# Patient Record
Sex: Female | Born: 1950 | Race: White | Hispanic: No | Marital: Married | Smoking: Never smoker
Health system: Southern US, Community
[De-identification: ages and names within clinical notes are randomized; demographics above are authoritative.]

## PROBLEM LIST (undated history)

## (undated) DIAGNOSIS — G5600 Carpal tunnel syndrome, unspecified upper limb: Secondary | ICD-10-CM

## (undated) DIAGNOSIS — I2510 Atherosclerotic heart disease of native coronary artery without angina pectoris: Secondary | ICD-10-CM

## (undated) DIAGNOSIS — IMO0002 Reserved for concepts with insufficient information to code with codable children: Secondary | ICD-10-CM

## (undated) DIAGNOSIS — E669 Obesity, unspecified: Secondary | ICD-10-CM

## (undated) DIAGNOSIS — Z9181 History of falling: Secondary | ICD-10-CM

## (undated) DIAGNOSIS — K7689 Other specified diseases of liver: Secondary | ICD-10-CM

## (undated) DIAGNOSIS — M810 Age-related osteoporosis without current pathological fracture: Secondary | ICD-10-CM

## (undated) DIAGNOSIS — C4490 Unspecified malignant neoplasm of skin, unspecified: Secondary | ICD-10-CM

## (undated) DIAGNOSIS — T7840XA Allergy, unspecified, initial encounter: Secondary | ICD-10-CM

## (undated) DIAGNOSIS — R269 Unspecified abnormalities of gait and mobility: Secondary | ICD-10-CM

## (undated) DIAGNOSIS — E785 Hyperlipidemia, unspecified: Secondary | ICD-10-CM

## (undated) DIAGNOSIS — D126 Benign neoplasm of colon, unspecified: Secondary | ICD-10-CM

## (undated) DIAGNOSIS — R51 Headache: Secondary | ICD-10-CM

## (undated) DIAGNOSIS — K5790 Diverticulosis of intestine, part unspecified, without perforation or abscess without bleeding: Secondary | ICD-10-CM

## (undated) DIAGNOSIS — J189 Pneumonia, unspecified organism: Secondary | ICD-10-CM

## (undated) DIAGNOSIS — G709 Myoneural disorder, unspecified: Secondary | ICD-10-CM

## (undated) DIAGNOSIS — K589 Irritable bowel syndrome without diarrhea: Secondary | ICD-10-CM

## (undated) DIAGNOSIS — K219 Gastro-esophageal reflux disease without esophagitis: Secondary | ICD-10-CM

## (undated) DIAGNOSIS — R413 Other amnesia: Secondary | ICD-10-CM

## (undated) DIAGNOSIS — K644 Residual hemorrhoidal skin tags: Secondary | ICD-10-CM

## (undated) DIAGNOSIS — F419 Anxiety disorder, unspecified: Secondary | ICD-10-CM

## (undated) DIAGNOSIS — H6692 Otitis media, unspecified, left ear: Secondary | ICD-10-CM

## (undated) DIAGNOSIS — M533 Sacrococcygeal disorders, not elsewhere classified: Secondary | ICD-10-CM

## (undated) DIAGNOSIS — I10 Essential (primary) hypertension: Secondary | ICD-10-CM

## (undated) DIAGNOSIS — G8929 Other chronic pain: Secondary | ICD-10-CM

## (undated) DIAGNOSIS — R011 Cardiac murmur, unspecified: Secondary | ICD-10-CM

## (undated) DIAGNOSIS — M1990 Unspecified osteoarthritis, unspecified site: Secondary | ICD-10-CM

## (undated) DIAGNOSIS — R519 Headache, unspecified: Secondary | ICD-10-CM

## (undated) HISTORY — DX: Other amnesia: R41.3

## (undated) HISTORY — PX: CARPAL TUNNEL RELEASE: SHX101

## (undated) HISTORY — DX: Essential (primary) hypertension: I10

## (undated) HISTORY — DX: Hyperlipidemia, unspecified: E78.5

## (undated) HISTORY — DX: Diverticulosis of intestine, part unspecified, without perforation or abscess without bleeding: K57.90

## (undated) HISTORY — DX: Allergy, unspecified, initial encounter: T78.40XA

## (undated) HISTORY — DX: Irritable bowel syndrome, unspecified: K58.9

## (undated) HISTORY — DX: Atherosclerotic heart disease of native coronary artery without angina pectoris: I25.10

## (undated) HISTORY — DX: Other chronic pain: G89.29

## (undated) HISTORY — DX: Unspecified osteoarthritis, unspecified site: M19.90

## (undated) HISTORY — DX: Headache: R51

## (undated) HISTORY — DX: Reserved for concepts with insufficient information to code with codable children: IMO0002

## (undated) HISTORY — DX: Myoneural disorder, unspecified: G70.9

## (undated) HISTORY — DX: Obesity, unspecified: E66.9

## (undated) HISTORY — DX: Gastro-esophageal reflux disease without esophagitis: K21.9

## (undated) HISTORY — DX: Carpal tunnel syndrome, unspecified upper limb: G56.00

## (undated) HISTORY — DX: History of falling: Z91.81

## (undated) HISTORY — DX: Residual hemorrhoidal skin tags: K64.4

## (undated) HISTORY — DX: Benign neoplasm of colon, unspecified: D12.6

## (undated) HISTORY — DX: Other specified diseases of liver: K76.89

## (undated) HISTORY — DX: Age-related osteoporosis without current pathological fracture: M81.0

## (undated) HISTORY — DX: Cardiac murmur, unspecified: R01.1

## (undated) HISTORY — DX: Anxiety disorder, unspecified: F41.9

## (undated) HISTORY — DX: Headache, unspecified: R51.9

## (undated) HISTORY — DX: Unspecified abnormalities of gait and mobility: R26.9

## (undated) HISTORY — PX: LAPAROSCOPY: SHX197

## (undated) HISTORY — DX: Unspecified malignant neoplasm of skin, unspecified: C44.90

## (undated) HISTORY — PX: SKIN SURGERY: SHX2413

## (undated) HISTORY — DX: Pneumonia, unspecified organism: J18.9

---
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
## 1998-06-29 ENCOUNTER — Ambulatory Visit (HOSPITAL_COMMUNITY): Admission: RE | Admit: 1998-06-29 | Discharge: 1998-06-29 | Payer: Self-pay | Admitting: Obstetrics and Gynecology

## 1999-06-16 ENCOUNTER — Other Ambulatory Visit: Admission: RE | Admit: 1999-06-16 | Discharge: 1999-06-16 | Payer: Self-pay | Admitting: Obstetrics and Gynecology

## 1999-09-08 ENCOUNTER — Other Ambulatory Visit: Admission: RE | Admit: 1999-09-08 | Discharge: 1999-09-08 | Payer: Self-pay | Admitting: Obstetrics and Gynecology

## 1999-09-13 ENCOUNTER — Encounter: Payer: Self-pay | Admitting: Family Medicine

## 1999-09-13 ENCOUNTER — Ambulatory Visit (HOSPITAL_COMMUNITY): Admission: RE | Admit: 1999-09-13 | Discharge: 1999-09-13 | Payer: Self-pay | Admitting: Family Medicine

## 1999-09-18 ENCOUNTER — Encounter: Payer: Self-pay | Admitting: Cardiology

## 1999-09-18 ENCOUNTER — Ambulatory Visit (HOSPITAL_COMMUNITY): Admission: RE | Admit: 1999-09-18 | Discharge: 1999-09-18 | Payer: Self-pay | Admitting: Cardiology

## 1999-09-21 ENCOUNTER — Ambulatory Visit (HOSPITAL_COMMUNITY): Admission: RE | Admit: 1999-09-21 | Discharge: 1999-09-21 | Payer: Self-pay | Admitting: Family Medicine

## 1999-09-21 ENCOUNTER — Encounter: Payer: Self-pay | Admitting: Family Medicine

## 1999-10-10 ENCOUNTER — Encounter: Payer: Self-pay | Admitting: Neurosurgery

## 1999-10-10 ENCOUNTER — Ambulatory Visit (HOSPITAL_COMMUNITY): Admission: RE | Admit: 1999-10-10 | Discharge: 1999-10-10 | Payer: Self-pay | Admitting: Neurosurgery

## 1999-11-10 ENCOUNTER — Encounter: Payer: Self-pay | Admitting: Family Medicine

## 1999-11-10 ENCOUNTER — Ambulatory Visit (HOSPITAL_COMMUNITY): Admission: RE | Admit: 1999-11-10 | Discharge: 1999-11-10 | Payer: Self-pay | Admitting: Family Medicine

## 2000-03-25 ENCOUNTER — Ambulatory Visit (HOSPITAL_COMMUNITY): Admission: RE | Admit: 2000-03-25 | Discharge: 2000-03-25 | Payer: Self-pay | Admitting: Family Medicine

## 2000-03-25 ENCOUNTER — Encounter: Payer: Self-pay | Admitting: Family Medicine

## 2000-08-06 ENCOUNTER — Other Ambulatory Visit: Admission: RE | Admit: 2000-08-06 | Discharge: 2000-08-06 | Payer: Self-pay | Admitting: Obstetrics and Gynecology

## 2000-09-30 ENCOUNTER — Ambulatory Visit (HOSPITAL_COMMUNITY): Admission: RE | Admit: 2000-09-30 | Discharge: 2000-09-30 | Payer: Self-pay | Admitting: Family Medicine

## 2000-09-30 ENCOUNTER — Encounter: Payer: Self-pay | Admitting: Family Medicine

## 2001-12-16 ENCOUNTER — Other Ambulatory Visit: Admission: RE | Admit: 2001-12-16 | Discharge: 2001-12-16 | Payer: Self-pay | Admitting: Obstetrics and Gynecology

## 2002-02-09 ENCOUNTER — Encounter: Payer: Self-pay | Admitting: Emergency Medicine

## 2002-02-09 ENCOUNTER — Inpatient Hospital Stay (HOSPITAL_COMMUNITY): Admission: EM | Admit: 2002-02-09 | Discharge: 2002-02-11 | Payer: Self-pay | Admitting: Emergency Medicine

## 2002-02-11 ENCOUNTER — Encounter: Payer: Self-pay | Admitting: Family Medicine

## 2002-02-17 ENCOUNTER — Encounter: Admission: RE | Admit: 2002-02-17 | Discharge: 2002-02-17 | Payer: Self-pay | Admitting: Family Medicine

## 2002-07-03 ENCOUNTER — Encounter: Admission: RE | Admit: 2002-07-03 | Discharge: 2002-10-01 | Payer: Self-pay | Admitting: Otolaryngology

## 2002-11-26 ENCOUNTER — Encounter: Payer: Self-pay | Admitting: Neurology

## 2002-11-26 ENCOUNTER — Ambulatory Visit (HOSPITAL_COMMUNITY): Admission: RE | Admit: 2002-11-26 | Discharge: 2002-11-26 | Payer: Self-pay | Admitting: Neurology

## 2003-03-23 ENCOUNTER — Encounter: Payer: Self-pay | Admitting: Obstetrics and Gynecology

## 2003-03-23 ENCOUNTER — Ambulatory Visit (HOSPITAL_COMMUNITY): Admission: RE | Admit: 2003-03-23 | Discharge: 2003-03-23 | Payer: Self-pay | Admitting: Obstetrics and Gynecology

## 2003-07-02 ENCOUNTER — Encounter (INDEPENDENT_AMBULATORY_CARE_PROVIDER_SITE_OTHER): Payer: Self-pay | Admitting: *Deleted

## 2003-07-02 ENCOUNTER — Ambulatory Visit (HOSPITAL_COMMUNITY): Admission: RE | Admit: 2003-07-02 | Discharge: 2003-07-02 | Payer: Self-pay | Admitting: Internal Medicine

## 2003-09-07 ENCOUNTER — Encounter: Admission: RE | Admit: 2003-09-07 | Discharge: 2003-10-05 | Payer: Self-pay | Admitting: Orthopedic Surgery

## 2004-11-27 ENCOUNTER — Inpatient Hospital Stay (HOSPITAL_COMMUNITY): Admission: EM | Admit: 2004-11-27 | Discharge: 2004-11-30 | Payer: Self-pay | Admitting: Emergency Medicine

## 2004-11-27 ENCOUNTER — Ambulatory Visit: Payer: Self-pay | Admitting: Cardiology

## 2004-12-04 ENCOUNTER — Ambulatory Visit: Payer: Self-pay

## 2005-01-03 ENCOUNTER — Ambulatory Visit: Payer: Self-pay | Admitting: Cardiology

## 2005-04-06 ENCOUNTER — Ambulatory Visit: Payer: Self-pay | Admitting: Cardiology

## 2005-11-05 ENCOUNTER — Ambulatory Visit: Payer: Self-pay | Admitting: Cardiology

## 2006-02-07 ENCOUNTER — Ambulatory Visit (HOSPITAL_COMMUNITY): Admission: RE | Admit: 2006-02-07 | Discharge: 2006-02-07 | Payer: Self-pay | Admitting: Obstetrics and Gynecology

## 2006-05-02 ENCOUNTER — Ambulatory Visit: Payer: Self-pay | Admitting: Cardiology

## 2007-03-12 ENCOUNTER — Emergency Department (HOSPITAL_COMMUNITY): Admission: EM | Admit: 2007-03-12 | Discharge: 2007-03-12 | Payer: Self-pay | Admitting: Emergency Medicine

## 2007-04-21 ENCOUNTER — Encounter: Admission: RE | Admit: 2007-04-21 | Discharge: 2007-05-19 | Payer: Self-pay | Admitting: Orthopaedic Surgery

## 2007-07-09 ENCOUNTER — Ambulatory Visit: Payer: Self-pay | Admitting: Cardiology

## 2008-10-19 ENCOUNTER — Ambulatory Visit: Payer: Self-pay | Admitting: Cardiology

## 2008-12-02 ENCOUNTER — Ambulatory Visit: Payer: Self-pay | Admitting: Internal Medicine

## 2008-12-15 ENCOUNTER — Ambulatory Visit: Payer: Self-pay | Admitting: Internal Medicine

## 2008-12-15 HISTORY — PX: COLONOSCOPY: SHX5424

## 2009-04-11 ENCOUNTER — Encounter: Payer: Self-pay | Admitting: Cardiology

## 2009-09-19 ENCOUNTER — Encounter: Payer: Self-pay | Admitting: Cardiology

## 2010-01-04 ENCOUNTER — Encounter: Admission: RE | Admit: 2010-01-04 | Discharge: 2010-01-04 | Payer: Self-pay | Admitting: Internal Medicine

## 2010-01-04 ENCOUNTER — Encounter: Payer: Self-pay | Admitting: Cardiology

## 2010-01-04 ENCOUNTER — Encounter: Payer: Self-pay | Admitting: Internal Medicine

## 2010-01-05 ENCOUNTER — Encounter: Payer: Self-pay | Admitting: Internal Medicine

## 2010-01-09 ENCOUNTER — Encounter (INDEPENDENT_AMBULATORY_CARE_PROVIDER_SITE_OTHER): Payer: Self-pay | Admitting: *Deleted

## 2010-02-08 ENCOUNTER — Ambulatory Visit: Payer: Self-pay | Admitting: Internal Medicine

## 2010-02-28 ENCOUNTER — Telehealth: Payer: Self-pay | Admitting: Internal Medicine

## 2010-03-07 ENCOUNTER — Telehealth: Payer: Self-pay | Admitting: Internal Medicine

## 2010-03-27 ENCOUNTER — Encounter: Payer: Self-pay | Admitting: Internal Medicine

## 2010-04-05 ENCOUNTER — Encounter: Payer: Self-pay | Admitting: Cardiology

## 2010-04-06 ENCOUNTER — Encounter: Admission: RE | Admit: 2010-04-06 | Discharge: 2010-04-06 | Payer: Self-pay | Admitting: Family Medicine

## 2010-04-14 ENCOUNTER — Encounter: Admission: RE | Admit: 2010-04-14 | Discharge: 2010-04-14 | Payer: Self-pay | Admitting: Family Medicine

## 2010-07-28 ENCOUNTER — Encounter: Payer: Self-pay | Admitting: Cardiology

## 2011-01-16 ENCOUNTER — Encounter: Payer: Self-pay | Admitting: Cardiology

## 2011-01-18 NOTE — Assessment & Plan Note (Signed)
Summary: TENDER L SIDE X2WKS/YF    History of Present Illness Visit Type: Initial Consult Primary GI MD: Stan Head MD [facility] Primary Provider: [name], MD Requesting Provider: [name], MD Chief Complaint: LUQ abdominal pain/hx of IBS History of Present Illness:   L flank, rib and LUQ pain It was very tender when examined. Sore and tender. Denies trauma. When diving it feels like a pressure the size of a grapefruit in LUQ, it is presistent since at least December 2010. It makes it difficult to breathe When she stands or moves its better than when she sits. Disturbs sleep. Not related to bowels, though at onset she had some diffiulty with straining to stool (Sept/Oct 2010). Chest and abd CT with pulmonary nodule but otherwise negative. Tylenol has not helped, no other Tx tried. No prior similar problems though she remembers right-sided pain that responded to appendectomy (ahesed to intestine)    GI Review of Systems    Reports abdominal pain, acid reflux, and  belching.     Location of  Abdominal pain: LUQ.    Denies bloating, chest pain, dysphagia with liquids, dysphagia with solids, heartburn, loss of appetite, nausea, vomiting, vomiting blood, weight loss, and  weight gain.      Reports change in bowel habits, constipation, diarrhea, and  irritable bowel syndrome.     Denies anal fissure, black tarry stools, diverticulosis, fecal incontinence, heme positive stool, hemorrhoids, jaundice, light color stool, liver problems, rectal bleeding, and  rectal pain. Preventive Screening-Counseling & Management  Alcohol-Tobacco     Smoking Status: never      Drug Use:  no.      Current Medications (verified): 1)  Lovaza 1 Gm Caps (Omega-3-Acid Ethyl Esters) .... Take 2 Capsules By Mouth Two Times A Day 2)  Crestor 40 Mg Tabs (Rosuvastatin Calcium) .... 1/2 Tablet By Mouth Once Daily 3)  Hydrochlorothiazide 25 Mg Tabs (Hydrochlorothiazide) .... 1/2 Tablet By Mouth Once  Daily 4)  Lovaza 1 Gm Caps (Omega-3-Acid Ethyl Esters) .... 2 By Mouth in The Morning and 2 At Bedtime 5)  Fexofenadine Hcl 180 Mg Tabs (Fexofenadine Hcl) .Marland Kitchen.. 1 Tablet By Mouth Once Daily 6)  Nasonex 50 Mcg/act Susp (Mometasone Furoate) .... Use As Directed 7)  Aspirin 81 Mg Tabs (Aspirin) .Marland Kitchen.. 1 Tablet By Mouth Once Daily 8)  Vitamins A & D 5000-400 Unit Caps (Vitamins A & D) .Marland Kitchen.. 1 Tablet  By Mouth Once Daily 9)  Amlodipne ? of Strength .... 1 Tablet By Mouth Once Daily  Allergies (verified): 1)  ! Penicillin  Past History:  Past Medical History: Arthritis Chronic Headaches Adenomatous Colon Polyps Coronary Artery Disease Hyperlipidemia Hypertension Diverticulosis Irritable Bowel Syndrome Obesity  Past Surgical History: Reviewed history from 02/08/2010 and no changes required. Knee Surgery-Right Carpal Tunnel Release  Family History: Reviewed history and no changes required. Family History of Prostate Cancer:father Family History of Colon Cancer:father Family History of Kidney Disease: father  Social History: Reviewed history and no changes required. 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  Alcohol Use - no Daily Caffeine Use Illicit Drug Use - no Smoking Status:  never Drug Use:  no  Review of Systems       The patient complains of allergy/sinus, arthritis/joint pain, back pain, urination - excessive, urine leakage, and vision changes.    Vital Signs:  Patient profile:   60 year old female Height:      68 inches Weight:      182 pounds  BMI:     27.77 Pulse rhythm:   regular BP sitting:   126 / 68  (left arm)  Vitals Entered By: Milford Cage NCMA (February 08, 2010 3:22 PM)  Physical Exam  General:  Well developed, well nourished, no acute distress. Eyes:  no icterus. Chest Wall:  very tender left lateral and inferir ribs other chest wall ok Lungs:  Clear throughout to auscultation.decreased BS bilateral.   Heart:  Regular rate  and rhythm; no murmurs, rubs,  or bruits. Abdomen:  some LUQ tenderness, mild,mostly ribs no pain with muscle tension Extremities:  no edema   Impression & Recommendations:  Problem # 1:  RIB PAIN, LEFT SIDED (ICD-786.50) Assessment New She is tender here, the CT is unremarkable and does not give a cause nor does her chest x-ray. All these features point towards benign but painful musculoskeletal pain in the rib area. Will treat with Naprosyn 500 mg b.i.d., heating pad, and Vicodin at night to help pain and allow her to sleep. She was warned about possible side effects including constipation. She is to call in followup in about 2 weeks to see how she is doing. I reassured her as best I could. I don't think any other invasive testing or noninvasive testing is needed at this time but will see how she does.  Problem # 2:  ABDOMINAL PAIN-LUQ (ICD-789.02) Assessment: New This is mainly related to the rib pain just above this area, and I think it's an associated finding and we'll observe for response to treatment as described under left rib pain. She is going to try nonsteroidals, heat and  p.r.n. Vicodin.  Patient Instructions: 1)  Please pick up your medications at your pharmacy. Naprosyn, Vicodin 2)  You may use a heating pad applied to your abdominal wall as needed. 3)  Please call our office in 2 weeks with an update on your condition. 4)  Copy sent to : [name], MD 5)  The medication list was reviewed and reconciled.  All changed / newly prescribed medications were explained.  A complete medication list was provided to the patient / caregiver. Prescriptions: HYDROCODONE-ACETAMINOPHEN 5-500 MG TABS (HYDROCODONE-ACETAMINOPHEN) 1 by mouth every 4 hours as needed for pain  #30 x 0   Entered and Authorized by:   Iva Boop MD, [facility]   Signed by:   Iva Boop MD, [facility] on 02/08/2010   Method used:   Printed then faxed to ...       [facility] [phone]* (retail)       [address] [place], [place]  [address]       Ph: [phone] or [phone]       Fax: [phone]   RxID:   [id number] NAPROSYN 500 MG TABS (NAPROXEN) 1 by mouth two times a day  #60 x 0   Entered and Authorized by:   Iva Boop MD, [facility]   Signed by:   Iva Boop MD, [facility] on 02/08/2010   Method used:   Electronically to        [facility] [phone]* (retail)       [address] [place], [place]  [address]       Ph: [phone] or [phone]       Fax: [phone]   RxID:  [id number]  

## 2011-01-18 NOTE — Letter (Signed)
Summary: New Patient letter  [facility]  [address] [place], [place] [address]   Phone: [phone]  Fax: [phone]       01/09/2010 MRN: [id number]  [facility] [address] [place], [place]  [address]  Dear [name],  Welcome to the Gastroenterology Division at [facility].    You are scheduled to see Dr.  Leone Payor on 02-06-10 at 3pm on the 3rd floor at [facility], [address]  We ask that you try to arrive at our office 15 minutes prior to your appointment time to allow for check-in.  We would like you to complete the enclosed self-administered evaluation form prior to your visit and bring it with you on the day of your appointment.  We will review it with you.  Also, please bring a complete list of all your medications or, if you prefer, bring the medication bottles and we will list them.  Please bring your insurance card so that we may make a copy of it.  If your insurance requires a referral to see a specialist, please bring your referral form from your primary care physician.  Co-payments are due at the time of your visit and may be paid by cash, check or credit card.     Your office visit will consist of a consult with your physician (includes a physical exam), any laboratory testing he/she may order, scheduling of any necessary diagnostic testing (e.g. x-ray, ultrasound, CT-scan), and scheduling of a procedure (e.g. Endoscopy, Colonoscopy) if required.  Please allow enough time on your schedule to allow for any/all of these possibilities.    If you cannot keep your appointment, please call [phone] to cancel or reschedule prior to your appointment date.  This allows [place] the opportunity to schedule an appointment for another patient in need of care.  If you do not cancel or reschedule by 5 p.m. the business day prior to your appointment date, you will be charged a $50.00 late cancellation/no-show fee.    Thank you for choosing [facility] for your medical needs.  We appreciate the opportunity to care for you.  Please visit [place] at our website  to learn more about our practice.                     Sincerely,                                                             The Gastroenterology Division

## 2011-01-18 NOTE — Progress Notes (Signed)
Summary: follow-up call   Phone Note Outgoing Call   Summary of Call: she was to call back ? how is she with pain Iva Boop MD, [facility]  February 28, 2010 9:23 PM   Follow-up for Phone Call        Message left for pt. to please call back with update on her abd. pain.   Teryl Lucy RN  March 01, 2010 8:51 AM  Left message for patient to call back Darcey Nora RN, [facility]  March 01, 2010 2:45 PM  Patient has no improvement in her pain.  She is taking Naproxyn two times a day and requiring Vicodin 2 daily for the last week.  Please advise.    Follow-up by: Darcey Nora RN, CGRN,  March 01, 2010 4:10 PM  Additional Follow-up for Phone Call Additional follow up Details #1::        stay with the Naprosyn for 1 month total - may not have been enough time and then we need to call her or she needs to call us Iva Boop MD, [facility]  March 03, 2010 11:08 AM     Additional Follow-up for Phone Call Additional follow up Details #2::    Patient  advised. Follow-up by: Darcey Nora RN, CGRN,  March 03, 2010 11:17 AM

## 2011-01-18 NOTE — Progress Notes (Signed)
Summary: Symptom update  Phone Note Outgoing Call   Call placed by: Francee Piccolo CMA [facility] Dull),  March 07, 2010 12:13 PM Call placed to: Patient Summary of Call: Refill request received for Naproxen.  Per last phone note pt to follow up with symptom phone call.  Message left for pt to return call. Francee Piccolo CMA [facility] Dull)  March 07, 2010 12:15 PM   Follow-up for Phone Call        Pt states she did not request refill-could have been automatic from pharm.  She has 3-4 days of Naproxen left.  Pt states she is having the same pain that feels like something is "mashing" on her left side.  She can still feel a mass on that side.  She states her pain is worse while sitting or driving.  Does pt need REV?  What is next step? Follow-up by: Francee Piccolo CMA [facility] Dull),  March 08, 2010 10:54 AM  Additional Follow-up for Phone Call Additional follow up Details #1::        I would like her to see Dr. Wynn Banker or Riley Kill from Physical Medicen/Rehab (Pain eval also) She has had a CT that is negative and features suggest musculoskeletal or perhaps spine Additional Follow-up by: Iva Boop MD, [facility],  March 09, 2010 1:14 PM    Additional Follow-up for Phone Call Additional follow up Details #2::    message left to return call. Francee Piccolo CMA [facility] Dull)  March 16, 2010 10:49 AM   RC from pt.  I advised her of Dr. Marvell Fuller recommendation.  She is agreeable to that plan, but she would like to speak with Dr. Christell Constant first and get his opinion.  Pt has appt with Dr. Christell Constant on 04/05/10.  Pt requests a copy of our notes to be faxed to Dr. Kathi Der office so they can discuss at her next visit. Follow-up by: Francee Piccolo CMA [facility] Dull),  March 20, 2010 10:02 AM  Additional Follow-up for Phone Call Additional follow up Details #3:: Details for Additional Follow-up Action Taken: ok will do and will send a letter/note to Dr. Christell Constant also Additional Follow-up by: Iva Boop MD, [facility],  March 20, 2010  10:06 AM

## 2011-01-18 NOTE — Letter (Signed)
Summary: Letter to [facility]  [address] [place], [place] [address]   Phone: [phone]  Fax: [phone]    03/27/2010  [name], MD [facility] [place], [place]  RE:    Shelly Bennett   [address]   [place], [place]  [address]  Dear Roe Coombs,  Shelly Bennett has persistent LUQ pain that seems to be musculoskeletal in origin, to me. I have suggested that she a physical medicine physician for assessment and possible therapy. it seems like abdominal wall pain to me, though she did not seem to respond to NSAID.   I do not think further GI work-up is needed at this time. She has been told and is seeing you later this month to discuss my recommendations. Please let me know if you have any other thoughts or questions.   Sincerely,  Ileene Hutchinson MD, [facility]

## 2011-05-01 NOTE — Assessment & Plan Note (Signed)
[facility]                            CARDIOLOGY OFFICE NOTE   Shelly, Bennett                   MRN:          [id number]  DATE:10/19/2008                            DOB:          Mar 05, 1951    Shelly Bennett is in for a followup visit.  In general, she is doing  reasonably well.  She has just had 4th wedding, so all of her children  are now married.  Prior to this, she was doing zumba just about every  day as her husband walked on the exercise treadmill nearby.  She has  remained generally active.  This very nice lady does not smoke.  She  denies current chest pain.   Her current medications include aspirin 81 mg daily, Allegra 180 mg  daily, Lotrel 10 mg daily, hydrochlorothiazide 12.5 daily, Nasonex  q.a.m., atenolol 25 mg daily, Crestor 40 mg daily, Lavaza 2 p.o. b.i.d.,  omeprazole 20 mg daily, and recently Dr. Christell Constant placed her on Lexapro.   PHYSICAL EXAMINATION:  She is alert and oriented female in no acute  distress.  Her weight is up from 207 to 215, it is down from 222 in  2006.  There are no carotid bruits noted.  The lung fields are clear to  auscultation and percussion.  There is no murmur, rub, or gallop noted  as well.   The electrocardiogram demonstrates normal sinus rhythm, essentially  within normal limits.   IMPRESSION:  1. Known coronary artery disease with 50-70% mid-LAD stenosis, 30%      diagonal, and 20-30% ostial circumflex with ejection fraction      greater than 60%.  2. Well-defined hypercholesterolemia with aggressive medical therapy      per Dr. Vernon Bennett.  3. History of mild diverticulosis with hemorrhoids  4. Prior carpal tunnel surgery.  5. Prior knee and ankle surgery.  6. History of abdominal laparoscopic surgery.  7. Hypertension.   RECOMMENDATIONS:  1. Continue current medical regimen.  2. Continued exercise with prior already weight loss.  3. Return to clinic in 1 year's time earlier with  symptoms.     Shelly Bennett. Shelly Kill, MD, [facility]  Electronically Signed    TDS/MedQ  DD: 10/20/2008  DT: 10/20/2008  Job #: [id number]   cc:   Shelly Bennett, M.D.

## 2011-05-01 NOTE — Letter (Signed)
July 09, 2007    Ernestina Penna, M.D.  [address] [place], [place] [address]   RE:  KAILLY, RICHOUX  MRN:  [id number]  /  DOB:  Jun 23, 1951   Dear Roe Coombs,   I had the pleasure of seeing Mrs. Piscopo in the office today in  followup.  As you know, she was having some difficulty with sleep as  well as perhaps an occasional episode of chest tightness.  She has not  been exercising on a regular basis, and she shared with me today that  she thought a lot of this was related to the stress surrounding the  wedding.  Some of this has abated, but she and Lauren are particularly  close, and she shared with me that she felt that the ordeal was really  quite stressful.  She mentioned that you had mentioned the possibility  of considering Bystolic as opposed to atenolol for her management, and I  think this would really be quite reasonable.  A repeat of her lipid  profile done recently in your office did reveal an LDL particle, #1548,  small LDL particle number of 1281, an LDLC of 105, and the LDL particle  size of 20.2.  Her hemoglobin was normal, and her TSH was normal as  well.   PHYSICAL EXAMINATION:  VITAL SIGNS:  The patient 130/71, weight 209  pounds, pulse 61.  LUNG FIELDS:  Clear.  CARDIAC:  Regular, without a significant murmur noted.   The patient's electrocardiogram demonstrated normal sinus rhythm and  essentially was within normal limits.   I have strongly encouraged her to resume her walking program.  I think  this will be particularly important.  We discussed its role as a stress  reliever as well as having the potential to improve her lipids, diet,  and weight.  She has gradually lost weight over time, and this has been  particularly helpful.  With regard to her lipids, it would be  appropriate to consider increasing her  Crestor to 40 mg as a potential treatment plan.  I would have no  reservations about shifting her to Bystolic as an alternative.   We will be happy  to see her back in followup in 6 months' time, and I  know she is scheduled to see you in the near future.    Sincerely,      Arturo Morton. Riley Kill, MD, [facility]  Electronically Signed    TDS/MedQ  DD: 07/19/2007  DT: 07/19/2007  Job #: [id number]

## 2011-05-04 NOTE — Discharge Summary (Signed)
NAME:  Shelly Bennett, Shelly Bennett NO.:  [id number]   MEDICAL RECORD NO.:  [id number]          PATIENT TYPE:  INP   LOCATION:  3730                         FACILITY:  [facility]   PHYSICIAN:  Rollene Rotunda, M.D.   DATE OF BIRTH:  Sep 24, 1951   DATE OF ADMISSION:  11/27/2004  DATE OF DISCHARGE:  11/30/2004                           DISCHARGE SUMMARY - REFERRING   REASON FOR ADMISSION:  Ms. Garmon is a 60 year old female with no known  history of coronary artery disease who presents with chest pain in the  context of multiple cardiac risk factors.  Please refer to dictated  admission note for full details.   LABORATORY DATA:  Normal CBC.  Normal electrolytes and renal function.  Cardiac enzymes:  Normal CPK-MB and troponin markers (x3).  Liver profile:  Total cholesterol 154, triglycerides 113, HDL 46, LDL 85 (cholesterol/HDL  ratio 2.3).  TSH 2.15.  Urinalysis negative.  Hemoccult negative.  D-dimer  0.22.   Admission chest x-ray:  No acute disease.  Abdominal ultrasound:  Normal.   HOSPITAL COURSE:  Patient ruled out for myocardial infarction with all  serial cardiac markers within normal limits.   Cardiac catheterization performed the following day by Dr. Bonnee Quin (see  report for full details) revealed noncritical coronary artery disease with a  50-70% proximal LAD lesion at the diagonal bifurcation.  There was otherwise  nonobstructive residual proximal circumflex disease and a normal right  coronary artery.  Left ventricular function was normal with no wall motion  abnormalities.   Dr. Riley Kill reviewed the anatomy with Dr. Gerri Spore and plan is to proceed  with outpatient stress test for assessment of the LAD lesion.  If positive  for ischemia in this territory or patient has recurrent pain then plan is to  have patient return for elective percutaneous intervention.  If the  perfusion images are negative the continued medical therapy is recommended.   Patient also  had an abdominal ultrasound for evaluation of right upper  quadrant discomfort on examination.  This was normal.  Of note, liver  enzymes were also normal on admission.   Patient was kept for overnight observation and cleared for discharge by Dr.  Riley Kill on hospital day #3.   Of note, at time of discharge Dr. Riley Kill indicated that he reviewed the  films with Dr. Juanda Chance who graded the LAD lesion at 40-50%.  The consensus  agreement, therefore, was for continued medical therapy with the follow-up  stress test.   DISCHARGE MEDICATIONS:  1.  Plavix 75 mg daily.  2.  Aspirin 81 mg daily.  3.  Allegra 180 mg daily.  4.  Lotrel 10 mg daily.  5.  Hydrochlorothiazide 12.5 mg daily.  6.  Aciphex 30 mg daily.  7.  Vytorin 10/40 mg daily.   INSTRUCTIONS:  Maintain low fat/cholesterol diet.  Call the office if there  is any swelling/bleeding of the groin.  No strenuous activity/heavy lifting.   Patient will be scheduled for a stress test on Monday, December 19 with  arrangements made by Dr. Bonnee Quin.   DISCHARGE DIAGNOSES:  1.  Single vessel coronary artery  disease.      1.  Moderate proximal left anterior descending disease by cardiac          catheterization December 13.      2.  Normal left ventricular function.      3.  Normal serial cardiac markers.  2.  Hypertension.  3.  Dyslipidemia.  4.  Gastroesophageal reflux disease.       GS/MEDQ  D:  11/30/2004  T:  11/30/2004  Job:  [id number]   cc:   Ernestina Penna, M.D.  [address] [place]  [place] [address]  Fax: [phone]

## 2011-05-04 NOTE — Discharge Summary (Signed)
[place]. [facility]  Patient:    Shelly Bennett, Shelly Bennett Visit Number: [id number] MRN: [id number]          Service Type: MED Location: 5500 [id number] Attending Physician:  McDiarmid, Leighton Roach. Dictated by:   Mont Dutton, M.D. Admit Date:  02/09/2002 Discharge Date: 02/11/2002   CC:         Madolyn Frieze. Jens Som, M.D. [facility]  Dr. Christell Constant, [facility]   Discharge Summary  DATE OF BIRTH:  1951-05-12  ADMISSION DIAGNOSES: 1. Chest pain. 2. Heart murmur.  DISCHARGE DIAGNOSES: 1. Chest pain, resolved. 2. Hypertension. 3. Hypercholesterolemia. 4. Irritable bowel syndrome.  CONSULTATION:  [facility].  PROCEDURES: 1. Echocardiogram on February 10, 2002, which showed excellent left    ventricular function with ejection fraction greater than 65%, no obvious    left ventricular outflow tract gradient, some left ventricular hypertrophy,    mildly increased thickness of aortic valve and atrial size at slightly    upper limits of normal, left ventricular wall thickness mildly increased. 2. Exercise stress test with Cardiolite on February 11, 2002, was clinically    negative. 3. Electrocardiogram with ejection fraction 65% and no ischemia with scar,    negative Cardiolite.  HISTORY OF PRESENT ILLNESS:  The patient is a 60 year old female with a history of hypertension who presented with heaviness in her chest on February 09, 2002, that had begun at 10 p.m. the night prior with tight, squeezing type pain causing some dyspnea, nausea, however, no diaphoresis.  She says she gets this pain when she is under stress and also some pain/pressure with aerobics, but not consistently.  She says that this pain has been increasing in frequency and intensity over the last month.  It was relieved with nitroglycerin with history of chest tingling and tightness with a full workup up by [facility] which was negative at that time.  The  patient also has a history of heartburn and has been taking Tums for this problem.  PHYSICAL EXAMINATION:  CARDIAC:  2/6 systolic murmur that radiated to carotids.  VITAL SIGNS:  Admission BP of 191/89.  LABORATORY DATA AND X-RAY FINDINGS:  CBC with white blood cells 8.2, hemoglobin 12.7, hematocrit 37.1, platelets 309.  Sodium 138, potassium 3.6, chloride 106, bicarb 24, BUN 10, creatinine 0.7, glucose 119, calcium 8.6. CK with CK-MB three sets completed in hospital.  The first CK was 233, 169, and 145; MB 4.9, 3.5 and 2.5 respectively; troponins 0.02, 0.02 and 0.01.  Two EKGs were obtained which did not show any changes.  ASSESSMENT/PLAN:  The patient was admitted for rule out MI.  She was placed on Protonix, Celebrex, Allegra, Tylenol, nitroglycerin and Ambien for sleep. Cholesterol panel was obtained which reflected a total cholesterol of 298. Lipid panel was pending at the time of discharge.  HOSPITAL COURSE:  #1 - CHEST PAIN:  The chest pain resolved after the first day of hospitalization.  However, since the chest pain was present throughout the first day of hospitalization, the cardiologist was consulted.  Cardiology decided to line the patient up for a Cardiolite as well as echocardiogram given her murmur on physical exam that she did not remember having in the past.  Electrocardiogram just showed some mild left ventricular thickening as well as mild aortic valve thickening, otherwise EF greater than 65% and overall left ventricular function was excellent.  The patients chest pain had resolved and hospital enzymes are negative.  Cardiolite stress test was  negative.  She was cleared from cardiology from that standpoint.  #2 - HYPERTENSION:  The patients blood pressure was elevated throughout hospitalization up into the 170s systolic.  She was started on Norvasc 5 mg which was increased to 10 mg q.d. as well as started on hydrochlorothiazide 10 mg p.o. q.d.  #3 -  HYPERCHOLESTEROLEMIA:  The patients total cholesterol was elevated at 298.  Will start Zocor 40 mg p.o. q.d. on discharge per recommendations of cardiology.  Also, at the recommendation of cardiology, is that this patient does have a family history of heart disease.  Her mother has coronary artery disease and cardiology wanted her to follow up with them.  The patient was discharged on February 11, 2002.  DISCHARGE MEDICATIONS: 1. Zocor 40 mg tablet p.o. q.d. 2. Norvasc 10 mg tablet p.o. q.d. 3. HCTZ 12.5 mg p.o. q.d. 4. Enteric coated aspirin 325 mg one tablet p.o. q.d. 5. Resume other medications with the exception of Norvasc for which dose was    changed as previously listed.  ACTIVITY:  No restrictions.  DIET:  Low sodium diet.  FOLLOWUP:  She is scheduled for follow-up appointment with Dr. Jens Som at [facility] on Wednesday, April 2, at noon.  Appointment with Dr. Christell Constant at [facility] on February 19, 2002, at 11:30 a.m.  CONDITION ON DISCHARGE:  Stable.  Dictated by:   Mont Dutton, M.D.  Attending Physician:  McDiarmid, Tawanna Cooler D. DD:  02/11/02 TD:  02/12/02 Job: [id number]

## 2011-05-04 NOTE — Consult Note (Signed)
[place]. [facility]  Patient:    Shelly Bennett, Shelly Bennett Visit Number: [id number] MRN: [id number]          Service Type: MED Location: 5500 [id number] Attending Physician:  McDiarmid, Leighton Roach. Dictated by:   Madolyn Frieze. Jens Som, M.D. [facility] Proc. Date: 02/09/02 Admit Date:  02/09/2002                            Consultation Report  HISTORY:  The patient is a 60 year old female with a past medical history of hypertension, gastroesophageal reflux disease, migraine headaches, and irritable bowel syndrome who we are asked to evaluate for chest pain.  The patient apparently had a nuclear study in our office approximately two years ago that showed no significant abnormality but I do not have those records available.  The patient states that for the past three weeks she has had occasional chest pain in a substernal location that does not radiate.  It is described as a pressure sensation.  There is no associated nausea or vomiting but there is mild shortness of breath.  The pain is not pleuritic and not related to food.  It is not purely exertional.  It does increase when she moves in certain ways, in particular with sitting forward.  Her pain initially began approximately 10 p.m. on February 08, 2002.  It has persisted throughout the day today and now is at a mild level.  Because of her symptoms she was admitted by the family practice service and we were asked to further evaluate. Of note, her initial enzymes are negative and her electrocardiogram shows no ST changes.  She denies any recent travel and there has been no hemoptysis.  PRESENT MEDICATIONS: 1. Aspirin 81 mg p.o. q.d. 2. Librium p.r.n. 3. Allegra. 4. Celebrex. 5. Flonase. 6. Norvasc for hypertension.  PAST MEDICAL HISTORY:  Significant for borderline hypertension and hyperlipidemia, but no diabetes mellitus.  There is a history of gastroesophageal reflux disease as well as irritable bowel syndrome.  She  has a history of migraines.  She has a history of knee and ankle surgery.  There is a history of carpal tunnel.  FAMILY HISTORY:  Positive for coronary artery disease in her mother.  SOCIAL HISTORY:  She does not smoke nor does she consume alcohol.  REVIEW OF SYSTEMS:  She denies any recent headaches.  There is no fevers or chills and no productive cough.  No hemoptysis.  No dysphagia, odynophagia, melena, or hematochezia.  There is no dysuria, hematuria.  There is no rash or seizure activity.  There is no orthopnea, PND, or pedal edema.  She does have occasional knee pain with ambulation but there is no claudication noted.  The remaining systems are negative.  PHYSICAL EXAMINATION  VITAL SIGNS:  Blood pressure 167/95, pulse 94, respiratory rate 16.  GENERAL:  She is well-developed and somewhat obese.  She is in no acute distress.  SKIN:  Warm and dry.  HEENT:  Unremarkable with normal eyelids.  NECK:  Supple with normal upstroke bilaterally and no bruits noted.  There is no jugular venous distention and no thyromegaly noted.  CHEST:  Clear to auscultation.  Normal expansion.  CARDIOVASCULAR:  Regular rate and rhythm with normal S1, S2.  There is a 2/6 systolic ejection murmur at the left sternal border.  There is no diastolic murmur noted.  It should be noted that S2 is preserved.  ABDOMEN:  Nontender.  Standard positive  bowel sounds.  No hepatosplenomegaly. No mass appreciated.  No abdominal bruit.  She has 2+ femoral pulses bilaterally and no bruits.  EXTREMITIES:  No edema and I can palpate no cords.  She has a negative Homans bilaterally.  She has 2+ dorsalis pedis pulses bilaterally.  NEUROLOGIC:  Grossly intact.  Of note, she is tender to palpation over the chest area.  LABORATORIES:  Electrocardiogram shows a normal sinus rhythm with a normal axis.  There is T-wave inversion in aVL, but is otherwise unremarkable. Follow-up electrocardiograms are  unchanged.  White blood cell count 8.2, hemoglobin 12.7, hematocrit 37.1, platelet count 309,000.  BUN and creatinine are 10 and 0.7 respectively.  Initial enzymes are negative x2.  DIAGNOSES: 1. Atypical chest pain. 2. Borderline hypertension. 3. Borderline hyperlipidemia. 4. Systolic murmur. 5. History of gastroesophageal reflux disease. 6. History of irritable bowel syndrome.  PLAN:  The patient presents for evaluation of chest pain.  Her symptoms are atypical in nature and they do increase with certain movements.  This certainly may be musculoskeletal in etiology.  She also states that she has had increased reflux over the past three weeks and there could be a GI contribution.  I doubt ischemia as there are no electrocardiographic changes and she has normal enzymes.  We will plan to risk stratify with a stress Cardiolite on February 09, 2002.  We will also check an echocardiogram, although her systolic ejection murmur sounds to be either an ejection murmur or mild aortic stenosis.  I doubt significant aortic stenosis.  Finally, her blood pressure is elevated.  I agree with continuing the Norvasc.  We will make further recommendations once we have the above studies. Dictated by:   Madolyn Frieze. Jens Som, M.D. [facility] Attending Physician:  McDiarmid, Tawanna Cooler D. DD:  02/09/02 TD:  02/10/02 Job: [id number]

## 2011-05-04 NOTE — Cardiovascular Report (Signed)
NAME:  Shelly Bennett, FUSILIER NO.:  [id number]   MEDICAL RECORD NO.:  [id number]          PATIENT TYPE:  INP   LOCATION:  3735                         FACILITY:  [facility]   PHYSICIAN:  Arturo Morton. Riley Kill, M.D. [facility] OF BIRTH:  04-19-1951   DATE OF PROCEDURE:  11/28/2004  DATE OF DISCHARGE:                              CARDIAC CATHETERIZATION   INDICATIONS:  Shelly Bennett is a 60 year old who presents with chest pain that  started while she was up at the [facility] in [place] on Saturday.  It  seemed to be worse climbing the hills.  The discomfort was subsequently  relieved but she has had recurrent symptoms.  She had worse symptoms about  two years ago at which time she was evaluated by Dr. Jens Som.  She had no  evidence of ischemia by Cardiolite at that time.  She had mild aortic valve  thickening.  She was started on medicines for both hypertension as well as  hypercholesterolemia.  She has continued to get along well, but now has  presented with some recurrent episodes.  The patient was brought to the  catheterization laboratory for further evaluation.   PROCEDURE:  1.  Left heart catheterization.  2.  Selective coronary arteriography.  3.  Selective left ventriculography.   DESCRIPTION OF PROCEDURE:  The patient was brought to the catheterization  laboratory after informed consent and prepped and draped in the usual  fashion.  Through an anterior puncture the right femoral artery was easily  entered.  A 6-French sheath was placed.  Central aortic and left ventricular  pressures were measured with a pigtail.  Ventriculography was performed in  the RAO projection.  Views of the left and right coronary arteries were  obtained in multiple angiographic projections.  Dr. Gerri Spore and I then  reviewed the films in careful detail.  It was our combined opinion that the  patient should be treated medically, should undergo a stress Cardiolite  study and subsequent  decision making based on how she performs on the  Cardiolite unless she has continued and recurrent pain.  We took subsequent  additional views using a 6-French guiding catheter along with intracoronary  nitroglycerin administration.  There were no complications and she was taken  to the holding area in satisfactory clinical condition.   The patient was administered two 1 mg doses of intravenous Versed for  relaxation in the laboratory.   HEMODYNAMIC DATA:  1.  Central aortic pressure 145/85, mean 111.  2.  Left ventricular pressure 130/13.  3.  No gradient on pullback across the aortic valve.   ANGIOGRAPHIC DATA:  1.  Ventriculography was performed in the RAO projection.  Overall systolic      function was vigorous.  Ejection fraction exceeded 60%.  No wall motion      abnormalities were identified.  2.  The left main coronary artery was free of critical disease.  3.  There is very mild calcification noted eccentrically noted at the LAD      just after the takeoff of the large diagonal branch.  The LAD has about  a 50-70% stenosis just after the takeoff of the large diagonal branch.      The diagonal branch itself has tandem areas of about 30% narrowing prior      to its bifurcation, but no high grade areas of obstruction.  The distal      left anterior descending artery is without significant disease.  4.  The circumflex has about 20-30% narrowing in the proximal vessel.  The      vessel bifurcates into an AV circumflex and a moderate marginal branch      other than minimal luminal irregularity, no high grade areas of focal      stenosis are identified.  5.  The right coronary artery is a large caliber vessel with posterior      descending and posterolateral branch both of which are free of critical      disease.   CONCLUSIONS:  1.  Well preserved left ventricular function.  2.  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  3.  Other minor areas of irregularity as noted above.   DISPOSITION:  Dr. Gerri Spore and I have reviewed the films carefully.  The  patient has an LAD stenosis which in the RAO cranial views is most severe.  In the other views it appears less severe and the residual lumen appears to  be adequate such that rest pain would seem unlikely from the findings  itself.  From an interventional and therapeutic standpoint, the stenosis  itself involves the bifurcation and therefore treatment would likely involve  some compromise of the very large diagonal branch.  We will initiate medical  therapy.  Stress Cardiolite imaging to assess this ischemia will be  recommended.  If this is negative a continued medical approach will be  warranted.  If this is positive or the patient continues to have recurrent  pain, then intervention could be considered.  It would likely require a  provisional cutting balloon approach.  This will be reviewed with the family  in detail.  She will be started on beta blockade.  Additionally,  antiplatelet therapy will also be considered.       TDS/MEDQ  D:  11/28/2004  T:  11/28/2004  Job:  [id number]   cc:   Carole Binning, M.D. [facility]   CV Lab   Rollene Rotunda, M.D.

## 2011-05-04 NOTE — H&P (Signed)
NAME:  BRISEYDA, FEHR NO.:  [id number]   MEDICAL RECORD NO.:  [id number]          PATIENT TYPE:  INP   LOCATION:  3735                         FACILITY:  [facility]   PHYSICIAN:  Rollene Rotunda, M.D.   DATE OF BIRTH:  1951/06/11   DATE OF ADMISSION:  11/27/2004  DATE OF DISCHARGE:                                HISTORY & PHYSICAL   PRIMARY CARE PHYSICIAN:  Ernestina Penna, M.D.   CHIEF COMPLAINT:  Evaluate patient with chest pain.   HISTORY OF PRESENT ILLNESS:  The patient is a pleasant 60 year old white  female with a history of chest discomfort.  She was evaluated in 2003, and  ruled out for a myocardial infarction.  She had stress perfusion study which  demonstrated no evidence of ischemia.  She had done well until Saturday when  she was walking to her car at the [place] while touring.  She developed  chest discomfort.  It was a substernal chest tightness.  It radiated to the  left.  It was a 5-6/10 in intensity.  She was short of breath and had  diaphoresis and dizziness.  There was some discomfort in the left arm.  This  was witnessed by her daughter who is a cardiology nurse at our practice.  She thought the symptoms were quite significant.  The symptoms waxed and  waned over the next 40 hours, until she was finally convinced to come to the  emergency room.  Here she has had no objective evidence of ischemia, with no acute  electrocardiogram changes.  She was treated with morphine, with significant  improvement in her symptoms.  She thinks these symptoms are somewhat  different than her previous chest pain.  There has been no change with  movement.  She did not take any over-the-counter medications at home to  improve this.   PAST MEDICAL HISTORY:  1.  Hypertension.  2.  Hyperlipidemia.  3.  Migraines.  4.  Irritable bowel syndrome.  5.  Gastroesophageal reflux disease with laryngeal, pharyngeal reflux.   PAST SURGICAL HISTORY:  1.  Carpal tunnel  surgery.  2.  Knee surgery.  3.  Ankle surgery.  4.  Abdominal laparoscopic surgery.   ALLERGIES:  PENICILLIN, NORVASC.  (This causes swelling.)   MEDICATIONS:  1.  Lotrel (dose not clear).  2.  Allegra.  3.  Hydrochlorothiazide 12.5 mg daily.  4.  Aspirin 81 mg daily.  5.  Vytorin 10/40 mg.  6.  Nasonex.  7.  Fish oil.  8.  Astelin nasal spray.  9.  Mucinex.  10. Aciphex 20 mg q.a.m. and q.p.m.   SOCIAL HISTORY:  The patient is married.  She is a Midwife.   FAMILY HISTORY:  Positive for her father with a history of coronary artery  disease in his 5's.   REVIEW OF SYSTEMS:  As stated in the HPI, positive for joint pains, recent  upper respiratory infection, otherwise negative for all the systems.   PHYSICAL EXAMINATION:  GENERAL:  The patient is in no distress.  VITAL SIGNS:  Blood pressure 144/87, heart rate 76 and regular, afebrile.  HEENT:  Eyes unremarkable.  Pupils equal, round, reactive to light.  Fundi  not visualized.  Oral mucosa unremarkable.  NECK:  No jugular venous distention.  Wave form within normal limits.  Carotid upstroke brisk and symmetric.  No bruits, no thyromegaly.  NODES:  No cervical, axillary, or inguinal adenopathy.  LUNGS:  Clear to auscultation bilaterally.  BACK:  No costovertebral angle tenderness.  CHEST:  Unremarkable.  HEART:  PMI not displaced or sustained.  S1 and S2 within normal limits.  No  S3, no S4.  No murmurs.  ABDOMEN:  Flat.  Positive bowel sounds, normal in frequency and pitch.  No  bruits, no rebound, no guarding.  No midline pulsatile mass.  No  hepatomegaly, no splenomegaly.  SKIN:  No rashes, no nodules.  EXTREMITIES:  With 2+ pulses throughout.  No edema, no cyanosis, no  clubbing.  NEUROLOGIC:  Oriented to person, place and time.  Cranial nerves II-XII  grossly intact.  Motor grossly intact.  Electrocardiogram:  A sinus rhythm, rate 62, axis within normal limits,  intervals within normal limits, poor  anterior R-wave progression, no acute  ST-T wave changes.   ASSESSMENT/PLAN:  1.  Chest pain:  The patient's chest pain has some typical and some atypical      features.  She had severe symptoms with associated diaphoresis as      witnessed by her daughter.  Because of this and the risk factors, the      patient will be evaluated with a cardiac catheterization.  The risks and      benefits have been described, and she agrees to proceed.  2.  Hypertension:  She will continue the medications as listed, and will      clarify doses.  3.  Reflux:  She will continue medications.  4.  If she has normal coronaries, might be inclined to suggest a GI      evaluation, but will defer to her primary care doctor.       JH/MEDQ  D:  11/27/2004  T:  11/27/2004  Job:  [id number]   cc:   Ernestina Penna, M.D.  [address] [place]  [place] [address]  Fax: [phone]

## 2011-05-21 ENCOUNTER — Encounter: Payer: Self-pay | Admitting: Cardiology

## 2011-09-20 ENCOUNTER — Encounter: Payer: Self-pay | Admitting: Cardiology

## 2011-11-14 ENCOUNTER — Encounter: Payer: Self-pay | Admitting: Cardiology

## 2012-01-21 ENCOUNTER — Telehealth: Payer: Self-pay | Admitting: Internal Medicine

## 2012-01-21 ENCOUNTER — Encounter: Payer: Self-pay | Admitting: Cardiology

## 2012-01-21 NOTE — Telephone Encounter (Signed)
She fractured her coccyx last year. Has had persistent rectal pain and is concerned she could have a rectal problem, one of her family members did have rectal cancer.  She will need an appointment. We also need to see the report of MRI she had - Dr. Marlowe Kays had that done - it is not in EMR so we need to request copy If she can get one and bring it that it would help - I must the report before the visit

## 2012-01-21 NOTE — Telephone Encounter (Signed)
Discussed with the patient.  I have called [facility] and they will send me a copy of the MRI.  She is scheduled for an office visit for 02/01/12 3:45

## 2012-02-01 ENCOUNTER — Encounter: Payer: Self-pay | Admitting: Internal Medicine

## 2012-02-01 ENCOUNTER — Ambulatory Visit (INDEPENDENT_AMBULATORY_CARE_PROVIDER_SITE_OTHER): Payer: BC Managed Care – PPO | Admitting: Internal Medicine

## 2012-02-01 ENCOUNTER — Telehealth: Payer: Self-pay

## 2012-02-01 VITALS — BP 124/80 | HR 78 | Ht 68.0 in | Wt 176.0 lb

## 2012-02-01 DIAGNOSIS — Z860100 Personal history of colon polyps, unspecified: Secondary | ICD-10-CM | POA: Insufficient documentation

## 2012-02-01 DIAGNOSIS — K589 Irritable bowel syndrome without diarrhea: Secondary | ICD-10-CM

## 2012-02-01 DIAGNOSIS — M533 Sacrococcygeal disorders, not elsewhere classified: Secondary | ICD-10-CM

## 2012-02-01 HISTORY — DX: Sacrococcygeal disorders, not elsewhere classified: M53.3

## 2012-02-01 NOTE — Progress Notes (Signed)
Subjective:    Patient ID: Shelly Bennett, female    DOB: 1951-06-28, 61 y.o.   MRN: [id number]  HPI This very pleasant middle-aged white woman presents for evaluation of rectal pain. For about a year or perhaps longer she has had rectal pain. He noticed this after losing a great deal of weight using Weight [employer]. She is lost 61 pounds total, beginning in 2010. She saw an orthopedist, she had an MRI which showed deviation of the coccyx to the right and anteriorly. She recalls being told she had a fracture. However she did not have any trauma. No specific interventions as far as procedures were undertaken. She reports that the doughnut pillow made things worse and she reports increasing pain with sitting on questions as opposed to hard surfaces which do not tend to bother her. She has become concerned that she might have rectal cancer. I had performed a colonoscopy in 2009 because of a family history of colon cancer in a grandparent had rectal cancer. Other than some increased gas she has not had any change in bowel habits or rectal bleeding problems. She does report intermittent left lower quadrant pain at times. She is head CT evaluations and even a barium enema at one point the last 2 years for this. That has been chronic and intermittent and unchanged. She reports rectal exams by her primary care physician and her gynecologist did not reveal any problem.  Allergies  Allergen Reactions  . Penicillins    Outpatient Prescriptions Prior to Visit  Medication Sig Dispense Refill  . amLODipine (NORVASC) 10 MG tablet Take 10 mg by mouth daily.      Marland Kitchen aspirin 81 MG tablet Take 160 mg by mouth daily.      . fexofenadine (ALLEGRA) 180 MG tablet Take 180 mg by mouth daily.      . hydrochlorothiazide (HYDRODIURIL) 25 MG tablet Take 12.5 mg by mouth daily.      . mometasone (NASONEX) 50 MCG/ACT nasal spray as directed.      Marland Kitchen omega-3 acid ethyl esters (LOVAZA) 1 G capsule Take 2 g by mouth 2 (two)  times daily.      . rosuvastatin (CRESTOR) 40 MG tablet Take 20 mg by mouth daily.      . Vitamins A & D 5000-400 UNITS CAPS Take 1 capsule by mouth daily. 5 times a day      . HYDROcodone-acetaminophen (VICODIN) 5-500 MG per tablet Take 1 tablet by mouth every 4 (four) hours as needed.       Past Medical History  Diagnosis Date  . Arthritis   . Chronic headaches   . Adenomatous colon polyp   . CAD (coronary artery disease)   . HLD (hyperlipidemia)   . HTN (hypertension)   . Diverticulosis   . IBS (irritable bowel syndrome)   . Obesity   . External hemorrhoids   . Cystocele    Past Surgical History  Procedure Date  . Knee surgery     right  . Carpal tunnel release   . Colonoscopy 12/15/2008    diverticulosis, external hemorrhoids  . Laproscopy    History   Social History  . Marital Status: Married    Spouse Name: N/A    Number of Children: 4  .     Occupational History  . teacher  retired    Social History Main Topics  . Smoking status: Never Smoker   . Smokeless tobacco: Never Used  . Alcohol Use: No  .  Drug Use: No    Family History  Problem Relation Age of Onset  . Prostate cancer Father   . Colon cancer Father   . Kidney disease Father         Review of Systems Positive for those things mentioned above. She has had some nocturia problems. All other review of systems negative. Except as mentioned in the history of present illness.    Objective:   Physical Exam General:  NAD Eyes:   anicteric Lungs:  clear Heart:  S1S2 no rubs, murmurs or gallops Abdomen:  soft and nontender, BS+ no hepatomegaly or mass, no splenomegaly Rectal: Performed with female staff present. The anoderm looks normal. She is tender over the coccyx externally. It does appear to deviate to the right. Sacrum is not so tender. Digital rectal exam revealed formed somewhat firm stool. There is no mass. I   can feel the rectal mucosa without abnormality. Posteriorly she is tender over  the coccyx and the lower sacral area. Ext:   She has no pain with manipulation of the hips. There is no straight leg raise sign.    Data Reviewed: MRI from June 2012. MRI of the coccyx, showing the deviated coccyx.         Assessment & Plan:

## 2012-02-01 NOTE — Patient Instructions (Signed)
You have been given a copy of your MRI. Go to see your regular orthopedic doctor Dr. Cleophas Dunker about your problems.Marland Kitchen

## 2012-02-01 NOTE — Telephone Encounter (Signed)
Records Received from [facility] gave to [facility] 02/01/12/KM

## 2012-02-01 NOTE — Telephone Encounter (Signed)
ROI faxed to [facility] @ [phone] 02/01/12/Km

## 2012-02-01 NOTE — Assessment & Plan Note (Signed)
I think this is her problem and there is no real rectal relationship. I do not think endoscopic evaluation would help. We discussed her situation. She is bothered by this is his understandable. The physical exam is entirely consistent with a bony process here. It seems possible that her intentional weight loss changed how she sits etc. and might have created some of the problem. I'm not sure why the coccyx is deviated. She will see her regular orthopedist, Dr. Cleophas Dunker to see what he might off her. Perhaps there is some sort of intervention here.  I told her I would be on standby, if we really need to do a sigmoidoscopy it could be done but I thought that if she had a rectal cancer, with this bony pain she was seen changes in the bone on the MRI. She may need repeat cross sectional imaging with an MRI again will defer to orthopedics.

## 2012-02-06 ENCOUNTER — Other Ambulatory Visit: Payer: Self-pay

## 2012-02-06 DIAGNOSIS — R011 Cardiac murmur, unspecified: Secondary | ICD-10-CM

## 2012-02-22 ENCOUNTER — Ambulatory Visit (HOSPITAL_COMMUNITY): Payer: BC Managed Care – PPO | Attending: Cardiovascular Disease

## 2012-02-22 ENCOUNTER — Other Ambulatory Visit: Payer: Self-pay

## 2012-02-22 DIAGNOSIS — I2510 Atherosclerotic heart disease of native coronary artery without angina pectoris: Secondary | ICD-10-CM | POA: Insufficient documentation

## 2012-02-22 DIAGNOSIS — R011 Cardiac murmur, unspecified: Secondary | ICD-10-CM | POA: Insufficient documentation

## 2012-04-30 ENCOUNTER — Encounter: Payer: Self-pay | Admitting: Cardiology

## 2012-08-21 ENCOUNTER — Encounter: Payer: Self-pay | Admitting: Cardiology

## 2013-01-26 ENCOUNTER — Encounter: Payer: Self-pay | Admitting: Cardiology

## 2013-03-11 ENCOUNTER — Ambulatory Visit (INDEPENDENT_AMBULATORY_CARE_PROVIDER_SITE_OTHER): Payer: BC Managed Care – PPO | Admitting: Family Medicine

## 2013-03-11 ENCOUNTER — Encounter: Payer: Self-pay | Admitting: Family Medicine

## 2013-03-11 VITALS — BP 135/74 | HR 71 | Temp 97.1°F | Ht 67.0 in | Wt 185.0 lb

## 2013-03-11 DIAGNOSIS — M25519 Pain in unspecified shoulder: Secondary | ICD-10-CM

## 2013-03-11 DIAGNOSIS — E559 Vitamin D deficiency, unspecified: Secondary | ICD-10-CM

## 2013-03-11 DIAGNOSIS — M899 Disorder of bone, unspecified: Secondary | ICD-10-CM

## 2013-03-11 DIAGNOSIS — J309 Allergic rhinitis, unspecified: Secondary | ICD-10-CM

## 2013-03-11 DIAGNOSIS — M25512 Pain in left shoulder: Secondary | ICD-10-CM

## 2013-03-11 DIAGNOSIS — E785 Hyperlipidemia, unspecified: Secondary | ICD-10-CM

## 2013-03-11 DIAGNOSIS — I10 Essential (primary) hypertension: Secondary | ICD-10-CM

## 2013-03-11 DIAGNOSIS — M25511 Pain in right shoulder: Secondary | ICD-10-CM | POA: Insufficient documentation

## 2013-03-11 DIAGNOSIS — M8580 Other specified disorders of bone density and structure, unspecified site: Secondary | ICD-10-CM

## 2013-03-11 MED ORDER — AZELASTINE HCL 0.1 % NA SOLN: NASAL | Status: DC

## 2013-03-11 NOTE — Progress Notes (Signed)
  Subjective:    Patient ID: Shelly Bennett, female    DOB: 1951-02-10, 62 y.o.   MRN: [id number]  HPI This patient presents for recheck of multiple medical problems.   Patient Active Problem List  Diagnosis  . IBS (irritable bowel syndrome) - with chronic recurrent abdominal pain  . Personal history of colonic polyps - adenoma  . Coccydynia    In addition, see review of systems.  The allergies, current medications, past medical history, surgical history, family and social history are reviewed.  Immunizations reviewed.  Health maintenance reviewed and updated.      Review of Systems  HENT: Positive for congestion, postnasal drip and sinus pressure.   Respiratory: Positive for cough (occ prod).   Cardiovascular: Negative.   Gastrointestinal: Negative.   Genitourinary: Negative.   Musculoskeletal: Positive for arthralgias (L shoulder).  Neurological: Negative.   Psychiatric/Behavioral: Positive for sleep disturbance (occasional, wakes during the night).       Objective:   Physical Exam BP 135/74  Pulse 71  Temp(Src) 97.1 F (36.2 C) (Oral)  Ht 5\' 7"  (1.702 m)  Wt 185 lb (83.915 kg)  BMI 28.97 kg/m2  The patient appeared well nourished and normally developed, alert and oriented to time and place. Speech, behavior and judgement appear normal. Vital signs as documented.  Head exam is unremarkable. No scleral icterus or pallor noted.  Neck is without jugular venous distension, thyromegally, or carotid bruits. Carotid upstrokes are brisk bilaterally. No cervical adenopathy. Lungs are clear anteriorly and posteriorly to auscultation. Normal respiratory effort. Cardiac exam reveals regular rate and rhythm. First and second heart sounds normal. No  rubs or gallops. Grade 2/6 SEM(history of MR) Abdominal exam reveals normal bowl sounds, no masses, no organomegaly and no aortic enlargement. No inguinal adenopathy. Extremities are nonedematous and both femoral and pedal  pulses are normal.Limited movement of left shoulder with abduction Skin without pallor or jaundice.  Warm and dry, without rash. Neurologic exam reveals normal deep tendon reflexes and normal sensation.          Assessment & Plan:  Hyperlipemia Continue Crestor and be more adherent to weight watchers diet  Essential hypertension, benign Kidney current medicines watch weight exercise regularly Blood pressure under good control today  Allergic rhinitis Continue Allegra Continue Nasacort Will add Astelin nose spray one spray each nostril at bedtime  Left shoulder pain With limited pain and movement and left shoulder she should return to see Dr. Cleophas Dunker

## 2013-03-11 NOTE — Assessment & Plan Note (Signed)
Continue Allegra Continue Nasacort Will add Astelin nose spray one spray each nostril at bedtime

## 2013-03-11 NOTE — Assessment & Plan Note (Signed)
With limited pain and movement and left shoulder she should return to see Dr. Cleophas Dunker

## 2013-03-11 NOTE — Patient Instructions (Addendum)
Continue current meds and therapeutic lifestyle changes 1. Take meds as prescribed 2. Use a cool mist humidifier especially during the winter months and when the heat has  been on. 3. Use saline nose sprays frequently (or) 4. Saline irrigations of the nose can be very helpful if done frequently.  * 4X daily for 1 week*  * Use of a nettie pot can be helpful with this. Follow directions with this equipment* 5. Drink plenty of fluids 6. Keep thermostat at lower temperatures 7.For any cough or congestion  For Adults use plain Mucinex- regular strength or max strength   *For Children use Children's mucinex and or consult with Pharmacist for dosing 8. For fever or aches or pains- take tylenol or ibuprofen appropriate for age and weight.  * for fevers greater than 101 orally you may alternate ibuprofen and tylenol every  3 hours.  Take husband for a walk cont Weight Watchers diet as doing Fasting labs in 3 months DEXA scan in July

## 2013-03-11 NOTE — Assessment & Plan Note (Signed)
Continue Crestor and be more adherent to weight watchers diet

## 2013-03-11 NOTE — Assessment & Plan Note (Signed)
Kidney current medicines watch weight exercise regularly Blood pressure under good control today

## 2013-03-18 ENCOUNTER — Encounter: Payer: Self-pay | Admitting: Cardiology

## 2013-03-18 ENCOUNTER — Ambulatory Visit (INDEPENDENT_AMBULATORY_CARE_PROVIDER_SITE_OTHER): Payer: BC Managed Care – PPO | Admitting: Cardiology

## 2013-03-18 VITALS — BP 122/76 | HR 64 | Ht 68.0 in | Wt 184.0 lb

## 2013-03-18 DIAGNOSIS — I10 Essential (primary) hypertension: Secondary | ICD-10-CM

## 2013-03-18 DIAGNOSIS — I350 Nonrheumatic aortic (valve) stenosis: Secondary | ICD-10-CM

## 2013-03-18 DIAGNOSIS — I2510 Atherosclerotic heart disease of native coronary artery without angina pectoris: Secondary | ICD-10-CM

## 2013-03-18 DIAGNOSIS — E785 Hyperlipidemia, unspecified: Secondary | ICD-10-CM

## 2013-03-18 DIAGNOSIS — I359 Nonrheumatic aortic valve disorder, unspecified: Secondary | ICD-10-CM

## 2013-03-18 NOTE — Progress Notes (Signed)
HPI:  This very nice patient returns today with her husband. She is return to Weight Watchers, and is working on this diligently, and her lipids are under the care of Dr. Christell Constant in [place]. She denies any chest pain or shortness of breath. We reviewed the results of her echocardiogram in detail going over some of the specifics of aortic valve disease. She's had one episode of syncope in the past, but this occurred when she was very hot and probably had a viral syndrome as well. Otherwise there've been no specific symptoms. She is anxious to get walking again after the winter months.  Current Outpatient Prescriptions  Medication Sig Dispense Refill  . amLODipine (NORVASC) 10 MG tablet Take 10 mg by mouth daily.      Marland Kitchen aspirin 81 MG tablet Take 160 mg by mouth daily.      Marland Kitchen azelastine (ASTELIN) 137 MCG/SPRAY nasal spray Use in each nostril as directed 1-2 spray at bedtime  30 mL  12  . calcium carbonate 200 MG capsule Take 250 mg by mouth 2 (two) times daily with a meal.      . Cholecalciferol (VITAMIN D-3) 5000 UNITS TABS Take 1 capsule by mouth daily.      . fexofenadine (ALLEGRA) 180 MG tablet Take 180 mg by mouth daily.      Marland Kitchen guaiFENesin (MUCINEX) 600 MG 12 hr tablet Take 1,200 mg by mouth. As needed      . hydrochlorothiazide (HYDRODIURIL) 25 MG tablet Take 12.5 mg by mouth daily.      . mometasone (NASONEX) 50 MCG/ACT nasal spray as directed.      Marland Kitchen omega-3 acid ethyl esters (LOVAZA) 1 G capsule Take 2 g by mouth 2 (two) times daily.      Marland Kitchen omeprazole (PRILOSEC) 20 MG capsule Take 20 mg by mouth daily. One by mouth Mon Wed Friday      . rosuvastatin (CRESTOR) 40 MG tablet Take 20 mg by mouth daily.       No current facility-administered medications for this visit.    Allergies  Allergen Reactions  . Penicillins   . Ace Inhibitors Cough  . Angiotensin Receptor Blockers Cough  . Vytorin (Ezetimibe-Simvastatin) Other (See Comments)    cramping  . Zetia (Ezetimibe) Other (See Comments)     cramping    Past Medical History  Diagnosis Date  . Arthritis   . Chronic headaches   . Adenomatous colon polyp   . CAD (coronary artery disease)   . HLD (hyperlipidemia)   . HTN (hypertension)   . Diverticulosis   . IBS (irritable bowel syndrome)   . Obesity   . External hemorrhoids   . Cystocele     Past Surgical History  Procedure Laterality Date  . Knee surgery      right  . Carpal tunnel release    . Colonoscopy  12/15/2008    diverticulosis, external hemorrhoids  . Laproscopy      Family History  Problem Relation Age of Onset  . Prostate cancer Father   . Colon cancer Father   . Kidney disease Father   . Hyperlipidemia Mother   . Hypertension Mother   . Kidney disease Mother   . Osteoporosis Mother   . Hyperlipidemia Sister   . Hypertension Sister   . Heart disease Brother     History   Social History  . Marital Status: Married    Spouse Name: Maisie Fus    Number of Children: 4  . Years  of Education: N/A   Occupational History  . teacher    Social History Main Topics  . Smoking status: Never Smoker   . Smokeless tobacco: Never Used  . Alcohol Use: No  . Drug Use: No  . Sexually Active: Not on file   Other Topics Concern  . Not on file   Social History Narrative  . No narrative on file    ROS: Please see the HPI.  All other systems reviewed and negative.  PHYSICAL EXAM:  BP 122/76  Pulse 64  Ht 5\' 8"  (1.727 m)  Wt 184 lb (83.462 kg)  BMI 27.98 kg/m2  SpO2 98%  General: Well developed, well nourished, in no acute distress. Head:  Normocephalic and atraumatic. Neck: no JVD Lungs: Clear to auscultation and percussion. Heart: Normal S1 and S2.  Mid peaking SEM at ULSE, radiating to the base of the carotids without carotid transmission.   Pulses: Pulses normal in all 4 extremities. Extremities: No clubbing or cyanosis. No edema. Neurologic: Alert and oriented x 3.  EKG:  Reviewed with patient.  No acute findings.  See report in  EPIC  ASSESSMENT AND PLAN:  Will arrange fu with Dr. Excell Seltzer

## 2013-03-18 NOTE — Patient Instructions (Signed)
Your physician wants you to follow-up in: 1 YEAR with Dr Cooper.  You will receive a reminder letter in the mail two months in advance. If you don't receive a letter, please call our office to schedule the follow-up appointment.  Your physician recommends that you continue on your current medications as directed. Please refer to the Current Medication list given to you today.  

## 2013-03-22 DIAGNOSIS — I2510 Atherosclerotic heart disease of native coronary artery without angina pectoris: Secondary | ICD-10-CM | POA: Insufficient documentation

## 2013-03-22 DIAGNOSIS — I350 Nonrheumatic aortic (valve) stenosis: Secondary | ICD-10-CM | POA: Insufficient documentation

## 2013-03-22 NOTE — Assessment & Plan Note (Signed)
She continues to remain stable.  Continued exercise is suggested to help maintain weight, and current management.

## 2013-03-22 NOTE — Assessment & Plan Note (Signed)
Findings and assessment reviewed.  This will require long term follow up.  Impications reviewed with the patient.

## 2013-03-22 NOTE — Assessment & Plan Note (Signed)
Remains nicely controlled on two drug treatment. Electrolytes are followed by Dr. Christell Constant

## 2013-03-22 NOTE — Assessment & Plan Note (Signed)
On aggressive medical therapy, and closely supervised by her primary MD.

## 2013-05-14 ENCOUNTER — Other Ambulatory Visit: Payer: Self-pay | Admitting: Family Medicine

## 2013-07-13 ENCOUNTER — Encounter: Payer: Self-pay | Admitting: Family Medicine

## 2013-07-13 ENCOUNTER — Ambulatory Visit (INDEPENDENT_AMBULATORY_CARE_PROVIDER_SITE_OTHER): Payer: BC Managed Care – PPO | Admitting: Family Medicine

## 2013-07-13 VITALS — BP 137/70 | HR 58 | Temp 97.5°F | Ht 67.0 in | Wt 183.0 lb

## 2013-07-13 DIAGNOSIS — M79609 Pain in unspecified limb: Secondary | ICD-10-CM

## 2013-07-13 DIAGNOSIS — I10 Essential (primary) hypertension: Secondary | ICD-10-CM

## 2013-07-13 DIAGNOSIS — M949 Disorder of cartilage, unspecified: Secondary | ICD-10-CM

## 2013-07-13 DIAGNOSIS — E785 Hyperlipidemia, unspecified: Secondary | ICD-10-CM

## 2013-07-13 DIAGNOSIS — R7989 Other specified abnormal findings of blood chemistry: Secondary | ICD-10-CM

## 2013-07-13 DIAGNOSIS — I2510 Atherosclerotic heart disease of native coronary artery without angina pectoris: Secondary | ICD-10-CM

## 2013-07-13 DIAGNOSIS — E559 Vitamin D deficiency, unspecified: Secondary | ICD-10-CM

## 2013-07-13 DIAGNOSIS — M79671 Pain in right foot: Secondary | ICD-10-CM

## 2013-07-13 DIAGNOSIS — K219 Gastro-esophageal reflux disease without esophagitis: Secondary | ICD-10-CM

## 2013-07-13 DIAGNOSIS — M8580 Other specified disorders of bone density and structure, unspecified site: Secondary | ICD-10-CM

## 2013-07-13 DIAGNOSIS — J309 Allergic rhinitis, unspecified: Secondary | ICD-10-CM

## 2013-07-13 LAB — POCT CBC
HCT, POC: 52 % — AB (ref 37.7–47.9)
Hemoglobin: 17.3 g/dL — AB (ref 12.2–16.2)
Lymph, poc: 1.9 (ref 0.6–3.4)
MCH, POC: 30.3 pg (ref 27–31.2)
MCHC: 33.3 g/dL (ref 31.8–35.4)
MCV: 91 fL (ref 80–97)
MPV: 8.3 fL (ref 0–99.8)
RBC: 5.7 M/uL — AB (ref 4.04–5.48)
WBC: 7.1 10*3/uL (ref 4.6–10.2)

## 2013-07-13 NOTE — Addendum Note (Signed)
Addended by: Prescott Gum on: 07/13/2013 02:30 PM   Modules accepted: Orders

## 2013-07-13 NOTE — Patient Instructions (Addendum)
Fall precautions discussed Continue current meds and therapeutic lifestyle changes Schedule Dexascan Continue regular exercise Continue plenty of liquid intake this summer

## 2013-07-13 NOTE — Progress Notes (Signed)
  Subjective:    Patient ID: Shelly Bennett, female    DOB: 11-17-1951, 62 y.o.   MRN: [id number]  HPI Patient comes in today for followup and management of chronic medical problems. These include hypertension, hyperlipidemia, allergic rhinitis, vitamin D deficiency and occasional problems with GERD. Also see the review of systems. Health maintenance parameters are up-to-date. Patient  recently had a skin evaluation by a dermatologist. Patient sees a cardiologist yearly in the spring.   Review of Systems  Constitutional: Positive for fatigue (slight).  HENT: Negative.  Negative for congestion.   Eyes: Negative.        Recent eye exam with [facility] 06/29/2013 Dr Melynda Ripple  Respiratory: Negative for cough, choking, chest tightness, shortness of breath and wheezing.   Cardiovascular: Negative for chest pain, palpitations and leg swelling.  Gastrointestinal: Negative for nausea, vomiting, abdominal pain, diarrhea, constipation, blood in stool, abdominal distention, anal bleeding and rectal pain.  Endocrine: Negative.   Genitourinary: Negative for dysuria, frequency, vaginal bleeding, vaginal discharge and vaginal pain.  Musculoskeletal: Positive for back pain (LBP, spasms), joint swelling (R foot) and arthralgias (R foot).  Skin: Negative.   Allergic/Immunologic: Negative.   Neurological: Negative.   Hematological: Negative.   Psychiatric/Behavioral: Positive for sleep disturbance (wakes frequently).       Objective:   Physical Exam BP 137/70  Pulse 58  Temp(Src) 97.5 F (36.4 C) (Oral)  Ht 5\' 7"  (1.702 m)  Wt 183 lb (83.008 kg)  BMI 28.66 kg/m2  The patient appeared well nourished and normally developed, alert and oriented to time and place. Speech, behavior and judgement appear normal. Vital signs as documented.  Head exam is unremarkable. No scleral icterus or pallor noted. Minimal nasal congestion bilaterally. Mouth and throat were normal. Ear canals were normal.  Neck is without  jugular venous distension, thyromegally, or carotid bruits. Carotid upstrokes are brisk bilaterally. No cervical adenopathy. Lungs are clear anteriorly and posteriorly to auscultation. Normal respiratory effort. Cardiac exam reveals regular rate and rhythm at 72 per minute. First and second heart sounds normal.  No murmurs, rubs or gallops.  Abdominal exam reveals normal bowl sounds, no masses, no organomegaly and no aortic enlargement. No inguinal adenopathy. There was slight generalized abdominal tenderness. Extremities are nonedematous and both femoral and pedal pulses are normal. The right foot did not appear any more swollen than the left foot today. There is no specific tenderness today. There were more varicosities on the right foot. Skin without pallor or jaundice.  Warm and dry, without rash. Neurologic exam reveals normal deep tendon reflexes and normal sensation.          Assessment & Plan:  1. Hypertension - POCT CBC; Standing - BMP8+EGFR; Standing  2. Hyperlipemia - Hepatic function panel; Standing - NMR, lipoprofile; Standing  3. Osteopenia  4. Vitamin D deficiency - Vitamin D 25 hydroxy; Standing  5. GERD (gastroesophageal reflux disease)  6. Hypertension  7. Allergic rhinitis  8. CAD (coronary artery disease), native coronary artery  9. Right foot pain - Uric acid  Patient Instructions  Fall precautions discussed Continue current meds and therapeutic lifestyle changes Schedule Dexascan Continue regular exercise Continue plenty of liquid intake this summer   Nyra Capes MD

## 2013-07-15 LAB — BMP8+EGFR
BUN/Creatinine Ratio: 21 (ref 11–26)
Calcium: 10 mg/dL (ref 8.6–10.2)
Creatinine, Ser: 0.68 mg/dL (ref 0.57–1.00)
GFR calc Af Amer: 108 mL/min/{1.73_m2} (ref >59)
GFR calc non Af Amer: 94 mL/min/{1.73_m2} (ref >59)
Sodium: 141 mmol/L (ref 134–144)

## 2013-07-15 LAB — URIC ACID: Uric Acid: 5.4 mg/dL (ref 2.5–7.1)

## 2013-07-15 LAB — HEPATIC FUNCTION PANEL
AST: 20 IU/L (ref 0–40)
Albumin: 4.3 g/dL (ref 3.6–4.8)
Bilirubin, Direct: 0.17 mg/dL (ref 0.00–0.40)
Total Bilirubin: 0.7 mg/dL (ref 0.0–1.2)

## 2013-07-15 LAB — NMR, LIPOPROFILE
HDL Cholesterol by NMR: 58 mg/dL (ref >=40)
HDL Particle Number: 38.7 umol/L (ref >=30.5)
LDL Size: 21.1 nm (ref >20.5)
Small LDL Particle Number: 473 nmol/L (ref <=527)

## 2013-07-22 NOTE — Addendum Note (Signed)
Addended by: Orma Render F on: 07/22/2013 05:52 PM   Modules accepted: Orders

## 2013-08-12 ENCOUNTER — Ambulatory Visit (INDEPENDENT_AMBULATORY_CARE_PROVIDER_SITE_OTHER): Payer: BC Managed Care – PPO | Admitting: Pharmacist

## 2013-08-12 ENCOUNTER — Ambulatory Visit (INDEPENDENT_AMBULATORY_CARE_PROVIDER_SITE_OTHER): Payer: BC Managed Care – PPO

## 2013-08-12 ENCOUNTER — Encounter: Payer: Self-pay | Admitting: Pharmacist

## 2013-08-12 ENCOUNTER — Other Ambulatory Visit (INDEPENDENT_AMBULATORY_CARE_PROVIDER_SITE_OTHER): Payer: BC Managed Care – PPO

## 2013-08-12 VITALS — Ht 67.0 in | Wt 184.0 lb

## 2013-08-12 DIAGNOSIS — E785 Hyperlipidemia, unspecified: Secondary | ICD-10-CM

## 2013-08-12 DIAGNOSIS — M810 Age-related osteoporosis without current pathological fracture: Secondary | ICD-10-CM | POA: Insufficient documentation

## 2013-08-12 DIAGNOSIS — M8580 Other specified disorders of bone density and structure, unspecified site: Secondary | ICD-10-CM

## 2013-08-12 DIAGNOSIS — M899 Disorder of bone, unspecified: Secondary | ICD-10-CM

## 2013-08-12 DIAGNOSIS — I10 Essential (primary) hypertension: Secondary | ICD-10-CM

## 2013-08-12 DIAGNOSIS — R7989 Other specified abnormal findings of blood chemistry: Secondary | ICD-10-CM

## 2013-08-12 DIAGNOSIS — E559 Vitamin D deficiency, unspecified: Secondary | ICD-10-CM

## 2013-08-12 LAB — POCT CBC
Granulocyte percent: 71.2 %G (ref 37–80)
HCT, POC: 37.9 % (ref 37.7–47.9)
Hemoglobin: 13 g/dL (ref 12.2–16.2)
MCV: 90.5 fL (ref 80–97)
POC Granulocyte: 4.3 (ref 2–6.9)
RBC: 4.2 M/uL (ref 4.04–5.48)

## 2013-08-12 NOTE — Progress Notes (Signed)
Patient came in for labs only.

## 2013-08-12 NOTE — Patient Instructions (Signed)

## 2013-08-12 NOTE — Progress Notes (Signed)
Patient ID: Shelly Bennett, female   DOB: May 08, 1951, 62 y.o.   MRN: [id number] [facility] Current Height: Height: 5\' 7"  (170.2 cm)      Max Lifetime Height:  5' 8.5" Current Weight: Weight: 184 lb (83.462 kg)       Ethnicity:Caucasian    HPI: Does pt already have a diagnosis of:  Osteopenia?  Yes Osteoporosis?  No  Back Pain?  Yes       Kyphosis?  No Prior fracture?  Yes - elbow (2008) and ankle (around 1999) Med(s) for Osteoporosis/Osteopenia:  Calcium and vitamin D Med(s) previously tried for Osteoporosis/Osteopenia:  none                                                             PMH: Age at menopause:  About 62 yo Hysterectomy?  No Oophorectomy?  No HRT? No Steroid Use?  No Thyroid med?  No History of cancer?  No History of digestive disorders (ie Crohn's)?  Yes - IBS Current or previous eating disorders?  No Last Vitamin D Result:  56.3 (07/13/2013) Last GFR Result:  94 (07/13/2013)   FH/SH: Family history of osteoporosis?  Yes - mother  Parent with history of hip fracture?  No Family history of breast cancer?  Yes - maternal aunt  Exercise?  No Smoking?  No Alcohol?  No    Calcium Assessment Calcium Intake  # of servings/day  Calcium mg  Milk (8 oz) qd  x  300  = 300mg   Yogurt (4 oz) Qod to qd x  200 = 100mg   Cheese (1 oz) 0 x  200 = 0  Other Calcium sources   250mg   Ca supplement Calcium 250mg   2 tabs bid = 1000mg    Estimated calcium intake per day 1650mg     DEXA Results Date of Test T-Score for AP Spine L1-L4 T-Score for Total Left Hip T-Score for Total Right Hip  08/12/2013 -1.6 -0.6 -0.1  05/30/2011 -1.0 -0.4 -0.1             FRAX 10 year estimate: Total FX risk:  15% (consider medication if >/= 20%) Hip FX risk:  1.5%  (consider medication if >/= 3%)  Assessment: Osteopenia with decrease BMD  Recommendations: 1.   reloxifine (EVISTA) recommended but patient wanted to discuss with her gynecologist before she starts - DEXA  results given to patient to take to appt in October 2014  2.  continue calcium 1200mg  daily through supplementation or diet.  3.  recommend weight bearing exercise - 30 minutes at least 4 days  per week.   4.  Counseled and educated about fall risk and prevention.  Recheck DEXA:  2 years  Time spent counseling patient:  20 minutes       \

## 2013-08-13 LAB — BMP8+EGFR
BUN/Creatinine Ratio: 18 (ref 11–26)
BUN: 13 mg/dL (ref 8–27)
CO2: 24 mmol/L (ref 18–29)
Calcium: 9.5 mg/dL (ref 8.6–10.2)
Creatinine, Ser: 0.71 mg/dL (ref 0.57–1.00)
GFR calc non Af Amer: 91 mL/min/{1.73_m2} (ref >59)
Sodium: 143 mmol/L (ref 134–144)

## 2013-08-13 LAB — NMR, LIPOPROFILE
Cholesterol: 160 mg/dL (ref <200)
HDL Cholesterol by NMR: 59 mg/dL (ref >=40)
HDL Particle Number: 38.3 umol/L (ref >=30.5)
LDLC SERPL CALC-MCNC: 84 mg/dL (ref <100)
Small LDL Particle Number: 300 nmol/L (ref <=527)

## 2013-08-13 LAB — HEPATIC FUNCTION PANEL
AST: 22 IU/L (ref 0–40)
Albumin: 4.3 g/dL (ref 3.6–4.8)
Total Bilirubin: 0.6 mg/dL (ref 0.0–1.2)
Total Protein: 6.1 g/dL (ref 6.0–8.5)

## 2013-09-15 ENCOUNTER — Ambulatory Visit (INDEPENDENT_AMBULATORY_CARE_PROVIDER_SITE_OTHER): Payer: BC Managed Care – PPO

## 2013-09-15 DIAGNOSIS — Z23 Encounter for immunization: Secondary | ICD-10-CM

## 2013-10-14 DIAGNOSIS — H1589 Other disorders of sclera: Secondary | ICD-10-CM | POA: Insufficient documentation

## 2013-10-14 DIAGNOSIS — H02889 Meibomian gland dysfunction of unspecified eye, unspecified eyelid: Secondary | ICD-10-CM | POA: Insufficient documentation

## 2013-10-30 ENCOUNTER — Other Ambulatory Visit: Payer: Self-pay | Admitting: Family Medicine

## 2013-11-11 ENCOUNTER — Ambulatory Visit (INDEPENDENT_AMBULATORY_CARE_PROVIDER_SITE_OTHER): Payer: BC Managed Care – PPO | Admitting: Family Medicine

## 2013-11-11 ENCOUNTER — Encounter: Payer: Self-pay | Admitting: Family Medicine

## 2013-11-11 ENCOUNTER — Encounter (INDEPENDENT_AMBULATORY_CARE_PROVIDER_SITE_OTHER): Payer: Self-pay

## 2013-11-11 VITALS — BP 126/78 | HR 70 | Temp 97.3°F | Ht 67.0 in | Wt 183.0 lb

## 2013-11-11 DIAGNOSIS — E559 Vitamin D deficiency, unspecified: Secondary | ICD-10-CM

## 2013-11-11 DIAGNOSIS — J309 Allergic rhinitis, unspecified: Secondary | ICD-10-CM

## 2013-11-11 DIAGNOSIS — L820 Inflamed seborrheic keratosis: Secondary | ICD-10-CM

## 2013-11-11 DIAGNOSIS — E785 Hyperlipidemia, unspecified: Secondary | ICD-10-CM

## 2013-11-11 DIAGNOSIS — Z23 Encounter for immunization: Secondary | ICD-10-CM

## 2013-11-11 DIAGNOSIS — I10 Essential (primary) hypertension: Secondary | ICD-10-CM

## 2013-11-11 DIAGNOSIS — I2510 Atherosclerotic heart disease of native coronary artery without angina pectoris: Secondary | ICD-10-CM

## 2013-11-11 LAB — POCT CBC
Granulocyte percent: 69.8 %G (ref 37–80)
Lymph, poc: 1.6 (ref 0.6–3.4)
MCV: 90.4 fL (ref 80–97)
MPV: 7.9 fL (ref 0–99.8)
POC Granulocyte: 3.9 (ref 2–6.9)
POC LYMPH PERCENT: 28.4 %L (ref 10–50)
Platelet Count, POC: 226 10*3/uL (ref 142–424)
RBC: 4.4 M/uL (ref 4.04–5.48)
RDW, POC: 13.1 %
WBC: 5.6 10*3/uL (ref 4.6–10.2)

## 2013-11-11 NOTE — Progress Notes (Addendum)
Subjective:    Patient ID: Shelly Bennett, female    DOB: 02-10-1951, 62 y.o.   MRN: [id number]  HPI Pt here for follow up and management of chronic medical problems.       Patient Active Problem List   Diagnosis Date Noted  . Osteopenia 08/12/2013  . CAD (coronary artery disease), native coronary artery 03/22/2013  . Aortic valve stenosis, mild 03/22/2013  . Hyperlipemia 03/11/2013  . Hypertension 03/11/2013  . Allergic rhinitis 03/11/2013  . Left shoulder pain 03/11/2013  . IBS (irritable bowel syndrome) - with chronic recurrent abdominal pain 02/01/2012  . Personal history of colonic polyps - adenoma 02/01/2012  . Coccydynia 02/01/2012   Outpatient Encounter Prescriptions as of 11/11/2013  Medication Sig  . amLODipine (NORVASC) 10 MG tablet Take 10 mg by mouth daily.  Marland Kitchen aspirin 81 MG tablet Take 160 mg by mouth daily.  Marland Kitchen azelastine (ASTELIN) 137 MCG/SPRAY nasal spray Use in each nostril as directed 1-2 spray at bedtime  . calcium carbonate 200 MG capsule Take 250 mg by mouth 2 (two) times daily with a meal.  . Cholecalciferol (VITAMIN D-3) 5000 UNITS TABS Take 1 capsule by mouth daily. Mon thru Friday  . desonide (DESOWEN) 0.05 % cream   . fexofenadine (ALLEGRA) 180 MG tablet Take 180 mg by mouth daily.  Marland Kitchen guaiFENesin (MUCINEX) 600 MG 12 hr tablet Take 1,200 mg by mouth. As needed  . hydrochlorothiazide (HYDRODIURIL) 25 MG tablet Take 12.5 mg by mouth daily.  Marland Kitchen LOTEMAX 0.5 % ophthalmic suspension   . LOVAZA 1 G capsule TAKE ONE CAPSULE BY MOUTH FOUR TIMES DAILY  . mometasone (NASONEX) 50 MCG/ACT nasal spray as directed.  Marland Kitchen omeprazole (PRILOSEC) 20 MG capsule Take 20 mg by mouth daily. One by mouth Mon Wed Friday  . rosuvastatin (CRESTOR) 40 MG tablet     Review of Systems  Constitutional: Negative.   HENT: Negative.        Nasal sores  Eyes: Negative.   Respiratory: Negative.   Cardiovascular: Negative.   Gastrointestinal: Negative.   Endocrine: Negative.     Genitourinary: Negative.   Musculoskeletal: Negative.   Skin: Negative.        Check red place on chest  Allergic/Immunologic: Negative.   Neurological: Negative.   Hematological: Negative.   Psychiatric/Behavioral: The patient is nervous/anxious (nervous about flying).        Objective:   Physical Exam  Nursing note and vitals reviewed. Constitutional: She is oriented to person, place, and time. She appears well-developed and well-nourished. No distress.  HENT:  Head: Normocephalic and atraumatic.  Right Ear: External ear normal.  Left Ear: External ear normal.  Mouth/Throat: No oropharyngeal exudate.  Patient has some saline lysis of the nares with skin that is cracked open near the nasal septum, there is also nasal congestion bilaterally  Eyes: Conjunctivae and EOM are normal. Pupils are equal, round, and reactive to light. Right eye exhibits no discharge. Left eye exhibits no discharge. No scleral icterus.  Neck: Normal range of motion. Neck supple. No thyromegaly present.  No carotid bruits  Cardiovascular: Normal rate, regular rhythm, normal heart sounds and intact distal pulses.  Exam reveals no gallop and no friction rub.   No murmur heard. At 72 per minute  Pulmonary/Chest: Effort normal and breath sounds normal. No respiratory distress. She has no wheezes. She has no rales.  Abdominal: Soft. Bowel sounds are normal. She exhibits distension. She exhibits no mass. There is no tenderness. There is  no rebound and no guarding.  Musculoskeletal: Normal range of motion. She exhibits no edema and no tenderness.  Lymphadenopathy:    She has no cervical adenopathy.  Neurological: She is alert and oriented to person, place, and time. She has normal reflexes. No cranial nerve deficit.  Skin: Skin is warm and dry. There is erythema.  Irritated seborrheic keratosis left upper chest wall  Psychiatric: She has a normal mood and affect. Her behavior is normal. Judgment and thought  content normal.   BP 126/78  Pulse 70  Temp(Src) 97.3 F (36.3 C) (Oral)  Ht 5\' 7"  (1.702 m)  Wt 183 lb (83.008 kg)  BMI 28.66 kg/m2  Cryotherapy of single chest wall lesion was done without problems.      Assessment & Plan:rd   1. Hypertension   2. CAD (coronary artery disease), native coronary artery   3. Hyperlipemia   4. Vitamin D deficiency   5. Allergic rhinitis   6. Unspecified vitamin D deficiency   7. Need for prophylactic vaccination and inoculation against influenza   8. irritated seborrheic keratosis chest wall Orders Placed This Encounter  Procedures  . Hepatic function panel  . BMP8+EGFR  . NMR, lipoprofile  . Vit D  25 hydroxy (rtn osteoporosis monitoring)  . POCT CBC   Meds ordered this encounter  Medications  . LOTEMAX 0.5 % ophthalmic suspension    Sig:    Patient Instructions  Continue current medications. Continue good therapeutic lifestyle changes which include good diet and exercise. Fall precautions discussed with patient. Schedule your flu vaccine if you haven't had it yet If you are over 60 years old - you may need Prevnar 13 or the adult Pneumonia vaccine. Bactroban ointment, over-the-counter, apply sparingly to nostril at nighttime before retiring If problems continue with nasal irritation consider valacyclovir, 1 g 2 twice daily Do not forget to followup for your colonoscopy in December, please call the gastroenterologist office and confirm this Check with your insurance regarding Prevnar vaccine Do not forget to use a cool mist humidifier in her bedroom at nighttime and saline nose spray throughout the day to hydrate her nasal passages We will call you with the lab work once the results are available   Nyra Capes MD

## 2013-11-11 NOTE — Patient Instructions (Addendum)
Continue current medications. Continue good therapeutic lifestyle changes which include good diet and exercise. Fall precautions discussed with patient. Schedule your flu vaccine if you haven't had it yet If you are over 63 years old - you may need Prevnar 13 or the adult Pneumonia vaccine. Bactroban ointment, over-the-counter, apply sparingly to nostril at nighttime before retiring If problems continue with nasal irritation consider valacyclovir, 1 g 2 twice daily Do not forget to followup for your colonoscopy in December, please call the gastroenterologist office and confirm this Check with your insurance regarding Prevnar vaccine Do not forget to use a cool mist humidifier in her bedroom at nighttime and saline nose spray throughout the day to hydrate her nasal passages We will call you with the lab work once the results are available

## 2013-11-14 LAB — BMP8+EGFR
BUN/Creatinine Ratio: 22 (ref 11–26)
BUN: 14 mg/dL (ref 8–27)
CO2: 27 mmol/L (ref 18–29)
Calcium: 9.8 mg/dL (ref 8.6–10.2)
Creatinine, Ser: 0.63 mg/dL (ref 0.57–1.00)
GFR calc non Af Amer: 96 mL/min/{1.73_m2} (ref >59)

## 2013-11-14 LAB — SPECIMEN STATUS REPORT

## 2013-11-14 LAB — VITAMIN D 25 HYDROXY (VIT D DEFICIENCY, FRACTURES): Vit D, 25-Hydroxy: 69.5 ng/mL (ref 30.0–100.0)

## 2013-11-14 LAB — NMR, LIPOPROFILE
HDL Cholesterol by NMR: 58 mg/dL (ref >=40)
LDLC SERPL CALC-MCNC: 71 mg/dL (ref <100)
LP-IR Score: <25 (ref <=45)
Small LDL Particle Number: 451 nmol/L (ref <=527)
Triglycerides by NMR: 73 mg/dL (ref <150)

## 2013-11-14 LAB — HEPATIC FUNCTION PANEL
AST: 22 IU/L (ref 0–40)
Albumin: 4.3 g/dL (ref 3.6–4.8)

## 2013-11-18 ENCOUNTER — Encounter: Payer: Self-pay | Admitting: Internal Medicine

## 2013-11-18 ENCOUNTER — Telehealth: Payer: Self-pay | Admitting: Family Medicine

## 2013-11-18 NOTE — Telephone Encounter (Signed)
Pt called

## 2013-12-16 ENCOUNTER — Telehealth: Payer: Self-pay | Admitting: Nurse Practitioner

## 2013-12-16 ENCOUNTER — Encounter: Payer: Self-pay | Admitting: General Practice

## 2013-12-16 ENCOUNTER — Ambulatory Visit (INDEPENDENT_AMBULATORY_CARE_PROVIDER_SITE_OTHER): Payer: BC Managed Care – PPO | Admitting: General Practice

## 2013-12-16 VITALS — BP 139/80 | HR 82 | Temp 99.0°F | Ht 67.0 in | Wt 180.5 lb

## 2013-12-16 DIAGNOSIS — R05 Cough: Secondary | ICD-10-CM

## 2013-12-16 DIAGNOSIS — R509 Fever, unspecified: Secondary | ICD-10-CM

## 2013-12-16 DIAGNOSIS — J069 Acute upper respiratory infection, unspecified: Secondary | ICD-10-CM

## 2013-12-16 LAB — POCT RAPID STREP A (OFFICE): Rapid Strep A Screen: NEGATIVE

## 2013-12-16 LAB — POCT INFLUENZA A/B
Influenza A, POC: NEGATIVE
Influenza B, POC: NEGATIVE

## 2013-12-16 MED ORDER — GUAIFENESIN-CODEINE 100-10 MG/5ML PO SYRP: 5.0000 mL | ORAL_SOLUTION | Freq: Three times a day (TID) | ORAL | Status: DC | PRN

## 2013-12-16 MED ORDER — AZITHROMYCIN 250 MG PO TABS: ORAL_TABLET | ORAL | Status: DC

## 2013-12-16 NOTE — Progress Notes (Signed)
   Subjective:    Patient ID: Shelly Bennett, female    DOB: [birth year], 62 y.o.   MRN: [id number]  Cough This is a new problem. The current episode started in the past 7 days. The problem has been gradually worsening. The cough is productive of sputum. Associated symptoms include a fever, headaches, nasal congestion, postnasal drip and a sore throat. Pertinent negatives include no chest pain, chills or shortness of breath. The symptoms are aggravated by lying down. She has tried OTC cough suppressant for the symptoms. There is no history of asthma, bronchitis or pneumonia.  Fever  This is a new problem. The current episode started in the past 7 days. The problem occurs daily. The problem has been unchanged. The maximum temperature noted was 101 to 101.9 F. The temperature was taken using an oral thermometer. Associated symptoms include congestion, coughing, headaches and a sore throat. Pertinent negatives include no chest pain or vomiting. She has tried acetaminophen for the symptoms. The treatment provided mild relief.      Review of Systems  Constitutional: Positive for fever. Negative for chills.  HENT: Positive for congestion, postnasal drip and sore throat. Negative for sinus pressure.   Respiratory: Positive for cough. Negative for chest tightness and shortness of breath.   Cardiovascular: Negative for chest pain and palpitations.  Gastrointestinal: Negative for vomiting.  Neurological: Positive for headaches.  All other systems reviewed and are negative.       Objective:   Physical Exam  Constitutional: She is oriented to person, place, and time. She appears well-developed and well-nourished.  HENT:  Head: Normocephalic and atraumatic.  Right Ear: External ear normal.  Left Ear: External ear normal.  Mouth/Throat: Posterior oropharyngeal erythema present.  Cardiovascular: Normal rate, regular rhythm and normal heart sounds.   Pulmonary/Chest: Effort normal and breath  sounds normal. No respiratory distress. She exhibits no tenderness.  Neurological: She is alert and oriented to person, place, and time.  Skin: Skin is warm and dry.  Psychiatric: She has a normal mood and affect.          Assessment & Plan:  1. Fever  - POCT Influenza A/B - POCT rapid strep A  2. Upper respiratory infection  - azithromycin (ZITHROMAX) 250 MG tablet; Take as directed  Dispense: 6 tablet; Refill: 0  3. Cough  - guaiFENesin-codeine (CHERATUSSIN AC) 100-10 MG/5ML syrup; Take 5 mLs by mouth 3 (three) times daily as needed for cough.  Dispense: 120 mL; Refill: 0 -adequate fluids -avoid irritants -RTO if symptoms worsen or unresolved -Patient verbalized understanding Coralie Keens, FNP-C

## 2013-12-16 NOTE — Patient Instructions (Signed)

## 2013-12-16 NOTE — Telephone Encounter (Signed)
Patient high fever 101 to 102 body aches, chills, congestion, cough can we call her anything in. We are completley out of appointments for today.

## 2013-12-16 NOTE — Telephone Encounter (Signed)
I had a cancellation for today with mae so I scheduled patient with her

## 2013-12-19 ENCOUNTER — Other Ambulatory Visit: Payer: Self-pay | Admitting: Family Medicine

## 2013-12-28 ENCOUNTER — Ambulatory Visit (AMBULATORY_SURGERY_CENTER): Payer: Self-pay | Admitting: *Deleted

## 2013-12-28 ENCOUNTER — Encounter: Payer: Self-pay | Admitting: Internal Medicine

## 2013-12-28 VITALS — Ht 68.0 in | Wt 185.4 lb

## 2013-12-28 DIAGNOSIS — Z86010 Personal history of colonic polyps: Secondary | ICD-10-CM

## 2013-12-28 MED ORDER — NA SULFATE-K SULFATE-MG SULF 17.5-3.13-1.6 GM/177ML PO SOLN: ORAL | Status: DC

## 2013-12-29 ENCOUNTER — Encounter: Payer: Self-pay | Admitting: Internal Medicine

## 2014-01-04 ENCOUNTER — Encounter: Payer: Self-pay | Admitting: *Deleted

## 2014-01-11 ENCOUNTER — Encounter: Payer: BC Managed Care – PPO | Admitting: Internal Medicine

## 2014-01-14 ENCOUNTER — Ambulatory Visit (AMBULATORY_SURGERY_CENTER): Payer: BC Managed Care – PPO | Admitting: Internal Medicine

## 2014-01-14 ENCOUNTER — Encounter: Payer: Self-pay | Admitting: Internal Medicine

## 2014-01-14 VITALS — BP 132/78 | HR 53 | Temp 97.0°F | Resp 28 | Ht 68.0 in | Wt 185.0 lb

## 2014-01-14 DIAGNOSIS — D126 Benign neoplasm of colon, unspecified: Secondary | ICD-10-CM

## 2014-01-14 DIAGNOSIS — K5730 Diverticulosis of large intestine without perforation or abscess without bleeding: Secondary | ICD-10-CM

## 2014-01-14 DIAGNOSIS — Z800 Family history of malignant neoplasm of digestive organs: Secondary | ICD-10-CM | POA: Insufficient documentation

## 2014-01-14 DIAGNOSIS — Z86010 Personal history of colonic polyps: Secondary | ICD-10-CM

## 2014-01-14 MED ORDER — SODIUM CHLORIDE 0.9 % IV SOLN: 500.0000 mL | INTRAVENOUS | Status: DC

## 2014-01-14 NOTE — Op Note (Signed)
[place]  [address] [place], [address]   COLONOSCOPY PROCEDURE REPORT  PATIENT: Shelly Bennett, Shelly Bennett  MR#: [id number] BIRTHDATE: 1951-10-23 , 44  yrs. old GENDER: Female ENDOSCOPIST: Gatha Mayer, MD, [facility] PROCEDURE DATE:  01/14/2014 PROCEDURE:   Colonoscopy with snare polypectomy First Screening Colonoscopy - Avg.  risk and is 50 yrs.  old or older - No.  Prior Negative Screening - Now for repeat screening. N/A  History of Adenoma - Now for follow-up colonoscopy & has been > or = to 3 yrs.  Yes hx of adenoma.  Has been 3 or more years since last colonoscopy.  Polyps Removed Today? Yes. ASA CLASS:   Class II INDICATIONS:Last colonoscopy performed 5 years ago. MEDICATIONS: propofol (Diprivan) 150mg  IV, MAC sedation, administered by CRNA, and These medications were titrated to patient response per physician's verbal order  DESCRIPTION OF PROCEDURE:   After the risks benefits and alternatives of the procedure were thoroughly explained, informed consent was obtained.  A digital rectal exam revealed no abnormalities of the rectum.   The LB [id number]  endoscope was introduced through the anus and advanced to the cecum, which was identified by both the appendix and ileocecal valve. No adverse events experienced.   The quality of the prep was excellent using Suprep  The instrument was then slowly withdrawn as the colon was fully examined.  COLON FINDINGS: A diminutive sessile polyp was found in the sigmoid colon.  A polypectomy was performed with a cold snare.  The resection was complete and the polyp tissue was completely retrieved.   Moderate diverticulosis was noted in the sigmoid colon.   The colon mucosa was otherwise normal.   A right colon retroflexion was performed.  Retroflexed views revealed no abnormalities. The time to cecum=2 minutes 30 seconds.  Withdrawal time=9 minutes 50 seconds.  The scope was withdrawn and the procedure  completed. COMPLICATIONS: There were no complications.  ENDOSCOPIC IMPRESSION: 1.   Diminutive sessile polyp was found in the sigmoid colon; polypectomy was performed with a cold snare 2.   Moderate diverticulosis was noted in the sigmoid colon 3.   The colon mucosa was otherwise normal - excellent prep  RECOMMENDATIONS: Timing of repeat colonoscopy will be determined by pathology findings in patient w/ hx adenoma 2004 and FHx CRCA father and grandparent - anticipate 5 year repeat   eSigned:  Gatha Mayer, MD, [facility] 01/14/2014 3:02 PM   cc: The Patient

## 2014-01-14 NOTE — Progress Notes (Signed)
Lidocaine-40mg IV prior to Propofol InductionPropofol given over incremental dosages 

## 2014-01-14 NOTE — Patient Instructions (Addendum)
I found and removed one tiny benign-appearing polyp. You also have a condition called diverticulosis - common and not usually a problem. Please read the handout provided.   I will let you know pathology results and when to have another routine colonoscopy by mail. I think it will be in 5 years - 2020.  I appreciate the opportunity to care for you. Gatha Mayer, MD, FACG  YOU HAD AN ENDOSCOPIC PROCEDURE TODAY AT [facility]: Refer to the procedure report that was given to you for any specific questions about what was found during the examination.  If the procedure report does not answer your questions, please call your gastroenterologist to clarify.  If you requested that your care partner not be given the details of your procedure findings, then the procedure report has been included in a sealed envelope for you to review at your convenience later.  YOU SHOULD EXPECT: Some feelings of bloating in the abdomen. Passage of more gas than usual.  Walking can help get rid of the air that was put into your GI tract during the procedure and reduce the bloating. If you had a lower endoscopy (such as a colonoscopy or flexible sigmoidoscopy) you may notice spotting of blood in your stool or on the toilet paper. If you underwent a bowel prep for your procedure, then you may not have a normal bowel movement for a few days.  DIET: Your first meal following the procedure should be a light meal and then it is ok to progress to your normal diet.  A half-sandwich or bowl of soup is an example of a good first meal.  Heavy or fried foods are harder to digest and may make you feel nauseous or bloated.  Likewise meals heavy in dairy and vegetables can cause extra gas to form and this can also increase the bloating.  Drink plenty of fluids but you should avoid alcoholic beverages for 24 hours.  ACTIVITY: Your care partner should take you home directly after the procedure.  You should plan to take it  easy, moving slowly for the rest of the day.  You can resume normal activity the day after the procedure however you should NOT DRIVE or use heavy machinery for 24 hours (because of the sedation medicines used during the test).    SYMPTOMS TO REPORT IMMEDIATELY: A gastroenterologist can be reached at any hour.  During normal business hours, 8:30 AM to 5:00 PM Monday through Friday, call [phone].  After hours and on weekends, please call the GI answering service at [phone] who will take a message and have the physician on call contact you.   Following lower endoscopy (colonoscopy or flexible sigmoidoscopy):  Excessive amounts of blood in the stool  Significant tenderness or worsening of abdominal pains  Swelling of the abdomen that is new, acute  Fever of 100F or higher    FOLLOW UP: If any biopsies were taken you will be contacted by phone or by letter within the next 1-3 weeks.  Call your gastroenterologist if you have not heard about the biopsies in 3 weeks.  Our staff will call the home number listed on your records the next business day following your procedure to check on you and address any questions or concerns that you may have at that time regarding the information given to you following your procedure. This is a courtesy call and so if there is no answer at the home number and we have not heard  from you through the emergency physician on call, we will assume that you have returned to your regular daily activities without incident.  SIGNATURES/CONFIDENTIALITY: You and/or your care partner have signed paperwork which will be entered into your electronic medical record.  These signatures attest to the fact that that the information above on your After Visit Summary has been reviewed and is understood.  Full responsibility of the confidentiality of this discharge information lies with you and/or your care-partner.

## 2014-01-14 NOTE — Progress Notes (Signed)
Called to room to assist during endoscopic procedure.  Patient ID and intended procedure confirmed with present staff. Received instructions for my participation in the procedure from the performing physician.  

## 2014-01-15 ENCOUNTER — Telehealth: Payer: Self-pay

## 2014-01-15 NOTE — Addendum Note (Signed)
Addended by: Ernestine Conrad D on: 01/15/2014 03:03 PM   Modules accepted: Orders

## 2014-01-15 NOTE — Telephone Encounter (Signed)
  Follow up Call-  Call back number 01/14/2014  Post procedure Call Back phone  # [phone]  Permission to leave phone message Yes     Patient questions:  Do you have a fever, pain , or abdominal swelling? no Pain Score  0 *  Have you tolerated food without any problems? yes  Have you been able to return to your normal activities? yes  Do you have any questions about your discharge instructions: Diet   no Medications  no Follow up visit  no  Do you have questions or concerns about your Care? no  Actions: * If pain score is 4 or above: No action needed, pain <4.  No problems per the pt. Maw

## 2014-01-20 ENCOUNTER — Encounter: Payer: Self-pay | Admitting: Internal Medicine

## 2014-01-20 NOTE — Progress Notes (Signed)
Quick Note:  Diminutive adenoma - repeat colonoscopy 2020 ______ 

## 2014-01-28 ENCOUNTER — Other Ambulatory Visit: Payer: Self-pay | Admitting: Family Medicine

## 2014-02-25 ENCOUNTER — Other Ambulatory Visit: Payer: Self-pay | Admitting: Family Medicine

## 2014-03-15 ENCOUNTER — Ambulatory Visit: Payer: BC Managed Care – PPO | Admitting: Family Medicine

## 2014-03-17 ENCOUNTER — Ambulatory Visit: Payer: BC Managed Care – PPO | Admitting: Family Medicine

## 2014-03-29 ENCOUNTER — Other Ambulatory Visit: Payer: Self-pay | Admitting: Family Medicine

## 2014-04-14 ENCOUNTER — Ambulatory Visit (INDEPENDENT_AMBULATORY_CARE_PROVIDER_SITE_OTHER): Payer: BC Managed Care – PPO | Admitting: Family Medicine

## 2014-04-14 ENCOUNTER — Encounter: Payer: Self-pay | Admitting: Family Medicine

## 2014-04-14 ENCOUNTER — Ambulatory Visit (INDEPENDENT_AMBULATORY_CARE_PROVIDER_SITE_OTHER): Payer: BC Managed Care – PPO

## 2014-04-14 ENCOUNTER — Other Ambulatory Visit: Payer: Self-pay | Admitting: Family Medicine

## 2014-04-14 VITALS — BP 136/79 | HR 66 | Temp 97.5°F | Ht 68.0 in | Wt 184.0 lb

## 2014-04-14 DIAGNOSIS — M79671 Pain in right foot: Secondary | ICD-10-CM

## 2014-04-14 DIAGNOSIS — I2510 Atherosclerotic heart disease of native coronary artery without angina pectoris: Secondary | ICD-10-CM

## 2014-04-14 DIAGNOSIS — M25569 Pain in unspecified knee: Secondary | ICD-10-CM

## 2014-04-14 DIAGNOSIS — M79609 Pain in unspecified limb: Secondary | ICD-10-CM

## 2014-04-14 DIAGNOSIS — M25562 Pain in left knee: Secondary | ICD-10-CM

## 2014-04-14 DIAGNOSIS — E785 Hyperlipidemia, unspecified: Secondary | ICD-10-CM

## 2014-04-14 DIAGNOSIS — R52 Pain, unspecified: Secondary | ICD-10-CM

## 2014-04-14 DIAGNOSIS — E559 Vitamin D deficiency, unspecified: Secondary | ICD-10-CM

## 2014-04-14 DIAGNOSIS — I10 Essential (primary) hypertension: Secondary | ICD-10-CM

## 2014-04-14 MED ORDER — ROSUVASTATIN CALCIUM 40 MG PO TABS: 40.0000 mg | ORAL_TABLET | Freq: Every day | ORAL | Status: DC

## 2014-04-14 MED ORDER — MELOXICAM 7.5 MG PO TABS
7.5000 mg | ORAL_TABLET | Freq: Every day | ORAL | Status: DC
Start: 2014-04-14 — End: 2014-08-19

## 2014-04-14 NOTE — Progress Notes (Signed)
Subjective:    Patient ID: Shelly Bennett, female    DOB: 01-29-1951, 63 y.o.   MRN: [id number]  HPI Pt here for follow up and management of chronic medical problems. The patient today complains of sharp pains in the area of her left patella which come and go and are unrelated to activity standing or sitting. This is been going on for about a week. She also has had some right foot pain and swelling. She has a history of foot surgery on the right foot several years ago. The knee has been giving her trouble off and on for about a week. She is due to get lab work and this will be done when she returns to the office tomorrow. She is to check with her insurance regarding the Prevnar vaccine. She is taking omeprazole about 3 times a week and denies any problems with her stomach at this time.       Patient Active Problem List   Diagnosis Date Noted  . Family history of colon cancer-father at 8+ grandparent w/ rectal cancer 01/14/2014  . Osteopenia 08/12/2013  . CAD (coronary artery disease), native coronary artery 03/22/2013  . Aortic valve stenosis, mild 03/22/2013  . Hyperlipemia 03/11/2013  . Hypertension 03/11/2013  . Allergic rhinitis 03/11/2013  . Left shoulder pain 03/11/2013  . IBS (irritable bowel syndrome) - with chronic recurrent abdominal pain 02/01/2012  . Personal history of colonic polyps - adenoma 02/01/2012  . Coccydynia 02/01/2012   Outpatient Encounter Prescriptions as of 04/14/2014  Medication Sig  . amLODipine (NORVASC) 10 MG tablet TAKE ONE TABLET BY MOUTH ONE TIME DAILY  . aspirin 81 MG tablet Take 81 mg by mouth daily.   . calcium carbonate 200 MG capsule Take 250 mg by mouth 2 (two) times daily with a meal.  . Cholecalciferol (VITAMIN D-3) 5000 UNITS TABS Take 1 capsule by mouth daily. Mon thru Friday  . CRESTOR 40 MG tablet TAKE ONE TABLET BY MOUTH ONE TIME DAILY  . fexofenadine (ALLEGRA) 180 MG tablet Take 180 mg by mouth daily.  . hydrochlorothiazide  (HYDRODIURIL) 25 MG tablet TAKE ONE TABLET BY MOUTH ONE TIME DAILY  . NASONEX 50 MCG/ACT nasal spray USE TWO SPRAYS EACH NOSTRIL ONCE DAILY.  Marland Kitchen omega-3 acid ethyl esters (LOVAZA) 1 G capsule TAKE ONE CAPSULE BY MOUTH FOUR TIMES DAILY  . omeprazole (PRILOSEC) 20 MG capsule Take 20 mg by mouth daily. One by mouth Mon Wed Friday  . [DISCONTINUED] azelastine (ASTELIN) 137 MCG/SPRAY nasal spray Use in each nostril as directed 1-2 spray at bedtime  . desonide (DESOWEN) 0.05 % cream   . guaiFENesin (MUCINEX) 600 MG 12 hr tablet Take 1,200 mg by mouth. As needed  . [DISCONTINUED] guaiFENesin-codeine (CHERATUSSIN AC) 100-10 MG/5ML syrup Take 5 mLs by mouth 3 (three) times daily as needed for cough.  . [DISCONTINUED] LOTEMAX 0.5 % ophthalmic suspension     Review of Systems  Constitutional: Negative.   HENT: Negative.   Eyes: Negative.   Respiratory: Negative.   Cardiovascular: Negative.   Gastrointestinal: Negative.   Endocrine: Negative.   Genitourinary: Negative.   Musculoskeletal: Negative.        Right foot swelling and pain  Left knee pain "stabbing pain"  Skin: Negative.   Allergic/Immunologic: Negative.   Neurological: Negative.   Hematological: Negative.   Psychiatric/Behavioral: Negative.        Objective:   Physical Exam  Nursing note and vitals reviewed. Constitutional: She is oriented to person, place, and  time. She appears well-developed and well-nourished. No distress.  HENT:  Head: Normocephalic and atraumatic.  Right Ear: External ear normal.  Left Ear: External ear normal.  Mouth/Throat: Oropharynx is clear and moist.  Nasal congestion turbinate swelling bilateral  Eyes: Conjunctivae and EOM are normal. Pupils are equal, round, and reactive to light. Right eye exhibits no discharge. Left eye exhibits no discharge. No scleral icterus.  Neck: Normal range of motion. Neck supple. No thyromegaly present.  Cardiovascular: Normal rate, regular rhythm and intact distal  pulses.  Exam reveals no gallop and no friction rub.   Murmur heard. At 60 per minute with a [id number]/4 systolic ejection murmur  Pulmonary/Chest: Effort normal and breath sounds normal. No respiratory distress. She has no wheezes. She has no rales. She exhibits no tenderness.  Abdominal: Soft. Bowel sounds are normal. She exhibits no mass. There is tenderness. There is no rebound and no guarding.  Slight left lower quadrant tenderness and no epigastric tenderness  Musculoskeletal: Normal range of motion. She exhibits no edema and no tenderness.  Good range of motion of left knee with flexion and extension and no joint line pain or tenderness. There is no swelling or edema about the knee that was noticeable. There was no patella edema either.  Lymphadenopathy:    She has no cervical adenopathy.  Neurological: She is alert and oriented to person, place, and time. She has normal reflexes. No cranial nerve deficit.  Skin: Skin is warm and dry. No rash noted.  Psychiatric: She has a normal mood and affect. Her behavior is normal. Judgment and thought content normal.   BP 136/79  Pulse 66  Temp(Src) 97.5 F (36.4 C) (Oral)  Ht _0  (1.727 m)  Wt 184 lb (83.462 kg)  BMI 27.98 kg/m2  [facility] reading (PRIMARY) by  Dr. Louretta Parma knee-Wnl                                                                        Right foot- Deg Changes                                   Assessment & Plan:   1. CAD (coronary artery disease), native coronary artery - POCT CBC; Future -Continue yearly followup with cardiology  2. Hyperlipemia - POCT CBC; Future - NMR, lipoprofile; Future  3. Hypertension - POCT CBC; Future - BMP8+EGFR; Future - Hepatic function panel; Future  4. Vitamin D deficiency - POCT CBC; Future - Vit D  25 hydroxy (rtn osteoporosis monitoring); Future  5. Knee pain, left - meloxicam (MOBIC) 7.5 MG tablet; Take 1 tablet (7.5 mg total) by mouth daily.  Dispense: 30 tablet; Refill:  0  6. Foot pain, right - meloxicam (MOBIC) 7.5 MG tablet; Take 1 tablet (7.5 mg total) by mouth daily.  Dispense: 30 tablet; Refill: 0  Patient Instructions  Continue current medications. Continue good therapeutic lifestyle changes which include good diet and exercise. Fall precautions discussed with patient. If an FOBT was given today- please return it to our front desk. If you are over 79 years old - you may need Prevnar 66 or the adult Pneumonia vaccine.  Take medication  as directed, if it bothers her stomach discontinue it, take the meloxicam after breakfast daily While you were taking the meloxicam, make sure that you take the omeprazole on a daily basis Use warm wet compresses to the left knee and the right foot We will call you with the results of the x-rays and lab work and since those results are available   Arrie Senate MD

## 2014-04-14 NOTE — Patient Instructions (Addendum)
Continue current medications. Continue good therapeutic lifestyle changes which include good diet and exercise. Fall precautions discussed with patient. If an FOBT was given today- please return it to our front desk. If you are over 64 years old - you may need Prevnar 18 or the adult Pneumonia vaccine.  Take medication as directed, if it bothers her stomach discontinue it, take the meloxicam after breakfast daily While you were taking the meloxicam, make sure that you take the omeprazole on a daily basis Use warm wet compresses to the left knee and the right foot We will call you with the results of the x-rays and lab work and since those results are available

## 2014-04-15 ENCOUNTER — Other Ambulatory Visit (INDEPENDENT_AMBULATORY_CARE_PROVIDER_SITE_OTHER): Payer: BC Managed Care – PPO

## 2014-04-15 DIAGNOSIS — E559 Vitamin D deficiency, unspecified: Secondary | ICD-10-CM

## 2014-04-15 DIAGNOSIS — E785 Hyperlipidemia, unspecified: Secondary | ICD-10-CM

## 2014-04-15 DIAGNOSIS — I2510 Atherosclerotic heart disease of native coronary artery without angina pectoris: Secondary | ICD-10-CM

## 2014-04-15 DIAGNOSIS — I10 Essential (primary) hypertension: Secondary | ICD-10-CM

## 2014-04-15 NOTE — Addendum Note (Signed)
Addended by: Pollyann Kennedy F on: 04/15/2014 12:45 PM   Modules accepted: Orders

## 2014-04-15 NOTE — Progress Notes (Signed)
Pt came in for lab  only 

## 2014-04-16 LAB — CBC WITH DIFFERENTIAL
Basophils Absolute: 0 10*3/uL (ref 0.0–0.2)
Basos: 0 %
EOS: 2 %
Eosinophils Absolute: 0.1 10*3/uL (ref 0.0–0.4)
HEMATOCRIT: 38.7 % (ref 34.0–46.6)
Hemoglobin: 13.2 g/dL (ref 11.1–15.9)
IMMATURE GRANULOCYTES: 0 %
Immature Grans (Abs): 0 10*3/uL (ref 0.0–0.1)
LYMPHS ABS: 1.5 10*3/uL (ref 0.7–3.1)
Lymphs: 28 %
MCH: 30.6 pg (ref 26.6–33.0)
MCHC: 34.1 g/dL (ref 31.5–35.7)
MCV: 90 fL (ref 79–97)
Monocytes Absolute: 0.4 10*3/uL (ref 0.1–0.9)
Monocytes: 7 %
Neutrophils Absolute: 3.2 10*3/uL (ref 1.4–7.0)
Neutrophils Relative %: 63 %
Platelets: 260 10*3/uL (ref 150–379)
RBC: 4.32 x10E6/uL (ref 3.77–5.28)
RDW: 14.2 % (ref 12.3–15.4)
WBC: 5.2 10*3/uL (ref 3.4–10.8)

## 2014-04-16 LAB — HEPATIC FUNCTION PANEL
ALT: 16 IU/L (ref 0–32)
AST: 22 IU/L (ref 0–40)
Albumin: 4.1 g/dL (ref 3.6–4.8)
Alkaline Phosphatase: 69 IU/L (ref 39–117)
BILIRUBIN DIRECT: 0.15 mg/dL (ref 0.00–0.40)
TOTAL PROTEIN: 6.2 g/dL (ref 6.0–8.5)
Total Bilirubin: 0.5 mg/dL (ref 0.0–1.2)

## 2014-04-16 LAB — BMP8+EGFR
BUN/Creatinine Ratio: 17 (ref 11–26)
BUN: 13 mg/dL (ref 8–27)
CALCIUM: 9.5 mg/dL (ref 8.7–10.3)
CO2: 24 mmol/L (ref 18–29)
Chloride: 104 mmol/L (ref 97–108)
Creatinine, Ser: 0.75 mg/dL (ref 0.57–1.00)
GFR calc Af Amer: 98 mL/min/{1.73_m2} (ref >59)
GFR, EST NON AFRICAN AMERICAN: 85 mL/min/{1.73_m2} (ref >59)
GLUCOSE: 90 mg/dL (ref 65–99)
Potassium: 3.8 mmol/L (ref 3.5–5.2)
SODIUM: 144 mmol/L (ref 134–144)

## 2014-04-16 LAB — NMR, LIPOPROFILE
CHOLESTEROL: 141 mg/dL (ref <200)
HDL CHOLESTEROL BY NMR: 59 mg/dL (ref >=40)
HDL Particle Number: 38.3 umol/L (ref >=30.5)
LDL Particle Number: 1002 nmol/L — ABNORMAL HIGH (ref <1000)
LDL SIZE: 20.8 nm (ref >20.5)
LDLC SERPL CALC-MCNC: 70 mg/dL (ref <100)
LP-IR Score: <25 (ref <=45)
Small LDL Particle Number: 610 nmol/L — ABNORMAL HIGH (ref <=527)
TRIGLYCERIDES BY NMR: 60 mg/dL (ref <150)

## 2014-04-16 LAB — VITAMIN D 25 HYDROXY (VIT D DEFICIENCY, FRACTURES): Vit D, 25-Hydroxy: 63.1 ng/mL (ref 30.0–100.0)

## 2014-04-27 ENCOUNTER — Telehealth: Payer: Self-pay | Admitting: Family Medicine

## 2014-04-27 NOTE — Telephone Encounter (Signed)
If there is a big cost difference, she can go with the generic fish oil over the branded fish oil . Check the cost of the following fish oils your insurance, the second two are branded:   [place]

## 2014-04-27 NOTE — Telephone Encounter (Signed)
Pt aware of lab reuslts.  Is she to continue on generic fish oil or go back on brand name fish oil?  [facility] wanted to see the numbers and was to let her know generic or brand fish oil to take.  The generic is less expensive.  Call her at [phone] to let her know

## 2014-04-27 NOTE — Telephone Encounter (Signed)
Message copied by Cline Crock on Tue Apr 27, 2014 11:37 AM ------      Message from: Chipper Herb      Created: Fri Apr 16, 2014  8:57 AM       The CBC has a normal white blood cell count. The hemoglobin remained stable and good at 13.2. The platelet count is adequate.      Blood sugar renal and creatinine and electrolytes are all within normal limit      Liver function tests are within normal limit      The total LDL particle number remains slightly elevated at 1002. It is however lower than it was 5 months ago when it was 1105. The LDL C. is good at 70. The triglycerides are good. The HDL particle number is good.------------------- continue current treatment and has aggressive therapy lifestyle changes as possible      The vitamin D level is good at 63.1, continue current treatment ------

## 2014-04-28 NOTE — Telephone Encounter (Signed)
Pt aware she can take generic fish oil

## 2014-05-19 ENCOUNTER — Telehealth: Payer: Self-pay | Admitting: Family Medicine

## 2014-05-19 MED ORDER — DOXYCYCLINE HYCLATE 100 MG PO TABS: 100.0000 mg | ORAL_TABLET | Freq: Two times a day (BID) | ORAL | Status: DC

## 2014-05-19 NOTE — Telephone Encounter (Signed)
Doxycycline 100 mg twice daily for 10 days Saline nose spray Take Tylenol for aches pains and fever

## 2014-05-19 NOTE — Telephone Encounter (Signed)
Patient aware and rx sent to pharmacy.  

## 2014-06-11 ENCOUNTER — Other Ambulatory Visit: Payer: Self-pay | Admitting: Family Medicine

## 2014-07-09 DIAGNOSIS — B0050 Herpesviral ocular disease, unspecified: Secondary | ICD-10-CM | POA: Insufficient documentation

## 2014-07-12 ENCOUNTER — Other Ambulatory Visit: Payer: Self-pay | Admitting: Family Medicine

## 2014-08-19 ENCOUNTER — Encounter: Payer: Self-pay | Admitting: Family Medicine

## 2014-08-19 ENCOUNTER — Ambulatory Visit (INDEPENDENT_AMBULATORY_CARE_PROVIDER_SITE_OTHER): Payer: BC Managed Care – PPO

## 2014-08-19 ENCOUNTER — Ambulatory Visit (INDEPENDENT_AMBULATORY_CARE_PROVIDER_SITE_OTHER): Payer: BC Managed Care – PPO | Admitting: Family Medicine

## 2014-08-19 VITALS — BP 150/79 | HR 62 | Temp 97.9°F | Ht 68.0 in | Wt 184.0 lb

## 2014-08-19 DIAGNOSIS — G589 Mononeuropathy, unspecified: Secondary | ICD-10-CM

## 2014-08-19 DIAGNOSIS — E559 Vitamin D deficiency, unspecified: Secondary | ICD-10-CM

## 2014-08-19 DIAGNOSIS — R194 Change in bowel habit: Secondary | ICD-10-CM

## 2014-08-19 DIAGNOSIS — M79642 Pain in left hand: Secondary | ICD-10-CM

## 2014-08-19 DIAGNOSIS — G629 Polyneuropathy, unspecified: Secondary | ICD-10-CM

## 2014-08-19 DIAGNOSIS — I10 Essential (primary) hypertension: Secondary | ICD-10-CM

## 2014-08-19 DIAGNOSIS — E785 Hyperlipidemia, unspecified: Secondary | ICD-10-CM

## 2014-08-19 DIAGNOSIS — M79609 Pain in unspecified limb: Secondary | ICD-10-CM

## 2014-08-19 DIAGNOSIS — R300 Dysuria: Secondary | ICD-10-CM

## 2014-08-19 DIAGNOSIS — R198 Other specified symptoms and signs involving the digestive system and abdomen: Secondary | ICD-10-CM

## 2014-08-19 LAB — POCT CBC
Granulocyte percent: 70.7 %G (ref 37–80)
HCT, POC: 38.8 % (ref 37.7–47.9)
Hemoglobin: 13.3 g/dL (ref 12.2–16.2)
Lymph, poc: 1.6 (ref 0.6–3.4)
MCH, POC: 30.8 pg (ref 27–31.2)
MCHC: 34.2 g/dL (ref 31.8–35.4)
MCV: 89.9 fL (ref 80–97)
MPV: 7.8 fL (ref 0–99.8)
PLATELET COUNT, POC: 234 10*3/uL (ref 142–424)
POC Granulocyte: 4.2 (ref 2–6.9)
POC LYMPH PERCENT: 26 %L (ref 10–50)
RBC: 4.3 M/uL (ref 4.04–5.48)
RDW, POC: 13.9 %
WBC: 6 10*3/uL (ref 4.6–10.2)

## 2014-08-19 LAB — POCT URINALYSIS DIPSTICK
Bilirubin, UA: NEGATIVE
Glucose, UA: NEGATIVE
KETONES UA: NEGATIVE
Nitrite, UA: NEGATIVE
Protein, UA: NEGATIVE
Urobilinogen, UA: NEGATIVE
pH, UA: 8

## 2014-08-19 LAB — POCT UA - MICROSCOPIC ONLY
Bacteria, U Microscopic: NEGATIVE
CRYSTALS, UR, HPF, POC: NEGATIVE
Casts, Ur, LPF, POC: NEGATIVE
Mucus, UA: NEGATIVE
Yeast, UA: NEGATIVE

## 2014-08-19 MED ORDER — CIPROFLOXACIN HCL 500 MG PO TABS: 500.0000 mg | ORAL_TABLET | Freq: Two times a day (BID) | ORAL | Status: DC

## 2014-08-19 NOTE — Patient Instructions (Addendum)
Continue current medications. Continue good therapeutic lifestyle changes which include good diet and exercise. Fall precautions discussed with patient. If an FOBT was given today- please return it to our front desk. If you are over 64 years old - you may need Prevnar 78 or the adult Pneumonia vaccine.  Flu Shots will be available at our office starting mid- September. Please call and schedule a FLU CLINIC APPOINTMENT.   We will arrange an appointment for you to see the orthopedist, Dr. Veneda Melter a Band-Aid as directed on your index finger of your left hand at nighttime If the finger continues to lock up on you, have Dr. Durward Fortes look at this also Make sure that you take your blood pressure medicines daily record blood pressure readings and return those for review in 2-3 weeks Watch her sodium intake check with your insurance regarding the Prevnar vaccine We will call you with the lab results as soon as possible and please return the stool for C. difficile evaluation Keep regular appointment with cardiologist

## 2014-08-19 NOTE — Progress Notes (Signed)
Subjective:    Patient ID: Shelly Bennett, female    DOB: 10/08/1951, 63 y.o.   MRN: [id number]  HPI Pt here for follow up and management of chronic medical problems. The patient does have several complaints today. She is having some problems with her left hand, right foot, left side of her abdomen and some issues with her urine in its appearance without any symptoms regarding this. She will get lab work today and a chest x-ray today. She will check with her insurance regarding the Prevnar vaccine.         Patient Active Problem List   Diagnosis Date Noted  . Family history of colon cancer-father at 48+ grandparent w/ rectal cancer 01/14/2014  . Osteopenia 08/12/2013  . CAD (coronary artery disease), native coronary artery 03/22/2013  . Aortic valve stenosis, mild 03/22/2013  . Hyperlipemia 03/11/2013  . Hypertension 03/11/2013  . Allergic rhinitis 03/11/2013  . Left shoulder pain 03/11/2013  . IBS (irritable bowel syndrome) - with chronic recurrent abdominal pain 02/01/2012  . Personal history of colonic polyps - adenoma 02/01/2012  . Coccydynia 02/01/2012   Outpatient Encounter Prescriptions as of 08/19/2014  Medication Sig  . amLODipine (NORVASC) 10 MG tablet TAKE ONE TABLET BY MOUTH ONE TIME DAILY  . aspirin 81 MG tablet Take 81 mg by mouth daily.   . calcium carbonate 200 MG capsule Take 250 mg by mouth 2 (two) times daily with a meal.  . Cholecalciferol (VITAMIN D-3) 5000 UNITS TABS Take 1 capsule by mouth daily. Mon thru Friday  . desonide (DESOWEN) 0.05 % cream   . fexofenadine (ALLEGRA) 180 MG tablet Take 180 mg by mouth daily.  Marland Kitchen guaiFENesin (MUCINEX) 600 MG 12 hr tablet Take 1,200 mg by mouth. As needed  . hydrochlorothiazide (HYDRODIURIL) 25 MG tablet TAKE ONE TABLET BY MOUTH ONE TIME DAILY  . LOTEMAX 0.5 % ophthalmic suspension   . NASONEX 50 MCG/ACT nasal spray USE TWO SPRAYS EACH NOSTRIL ONCE DAILY.  Marland Kitchen omega-3 acid ethyl esters (LOVAZA) 1 G capsule TAKE ONE  CAPSULE BY MOUTH FOUR TIMES DAILY  . omeprazole (PRILOSEC) 20 MG capsule Take 20 mg by mouth daily. One by mouth Mon Wed Friday  . rosuvastatin (CRESTOR) 40 MG tablet Take 1 tablet (40 mg total) by mouth daily. As directed  . valACYclovir (VALTREX) 1000 MG tablet Take 1 tablet by mouth daily.  . [DISCONTINUED] doxycycline (VIBRA-TABS) 100 MG tablet Take 1 tablet (100 mg total) by mouth 2 (two) times daily.  . [DISCONTINUED] meloxicam (MOBIC) 7.5 MG tablet Take 1 tablet (7.5 mg total) by mouth daily.    Review of Systems  Constitutional: Negative.   HENT: Negative.   Eyes: Negative.   Respiratory: Negative.   Cardiovascular: Negative.   Gastrointestinal: Negative.   Endocrine: Negative.   Genitourinary: Positive for dysuria.  Musculoskeletal: Positive for arthralgias (right foot pain at times).       Left palm of hand- tightness  Skin: Negative.        Skin feels painful on left side of abdomen  Allergic/Immunologic: Negative.   Neurological: Negative.   Hematological: Negative.   Psychiatric/Behavioral: Negative.        Objective:   Physical Exam  Nursing note and vitals reviewed. Constitutional: She is oriented to person, place, and time. She appears well-developed and well-nourished. No distress.  HENT:  Head: Normocephalic and atraumatic.  Right Ear: External ear normal.  Left Ear: External ear normal.  Mouth/Throat: Oropharynx is clear and moist. No  oropharyngeal exudate.  Nasal congestion bilaterally  Eyes: Conjunctivae and EOM are normal. Pupils are equal, round, and reactive to light. Right eye exhibits no discharge. Left eye exhibits no discharge. No scleral icterus.  There is slight swelling in the right, she is being followed by specialists for a possible herpetic infection in the cornea  Neck: Normal range of motion. Neck supple. No JVD present. No thyromegaly present.  No carotid bruits  Cardiovascular: Normal rate, regular rhythm, normal heart sounds and intact  distal pulses.  Exam reveals no gallop and no friction rub.   No murmur heard. At 72 per minute  Pulmonary/Chest: Effort normal and breath sounds normal. No respiratory distress. She has no wheezes. She has no rales. She exhibits no tenderness.  Axillary is negative  Abdominal: Soft. Bowel sounds are normal. She exhibits no mass. There is no tenderness. There is no rebound and no guarding.  There are no abdominal bruits. There is tenderness in the upper abdomen both the right and left upper quadrants. There no masses.  Musculoskeletal: Normal range of motion. She exhibits no edema and no tenderness.  Generalized right foot pain which is ongoing. She has a history of foot surgery in the past.  Lymphadenopathy:    She has no cervical adenopathy.  Neurological: She is alert and oriented to person, place, and time. She has normal reflexes. No cranial nerve deficit.  Skin: Skin is warm and dry. No rash noted.  Psychiatric: She has a normal mood and affect. Her behavior is normal. Judgment and thought content normal.   BP 150/79  Pulse 62  Temp(Src) 97.9 F (36.6 C) (Oral)  Ht $R'5\' 8"'Ig$  (1.727 m)  Wt 184 lb (83.462 kg)  BMI 27.98 kg/m2  [facility] reading (PRIMARY) by  Dr.Ailee Pates-chest x-ray, LS-spine--   : Chest x-ray no active disease but joint space narrowing. On lumbar films some scoliosis and joint space narrowing                                    Assessment & Plan:  1. Hyperlipemia - POCT CBC - NMR, lipoprofile  2. Hypertension - POCT CBC - BMP8+EGFR - Hepatic function panel - DG Chest 2 View; Future  3. Vitamin D deficiency - POCT CBC - Vit D  25 hydroxy (rtn osteoporosis monitoring)  4. Dysuria - POCT CBC - POCT UA - Microscopic Only - POCT urinalysis dipstick - Urine culture  5. Neuropathy - DG Lumbar Spine 2-3 Views; Future  6. Left hand pain  7. Change in bowel habits -C. difficile culture  Meds ordered this encounter  Medications  . valACYclovir (VALTREX) 1000  MG tablet    Sig: Take 1 tablet by mouth daily.  Marland Kitchen LOTEMAX 0.5 % ophthalmic suspension    Sig:    Patient Instructions  Continue current medications. Continue good therapeutic lifestyle changes which include good diet and exercise. Fall precautions discussed with patient. If an FOBT was given today- please return it to our front desk. If you are over 69 years old - you may need Prevnar 38 or the adult Pneumonia vaccine.  Flu Shots will be available at our office starting mid- September. Please call and schedule a FLU CLINIC APPOINTMENT.   We will arrange an appointment for you to see the orthopedist, Dr. Veneda Melter a Band-Aid as directed on your index finger of your left hand at nighttime If the finger continues to lock  up on you, have Dr. Durward Fortes look at this also Make sure that you take your blood pressure medicines daily record blood pressure readings and return those for review in 2-3 weeks Watch her sodium intake check with your insurance regarding the Prevnar vaccine We will call you with the lab results as soon as possible and please return the stool for C. difficile evaluation Keep regular appointment with cardiologist      Arrie Senate MD

## 2014-08-20 ENCOUNTER — Telehealth: Payer: Self-pay

## 2014-08-20 LAB — HEPATIC FUNCTION PANEL
ALBUMIN: 4.4 g/dL (ref 3.6–4.8)
ALK PHOS: 68 IU/L (ref 39–117)
ALT: 19 IU/L (ref 0–32)
AST: 20 IU/L (ref 0–40)
BILIRUBIN DIRECT: 0.12 mg/dL (ref 0.00–0.40)
TOTAL PROTEIN: 6.6 g/dL (ref 6.0–8.5)
Total Bilirubin: 0.5 mg/dL (ref 0.0–1.2)

## 2014-08-20 LAB — NMR, LIPOPROFILE
Cholesterol: 155 mg/dL (ref 100–199)
HDL Cholesterol by NMR: 64 mg/dL (ref >39)
HDL PARTICLE NUMBER: 39 umol/L (ref >=30.5)
LDL PARTICLE NUMBER: 928 nmol/L (ref <1000)
LDL SIZE: 20.5 nm (ref >20.5)
LDLC SERPL CALC-MCNC: 74 mg/dL (ref 0–99)
LP-IR SCORE: 30 (ref <=45)
Small LDL Particle Number: 402 nmol/L (ref <=527)
Triglycerides by NMR: 85 mg/dL (ref 0–149)

## 2014-08-20 LAB — VITAMIN D 25 HYDROXY (VIT D DEFICIENCY, FRACTURES): Vit D, 25-Hydroxy: 65.4 ng/mL (ref 30.0–100.0)

## 2014-08-20 LAB — BMP8+EGFR
BUN / CREAT RATIO: 14 (ref 11–26)
BUN: 11 mg/dL (ref 8–27)
CO2: 25 mmol/L (ref 18–29)
Calcium: 9.6 mg/dL (ref 8.7–10.3)
Chloride: 102 mmol/L (ref 97–108)
Creatinine, Ser: 0.76 mg/dL (ref 0.57–1.00)
GFR, EST AFRICAN AMERICAN: 96 mL/min/{1.73_m2} (ref >59)
GFR, EST NON AFRICAN AMERICAN: 83 mL/min/{1.73_m2} (ref >59)
GLUCOSE: 92 mg/dL (ref 65–99)
POTASSIUM: 3.8 mmol/L (ref 3.5–5.2)
Sodium: 140 mmol/L (ref 134–144)

## 2014-08-20 NOTE — Telephone Encounter (Signed)
Message copied by Koren Bound on Fri Aug 20, 2014  9:03 AM ------      Message from: Chipper Herb      Created: Thu Aug 19, 2014  1:27 PM       As per radiology report ------

## 2014-08-20 NOTE — Telephone Encounter (Signed)
Message copied by Koren Bound on Fri Aug 20, 2014  9:04 AM ------      Message from: Chipper Herb      Created: Thu Aug 19, 2014  5:18 PM       As per radiology report--- please give details above 2 patient with spurring disc space narrowing and degenerative changes      These kinds of changes are most likely the reason for the pain on the left side that she described at the visit today ------

## 2014-08-20 NOTE — Telephone Encounter (Signed)
Pt aware of xray results

## 2014-08-21 LAB — URINE CULTURE

## 2014-08-25 ENCOUNTER — Telehealth: Payer: Self-pay | Admitting: Family Medicine

## 2014-08-25 NOTE — Telephone Encounter (Signed)
Message copied by Waverly Ferrari on Wed Aug 25, 2014 12:27 PM ------      Message from: Chipper Herb      Created: Sat Aug 21, 2014  8:44 AM       All liver function tests are within normal limit      All cholesterol numbers with advanced lipid testing are excellent and at goal, continue current treatment and aggressive therapeutic lifestyle changes      The vitamin D level is good and within normal limits, continue current treatment      The urine culture is still pending appear ------

## 2014-09-18 ENCOUNTER — Other Ambulatory Visit: Payer: Self-pay | Admitting: Family Medicine

## 2014-10-06 ENCOUNTER — Other Ambulatory Visit (INDEPENDENT_AMBULATORY_CARE_PROVIDER_SITE_OTHER): Payer: BC Managed Care – PPO

## 2014-10-06 ENCOUNTER — Ambulatory Visit (INDEPENDENT_AMBULATORY_CARE_PROVIDER_SITE_OTHER): Payer: BC Managed Care – PPO

## 2014-10-06 DIAGNOSIS — Z23 Encounter for immunization: Secondary | ICD-10-CM

## 2014-10-06 DIAGNOSIS — R319 Hematuria, unspecified: Secondary | ICD-10-CM

## 2014-10-06 DIAGNOSIS — N390 Urinary tract infection, site not specified: Secondary | ICD-10-CM

## 2014-10-06 LAB — POCT URINALYSIS DIPSTICK
Bilirubin, UA: NEGATIVE
Glucose, UA: NEGATIVE
Ketones, UA: NEGATIVE
NITRITE UA: NEGATIVE
PH UA: 6
PROTEIN UA: NEGATIVE
Spec Grav, UA: 1.02
UROBILINOGEN UA: NEGATIVE

## 2014-10-06 LAB — POCT UA - MICROSCOPIC ONLY
CASTS, UR, LPF, POC: NEGATIVE
Crystals, Ur, HPF, POC: NEGATIVE
Mucus, UA: NEGATIVE
WBC, Ur, HPF, POC: 1.5
YEAST UA: NEGATIVE

## 2014-10-07 ENCOUNTER — Telehealth: Payer: Self-pay | Admitting: *Deleted

## 2014-10-07 NOTE — Telephone Encounter (Signed)
Urine is clear

## 2014-10-07 NOTE — Telephone Encounter (Signed)
Message copied by Shelbie Ammons on Thu Oct 07, 2014  9:50 AM ------      Message from: Chevis Pretty      Created: Thu Oct 07, 2014  9:00 AM       Urine clear ------

## 2014-10-22 ENCOUNTER — Telehealth: Payer: Self-pay | Admitting: Nurse Practitioner

## 2014-10-22 DIAGNOSIS — D649 Anemia, unspecified: Secondary | ICD-10-CM

## 2014-10-25 NOTE — Telephone Encounter (Signed)
Get CBC and FOBT as planned

## 2014-10-25 NOTE — Telephone Encounter (Signed)
Dr. Ulanda Edison check up hgb - 11.5 at gyn  Colon in jan 2015   Last hgb here was 13.3 (08/2014)  fobt- put up front Cbc- ordered [facility]- Any other recommendations?

## 2014-10-25 NOTE — Telephone Encounter (Signed)
Returning [place] call.  Call Yazmine at [phone]

## 2014-10-25 NOTE — Telephone Encounter (Signed)
LM - 11/9- jhb

## 2014-10-26 ENCOUNTER — Encounter (INDEPENDENT_AMBULATORY_CARE_PROVIDER_SITE_OTHER): Payer: Self-pay

## 2014-10-26 ENCOUNTER — Other Ambulatory Visit (INDEPENDENT_AMBULATORY_CARE_PROVIDER_SITE_OTHER): Payer: BC Managed Care – PPO

## 2014-10-26 DIAGNOSIS — D649 Anemia, unspecified: Secondary | ICD-10-CM

## 2014-10-26 LAB — POCT CBC
Granulocyte percent: 74.9 %G (ref 37–80)
HCT, POC: 38.6 % (ref 37.7–47.9)
HEMOGLOBIN: 13.8 g/dL (ref 12.2–16.2)
LYMPH, POC: 1.6 (ref 0.6–3.4)
MCH: 32.6 pg — AB (ref 27–31.2)
MCHC: 35.8 g/dL — AB (ref 31.8–35.4)
MCV: 91.2 fL (ref 80–97)
MPV: 7.5 fL (ref 0–99.8)
PLATELET COUNT, POC: 230 10*3/uL (ref 142–424)
POC Granulocyte: 6.4 (ref 2–6.9)
POC LYMPH PERCENT: 18.3 %L (ref 10–50)
RBC: 4.2 M/uL (ref 4.04–5.48)
RDW, POC: 13 %
WBC: 8.6 10*3/uL (ref 4.6–10.2)

## 2014-10-27 ENCOUNTER — Telehealth: Payer: Self-pay

## 2014-10-27 NOTE — Telephone Encounter (Signed)
Pt aware of results ;Will still bring stool sample

## 2014-10-27 NOTE — Telephone Encounter (Signed)
-----   Message from Chipper Herb, MD sent at 10/26/2014  7:11 PM EST ----- The CBC has a normal white blood cell count. The hemoglobin is stable and slightly improved at 13.8.the platelet count is adequate.

## 2014-10-30 ENCOUNTER — Ambulatory Visit (INDEPENDENT_AMBULATORY_CARE_PROVIDER_SITE_OTHER): Payer: BC Managed Care – PPO | Admitting: Family Medicine

## 2014-10-30 ENCOUNTER — Encounter: Payer: Self-pay | Admitting: Family Medicine

## 2014-10-30 VITALS — BP 132/80 | HR 82 | Temp 99.2°F | Ht 68.0 in | Wt 187.6 lb

## 2014-10-30 DIAGNOSIS — J209 Acute bronchitis, unspecified: Secondary | ICD-10-CM

## 2014-10-30 DIAGNOSIS — J019 Acute sinusitis, unspecified: Secondary | ICD-10-CM

## 2014-10-30 DIAGNOSIS — R05 Cough: Secondary | ICD-10-CM

## 2014-10-30 DIAGNOSIS — R059 Cough, unspecified: Secondary | ICD-10-CM

## 2014-10-30 DIAGNOSIS — J40 Bronchitis, not specified as acute or chronic: Secondary | ICD-10-CM

## 2014-10-30 LAB — POCT CBC
Granulocyte percent: 79.8 %G (ref 37–80)
HCT, POC: 39.9 % (ref 37.7–47.9)
HEMOGLOBIN: 13 g/dL (ref 12.2–16.2)
LYMPH, POC: 1.5 (ref 0.6–3.4)
MCH: 30.1 pg (ref 27–31.2)
MCHC: 32.7 g/dL (ref 31.8–35.4)
MCV: 92.1 fL (ref 80–97)
MPV: 7.7 fL (ref 0–99.8)
PLATELET COUNT, POC: 249 10*3/uL (ref 142–424)
POC GRANULOCYTE: 7.7 — AB (ref 2–6.9)
POC LYMPH %: 15.1 % (ref 10–50)
RBC: 4.3 M/uL (ref 4.04–5.48)
RDW, POC: 13 %
WBC: 9.7 10*3/uL (ref 4.6–10.2)

## 2014-10-30 MED ORDER — CEFDINIR 300 MG PO CAPS: 300.0000 mg | ORAL_CAPSULE | Freq: Two times a day (BID) | ORAL | Status: DC

## 2014-10-30 MED ORDER — HYDROCOD POLST-CHLORPHEN POLST 10-8 MG/5ML PO LQCR: 5.0000 mL | Freq: Every evening | ORAL | Status: DC | PRN

## 2014-10-30 NOTE — Patient Instructions (Signed)
Take Mucinex maximum strength, blue and white in color, 1 twice daily with a large glass of water Use saline nose spray frequently Drink plenty of fluids Take stronger cough medicine at nighttime as needed for severe cough

## 2014-10-30 NOTE — Progress Notes (Signed)
   Subjective:    Patient ID: Shelly Bennett, female    DOB: [birth year], 63 y.o.   MRN: [id number]  HPI Pt is here today for cough, congestion, sore throat, headache and Left ear pain that started on Monday.  She has also had a low grade fever.         Review of Systems  Constitutional: Positive for fever.  HENT: Positive for congestion (started on Monday) and ear pain (left).   Respiratory: Positive for cough.   Neurological: Positive for headaches.       Objective:   Physical Exam  Constitutional: She is oriented to person, place, and time. She appears well-developed and well-nourished. She appears distressed.  HENT:  Head: Normocephalic and atraumatic.  Right Ear: External ear normal.  Left Ear: External ear normal.  Mouth/Throat: No oropharyngeal exudate.  The throat is red posteriorly. There is nasal congestion bilaterally right greater than left. There is right frontal and right maxillary and ethmoid sinus tenderness.  Eyes: Conjunctivae and EOM are normal. Pupils are equal, round, and reactive to light. Right eye exhibits no discharge. Left eye exhibits no discharge. No scleral icterus.  Neck: Normal range of motion. Neck supple. No thyromegaly present.  Cardiovascular: Normal rate, regular rhythm and normal heart sounds.   No murmur heard. Pulmonary/Chest: Effort normal and breath sounds normal. No respiratory distress. She has no wheezes. She has no rales.  Dry irritative cough  Abdominal: She exhibits no mass.  Musculoskeletal: Normal range of motion.  Lymphadenopathy:    She has no cervical adenopathy.  Neurological: She is alert and oriented to person, place, and time. No cranial nerve deficit.  Skin: Skin is warm and dry. No rash noted.  Psychiatric: She has a normal mood and affect. Her behavior is normal. Judgment and thought content normal.  malaise  Nursing note and vitals reviewed.  BP 132/80 mmHg  Pulse 82  Temp(Src) 99.2 F (37.3 C) (Oral)  Ht  5\' 8"  (1.727 m)  Wt 187 lb 9.6 oz (85.095 kg)  BMI 28.53 kg/m2  The CBC was within normal limits and the patient was informed of the results before she left the office.      Assessment & Plan:  1. Acute rhinosinusitis - POCT CBC - cefdinir (OMNICEF) 300 MG capsule; Take 1 capsule (300 mg total) by mouth 2 (two) times daily.  Dispense: 20 capsule; Refill: 0  2. Bronchitis with bronchospasm - POCT CBC - cefdinir (OMNICEF) 300 MG capsule; Take 1 capsule (300 mg total) by mouth 2 (two) times daily.  Dispense: 20 capsule; Refill: 0  3. Cough - chlorpheniramine-HYDROcodone (TUSSIONEX PENNKINETIC ER) 10-8 MG/5ML LQCR; Take 5 mLs by mouth at bedtime as needed for cough.  Dispense: 120 mL; Refill: 0 - POCT CBC  Patient Instructions  Take Mucinex maximum strength, blue and white in color, 1 twice daily with a large glass of water Use saline nose spray frequently Drink plenty of fluids Take stronger cough medicine at nighttime as needed for severe cough   Arrie Senate MD

## 2014-12-30 ENCOUNTER — Other Ambulatory Visit: Payer: Self-pay | Admitting: General Practice

## 2015-01-06 ENCOUNTER — Ambulatory Visit: Payer: BC Managed Care – PPO | Admitting: Family Medicine

## 2015-01-18 ENCOUNTER — Encounter: Payer: Self-pay | Admitting: Cardiovascular Disease

## 2015-01-18 ENCOUNTER — Other Ambulatory Visit (INDEPENDENT_AMBULATORY_CARE_PROVIDER_SITE_OTHER): Payer: BC Managed Care – PPO

## 2015-01-18 DIAGNOSIS — R300 Dysuria: Secondary | ICD-10-CM

## 2015-01-18 LAB — POCT UA - MICROSCOPIC ONLY
Bacteria, U Microscopic: NEGATIVE
CASTS, UR, LPF, POC: NEGATIVE
Crystals, Ur, HPF, POC: NEGATIVE
Mucus, UA: NEGATIVE
Yeast, UA: NEGATIVE

## 2015-01-18 LAB — POCT URINALYSIS DIPSTICK
BILIRUBIN UA: NEGATIVE
Glucose, UA: NEGATIVE
Ketones, UA: NEGATIVE
Nitrite, UA: NEGATIVE
Protein, UA: NEGATIVE
Spec Grav, UA: 1.01
UROBILINOGEN UA: NEGATIVE
pH, UA: 5

## 2015-01-18 NOTE — Progress Notes (Signed)
Lab only 

## 2015-01-20 LAB — URINE CULTURE

## 2015-01-31 ENCOUNTER — Ambulatory Visit: Payer: BC Managed Care – PPO | Admitting: Family Medicine

## 2015-02-01 ENCOUNTER — Encounter: Payer: Self-pay | Admitting: Family Medicine

## 2015-02-01 ENCOUNTER — Ambulatory Visit (INDEPENDENT_AMBULATORY_CARE_PROVIDER_SITE_OTHER): Payer: BC Managed Care – PPO | Admitting: Family Medicine

## 2015-02-01 VITALS — BP 134/77 | HR 66 | Temp 98.2°F | Ht 68.0 in | Wt 189.0 lb

## 2015-02-01 DIAGNOSIS — M6588 Other synovitis and tenosynovitis, other site: Secondary | ICD-10-CM

## 2015-02-01 DIAGNOSIS — E559 Vitamin D deficiency, unspecified: Secondary | ICD-10-CM

## 2015-02-01 DIAGNOSIS — M779 Enthesopathy, unspecified: Secondary | ICD-10-CM

## 2015-02-01 DIAGNOSIS — I10 Essential (primary) hypertension: Secondary | ICD-10-CM

## 2015-02-01 DIAGNOSIS — I25119 Atherosclerotic heart disease of native coronary artery with unspecified angina pectoris: Secondary | ICD-10-CM

## 2015-02-01 DIAGNOSIS — L570 Actinic keratosis: Secondary | ICD-10-CM

## 2015-02-01 DIAGNOSIS — M778 Other enthesopathies, not elsewhere classified: Secondary | ICD-10-CM

## 2015-02-01 DIAGNOSIS — E785 Hyperlipidemia, unspecified: Secondary | ICD-10-CM

## 2015-02-01 DIAGNOSIS — K219 Gastro-esophageal reflux disease without esophagitis: Secondary | ICD-10-CM

## 2015-02-01 DIAGNOSIS — L209 Atopic dermatitis, unspecified: Secondary | ICD-10-CM

## 2015-02-01 MED ORDER — MELOXICAM 15 MG PO TABS: 15.0000 mg | ORAL_TABLET | Freq: Every day | ORAL | Status: DC

## 2015-02-01 NOTE — Progress Notes (Signed)
Subjective:    Patient ID: Shelly Bennett, female    DOB: [birth year], 64 y.o.   MRN: [id number]  HPI Pt here for follow up and management of chronic medical problems which includes hypertension and hyperlipidemia. She is taking medications regularly. The patient today complains of some right hand and thumb pain and some redness of the left side of her trunk with itching. She also has a small area on her forehead near the hairline that she is concerned about. Her blood pressures have been good at home. She's been taking care of of a son who had a severe accident and has probably not been on a diet as she should've been over the past several weeks. She is due to get her Prevnar vaccine but she will check with insurance about this first. She is due to return an FOBT.         Patient Active Problem List   Diagnosis Date Noted  . Vitamin D deficiency 08/19/2014  . Family history of colon cancer-father at 50+ grandparent w/ rectal cancer 01/14/2014  . Osteopenia 08/12/2013  . CAD (coronary artery disease), native coronary artery 03/22/2013  . Aortic valve stenosis, mild 03/22/2013  . Hyperlipemia 03/11/2013  . Hypertension 03/11/2013  . Allergic rhinitis 03/11/2013  . Left shoulder pain 03/11/2013  . IBS (irritable bowel syndrome) - with chronic recurrent abdominal pain 02/01/2012  . Personal history of colonic polyps - adenoma 02/01/2012  . Coccydynia 02/01/2012   Outpatient Encounter Prescriptions as of 02/01/2015  Medication Sig  . amLODipine (NORVASC) 10 MG tablet TAKE ONE TABLET BY MOUTH ONE  TIME DAILY  . aspirin 81 MG tablet Take 81 mg by mouth daily.   . calcium carbonate 200 MG capsule Take 250 mg by mouth 2 (two) times daily with a meal.  . Cholecalciferol (VITAMIN D-3) 5000 UNITS TABS Take 1 capsule by mouth daily. Mon thru Friday  . desonide (DESOWEN) 0.05 % cream   . fexofenadine (ALLEGRA) 180 MG tablet Take 180 mg by mouth daily.  Marland Kitchen guaiFENesin (MUCINEX) 600 MG 12 hr  tablet Take 1,200 mg by mouth. As needed  . hydrochlorothiazide (HYDRODIURIL) 25 MG tablet TAKE ONE TABLET BY MOUTH ONE  TIME DAILY  . NASONEX 50 MCG/ACT nasal spray USE TWO SPRAYS EACH NOSTRIL ONCE DAILY.  Marland Kitchen omega-3 acid ethyl esters (LOVAZA) 1 G capsule TAKE ONE CAPSULE BY MOUTH FOUR TIMES DAILY  . omeprazole (PRILOSEC) 20 MG capsule Take 20 mg by mouth daily. One by mouth Mon Wed Friday  . rosuvastatin (CRESTOR) 40 MG tablet Take 1 tablet (40 mg total) by mouth daily. As directed  . valACYclovir (VALTREX) 1000 MG tablet Take 1 tablet by mouth daily.  Marland Kitchen LOTEMAX 0.5 % ophthalmic suspension   . [DISCONTINUED] cefdinir (OMNICEF) 300 MG capsule Take 1 capsule (300 mg total) by mouth 2 (two) times daily.  . [DISCONTINUED] chlorpheniramine-HYDROcodone (TUSSIONEX PENNKINETIC ER) 10-8 MG/5ML LQCR Take 5 mLs by mouth at bedtime as needed for cough.    Review of Systems  Constitutional: Negative.   HENT: Negative.   Eyes: Negative.   Respiratory: Negative.   Cardiovascular: Negative.   Gastrointestinal: Negative.   Endocrine: Negative.   Genitourinary: Negative.   Musculoskeletal: Positive for arthralgias (right hand and thumb pain).  Skin: Positive for color change (redness of forehead).       Left side skin itching  Allergic/Immunologic: Negative.   Neurological: Negative.   Hematological: Negative.   Psychiatric/Behavioral: Negative.  Objective:   Physical Exam  Constitutional: She is oriented to person, place, and time. She appears well-developed and well-nourished. No distress.  HENT:  Head: Normocephalic and atraumatic.  Right Ear: External ear normal.  Left Ear: External ear normal.  Nose: Nose normal.  Mouth/Throat: Oropharynx is clear and moist.  There is a small raised lesion at the hairline on the forehead on the right side which appears to be an actinic keratosis  Eyes: Conjunctivae and EOM are normal. Pupils are equal, round, and reactive to light. Right eye  exhibits no discharge. Left eye exhibits no discharge. No scleral icterus.  Neck: Normal range of motion. Neck supple. No thyromegaly present.  Cardiovascular: Normal rate, regular rhythm and intact distal pulses.  Exam reveals no gallop and no friction rub.   Murmur heard. At 60/m with a grade 2/6 systolic ejection murmur  Pulmonary/Chest: Effort normal and breath sounds normal. No respiratory distress. She has no wheezes. She has no rales. She exhibits no tenderness.  Clear anteriorly and posteriorly  Abdominal: Soft. Bowel sounds are normal. She exhibits no mass. There is no tenderness. There is no rebound and no guarding.  Musculoskeletal: Normal range of motion. She exhibits no edema or tenderness.  Lymphadenopathy:    She has no cervical adenopathy.  Neurological: She is alert and oriented to person, place, and time. She has normal reflexes. No cranial nerve deficit.  Skin: Skin is warm and dry. Rash noted. No erythema. No pallor.  Also atopic dermatitis left side of abdomen pruritic and raised areas of skin Actinic keratosis for head right side at hairline  Psychiatric: She has a normal mood and affect. Her behavior is normal. Judgment and thought content normal.  Nursing note and vitals reviewed.         Assessment & Plan:  1. Hyperlipidemia -Try to do better with diet habits and exercise, treatment will be determined by upcoming lab work - POCT CBC; Future - NMR, lipoprofile; Future  2. Hypertension -Try to do better with sodium intake in diet and for now continue current treatment - POCT CBC; Future - BMP8+EGFR; Future - Hepatic function panel; Future  3. Vitamin D deficiency -Continue current treatment - POCT CBC; Future - Vit D  25 hydroxy (rtn osteoporosis monitoring); Future  4. Coronary artery disease involving native coronary artery of native heart with angina pectoris -Continue yearly follow-up with Dr. Burt Knack the cardiologist - POCT CBC; Future - BMP8+EGFR;  Future - NMR, lipoprofile; Future - Hepatic function panel; Future  5. Gastroesophageal reflux disease, esophagitis presence not specified -Continue omeprazole daily as suppressive taking meloxicam - POCT CBC; Future  6. Tendinitis of right hand -Take meloxicam as directed one daily after breakfast for a couple of days then one half daily for a couple of weeks along with warm wet compresses  7. Actinic keratosis -The patient tolerated cryotherapy to her for head lesion without problems  8. Atopic dermatitis -Use cortisone 10 and use only scent free fabric softener, detergent, and soap  Meds ordered this encounter  Medications  . meloxicam (MOBIC) 15 MG tablet    Sig: Take 1 tablet (15 mg total) by mouth daily.    Dispense:  30 tablet    Refill:  0    As directed   Patient Instructions                       Medicare Annual Wellness Visit  [place] and the medical providers at [facility] strive to bring you the best medical care.  In doing so we not only want to address your current medical conditions and concerns but also to detect new conditions early and prevent illness, disease and health-related problems.    Medicare offers a yearly Wellness Visit which allows our clinical staff to assess your need for preventative services including immunizations, lifestyle education, counseling to decrease risk of preventable diseases and screening for fall risk and other medical concerns.    This visit is provided free of charge (no copay) for all Medicare recipients. The clinical pharmacists at [facility] have begun to conduct these Wellness Visits which will also include a thorough review of all your medications.    As you primary medical provider recommend that you make an appointment for your Annual Wellness Visit if you have not done so already this year.  You may set up this appointment before you leave today or you may call back  ([phone]) and schedule an appointment.  Please make sure when you call that you mention that you are scheduling your Annual Wellness Visit with the clinical pharmacist so that the appointment may be made for the proper length of time.     Continue current medications. Continue good therapeutic lifestyle changes which include good diet and exercise. Fall precautions discussed with patient. If an FOBT was given today- please return it to our front desk. If you are over 38 years old - you may need Prevnar 66 or the adult Pneumonia vaccine.  Flu Shots are still available at our office. If you still haven't had one please call to set up a nurse visit to get one.   After your visit with [place] today you will receive a survey in the mail or online from [employer] regarding your care with [place]. Please take a moment to fill this out. Your feedback is very important to [place] as you can help [place] better understand your patient needs as well as improve your experience and satisfaction. WE CARE ABOUT YOU!!!    Use cortisone 10 to left side area lightly twice daily for 7-10 days for atopic dermatitis Start Mobic 15 - sent to [facility] one daily after breakfast for a couple days then decrease it to one half, if it bothers her stomach discontinue it--- this medicine is for tendinitis If the tendinitis in the thumb continues you may need to get an injection from Dr. Durward Fortes. Please return the FOBT and also return to the office for fasting lab work. Please check with your insurance regarding the Prevnar vaccine   Arrie Senate MD

## 2015-02-01 NOTE — Patient Instructions (Addendum)
Medicare Annual Wellness Visit  [place] and the medical providers at [facility] strive to bring you the best medical care.  In doing so we not only want to address your current medical conditions and concerns but also to detect new conditions early and prevent illness, disease and health-related problems.    Medicare offers a yearly Wellness Visit which allows our clinical staff to assess your need for preventative services including immunizations, lifestyle education, counseling to decrease risk of preventable diseases and screening for fall risk and other medical concerns.    This visit is provided free of charge (no copay) for all Medicare recipients. The clinical pharmacists at [facility] have begun to conduct these Wellness Visits which will also include a thorough review of all your medications.    As you primary medical provider recommend that you make an appointment for your Annual Wellness Visit if you have not done so already this year.  You may set up this appointment before you leave today or you may call back ([phone]) and schedule an appointment.  Please make sure when you call that you mention that you are scheduling your Annual Wellness Visit with the clinical pharmacist so that the appointment may be made for the proper length of time.     Continue current medications. Continue good therapeutic lifestyle changes which include good diet and exercise. Fall precautions discussed with patient. If an FOBT was given today- please return it to our front desk. If you are over 52 years old - you may need Prevnar 31 or the adult Pneumonia vaccine.  Flu Shots are still available at our office. If you still haven't had one please call to set up a nurse visit to get one.   After your visit with [place] today you will receive a survey in the mail or online from [employer] regarding your care with [place]. Please take a moment to  fill this out. Your feedback is very important to [place] as you can help [place] better understand your patient needs as well as improve your experience and satisfaction. WE CARE ABOUT YOU!!!    Use cortisone 10 to left side area lightly twice daily for 7-10 days for atopic dermatitis Start Mobic 15 - sent to [facility] one daily after breakfast for a couple days then decrease it to one half, if it bothers her stomach discontinue it--- this medicine is for tendinitis If the tendinitis in the thumb continues you may need to get an injection from Dr. Durward Fortes. Please return the FOBT and also return to the office for fasting lab work. Please check with your insurance regarding the Prevnar vaccine

## 2015-02-07 ENCOUNTER — Encounter (HOSPITAL_BASED_OUTPATIENT_CLINIC_OR_DEPARTMENT_OTHER): Payer: Self-pay | Admitting: *Deleted

## 2015-02-07 ENCOUNTER — Emergency Department (HOSPITAL_BASED_OUTPATIENT_CLINIC_OR_DEPARTMENT_OTHER): Payer: BC Managed Care – PPO

## 2015-02-07 ENCOUNTER — Emergency Department (HOSPITAL_BASED_OUTPATIENT_CLINIC_OR_DEPARTMENT_OTHER)
Admission: EM | Admit: 2015-02-07 | Discharge: 2015-02-08 | Disposition: A | Discharge status: Home / Self Care | Payer: BC Managed Care – PPO | Attending: Emergency Medicine | Admitting: Emergency Medicine

## 2015-02-07 DIAGNOSIS — Y9389 Activity, other specified: Secondary | ICD-10-CM | POA: Insufficient documentation

## 2015-02-07 DIAGNOSIS — Z880 Allergy status to penicillin: Secondary | ICD-10-CM | POA: Diagnosis not present

## 2015-02-07 DIAGNOSIS — Z8742 Personal history of other diseases of the female genital tract: Secondary | ICD-10-CM | POA: Insufficient documentation

## 2015-02-07 DIAGNOSIS — Z79899 Other long term (current) drug therapy: Secondary | ICD-10-CM | POA: Diagnosis not present

## 2015-02-07 DIAGNOSIS — W19XXXA Unspecified fall, initial encounter: Secondary | ICD-10-CM

## 2015-02-07 DIAGNOSIS — S99922A Unspecified injury of left foot, initial encounter: Secondary | ICD-10-CM | POA: Diagnosis present

## 2015-02-07 DIAGNOSIS — Z7982 Long term (current) use of aspirin: Secondary | ICD-10-CM | POA: Insufficient documentation

## 2015-02-07 DIAGNOSIS — I10 Essential (primary) hypertension: Secondary | ICD-10-CM | POA: Insufficient documentation

## 2015-02-07 DIAGNOSIS — M1990 Unspecified osteoarthritis, unspecified site: Secondary | ICD-10-CM | POA: Insufficient documentation

## 2015-02-07 DIAGNOSIS — S92502A Displaced unspecified fracture of left lesser toe(s), initial encounter for closed fracture: Secondary | ICD-10-CM | POA: Diagnosis not present

## 2015-02-07 DIAGNOSIS — W108XXA Fall (on) (from) other stairs and steps, initial encounter: Secondary | ICD-10-CM | POA: Insufficient documentation

## 2015-02-07 DIAGNOSIS — E669 Obesity, unspecified: Secondary | ICD-10-CM | POA: Insufficient documentation

## 2015-02-07 DIAGNOSIS — Z7951 Long term (current) use of inhaled steroids: Secondary | ICD-10-CM | POA: Insufficient documentation

## 2015-02-07 DIAGNOSIS — Z8719 Personal history of other diseases of the digestive system: Secondary | ICD-10-CM | POA: Insufficient documentation

## 2015-02-07 DIAGNOSIS — Y998 Other external cause status: Secondary | ICD-10-CM | POA: Insufficient documentation

## 2015-02-07 DIAGNOSIS — I2510 Atherosclerotic heart disease of native coronary artery without angina pectoris: Secondary | ICD-10-CM | POA: Diagnosis not present

## 2015-02-07 DIAGNOSIS — Y9289 Other specified places as the place of occurrence of the external cause: Secondary | ICD-10-CM | POA: Insufficient documentation

## 2015-02-07 DIAGNOSIS — E785 Hyperlipidemia, unspecified: Secondary | ICD-10-CM | POA: Insufficient documentation

## 2015-02-07 DIAGNOSIS — Z86010 Personal history of colonic polyps: Secondary | ICD-10-CM | POA: Diagnosis not present

## 2015-02-07 DIAGNOSIS — S99911A Unspecified injury of right ankle, initial encounter: Secondary | ICD-10-CM | POA: Diagnosis not present

## 2015-02-07 DIAGNOSIS — Z791 Long term (current) use of non-steroidal anti-inflammatories (NSAID): Secondary | ICD-10-CM | POA: Insufficient documentation

## 2015-02-07 DIAGNOSIS — S92912A Unspecified fracture of left toe(s), initial encounter for closed fracture: Secondary | ICD-10-CM

## 2015-02-07 DIAGNOSIS — S8992XA Unspecified injury of left lower leg, initial encounter: Secondary | ICD-10-CM | POA: Insufficient documentation

## 2015-02-07 DIAGNOSIS — M79672 Pain in left foot: Secondary | ICD-10-CM

## 2015-02-07 DIAGNOSIS — M25562 Pain in left knee: Secondary | ICD-10-CM

## 2015-02-07 DIAGNOSIS — S99912A Unspecified injury of left ankle, initial encounter: Secondary | ICD-10-CM | POA: Insufficient documentation

## 2015-02-07 MED ORDER — OXYCODONE-ACETAMINOPHEN 5-325 MG PO TABS
1.0000 | ORAL_TABLET | Freq: Once | ORAL | Status: AC
  Administered 2015-02-07: 1 via ORAL
  Filled 2015-02-07: qty 1

## 2015-02-07 NOTE — ED Notes (Signed)
Pt c/o fall x 1 hr ago with left leg pain

## 2015-02-07 NOTE — ED Provider Notes (Signed)
CSN: [id number]     Arrival date & time 02/07/15  2023 History   First MD Initiated Contact with Patient 02/07/15 2241     Chief Complaint  Patient presents with  . Fall   Shelly Bennett is a 64 y.o. female with a history of arthritis, hypertension, coronary artery disease and irritable bowel syndrome who presents the emergency department complaining of left knee, left leg, left ankle and left foot pain after fall just prior to arrival. Patient reports she slipped and fell down 4 stairs landing on her left leg and foot. Patient rates her pain at 8 out of 10 and worse with movement. Patient has taken nothing for treatment today. She does report some numbness in the ball of her foot. She denies previous injuries to her left knee. Patient denies head injury or loss of consciousness. Patient denies recent illness, fevers, dizziness, lightheadedness, loss of consciousness, weakness, abdominal pain, nausea or vomiting.    (Consider location/radiation/quality/duration/timing/severity/associated sxs/prior Treatment) HPI  Past Medical History  Diagnosis Date  . Arthritis   . Chronic headaches   . Adenomatous colon polyp   . CAD (coronary artery disease)   . HLD (hyperlipidemia)   . HTN (hypertension)   . Diverticulosis   . IBS (irritable bowel syndrome)   . Obesity   . External hemorrhoids   . Cystocele    Past Surgical History  Procedure Laterality Date  . Knee surgery      right  . Carpal tunnel release    . Colonoscopy  12/15/2008    diverticulosis, external hemorrhoids  . Laproscopy     Family History  Problem Relation Age of Onset  . Prostate cancer Father   . Colon cancer Father 56  . Kidney disease Father   . Hyperlipidemia Mother   . Hypertension Mother   . Kidney disease Mother   . Osteoporosis Mother   . Hyperlipidemia Sister   . Hypertension Sister   . Heart disease Brother    History  Substance Use Topics  . Smoking status: Never Smoker   . Smokeless tobacco:  Never Used  . Alcohol Use: No   OB History    No data available     Review of Systems  Constitutional: Negative for fever and chills.  HENT: Negative for congestion and sore throat.   Eyes: Negative for visual disturbance.  Respiratory: Negative for cough, shortness of breath and wheezing.   Cardiovascular: Negative for chest pain and palpitations.  Gastrointestinal: Negative for nausea, vomiting, abdominal pain and diarrhea.  Genitourinary: Negative for dysuria and difficulty urinating.  Musculoskeletal: Positive for arthralgias. Negative for back pain, neck pain and neck stiffness.       Left knee, leg, ankle and foot pain.   Skin: Negative for rash.  Neurological: Positive for numbness. Negative for dizziness, weakness, light-headedness and headaches.      Allergies  Penicillins; Ace inhibitors; Angiotensin receptor blockers; Vytorin; and Zetia  Home Medications   Prior to Admission medications   Medication Sig Start Date End Date Taking? Authorizing Provider  amLODipine (NORVASC) 10 MG tablet TAKE ONE TABLET BY MOUTH ONE  TIME DAILY 12/31/14   Chipper Herb, MD  aspirin 81 MG tablet Take 81 mg by mouth daily.     Historical Provider, MD  calcium carbonate 200 MG capsule Take 250 mg by mouth 2 (two) times daily with a meal.    Historical Provider, MD  Cholecalciferol (VITAMIN D-3) 5000 UNITS TABS Take 1 capsule by mouth daily.  Mon thru Friday    Historical Provider, MD  desonide (DESOWEN) 0.05 % cream  06/05/13   Historical Provider, MD  fexofenadine (ALLEGRA) 180 MG tablet Take 180 mg by mouth daily.    Historical Provider, MD  guaiFENesin (MUCINEX) 600 MG 12 hr tablet Take 1,200 mg by mouth. As needed    Historical Provider, MD  hydrochlorothiazide (HYDRODIURIL) 25 MG tablet TAKE ONE TABLET BY MOUTH ONE  TIME DAILY 12/31/14   Chipper Herb, MD  LOTEMAX 0.5 % ophthalmic suspension  05/17/14   Historical Provider, MD  meloxicam (MOBIC) 15 MG tablet Take 1 tablet (15 mg total)  by mouth daily. 02/01/15   Chipper Herb, MD  NASONEX 50 MCG/ACT nasal spray USE TWO SPRAYS EACH NOSTRIL ONCE DAILY.    Chipper Herb, MD  omega-3 acid ethyl esters (LOVAZA) 1 G capsule TAKE ONE CAPSULE BY MOUTH FOUR TIMES DAILY 09/20/14   Chipper Herb, MD  omeprazole (PRILOSEC) 20 MG capsule Take 20 mg by mouth daily. One by mouth Mon Wed Friday    Historical Provider, MD  oxyCODONE-acetaminophen (PERCOCET/ROXICET) 5-325 MG per tablet Take 1-2 tablets by mouth every 4 (four) hours as needed for moderate pain or severe pain. 02/08/15   Verda Cumins Eliseo Withers, PA-C  rosuvastatin (CRESTOR) 40 MG tablet Take 1 tablet (40 mg total) by mouth daily. As directed 04/14/14   Chipper Herb, MD  valACYclovir (VALTREX) 1000 MG tablet Take 1 tablet by mouth daily. 08/16/14   Historical Provider, MD   BP 177/72 mmHg  Pulse 73  Temp(Src) 97.7 F (36.5 C) (Oral)  Resp 18  Ht 5\' 8"  (1.727 m)  Wt 180 lb (81.647 kg)  BMI 27.38 kg/m2  SpO2 100% Physical Exam  Constitutional: She is oriented to person, place, and time. She appears well-developed and well-nourished. No distress.  HENT:  Head: Normocephalic and atraumatic.  Right Ear: External ear normal.  Left Ear: External ear normal.  Nose: Nose normal.  Mouth/Throat: Oropharynx is clear and moist. No oropharyngeal exudate.  Eyes: Conjunctivae and EOM are normal. Pupils are equal, round, and reactive to light. Right eye exhibits no discharge. Left eye exhibits no discharge.  Neck: Normal range of motion. Neck supple.  Cardiovascular: Normal rate, regular rhythm, normal heart sounds and intact distal pulses.  Exam reveals no gallop and no friction rub.   No murmur heard. Bilateral radial, posterior tibialis and dorsalis pedis pulses are intact.   Pulmonary/Chest: Effort normal and breath sounds normal. No respiratory distress. She has no wheezes. She has no rales. She exhibits no tenderness.  Abdominal: Soft. Bowel sounds are normal. There is no tenderness.   Musculoskeletal: She exhibits tenderness. She exhibits no edema.  Tenderness to her left posterior knee, dorsum of her left foot, bilateral ankle, and left lateral leg. She has full ROM of her left knee and ankle, but with pain. No obvious deformity, edema, erythema or wounds. No pelvic instability. Full ROM of her left hip without pain.   Lymphadenopathy:    She has no cervical adenopathy.  Neurological: She is alert and oriented to person, place, and time. Coordination normal.  Sensation is intact in her bilateral lower extremities.   Skin: Skin is warm and dry. No rash noted. She is not diaphoretic. No erythema. No pallor.  Psychiatric: She has a normal mood and affect. Her behavior is normal.  Nursing note and vitals reviewed.   ED Course  Procedures (including critical care time) Labs Review Labs Reviewed -  No data to display  Imaging Review Dg Tibia/fibula Left  02/07/2015   CLINICAL DATA:  Slipped and fell down 4 stairs tonight injuring to the left lower leg. Tightness in the back of the leg with pain radiating to the foot.  EXAM: LEFT TIBIA AND FIBULA - 2 VIEW  COMPARISON:  Left knee 04/14/2014  FINDINGS: Mild degenerative changes in the left knee and left ankle. Tibia and fibula appear intact. No evidence of acute fracture or dislocation. Achilles and plantar calcaneal spurs. Prominent posterior process of the talus. Vascular calcifications likely venous.  IMPRESSION: No acute bony abnormalities.   Electronically Signed   By: Lucienne Capers M.D.   On: 02/07/2015 21:10   Dg Ankle Complete Left  02/07/2015   CLINICAL DATA:  Left foot and ankle pain after slip and fall down steps earlier this day.  EXAM: LEFT ANKLE COMPLETE - 3+ VIEW  COMPARISON:  None.  FINDINGS: No fracture or dislocation. The alignment and joint spaces are maintained. The ankle mortise is preserved. Small well corticated density distal to the fibular tip likely sequela of remote prior injury or accessory ossicle.  Small Achilles tendon enthesophyte and prominent plantar calcaneal spur.  IMPRESSION: No fracture or dislocation of the left ankle.   Electronically Signed   By: Jeb Levering M.D.   On: 02/07/2015 23:50   Dg Knee Complete 4 Views Left  02/07/2015   CLINICAL DATA:  Slip and fall down 4 steps today at church, LEFT knee injury. History of arthritis.  EXAM: LEFT KNEE - COMPLETE 4+ VIEW  COMPARISON:  LEFT knee radiograph April 14, 2014.  FINDINGS: No acute fracture deformity or dislocation. Joint space intact without erosions. No destructive bony lesions. Soft tissue planes are not suspicious. Pretibial phleboliths.  IMPRESSION: No acute fracture deformity, dislocation or advanced degenerative change for age.   Electronically Signed   By: Elon Alas   On: 02/07/2015 23:49   Dg Foot Complete Left  02/07/2015   CLINICAL DATA:  Left foot and ankle pain after slip and fall down stairs earlier this day.  EXAM: LEFT FOOT - COMPLETE 3+ VIEW  COMPARISON:  None.  FINDINGS: Small osseous density is seen in the medial aspect of the second toe proximal phalanx of the metatarsal phalangeal joint. This may reflect an avulsion injury, however acuity is uncertain. There is no associated soft tissue edema. Similar density seen at the base of the third proximal phalanx. There is no additional fracture. Proliferative change noted in the midfoot. There is a plantar calcaneal spur and small callus tendon enthesophyte. Fluid alignment is maintained.  IMPRESSION: 1. Small osseous densities base of the second and third proximal phalanges at the metatarsal phalangeal joint. This may reflect avulsion injuries, however acuity is uncertain. There is no associated soft tissue edema, suspect this is chronic. Correlation with point tenderness is recommended. 2. There is no additional acute fracture.   Electronically Signed   By: Jeb Levering M.D.   On: 02/07/2015 23:53     EKG Interpretation None      Filed Vitals:    02/07/15 2027 02/08/15 0132  BP: 177/72   Pulse: 79 73  Temp: 98.1 F (36.7 C) 97.7 F (36.5 C)  TempSrc: Oral Oral  Resp: 16 18  Height: 5\' 8"  (1.727 m)   Weight: 180 lb (81.647 kg)   SpO2: 98% 100%     MDM   Meds given in ED:  Medications  oxyCODONE-acetaminophen (PERCOCET/ROXICET) 5-325 MG per tablet 1 tablet (  1 tablet Oral Given 02/07/15 2351)    Discharge Medication List as of 02/08/2015 12:57 AM    START taking these medications   Details  oxyCODONE-acetaminophen (PERCOCET/ROXICET) 5-325 MG per tablet Take 1-2 tablets by mouth every 4 (four) hours as needed for moderate pain or severe pain., Starting 02/08/2015, Until Discontinued, Print        Final diagnoses:  Fall, initial encounter  Left knee pain  Left foot pain  Closed fracture multiple phalanges, toe, left, initial encounter   This is a 64 y.o. female who presents the emergency department complaining of left knee, left leg, left ankle and left foot pain after fall just prior to arrival. Patient reports she slipped and fell down 4 stairs landing on her left leg and foot. She denies hitting her head or loss of consciousness. The patient has no obvious deformity or injury. She does have tenderness to her posterior knee and left ankle and dorsum of her left foot. She is neurovascularly intact. X-rays of her left knee, left tib-fib, and left ankle are unremarkable. Her left foot x-ray shows small osseous densities at the base of the second and third proximal phalanges of the metatarsal phalangeal joint. They may reflect avulsion injuries and acuity is uncertain on x-ray. There is no associated soft tissue edema and this could be chronic. Patient does have pain over the dorsum of her foot and is hard to localize where her pain is located. Will place in post op boot and have her follow up with her orthopedic surgeon Dr. Durward Fortes. She is not able to use crutches due to tendonitis in her hand, but she has a wheelchair at home  and her husband reports he can help her with this until follow up with Dr. Durward Fortes this week. Narcotic pain medications precautions provided. I advised the patient to follow-up with their primary care provider this week. I advised the patient to return to the emergency department with new or worsening symptoms or new concerns. The patient verbalized understanding and agreement with plan.   This patient was discussed with Dr. Kathrynn Humble who agrees with assessment and plan.     Hanley Hays, PA-C 02/08/15 0157  Varney Biles, MD 02/08/15 [phone]

## 2015-02-08 DIAGNOSIS — S92502A Displaced unspecified fracture of left lesser toe(s), initial encounter for closed fracture: Secondary | ICD-10-CM | POA: Diagnosis not present

## 2015-02-08 MED ORDER — OXYCODONE-ACETAMINOPHEN 5-325 MG PO TABS: 1.0000 | ORAL_TABLET | ORAL | Status: DC | PRN

## 2015-02-08 NOTE — Discharge Instructions (Signed)
You had x-rays of your left knee, leg, foot and ankle. Your left foot xray showed avulsion fractures at the base of your 2nd and 3rd proximal phalanges (near your toes). Please follow up with your orthopedic surgeon this week.  Fall Prevention and Home Safety Falls cause injuries and can affect all age groups. It is possible to use preventive measures to significantly decrease the likelihood of falls. There are many simple measures which can make your home safer and prevent falls. OUTDOORS  Repair cracks and edges of walkways and driveways.  Remove high doorway thresholds.  Trim shrubbery on the main path into your home.  Have good outside lighting.  Clear walkways of tools, rocks, debris, and clutter.  Check that handrails are not broken and are securely fastened. Both sides of steps should have handrails.  Have leaves, snow, and ice cleared regularly.  Use sand or salt on walkways during winter months.  In the garage, clean up grease or oil spills. BATHROOM  Install night lights.  Install grab bars by the toilet and in the tub and shower.  Use non-skid mats or decals in the tub or shower.  Place a plastic non-slip stool in the shower to sit on, if needed.  Keep floors dry and clean up all water on the floor immediately.  Remove soap buildup in the tub or shower on a regular basis.  Secure bath mats with non-slip, double-sided rug tape.  Remove throw rugs and tripping hazards from the floors. BEDROOMS  Install night lights.  Make sure a bedside light is easy to reach.  Do not use oversized bedding.  Keep a telephone by your bedside.  Have a firm chair with side arms to use for getting dressed.  Remove throw rugs and tripping hazards from the floor. KITCHEN  Keep handles on pots and pans turned toward the center of the stove. Use back burners when possible.  Clean up spills quickly and allow time for drying.  Avoid walking on wet floors.  Avoid hot  utensils and knives.  Position shelves so they are not too high or low.  Place commonly used objects within easy reach.  If necessary, use a sturdy step stool with a grab bar when reaching.  Keep electrical cables out of the way.  Do not use floor polish or wax that makes floors slippery. If you must use wax, use non-skid floor wax.  Remove throw rugs and tripping hazards from the floor. STAIRWAYS  Never leave objects on stairs.  Place handrails on both sides of stairways and use them. Fix any loose handrails. Make sure handrails on both sides of the stairways are as long as the stairs.  Check carpeting to make sure it is firmly attached along stairs. Make repairs to worn or loose carpet promptly.  Avoid placing throw rugs at the top or bottom of stairways, or properly secure the rug with carpet tape to prevent slippage. Get rid of throw rugs, if possible.  Have an electrician put in a light switch at the top and bottom of the stairs. OTHER FALL PREVENTION TIPS  Wear low-heel or rubber-soled shoes that are supportive and fit well. Wear closed toe shoes.  When using a stepladder, make sure it is fully opened and both spreaders are firmly locked. Do not climb a closed stepladder.  Add color or contrast paint or tape to grab bars and handrails in your home. Place contrasting color strips on first and last steps.  Learn and use mobility aids  as needed. Install an electrical emergency response system.  Turn on lights to avoid dark areas. Replace light bulbs that burn out immediately. Get light switches that glow.  Arrange furniture to create clear pathways. Keep furniture in the same place.  Firmly attach carpet with non-skid or double-sided tape.  Eliminate uneven floor surfaces.  Select a carpet pattern that does not visually hide the edge of steps.  Be aware of all pets. OTHER HOME SAFETY TIPS  Set the water temperature for 120 F (48.8 C).  Keep emergency numbers on  or near the telephone.  Keep smoke detectors on every level of the home and near sleeping areas. Document Released: 11/23/2002 Document Revised: 06/03/2012 Document Reviewed: 02/22/2012 [facility] Patient Information 2015 [place], [place]. This information is not intended to replace advice given to you by your health care provider. Make sure you discuss any questions you have with your health care provider. Knee Pain The knee is the complex joint between your thigh and your lower leg. It is made up of bones, tendons, ligaments, and cartilage. The bones that make up the knee are: The femur in the thigh. The tibia and fibula in the lower leg. The patella or kneecap riding in the groove on the lower femur. CAUSES  Knee pain is a common complaint with many causes. A few of these causes are: Injury, such as: A ruptured ligament or tendon injury. Torn cartilage. Medical conditions, such as: Gout Arthritis Infections Overuse, over training, or overdoing a physical activity. Knee pain can be minor or severe. Knee pain can accompany debilitating injury. Minor knee problems often respond well to self-care measures or get well on their own. More serious injuries may need medical intervention or even surgery. SYMPTOMS The knee is complex. Symptoms of knee problems can vary widely. Some of the problems are: Pain with movement and weight bearing. Swelling and tenderness. Buckling of the knee. Inability to straighten or extend your knee. Your knee locks and you cannot straighten it. Warmth and redness with pain and fever. Deformity or dislocation of the kneecap. DIAGNOSIS  Determining what is wrong may be very straight forward such as when there is an injury. It can also be challenging because of the complexity of the knee. Tests to make a diagnosis may include: Your caregiver taking a history and doing a physical exam. Routine X-rays can be used to rule out other problems. X-rays will not reveal a  cartilage tear. Some injuries of the knee can be diagnosed by: Arthroscopy a surgical technique by which a small video camera is inserted through tiny incisions on the sides of the knee. This procedure is used to examine and repair internal knee joint problems. Tiny instruments can be used during arthroscopy to repair the torn knee cartilage (meniscus). Arthrography is a radiology technique. A contrast liquid is directly injected into the knee joint. Internal structures of the knee joint then become visible on X-ray film. An MRI scan is a non X-ray radiology procedure in which magnetic fields and a computer produce two- or three-dimensional images of the inside of the knee. Cartilage tears are often visible using an MRI scanner. MRI scans have largely replaced arthrography in diagnosing cartilage tears of the knee. Blood work. Examination of the fluid that helps to lubricate the knee joint (synovial fluid). This is done by taking a sample out using a needle and a syringe. TREATMENT The treatment of knee problems depends on the cause. Some of these treatments are: Depending on the injury, proper  casting, splinting, surgery, or physical therapy care will be needed. Give yourself adequate recovery time. Do not overuse your joints. If you begin to get sore during workout routines, back off. Slow down or do fewer repetitions. For repetitive activities such as cycling or running, maintain your strength and nutrition. Alternate muscle groups. For example, if you are a weight lifter, work the upper body on one day and the lower body the next. Either tight or weak muscles do not give the proper support for your knee. Tight or weak muscles do not absorb the stress placed on the knee joint. Keep the muscles surrounding the knee strong. Take care of mechanical problems. If you have flat feet, orthotics or special shoes may help. See your caregiver if you need help. Arch supports, sometimes with wedges on the  inner or outer aspect of the heel, can help. These can shift pressure away from the side of the knee most bothered by osteoarthritis. A brace called an "unloader" brace also may be used to help ease the pressure on the most arthritic side of the knee. If your caregiver has prescribed crutches, braces, wraps or ice, use as directed. The acronym for this is PRICE. This means protection, rest, ice, compression, and elevation. Nonsteroidal anti-inflammatory drugs (NSAIDs), can help relieve pain. But if taken immediately after an injury, they may actually increase swelling. Take NSAIDs with food in your stomach. Stop them if you develop stomach problems. Do not take these if you have a history of ulcers, stomach pain, or bleeding from the bowel. Do not take without your caregiver's approval if you have problems with fluid retention, heart failure, or kidney problems. For ongoing knee problems, physical therapy may be helpful. Glucosamine and chondroitin are over-the-counter dietary supplements. Both may help relieve the pain of osteoarthritis in the knee. These medicines are different from the usual anti-inflammatory drugs. Glucosamine may decrease the rate of cartilage destruction. Injections of a corticosteroid drug into your knee joint may help reduce the symptoms of an arthritis flare-up. They may provide pain relief that lasts a few months. You may have to wait a few months between injections. The injections do have a small increased risk of infection, water retention, and elevated blood sugar levels. Hyaluronic acid injected into damaged joints may ease pain and provide lubrication. These injections may work by reducing inflammation. A series of shots may give relief for as long as 6 months. Topical painkillers. Applying certain ointments to your skin may help relieve the pain and stiffness of osteoarthritis. Ask your pharmacist for suggestions. Many over the-counter products are approved for temporary  relief of arthritis pain. In some countries, doctors often prescribe topical NSAIDs for relief of chronic conditions such as arthritis and tendinitis. A review of treatment with NSAID creams found that they worked as well as oral medications but without the serious side effects. PREVENTION Maintain a healthy weight. Extra pounds put more strain on your joints. Get strong, stay limber. Weak muscles are a common cause of knee injuries. Stretching is important. Include flexibility exercises in your workouts. Be smart about exercise. If you have osteoarthritis, chronic knee pain or recurring injuries, you may need to change the way you exercise. This does not mean you have to stop being active. If your knees ache after jogging or playing basketball, consider switching to swimming, water aerobics, or other low-impact activities, at least for a few days a week. Sometimes limiting high-impact activities will provide relief. Make sure your shoes fit well. Choose  footwear that is right for your sport. Protect your knees. Use the proper gear for knee-sensitive activities. Use kneepads when playing volleyball or laying carpet. Buckle your seat belt every time you drive. Most shattered kneecaps occur in car accidents. Rest when you are tired. SEEK MEDICAL CARE IF:  You have knee pain that is continual and does not seem to be getting better.  SEEK IMMEDIATE MEDICAL CARE IF:  Your knee joint feels hot to the touch and you have a high fever. MAKE SURE YOU:  Understand these instructions. Will watch your condition. Will get help right away if you are not doing well or get worse. Document Released: 09/30/2007 Document Revised: 02/25/2012 Document Reviewed: 09/30/2007 [facility] Patient Information 2015 [place], [place]. This information is not intended to replace advice given to you by your health care provider. Make sure you discuss any questions you have with your health care provider.  Cast or Splint Care Casts  and splints support injured limbs and keep bones from moving while they heal. It is important to care for your cast or splint at home.  HOME CARE INSTRUCTIONS  Keep the cast or splint uncovered during the drying period. It can take 24 to 48 hours to dry if it is made of plaster. A fiberglass cast will dry in less than 1 hour.  Do not rest the cast on anything harder than a pillow for the first 24 hours.  Do not put weight on your injured limb or apply pressure to the cast until your health care provider gives you permission.  Keep the cast or splint dry. Wet casts or splints can lose their shape and may not support the limb as well. A wet cast that has lost its shape can also create harmful pressure on your skin when it dries. Also, wet skin can become infected.  Cover the cast or splint with a plastic bag when bathing or when out in the rain or snow. If the cast is on the trunk of the body, take sponge baths until the cast is removed.  If your cast does become wet, dry it with a towel or a blow dryer on the cool setting only.  Keep your cast or splint clean. Soiled casts may be wiped with a moistened cloth.  Do not place any hard or soft foreign objects under your cast or splint, such as cotton, toilet paper, lotion, or powder.  Do not try to scratch the skin under the cast with any object. The object could get stuck inside the cast. Also, scratching could lead to an infection. If itching is a problem, use a blow dryer on a cool setting to relieve discomfort.  Do not trim or cut your cast or remove padding from inside of it.  Exercise all joints next to the injury that are not immobilized by the cast or splint. For example, if you have a long leg cast, exercise the hip joint and toes. If you have an arm cast or splint, exercise the shoulder, elbow, thumb, and fingers.  Elevate your injured arm or leg on 1 or 2 pillows for the first 1 to 3 days to decrease swelling and pain.It is best if  you can comfortably elevate your cast so it is higher than your heart. SEEK MEDICAL CARE IF:   Your cast or splint cracks.  Your cast or splint is too tight or too loose.  You have unbearable itching inside the cast.  Your cast becomes wet or develops a soft  spot or area.  You have a bad smell coming from inside your cast.  You get an object stuck under your cast.  Your skin around the cast becomes red or raw.  You have new pain or worsening pain after the cast has been applied. SEEK IMMEDIATE MEDICAL CARE IF:   You have fluid leaking through the cast.  You are unable to move your fingers or toes.  You have discolored (blue or white), cool, painful, or very swollen fingers or toes beyond the cast.  You have tingling or numbness around the injured area.  You have severe pain or pressure under the cast.  You have any difficulty with your breathing or have shortness of breath.  You have chest pain. Document Released: 11/30/2000 Document Revised: 09/23/2013 Document Reviewed: 06/11/2013 [facility] Patient Information 2015 [place], [place]. This information is not intended to replace advice given to you by your health care provider. Make sure you discuss any questions you have with your health care provider.

## 2015-03-04 ENCOUNTER — Other Ambulatory Visit (INDEPENDENT_AMBULATORY_CARE_PROVIDER_SITE_OTHER): Payer: BC Managed Care – PPO

## 2015-03-04 DIAGNOSIS — N390 Urinary tract infection, site not specified: Secondary | ICD-10-CM

## 2015-03-04 DIAGNOSIS — I10 Essential (primary) hypertension: Secondary | ICD-10-CM | POA: Diagnosis not present

## 2015-03-04 DIAGNOSIS — I25119 Atherosclerotic heart disease of native coronary artery with unspecified angina pectoris: Secondary | ICD-10-CM

## 2015-03-04 DIAGNOSIS — Z1212 Encounter for screening for malignant neoplasm of rectum: Secondary | ICD-10-CM

## 2015-03-04 DIAGNOSIS — E559 Vitamin D deficiency, unspecified: Secondary | ICD-10-CM

## 2015-03-04 LAB — POCT CBC
GRANULOCYTE PERCENT: 67.7 % (ref 37–80)
HEMATOCRIT: 39.4 % (ref 37.7–47.9)
HEMOGLOBIN: 12.8 g/dL (ref 12.2–16.2)
Lymph, poc: 2.8 (ref 0.6–3.4)
MCH, POC: 31 pg (ref 27–31.2)
MCHC: 32.5 g/dL (ref 31.8–35.4)
MCV: 95.5 fL (ref 80–97)
MPV: 8.5 fL (ref 0–99.8)
PLATELET COUNT, POC: 266 10*3/uL (ref 142–424)
POC GRANULOCYTE: 6.4 (ref 2–6.9)
POC LYMPH PERCENT: 29.2 %L (ref 10–50)
RBC: 4.13 M/uL (ref 4.04–5.48)
RDW, POC: 14 %
WBC: 9.5 10*3/uL (ref 4.6–10.2)

## 2015-03-04 LAB — POCT UA - MICROSCOPIC ONLY
Bacteria, U Microscopic: NEGATIVE
Casts, Ur, LPF, POC: NEGATIVE
Crystals, Ur, HPF, POC: NEGATIVE
Mucus, UA: NEGATIVE
RBC, urine, microscopic: NEGATIVE
WBC, UR, HPF, POC: NEGATIVE
YEAST UA: NEGATIVE

## 2015-03-04 LAB — POCT URINALYSIS DIPSTICK
BILIRUBIN UA: NEGATIVE
Blood, UA: NEGATIVE
GLUCOSE UA: NEGATIVE
KETONES UA: NEGATIVE
LEUKOCYTES UA: NEGATIVE
Nitrite, UA: NEGATIVE
PROTEIN UA: NEGATIVE
Urobilinogen, UA: NEGATIVE
pH, UA: 7.5

## 2015-03-04 NOTE — Progress Notes (Signed)
Lab only 

## 2015-03-04 NOTE — Progress Notes (Signed)
LAB ONLY 

## 2015-03-04 NOTE — Addendum Note (Signed)
Addended by: Earlene Plater on: 03/04/2015 04:16 PM   Modules accepted: Orders

## 2015-03-04 NOTE — Addendum Note (Signed)
Addended by: Selmer Dominion on: 03/04/2015 03:24 PM   Modules accepted: Orders

## 2015-03-05 LAB — NMR, LIPOPROFILE
CHOLESTEROL: 161 mg/dL (ref 100–199)
HDL Cholesterol by NMR: 65 mg/dL (ref >39)
HDL Particle Number: 38.1 umol/L (ref >=30.5)
LDL PARTICLE NUMBER: 949 nmol/L (ref <1000)
LDL Size: 20.9 nm (ref >20.5)
LDL-C: 78 mg/dL (ref 0–99)
LP-IR Score: <25 (ref <=45)
Small LDL Particle Number: 330 nmol/L (ref <=527)
TRIGLYCERIDES BY NMR: 88 mg/dL (ref 0–149)

## 2015-03-05 LAB — HEPATIC FUNCTION PANEL
ALT: 18 IU/L (ref 0–32)
AST: 19 IU/L (ref 0–40)
Albumin: 4.1 g/dL (ref 3.6–4.8)
Alkaline Phosphatase: 76 IU/L (ref 39–117)
Bilirubin Total: 0.3 mg/dL (ref 0.0–1.2)
Bilirubin, Direct: 0.1 mg/dL (ref 0.00–0.40)
Total Protein: 6.1 g/dL (ref 6.0–8.5)

## 2015-03-05 LAB — BMP8+EGFR
BUN/Creatinine Ratio: 21 (ref 11–26)
BUN: 14 mg/dL (ref 8–27)
CALCIUM: 9.7 mg/dL (ref 8.7–10.3)
CO2: 25 mmol/L (ref 18–29)
Chloride: 102 mmol/L (ref 97–108)
Creatinine, Ser: 0.68 mg/dL (ref 0.57–1.00)
GFR calc non Af Amer: 93 mL/min/{1.73_m2} (ref >59)
GFR, EST AFRICAN AMERICAN: 107 mL/min/{1.73_m2} (ref >59)
Glucose: 82 mg/dL (ref 65–99)
POTASSIUM: 4.2 mmol/L (ref 3.5–5.2)
Sodium: 142 mmol/L (ref 134–144)

## 2015-03-05 LAB — VITAMIN D 25 HYDROXY (VIT D DEFICIENCY, FRACTURES): VIT D 25 HYDROXY: 61.2 ng/mL (ref 30.0–100.0)

## 2015-03-05 LAB — FECAL OCCULT BLOOD, IMMUNOCHEMICAL: FECAL OCCULT BLD: NEGATIVE

## 2015-03-06 ENCOUNTER — Other Ambulatory Visit: Payer: Self-pay | Admitting: Family Medicine

## 2015-03-07 DIAGNOSIS — H02834 Dermatochalasis of left upper eyelid: Secondary | ICD-10-CM | POA: Insufficient documentation

## 2015-03-07 DIAGNOSIS — H02831 Dermatochalasis of right upper eyelid: Secondary | ICD-10-CM | POA: Insufficient documentation

## 2015-03-10 NOTE — Progress Notes (Signed)
Patient aware of results.

## 2015-04-05 ENCOUNTER — Other Ambulatory Visit: Payer: Self-pay | Admitting: Orthopaedic Surgery

## 2015-04-05 DIAGNOSIS — M25562 Pain in left knee: Secondary | ICD-10-CM

## 2015-04-06 ENCOUNTER — Ambulatory Visit
Admission: RE | Admit: 2015-04-06 | Discharge: 2015-04-06 | Disposition: A | Discharge status: Home / Self Care | Payer: BC Managed Care – PPO | Source: Ambulatory Visit | Attending: Orthopaedic Surgery | Admitting: Orthopaedic Surgery

## 2015-04-06 DIAGNOSIS — M25562 Pain in left knee: Secondary | ICD-10-CM

## 2015-04-12 ENCOUNTER — Telehealth: Payer: Self-pay | Admitting: *Deleted

## 2015-04-12 MED ORDER — AZITHROMYCIN 250 MG PO TABS: ORAL_TABLET | ORAL | Status: DC

## 2015-04-12 NOTE — Telephone Encounter (Signed)
Pt has cough and congestion- she wants antibiotic called in - she will soon be having knee surgery and wants to be WELL.   She has used Zpak fine in the past - please address

## 2015-04-12 NOTE — Telephone Encounter (Signed)
Please call this patient in a Z-Pak and have her take Mucinex maximum strength plain twice daily

## 2015-04-12 NOTE — Telephone Encounter (Signed)
Pt aware.

## 2015-04-15 ENCOUNTER — Other Ambulatory Visit: Payer: Self-pay

## 2015-04-15 DIAGNOSIS — I359 Nonrheumatic aortic valve disorder, unspecified: Secondary | ICD-10-CM

## 2015-04-18 ENCOUNTER — Encounter: Payer: Self-pay | Admitting: Family Medicine

## 2015-04-18 ENCOUNTER — Ambulatory Visit (INDEPENDENT_AMBULATORY_CARE_PROVIDER_SITE_OTHER): Payer: BC Managed Care – PPO | Admitting: Family Medicine

## 2015-04-18 ENCOUNTER — Ambulatory Visit (INDEPENDENT_AMBULATORY_CARE_PROVIDER_SITE_OTHER): Payer: BC Managed Care – PPO

## 2015-04-18 ENCOUNTER — Telehealth: Payer: Self-pay | Admitting: Family Medicine

## 2015-04-18 VITALS — BP 139/68 | HR 93 | Temp 98.4°F | Ht 68.0 in | Wt 188.0 lb

## 2015-04-18 DIAGNOSIS — R059 Cough, unspecified: Secondary | ICD-10-CM

## 2015-04-18 DIAGNOSIS — R0781 Pleurodynia: Secondary | ICD-10-CM | POA: Diagnosis not present

## 2015-04-18 DIAGNOSIS — R05 Cough: Secondary | ICD-10-CM

## 2015-04-18 DIAGNOSIS — J209 Acute bronchitis, unspecified: Secondary | ICD-10-CM

## 2015-04-18 DIAGNOSIS — J9801 Acute bronchospasm: Secondary | ICD-10-CM

## 2015-04-18 LAB — POCT CBC
GRANULOCYTE PERCENT: 83.4 % — AB (ref 37–80)
HCT, POC: 37.6 % — AB (ref 37.7–47.9)
Hemoglobin: 12.3 g/dL (ref 12.2–16.2)
LYMPH, POC: 1.5 (ref 0.6–3.4)
MCH: 30.4 pg (ref 27–31.2)
MCHC: 32.7 g/dL (ref 31.8–35.4)
MCV: 93 fL (ref 80–97)
MPV: 7.4 fL (ref 0–99.8)
POC GRANULOCYTE: 11.2 — AB (ref 2–6.9)
POC LYMPH PERCENT: 11.2 %L (ref 10–50)
Platelet Count, POC: 339 10*3/uL (ref 142–424)
RBC: 4.05 M/uL (ref 4.04–5.48)
RDW, POC: 13 %
WBC: 13.4 10*3/uL — AB (ref 4.6–10.2)

## 2015-04-18 MED ORDER — PREDNISONE 10 MG PO TABS: ORAL_TABLET | ORAL | Status: DC

## 2015-04-18 MED ORDER — HYDROCOD POLST-CPM POLST ER 10-8 MG/5ML PO SUER: 5.0000 mL | Freq: Every evening | ORAL | Status: DC | PRN

## 2015-04-18 MED ORDER — METHYLPREDNISOLONE ACETATE 80 MG/ML IJ SUSP
60.0000 mg | Freq: Once | INTRAMUSCULAR | Status: AC
  Administered 2015-04-18: 60 mg via INTRAMUSCULAR

## 2015-04-18 MED ORDER — LEVOFLOXACIN 500 MG PO TABS: 500.0000 mg | ORAL_TABLET | Freq: Every day | ORAL | Status: DC

## 2015-04-18 NOTE — Progress Notes (Signed)
Subjective:    Patient ID: Shelly Bennett, female    DOB: 27-Oct-1951, 64 y.o.   MRN: [id number]  HPI Pt here for cough and congestion. She has completed a z pak and is now having rib pain.  The patient has had sneezing chest congestion and cough and now has rib pain. She also has a persistent dry cough that is minimally productive of clear to slightly green sputum. She continues to take Mucinex.      Patient Active Problem List   Diagnosis Date Noted  . Vitamin D deficiency 08/19/2014  . Family history of colon cancer-father at 56+ grandparent w/ rectal cancer 01/14/2014  . Osteopenia 08/12/2013  . CAD (coronary artery disease), native coronary artery 03/22/2013  . Aortic valve stenosis, mild 03/22/2013  . Hyperlipemia 03/11/2013  . Hypertension 03/11/2013  . Allergic rhinitis 03/11/2013  . Left shoulder pain 03/11/2013  . IBS (irritable bowel syndrome) - with chronic recurrent abdominal pain 02/01/2012  . Personal history of colonic polyps - adenoma 02/01/2012  . Coccydynia 02/01/2012   Outpatient Encounter Prescriptions as of 04/18/2015  . Order #: [id number] Class: Normal  . Order #: [id number] Class: Historical Med  . Order #: [id number] Class: Historical Med  . Order #: [id number] Class: Historical Med  . Order #: [id number] Class: Historical Med  . Order #: [id number] Class: Historical Med  . Order #: [id number] Class: Historical Med  . Order #: [id number] Class: Normal  . Order #: [id number] Class: Historical Med  . Order #: [id number] Class: Normal  . Order #: [id number] Class: Normal  . Order #: [id number] Class: Normal  . Order #: [id number] Class: Historical Med  . Order #: [id number] Class: Print  . Order #: [id number] Class: Print  . Order #: [id number] Class: Historical Med  . [DISCONTINUED] Order #: [id number] Class: Normal     Review of Systems  Constitutional: Positive for fever (low grd).  HENT: Positive for congestion and sneezing.   Eyes: Negative.   Respiratory:  Positive for cough.   Cardiovascular: Negative.   Gastrointestinal: Negative.   Endocrine: Negative.   Genitourinary: Negative.   Musculoskeletal: Positive for arthralgias (rib pain).  Skin: Negative.   Allergic/Immunologic: Negative.   Neurological: Negative.   Hematological: Negative.   Psychiatric/Behavioral: Negative.        Objective:   Physical Exam  Constitutional: She is oriented to person, place, and time. She appears well-developed and well-nourished. She appears distressed.  The patient comes with a walker as she has a torn meniscus and is pending for surgery and less than 2 weeks  HENT:  Head: Normocephalic and atraumatic.  Right Ear: External ear normal.  Left Ear: External ear normal.  Mouth/Throat: Oropharynx is clear and moist.  Throat appears normal. Nasal congestion bilaterally  Eyes: Conjunctivae and EOM are normal. Pupils are equal, round, and reactive to light. Right eye exhibits no discharge. Left eye exhibits no discharge. No scleral icterus.  Neck: Normal range of motion. Neck supple. No thyromegaly present.  No adenopathy  Cardiovascular: Normal rate, regular rhythm and normal heart sounds.  Exam reveals no friction rub.   No murmur heard. Pulmonary/Chest: Effort normal and breath sounds normal. No respiratory distress. She has no wheezes. She has no rales. She exhibits no tenderness.  The patient has a dry cough and no wheezes  Musculoskeletal: She exhibits no edema or tenderness.  The patient has a hesitant walk due to the bilateral torn meniscus in her left knee  Lymphadenopathy:  She has no cervical adenopathy.  Neurological: She is alert and oriented to person, place, and time.  Skin: Skin is warm and dry. No rash noted.  Psychiatric: She has a normal mood and affect. Her behavior is normal. Judgment and thought content normal.  She is stressed with the coughing fatigue and with the family situation in general at home.  Nursing note and vitals  reviewed.  BP 139/68 mmHg  Pulse 93  Temp(Src) 98.4 F (36.9 C) (Oral)  Ht 5\' 8"  (1.727 m)  Wt 188 lb (85.276 kg)  BMI 28.59 kg/m2  [facility] reading (PRIMARY) by  Dr. Laurance Flatten- CXR and Left rib detail--  left lower lobe pneumonia                               Results for orders placed or performed in visit on 04/18/15  POCT CBC  Result Value Ref Range   WBC 13.4 (A) 4.6 - 10.2 K/uL   Lymph, poc 1.5 0.6 - 3.4   POC LYMPH PERCENT 11.2 10 - 50 %L   POC Granulocyte 11.2 (A) 2 - 6.9   Granulocyte percent 83.4 (A) 37 - 80 %G   RBC 4.05 4.04 - 5.48 M/uL   Hemoglobin 12.3 12.2 - 16.2 g/dL   HCT, POC 37.6 (A) 37.7 - 47.9 %   MCV 93.0 80 - 97 fL   MCH, POC 30.4 27 - 31.2 pg   MCHC 32.7 31.8 - 35.4 g/dL   RDW, POC 13.0 %   Platelet Count, POC 339 142 - 424 K/uL   MPV 7.4 0 - 99.8 fL          Assessment & Plan:  1. Cough -Continue to take Mucinex -Take antibiotic as directed - POCT CBC - DG Chest 2 View; Future  2. Bronchospasm -Drink plenty of fluids and use inhaler as directed -Depo-Medrol 60 mg IM -Levaquin 500 one daily for 7 days -Use Brio inhaler 1 puff daily and rinse mouth after using  3. Rib pain on left side -X-ray with rib detail today   4. Bronchitis -X-ray confirms left lower lobe pneumonia  Meds ordered this encounter  Medications  . levofloxacin (LEVAQUIN) 500 MG tablet    Sig: Take 1 tablet (500 mg total) by mouth daily.    Dispense:  7 tablet    Refill:  0  . predniSONE (DELTASONE) 10 MG tablet    Sig: 1 tablet 4 times a day for 2 days,  1 tablet 3 times a day for 2 days,  1 tablet 2 times a day for 2 days, 1 tablet daily for 2 days    Dispense:  20 tablet    Refill:  0  . methylPREDNISolone acetate (DEPO-MEDROL) injection 60 mg    Sig:    Patient Instructions   Continue Mucinex  Continue to drink plenty of fluids  Use cool mist humidifier   take antibiotic as directed -Drink plenty of fluids -Use inhaler  Arrie Senate MD

## 2015-04-18 NOTE — Addendum Note (Signed)
Addended by: Zannie Cove on: 04/18/2015 03:52 PM   Modules accepted: Orders

## 2015-04-18 NOTE — Telephone Encounter (Signed)
appt made

## 2015-04-18 NOTE — Patient Instructions (Addendum)
Continue Mucinex  Continue to drink plenty of fluids  Use cool mist humidifier Take antibiotic and cough med as directed.  Use inhaler as directed

## 2015-04-20 ENCOUNTER — Other Ambulatory Visit (HOSPITAL_COMMUNITY): Payer: BC Managed Care – PPO

## 2015-04-22 ENCOUNTER — Other Ambulatory Visit: Payer: Self-pay

## 2015-04-22 ENCOUNTER — Ambulatory Visit (HOSPITAL_COMMUNITY): Payer: BC Managed Care – PPO | Attending: Cardiology

## 2015-04-22 DIAGNOSIS — I10 Essential (primary) hypertension: Secondary | ICD-10-CM | POA: Insufficient documentation

## 2015-04-22 DIAGNOSIS — I359 Nonrheumatic aortic valve disorder, unspecified: Secondary | ICD-10-CM | POA: Diagnosis not present

## 2015-04-22 DIAGNOSIS — E785 Hyperlipidemia, unspecified: Secondary | ICD-10-CM | POA: Diagnosis not present

## 2015-04-25 ENCOUNTER — Encounter: Payer: Self-pay | Admitting: Family Medicine

## 2015-04-25 ENCOUNTER — Ambulatory Visit (INDEPENDENT_AMBULATORY_CARE_PROVIDER_SITE_OTHER): Payer: BC Managed Care – PPO | Admitting: Family Medicine

## 2015-04-25 VITALS — BP 142/71 | HR 86 | Temp 97.2°F | Ht 68.0 in | Wt 188.0 lb

## 2015-04-25 DIAGNOSIS — J181 Lobar pneumonia, unspecified organism: Principal | ICD-10-CM

## 2015-04-25 DIAGNOSIS — M25562 Pain in left knee: Secondary | ICD-10-CM | POA: Diagnosis not present

## 2015-04-25 DIAGNOSIS — I350 Nonrheumatic aortic (valve) stenosis: Secondary | ICD-10-CM | POA: Diagnosis not present

## 2015-04-25 DIAGNOSIS — J189 Pneumonia, unspecified organism: Secondary | ICD-10-CM

## 2015-04-25 NOTE — Progress Notes (Signed)
Subjective:    Patient ID: Shelly Bennett, female    DOB: May 14, 1951, 64 y.o.   MRN: [id number]  HPI Patient here today for 1 week follow up on pneumonia. She finished meds yesterday and is feeling better. The patient still has a cough. The x-rays that were done indicated that she had a left lower lobe pneumonia. The patient has had an echocardiogram and plans to see the cardiologist tomorrow in preparation for her knee surgery when her lungs are healed. She still coughing but is feeling much better. She's been using the inhaler and has 7 more days' worth of this. She is drinking plenty of fluids and still taking Mucinex.      Patient Active Problem List   Diagnosis Date Noted  . Vitamin D deficiency 08/19/2014  . Family history of colon cancer-father at 14+ grandparent w/ rectal cancer 01/14/2014  . Osteopenia 08/12/2013  . CAD (coronary artery disease), native coronary artery 03/22/2013  . Aortic valve stenosis, mild 03/22/2013  . Hyperlipemia 03/11/2013  . Hypertension 03/11/2013  . Allergic rhinitis 03/11/2013  . Left shoulder pain 03/11/2013  . IBS (irritable bowel syndrome) - with chronic recurrent abdominal pain 02/01/2012  . Personal history of colonic polyps - adenoma 02/01/2012  . Coccydynia 02/01/2012   Outpatient Encounter Prescriptions as of 04/25/2015  Medication Sig  . amLODipine (NORVASC) 10 MG tablet TAKE ONE TABLET BY MOUTH ONE  TIME DAILY  . aspirin 81 MG tablet Take 81 mg by mouth daily.   . calcium carbonate 200 MG capsule Take 250 mg by mouth 2 (two) times daily with a meal.  . chlorpheniramine-HYDROcodone (TUSSIONEX PENNKINETIC ER) 10-8 MG/5ML SUER Take 5 mLs by mouth at bedtime as needed for cough.  . Cholecalciferol (VITAMIN D-3) 5000 UNITS TABS Take 1 capsule by mouth daily. Mon thru Friday  . desonide (DESOWEN) 0.05 % cream   . fexofenadine (ALLEGRA) 180 MG tablet Take 180 mg by mouth daily.  Marland Kitchen guaiFENesin (MUCINEX) 600 MG 12 hr tablet Take 1,200 mg  by mouth. As needed  . hydrochlorothiazide (HYDRODIURIL) 25 MG tablet TAKE ONE TABLET BY MOUTH ONE  TIME DAILY  . LOTEMAX 0.5 % ophthalmic suspension   . meloxicam (MOBIC) 15 MG tablet Take 1 tablet (15 mg total) by mouth daily.  Marland Kitchen NASONEX 50 MCG/ACT nasal spray USE TWO SPRAYS EACH NOSTRIL ONCE DAILY.  Marland Kitchen omega-3 acid ethyl esters (LOVAZA) 1 G capsule TAKE ONE CAPSULE BY MOUTH FOUR TIMES DAILY  . omeprazole (PRILOSEC) 20 MG capsule Take 20 mg by mouth daily. One by mouth Mon Wed Friday  . oxyCODONE-acetaminophen (PERCOCET/ROXICET) 5-325 MG per tablet Take 1-2 tablets by mouth every 4 (four) hours as needed for moderate pain or severe pain.  . rosuvastatin (CRESTOR) 40 MG tablet Take 1 tablet (40 mg total) by mouth daily. As directed  . valACYclovir (VALTREX) 1000 MG tablet Take 1 tablet by mouth daily.  . [DISCONTINUED] levofloxacin (LEVAQUIN) 500 MG tablet Take 1 tablet (500 mg total) by mouth daily.  . [DISCONTINUED] predniSONE (DELTASONE) 10 MG tablet 1 tablet 4 times a day for 2 days,  1 tablet 3 times a day for 2 days,  1 tablet 2 times a day for 2 days, 1 tablet daily for 2 days   No facility-administered encounter medications on file as of 04/25/2015.     Review of Systems  Constitutional: Negative.   HENT: Negative.   Eyes: Negative.   Respiratory: Positive for cough.   Cardiovascular: Negative.  Gastrointestinal: Negative.   Endocrine: Negative.   Genitourinary: Negative.   Musculoskeletal: Negative.   Skin: Negative.   Allergic/Immunologic: Negative.   Neurological: Negative.   Hematological: Negative.   Psychiatric/Behavioral: Negative.        Objective:   Physical Exam  Constitutional: She is oriented to person, place, and time. She appears well-developed and well-nourished. No distress.  The patient is somewhat anxious and distressed and still feeling very tired following this bout with pneumonia.  HENT:  Right Ear: External ear normal.  Left Ear: External ear  normal.  Mouth/Throat: Oropharynx is clear and moist. No oropharyngeal exudate.  Eyes: Conjunctivae and EOM are normal. Pupils are equal, round, and reactive to light. Right eye exhibits no discharge. Left eye exhibits no discharge. No scleral icterus.  Neck: Normal range of motion. Neck supple. No thyromegaly present.  No carotid bruits or anterior cervical adenopathy  Cardiovascular: Normal rate and regular rhythm.  Exam reveals no gallop and no friction rub.   Murmur heard. Regular rate and rhythm at 31/V with systolic ejection murmur grade 2/6  Pulmonary/Chest: Effort normal and breath sounds normal. No respiratory distress. She has no wheezes. She has no rales.  Dry cough without wheezing or rhonchi  Musculoskeletal: She exhibits no edema.  The patient uses a rolling walker because of the knee pain and torn meniscus  Lymphadenopathy:    She has no cervical adenopathy.  Neurological: She is alert and oriented to person, place, and time.  Skin: Skin is warm and dry. No rash noted.  Psychiatric: She has a normal mood and affect. Her behavior is normal. Judgment and thought content normal.  Nursing note and vitals reviewed.   BP 142/71 mmHg  Pulse 86  Temp(Src) 97.2 F (36.2 C) (Oral)  Ht 5\' 8"  (1.727 m)  Wt 188 lb (85.276 kg)  BMI 28.59 kg/m2       Assessment & Plan:  1. Pneumonia, organism unspecified -The patient is doing better and feeling better. She is very weak and tired at this point. We will plan to get a repeat chest x-ray in the next 3-4 weeks which was recommended by the radiologist. Hopefully by that time she will be ready for her knee surgery. - DG Chest 2 View; Future  2. Left lower lobe pneumonia -This is improved as the patient is breathing better and feeling better and has finished her antibiotics.  3. Aortic valve stenosis, mild -The patient has a planned visit with her cardiologist tomorrow for a cardiac clearance for her surgery on the left knee  4.  Left knee pain -Hopefully surgery can be done and the next weeks after the next chest x-ray and visit. She has been encouraged to practice good pulmonary hygiene take her Mucinex and drink plenty of fluids  Patient Instructions  Continue to drink plenty of fluids Continue to use inhaler Continue to take Mucinex maximum strength plain and blue and white in color one twice daily with a large glass of water See the cardiologist as planned Hopefully you can arrange to have your surgery done in 4 weeks We will see you back in 3-4 weeks for repeat chest x-ray and to listen to your lungs again at that time. Practice good pulmonary hygiene and avoid irritating environments   Arrie Senate MD

## 2015-04-25 NOTE — Patient Instructions (Signed)
Continue to drink plenty of fluids Continue to use inhaler Continue to take Mucinex maximum strength plain and blue and white in color one twice daily with a large glass of water See the cardiologist as planned Hopefully you can arrange to have your surgery done in 4 weeks We will see you back in 3-4 weeks for repeat chest x-ray and to listen to your lungs again at that time. Practice good pulmonary hygiene and avoid irritating environments

## 2015-04-26 ENCOUNTER — Encounter: Payer: Self-pay | Admitting: Cardiovascular Disease

## 2015-04-26 ENCOUNTER — Ambulatory Visit (INDEPENDENT_AMBULATORY_CARE_PROVIDER_SITE_OTHER): Payer: BC Managed Care – PPO | Admitting: Cardiovascular Disease

## 2015-04-26 VITALS — BP 122/60 | HR 76 | Ht 68.0 in | Wt 187.8 lb

## 2015-04-26 DIAGNOSIS — Q231 Congenital insufficiency of aortic valve: Secondary | ICD-10-CM

## 2015-04-26 DIAGNOSIS — I2510 Atherosclerotic heart disease of native coronary artery without angina pectoris: Secondary | ICD-10-CM

## 2015-04-26 NOTE — Patient Instructions (Signed)
Medication Instructions:  Your physician recommends that you continue on your current medications as directed. Please refer to the Current Medication list given to you today.  Labwork: No new orders.  Testing/Procedures: No new orders.  Follow-Up: Your physician wants you to follow-up in: 1 YEAR with Dr Burt Knack.  You will receive a reminder letter in the mail two months in advance. If you don't receive a letter, please call our office to schedule the follow-up appointment.   Any Other Special Instructions Will Be Listed Below (If Applicable).  You are cleared for knee surgery.

## 2015-04-26 NOTE — Progress Notes (Signed)
Cardiology Office Note   Date:  04/26/2015   ID:  FATEMA RABE, DOB [birth year], MRN [id number]  PCP:  Redge Gainer, MD  Cardiologist:  Sherren Mocha, MD    Chief Complaint  Patient presents with  . Coronary Artery Disease     History of Present Illness: Shelly Bennett is a 64 y.o. female who presents for preoperative cardiac clearance prior to knee arthroscopy. She has been seen by Dr Lia Foyer in the past and is now establishing care with me.   She has been followed for CAD. She underwent cardiac cath in 2005 and was noted to have moderate stenosis of the mid-LAD with medical therapy recommended. She's had no ischemic events and at the time she presented with resting chest pain felt to be unrelated to her CAD. She has also been diagnosed with a bicuspid aortic valve.   The patient had a left knee injury and is awaiting knee surgery. She was scheduled to have surgery later this week but unfortunately she developed pneumonia and surgery has been postponed. Her cough has improved on antibiotics and she is beginning to feel better. A follow-up CXR is planned prior to knee surgery.   She's been under a great deal of stress related to taking care of her son who had a serious injury several months ago. She did have one episode of chest pain a few months ago at a time of high stress, but has had no other symptoms. She denies exertional chest discomfort. She's had no chest pain or pressure since her episode a few months ago. Denies edema, orthopnea, or PND. No heart palpitations.   Past Medical History  Diagnosis Date  . Arthritis   . Chronic headaches   . Adenomatous colon polyp   . CAD (coronary artery disease)   . HLD (hyperlipidemia)   . HTN (hypertension)   . Diverticulosis   . IBS (irritable bowel syndrome)   . Obesity   . External hemorrhoids   . Cystocele     Past Surgical History  Procedure Laterality Date  . Knee surgery      right  . Carpal tunnel release     . Colonoscopy  12/15/2008    diverticulosis, external hemorrhoids  . Laproscopy      Current Outpatient Prescriptions  Medication Sig Dispense Refill  . amLODipine (NORVASC) 10 MG tablet TAKE ONE TABLET BY MOUTH ONE  TIME DAILY 30 tablet 2  . aspirin 81 MG tablet Take 81 mg by mouth daily.     . calcium carbonate 200 MG capsule Take 250 mg by mouth 2 (two) times daily with a meal.    . chlorpheniramine-HYDROcodone (TUSSIONEX PENNKINETIC ER) 10-8 MG/5ML SUER Take 5 mLs by mouth at bedtime as needed for cough. 140 mL 0  . Cholecalciferol (VITAMIN D-3) 5000 UNITS TABS Take 1 capsule by mouth daily. Mon thru Friday    . desonide (DESOWEN) 0.05 % cream Apply 1 application topically as needed (face irritation).     . fexofenadine (ALLEGRA) 180 MG tablet Take 180 mg by mouth daily.    Marland Kitchen guaiFENesin (MUCINEX) 600 MG 12 hr tablet Take 1,200 mg by mouth. As needed    . hydrochlorothiazide (HYDRODIURIL) 25 MG tablet TAKE ONE TABLET BY MOUTH ONE  TIME DAILY 30 tablet 2  . NASONEX 50 MCG/ACT nasal spray USE TWO SPRAYS EACH NOSTRIL ONCE DAILY. 17 g 1  . omega-3 acid ethyl esters (LOVAZA) 1 G capsule TAKE ONE CAPSULE BY MOUTH FOUR TIMES  DAILY 120 capsule 4  . omeprazole (PRILOSEC) 20 MG capsule Take 20 mg by mouth daily. One by mouth Mon Wed Friday    . oxyCODONE-acetaminophen (PERCOCET/ROXICET) 5-325 MG per tablet Take 1-2 tablets by mouth every 4 (four) hours as needed for moderate pain or severe pain. 15 tablet 0  . rosuvastatin (CRESTOR) 40 MG tablet Take 1 tablet (40 mg total) by mouth daily. As directed 90 tablet 1  . valACYclovir (VALTREX) 1000 MG tablet Take 1 tablet by mouth daily.     No current facility-administered medications for this visit.    Allergies:   Penicillins; Ace inhibitors; Angiotensin receptor blockers; Vytorin; and Zetia   Social History:  The patient  reports that she has never smoked. She has never used smokeless tobacco. She reports that she does not drink alcohol or use  illicit drugs.   Family History:  The patient's family history includes Colon cancer (age of onset: 92) in her father; Heart disease in her brother; Hyperlipidemia in her mother and sister; Hypertension in her mother and sister; Kidney disease in her father and mother; Osteoporosis in her mother; Prostate cancer in her father.    ROS:  Please see the history of present illness.  Otherwise, review of systems is positive for cough, knee pain.  All other systems are reviewed and negative.    PHYSICAL EXAM: VS:  BP 122/60 mmHg  Pulse 76  Ht 5\' 8"  (1.727 m)  Wt 187 lb 12.8 oz (85.186 kg)  BMI 28.56 kg/m2  SpO2 96% , BMI Body mass index is 28.56 kg/(m^2). GEN: Well nourished, well developed, in no acute distress HEENT: normal Neck: no JVD, no masses. No carotid bruits Cardiac: RRR with 2/6 harsh early peaking systolic murmur at the RUSB              Respiratory:  clear to auscultation bilaterally, normal work of breathing GI: soft, nontender, nondistended, + BS MS: no deformity or atrophy Ext: no pretibial edema, pedal pulses 2+= bilaterally Skin: warm and dry, no rash Neuro:  Strength and sensation are intact Psych: euthymic mood, full affect  EKG:  EKG is not ordered today. EKG from 2.2.16 reviewed and shows NSR< within normal limits  Recent Labs: 03/04/2015: ALT 18; BUN 14; Creatinine 0.68; Potassium 4.2; Sodium 142 04/18/2015: Hemoglobin 12.3   Lipid Panel     Component Value Date/Time   CHOL 161 03/04/2015 1031   TRIG 88 03/04/2015 1031   HDL 65 03/04/2015 1031   LDLCALC 74 08/19/2014 0920      Wt Readings from Last 3 Encounters:  04/26/15 187 lb 12.8 oz (85.186 kg)  04/25/15 188 lb (85.276 kg)  04/18/15 188 lb (85.276 kg)     Cardiac Studies Reviewed: 2D Echo 04/22/2015: Study Conclusions  - Left ventricle: The cavity size was normal. Wall thickness was normal. Systolic function was normal. The estimated ejection fraction was in the range of 60% to 65%. Wall  motion was normal; there were no regional wall motion abnormalities. Features are consistent with a pseudonormal left ventricular filling pattern, with concomitant abnormal relaxation and increased filling pressure (grade 2 diastolic dysfunction). - Aortic valve: A bicuspid morphology cannot be excluded; mildly thickened, moderately calcified leaflets. Cusp separation was reduced. There was very mild stenosis. Peak velocity (S): 223 cm/s. Mean gradient (S): 11 mm Hg. - Mitral valve: There was mild regurgitation.  Cardiac Cath 12/13/20015: HEMODYNAMIC DATA: 1. Central aortic pressure 145/85, mean 111. 2. Left ventricular pressure 130/13. 3. No gradient  on pullback across the aortic valve.  ANGIOGRAPHIC DATA: 1. Ventriculography was performed in the RAO projection. Overall systolic  function was vigorous. Ejection fraction exceeded 60%. No wall motion  abnormalities were identified. 2. The left main coronary artery was free of critical disease. 3. There is very mild calcification noted eccentrically noted at the LAD  just after the takeoff of the large diagonal branch. The LAD has about  a 50-70% stenosis just after the takeoff of the large diagonal branch.  The diagonal branch itself has tandem areas of about 30% narrowing prior  to its bifurcation, but no high grade areas of obstruction. The distal  left anterior descending artery is without significant disease. 4. The circumflex has about 20-30% narrowing in the proximal vessel. The  vessel bifurcates into an AV circumflex and a moderate marginal branch  other than minimal luminal irregularity, no high grade areas of focal  stenosis are identified. 5. The right coronary artery is a large caliber vessel with posterior  descending and posterolateral branch both of which are free of critical  disease.  CONCLUSIONS: 1. Well preserved left  ventricular function. 2. Moderate to moderately severe stenosis of the left anterior descending  artery right at the diagonal bifurcation. 3. Other minor areas of irregularity as noted above.  DISPOSITION: Dr. Vicenta Aly and I have reviewed the films carefully. The patient has an LAD stenosis which in the RAO cranial views is most severe. In the other views it appears less severe and the residual lumen appears to be adequate such that rest pain would seem unlikely from the findings itself. From an interventional and therapeutic standpoint, the stenosis itself involves the bifurcation and therefore treatment would likely involve some compromise of the very large diagonal branch. We will initiate medical therapy. Stress Cardiolite imaging to assess this ischemia will be recommended. If this is negative a continued medical approach will be warranted. If this is positive or the patient continues to have recurrent pain, then intervention could be considered. It would likely require a provisional cutting balloon approach. This will be reviewed with the family in detail. She will be started on beta blockade. Additionally, antiplatelet therapy will also be considered.   ASSESSMENT AND PLAN: 1.  CAD, native vessel, without symptoms of angina. Cath from 2005 reviewed. At low cardiac risk from knee surgery. OK to proceed without further cardiac testing. Should continue risk reduction measures with ASA and a statin drug.   2. Bicuspid aortic valve with mild aortic stenosis. Recent echo reviewed and will continue observation. Consider repeat echo in about 2 years pending any exam changes. I'll see back in one year for follow-up.   3. HTN, essential: BP well-controlled on current medical rx  4. Hyperlipidemia: NMR lipomed panel reviewed and she is at goal on statin Rx.    Current medicines are reviewed with the patient today.  The patient does not have concerns  regarding medicines.  Labs/ tests ordered today include:  No orders of the defined types were placed in this encounter.    Disposition:   FU one year  Signed, Sherren Mocha, MD  04/26/2015 5:44 PM    [address], [place], [place]  [address] Phone: [phone]; Fax: [phone]

## 2015-04-28 ENCOUNTER — Telehealth: Payer: Self-pay | Admitting: Family Medicine

## 2015-04-29 NOTE — Telephone Encounter (Signed)
Patient aware that we do not have any samples of the Breo 100 and to try back on Monday.

## 2015-05-02 ENCOUNTER — Telehealth: Payer: Self-pay | Admitting: Family Medicine

## 2015-05-02 MED ORDER — FLUTICASONE FUROATE-VILANTEROL 100-25 MCG/INH IN AEPB
1.0000 | INHALATION_SPRAY | Freq: Every day | RESPIRATORY_TRACT | Status: DC
Start: 2015-05-02 — End: 2015-06-26

## 2015-05-02 NOTE — Telephone Encounter (Signed)
Patient aware we do not have any samples and rx sent to pharmacy and patient will come by for a coupon.

## 2015-05-17 ENCOUNTER — Encounter (INDEPENDENT_AMBULATORY_CARE_PROVIDER_SITE_OTHER): Payer: Self-pay

## 2015-05-17 ENCOUNTER — Ambulatory Visit (INDEPENDENT_AMBULATORY_CARE_PROVIDER_SITE_OTHER): Payer: BC Managed Care – PPO

## 2015-05-17 ENCOUNTER — Ambulatory Visit (INDEPENDENT_AMBULATORY_CARE_PROVIDER_SITE_OTHER): Payer: BC Managed Care – PPO | Admitting: Family Medicine

## 2015-05-17 ENCOUNTER — Other Ambulatory Visit: Payer: Self-pay | Admitting: Family Medicine

## 2015-05-17 ENCOUNTER — Encounter: Payer: Self-pay | Admitting: Family Medicine

## 2015-05-17 VITALS — BP 134/79 | HR 66 | Temp 97.3°F | Ht 68.0 in | Wt 189.0 lb

## 2015-05-17 DIAGNOSIS — M25562 Pain in left knee: Secondary | ICD-10-CM

## 2015-05-17 DIAGNOSIS — J189 Pneumonia, unspecified organism: Secondary | ICD-10-CM

## 2015-05-17 DIAGNOSIS — J181 Lobar pneumonia, unspecified organism: Principal | ICD-10-CM

## 2015-05-17 NOTE — Patient Instructions (Signed)
The patient is cleared for her meniscectomy and hopefully to get this done this week She is also seen the cardiologist and gotten a clearance from him She should continue with the inhaler at least for a couple more weeks and take her Mucinex regularly during this time and drink plenty of fluids

## 2015-05-17 NOTE — Progress Notes (Signed)
Subjective:    Patient ID: Shelly Bennett, female    DOB: [birth year], 64 y.o.   MRN: [id number]  HPI Patient here today for follow up on left lower lobe pneumonia. The patient is doing much better and is ready to have her left knee surgery. The chest x-ray was reviewed before going into the exam room and the pneumonia has cleared. She only has a slight cough.      Patient Active Problem List   Diagnosis Date Noted  . Vitamin D deficiency 08/19/2014  . Family history of colon cancer-father at 13+ grandparent w/ rectal cancer 01/14/2014  . Osteopenia 08/12/2013  . CAD (coronary artery disease), native coronary artery 03/22/2013  . Aortic valve stenosis, mild 03/22/2013  . Hyperlipemia 03/11/2013  . Hypertension 03/11/2013  . Allergic rhinitis 03/11/2013  . Left shoulder pain 03/11/2013  . IBS (irritable bowel syndrome) - with chronic recurrent abdominal pain 02/01/2012  . Personal history of colonic polyps - adenoma 02/01/2012  . Coccydynia 02/01/2012   Outpatient Encounter Prescriptions as of 05/17/2015  Medication Sig  . amLODipine (NORVASC) 10 MG tablet TAKE ONE TABLET BY MOUTH ONE  TIME DAILY  . aspirin 81 MG tablet Take 81 mg by mouth daily.   . calcium carbonate 200 MG capsule Take 250 mg by mouth 2 (two) times daily with a meal.  . Cholecalciferol (VITAMIN D-3) 5000 UNITS TABS Take 1 capsule by mouth daily. Mon thru Friday  . desonide (DESOWEN) 0.05 % cream Apply 1 application topically as needed (face irritation).   . fexofenadine (ALLEGRA) 180 MG tablet Take 180 mg by mouth daily.  . Fluticasone Furoate-Vilanterol (BREO ELLIPTA) 100-25 MCG/INH AEPB Inhale 1 puff into the lungs daily.  Marland Kitchen guaiFENesin (MUCINEX) 600 MG 12 hr tablet Take 1,200 mg by mouth. As needed  . hydrochlorothiazide (HYDRODIURIL) 25 MG tablet TAKE ONE TABLET BY MOUTH ONE  TIME DAILY  . NASONEX 50 MCG/ACT nasal spray USE TWO SPRAYS EACH NOSTRIL ONCE DAILY.  Marland Kitchen omega-3 acid ethyl esters (LOVAZA) 1 G  capsule TAKE ONE CAPSULE BY MOUTH FOUR TIMES DAILY  . omeprazole (PRILOSEC) 20 MG capsule Take 20 mg by mouth daily. One by mouth Mon Wed Friday  . oxyCODONE-acetaminophen (PERCOCET/ROXICET) 5-325 MG per tablet Take 1-2 tablets by mouth every 4 (four) hours as needed for moderate pain or severe pain.  . rosuvastatin (CRESTOR) 40 MG tablet Take 1 tablet (40 mg total) by mouth daily. As directed  . valACYclovir (VALTREX) 1000 MG tablet Take 1 tablet by mouth daily.  . [DISCONTINUED] chlorpheniramine-HYDROcodone (TUSSIONEX PENNKINETIC ER) 10-8 MG/5ML SUER Take 5 mLs by mouth at bedtime as needed for cough.   No facility-administered encounter medications on file as of 05/17/2015.      Review of Systems  Constitutional: Negative.   HENT: Positive for congestion.   Eyes: Negative.   Respiratory: Positive for cough (worse at night).   Cardiovascular: Negative.   Gastrointestinal: Negative.   Endocrine: Negative.   Genitourinary: Negative.   Musculoskeletal: Negative.   Skin: Negative.   Allergic/Immunologic: Negative.   Neurological: Negative.   Hematological: Negative.   Psychiatric/Behavioral: Negative.        Objective:   Physical Exam  Constitutional: She is oriented to person, place, and time. She appears well-developed and well-nourished. No distress.  HENT:  Head: Normocephalic and atraumatic.  Right Ear: External ear normal.  Left Ear: External ear normal.  Nose: Nose normal.  Mouth/Throat: Oropharynx is clear and moist. No oropharyngeal exudate.  Eyes:  Conjunctivae and EOM are normal. Pupils are equal, round, and reactive to light. Left eye exhibits no discharge. No scleral icterus.  Neck: Normal range of motion. Neck supple. No thyromegaly present.  Cardiovascular: Normal rate and intact distal pulses.  Exam reveals no gallop and no friction rub.   No murmur heard. Pulmonary/Chest: Effort normal and breath sounds normal. No respiratory distress. She has no wheezes. She  has no rales.  Lymphadenopathy:    She has no cervical adenopathy.  Neurological: She is alert and oriented to person, place, and time.  Skin: Skin is warm. No rash noted.  Psychiatric: She has a normal mood and affect. Her behavior is normal. Judgment and thought content normal.  Nursing note and vitals reviewed.  BP 134/79 mmHg  Pulse 66  Temp(Src) 97.3 F (36.3 C) (Oral)  Ht 5\' 8"  (1.727 m)  Wt 189 lb (85.73 kg)  BMI 28.74 kg/m2        Assessment & Plan:  1. Left lower lobe pneumonia -Per chest x-ray this appears to be resolved and the patient is feeling much better -She will continue with lots of fluids Mucinex and her inhaler for another couple weeks - DG Chest 2 View; Future  2. Knee pain, left -She is due to have her knee surgery hopefully this week  Patient Instructions  The patient is cleared for her meniscectomy and hopefully to get this done this week She is also seen the cardiologist and gotten a clearance from him She should continue with the inhaler at least for a couple more weeks and take her Mucinex regularly during this time and drink plenty of fluids   Arrie Senate MD

## 2015-05-22 HISTORY — PX: KNEE SURGERY: SHX244

## 2015-06-02 ENCOUNTER — Other Ambulatory Visit: Payer: Self-pay | Admitting: Family Medicine

## 2015-06-06 ENCOUNTER — Ambulatory Visit: Payer: BC Managed Care – PPO | Attending: Orthopaedic Surgery | Admitting: Physical Therapy

## 2015-06-06 DIAGNOSIS — M25662 Stiffness of left knee, not elsewhere classified: Secondary | ICD-10-CM | POA: Insufficient documentation

## 2015-06-06 DIAGNOSIS — M25562 Pain in left knee: Secondary | ICD-10-CM | POA: Insufficient documentation

## 2015-06-06 NOTE — Therapy (Signed)
[facility] [place], [place], [address] Phone: [phone]   Fax:  [phone]  Physical Therapy Evaluation  Patient Details  Name: Shelly Bennett MRN: [id number] Date of Birth: Aug 03, 1951 Referring Provider:  Garald Balding, MD  Encounter Date: 06/06/2015      PT End of Session - 06/06/15 1130    Visit Number 1   Number of Visits 12   Date for PT Re-Evaluation 07/18/15   PT Start Time 0955   PT Stop Time 1037   PT Time Calculation (min) 42 min      Past Medical History  Diagnosis Date  . Arthritis   . Chronic headaches   . Adenomatous colon polyp   . CAD (coronary artery disease)   . HLD (hyperlipidemia)   . HTN (hypertension)   . Diverticulosis   . IBS (irritable bowel syndrome)   . Obesity   . External hemorrhoids   . Cystocele     Past Surgical History  Procedure Laterality Date  . Knee surgery      right  . Carpal tunnel release    . Colonoscopy  12/15/2008    diverticulosis, external hemorrhoids  . Laproscopy      There were no vitals filed for this visit.  Visit Diagnosis:  Left knee pain - Plan: PT plan of care cert/re-cert  Knee stiffness, left - Plan: PT plan of care cert/re-cert      Subjective Assessment - 06/06/15 1114    Limitations Walking   How long can you walk comfortably? 20 minutes.   Patient Stated Goals Get out of pain.   Currently in Pain? Yes   Pain Score 4             [facility] PT Assessment - 06/06/15 0001    Assessment   Medical Diagnosis Left lat/med meniscectomies.   Onset Date/Surgical Date --  05/19/15   Next MD Visit --  06/13/15.   Precautions   Precautions None   Restrictions   Weight Bearing Restrictions No   Balance Screen   Has the patient fallen in the past 6 months Yes   How many times? 3   Has the patient had a decrease in activity level because of a fear of falling?  Yes   Is the patient reluctant to leave their home because of a fear  of falling?  No   Home Ec[employer] residence   Prior Function   Level of Independence Independent   Cognition   Overall Cognitive Status Within Functional Limits for tasks assessed   Observation/Other Assessments-Edema    Edema --  No diff via circum measurements this morning.   ROM / Strength   AROM / PROM / Strength AROM;Strength   AROM   Overall AROM Comments 0 to 93 degrees.   Strength   Overall Strength Comments Left knee= 4 to 4+/5.   Palpation   Palpation comment --  Mild left knee med/lat tenderness.   Ambulation/Gait   Gait Comments Patient using a rolling walker at this time.                   [facility] Adult PT Treatment/Exercise - 06/06/15 0001    Modalities   Modalities Electrical Stimulation   Electrical Stimulation   Electrical Stimulation Location Left knee.   Electrical Stimulation Action IFC x 1 79minutes  PT Long Term Goals - 06/06/15 1137    PT LONG TERM GOAL #1   Title Ind with HEP.   Time 4   Period Weeks   Status New   PT LONG TERM GOAL #2   Title Active left knee flexion to 125 degrees to increase function.   Time 4   Period Weeks   Status New   PT LONG TERM GOAL #3   Title 5/5 left knee strength to increase stability for functional tasks.   Time 4   Period Weeks   Status New   PT LONG TERM GOAL #4   Title Walk a community distance without assistive device and pain not > 3/10.   Time 4   Period Weeks   Status New               Plan - 06/06/15 1132    Clinical Impression Statement The patient underwent a left knee lateral and medial meniscectomy surgery on 05/19/15 after three falls related to caring for her aged mother.  Her pain-level is currently a 4/10 and she is using a rolling walker at this time .   Pt will benefit from skilled therapeutic intervention in order to improve on the following deficits Pain;Decreased activity tolerance;Abnormal gait;Decreased  range of motion;Decreased strength   Rehab Potential Excellent   PT Frequency 3x / week   PT Duration 4 weeks   PT Treatment/Interventions ADLs/Self Care Home Management;Cryotherapy;Health visitor;Neuromuscular re-education;Therapeutic exercise;Therapeutic activities;Patient/family education;Passive range of motion   PT Next Visit Plan Pain-free left quadriceps strengthening.  ROM; stationary bike.   Consulted and Agree with Plan of Care Patient         Problem List Patient Active Problem List   Diagnosis Date Noted  . Vitamin D deficiency 08/19/2014  . Family history of colon cancer-father at 49+ grandparent w/ rectal cancer 01/14/2014  . Osteopenia 08/12/2013  . CAD (coronary artery disease), native coronary artery 03/22/2013  . Aortic valve stenosis, mild 03/22/2013  . Hyperlipemia 03/11/2013  . Hypertension 03/11/2013  . Allergic rhinitis 03/11/2013  . Left shoulder pain 03/11/2013  . IBS (irritable bowel syndrome) - with chronic recurrent abdominal pain 02/01/2012  . Personal history of colonic polyps - adenoma 02/01/2012  . Coccydynia 02/01/2012    Ivaan Liddy, [place] MPT 06/06/2015, 11:46 AM  [facility] [address] [place], [place], [address] Phone: [phone]   Fax:  [phone]

## 2015-06-07 ENCOUNTER — Encounter: Payer: Self-pay | Admitting: Physical Therapy

## 2015-06-07 ENCOUNTER — Ambulatory Visit: Payer: BC Managed Care – PPO | Admitting: Physical Therapy

## 2015-06-07 DIAGNOSIS — M25662 Stiffness of left knee, not elsewhere classified: Secondary | ICD-10-CM

## 2015-06-07 DIAGNOSIS — M25562 Pain in left knee: Secondary | ICD-10-CM

## 2015-06-07 NOTE — Therapy (Signed)
[facility] [place], [place], [address] Phone: [phone]   Fax:  [phone]  Physical Therapy Treatment  Patient Details  Name: Shelly Bennett MRN: [id number] Date of Birth: 11-Jul-1951 Referring Provider:  Chipper Herb, MD  Encounter Date: 06/07/2015      PT End of Session - 06/07/15 1000    Visit Number 2   Number of Visits 12   Date for PT Re-Evaluation 07/18/15   PT Start Time 0954   PT Stop Time 1044   PT Time Calculation (min) 50 min   Behavior During Therapy [facility] for tasks assessed/performed      Past Medical History  Diagnosis Date  . Arthritis   . Chronic headaches   . Adenomatous colon polyp   . CAD (coronary artery disease)   . HLD (hyperlipidemia)   . HTN (hypertension)   . Diverticulosis   . IBS (irritable bowel syndrome)   . Obesity   . External hemorrhoids   . Cystocele     Past Surgical History  Procedure Laterality Date  . Knee surgery      right  . Carpal tunnel release    . Colonoscopy  12/15/2008    diverticulosis, external hemorrhoids  . Laproscopy      There were no vitals filed for this visit.  Visit Diagnosis:  Left knee pain  Knee stiffness, left      Subjective Assessment - 06/07/15 1002    Subjective Feels good except for tightness in back of knee.    Limitations Walking   How long can you walk comfortably? 20 minutes.   Patient Stated Goals Get out of pain.   Currently in Pain? Yes   Pain Score 3    Pain Location Knee   Pain Orientation Left;Medial            [facility] PT Assessment - 06/07/15 0001    Assessment   Medical Diagnosis Left lat/med meniscectomies.   Onset Date/Surgical Date 05/19/15   Next MD Visit 06/13/2015                     [facility] Adult PT Treatment/Exercise - 06/07/15 0001    Exercises   Exercises Knee/Hip   Knee/Hip Exercises: Stretches   Active Hamstring Stretch Left;3 reps;30 seconds   Knee/Hip Exercises:  Aerobic   Nustep L4 x8 min   Knee/Hip Exercises: Supine   Short Arc Quad Sets Strengthening;Left;3 sets;10 reps   Straight Leg Raises Strengthening;Left;2 sets;10 reps   Straight Leg Raise with External Rotation Strengthening;2 sets;10 reps  Had pain in the L lateroinferior aspect of the knee   Knee/Hip Exercises: Sidelying   Hip ABduction Strengthening;Left;2 sets;10 reps   Hip ADduction Strengthening;Left;2 sets;10 reps   Knee/Hip Exercises: Prone   Hip Extension Strengthening;Left;2 sets;10 reps   Modalities   Modalities Electrical Stimulation;Cryotherapy   Cryotherapy   Number Minutes Cryotherapy 15 Minutes   Cryotherapy Location Knee   Type of Cryotherapy Other (comment)  Vasopneumatic   Electrical Stimulation   Electrical Stimulation Location Left knee.   Electrical Stimulation Action IFC   Electrical Stimulation Parameters 1-10 Hz x15 min   Electrical Stimulation Goals Pain   Manual Therapy   Manual Therapy Soft tissue mobilization;Passive ROM   Soft tissue mobilization L patellar mobs into med/lat, sup/inf/ tilts   Passive ROM L knee into flexion with gentle holds at end range  PT Education - 06/07/15 1036    Education provided Yes   Education Details HEP- SLR, abduction, adduction, extension, HS stretch in standing   Person(s) Educated Patient   Methods Explanation;Tactile cues;Verbal cues;Handout;Demonstration   Comprehension Verbalized understanding;Returned demonstration;Verbal cues required;Tactile cues required             PT Long Term Goals - 06/06/15 1137    PT LONG TERM GOAL #1   Title Ind with HEP.   Time 4   Period Weeks   Status New   PT LONG TERM GOAL #2   Title Active left knee flexion to 125 degrees to increase function.   Time 4   Period Weeks   Status New   PT LONG TERM GOAL #3   Title 5/5 left knee strength to increase stability for functional tasks.   Time 4   Period Weeks   Status New   PT LONG TERM GOAL #4    Title Walk a community distance without assistive device and pain not > 3/10.   Time 4   Period Weeks   Status New               Plan - 06/07/15 1131    Clinical Impression Statement Patient tolerated treatment well only complaining of pain during SLR with ER which was terminated at that time. Accepted strengthening HEP today in clinic without questions. Demonstrated good technique of exercises after minmal demonstration and verbal cueing. Demonstrated good L patellar mobility in med/lat, slightly diminished in sup/inf, tilts mobility. Normal modalities response noted following removal of the modalities. Experienced 2/10 pain following treatment. Ambulated into therapy gym without assistive device today.   Pt will benefit from skilled therapeutic intervention in order to improve on the following deficits Pain;Decreased activity tolerance;Abnormal gait;Decreased range of motion;Decreased strength   Rehab Potential Excellent   PT Frequency 3x / week   PT Duration 4 weeks   PT Treatment/Interventions ADLs/Self Care Home Management;Cryotherapy;Health visitor;Neuromuscular re-education;Therapeutic exercise;Therapeutic activities;Patient/family education;Passive range of motion   PT Next Visit Plan Continue per MPT POC. Initate stationary bike next treatment.   Consulted and Agree with Plan of Care Patient        Problem List Patient Active Problem List   Diagnosis Date Noted  . Vitamin D deficiency 08/19/2014  . Family history of colon cancer-father at 73+ grandparent w/ rectal cancer 01/14/2014  . Osteopenia 08/12/2013  . CAD (coronary artery disease), native coronary artery 03/22/2013  . Aortic valve stenosis, mild 03/22/2013  . Hyperlipemia 03/11/2013  . Hypertension 03/11/2013  . Allergic rhinitis 03/11/2013  . Left shoulder pain 03/11/2013  . IBS (irritable bowel syndrome) - with chronic recurrent abdominal pain 02/01/2012  . Personal history  of colonic polyps - adenoma 02/01/2012  . Coccydynia 02/01/2012    Wynelle Fanny, PTA 06/07/2015, 11:37 AM  [facility] [address] [place], [place], [address] Phone: [phone]   Fax:  [phone]

## 2015-06-07 NOTE — Patient Instructions (Signed)
Strengthening: Straight Leg Raise (Phase 1)   Tighten muscles on front of right thigh, then lift leg _4___ inches from surface, keeping knee locked.  Repeat _10___ times per set. Do _2-3___ sets per session. Do __2-3__ sessions per day.  http://orth.exer.us/614   Copyright  VHI. All rights reserved.  Strengthening: Hip Abduction (Side-Lying)   Tighten muscles on front of left thigh, then lift leg __4__ inches from surface, keeping knee locked.  Repeat __10__ times per set. Do _2-3___ sets per session. Do _2-3___ sessions per day.  http://orth.exer.us/622   Copyright  VHI. All rights reserved.  Strengthening: Hip Adduction (Side-Lying)   Tighten muscles on front of right thigh, then lift leg _4___ inches from surface, keeping knee locked.  Repeat _10___ times per set. Do _2-3___ sets per session. Do _2-3___ sessions per day.  http://orth.exer.us/624   Copyright  VHI. All rights reserved.  Strengthening: Hip Extension (Prone)   Tighten muscles on front of left thigh, then lift leg _4___ inches from surface, keeping knee locked. Repeat __10__ times per set. Do _2-3___ sets per session. Do __2-3__ sessions per day.  http://orth.exer.us/620   Copyright  VHI. All rights reserved.  Stretching: Hamstring (Standing)   Place right foot on stool. Slowly lean forward, keeping back straight, until stretch is felt in back of thigh. Hold __30__ seconds. Repeat _3___ times per set. Do _1___ sets per session. Do __2-3__ sessions per day.  http://orth.exer.us/658   Copyright  VHI. All rights reserved.

## 2015-06-10 ENCOUNTER — Ambulatory Visit (INDEPENDENT_AMBULATORY_CARE_PROVIDER_SITE_OTHER): Payer: BC Managed Care – PPO | Admitting: Family Medicine

## 2015-06-10 ENCOUNTER — Encounter: Payer: Self-pay | Admitting: Family Medicine

## 2015-06-10 VITALS — BP 136/74 | HR 67 | Temp 98.1°F | Ht 68.0 in | Wt 189.0 lb

## 2015-06-10 DIAGNOSIS — K219 Gastro-esophageal reflux disease without esophagitis: Secondary | ICD-10-CM

## 2015-06-10 DIAGNOSIS — I25119 Atherosclerotic heart disease of native coronary artery with unspecified angina pectoris: Secondary | ICD-10-CM

## 2015-06-10 DIAGNOSIS — J209 Acute bronchitis, unspecified: Secondary | ICD-10-CM

## 2015-06-10 DIAGNOSIS — I10 Essential (primary) hypertension: Secondary | ICD-10-CM

## 2015-06-10 DIAGNOSIS — R059 Cough, unspecified: Secondary | ICD-10-CM

## 2015-06-10 DIAGNOSIS — R05 Cough: Secondary | ICD-10-CM

## 2015-06-10 DIAGNOSIS — I350 Nonrheumatic aortic (valve) stenosis: Secondary | ICD-10-CM

## 2015-06-10 DIAGNOSIS — E785 Hyperlipidemia, unspecified: Secondary | ICD-10-CM | POA: Diagnosis not present

## 2015-06-10 DIAGNOSIS — J40 Bronchitis, not specified as acute or chronic: Secondary | ICD-10-CM

## 2015-06-10 DIAGNOSIS — E559 Vitamin D deficiency, unspecified: Secondary | ICD-10-CM | POA: Diagnosis not present

## 2015-06-10 LAB — POCT CBC
Granulocyte percent: 73.4 %G (ref 37–80)
HCT, POC: 41.9 % (ref 37.7–47.9)
Hemoglobin: 13.4 g/dL (ref 12.2–16.2)
LYMPH, POC: 1.3 (ref 0.6–3.4)
MCH, POC: 30.6 pg (ref 27–31.2)
MCHC: 32.1 g/dL (ref 31.8–35.4)
MCV: 95.3 fL (ref 80–97)
MPV: 7.5 fL (ref 0–99.8)
PLATELET COUNT, POC: 244 10*3/uL (ref 142–424)
POC Granulocyte: 4.3 (ref 2–6.9)
POC LYMPH PERCENT: 22 %L (ref 10–50)
RBC: 4.39 M/uL (ref 4.04–5.48)
RDW, POC: 14.1 %
WBC: 5.8 10*3/uL (ref 4.6–10.2)

## 2015-06-10 MED ORDER — METHYLPREDNISOLONE ACETATE 80 MG/ML IJ SUSP
60.0000 mg | Freq: Once | INTRAMUSCULAR | Status: AC
  Administered 2015-06-10: 60 mg via INTRAMUSCULAR

## 2015-06-10 MED ORDER — PREDNISONE 10 MG PO TABS: ORAL_TABLET | ORAL | Status: DC

## 2015-06-10 NOTE — Patient Instructions (Addendum)
Continue current medications. Continue good therapeutic lifestyle changes which include good diet and exercise. Fall precautions discussed with patient. If an FOBT was given today- please return it to our front desk. If you are over 65 years old - you may need Prevnar 61 or the adult Pneumonia vaccine.  Flu Shots are still available at our office. If you still haven't had one please call to set up a nurse visit to get one.   After your visit with [place] today you will receive a survey in the mail or online from [employer] regarding your care with [place]. Please take a moment to fill this out. Your feedback is very important to [place] as you can help [place] better understand your patient needs as well as improve your experience and satisfaction. WE CARE ABOUT YOU!!!   The patient should continue with the physical therapy as recommended by the orthopedic surgeon She should continue with Mucinex twice daily with a large glass of water She should continue with Flonase nasal inhaler regularly and nasal saline during the day She should check her blood pressures at home and bring these readings to the next visit She should take the prednisone as directed and continue to use the inhaler for her lungs. We will call her with the lab work results as soon as they become available She should continue to drink plenty of fluids

## 2015-06-10 NOTE — Progress Notes (Signed)
Subjective:    Patient ID: Shelly Bennett, female    DOB: Jan 06, 1951, 64 y.o.   MRN: [id number]  HPI Pt here for follow up and management of chronic medical problems which includes hypertension and hyperlipidemia. She is taking medications regularly. On 05/19/2015 she had a left medial and lateral meniscectomy. She is currently getting physical therapy for this. She is also developing a cough. It is important to note that she was treated for pneumonia 6 weeks ago. A repeat chest x-ray showed [place] clearing of this on 05/17/2015. The patient has been through a lot recently with pneumonia knee surgery a son who is had a damaged spine and an elderly mother that she has to help and overseeing her care. She was somewhat tearful in talking about all the stresses. She has seen the cardiologist recently and he will see her again in one year. She denies chest pain or shortness of breath and has had no problems with her GI tract or urinary tract. She continues to recover from her knee surgery with physical therapy and has a planned appointment with orthopedist soon.       Patient Active Problem List   Diagnosis Date Noted  . Vitamin D deficiency 08/19/2014  . Family history of colon cancer-father at 40+ grandparent w/ rectal cancer 01/14/2014  . Osteopenia 08/12/2013  . CAD (coronary artery disease), native coronary artery 03/22/2013  . Aortic valve stenosis, mild 03/22/2013  . Hyperlipemia 03/11/2013  . Hypertension 03/11/2013  . Allergic rhinitis 03/11/2013  . Left shoulder pain 03/11/2013  . IBS (irritable bowel syndrome) - with chronic recurrent abdominal pain 02/01/2012  . Personal history of colonic polyps - adenoma 02/01/2012  . Coccydynia 02/01/2012   Outpatient Encounter Prescriptions as of 06/10/2015  Medication Sig  . amLODipine (NORVASC) 10 MG tablet TAKE ONE TABLET BY MOUTH ONE  TIME DAILY  . aspirin 81 MG tablet Take 81 mg by mouth daily.   . calcium carbonate 200 MG  capsule Take 250 mg by mouth 2 (two) times daily with a meal.  . Cholecalciferol (VITAMIN D-3) 5000 UNITS TABS Take 1 capsule by mouth daily. Mon thru Friday  . desonide (DESOWEN) 0.05 % cream Apply 1 application topically as needed (face irritation).   . fexofenadine (ALLEGRA) 180 MG tablet Take 180 mg by mouth daily.  . Fluticasone Furoate-Vilanterol (BREO ELLIPTA) 100-25 MCG/INH AEPB Inhale 1 puff into the lungs daily.  Marland Kitchen guaiFENesin (MUCINEX) 600 MG 12 hr tablet Take 1,200 mg by mouth. As needed  . hydrochlorothiazide (HYDRODIURIL) 25 MG tablet TAKE ONE TABLET BY MOUTH ONE  TIME DAILY  . HYDROcodone-acetaminophen (NORCO/VICODIN) 5-325 MG per tablet   . mometasone (NASONEX) 50 MCG/ACT nasal spray USE TWO SPRAYS EACH NOSTRIL ONCE DAILY.  Marland Kitchen omega-3 acid ethyl esters (LOVAZA) 1 G capsule TAKE ONE CAPSULE BY MOUTH FOUR TIMES DAILY  . omeprazole (PRILOSEC) 20 MG capsule Take 20 mg by mouth daily. One by mouth Mon Wed Friday  . ondansetron (ZOFRAN) 4 MG tablet   . rosuvastatin (CRESTOR) 40 MG tablet Take one po qd  . valACYclovir (VALTREX) 1000 MG tablet Take 1 tablet by mouth daily.  . [DISCONTINUED] oxyCODONE-acetaminophen (PERCOCET/ROXICET) 5-325 MG per tablet Take 1-2 tablets by mouth every 4 (four) hours as needed for moderate pain or severe pain. (Patient not taking: Reported on 06/06/2015)   No facility-administered encounter medications on file as of 06/10/2015.     Review of Systems  Constitutional: Negative.   HENT: Negative.   Eyes:  Negative.   Respiratory: Positive for cough (seems to be returning).   Cardiovascular: Negative.   Gastrointestinal: Negative.   Endocrine: Negative.   Genitourinary: Negative.   Musculoskeletal: Positive for arthralgias (left foot and right wrist pain).  Skin: Negative.   Allergic/Immunologic: Negative.   Neurological: Negative.   Hematological: Negative.   Psychiatric/Behavioral: Negative.        Objective:   Physical Exam  Constitutional:  She is oriented to person, place, and time. She appears well-developed and well-nourished. She appears distressed.  HENT:  Head: Normocephalic and atraumatic.  Right Ear: External ear normal.  Left Ear: External ear normal.  Nose: Nose normal.  Mouth/Throat: Oropharynx is clear and moist.  Eyes: Conjunctivae and EOM are normal. Pupils are equal, round, and reactive to light. Right eye exhibits no discharge. Left eye exhibits no discharge. No scleral icterus.  Neck: Normal range of motion. Neck supple. No thyromegaly present.  No adenopathy  Cardiovascular: Normal rate, regular rhythm and intact distal pulses.  Exam reveals no gallop and no friction rub.   Murmur heard. Pulmonary/Chest: Effort normal and breath sounds normal. No respiratory distress. She has no wheezes. She has no rales. She exhibits no tenderness.  She has a dry nonproductive cough  Abdominal: Soft. Bowel sounds are normal. She exhibits no mass. There is no tenderness. There is no rebound and no guarding.  Musculoskeletal: She exhibits no edema or tenderness.  The left knee is somewhat stiff and tender but with minimal swelling and no erythema  Lymphadenopathy:    She has no cervical adenopathy.  Neurological: She is alert and oriented to person, place, and time. She has normal reflexes. No cranial nerve deficit.  Skin: Skin is warm and dry. No rash noted.  Psychiatric: She has a normal mood and affect. Her behavior is normal. Judgment and thought content normal.  Nursing note and vitals reviewed.  BP 136/74 mmHg  Pulse 67  Temp(Src) 98.1 F (36.7 C) (Oral)  Ht 5' 8" (1.727 m)  Wt 189 lb (85.73 kg)  BMI 28.74 kg/m2        Assessment & Plan:  1. Vitamin D deficiency -Continue with current treatment pending results of lab work - POCT CBC - Vit D  25 hydroxy (rtn osteoporosis monitoring)  2. Hypertension -The blood pressure is good today and she should continue with her current treatment and sodium  restriction - POCT CBC - BMP8+EGFR - Hepatic function panel  3. Hyperlipidemia -Continue with aggressive therapeutic lifestyle changes and current treatment - POCT CBC - NMR, lipoprofile  4. Gastroesophageal reflux disease, esophagitis presence not specified -She has no complaints or guarding this and she should continue with her omeprazole - POCT CBC - Hepatic function panel  5. Coronary artery disease involving native coronary artery of native heart with angina pectoris -Follow-up with cardiology as planned-Dr. Burt Knack - POCT CBC - BMP8+EGFR - Hepatic function panel - NMR, lipoprofile  6. Aortic valve stenosis, mild -Follow-up with Dr. Burt Knack as planned - POCT CBC - NMR, lipoprofile  7. Bronchitis with bronchospasm -I believe most of this is allergy related and she will continue with her cough medicine and do a short course of prednisone to see if this will help her situation - predniSONE (DELTASONE) 10 MG tablet; 1 tablet 4 times a day for 2 days,  1 tablet 3 times a day for 2 days,  1 tablet 2 times a day for 2 days, 1 tablet daily for 2 days  Dispense: 20 tablet; Refill:  0 - methylPREDNISolone acetate (DEPO-MEDROL) injection 60 mg; Inject 0.75 mLs (60 mg total) into the muscle once.  8. Cough - predniSONE (DELTASONE) 10 MG tablet; 1 tablet 4 times a day for 2 days,  1 tablet 3 times a day for 2 days,  1 tablet 2 times a day for 2 days, 1 tablet daily for 2 days  Dispense: 20 tablet; Refill: 0  Patient Instructions  Continue current medications. Continue good therapeutic lifestyle changes which include good diet and exercise. Fall precautions discussed with patient. If an FOBT was given today- please return it to our front desk. If you are over 49 years old - you may need Prevnar 7 or the adult Pneumonia vaccine.  Flu Shots are still available at our office. If you still haven't had one please call to set up a nurse visit to get one.   After your visit with [place]  today you will receive a survey in the mail or online from [employer] regarding your care with [place]. Please take a moment to fill this out. Your feedback is very important to [place] as you can help [place] better understand your patient needs as well as improve your experience and satisfaction. WE CARE ABOUT YOU!!!   The patient should continue with the physical therapy as recommended by the orthopedic surgeon She should continue with Mucinex twice daily with a large glass of water She should continue with Flonase nasal inhaler regularly and nasal saline during the day She should check her blood pressures at home and bring these readings to the next visit She should take the prednisone as directed and continue to use the inhaler for her lungs. We will call her with the lab work results as soon as they become available She should continue to drink plenty of fluids   Arrie Senate MD

## 2015-06-11 LAB — BMP8+EGFR
BUN/Creatinine Ratio: 18 (ref 11–26)
BUN: 12 mg/dL (ref 8–27)
CO2: 24 mmol/L (ref 18–29)
Calcium: 10 mg/dL (ref 8.7–10.3)
Chloride: 103 mmol/L (ref 97–108)
Creatinine, Ser: 0.67 mg/dL (ref 0.57–1.00)
GFR, EST AFRICAN AMERICAN: 107 mL/min/{1.73_m2} (ref >59)
GFR, EST NON AFRICAN AMERICAN: 93 mL/min/{1.73_m2} (ref >59)
Glucose: 94 mg/dL (ref 65–99)
Potassium: 4.2 mmol/L (ref 3.5–5.2)
SODIUM: 143 mmol/L (ref 134–144)

## 2015-06-11 LAB — HEPATIC FUNCTION PANEL
ALBUMIN: 4.3 g/dL (ref 3.6–4.8)
ALT: 25 IU/L (ref 0–32)
AST: 28 IU/L (ref 0–40)
Alkaline Phosphatase: 72 IU/L (ref 39–117)
BILIRUBIN TOTAL: 0.5 mg/dL (ref 0.0–1.2)
BILIRUBIN, DIRECT: 0.14 mg/dL (ref 0.00–0.40)
Total Protein: 6.6 g/dL (ref 6.0–8.5)

## 2015-06-11 LAB — NMR, LIPOPROFILE
Cholesterol: 166 mg/dL (ref 100–199)
HDL CHOLESTEROL BY NMR: 69 mg/dL (ref >39)
HDL PARTICLE NUMBER: 41.9 umol/L (ref >=30.5)
LDL Particle Number: 840 nmol/L (ref <1000)
LDL Size: 20.8 nm (ref >20.5)
LDL-C: 77 mg/dL (ref 0–99)
Small LDL Particle Number: 200 nmol/L (ref <=527)
TRIGLYCERIDES BY NMR: 102 mg/dL (ref 0–149)

## 2015-06-11 LAB — VITAMIN D 25 HYDROXY (VIT D DEFICIENCY, FRACTURES): VIT D 25 HYDROXY: 56.1 ng/mL (ref 30.0–100.0)

## 2015-06-13 ENCOUNTER — Ambulatory Visit: Payer: BC Managed Care – PPO | Admitting: Physical Therapy

## 2015-06-13 ENCOUNTER — Telehealth: Payer: Self-pay | Admitting: *Deleted

## 2015-06-13 DIAGNOSIS — M25662 Stiffness of left knee, not elsewhere classified: Secondary | ICD-10-CM

## 2015-06-13 DIAGNOSIS — M25562 Pain in left knee: Secondary | ICD-10-CM

## 2015-06-13 NOTE — Therapy (Signed)
[facility] [place], [place], [address] Phone: [phone]   Fax:  [phone]  Physical Therapy Treatment  Patient Details  Name: Shelly Bennett MRN: [id number] Date of Birth: October 26, 1951 Referring Provider:  Chipper Herb, MD  Encounter Date: 06/13/2015      PT End of Session - 06/13/15 1519    Visit Number 3   Number of Visits 12   Date for PT Re-Evaluation 07/18/15   PT Start Time 2458   PT Stop Time 1623   PT Time Calculation (min) 66 min   Activity Tolerance Patient tolerated treatment well      Past Medical History  Diagnosis Date  . Arthritis   . Chronic headaches   . Adenomatous colon polyp   . CAD (coronary artery disease)   . HLD (hyperlipidemia)   . HTN (hypertension)   . Diverticulosis   . IBS (irritable bowel syndrome)   . Obesity   . External hemorrhoids   . Cystocele     Past Surgical History  Procedure Laterality Date  . Knee surgery      right  . Carpal tunnel release    . Colonoscopy  12/15/2008    diverticulosis, external hemorrhoids  . Laproscopy      There were no vitals filed for this visit.  Visit Diagnosis:  Knee stiffness, left  Left knee pain      Subjective Assessment - 06/13/15 1520    Subjective My knees doing pretty good. I've been walking on it a lot. It just gets stiff.   Currently in Pain? No/denies                         [facility] Adult PT Treatment/Exercise - 06/13/15 0001    Knee/Hip Exercises: Aerobic   Nustep L4 x8 min   Knee/Hip Exercises: Machines for Strengthening   Cybex Leg Press 1 plate 2 x 10   Knee/Hip Exercises: Standing   Terminal Knee Extension Strengthening;Left;2 sets;10 reps   Theraband Level (Terminal Knee Extension) Level 2 (Red)  3 sec   Knee/Hip Exercises: Supine   Short Arc Quad Sets Strengthening;Left;3 sets;10 reps  5 sec hold   Heel Slides AROM;Left;1 set;10 reps  painful at end.   Straight Leg Raises  Strengthening;Left;3 sets;10 reps   Straight Leg Raise with External Rotation Strengthening;2 sets;10 reps   Knee/Hip Exercises: Sidelying   Hip ABduction Strengthening;Left;3 sets;10 reps   Hip ADduction Strengthening;Left;3 sets;10 reps   Knee/Hip Exercises: Prone   Hamstring Curl 1 set;10 reps  pain free range   Hip Extension Strengthening;Left;3 sets;10 reps   Modalities   Modalities Electrical Stimulation   Cryotherapy   Type of Cryotherapy --  vaso error code so no charge   Acupuncturist Stimulation Location Left knee.   Electrical Stimulation Action IFC   Electrical Stimulation Parameters to tolerance   Electrical Stimulation Goals Pain                     PT Long Term Goals - 06/06/15 1137    PT LONG TERM GOAL #1   Title Ind with HEP.   Time 4   Period Weeks   Status New   PT LONG TERM GOAL #2   Title Active left knee flexion to 125 degrees to increase function.   Time 4   Period Weeks   Status New   PT LONG TERM GOAL #3  Title 5/5 left knee strength to increase stability for functional tasks.   Time 4   Period Weeks   Status New   PT LONG TERM GOAL #4   Title Walk a community distance without assistive device and pain not > 3/10.   Time 4   Period Weeks   Status New               Plan - 06/13/15 1543    Clinical Impression Statement Patient tolerated increased exercise today without increase in knee pain. Some discomfort with heel slides so limited to 10 reps. Required verbal and tactile cues for correct form with sidelyiing hip ABD. Patient reports no limitations with walking. She only uses walker for uneven ground. Measured 109 degrees active flexion today.   PT Next Visit Plan Assess goals for MD note for appt Wed. continue per POC,  try stationary bike.        Problem List Patient Active Problem List   Diagnosis Date Noted  . Vitamin D deficiency 08/19/2014  . Family history of colon cancer-father at 43+  grandparent w/ rectal cancer 01/14/2014  . Osteopenia 08/12/2013  . CAD (coronary artery disease), native coronary artery 03/22/2013  . Aortic valve stenosis, mild 03/22/2013  . Hyperlipemia 03/11/2013  . Hypertension 03/11/2013  . Allergic rhinitis 03/11/2013  . Left shoulder pain 03/11/2013  . IBS (irritable bowel syndrome) - with chronic recurrent abdominal pain 02/01/2012  . Personal history of colonic polyps - adenoma 02/01/2012  . Coccydynia 02/01/2012    Madelyn Flavors PT  06/13/2015, [facility] [address] [place], [place], [address] Phone: [phone]   Fax:  [phone]

## 2015-06-13 NOTE — Progress Notes (Signed)
Patient aware.

## 2015-06-13 NOTE — Telephone Encounter (Signed)
-----   Message from Chipper Herb, MD sent at 06/11/2015  8:07 AM EDT ----- The blood sugar is good at 94. The creatinine, the most important kidney function test is within normal limits. The electrolytes including potassium are good. All liver function tests are within normal limits Shelly Bennett all numbers with advanced lipid testing are excellent and at goal and the patient should continue with her current treatment and increase her exercise regimen as tolerated after knee surgery. The vitamin D level is also excellent at 56.1 and she should continue with current treatment

## 2015-06-14 ENCOUNTER — Encounter: Payer: Self-pay | Admitting: Physical Therapy

## 2015-06-14 ENCOUNTER — Ambulatory Visit: Payer: BC Managed Care – PPO | Admitting: Physical Therapy

## 2015-06-14 DIAGNOSIS — M25662 Stiffness of left knee, not elsewhere classified: Secondary | ICD-10-CM

## 2015-06-14 DIAGNOSIS — M25562 Pain in left knee: Secondary | ICD-10-CM

## 2015-06-14 NOTE — Therapy (Signed)
[facility] [place], [place], [address] Phone: [phone]   Fax:  [phone]  Physical Therapy Treatment  Patient Details  Name: Shelly Bennett MRN: [id number] Date of Birth: 03-25-1951 Referring Provider:  Chipper Herb, MD  Encounter Date: 06/14/2015      PT End of Session - 06/14/15 1436    Visit Number 4   Number of Visits 12   Date for PT Re-Evaluation 07/18/15   PT Start Time 1430   PT Stop Time 1521   PT Time Calculation (min) 51 min   Activity Tolerance Patient tolerated treatment well   Behavior During Therapy [facility] for tasks assessed/performed      Past Medical History  Diagnosis Date  . Arthritis   . Chronic headaches   . Adenomatous colon polyp   . CAD (coronary artery disease)   . HLD (hyperlipidemia)   . HTN (hypertension)   . Diverticulosis   . IBS (irritable bowel syndrome)   . Obesity   . External hemorrhoids   . Cystocele     Past Surgical History  Procedure Laterality Date  . Knee surgery      right  . Carpal tunnel release    . Colonoscopy  12/15/2008    diverticulosis, external hemorrhoids  . Laproscopy      There were no vitals filed for this visit.  Visit Diagnosis:  Left knee pain  Knee stiffness, left      Subjective Assessment - 06/14/15 1435    Subjective States that knee is sore following being on her feet all day long and having therapy session yesterday afternoon. Had to take pain medication last night. Took first standing shower today. Stated that she almost fell in driveway today when ambulating without assistive device   Limitations Walking   How long can you walk comfortably? 20 minutes.   Patient Stated Goals Get out of pain.   Currently in Pain? Yes   Pain Score 3    Pain Location Knee   Pain Orientation Left   Pain Descriptors / Indicators Sore            [facility] PT Assessment - 06/14/15 0001    Assessment   Medical Diagnosis Left lat/med  meniscectomies.   Onset Date/Surgical Date 05/19/15   Next MD Visit 06/13/2015   ROM / Strength   AROM / PROM / Strength AROM   AROM   Overall AROM  Deficits   AROM Assessment Site Knee   Right/Left Knee Left   Left Knee Extension 0   Left Knee Flexion 109                     [facility] Adult PT Treatment/Exercise - 06/14/15 0001    Knee/Hip Exercises: Stretches   Active Hamstring Stretch Left;3 reps;30 seconds   Knee/Hip Exercises: Aerobic   Nustep L4 x8 min   Knee/Hip Exercises: Machines for Strengthening   Cybex Leg Press 1 plate 2 x 10   Knee/Hip Exercises: Standing   Terminal Knee Extension Strengthening;Left;2 sets;10 reps;Other (comment)  Pink XTS with towel   Rocker Board 3 minutes;Other (comment)  using inverted BOSU   Knee/Hip Exercises: Supine   Straight Leg Raises Strengthening;Left;3 sets;10 reps   Straight Leg Raise with External Rotation Strengthening;Left;2 sets;10 reps   Knee/Hip Exercises: Sidelying   Hip ABduction Strengthening;Left;3 sets;10 reps   Knee/Hip Exercises: Prone   Hip Extension Strengthening;Left;3 sets;10 reps   Modalities   Modalities Electrical Stimulation;Cryotherapy  Cryotherapy   Number Minutes Cryotherapy 15 Minutes   Cryotherapy Location Knee   Type of Cryotherapy Other (comment)  vasopneumatic   Electrical Stimulation   Electrical Stimulation Location Left knee.   Electrical Stimulation Action IFC   Electrical Stimulation Parameters 1-10 Hz x15 min                     PT Long Term Goals - 06/14/15 1507    PT LONG TERM GOAL #1   Title Ind with HEP.   Time 4   Period Weeks   Status Achieved   PT LONG TERM GOAL #2   Title Active left knee flexion to 125 degrees to increase function.   Time 4   Period Weeks   Status On-going   PT LONG TERM GOAL #3   Title 5/5 left knee strength to increase stability for functional tasks.   Time 4   Period Weeks   Status On-going   PT LONG TERM GOAL #4   Title Walk  a community distance without assistive device and pain not > 3/10.   Time 4   Period Weeks   Status On-going               Plan - 06/14/15 1509    Clinical Impression Statement Patient tolerated treatment very well today without complaint of pain during exercises. Experienced weak feeling in L knee during leg press per patient report. AROM of the L knee improved to 0-109 deg. Achieved HEP goal today; all other goals are on-going secondary to decreased L knee ROM and strength, and pain rating rising about 3/10 occasionally during ambulation without assistive device.  Patient ambulated into therapy gym without assistive device and has not been using assistive device in 5-6 days but only uses assistive device on uneven ground or unsteady enviroment. Demonstrated a 5 deg extensor lag during LLE SLR. Continues to have 3-4/10 pain due to weakness during LLE SLR with ER. Normal modalties response noted following removal of the modalities. Denied pain following treatment only dizzy sensation but denied needing seated break to gain steady feeling.   Pt will benefit from skilled therapeutic intervention in order to improve on the following deficits Pain;Decreased activity tolerance;Abnormal gait;Decreased range of motion;Decreased strength   Rehab Potential Excellent   PT Frequency 3x / week   PT Duration 4 weeks   PT Treatment/Interventions ADLs/Self Care Home Management;Cryotherapy;Health visitor;Neuromuscular re-education;Therapeutic exercise;Therapeutic activities;Patient/family education;Passive range of motion   PT Next Visit Plan Continue per MPT POC. Route MD note to Dr. Durward Fortes tomorrow 06/15/2015   Consulted and Agree with Plan of Care Patient        Problem List Patient Active Problem List   Diagnosis Date Noted  . Vitamin D deficiency 08/19/2014  . Family history of colon cancer-father at 22+ grandparent w/ rectal cancer 01/14/2014  . Osteopenia  08/12/2013  . CAD (coronary artery disease), native coronary artery 03/22/2013  . Aortic valve stenosis, mild 03/22/2013  . Hyperlipemia 03/11/2013  . Hypertension 03/11/2013  . Allergic rhinitis 03/11/2013  . Left shoulder pain 03/11/2013  . IBS (irritable bowel syndrome) - with chronic recurrent abdominal pain 02/01/2012  . Personal history of colonic polyps - adenoma 02/01/2012  . Coccydynia 02/01/2012    Ahmed Prima, PTA 06/14/2015 3:32 PM  [facility] [address] [place], [place], [address] Phone: [phone]   Fax:  [phone]

## 2015-06-15 ENCOUNTER — Ambulatory Visit: Payer: BC Managed Care – PPO | Admitting: Physical Therapy

## 2015-06-15 ENCOUNTER — Encounter: Payer: Self-pay | Admitting: Physical Therapy

## 2015-06-15 DIAGNOSIS — M25562 Pain in left knee: Secondary | ICD-10-CM

## 2015-06-15 DIAGNOSIS — M25662 Stiffness of left knee, not elsewhere classified: Secondary | ICD-10-CM

## 2015-06-15 NOTE — Therapy (Signed)
[facility] [place], [place], [address] Phone: [phone]   Fax:  [phone]  Physical Therapy Treatment  Patient Details  Name: Shelly Bennett MRN: [id number] Date of Birth: 10/28/1951 Referring Provider:  Chipper Herb, MD  Encounter Date: 06/15/2015      PT End of Session - 06/15/15 1238    Visit Number 5   Number of Visits 12   Date for PT Re-Evaluation 07/18/15   PT Start Time 1200   PT Stop Time 1248   PT Time Calculation (min) 48 min   Activity Tolerance Patient tolerated treatment well   Behavior During Therapy [facility] for tasks assessed/performed      Past Medical History  Diagnosis Date  . Arthritis   . Chronic headaches   . Adenomatous colon polyp   . CAD (coronary artery disease)   . HLD (hyperlipidemia)   . HTN (hypertension)   . Diverticulosis   . IBS (irritable bowel syndrome)   . Obesity   . External hemorrhoids   . Cystocele     Past Surgical History  Procedure Laterality Date  . Knee surgery      right  . Carpal tunnel release    . Colonoscopy  12/15/2008    diverticulosis, external hemorrhoids  . Laproscopy      There were no vitals filed for this visit.  Visit Diagnosis:  Left knee pain  Knee stiffness, left      Subjective Assessment - 06/15/15 1204    Subjective Patient went to MD this morning and reported knee is doing good and to continue remaining visits for therapy, patient may require knee brace per MD.   Limitations Walking   How long can you walk comfortably? 20 minutes.   Patient Stated Goals Get out of pain.   Currently in Pain? Yes   Pain Score 3    Pain Location Knee   Pain Orientation Left   Pain Descriptors / Indicators Sore   Pain Type Surgical pain   Pain Onset 1 to 4 weeks ago   Aggravating Factors  increased activity   Pain Relieving Factors rest            [facility] PT Assessment - 06/15/15 0001    AROM   Overall AROM  Deficits   AROM  Assessment Site Knee   Right/Left Knee Left   Left Knee Extension 0   Left Knee Flexion 103                     [facility] Adult PT Treatment/Exercise - 06/15/15 0001    Knee/Hip Exercises: Stretches   Active Hamstring Stretch Left;3 reps;30 seconds   Knee/Hip Exercises: Aerobic   Nustep L4 x 10 min   Knee/Hip Exercises: Machines for Strengthening   Cybex Leg Press 1 plate 2 x 10   Knee/Hip Exercises: Standing   Forward Step Up Left;10 reps;Step Height: 6";3 sets   Rocker Board 3 minutes   Other Standing Knee Exercises TKE with Pink XTS x30   Cryotherapy   Number Minutes Cryotherapy 15 Minutes   Cryotherapy Location Knee   Type of Cryotherapy --  vasopnumatic   Electrical Stimulation   Electrical Stimulation Location Left knee.   Electrical Stimulation Action IFC   Electrical Stimulation Parameters 1-10hz    Electrical Stimulation Goals Pain                     PT Long Term Goals - 06/14/15 1507  PT LONG TERM GOAL #1   Title Ind with HEP.   Time 4   Period Weeks   Status Achieved   PT LONG TERM GOAL #2   Title Active left knee flexion to 125 degrees to increase function.   Time 4   Period Weeks   Status On-going   PT LONG TERM GOAL #3   Title 5/5 left knee strength to increase stability for functional tasks.   Time 4   Period Weeks   Status On-going   PT LONG TERM GOAL #4   Title Walk a community distance without assistive device and pain not > 3/10.   Time 4   Period Weeks   Status On-going               Plan - 06/15/15 1243    Clinical Impression Statement Patient tolerated treatment well today. Has not improved with AROM in left knee today due to tightness. Continued with pain free quad strengthening today with good progresion. No further goals met due to pain 3-4/10 with community distance , strength, and ROM deficits.   Pt will benefit from skilled therapeutic intervention in order to improve on the following deficits  Pain;Decreased activity tolerance;Abnormal gait;Decreased range of motion;Decreased strength   Rehab Potential Excellent   PT Frequency 3x / week   PT Duration 4 weeks   PT Treatment/Interventions ADLs/Self Care Home Management;Cryotherapy;Health visitor;Neuromuscular re-education;Therapeutic exercise;Therapeutic activities;Patient/family education;Passive range of motion   PT Next Visit Plan Continue per MPT POC (MD Durward Fortes 07/13/15)   Consulted and Agree with Plan of Care Patient        Problem List Patient Active Problem List   Diagnosis Date Noted  . Vitamin D deficiency 08/19/2014  . Family history of colon cancer-father at 49+ grandparent w/ rectal cancer 01/14/2014  . Osteopenia 08/12/2013  . CAD (coronary artery disease), native coronary artery 03/22/2013  . Aortic valve stenosis, mild 03/22/2013  . Hyperlipemia 03/11/2013  . Hypertension 03/11/2013  . Allergic rhinitis 03/11/2013  . Left shoulder pain 03/11/2013  . IBS (irritable bowel syndrome) - with chronic recurrent abdominal pain 02/01/2012  . Personal history of colonic polyps - adenoma 02/01/2012  . Coccydynia 02/01/2012    Jovani Colquhoun P, PTA 06/15/2015, 12:51 PM  [facility] [address] [place], [place], [address] Phone: [phone]   Fax:  [phone]

## 2015-06-21 ENCOUNTER — Encounter: Payer: BC Managed Care – PPO | Admitting: Physical Therapy

## 2015-06-21 ENCOUNTER — Telehealth: Payer: Self-pay | Admitting: Nurse Practitioner

## 2015-06-21 ENCOUNTER — Ambulatory Visit (INDEPENDENT_AMBULATORY_CARE_PROVIDER_SITE_OTHER): Payer: BC Managed Care – PPO | Admitting: Family Medicine

## 2015-06-21 ENCOUNTER — Ambulatory Visit (INDEPENDENT_AMBULATORY_CARE_PROVIDER_SITE_OTHER): Payer: BC Managed Care – PPO

## 2015-06-21 ENCOUNTER — Encounter: Payer: Self-pay | Admitting: Family Medicine

## 2015-06-21 VITALS — BP 124/78 | HR 94 | Temp 98.8°F | Ht 68.0 in | Wt 184.0 lb

## 2015-06-21 DIAGNOSIS — R109 Unspecified abdominal pain: Secondary | ICD-10-CM

## 2015-06-21 DIAGNOSIS — R059 Cough, unspecified: Secondary | ICD-10-CM

## 2015-06-21 DIAGNOSIS — J181 Lobar pneumonia, unspecified organism: Principal | ICD-10-CM

## 2015-06-21 DIAGNOSIS — R509 Fever, unspecified: Secondary | ICD-10-CM

## 2015-06-21 DIAGNOSIS — R05 Cough: Secondary | ICD-10-CM

## 2015-06-21 DIAGNOSIS — J189 Pneumonia, unspecified organism: Secondary | ICD-10-CM

## 2015-06-21 LAB — POCT CBC
Granulocyte percent: 85.2 %G — AB (ref 37–80)
HCT, POC: 40.3 % (ref 37.7–47.9)
Hemoglobin: 12.5 g/dL (ref 12.2–16.2)
LYMPH, POC: 2.3 (ref 0.6–3.4)
MCH, POC: 28.9 pg (ref 27–31.2)
MCHC: 31 g/dL — AB (ref 31.8–35.4)
MCV: 93.2 fL (ref 80–97)
MPV: 7.7 fL (ref 0–99.8)
POC GRANULOCYTE: 15.8 — AB (ref 2–6.9)
POC LYMPH PERCENT: 12.3 %L (ref 10–50)
Platelet Count, POC: 330 10*3/uL (ref 142–424)
RBC: 4.33 M/uL (ref 4.04–5.48)
RDW, POC: 14.1 %
WBC: 18.5 10*3/uL — AB (ref 4.6–10.2)

## 2015-06-21 LAB — POCT URINALYSIS DIPSTICK
Bilirubin, UA: NEGATIVE
Glucose, UA: NEGATIVE
Ketones, UA: NEGATIVE
Nitrite, UA: NEGATIVE
RBC UA: NEGATIVE
SPEC GRAV UA: 1.01
UROBILINOGEN UA: NEGATIVE
pH, UA: 8

## 2015-06-21 LAB — POCT UA - MICROSCOPIC ONLY
BACTERIA, U MICROSCOPIC: NEGATIVE
CRYSTALS, UR, HPF, POC: NEGATIVE
Casts, Ur, LPF, POC: NEGATIVE
Mucus, UA: NEGATIVE
RBC, urine, microscopic: NEGATIVE

## 2015-06-21 LAB — POCT INFLUENZA A/B
INFLUENZA A, POC: NEGATIVE
Influenza B, POC: NEGATIVE

## 2015-06-21 MED ORDER — LEVOFLOXACIN 500 MG PO TABS: 500.0000 mg | ORAL_TABLET | Freq: Every day | ORAL | Status: DC

## 2015-06-21 MED ORDER — HYDROCOD POLST-CPM POLST ER 10-8 MG/5ML PO SUER: 5.0000 mL | Freq: Every evening | ORAL | Status: DC | PRN

## 2015-06-21 NOTE — Telephone Encounter (Signed)
appt made

## 2015-06-21 NOTE — Patient Instructions (Signed)
The patient should continue with her Mucinex twice daily with a large glass of water She should continue with cool mist humidifier She should use the stronger cough medicine if needed for the nighttime cough She should take the antibiotic, Levaquin as directed until completed We will be discussing with the pulmonologist her situation and we'll make sure that he sees her on a return visit in either the end of this week or the first of next week. If she gets worse she is to call sooner

## 2015-06-21 NOTE — Progress Notes (Signed)
Subjective:    Patient ID: Shelly Bennett, female    DOB: Apr 05, 1951, 64 y.o.   MRN: [id number]  HPI Patient here today for follow up on cough and congestion that seems to be getting worse. The patient continues to cough and is feeling miserable. She comes to the visit today with her husband. She is now coughing up green sputum. Her pulse ox was good at the initial time of the visit today. The cough is mostly a dry cough but does occasionally bring up green sputum at times. Her recent x-rays were reviewed and there was a definite clearing after 1 month of having pneumonia the first time and this is when she had her knee surgery.     Patient Active Problem List   Diagnosis Date Noted  . Vitamin D deficiency 08/19/2014  . Family history of colon cancer-father at 40+ grandparent w/ rectal cancer 01/14/2014  . Osteopenia 08/12/2013  . CAD (coronary artery disease), native coronary artery 03/22/2013  . Aortic valve stenosis, mild 03/22/2013  . Hyperlipemia 03/11/2013  . Hypertension 03/11/2013  . Allergic rhinitis 03/11/2013  . Left shoulder pain 03/11/2013  . IBS (irritable bowel syndrome) - with chronic recurrent abdominal pain 02/01/2012  . Personal history of colonic polyps - adenoma 02/01/2012  . Coccydynia 02/01/2012   Outpatient Encounter Prescriptions as of 06/21/2015  Medication Sig  . amLODipine (NORVASC) 10 MG tablet TAKE ONE TABLET BY MOUTH ONE  TIME DAILY  . aspirin 81 MG tablet Take 81 mg by mouth daily.   . calcium carbonate 200 MG capsule Take 250 mg by mouth 2 (two) times daily with a meal.  . Cholecalciferol (VITAMIN D-3) 5000 UNITS TABS Take 1 capsule by mouth daily. Mon thru Friday  . desonide (DESOWEN) 0.05 % cream Apply 1 application topically as needed (face irritation).   . fexofenadine (ALLEGRA) 180 MG tablet Take 180 mg by mouth daily.  . Fluticasone Furoate-Vilanterol (BREO ELLIPTA) 100-25 MCG/INH AEPB Inhale 1 puff into the lungs daily.  Marland Kitchen guaiFENesin  (MUCINEX) 600 MG 12 hr tablet Take 1,200 mg by mouth. As needed  . hydrochlorothiazide (HYDRODIURIL) 25 MG tablet TAKE ONE TABLET BY MOUTH ONE  TIME DAILY  . HYDROcodone-acetaminophen (NORCO/VICODIN) 5-325 MG per tablet   . mometasone (NASONEX) 50 MCG/ACT nasal spray USE TWO SPRAYS EACH NOSTRIL ONCE DAILY.  Marland Kitchen omega-3 acid ethyl esters (LOVAZA) 1 G capsule TAKE ONE CAPSULE BY MOUTH FOUR TIMES DAILY  . omeprazole (PRILOSEC) 20 MG capsule Take 20 mg by mouth daily. One by mouth Mon Wed Friday  . ondansetron (ZOFRAN) 4 MG tablet   . rosuvastatin (CRESTOR) 40 MG tablet Take one po qd  . valACYclovir (VALTREX) 1000 MG tablet Take 1 tablet by mouth daily.  . [DISCONTINUED] predniSONE (DELTASONE) 10 MG tablet 1 tablet 4 times a day for 2 days,  1 tablet 3 times a day for 2 days,  1 tablet 2 times a day for 2 days, 1 tablet daily for 2 days   No facility-administered encounter medications on file as of 06/21/2015.      Review of Systems  Constitutional: Positive for fever.  HENT: Positive for congestion (green).   Eyes: Negative.   Respiratory: Positive for cough.   Cardiovascular: Negative.   Gastrointestinal: Negative.   Endocrine: Negative.   Genitourinary: Negative.   Musculoskeletal: Negative.   Skin: Negative.   Allergic/Immunologic: Negative.   Neurological: Negative.   Hematological: Negative.   Psychiatric/Behavioral: Negative.  Objective:   Physical Exam  Constitutional: She is oriented to person, place, and time. She appears well-developed and well-nourished. She appears distressed.  The patient is alert but feeling bad because of the persistence of this cough and congestion.  HENT:  Head: Normocephalic and atraumatic.  Nose: Nose normal.  Mouth/Throat: Oropharynx is clear and moist.  Eyes: Conjunctivae and EOM are normal. Pupils are equal, round, and reactive to light. Right eye exhibits no discharge. Left eye exhibits no discharge. No scleral icterus.  Neck:  Normal range of motion. Neck supple. No thyromegaly present.  Cardiovascular: Normal rate and regular rhythm.   Murmur heard. The heart had a regular rate and rhythm at 26/R with a systolic ejection murmur from her aortic stenosis.  Pulmonary/Chest: Breath sounds normal. She is in respiratory distress. She has no wheezes. She has no rales. She exhibits no tenderness.  She has a persistent dry irritating cough. She did bring up some green sputum during the coughing episode in the office. The patient had a dry cough and per auscultation I did not hear any rales  Abdominal: She exhibits no mass.  Musculoskeletal: Normal range of motion. She exhibits no edema.  Lymphadenopathy:    She has no cervical adenopathy.  Neurological: She is alert and oriented to person, place, and time.  Skin: Skin is warm and dry. No rash noted.  Psychiatric: She has a normal mood and affect. Her behavior is normal. Judgment and thought content normal.  Malaise  Nursing note and vitals reviewed.  Blood pressure 124/78, pulse 94, temperature 98.8 F (37.1 C), temperature source Oral, height 5' 8" (1.727 m), weight 184 lb (83.462 kg), SpO2 100 %.  [facility] reading (PRIMARY) by  Dr. Brunilda Payor x-ray-left lower lobe pneumonia confirmed by radiology                                       Assessment & Plan:  1. Fever, unspecified fever cause -This is most likely due to the left lower lobe pneumonia that was observed on chest x-ray today. - POCT CBC - BMP8+EGFR - Hepatic function panel - DG Chest 2 View; Future - POCT UA - Microscopic Only - POCT urinalysis dipstick - Urine culture - POCT Influenza A/B  2. Cough -She has the persistent cough and x-ray revealed left lower lobe pneumonia - POCT CBC - BMP8+EGFR - Hepatic function panel - DG Chest 2 View; Future - POCT UA - Microscopic Only - POCT urinalysis dipstick - Urine culture - POCT Influenza A/B  3. Flank pain -The urine was clear but she has a left  lower lobe pneumonia and she was treated for this. - POCT CBC - POCT UA - Microscopic Only - POCT urinalysis dipstick - Urine culture  4. Left lower lobe pneumonia -Levaquin 500 one daily for 7 days -Continue with Mucinex and cool mist humidification -Continue with Brio inhaler and rinse mouth after using -Take Tussionex as needed for severe cough at nighttime -Drink plenty of fluids -Follow-up with pulmonology  Meds ordered this encounter  Medications  . chlorpheniramine-HYDROcodone (TUSSIONEX PENNKINETIC ER) 10-8 MG/5ML SUER    Sig: Take 5 mLs by mouth at bedtime as needed for cough.    Dispense:  140 mL    Refill:  0  . levofloxacin (LEVAQUIN) 500 MG tablet    Sig: Take 1 tablet (500 mg total) by mouth daily.    Dispense:  7 tablet    Refill:  0   I will discuss this case with the pulmonologist when he returns from lunch today and call the patient with the time that he will see her.  Patient Instructions  The patient should continue with her Mucinex twice daily with a large glass of water She should continue with cool mist humidifier She should use the stronger cough medicine if needed for the nighttime cough She should take the antibiotic, Levaquin as directed until completed We will be discussing with the pulmonologist her situation and we'll make sure that he sees her on a return visit in either the end of this week or the first of next week. If she gets worse she is to call sooner   Arrie Senate MD

## 2015-06-22 ENCOUNTER — Ambulatory Visit (INDEPENDENT_AMBULATORY_CARE_PROVIDER_SITE_OTHER): Payer: BC Managed Care – PPO | Admitting: Internal Medicine

## 2015-06-22 ENCOUNTER — Encounter: Payer: Self-pay | Admitting: Internal Medicine

## 2015-06-22 VITALS — BP 122/70 | HR 94 | Ht 67.75 in | Wt 187.8 lb

## 2015-06-22 DIAGNOSIS — J189 Pneumonia, unspecified organism: Secondary | ICD-10-CM

## 2015-06-22 DIAGNOSIS — J181 Lobar pneumonia, unspecified organism: Principal | ICD-10-CM

## 2015-06-22 DIAGNOSIS — I350 Nonrheumatic aortic (valve) stenosis: Secondary | ICD-10-CM | POA: Diagnosis not present

## 2015-06-22 LAB — BMP8+EGFR
BUN / CREAT RATIO: 10 — AB (ref 11–26)
BUN: 7 mg/dL — AB (ref 8–27)
CHLORIDE: 101 mmol/L (ref 97–108)
CO2: 24 mmol/L (ref 18–29)
CREATININE: 0.68 mg/dL (ref 0.57–1.00)
Calcium: 9.5 mg/dL (ref 8.7–10.3)
GFR calc Af Amer: 107 mL/min/{1.73_m2} (ref >59)
GFR calc non Af Amer: 93 mL/min/{1.73_m2} (ref >59)
Glucose: 93 mg/dL (ref 65–99)
Potassium: 4 mmol/L (ref 3.5–5.2)
Sodium: 142 mmol/L (ref 134–144)

## 2015-06-22 LAB — HEPATIC FUNCTION PANEL
ALT: 23 IU/L (ref 0–32)
AST: 20 IU/L (ref 0–40)
Albumin: 3.9 g/dL (ref 3.6–4.8)
Alkaline Phosphatase: 79 IU/L (ref 39–117)
Bilirubin Total: 0.6 mg/dL (ref 0.0–1.2)
Bilirubin, Direct: 0.16 mg/dL (ref 0.00–0.40)
Total Protein: 6.4 g/dL (ref 6.0–8.5)

## 2015-06-22 LAB — URINE CULTURE

## 2015-06-22 MED ORDER — LEVALBUTEROL HCL 0.63 MG/3ML IN NEBU
0.6300 mg | INHALATION_SOLUTION | Freq: Once | RESPIRATORY_TRACT | Status: AC
  Administered 2015-06-22: 0.63 mg via RESPIRATORY_TRACT

## 2015-06-22 MED ORDER — UMECLIDINIUM-VILANTEROL 62.5-25 MCG/INH IN AEPB: 1.0000 | INHALATION_SPRAY | Freq: Every day | RESPIRATORY_TRACT | Status: DC

## 2015-06-22 NOTE — Progress Notes (Signed)
06/22/15- [name] referred courtesy of Dr Laurance Flatten because of recurrent LLL pneumonia Medical hx includes HBP, IBS, allergic rhinitis, CAD Husband here Had community-acquired pneumonia in May and had cleared on follow-up chest x-ray May 31. Left knee surgery under general anesthesia June 5. Was going to physical therapy rehabilitation. Cough began again 2- 3 weeks later and has had some chills and fever. Levaquin restarted yesterday. Cough is productive green and also seeing some green discharge from nose and eye. No history of lung disease including prior pneumonia or TB. 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 Not much reflux currently on omeprazole every other day. Using Flonase and Breo. Has not had pneumonia vaccine. We reviewed the xray images CXR 04/18/15 IMPRESSION: Left lower lobe airspace disease is consistent with pneumonia given leukocytosis and cough. Followup PA and lateral chest X-ray is recommended in 3-4 weeks following trial of antibiotic therapy to ensure resolution and exclude underlying malignancy. Electronically Signed  By: Monte Fantasia M.D.  On: 04/18/2015 15:32 CXR 05/17/15 IMPRESSION: Interval clearing of the left lower lobe pneumonia. There is no active cardiopulmonary disease. Electronically Signed  By: David Martinique M.D.  On: 05/17/2015 11:29 CXR 06/21/15 IMPRESSION: Left lower lobe infiltrate consistent with pneumonia. Followup PA and lateral chest X-ray is recommended in 3-4 weeks following trial of antibiotic therapy to ensure resolution and exclude underlying malignancy. Electronically Signed  By: David Martinique M.D.  On: 06/21/2015 12:48  Prior to Admission medications   Medication Sig Start Date End Date Taking? Authorizing Provider  amLODipine (NORVASC) 10 MG tablet TAKE ONE TABLET BY MOUTH ONE  TIME  DAILY 12/31/14  Yes Chipper Herb, MD  aspirin 81 MG tablet Take 81 mg by mouth daily.    Yes Historical Provider, MD  calcium carbonate 200 MG capsule Take 250 mg by mouth 2 (two) times daily with a meal.   Yes Historical Provider, MD  chlorpheniramine-HYDROcodone (TUSSIONEX PENNKINETIC ER) 10-8 MG/5ML SUER Take 5 mLs by mouth at bedtime as needed for cough. 06/21/15  Yes Chipper Herb, MD  Cholecalciferol (VITAMIN D-3) 5000 UNITS TABS Take 1 capsule by mouth daily. Mon thru Friday   Yes Historical Provider, MD  desonide (DESOWEN) 0.05 % cream Apply 1 application topically as needed (face irritation).  06/05/13  Yes Historical Provider, MD  fexofenadine (ALLEGRA) 180 MG tablet Take 180 mg by mouth daily.   Yes Historical Provider, MD  Fluticasone Furoate-Vilanterol (BREO ELLIPTA) 100-25 MCG/INH AEPB Inhale 1 puff into the lungs daily. 05/02/15  Yes Chipper Herb, MD  guaiFENesin (MUCINEX) 600 MG 12 hr tablet Take 1,200 mg by mouth. As needed   Yes Historical Provider, MD  hydrochlorothiazide (HYDRODIURIL) 25 MG tablet TAKE ONE TABLET BY MOUTH ONE  TIME DAILY 12/31/14  Yes Chipper Herb, MD  HYDROcodone-acetaminophen (NORCO/VICODIN) 5-325 MG per tablet  05/19/15  Yes Historical Provider, MD  levofloxacin (LEVAQUIN) 500 MG tablet Take 1 tablet (500 mg total) by mouth daily. 06/21/15  Yes Chipper Herb, MD  mometasone (NASONEX) 50 MCG/ACT nasal spray USE TWO SPRAYS EACH NOSTRIL ONCE DAILY. 06/02/15  Yes Chipper Herb, MD  omega-3 acid ethyl esters (LOVAZA) 1 G capsule TAKE ONE CAPSULE BY MOUTH FOUR TIMES DAILY 03/06/15  Yes Chipper Herb, MD  omeprazole (PRILOSEC) 20 MG capsule Take 20 mg by mouth daily. One by mouth Mon Wed  Friday   Yes Historical Provider, MD  rosuvastatin (CRESTOR) 40 MG tablet Take one po qd 05/17/15  Yes Chipper Herb, MD  valACYclovir (VALTREX) 1000 MG tablet Take 1 tablet by mouth daily. 08/16/14  Yes Historical Provider, MD  ondansetron (ZOFRAN) 4 MG tablet  05/19/15   Historical  Provider, MD  Umeclidinium-Vilanterol (ANORO ELLIPTA) 62.5-25 MCG/INH AEPB Inhale 1 puff into the lungs daily. 06/22/15   Deneise Lever, MD   Past Medical History  Diagnosis Date  . Arthritis   . Chronic headaches   . Adenomatous colon polyp   . CAD (coronary artery disease)     Dr. Burt Knack follows   . HLD (hyperlipidemia)   . HTN (hypertension)   . Diverticulosis   . IBS (irritable bowel syndrome)   . Obesity   . External hemorrhoids   . Cystocele    Past Surgical History  Procedure Laterality Date  . Knee surgery  05/22/2015    right  . Carpal tunnel release    . Colonoscopy  12/15/2008    diverticulosis, external hemorrhoids  . Laproscopy     Family History  Problem Relation Age of Onset  . Prostate cancer Father   . Colon cancer Father 58  . Kidney disease Father   . Hyperlipidemia Mother   . Hypertension Mother   . Kidney disease Mother   . Osteoporosis Mother   . Hyperlipidemia Sister   . Hypertension Sister   . Heart disease Brother 82    not dx questionable   . Heart disease Father   . Heart murmur Mother   . Heart disease Maternal Grandfather    History   Social History  . Marital Status: Married    Spouse Name: [name]  . Number of Children: 4  . Years of Education: N/A   Occupational History  . teacher retired    Social History Main Topics  . Smoking status: Never Smoker   . Smokeless tobacco: Never Used  . Alcohol Use: No  . Drug Use: No  . Sexual Activity: Not on file   Other Topics Concern  . Not on file   Social History Narrative   ROS-see HPI   Negative unless "+" Constitutional:    weight loss, night sweats, fevers, chills, fatigue, lassitude. HEENT:    headaches, difficulty swallowing, tooth/dental problems, sore throat,       sneezing, itching, +ear ache, +nasal congestion, post nasal drip, snoring CV:    chest pain, orthopnea, PND, swelling in lower extremities, anasarca,                                  dizziness,  palpitations Resp:  + shortness of breath with exertion or at rest.                +productive cough,   +non-productive cough, coughing up of blood.              +change in color of mucus.  wheezing.   Skin:    rash or lesions. GI:  No-   heartburn, indigestion, abdominal pain, nausea, vomiting, diarrhea,                 change in bowel habits, loss of appetite GU: dysuria, change in color of urine, no urgency or frequency.   flank pain. MS:   joint pain, stiffness, decreased range of motion, back pain. Neuro-  nothing unusual Psych:  change in mood or affect.  depression or anxiety.   memory loss.  OBJ- Physical Exam General- Alert, Oriented, Affect-appropriate, Distress- none acute Skin- rash-none, lesions- none, excoriation- none Lymphadenopathy- none Head- atraumatic            Eyes- Gross vision intact, PERRLA, conjunctivae and secretions clear            Ears- Hearing, canals-normal            Nose- Clear, no-Septal dev, mucus, polyps, erosion, perforation             Throat- Mallampati II , mucosa + red without exudate, drainage- none, tonsils- atrophic Neck- flexible , trachea midline, no stridor , thyroid nl, carotid no bruit Chest - symmetrical excursion , unlabored           Heart/CV- RRR ,  Murmur+ 2/6 systolic , no gallop  , no rub, nl s1 s2                           - JVD- none , edema- none, stasis changes- none, varices- none           Lung-+ slight bilateral rhonchi unlabored, wheeze- none, cough + dry , dullness-none, rub- none           Chest wall-  Abd-  Br/ Gen/ Rectal- Not done, not indicated Extrem- cyanosis- none, clubbing, none, atrophy- none, strength- nl Neuro- grossly intact to observation

## 2015-06-22 NOTE — Patient Instructions (Signed)
Finish the [employer] to take the mucinex   Ok to take the tussionex cough syrup twice daily if needed. You can supplement it with an otc cough syrup like Delsym, and with throat lozenges as needed.  Avoid chilling/ drafts, get plenty of fluids, and try to get some rest  Instead of Breo, try sample Anoro 1 puff, once daily  Neb xop 0.63  Please call as needed

## 2015-06-23 ENCOUNTER — Encounter: Payer: BC Managed Care – PPO | Admitting: Physical Therapy

## 2015-06-23 DIAGNOSIS — J189 Pneumonia, unspecified organism: Secondary | ICD-10-CM | POA: Insufficient documentation

## 2015-06-23 NOTE — Assessment & Plan Note (Addendum)
Simplest explanation would be relapse of original pneumonia in the same location. Possibly a second, separate infection after general anesthesia and then exposure to people in physical therapy. Since she is already on Levaquin it is appropriate to follow now. Inhaled steroid and acid blocker therapy both have small statistical risk of pneumonia. I don't get a history suggesting that she refluxed and aspirated. She was never a smoker, but once this clears it will be appropriate to consider PFT and possibly CT scan of chest. We will also want pneumonia vaccination. Levaquin is an appropriate choice for now. Her cough is exhausting and she can use cough syrup to get some rest.

## 2015-06-23 NOTE — Assessment & Plan Note (Signed)
Murmur on today's exam is consistent with aortic stenosis and will be followed

## 2015-06-24 ENCOUNTER — Telehealth: Payer: Self-pay | Admitting: Family Medicine

## 2015-06-24 ENCOUNTER — Ambulatory Visit (INDEPENDENT_AMBULATORY_CARE_PROVIDER_SITE_OTHER): Payer: BC Managed Care – PPO | Admitting: Family Medicine

## 2015-06-24 ENCOUNTER — Encounter: Payer: Self-pay | Admitting: Family Medicine

## 2015-06-24 ENCOUNTER — Encounter: Payer: BC Managed Care – PPO | Admitting: Physical Therapy

## 2015-06-24 VITALS — BP 114/75 | HR 93 | Temp 97.9°F | Ht 67.75 in | Wt 187.0 lb

## 2015-06-24 DIAGNOSIS — R21 Rash and other nonspecific skin eruption: Secondary | ICD-10-CM

## 2015-06-24 DIAGNOSIS — T887XXA Unspecified adverse effect of drug or medicament, initial encounter: Secondary | ICD-10-CM

## 2015-06-24 DIAGNOSIS — J189 Pneumonia, unspecified organism: Secondary | ICD-10-CM | POA: Diagnosis not present

## 2015-06-24 DIAGNOSIS — T50905A Adverse effect of unspecified drugs, medicaments and biological substances, initial encounter: Secondary | ICD-10-CM

## 2015-06-24 LAB — POCT CBC
GRANULOCYTE PERCENT: 81.9 % — AB (ref 37–80)
HEMATOCRIT: 40.9 % (ref 37.7–47.9)
Hemoglobin: 13.4 g/dL (ref 12.2–16.2)
Lymph, poc: 2.1 (ref 0.6–3.4)
MCH, POC: 30.1 pg (ref 27–31.2)
MCHC: 32.8 g/dL (ref 31.8–35.4)
MCV: 91.8 fL (ref 80–97)
MPV: 7.3 fL (ref 0–99.8)
POC GRANULOCYTE: 12.1 — AB (ref 2–6.9)
POC LYMPH PERCENT: 14.5 %L (ref 10–50)
Platelet Count, POC: 383 10*3/uL (ref 142–424)
RBC: 4.46 M/uL (ref 4.04–5.48)
RDW, POC: 14.1 %
WBC: 14.8 10*3/uL — AB (ref 4.6–10.2)

## 2015-06-24 MED ORDER — METHYLPREDNISOLONE ACETATE 80 MG/ML IJ SUSP
60.0000 mg | Freq: Once | INTRAMUSCULAR | Status: AC
  Administered 2015-06-24: 60 mg via INTRAMUSCULAR

## 2015-06-24 MED ORDER — AZITHROMYCIN 250 MG PO TABS: ORAL_TABLET | ORAL | Status: DC

## 2015-06-24 NOTE — Patient Instructions (Signed)
Discontinue Levaquin Take as little of the codeine cough medicine as possible Start the Z-Pak tomorrow Use cold wet compresses to abdomen and waistline Take Benadryl 25 mg every 6 hours Use topical cortisone 10 sparingly to the areas involved with a rash 2-3 times daily Keep drinking plenty of fluids Stay cool

## 2015-06-24 NOTE — Progress Notes (Signed)
Subjective:    Patient ID: Shelly Bennett, female    DOB: [birth year], 64 y.o.   MRN: [id number]  HPI Patient here today for a itching rash that first started today. This is the third day of the antibiotic. She has seen the pulmonologist since she was last seen here and he basically agreed with the treatment except changed her inhaler.      Patient Active Problem List   Diagnosis Date Noted  . CAP (community acquired pneumonia) 06/23/2015  . Vitamin D deficiency 08/19/2014  . Family history of colon cancer-father at 6+ grandparent w/ rectal cancer 01/14/2014  . Osteopenia 08/12/2013  . CAD (coronary artery disease), native coronary artery 03/22/2013  . Aortic valve stenosis, mild 03/22/2013  . Hyperlipemia 03/11/2013  . Hypertension 03/11/2013  . Allergic rhinitis 03/11/2013  . Left shoulder pain 03/11/2013  . IBS (irritable bowel syndrome) - with chronic recurrent abdominal pain 02/01/2012  . Personal history of colonic polyps - adenoma 02/01/2012  . Coccydynia 02/01/2012   Outpatient Encounter Prescriptions as of 06/24/2015  Medication Sig  . amLODipine (NORVASC) 10 MG tablet TAKE ONE TABLET BY MOUTH ONE  TIME DAILY  . aspirin 81 MG tablet Take 81 mg by mouth daily.   . calcium carbonate 200 MG capsule Take 250 mg by mouth 2 (two) times daily with a meal.  . chlorpheniramine-HYDROcodone (TUSSIONEX PENNKINETIC ER) 10-8 MG/5ML SUER Take 5 mLs by mouth at bedtime as needed for cough.  . Cholecalciferol (VITAMIN D-3) 5000 UNITS TABS Take 1 capsule by mouth daily. Mon thru Friday  . desonide (DESOWEN) 0.05 % cream Apply 1 application topically as needed (face irritation).   . fexofenadine (ALLEGRA) 180 MG tablet Take 180 mg by mouth daily.  . Fluticasone Furoate-Vilanterol (BREO ELLIPTA) 100-25 MCG/INH AEPB Inhale 1 puff into the lungs daily.  Marland Kitchen guaiFENesin (MUCINEX) 600 MG 12 hr tablet Take 1,200 mg by mouth. As needed  . hydrochlorothiazide (HYDRODIURIL) 25 MG tablet TAKE ONE  TABLET BY MOUTH ONE  TIME DAILY  . HYDROcodone-acetaminophen (NORCO/VICODIN) 5-325 MG per tablet   . levofloxacin (LEVAQUIN) 500 MG tablet Take 1 tablet (500 mg total) by mouth daily.  . mometasone (NASONEX) 50 MCG/ACT nasal spray USE TWO SPRAYS EACH NOSTRIL ONCE DAILY.  Marland Kitchen omega-3 acid ethyl esters (LOVAZA) 1 G capsule TAKE ONE CAPSULE BY MOUTH FOUR TIMES DAILY  . omeprazole (PRILOSEC) 20 MG capsule Take 20 mg by mouth daily. One by mouth Mon Wed Friday  . ondansetron (ZOFRAN) 4 MG tablet   . rosuvastatin (CRESTOR) 40 MG tablet Take one po qd  . Umeclidinium-Vilanterol (ANORO ELLIPTA) 62.5-25 MCG/INH AEPB Inhale 1 puff into the lungs daily.  . valACYclovir (VALTREX) 1000 MG tablet Take 1 tablet by mouth daily.   No facility-administered encounter medications on file as of 06/24/2015.     Review of Systems  Constitutional: Negative.   HENT: Negative.   Eyes: Negative.   Respiratory: Negative.   Cardiovascular: Negative.   Gastrointestinal: Negative.   Endocrine: Negative.   Genitourinary: Negative.   Musculoskeletal: Arthralgias: body stiffness.  Skin: Positive for rash (mostly on belly and a few spots other places).  Allergic/Immunologic: Negative.   Neurological: Negative.   Hematological: Negative.   Psychiatric/Behavioral: Negative.        Objective:   Physical Exam  Constitutional: She is oriented to person, place, and time. She appears well-developed and well-nourished. She appears distressed.  HENT:  Head: Normocephalic and atraumatic.  Eyes: Conjunctivae and EOM are normal. Right  eye exhibits no discharge. Left eye exhibits no discharge. No scleral icterus.  Neck: Normal range of motion.  Cardiovascular: Normal rate, regular rhythm and normal heart sounds.   No murmur heard. At 84/m  Pulmonary/Chest: Effort normal and breath sounds normal. She has no wheezes. She has no rales.  Musculoskeletal: Normal range of motion.  The patient is weak from her pneumonia and her  recent surgery  Neurological: She is alert and oriented to person, place, and time.  Skin: Skin is warm and dry. Rash noted. No erythema. No pallor.  He should has urticaria at the bra line bilaterally and anteriorly. She also has a milder amount of urticaria and whelps around the waistline.  Psychiatric: She has a normal mood and affect. Her behavior is normal. Thought content normal.  Nursing note and vitals reviewed.  BP 114/75 mmHg  Pulse 93  Temp(Src) 97.9 F (36.6 C) (Oral)  Ht 5' 7.75" (1.721 m)  Wt 187 lb (84.823 kg)  BMI 28.64 kg/m2  Results for orders placed or performed in visit on 06/24/15  POCT CBC  Result Value Ref Range   WBC 14.8 (A) 4.6 - 10.2 K/uL   Lymph, poc 2.1 0.6 - 3.4   POC LYMPH PERCENT 14.5 10 - 50 %L   POC Granulocyte 12.1 (A) 2 - 6.9   Granulocyte percent 81.9 (A) 37 - 80 %G   RBC 4.46 4.04 - 5.48 M/uL   Hemoglobin 13.4 12.2 - 16.2 g/dL   HCT, POC 40.9 37.7 - 47.9 %   MCV 91.8 80 - 97 fL   MCH, POC 30.1 27 - 31.2 pg   MCHC 32.8 31.8 - 35.4 g/dL   RDW, POC 14.1 %   Platelet Count, POC 383.0 142 - 424 K/uL   MPV 7.3 0 - 99.8 fL         Assessment & Plan:  1. Pneumonia, organism unspecified -Because of this urticarial type reaction at the waistline and beneath the breast bilaterally we will discontinue the Levaquin and ask her to take as little cough medicine with codeine as possible -We will ask her to start a Z-Pak - POCT CBC - BMP8+EGFR - Hepatic function panel  2. Rash -Use topical cortisone 10 for the next 3-4 days and apply cold packs to the area of urticaria - methylPREDNISolone acetate (DEPO-MEDROL) injection 60 mg; Inject 0.75 mLs (60 mg total) into the muscle once.  3. Drug reaction, initial encounter -Take Benadryl -Use cortisone 10 -Discontinue Levaquin and codeine cough medicine -Continue with inhaler  4. Malaise and weakness -We will call with the results of the BMP when it is received tomorrow  Meds ordered this  encounter  Medications  . azithromycin (ZITHROMAX) 250 MG tablet    Sig: As directed    Dispense:  6 tablet    Refill:  0  . methylPREDNISolone acetate (DEPO-MEDROL) injection 60 mg    Sig:    Patient Instructions  Discontinue Levaquin Take as little of the codeine cough medicine as possible Start the Z-Pak tomorrow Use cold wet compresses to abdomen and waistline Take Benadryl 25 mg every 6 hours Use topical cortisone 10 sparingly to the areas involved with a rash 2-3 times daily Keep drinking plenty of fluids Stay cool   Arrie Senate MD

## 2015-06-25 ENCOUNTER — Encounter (HOSPITAL_COMMUNITY): Payer: Self-pay | Admitting: *Deleted

## 2015-06-25 ENCOUNTER — Inpatient Hospital Stay (HOSPITAL_COMMUNITY)
Admission: AD | Admit: 2015-06-25 | Discharge: 2015-06-27 | DRG: 194 | Disposition: A | Discharge status: Home / Self Care | Payer: BC Managed Care – PPO | Source: Ambulatory Visit | Attending: Internal Medicine | Admitting: Internal Medicine

## 2015-06-25 ENCOUNTER — Inpatient Hospital Stay (HOSPITAL_COMMUNITY): Payer: BC Managed Care – PPO

## 2015-06-25 DIAGNOSIS — I10 Essential (primary) hypertension: Secondary | ICD-10-CM | POA: Diagnosis present

## 2015-06-25 DIAGNOSIS — E785 Hyperlipidemia, unspecified: Secondary | ICD-10-CM | POA: Diagnosis present

## 2015-06-25 DIAGNOSIS — K589 Irritable bowel syndrome without diarrhea: Secondary | ICD-10-CM | POA: Diagnosis present

## 2015-06-25 DIAGNOSIS — Z91048 Other nonmedicinal substance allergy status: Secondary | ICD-10-CM | POA: Diagnosis not present

## 2015-06-25 DIAGNOSIS — Z7982 Long term (current) use of aspirin: Secondary | ICD-10-CM

## 2015-06-25 DIAGNOSIS — R51 Headache: Secondary | ICD-10-CM | POA: Diagnosis present

## 2015-06-25 DIAGNOSIS — I350 Nonrheumatic aortic (valve) stenosis: Secondary | ICD-10-CM | POA: Diagnosis present

## 2015-06-25 DIAGNOSIS — Y95 Nosocomial condition: Secondary | ICD-10-CM | POA: Diagnosis present

## 2015-06-25 DIAGNOSIS — T7840XA Allergy, unspecified, initial encounter: Secondary | ICD-10-CM

## 2015-06-25 DIAGNOSIS — Z888 Allergy status to other drugs, medicaments and biological substances status: Secondary | ICD-10-CM | POA: Diagnosis not present

## 2015-06-25 DIAGNOSIS — I2510 Atherosclerotic heart disease of native coronary artery without angina pectoris: Secondary | ICD-10-CM | POA: Diagnosis present

## 2015-06-25 DIAGNOSIS — Z86010 Personal history of colonic polyps: Secondary | ICD-10-CM | POA: Diagnosis not present

## 2015-06-25 DIAGNOSIS — E871 Hypo-osmolality and hyponatremia: Secondary | ICD-10-CM | POA: Diagnosis present

## 2015-06-25 DIAGNOSIS — Z79899 Other long term (current) drug therapy: Secondary | ICD-10-CM | POA: Diagnosis not present

## 2015-06-25 DIAGNOSIS — Z6828 Body mass index (BMI) 28.0-28.9, adult: Secondary | ICD-10-CM | POA: Diagnosis not present

## 2015-06-25 DIAGNOSIS — E669 Obesity, unspecified: Secondary | ICD-10-CM | POA: Diagnosis present

## 2015-06-25 DIAGNOSIS — L509 Urticaria, unspecified: Secondary | ICD-10-CM | POA: Diagnosis present

## 2015-06-25 DIAGNOSIS — E860 Dehydration: Secondary | ICD-10-CM | POA: Diagnosis present

## 2015-06-25 DIAGNOSIS — J189 Pneumonia, unspecified organism: Principal | ICD-10-CM | POA: Diagnosis present

## 2015-06-25 DIAGNOSIS — K5790 Diverticulosis of intestine, part unspecified, without perforation or abscess without bleeding: Secondary | ICD-10-CM | POA: Diagnosis present

## 2015-06-25 DIAGNOSIS — Z889 Allergy status to unspecified drugs, medicaments and biological substances status: Secondary | ICD-10-CM | POA: Diagnosis not present

## 2015-06-25 DIAGNOSIS — T3695XA Adverse effect of unspecified systemic antibiotic, initial encounter: Secondary | ICD-10-CM | POA: Diagnosis present

## 2015-06-25 DIAGNOSIS — I951 Orthostatic hypotension: Secondary | ICD-10-CM | POA: Diagnosis present

## 2015-06-25 DIAGNOSIS — Z880 Allergy status to penicillin: Secondary | ICD-10-CM | POA: Diagnosis not present

## 2015-06-25 DIAGNOSIS — T40605A Adverse effect of unspecified narcotics, initial encounter: Secondary | ICD-10-CM | POA: Diagnosis present

## 2015-06-25 DIAGNOSIS — M1990 Unspecified osteoarthritis, unspecified site: Secondary | ICD-10-CM | POA: Diagnosis present

## 2015-06-25 HISTORY — DX: Allergy, unspecified, initial encounter: T78.40XA

## 2015-06-25 HISTORY — DX: Pneumonia, unspecified organism: J18.9

## 2015-06-25 LAB — CBC WITH DIFFERENTIAL/PLATELET
Basophils Absolute: 0 10*3/uL (ref 0.0–0.1)
Basophils Relative: 0 % (ref 0–1)
EOS ABS: 0 10*3/uL (ref 0.0–0.7)
EOS PCT: 0 % (ref 0–5)
HCT: 34.3 % — ABNORMAL LOW (ref 36.0–46.0)
Hemoglobin: 11.8 g/dL — ABNORMAL LOW (ref 12.0–15.0)
LYMPHS ABS: 1.5 10*3/uL (ref 0.7–4.0)
Lymphocytes Relative: 15 % (ref 12–46)
MCH: 31.5 pg (ref 26.0–34.0)
MCHC: 34.4 g/dL (ref 30.0–36.0)
MCV: 91.5 fL (ref 78.0–100.0)
Monocytes Absolute: 0.2 10*3/uL (ref 0.1–1.0)
Monocytes Relative: 2 % — ABNORMAL LOW (ref 3–12)
NEUTROS PCT: 83 % — AB (ref 43–77)
Neutro Abs: 8.4 10*3/uL — ABNORMAL HIGH (ref 1.7–7.7)
PLATELETS: 292 10*3/uL (ref 150–400)
RBC: 3.75 MIL/uL — AB (ref 3.87–5.11)
RDW: 13.5 % (ref 11.5–15.5)
WBC: 10.1 10*3/uL (ref 4.0–10.5)

## 2015-06-25 LAB — COMPREHENSIVE METABOLIC PANEL
ALBUMIN: 2.6 g/dL — AB (ref 3.5–5.0)
ALT: 25 U/L (ref 14–54)
ANION GAP: 11 (ref 5–15)
AST: 23 U/L (ref 15–41)
Alkaline Phosphatase: 59 U/L (ref 38–126)
BUN: 12 mg/dL (ref 6–20)
CHLORIDE: 98 mmol/L — AB (ref 101–111)
CO2: 23 mmol/L (ref 22–32)
CREATININE: 0.79 mg/dL (ref 0.44–1.00)
Calcium: 8.8 mg/dL — ABNORMAL LOW (ref 8.9–10.3)
GFR calc Af Amer: >60 mL/min (ref >60)
Glucose, Bld: 111 mg/dL — ABNORMAL HIGH (ref 65–99)
Potassium: 4.1 mmol/L (ref 3.5–5.1)
Sodium: 132 mmol/L — ABNORMAL LOW (ref 135–145)
Total Bilirubin: 0.8 mg/dL (ref 0.3–1.2)
Total Protein: 5.8 g/dL — ABNORMAL LOW (ref 6.5–8.1)

## 2015-06-25 LAB — URINALYSIS, ROUTINE W REFLEX MICROSCOPIC
Bilirubin Urine: NEGATIVE
Glucose, UA: NEGATIVE mg/dL
Hgb urine dipstick: NEGATIVE
Ketones, ur: NEGATIVE mg/dL
Nitrite: NEGATIVE
PH: 7 (ref 5.0–8.0)
Protein, ur: NEGATIVE mg/dL
Specific Gravity, Urine: 1.007 (ref 1.005–1.030)
UROBILINOGEN UA: 0.2 mg/dL (ref 0.0–1.0)

## 2015-06-25 LAB — URINE MICROSCOPIC-ADD ON

## 2015-06-25 LAB — BMP8+EGFR
BUN/Creatinine Ratio: 14 (ref 11–26)
BUN: 10 mg/dL (ref 8–27)
CALCIUM: 9.2 mg/dL (ref 8.7–10.3)
CHLORIDE: 94 mmol/L — AB (ref 97–108)
CO2: 21 mmol/L (ref 18–29)
Creatinine, Ser: 0.71 mg/dL (ref 0.57–1.00)
GFR calc Af Amer: 104 mL/min/{1.73_m2} (ref >59)
GFR calc non Af Amer: 90 mL/min/{1.73_m2} (ref >59)
GLUCOSE: 103 mg/dL — AB (ref 65–99)
POTASSIUM: 4.8 mmol/L (ref 3.5–5.2)
Sodium: 132 mmol/L — ABNORMAL LOW (ref 134–144)

## 2015-06-25 LAB — STREP PNEUMONIAE URINARY ANTIGEN: STREP PNEUMO URINARY ANTIGEN: NEGATIVE

## 2015-06-25 LAB — HEPATIC FUNCTION PANEL
ALBUMIN: 3.4 g/dL — AB (ref 3.6–4.8)
ALT: 22 IU/L (ref 0–32)
AST: 20 IU/L (ref 0–40)
Alkaline Phosphatase: 74 IU/L (ref 39–117)
Bilirubin Total: 0.7 mg/dL (ref 0.0–1.2)
Bilirubin, Direct: 0.17 mg/dL (ref 0.00–0.40)
Total Protein: 5.6 g/dL — ABNORMAL LOW (ref 6.0–8.5)

## 2015-06-25 LAB — CK: Total CK: 64 U/L (ref 38–234)

## 2015-06-25 MED ORDER — UMECLIDINIUM-VILANTEROL 62.5-25 MCG/INH IN AEPB: 1.0000 | INHALATION_SPRAY | Freq: Every day | RESPIRATORY_TRACT | Status: DC

## 2015-06-25 MED ORDER — PANTOPRAZOLE SODIUM 40 MG PO TBEC: 40.0000 mg | DELAYED_RELEASE_TABLET | Freq: Every day | ORAL | Status: DC

## 2015-06-25 MED ORDER — AZTREONAM 2 G IJ SOLR
2.0000 g | Freq: Three times a day (TID) | INTRAMUSCULAR | Status: DC
  Administered 2015-06-25 – 2015-06-27 (×6): 2 g via INTRAVENOUS
  Filled 2015-06-25 (×8): qty 2

## 2015-06-25 MED ORDER — ONDANSETRON HCL 4 MG PO TABS: 4.0000 mg | ORAL_TABLET | Freq: Four times a day (QID) | ORAL | Status: DC | PRN

## 2015-06-25 MED ORDER — DEXTROSE 5 % IV SOLN: 500.0000 mg | INTRAVENOUS | Status: DC

## 2015-06-25 MED ORDER — SODIUM CHLORIDE 0.9 % IV SOLN
INTRAVENOUS | Status: DC
  Administered 2015-06-25 – 2015-06-27 (×2): via INTRAVENOUS

## 2015-06-25 MED ORDER — VANCOMYCIN HCL IN DEXTROSE 1-5 GM/200ML-% IV SOLN
1000.0000 mg | Freq: Two times a day (BID) | INTRAVENOUS | Status: DC
  Administered 2015-06-26 – 2015-06-27 (×3): 1000 mg via INTRAVENOUS
  Filled 2015-06-25 (×4): qty 200

## 2015-06-25 MED ORDER — FAMOTIDINE IN NACL 20-0.9 MG/50ML-% IV SOLN
20.0000 mg | Freq: Two times a day (BID) | INTRAVENOUS | Status: DC
  Administered 2015-06-25 – 2015-06-27 (×5): 20 mg via INTRAVENOUS
  Filled 2015-06-25 (×7): qty 50

## 2015-06-25 MED ORDER — IPRATROPIUM-ALBUTEROL 0.5-2.5 (3) MG/3ML IN SOLN
3.0000 mL | Freq: Four times a day (QID) | RESPIRATORY_TRACT | Status: DC
  Administered 2015-06-25: 3 mL via RESPIRATORY_TRACT
  Filled 2015-06-25: qty 3

## 2015-06-25 MED ORDER — OMEGA-3-ACID ETHYL ESTERS 1 G PO CAPS
1.0000 | ORAL_CAPSULE | Freq: Four times a day (QID) | ORAL | Status: DC
  Administered 2015-06-25 – 2015-06-27 (×8): 1 g via ORAL
  Filled 2015-06-25 (×13): qty 1

## 2015-06-25 MED ORDER — METHYLPREDNISOLONE SODIUM SUCC 125 MG IJ SOLR
60.0000 mg | Freq: Two times a day (BID) | INTRAMUSCULAR | Status: DC
  Administered 2015-06-25 – 2015-06-26 (×2): 60 mg via INTRAVENOUS
  Filled 2015-06-25: qty 2
  Filled 2015-06-25 (×2): qty 0.96
  Filled 2015-06-25: qty 2

## 2015-06-25 MED ORDER — GUAIFENESIN 100 MG/5ML PO SYRP
200.0000 mg | ORAL_SOLUTION | ORAL | Status: DC | PRN
  Filled 2015-06-25: qty 10

## 2015-06-25 MED ORDER — CALCIUM CARBONATE 200 MG PO CAPS: 250.0000 mg | ORAL_CAPSULE | Freq: Two times a day (BID) | ORAL | Status: DC

## 2015-06-25 MED ORDER — ACETAMINOPHEN 650 MG RE SUPP: 650.0000 mg | Freq: Four times a day (QID) | RECTAL | Status: DC | PRN

## 2015-06-25 MED ORDER — DIPHENHYDRAMINE HCL 50 MG/ML IJ SOLN: 12.5000 mg | Freq: Four times a day (QID) | INTRAMUSCULAR | Status: DC | PRN

## 2015-06-25 MED ORDER — LORATADINE 10 MG PO TABS
10.0000 mg | ORAL_TABLET | Freq: Every day | ORAL | Status: DC
  Administered 2015-06-26 – 2015-06-27 (×2): 10 mg via ORAL
  Filled 2015-06-25 (×3): qty 1

## 2015-06-25 MED ORDER — ENOXAPARIN SODIUM 40 MG/0.4ML ~~LOC~~ SOLN
40.0000 mg | SUBCUTANEOUS | Status: DC
  Administered 2015-06-25 – 2015-06-26 (×2): 40 mg via SUBCUTANEOUS
  Filled 2015-06-25 (×4): qty 0.4

## 2015-06-25 MED ORDER — FLUTICASONE FUROATE-VILANTEROL 100-25 MCG/INH IN AEPB: 1.0000 | INHALATION_SPRAY | Freq: Every day | RESPIRATORY_TRACT | Status: DC

## 2015-06-25 MED ORDER — CALCIUM CARBONATE ANTACID 500 MG PO CHEW
1.0000 | CHEWABLE_TABLET | Freq: Two times a day (BID) | ORAL | Status: DC
  Administered 2015-06-25 – 2015-06-27 (×4): 200 mg via ORAL
  Filled 2015-06-25 (×6): qty 1

## 2015-06-25 MED ORDER — ROSUVASTATIN CALCIUM 40 MG PO TABS
40.0000 mg | ORAL_TABLET | Freq: Every day | ORAL | Status: DC
  Administered 2015-06-25 – 2015-06-27 (×3): 40 mg via ORAL
  Filled 2015-06-25: qty 2
  Filled 2015-06-25 (×3): qty 1

## 2015-06-25 MED ORDER — SODIUM CHLORIDE 0.9 % IV SOLN
1500.0000 mg | Freq: Once | INTRAVENOUS | Status: AC
  Administered 2015-06-25: 1500 mg via INTRAVENOUS
  Filled 2015-06-25: qty 1500

## 2015-06-25 MED ORDER — ASPIRIN 81 MG PO CHEW
81.0000 mg | CHEWABLE_TABLET | Freq: Every day | ORAL | Status: DC
  Administered 2015-06-26 – 2015-06-27 (×2): 81 mg via ORAL
  Filled 2015-06-25 (×4): qty 1

## 2015-06-25 MED ORDER — FLUTICASONE PROPIONATE 50 MCG/ACT NA SUSP
1.0000 | Freq: Every day | NASAL | Status: DC
  Administered 2015-06-25 – 2015-06-27 (×3): 1 via NASAL
  Filled 2015-06-25: qty 16

## 2015-06-25 MED ORDER — ONDANSETRON HCL 4 MG/2ML IJ SOLN: 4.0000 mg | Freq: Four times a day (QID) | INTRAMUSCULAR | Status: DC | PRN

## 2015-06-25 MED ORDER — IPRATROPIUM-ALBUTEROL 0.5-2.5 (3) MG/3ML IN SOLN
3.0000 mL | Freq: Three times a day (TID) | RESPIRATORY_TRACT | Status: DC
  Administered 2015-06-26: 3 mL via RESPIRATORY_TRACT
  Filled 2015-06-25: qty 3

## 2015-06-25 MED ORDER — ACETAMINOPHEN 325 MG PO TABS: 650.0000 mg | ORAL_TABLET | Freq: Four times a day (QID) | ORAL | Status: DC | PRN

## 2015-06-25 NOTE — Progress Notes (Signed)
ANTIBIOTIC CONSULT NOTE - INITIAL  Pharmacy Consult for vancomycin and aztreonam Indication: pneumonia  Allergies  Allergen Reactions  . Mold Extract [Trichophyton]     Migraine  . Penicillins Other (See Comments)    "drew my legs up as child"  . Ace Inhibitors Cough  . Angiotensin Receptor Blockers Cough  . Vytorin [Ezetimibe-Simvastatin] Other (See Comments)    cramping  . Zetia [Ezetimibe] Other (See Comments)    cramping    Patient Measurements: Height: 5\' 8"  (172.7 cm) Weight: 185 lb (83.915 kg) IBW/kg (Calculated) : 63.9   Vital Signs: Temp: 98.7 F (37.1 C) (07/09 1239) Temp Source: Oral (07/09 1239) BP: 99/59 mmHg (07/09 1404) Pulse Rate: 101 (07/09 1404) Intake/Output from previous day:   Intake/Output from this shift:    Labs:  Recent Labs  06/24/15 1623 06/24/15 1624  WBC  --  14.8*  HGB  --  13.4  CREATININE 0.71  --    Estimated Creatinine Clearance: 80.6 mL/min (by C-G formula based on Cr of 0.71). No results for input(s): VANCOTROUGH, VANCOPEAK, VANCORANDOM, GENTTROUGH, GENTPEAK, GENTRANDOM, TOBRATROUGH, TOBRAPEAK, TOBRARND, AMIKACINPEAK, AMIKACINTROU, AMIKACIN in the last 72 hours.   Microbiology: Recent Results (from the past 720 hour(s))  Urine culture     Status: None   Collection Time: 06/21/15 12:28 PM  Result Value Ref Range Status   Urine Culture, Routine Final report  Final   Result 1 Comment  Final    Comment: Mixed urogenital flora 10,000-25,000 colony forming units per mL     Medical History: Past Medical History  Diagnosis Date  . Arthritis   . Chronic headaches   . Adenomatous colon polyp   . CAD (coronary artery disease)     Dr. Burt Knack follows   . HLD (hyperlipidemia)   . HTN (hypertension)   . Diverticulosis   . IBS (irritable bowel syndrome)   . Obesity   . External hemorrhoids   . Cystocele     Assessment: 24 YOF admitted today with PNA. She was seen by PCP on 7/5 and started on Levaquin for PNA that was  revealed on CXR. She returned to the office on 7/8 with a rash and was switched to a Zpack.  Labs were done yesterday at PCP office- SCr 0.71, est CrCL ~75-65mL/min. WBC elevated at 14.8, patient currently afebrile.  Goal of Therapy:  Vancomycin trough level 15-20 mcg/ml  Plan:  -aztreonam 2g IV q8h -vancomycin 1500mg  IV x1 as loading dose, then 1000mg  IV q12h -follow c/s, clinical progression, renal function, trough PRN  Lyberti Thrush D. Limuel Nieblas, PharmD, BCPS Clinical Pharmacist Pager: [phone] 06/25/2015 2:26 PM

## 2015-06-25 NOTE — Progress Notes (Signed)
NURSING PROGRESS NOTE  Shelly Bennett [id number] Admission Data: 06/25/2015 1:04 PM Attending Provider: Reyne Dumas, MD PYP:PJKDT, Elenore Rota, MD Code Status: Full Allergies:  Mold extract; Penicillins; Ace inhibitors; Angiotensin receptor blockers; Vytorin; and Zetia Past Medical History:   has a past medical history of Arthritis; Chronic headaches; Adenomatous colon polyp; CAD (coronary artery disease); HLD (hyperlipidemia); HTN (hypertension); Diverticulosis; IBS (irritable bowel syndrome); Obesity; External hemorrhoids; and Cystocele. Past Surgical History:   has past surgical history that includes Knee surgery (05/22/2015); Carpal tunnel release; Colonoscopy (12/15/2008); and laproscopy. Social History:   reports that she has never smoked. She has never used smokeless tobacco. She reports that she does not drink alcohol or use illicit drugs.  Shelly Bennett is a 64 y.o. female patient admitted from ED:   Last Documented Vital Signs: Blood pressure 116/62, pulse 95, temperature 98.7 F (37.1 C), temperature source Oral, resp. rate 18, SpO2 99 %.  Cardiac Monitoring:  None ordered.  IV Fluids: IV team consult ordered.  Skin: WDL  Patient/Family orientated to room. Information packet given to patient/family. Admission inpatient armband information verified with patient/family to include name and date of birth and placed on patient arm. Side rails up x 2, fall assessment and education completed with patient/family. Patient/family able to verbalize understanding of risk associated with falls and verbalized understanding to call for assistance before getting out of bed. Call light within reach. Patient/family able to voice and demonstrate understanding of unit orientation instructions.    Will continue to evaluate and treat per MD orders.   Hendricks Limes RN, BS, BSN

## 2015-06-25 NOTE — Progress Notes (Signed)
MD Abrol paged ofnew patient admission to [facility] room 12.

## 2015-06-25 NOTE — H&P (Signed)
[facility] History and Physical                                                                                    Shelly Bennett, is a 64 y.o. female  MRN: [id number]   DOB - [birth year]  Admit Date - 06/25/2015  Outpatient Primary MD for the patient is Redge Gainer, MD  Referring MD: Dr. Laurance Flatten / [facility]  With History of -  Past Medical History  Diagnosis Date  . Arthritis   . Chronic headaches   . Adenomatous colon polyp   . CAD (coronary artery disease)     Dr. Burt Knack follows   . HLD (hyperlipidemia)   . HTN (hypertension)   . Diverticulosis   . IBS (irritable bowel syndrome)   . Obesity   . External hemorrhoids   . Cystocele       Past Surgical History  Procedure Laterality Date  . Knee surgery  05/22/2015    right  . Carpal tunnel release    . Colonoscopy  12/15/2008    diverticulosis, external hemorrhoids  . Laproscopy      in for   No chief complaint on file.    HPI This is a 64 year old female patient who has an underlying history of CAD, mild aortic stenosis, dyslipidemia, hypertension who appears to have failed outpatient therapy for community acquired pneumonia. This is been complicated by presumed allergic drug reaction to either outpatient antibiotics or narcotic component to her cough suppressant. Patient reports that dating back to February of this year the she had fallen and sustained a leg injury. She had tentatively been scheduled for surgery May 5 but developed a pneumonia process therefore surgery was postponed. She eventually had cleared her pneumonia by chest x-ray on May 31 and her surgery was rescheduled. After surgery, which was an outpatient procedure, she did undergo inpatient rehabilitative therapy and reports that she was discharged from the rehabilitation facility last week. Subsequently she developed upper respiratory symptoms with productive cough of green sputum, malaise and fevers/chills and was started on  Levaquin by her primary care physician on 7/5. Her chest x-ray revealed left lower lobe pneumonia.. The following day she was evaluated by her primary pulmonologist Dr. Annamaria Boots. She had also been prescribed antitussive medication with codeine.   She returned to her primary care physician's office on 7/8 with urticarial type reaction of the skin at the waistline and beneath the breasts. The Levaquin was discontinued and she was started on a Z-Pak and instructed to take as little cough medicine with codeine as possible. She was also prescribed topical cortisone and instructed to apply cold packs to the areas of urticaria. She was given a one-time injection intramuscular of methylprednisone 60 mg in the office. She was also instructed to take over-the-counter Benadryl. Patient now reports that the rash has spread to her arms. She attempted to take the 2 pills of the azithromycin as morning but vomited those up. She is overall felt weak and dizzy and had very little appetite. Review of laboratory data obtained at the office on 7/8 shows patient with a new hyponatremia (sodium of 132) noting baseline sodium was  142. She also had persistent leukocytosis although it had decreased from 18,500 to 14,800. Review of outpatient laboratory results show patient was negative for influenza a and B. She continues with significant coughing occasionally paroxysmal at times especially with attempts to vocalize. She also now is reporting redness on her left anterior foot. She's also complaining of generalized muscle aches and arthralgias especially in the bilateral knees. [facility]   Review of Systems   In addition to the HPI above,  No Headache, changes with Vision or hearing, new weakness, tingling, numbness in any extremity, No problems swallowing food or Liquids, indigestion/reflux No Chest pain, palpitations, orthopnea or DOE No Abdominal pain, no melena or hematochezia, no dark tarry  stools, Bowel movements are regular, No dysuria, hematuria or flank pain No new Lesions, masses or bruises, No recent weight gain or loss No polyuria, polydypsia or polyphagia,  *A full 10 point Review of Systems was done, except as stated above, all other Review of Systems were negative.  Social History History  Substance Use Topics  . Smoking status: Never Smoker   . Smokeless tobacco: Never Used  . Alcohol Use: No    Resides at: Private residence  Lives with: Husband  Ambulatory status: Without assistive devices   Family History Family History  Problem Relation Age of Onset  . Prostate cancer Father   . Colon cancer Father 65  . Kidney disease Father   . Hyperlipidemia Mother   . Hypertension Mother   . Kidney disease Mother   . Osteoporosis Mother   . Hyperlipidemia Sister   . Hypertension Sister   . Heart disease Brother 35    not dx questionable   . Heart disease Father   . Heart murmur Mother   . Heart disease Maternal Grandfather      Prior to Admission medications   Medication Sig Start Date End Date Taking? Authorizing Provider  amLODipine (NORVASC) 10 MG tablet TAKE ONE TABLET BY MOUTH ONE  TIME DAILY 12/31/14   Chipper Herb, MD  aspirin 81 MG tablet Take 81 mg by mouth daily.     Historical Provider, MD  azithromycin (ZITHROMAX) 250 MG tablet As directed 06/24/15   Chipper Herb, MD  calcium carbonate 200 MG capsule Take 250 mg by mouth 2 (two) times daily with a meal.    Historical Provider, MD  chlorpheniramine-HYDROcodone (TUSSIONEX PENNKINETIC ER) 10-8 MG/5ML SUER Take 5 mLs by mouth at bedtime as needed for cough. 06/21/15   Chipper Herb, MD  Cholecalciferol (VITAMIN D-3) 5000 UNITS TABS Take 1 capsule by mouth daily. Mon thru Friday    Historical Provider, MD  desonide (DESOWEN) 0.05 % cream Apply 1 application topically as needed (face irritation).  06/05/13   Historical Provider, MD  fexofenadine (ALLEGRA) 180 MG tablet Take 180 mg by mouth  daily.    Historical Provider, MD  Fluticasone Furoate-Vilanterol (BREO ELLIPTA) 100-25 MCG/INH AEPB Inhale 1 puff into the lungs daily. 05/02/15   Chipper Herb, MD  guaiFENesin (MUCINEX) 600 MG 12 hr tablet Take 1,200 mg by mouth. As needed    Historical Provider, MD  hydrochlorothiazide (HYDRODIURIL) 25 MG tablet TAKE ONE TABLET BY MOUTH ONE  TIME DAILY 12/31/14   Chipper Herb, MD  HYDROcodone-acetaminophen (NORCO/VICODIN) 5-325 MG per tablet  05/19/15   Historical Provider, MD  levofloxacin (LEVAQUIN) 500 MG tablet Take 500 mg by mouth daily. 06/21/15   Historical Provider, MD  mometasone (NASONEX)  50 MCG/ACT nasal spray USE TWO SPRAYS EACH NOSTRIL ONCE DAILY. 06/02/15   Chipper Herb, MD  omega-3 acid ethyl esters (LOVAZA) 1 G capsule TAKE ONE CAPSULE BY MOUTH FOUR TIMES DAILY 03/06/15   Chipper Herb, MD  omeprazole (PRILOSEC) 20 MG capsule Take 20 mg by mouth daily. One by mouth Mon Wed Friday    Historical Provider, MD  ondansetron [facility]) 4 MG tablet  05/19/15   Historical Provider, MD  rosuvastatin (CRESTOR) 40 MG tablet Take one po qd 05/17/15   Chipper Herb, MD  rosuvastatin (CRESTOR) 40 MG tablet Take 40 mg by mouth daily. 05/17/15   Historical Provider, MD  Umeclidinium-Vilanterol (ANORO ELLIPTA) 62.5-25 MCG/INH AEPB Inhale 1 puff into the lungs daily. 06/22/15   Deneise Lever, MD  valACYclovir (VALTREX) 1000 MG tablet Take 1 tablet by mouth daily. 08/16/14   Historical Provider, MD    Allergies  Allergen Reactions  . Mold Extract [Trichophyton]     Migraine  . Penicillins Other (See Comments)    "drew my legs up as child"  . Ace Inhibitors Cough  . Angiotensin Receptor Blockers Cough  . Vytorin [Ezetimibe-Simvastatin] Other (See Comments)    cramping  . Zetia [Ezetimibe] Other (See Comments)    cramping    Physical Exam  Vitals  Blood pressure 99/59, pulse 101, temperature 98.7 F (37.1 C), temperature source Oral, resp. rate 18, height 5\' 8"  (1.727 m), weight 185 lb  (83.915 kg), SpO2 100 %.   General:  In no acute distress although she does appear acutely ill   Psych:  Normal affect, Denies Suicidal or Homicidal ideations, Awake Alert, Oriented X 3. Speech and thought patterns are clear and appropriate, no apparent short term memory deficits  Neuro:   No focal neurological deficits, CN II through XII intact, Strength 5/5 all 4 extremities, Sensation intact all 4 extremities.  ENT:  Ears and Eyes appear Normal, Conjunctivae with clear drainage, clear mildly injected PER. Somewhat dry oral mucosa without erythema or exudates.  Neck:  Supple, No lymphadenopathy appreciated  Respiratory:  Symmetrical chest wall movement, Good air movement bilaterally, expiratory crackles left side midfield down. Room Air saturation 99%  Cardiac:  RRR, systolic murmur best heard left sternal border second intercostal space, no LE edema noted, no JVD, No carotid bruits, peripheral pulses palpable at 2+  Abdomen:  Positive bowel sounds, Soft, Non tender, Non distended,  No masses appreciated, no obvious hepatosplenomegaly  Skin:  No Cyanosis, failure Skin Turgor, No Bruise. Small areas of urticarial rash on abdomen below breasts and underneath arms and axilla-there is also a very large what appears to be urticarial lesion on the dorsum of the left foot irregular in shape measuring about 3 x 4 cm and may have an area within this lesion that appears consistent with an insect bite but this is not a definite finding  Extremities: Symmetrical without obvious trauma or injury,  no effusions.  Data Review  CBC  Recent Labs Lab 06/21/15 1234 06/24/15 1624  WBC 18.5* 14.8*  HGB 12.5 13.4  HCT 40.3 40.9  MCV 93.2 91.8  MCH 28.9 30.1  MCHC 31.0* 32.8    Chemistries   Recent Labs Lab 06/21/15 1228 06/24/15 1623  NA 142 132*  K 4.0 4.8  CL 101 94*  CO2 24 21  GLUCOSE 93 103*  BUN 7* 10  CREATININE 0.68 0.71  CALCIUM 9.5 9.2  AST 20 20  ALT 23 22  ALKPHOS 79 74  BILITOT 0.6 0.7    estimated creatinine clearance is 80.6 mL/min (by C-G formula based on Cr of 0.71).  No results for input(s): TSH, T4TOTAL, T3FREE, THYROIDAB in the last 72 hours.  Invalid input(s): FREET3  Coagulation profile No results for input(s): INR, PROTIME in the last 168 hours.  No results for input(s): DDIMER in the last 72 hours.  Cardiac Enzymes No results for input(s): CKMB, TROPONINI, MYOGLOBIN in the last 168 hours.  Invalid input(s): CK  Invalid input(s): POCBNP  Urinalysis    Component Value Date/Time   BILIRUBINUR neg 06/21/2015 1226   PROTEINUR nwf 06/21/2015 1226   UROBILINOGEN negative 06/21/2015 1226   NITRITE neg 06/21/2015 1226   LEUKOCYTESUR Trace* 06/21/2015 1226    Imaging results:   Dg Chest 2 View  06/21/2015   CLINICAL DATA:  Fever of uncertain etiology; history of coronary artery disease aortic stenosis, irritable bowel disease, nonsmoker.  EXAM: CHEST  2 VIEW  COMPARISON:  Chest x-ray of May 17, 2015  FINDINGS: Patchy increased density has redeveloped in the left lower lobe posteriorly. It had cleared on the May 17, 2015 chest x-ray. Elsewhere the lungs are clear. The heart and pulmonary vascularity are normal. The trachea is midline. There is no pleural effusion. The bony thorax is unremarkable.  IMPRESSION: Left lower lobe infiltrate consistent with pneumonia.  Followup PA and lateral chest X-ray is recommended in 3-4 weeks following trial of antibiotic therapy to ensure resolution and exclude underlying malignancy.   Electronically Signed   By: David  Martinique M.D.   On: 06/21/2015 12:48     Assessment & Plan  Principal Problem:   HCAP  -Admit to medical floor -Currently not hypoxic -Supportive care with meds and nonnarcotic antitussives -Broad spectrum antibiotics; with penicillin allergy and recent allergic reaction will utilize Azactam and vancomycin with pharmacy dosing -prn oxygen available -Flutter valve -Repeat two-view chest  x-ray -Laboratory protocol including HIV antibody, urinary legionella and strep antigen -Blood cultures -Please note influenza was already checked and a A & B are negative -Does not appear to be septic at this juncture  Active Problems:   Allergic drug reaction -Received primarily conservative care prior to admission -Have discontinued any offending substances -We'll go ahead and begin Solu-Medrol 60 mg IV every 12 hours for now and consider transitioning to prednisone and his symptoms improved tomorrow -Pepcid 20 mg IV every 12 hours -When necessary IV Benadryl -Monitor skin eruptions (especially the area on the left foot) for improvement    Dehydration with hyponatremia -Sodium was 132 yesterday so we'll repeat electrolytes today -Begin normal saline IV fluid -As precaution check urinalysis and culture -Given generalized myalgias and arthralgias as well as use of statin will check CPK    Hypertension -Current blood pressure is soft so we'll hold on any hypertensive medications including Hydro diarrheal    CAD, native coronary artery -Currently asymptomatic and without chest pain -Holding statin until CPK resulted -Followed as outpatient by Dr. Burt Knack    Aortic valve stenosis, mild -Ensure adequate hydration    Recent knee surgery -Discharged from inpatient rehabilitation facility just over one week ago -PT evaluation    DVT Prophylaxis: Lovenox  Family Communication:  Husband at bedside  Code Status:  Full code  Condition:  Stable  Discharge disposition: Anticipate discharge back to home when pneumonia symptoms resolved  Time spent in minutes : 60      ELLIS,ALLISON L. ANP on 06/25/2015 at 2:30 PM  Between 7am to 7pm - Pager -  [phone]  After 7pm go to [email or web address] - password [email or web address]  And look for the night coverage person covering me after hours  [facility]

## 2015-06-26 DIAGNOSIS — Z889 Allergy status to unspecified drugs, medicaments and biological substances status: Secondary | ICD-10-CM

## 2015-06-26 DIAGNOSIS — E871 Hypo-osmolality and hyponatremia: Secondary | ICD-10-CM

## 2015-06-26 DIAGNOSIS — I350 Nonrheumatic aortic (valve) stenosis: Secondary | ICD-10-CM

## 2015-06-26 DIAGNOSIS — J189 Pneumonia, unspecified organism: Principal | ICD-10-CM

## 2015-06-26 LAB — HIV ANTIBODY (ROUTINE TESTING W REFLEX): HIV Screen 4th Generation wRfx: NONREACTIVE

## 2015-06-26 MED ORDER — DEXTROSE 5 % IV SOLN
500.0000 mg | INTRAVENOUS | Status: DC
  Administered 2015-06-26 – 2015-06-27 (×2): 500 mg via INTRAVENOUS
  Filled 2015-06-26 (×3): qty 500

## 2015-06-26 MED ORDER — IPRATROPIUM-ALBUTEROL 0.5-2.5 (3) MG/3ML IN SOLN
3.0000 mL | Freq: Two times a day (BID) | RESPIRATORY_TRACT | Status: DC
  Administered 2015-06-26 – 2015-06-27 (×2): 3 mL via RESPIRATORY_TRACT
  Filled 2015-06-26 (×2): qty 3

## 2015-06-26 MED ORDER — SACCHAROMYCES BOULARDII 250 MG PO CAPS
250.0000 mg | ORAL_CAPSULE | Freq: Two times a day (BID) | ORAL | Status: DC
  Administered 2015-06-26 – 2015-06-27 (×3): 250 mg via ORAL
  Filled 2015-06-26 (×5): qty 1

## 2015-06-26 MED ORDER — BENZONATATE 100 MG PO CAPS
200.0000 mg | ORAL_CAPSULE | Freq: Three times a day (TID) | ORAL | Status: DC
  Administered 2015-06-26 – 2015-06-27 (×4): 200 mg via ORAL
  Filled 2015-06-26 (×7): qty 2

## 2015-06-26 NOTE — Progress Notes (Signed)
Utilization Review completed. Kojo Liby RN BSN CM 

## 2015-06-26 NOTE — Progress Notes (Signed)
PROGRESS NOTE  Shelly Bennett PJK:[id number] DOB: 09/07/1951 DOA: 06/25/2015 PCP: Redge Gainer, MD  Brief history 64 year old female with a history of hypertension, hyperlipidemia, coronary artery disease, osteoarthritis presenting with cough, malaise, fevers and chills. The patient had an episode of pneumonia in the outpatient setting and was treated with levofloxacin in early May 2016. Repeat chest x-ray on 05/17/2015 revealed clearing of her infiltrate.  Subsequently, the patient had a meniscectomy on her left knee on 05/19/2015.  The patient has been undergoing outpatient physical therapy for her left knee since her surgery.  On 06/10/2015, the patient went to see her primary care physician who placed her on a prednisone taper secondary to bronchitis with bronchospasm.   For a week prior to this admission, the patient developed a productive cough with malaise with fevers and chills. She went to see her primary care physician, Dr. Laurance Flatten, who placed her on levofloxacin which the patient started on 06/22/2015. She was also placed on Breo and codeine. She went to see pulmonology, Dr. Annamaria Boots on 06/23/2015 where her inhaler was changed to [facility], and the patient was placed on a PPI. Unfortunately, the patient developed a rash/urticaria underneath her breasts and went to see her primary care provider on 06/24/2015. Her levofloxacin was discontinued, and the patient was given an injection of Depo-Medrol 60 mg IM.  The patient attempted the azithromycin, but she developed nausea and vomiting and began feeling weak and dizzy. As a result, direct admission was requested. Assessment/Plan: Pneumonia (CAP) -best seen on lateral view on CXR -05/17/15 CXR showed resolution of her previous LLL opacity from her prior episode of PNA -urine legionella antigen -continue vancomycin and aztreonam for now pending culture data -prn anti-tussives -continue albuterol nebs -restart azithromycin--give IV  Fixed  Drug Reaction -Unclear if she developed a reaction to levofloxacin or codeine -Patient received Depo-Medrol on 06/23/25 -improved with 2 doses of solumedrol -d/c IV steroids and observe -no signs of angioedema or respiratory compromise -although pt has listed allergy to PCN, this occurred as a child and pt did not recall reaction -has taken cephalexin in the past without difficulty  Hyponatremia/dehydration/orthostatic hypotension -Second volume depletion -Continue intravenous fluids -Orthostatic vital signs were positive -d/c HCTZ  Hypertension -Discontinue amlodipine as the patient's blood pressure remains soft  Coronary artery disease -Continue aspirin -Restart statin -CPK 64  Left knee meniscectomy -Order physical therapy while the patient is in-house -Patient will need to resume outpatient physical therapy upon discharge  Aortic stenosis -Hemodynamically stable    Family Communication:   Pt at beside Disposition Plan:   Home when medically stable      Procedures/Studies: Dg Chest 2 View  06/25/2015   CLINICAL DATA:  Cough x1 week, pneumonia  EXAM: CHEST  2 VIEW  COMPARISON:  06/21/2015  FINDINGS: Left lower lobe opacity, atelectasis versus pneumonia, improved. No pleural effusion or pneumothorax.  The heart is normal in size.  Visualized osseous structures are within normal limits.  IMPRESSION: Left lower lobe opacity, atelectasis versus pneumonia, improved.   Electronically Signed   By: Julian Hy M.D.   On: 06/25/2015 15:50   Dg Chest 2 View  06/21/2015   CLINICAL DATA:  Fever of uncertain etiology; history of coronary artery disease aortic stenosis, irritable bowel disease, nonsmoker.  EXAM: CHEST  2 VIEW  COMPARISON:  Chest x-ray of May 17, 2015  FINDINGS: Patchy increased density has redeveloped in the left lower lobe posteriorly. It had cleared  on the May 17, 2015 chest x-ray. Elsewhere the lungs are clear. The heart and pulmonary vascularity are normal. The  trachea is midline. There is no pleural effusion. The bony thorax is unremarkable.  IMPRESSION: Left lower lobe infiltrate consistent with pneumonia.  Followup PA and lateral chest X-ray is recommended in 3-4 weeks following trial of antibiotic therapy to ensure resolution and exclude underlying malignancy.   Electronically Signed   By: Trayden Brandy  [place] M.D.   On: 06/21/2015 12:48        Subjective: Patient continues to have non-productive cough. She states that she is breathing somewhat better. Still feels generalized malaise. Denies any hemoptysis, vomiting, diarrhea, abdominal pain, dysuria, hematuria, headache. Rash has improved.  Objective: Filed Vitals:   06/25/15 2048 06/25/15 2201 06/26/15 0548 06/26/15 0720  BP:  117/52 118/56   Pulse:  89 63   Temp:  98 F (36.7 C) 97.8 F (36.6 C)   TempSrc:  Oral Oral   Resp:  20 20   Height:      Weight:      SpO2: 98% 96% 96% 95%    Intake/Output Summary (Last 24 hours) at 06/26/15 0947 Last data filed at 06/26/15 0500  Gross per 24 hour  Intake   2320 ml  Output    500 ml  Net   1820 ml   Weight change:  Exam:   General:  Pt is alert, follows commands appropriately, not in acute distress  HEENT: No icterus, No thrush, No neck mass, /AT; no meningismus  Cardiovascular: RRR, S1/S2, no rubs, no gallops  Respiratory: Right closed auscultation; left basal crackles  Abdomen: Soft/+BS, non tender, non distended, no guarding; no hepatosplenomegaly  Extremities: No edema, No lymphangitis, No petechiae, No rashes, no synovitis; no cyanosis or clubbing  Data Reviewed: Basic Metabolic Panel:  Recent Labs Lab 06/21/15 1228 06/24/15 1623 06/25/15 1621  NA 142 132* 132*  K 4.0 4.8 4.1  CL 101 94* 98*  CO2 24 21 23   GLUCOSE 93 103* 111*  BUN 7* 10 12  CREATININE 0.68 0.71 0.79  CALCIUM 9.5 9.2 8.8*   Liver Function Tests:  Recent Labs Lab 06/21/15 1228 06/24/15 1623 06/25/15 1621  AST 20 20 23   ALT 23 22 25     ALKPHOS 79 74 59  BILITOT 0.6 0.7 0.8  PROT 6.4 5.6* 5.8*  ALBUMIN  --   --  2.6*   No results for input(s): LIPASE, AMYLASE in the last 168 hours. No results for input(s): AMMONIA in the last 168 hours. CBC:  Recent Labs Lab 06/21/15 1234 06/24/15 1624 06/25/15 1621  WBC 18.5* 14.8* 10.1  NEUTROABS  --   --  8.4*  HGB 12.5 13.4 11.8*  HCT 40.3 40.9 34.3*  MCV 93.2 91.8 91.5  PLT  --   --  292   Cardiac Enzymes:  Recent Labs Lab 06/25/15 1612  CKTOTAL 64   BNP: Invalid input(s): POCBNP CBG: No results for input(s): GLUCAP in the last 168 hours.  Recent Results (from the past 240 hour(s))  Urine culture     Status: None   Collection Time: 06/21/15 12:28 PM  Result Value Ref Range Status   Urine Culture, Routine Final report  Final   Result 1 Comment  Final    Comment: Mixed urogenital flora 10,000-25,000 colony forming units per mL      Scheduled Meds: . aspirin  81 mg Oral Daily  . azithromycin  500 mg Intravenous Q24H  . aztreonam  2 g Intravenous 3 times per day  . benzonatate  200 mg Oral TID  . calcium carbonate  1 tablet Oral BID WC  . enoxaparin (LOVENOX) injection  40 mg Subcutaneous Q24H  . famotidine (PEPCID) IV  20 mg Intravenous Q12H  . fluticasone  1 spray Each Nare Daily  . ipratropium-albuterol  3 mL Nebulization TID  . loratadine  10 mg Oral Daily  . omega-3 acid ethyl esters  1 capsule Oral QID  . rosuvastatin  40 mg Oral Daily  . saccharomyces boulardii  250 mg Oral BID  . vancomycin  1,000 mg Intravenous Q12H   Continuous Infusions: . sodium chloride 75 mL/hr at 06/25/15 1500     Shaniah Baltes, DO  [facility] Pager [phone]  If 7PM-7AM, please contact night-coverage [email or web address] Password [email or web address] 06/26/2015, 9:47 AM   LOS: 1 day

## 2015-06-26 NOTE — Evaluation (Signed)
Physical Therapy Evaluation Patient Details Name: Shelly Bennett MRN: [id number] DOB: [birth year] Today's Date: 06/26/2015   History of Present Illness  Pt is a 64 y.o. female adm with HCAP. Pt with recent meniscectomy on Left knee. PMH includes hypertension, hyperlipidemia, coronary artery disease, osteoarthritis.  Clinical Impression  Pt adm due to above. Pt ambulated hallway with supervision (A) with use of rolling walker. Had some difficulties performing high level balance activities. Pt to benefit from skilled acute PT to address high level balance deficits while in acute setting. Upon D/C recommend cont OPPT for follow up on Lt knee and balance deficits.     Follow Up Recommendations Outpatient PT;Supervision - Intermittent    Equipment Recommendations  None recommended by PT    Recommendations for Other Services       Precautions / Restrictions Precautions Precautions: None Restrictions Weight Bearing Restrictions: No      Mobility  Bed Mobility Overal bed mobility: Independent                Transfers Overall transfer level: Independent Equipment used: Rolling walker (2 wheeled)             General transfer comment: no difficulty powering up or balancing  Ambulation/Gait Ambulation/Gait assistance: Supervision Ambulation Distance (Feet): 200 Feet Assistive device: Rolling walker (2 wheeled) Gait Pattern/deviations: Step-through pattern;Decreased stride length Gait velocity: decr Gait velocity interpretation: Below normal speed for age/gender General Gait Details: supervision for safety; mild balance deficits with head turns  Stairs            Wheelchair Mobility    Modified Rankin (Stroke Patients Only)       Balance Overall balance assessment: Needs assistance                           High level balance activites: Head turns;Direction changes High Level Balance Comments: required incr time when performing head turns;  LOB but pt able to regain balance independently              Pertinent Vitals/Pain Pain Assessment: No/denies pain    Home Living Family/patient expects to be discharged to:: Private residence Living Arrangements: Spouse/significant other Available Help at Discharge: Family;Available 24 hours/day Type of Home: House Home Access: Ramped entrance     Home Layout: One level Home Equipment: Walker - 4 wheels Additional Comments: uses rollator PRN    Prior Function Level of Independence: Independent with assistive device(s)               Hand Dominance        Extremity/Trunk Assessment   Upper Extremity Assessment: Defer to OT evaluation           Lower Extremity Assessment: Generalized weakness      Cervical / Trunk Assessment: Normal  Communication   Communication: No difficulties  Cognition Arousal/Alertness: Awake/alert Behavior During Therapy: WFL for tasks assessed/performed Overall Cognitive Status: Within Functional Limits for tasks assessed                      General Comments      Exercises        Assessment/Plan    PT Assessment Patient needs continued PT services  PT Diagnosis Abnormality of gait   PT Problem List Decreased balance;Decreased strength;Decreased mobility  PT Treatment Interventions DME instruction;Gait training;Functional mobility training;Therapeutic activities;Therapeutic exercise;Balance training;Neuromuscular re-education;Patient/family education   PT Goals (Current goals can be found in the Care  Plan section) Acute Rehab PT Goals Patient Stated Goal: to start back on my therapy PT Goal Formulation: With patient Time For Goal Achievement: 07/03/15 Potential to Achieve Goals: Good    Frequency Min 3X/week   Barriers to discharge        Co-evaluation               End of Session Equipment Utilized During Treatment: Gait belt Activity Tolerance: Patient tolerated treatment well Patient left:  in bed;with call bell/phone within reach Nurse Communication: Mobility status         Time: 4268-3419 PT Time Calculation (min) (ACUTE ONLY): 17 min   Charges:   PT Evaluation $Initial PT Evaluation Tier I: 1 Procedure     PT G Codes:        Jay Kempe, [place] N PT  06/26/2015, 11:44 AM

## 2015-06-27 LAB — BASIC METABOLIC PANEL
ANION GAP: 6 (ref 5–15)
BUN: 13 mg/dL (ref 6–20)
CALCIUM: 8.5 mg/dL — AB (ref 8.9–10.3)
CO2: 24 mmol/L (ref 22–32)
CREATININE: 0.63 mg/dL (ref 0.44–1.00)
Chloride: 106 mmol/L (ref 101–111)
GFR calc Af Amer: >60 mL/min (ref >60)
GFR calc non Af Amer: >60 mL/min (ref >60)
Glucose, Bld: 125 mg/dL — ABNORMAL HIGH (ref 65–99)
POTASSIUM: 4.8 mmol/L (ref 3.5–5.1)
SODIUM: 136 mmol/L (ref 135–145)

## 2015-06-27 LAB — URINE CULTURE

## 2015-06-27 LAB — CBC
HCT: 29 % — ABNORMAL LOW (ref 36.0–46.0)
HEMOGLOBIN: 10 g/dL — AB (ref 12.0–15.0)
MCH: 31.3 pg (ref 26.0–34.0)
MCHC: 34.5 g/dL (ref 30.0–36.0)
MCV: 90.9 fL (ref 78.0–100.0)
PLATELETS: 262 10*3/uL (ref 150–400)
RBC: 3.19 MIL/uL — AB (ref 3.87–5.11)
RDW: 13.3 % (ref 11.5–15.5)
WBC: 9.5 10*3/uL (ref 4.0–10.5)

## 2015-06-27 LAB — LEGIONELLA ANTIGEN, URINE

## 2015-06-27 MED ORDER — BENZONATATE 200 MG PO CAPS: 200.0000 mg | ORAL_CAPSULE | Freq: Three times a day (TID) | ORAL | Status: DC

## 2015-06-27 MED ORDER — CEFDINIR 300 MG PO CAPS: 300.0000 mg | ORAL_CAPSULE | Freq: Two times a day (BID) | ORAL | Status: DC

## 2015-06-27 MED ORDER — AZITHROMYCIN 500 MG PO TABS: 500.0000 mg | ORAL_TABLET | Freq: Every day | ORAL | Status: DC

## 2015-06-27 NOTE — Progress Notes (Signed)
Physical Therapy Treatment Patient Details Name: Shelly Bennett MRN: [id number] DOB: 17-Feb-1951 Today's Date: 06/27/2015    History of Present Illness Pt is a 64 y.o. female adm with HCAP. Pt with recent meniscectomy on Left knee. PMH includes hypertension, hyperlipidemia, coronary artery disease, osteoarthritis.    PT Comments    Pt mobilizing well today. No LOB noted during session. Pt continues to ambulate with rolling walker for incr confidence. Safe from PT standpoint to D/C home today with husband.   Follow Up Recommendations  Outpatient PT;Supervision - Intermittent     Equipment Recommendations  None recommended by PT    Recommendations for Other Services       Precautions / Restrictions Precautions Precautions: None Restrictions Weight Bearing Restrictions: No    Mobility  Bed Mobility Overal bed mobility: Independent                Transfers Overall transfer level: Independent Equipment used: Rolling walker (2 wheeled)             General transfer comment: no LOB noted  Ambulation/Gait Ambulation/Gait assistance: Supervision Ambulation Distance (Feet): 300 Feet Assistive device: Rolling walker (2 wheeled) Gait Pattern/deviations: Step-through pattern;Decreased stride length   Gait velocity interpretation: at or above normal speed for age/gender General Gait Details: supervision for safety; no LOB noted this session; continues to feel more confident and incr gait speed with use of rolling walker   Stairs            Wheelchair Mobility    Modified Rankin (Stroke Patients Only)       Balance Overall balance assessment: No apparent balance deficits (not formally assessed)                           High level balance activites: Head turns;Turns;Direction changes High Level Balance Comments: no LOB noted this session     Cognition Arousal/Alertness: Awake/alert Behavior During Therapy: WFL for tasks  assessed/performed Overall Cognitive Status: Within Functional Limits for tasks assessed                      Exercises      General Comments        Pertinent Vitals/Pain Pain Assessment: No/denies pain    Home Living                      Prior Function            PT Goals (current goals can now be found in the care plan section) Acute Rehab PT Goals Patient Stated Goal: to feel better and quit coughing PT Goal Formulation: With patient Time For Goal Achievement: 07/03/15 Potential to Achieve Goals: Good Progress towards PT goals: Progressing toward goals    Frequency  Min 3X/week    PT Plan Current plan remains appropriate    Co-evaluation             End of Session Equipment Utilized During Treatment: Gait belt Activity Tolerance: Patient tolerated treatment well Patient left: with call bell/phone within reach;in bed     Time: 1030-1045 PT Time Calculation (min) (ACUTE ONLY): 15 min  Charges:  $Gait Training: 8-22 mins                    G Codes:      Joeanna Howdyshell, [place] N PT   06/27/2015, 11:00 AM

## 2015-06-27 NOTE — Progress Notes (Addendum)
NURSING PROGRESS NOTE  Shelly Bennett [id number] Discharge Data: 06/27/2015 11:37 AM Attending Provider: Orson Eva, MD BVQ:[id number], Elenore Rota, MD   [place] Coach to be D/C'd Home with husband per MD order via wheelchair by volunteer services, with hard copies of all prescriptions sent to patient pharmacy    All IV's will be discontinued and monitored for bleeding.  All belongings will be returned to patient for patient to take home.  Last Documented Vital Signs:  Blood pressure 99/54, pulse 65, temperature 97.9 F (36.6 C), temperature source Oral, resp. rate 20, height 5\' 8"  (1.727 m), weight 83.915 kg (185 lb), SpO2 98 %.  Hendricks Limes RN, BS, BSN

## 2015-06-27 NOTE — Care Management Note (Signed)
Case Management Note  Patient Details  Name: Shelly Bennett MRN: [id number] Date of Birth: [birth year]  Subjective/Objective:                   Patient states that she has 12 outpatient PT visits and has only used 5. She states that she will call to start her physical therapy next week. She denies any further needs from CM for discharge. Action/Plan:   Expected Discharge Date:                  Expected Discharge Plan:  Home/Self Care  In-House Referral:  Clinical Social Work  Discharge planning Services  CM Consult  Post Acute Care Choice:    Choice offered to:  Patient  DME Arranged:    DME Agency:     HH Arranged:    [place] Agency:     Status of Service:  Completed, signed off  Medicare Important Message Given:    Date Medicare IM Given:    Medicare IM give by:    Date Additional Medicare IM Given:    Additional Medicare Important Message give by:     If discussed at [place] of Stay Meetings, dates discussed:    Additional Comments:  Carles Collet, RN 06/27/2015, 11:03 AM

## 2015-06-27 NOTE — Progress Notes (Signed)
Physician Discharge Summary  Shelly Bennett RWE:[id number] DOB: Apr 28, 1951 DOA: 06/25/2015  PCP: Redge Gainer, MD  Admit date: 06/25/2015 Discharge date: 06/27/2015  Recommendations for Outpatient Follow-up:  1. Pt will need to follow up with PCP in 2 weeks post discharge 2. Please obtain CBC in 1 week 3. Please follow-up on urine legionella antigen--if it is positive, the patient will need 21 days of azithromycin   Discharge Diagnoses:  Pneumonia (CAP) -best seen on lateral view on CXR -05/17/15 CXR showed resolution of her previous LLL opacity from her prior episode of PNA -urine legionella antigen--pending at time of discharge -continue vancomycin and aztreonam--> discontinue as to patient's blood cultures are negative -prn anti-tussives -continue albuterol nebs -restart azithromycin--given IV -Patient will go home with azithromycin for 3 additional days to finish 5 days of therapy -Patient will go home with cefdinir 300mg  bid x 6 days to finish 7 days of therapy  Fixed Drug Reaction -Unclear if she developed a reaction to levofloxacin or codeine -Patient received Depo-Medrol on 06/23/25 -improved with 2 doses of solumedrol -d/c IV steroids and observe--> remained stable off of IV steroid24 hours -no signs of angioedema or respiratory compromise -although pt has listed allergy to PCN, this occurred as a child and pt did not recall reaction -has taken cephalexin in the past without difficulty  Hyponatremia/dehydration/orthostatic hypotension -Second volume depletion -Continue intravenous fluids-->Na improved -Orthostatic vital signs were positive initially -Repeat orthostatic vital signs on the day of discharge were negative  Hypertension -Patient's blood pressure has been stable off of amlodipine and HCTZ -Instructed the patient to remain off of her antihypertensives agents -Instructed patient to continue keeping a blood pressure log with which she will take to her  primary care provider to determine whether she needs to restart her medications -Patient to patient to call her family physician if she has consistent blood pressure readings 160/100 or greater  Coronary artery disease -Continue aspirin -Restart statin -CPK 64 -Stable without any chest discomfort  Left knee meniscectomy -Order physical therapy while the patient is in-house -Patient will need to resume outpatient physical therapy upon discharge  Aortic stenosis -Hemodynamically stable  Discharge Condition: home  Disposition: home  Diet:heart healthy Wt Readings from Last 3 Encounters:  06/25/15 83.915 kg (185 lb)  06/24/15 84.823 kg (187 lb)  06/22/15 85.186 kg (187 lb 12.8 oz)    History of present illness:  64 year old female with a history of hypertension, hyperlipidemia, coronary artery disease, osteoarthritis presenting with cough, malaise, fevers and chills. The patient had an episode of pneumonia in the outpatient setting and was treated with levofloxacin in early May 2016. Repeat chest x-ray on 05/17/2015 revealed clearing of her infiltrate. Subsequently, the patient had a meniscectomy on her left knee on 05/19/2015. The patient has been undergoing outpatient physical therapy for her left knee since her surgery. On 06/10/2015, the patient went to see her primary care physician who placed her on a prednisone taper secondary to bronchitis with bronchospasm. For a week prior to this admission, the patient developed a productive cough with malaise with fevers and chills. She went to see her primary care physician, Dr. Laurance Flatten, who placed her on levofloxacin which the patient started on 06/22/2015. She was also placed on Breo and codeine. She went to see pulmonology, Dr. Annamaria Boots on 06/23/2015 where her inhaler was changed to [facility], and the patient was placed on a PPI. Unfortunately, the patient developed a rash/urticaria underneath her breasts and went to see her primary care provider  on 06/24/2015. Her levofloxacin was discontinued, and the patient was given an injection of Depo-Medrol 60 mg IM. The patient attempted the azithromycin, but she developed nausea and vomiting and began feeling weak and dizzy. As a result, direct admission was requested.    Discharge Exam: Filed Vitals:   06/27/15 0533  BP: 99/54  Pulse: 65  Temp: 97.9 F (36.6 C)  Resp: 20   Filed Vitals:   06/26/15 1413 06/26/15 1954 06/26/15 2115 06/27/15 0533  BP: 112/83  113/70 99/54  Pulse: 83  84 65  Temp: 98.3 F (36.8 C)  98.2 F (36.8 C) 97.9 F (36.6 C)  TempSrc: Oral  Oral Oral  Resp: 18   20  Height:      Weight:      SpO2: 99% 99% 99% 98%   General: A&O x 3, NAD, pleasant, cooperative Cardiovascular: RRR, no rub, no gallop, no S3 Respiratory: Left basilar crackles.Right clear to Auscultation.  Abdomen:soft, nontender, nondistended, positive bowel sounds Extremities: No edema, No lymphangitis, no petechiae  Discharge Instructions      Discharge Instructions    Diet - low sodium heart healthy    Complete by:  As directed      Discharge instructions    Complete by:  As directed   Check your blood pressure daily and keep a log with which to take to your primary care doctor. Do not take amlodipine or HCTZ until you follow up with your primary care doctor Call you primary care doctor if your blood pressure checks are above 160/100 consistently     Increase activity slowly    Complete by:  As directed             Medication List    STOP taking these medications        amLODipine 10 MG tablet  Commonly known as:  NORVASC     chlorpheniramine-HYDROcodone 10-8 MG/5ML Suer  Commonly known as:  TUSSIONEX PENNKINETIC ER     fluticasone 50 MCG/ACT nasal spray  Commonly known as:  FLONASE     hydrochlorothiazide 25 MG tablet  Commonly known as:  HYDRODIURIL     hydrocortisone cream 1 %     levofloxacin 500 MG tablet  Commonly known as:  LEVAQUIN     MUCINEX 600  MG 12 hr tablet  Generic drug:  guaiFENesin      TAKE these medications        aspirin 81 MG tablet  Take 81 mg by mouth daily.     azithromycin 500 MG tablet  Commonly known as:  ZITHROMAX  Take 1 tablet (500 mg total) by mouth daily. Start 06/28/15  Start taking on:  06/28/2015     benzonatate 200 MG capsule  Commonly known as:  TESSALON  Take 1 capsule (200 mg total) by mouth 3 (three) times daily.     calcium carbonate 200 MG capsule  Take 250 mg by mouth 2 (two) times daily.     cefdinir 300 MG capsule  Commonly known as:  OMNICEF  Take 1 capsule (300 mg total) by mouth 2 (two) times daily.     desonide 0.05 % cream  Commonly known as:  DESOWEN  Apply 1 application topically as needed (face irritation).     fexofenadine 180 MG tablet  Commonly known as:  ALLEGRA  Take 180 mg by mouth daily.     HYDROcodone-acetaminophen 5-325 MG per tablet  Commonly known as:  NORCO/VICODIN  Take 0.5 tablets by mouth  at bedtime as needed for moderate pain.     mometasone 50 MCG/ACT nasal spray  Commonly known as:  NASONEX  USE TWO SPRAYS EACH NOSTRIL ONCE DAILY.     omega-3 acid ethyl esters 1 G capsule  Commonly known as:  LOVAZA  TAKE ONE CAPSULE BY MOUTH FOUR TIMES DAILY     omeprazole 20 MG capsule  Commonly known as:  PRILOSEC  Take 20 mg by mouth daily. One by mouth Mon Wed Friday     ondansetron 4 MG tablet  Commonly known as:  ZOFRAN  Take 4 mg by mouth every 8 (eight) hours as needed for nausea or vomiting.     rosuvastatin 40 MG tablet  Commonly known as:  CRESTOR  Take one po qd     Umeclidinium-Vilanterol 62.5-25 MCG/INH Aepb  Commonly known as:  ANORO ELLIPTA  Inhale 1 puff into the lungs daily.     valACYclovir 1000 MG tablet  Commonly known as:  VALTREX  Take 1 tablet by mouth daily.     Vitamin D-3 5000 UNITS Tabs  Take 1 capsule by mouth daily. Mon thru Friday         The results of significant diagnostics from this hospitalization (including  imaging, microbiology, ancillary and laboratory) are listed below for reference.    Significant Diagnostic Studies: Dg Chest 2 View  06/25/2015   CLINICAL DATA:  Cough x1 week, pneumonia  EXAM: CHEST  2 VIEW  COMPARISON:  06/21/2015  FINDINGS: Left lower lobe opacity, atelectasis versus pneumonia, improved. No pleural effusion or pneumothorax.  The heart is normal in size.  Visualized osseous structures are within normal limits.  IMPRESSION: Left lower lobe opacity, atelectasis versus pneumonia, improved.   Electronically Signed   By: Julian Hy M.D.   On: 06/25/2015 15:50   Dg Chest 2 View  06/21/2015   CLINICAL DATA:  Fever of uncertain etiology; history of coronary artery disease aortic stenosis, irritable bowel disease, nonsmoker.  EXAM: CHEST  2 VIEW  COMPARISON:  Chest x-ray of May 17, 2015  FINDINGS: Patchy increased density has redeveloped in the left lower lobe posteriorly. It had cleared on the May 17, 2015 chest x-ray. Elsewhere the lungs are clear. The heart and pulmonary vascularity are normal. The trachea is midline. There is no pleural effusion. The bony thorax is unremarkable.  IMPRESSION: Left lower lobe infiltrate consistent with pneumonia.  Followup PA and lateral chest X-ray is recommended in 3-4 weeks following trial of antibiotic therapy to ensure resolution and exclude underlying malignancy.   Electronically Signed   By: Tydus Sanmiguel  [place] M.D.   On: 06/21/2015 12:48     Microbiology: Recent Results (from the past 240 hour(s))  Urine culture     Status: None   Collection Time: 06/21/15 12:28 PM  Result Value Ref Range Status   Urine Culture, Routine Final report  Final   Result 1 Comment  Final    Comment: Mixed urogenital flora 10,000-25,000 colony forming units per mL   Culture, blood (routine x 2) Call MD if unable to obtain prior to antibiotics being given     Status: None (Preliminary result)   Collection Time: 06/25/15  4:21 PM  Result Value Ref Range Status    Specimen Description BLOOD BLOOD RIGHT ARM  Final   Special Requests BOTTLES DRAWN AEROBIC AND ANAEROBIC 10CC  Final   Culture NO GROWTH < 24 HOURS  Final   Report Status PENDING  Incomplete  Urine culture  Status: None (Preliminary result)   Collection Time: 06/25/15  4:28 PM  Result Value Ref Range Status   Specimen Description URINE, CLEAN CATCH  Final   Special Requests NONE  Final   Culture CULTURE REINCUBATED FOR BETTER GROWTH  Final   Report Status PENDING  Incomplete  Culture, blood (routine x 2) Call MD if unable to obtain prior to antibiotics being given     Status: None (Preliminary result)   Collection Time: 06/25/15  4:35 PM  Result Value Ref Range Status   Specimen Description BLOOD BLOOD RIGHT ARM  Final   Special Requests BOTTLES DRAWN AEROBIC AND ANAEROBIC 10CC  Final   Culture NO GROWTH < 24 HOURS  Final   Report Status PENDING  Incomplete     Labs: Basic Metabolic Panel:  Recent Labs Lab 06/21/15 1228 06/24/15 1623 06/25/15 1621 06/27/15 0509  NA 142 132* 132* 136  K 4.0 4.8 4.1 4.8  CL 101 94* 98* 106  CO2 24 21 23 24   GLUCOSE 93 103* 111* 125*  BUN 7* 10 12 13   CREATININE 0.68 0.71 0.79 0.63  CALCIUM 9.5 9.2 8.8* 8.5*   Liver Function Tests:  Recent Labs Lab 06/21/15 1228 06/24/15 1623 06/25/15 1621  AST 20 20 23   ALT 23 22 25   ALKPHOS 79 74 59  BILITOT 0.6 0.7 0.8  PROT 6.4 5.6* 5.8*  ALBUMIN  --   --  2.6*   No results for input(s): LIPASE, AMYLASE in the last 168 hours. No results for input(s): AMMONIA in the last 168 hours. CBC:  Recent Labs Lab 06/21/15 1234 06/24/15 1624 06/25/15 1621 06/27/15 0509  WBC 18.5* 14.8* 10.1 9.5  NEUTROABS  --   --  8.4*  --   HGB 12.5 13.4 11.8* 10.0*  HCT 40.3 40.9 34.3* 29.0*  MCV 93.2 91.8 91.5 90.9  PLT  --   --  292 262   Cardiac Enzymes:  Recent Labs Lab 06/25/15 1612  CKTOTAL 64   BNP: Invalid input(s): POCBNP CBG: No results for input(s): GLUCAP in the last 168  hours.  Time coordinating discharge:  Greater than 30 minutes  Signed:  Malyn Aytes, DO [facility] Pager: [phone] 06/27/2015, 9:22 AM

## 2015-06-30 LAB — CULTURE, BLOOD (ROUTINE X 2)
CULTURE: NO GROWTH
Culture: NO GROWTH

## 2015-07-01 ENCOUNTER — Ambulatory Visit: Payer: BC Managed Care – PPO | Attending: Orthopaedic Surgery | Admitting: Physical Therapy

## 2015-07-01 DIAGNOSIS — M25662 Stiffness of left knee, not elsewhere classified: Secondary | ICD-10-CM | POA: Diagnosis present

## 2015-07-01 DIAGNOSIS — M25562 Pain in left knee: Secondary | ICD-10-CM | POA: Diagnosis not present

## 2015-07-01 NOTE — Therapy (Signed)
[facility] [place], [place], [address] Phone: [phone]   Fax:  [phone]  Physical Therapy Treatment  Patient Details  Name: Shelly Bennett MRN: [id number] Date of Birth: 11-18-1951 Referring Provider:  Chipper Herb, MD  Encounter Date: 07/01/2015      PT End of Session - 07/01/15 0913    Visit Number 6   Number of Visits 12   Date for PT Re-Evaluation 07/18/15   PT Start Time 0913   PT Stop Time 0947   PT Time Calculation (min) 34 min   Activity Tolerance Patient tolerated treatment well   Behavior During Therapy [facility] for tasks assessed/performed      Past Medical History  Diagnosis Date  . Arthritis   . Chronic headaches   . Adenomatous colon polyp   . CAD (coronary artery disease)     Dr. Burt Knack follows   . HLD (hyperlipidemia)   . HTN (hypertension)   . Diverticulosis   . IBS (irritable bowel syndrome)   . Obesity   . External hemorrhoids   . Cystocele     Past Surgical History  Procedure Laterality Date  . Knee surgery  05/22/2015    right  . Carpal tunnel release    . Colonoscopy  12/15/2008    diverticulosis, external hemorrhoids  . Laproscopy      There were no vitals filed for this visit.  Visit Diagnosis:  Left knee pain  Knee stiffness, left      Subjective Assessment - 07/01/15 0913    Subjective Patient was admitted to hospital 06/25/15 for two days with pneumonia. She has been resting at home since but is very weak. Patient continues to have left knee stiffness.   Currently in Pain? No/denies            [facility] PT Assessment - 07/01/15 0001    Assessment   Medical Diagnosis Left lat/med meniscectomies.   Onset Date/Surgical Date 05/19/15   AROM   AROM Assessment Site Knee   Right/Left Knee Left   Left Knee Extension 0   Left Knee Flexion 118  sitting   Strength   Overall Strength Comments Lt knee ext 5-/5, flex 4/5                     [facility]  Adult PT Treatment/Exercise - 07/01/15 0001    Knee/Hip Exercises: Stretches   Gastroc Stretch Left;2 reps;30 seconds   Knee/Hip Exercises: Aerobic   Nustep L4 x 10 min   Knee/Hip Exercises: Standing   Forward Step Up Left;3 sets;10 reps;Step Height: 4"   Forward Step Up Limitations 6 inch was too difficult today, pt felt unstable and required bil UE support   SLS bil multiple trials Rt 30 sec and left 14 seconds logngest hold ; barefoot                PT Education - 07/01/15 1235    Education provided Yes   Education Details HEP - add SLS barefoot daily   Person(s) Educated Patient   Methods Explanation;Demonstration   Comprehension Verbalized understanding;Returned demonstration             PT Long Term Goals - 07/01/15 1241    PT LONG TERM GOAL #1   Title Ind with HEP.   Time 4   Period Weeks   Status Achieved   PT LONG TERM GOAL #2   Title Active left knee flexion to 125 degrees to increase  function.   Time 4   Period Weeks   Status On-going   PT LONG TERM GOAL #3   Title 5/5 left knee strength to increase stability for functional tasks.   Time 4   Period Weeks   Status On-going   PT LONG TERM GOAL #4   Title Walk a community distance without assistive device and pain not > 3/10.   Time 4   Period Weeks   Status On-going               Plan - 07/01/15 1236    Clinical Impression Statement Patient returned to PT today s/p hospital stay for pneumonia. She wore a mask and c/o of fatigue and weakness, but tolerated therex well without requiring more than one 30 sec rest. Patient has balance deficits on the left but was able to increase SLS during treatment from 7 to 15 seconds. Her ROM has increased since last assessment as well. She is progressing toward goals.    PT Next Visit Plan Gradually increase therex as tolerated by pt. (MD Durward Fortes 07/13/15)        Problem List Patient Active Problem List   Diagnosis Date Noted  . Allergic drug  reaction 06/25/2015  . Dehydration with hyponatremia 06/25/2015  . HCAP (healthcare-associated pneumonia) 06/25/2015  . Vitamin D deficiency 08/19/2014  . Family history of colon cancer-father at 38+ grandparent w/ rectal cancer 01/14/2014  . Osteopenia 08/12/2013  . CAD (coronary artery disease), native coronary artery 03/22/2013  . Aortic valve stenosis, mild 03/22/2013  . Hyperlipemia 03/11/2013  . Hypertension 03/11/2013  . Allergic rhinitis 03/11/2013  . Left shoulder pain 03/11/2013  . IBS (irritable bowel syndrome) - with chronic recurrent abdominal pain 02/01/2012  . Personal history of colonic polyps - adenoma 02/01/2012  . Coccydynia 02/01/2012    Madelyn Flavors PT  07/01/2015, 12:43 PM  [facility] [address] [place], [place], [address] Phone: [phone]   Fax:  [phone]

## 2015-07-04 ENCOUNTER — Ambulatory Visit: Payer: BC Managed Care – PPO | Admitting: Physical Therapy

## 2015-07-04 DIAGNOSIS — M25562 Pain in left knee: Secondary | ICD-10-CM | POA: Diagnosis not present

## 2015-07-04 DIAGNOSIS — M25662 Stiffness of left knee, not elsewhere classified: Secondary | ICD-10-CM

## 2015-07-04 NOTE — Therapy (Signed)
[facility] [place], [place], [address] Phone: [phone]   Fax:  [phone]  Physical Therapy Treatment  Patient Details  Name: Shelly Bennett MRN: [id number] Date of Birth: [birth year] Referring Provider:  Chipper Herb, MD  Encounter Date: 07/04/2015      PT End of Session - 07/04/15 1123    Visit Number 7   Number of Visits 12   Date for PT Re-Evaluation 07/18/15   PT Start Time 1122   PT Stop Time 1213   PT Time Calculation (min) 51 min   Activity Tolerance Patient tolerated treatment well   Behavior During Therapy [facility] for tasks assessed/performed      Past Medical History  Diagnosis Date  . Arthritis   . Chronic headaches   . Adenomatous colon polyp   . CAD (coronary artery disease)     Dr. Burt Knack follows   . HLD (hyperlipidemia)   . HTN (hypertension)   . Diverticulosis   . IBS (irritable bowel syndrome)   . Obesity   . External hemorrhoids   . Cystocele     Past Surgical History  Procedure Laterality Date  . Knee surgery  05/22/2015    right  . Carpal tunnel release    . Colonoscopy  12/15/2008    diverticulosis, external hemorrhoids  . Laproscopy      There were no vitals filed for this visit.  Visit Diagnosis:  Left knee pain  Knee stiffness, left      Subjective Assessment - 07/04/15 1124    Subjective Patient feeling better today. She states she finished up her antibiotic today.    Currently in Pain? No/denies                         [facility] Adult PT Treatment/Exercise - 07/04/15 0001    Knee/Hip Exercises: Stretches   Gastroc Stretch Left;2 reps;30 seconds   Knee/Hip Exercises: Aerobic   Nustep L5 x 10 min   Knee/Hip Exercises: Machines for Strengthening   Cybex Leg Press 1 plate 3 x 10  some pain in the middle of knee with eccentric motion   Knee/Hip Exercises: Standing   Lateral Step Up Left;3 sets;10 reps;Hand Hold: 2   Forward Step Up Left;2  sets;10 reps;Hand Hold: 0;Step Height: 4"  20 reps x 6 inch   Functional Squat Limitations decline squat on bosu x 10  difficult   Rocker Board 3 minutes  on bosu; flat side up   Modalities   Modalities Cryotherapy;Electrical Stimulation   Cryotherapy   Number Minutes Cryotherapy 15 Minutes   Cryotherapy Location Knee   Type of Cryotherapy Ice pack   Electrical Stimulation   Electrical Stimulation Location Left knee.   Electrical Stimulation Action IFC   Electrical Stimulation Parameters 80-150Hz    Electrical Stimulation Goals Pain                     PT Long Term Goals - 07/01/15 1241    PT LONG TERM GOAL #1   Title Ind with HEP.   Time 4   Period Weeks   Status Achieved   PT LONG TERM GOAL #2   Title Active left knee flexion to 125 degrees to increase function.   Time 4   Period Weeks   Status On-going   PT LONG TERM GOAL #3   Title 5/5 left knee strength to increase stability for functional tasks.   Time 4  Period Weeks   Status On-going   PT LONG TERM GOAL #4   Title Walk a community distance without assistive device and pain not > 3/10.   Time 4   Period Weeks   Status On-going               Plan - 07/04/15 1202    Clinical Impression Statement Patient tolerated treatment well today with less cardio fatigue than last visit. She experiences some increased pain in the left knee with eccentric quad activities. She did well with balance. She still lacks confidence in the left knee.   PT Next Visit Plan continue LE strenghthening, eccentric quad strengthening and balance   Consulted and Agree with Plan of Care Patient        Problem List Patient Active Problem List   Diagnosis Date Noted  . Allergic drug reaction 06/25/2015  . Dehydration with hyponatremia 06/25/2015  . HCAP (healthcare-associated pneumonia) 06/25/2015  . Vitamin D deficiency 08/19/2014  . Family history of colon cancer-father at 7+ grandparent w/ rectal cancer  01/14/2014  . Osteopenia 08/12/2013  . CAD (coronary artery disease), native coronary artery 03/22/2013  . Aortic valve stenosis, mild 03/22/2013  . Hyperlipemia 03/11/2013  . Hypertension 03/11/2013  . Allergic rhinitis 03/11/2013  . Left shoulder pain 03/11/2013  . IBS (irritable bowel syndrome) - with chronic recurrent abdominal pain 02/01/2012  . Personal history of colonic polyps - adenoma 02/01/2012  . Coccydynia 02/01/2012    Madelyn Flavors PT  07/04/2015, 12:07 PM  [facility] [address] [place], [place], [address] Phone: [phone]   Fax:  [phone]

## 2015-07-06 ENCOUNTER — Ambulatory Visit: Payer: BC Managed Care – PPO | Admitting: Physical Therapy

## 2015-07-06 DIAGNOSIS — M25562 Pain in left knee: Secondary | ICD-10-CM

## 2015-07-06 DIAGNOSIS — M25662 Stiffness of left knee, not elsewhere classified: Secondary | ICD-10-CM

## 2015-07-06 NOTE — Therapy (Signed)
[facility] [place], [place], [address] Phone: [phone]   Fax:  [phone]  Physical Therapy Treatment  Patient Details  Name: Shelly Bennett MRN: [id number] Date of Birth: [birth year] Referring Provider:  Chipper Herb, MD  Encounter Date: 07/06/2015      PT End of Session - 07/06/15 1049    Visit Number 8   Number of Visits 12   Date for PT Re-Evaluation 07/18/15   PT Start Time 0952   PT Stop Time 1045   PT Time Calculation (min) 53 min   Equipment Utilized During Treatment Gait belt   Activity Tolerance Patient tolerated treatment well   Behavior During Therapy [facility] for tasks assessed/performed      Past Medical History  Diagnosis Date  . Arthritis   . Chronic headaches   . Adenomatous colon polyp   . CAD (coronary artery disease)     Dr. Burt Knack follows   . HLD (hyperlipidemia)   . HTN (hypertension)   . Diverticulosis   . IBS (irritable bowel syndrome)   . Obesity   . External hemorrhoids   . Cystocele     Past Surgical History  Procedure Laterality Date  . Knee surgery  05/22/2015    right  . Carpal tunnel release    . Colonoscopy  12/15/2008    diverticulosis, external hemorrhoids  . Laproscopy      There were no vitals filed for this visit.  Visit Diagnosis:  Left knee pain  Knee stiffness, left      Subjective Assessment - 07/06/15 1026    Subjective Knee feeling better today.   Limitations Walking   How long can you walk comfortably? 20 minutes.   Patient Stated Goals Get out of pain.   Pain Score 2    Pain Location Knee   Pain Orientation Left   Pain Descriptors / Indicators Sore   Pain Type Surgical pain   Pain Onset More than a month ago   Aggravating Factors  Increased activity.   Pain Relieving Factors Rest.            [facility] PT Assessment - 07/06/15 0001    AROM   Overall AROM Comments 0 to 128 degrees of left knee flexion.                      [place] Adult PT Treatment/Exercise - 07/06/15 0001    Exercises   Exercises Knee/Hip   Knee/Hip Exercises: Aerobic   Stationary Bike Level 2 x 5 minutes and 10 minutes at level 1 (total 15 minutes).   Modalities   Modalities Cryotherapy   Cryotherapy   Number Minutes Cryotherapy 15 Minutes   Type of Cryotherapy --  Medium vasopneumatic.   Acupuncturist Location eft knee.   Electrical Stimulation Action Constant Pre-Mod E' Stim at 80-150 HZ x 15 minutes.   Electrical Stimulation Goals Pain   Manual Therapy   Passive ROM --  PROM/stretching x 8 minutes.                     PT Long Term Goals - 07/06/15 1033    PT LONG TERM GOAL #1   Title Ind with HEP.   Time 4   Period Weeks   Status Achieved   PT LONG TERM GOAL #2   Title Active left knee flexion to 125 degrees to increase function.   Time 4   Period  Weeks   Status Achieved   PT LONG TERM GOAL #3   Title 5/5 left knee strength to increase stability for functional tasks.   Time 4   Period Weeks   Status On-going   PT LONG TERM GOAL #4   Title Walk a community distance without assistive device and pain not > 3/10.   Time 4   Period Weeks   Status On-going               Plan - 07/06/15 1032    Clinical Impression Statement Patient progressing well with physical therapy reporting a low pain-level today.   Pt will benefit from skilled therapeutic intervention in order to improve on the following deficits Pain;Decreased activity tolerance;Abnormal gait;Decreased range of motion;Decreased strength   Rehab Potential Excellent   PT Frequency 3x / week   PT Duration 4 weeks   PT Treatment/Interventions ADLs/Self Care Home Management;Cryotherapy;Health visitor;Neuromuscular re-education;Therapeutic exercise;Therapeutic activities;Patient/family education;Passive range of motion   PT Next Visit Plan Pain-free left quadriceps strengthening.    Consulted and Agree with Plan of Care Patient        Problem List Patient Active Problem List   Diagnosis Date Noted  . Allergic drug reaction 06/25/2015  . Dehydration with hyponatremia 06/25/2015  . HCAP (healthcare-associated pneumonia) 06/25/2015  . Vitamin D deficiency 08/19/2014  . Family history of colon cancer-father at 73+ grandparent w/ rectal cancer 01/14/2014  . Osteopenia 08/12/2013  . CAD (coronary artery disease), native coronary artery 03/22/2013  . Aortic valve stenosis, mild 03/22/2013  . Hyperlipemia 03/11/2013  . Hypertension 03/11/2013  . Allergic rhinitis 03/11/2013  . Left shoulder pain 03/11/2013  . IBS (irritable bowel syndrome) - with chronic recurrent abdominal pain 02/01/2012  . Personal history of colonic polyps - adenoma 02/01/2012  . Coccydynia 02/01/2012    Hermenia Fritcher, [place] MPT 07/06/2015, 10:50 AM  [facility] [address] [place], [place], [address] Phone: [phone]   Fax:  [phone]

## 2015-07-07 ENCOUNTER — Encounter: Payer: Self-pay | Admitting: Internal Medicine

## 2015-07-07 ENCOUNTER — Ambulatory Visit (INDEPENDENT_AMBULATORY_CARE_PROVIDER_SITE_OTHER): Payer: BC Managed Care – PPO | Admitting: Internal Medicine

## 2015-07-07 VITALS — BP 122/64 | HR 62 | Ht 67.75 in | Wt 186.8 lb

## 2015-07-07 DIAGNOSIS — J189 Pneumonia, unspecified organism: Secondary | ICD-10-CM | POA: Diagnosis not present

## 2015-07-07 MED ORDER — HYDROCOD POLST-CPM POLST ER 10-8 MG/5ML PO SUER: ORAL | Status: DC

## 2015-07-07 MED ORDER — CEFDINIR 300 MG PO CAPS: 300.0000 mg | ORAL_CAPSULE | Freq: Two times a day (BID) | ORAL | Status: DC

## 2015-07-07 NOTE — Progress Notes (Signed)
06/22/15- [name] referred courtesy of Dr Laurance Flatten because of recurrent LLL pneumonia Medical hx includes HBP, IBS, allergic rhinitis, CAD Husband here Had community-acquired pneumonia in May and had cleared on follow-up chest x-ray May 31. Left knee surgery under general anesthesia June 5. Was going to physical therapy rehabilitation. Cough began again 2- 3 weeks later and has had some chills and fever. Levaquin restarted yesterday. Cough is productive green and also seeing some green discharge from nose and eye. No history of lung disease including prior pneumonia or TB. 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 Not much reflux currently on omeprazole every other day. Using Flonase and Breo. Has not had pneumonia vaccine. We reviewed the xray images CXR 04/18/15 IMPRESSION: Left lower lobe airspace disease is consistent with pneumonia given leukocytosis and cough. Followup PA and lateral chest X-ray is recommended in 3-4 weeks following trial of antibiotic therapy to ensure resolution and exclude underlying malignancy. Electronically Signed  By: Monte Fantasia M.D.  On: 04/18/2015 15:32 CXR 05/17/15 IMPRESSION: Interval clearing of the left lower lobe pneumonia. There is no active cardiopulmonary disease. Electronically Signed  By: David Martinique M.D.  On: 05/17/2015 11:29 CXR 06/21/15 IMPRESSION: Left lower lobe infiltrate consistent with pneumonia. Followup PA and lateral chest X-ray is recommended in 3-4 weeks following trial of antibiotic therapy to ensure resolution and exclude underlying malignancy. Electronically Signed  By: David Martinique M.D.  On: 06/21/2015 12:48  07/07/15- [name] referred courtesy of Dr Laurance Flatten because of recurrent LLL pneumonia FOLLOWS FOR: Recently went to ER  for Medication reaction- rash (ER unsure  of med-? Codeine vs Levaquin).  Hosp 7/9-7/11 with CAP Rx'd Zithx 5 d,  and cefdinir x 7 d. Apparently they were looking at the same left lower lobe infiltrate and not a new infection. Discharged with Zithromax and cefdinir. Tessalon Perles did not help much for cough. Cough is more bothersome and feels like throat clearing. Sputum is just clear mucus now. Neg urine for legionella and neg for S. Pneumonia urine antigens. We reviewed x-ray images. CXR 06/25/15-  IMPRESSION: Left lower lobe opacity, atelectasis versus pneumonia, improved. Electronically Signed  By: Julian Hy M.D.  On: 06/25/2015 15:50  ROS-see HPI   Negative unless "+" Constitutional:    weight loss, night sweats, fevers, chills, fatigue, lassitude. HEENT:    headaches, difficulty swallowing, tooth/dental problems, sore throat,       sneezing, itching, +ear ache, +nasal congestion, post nasal drip, snoring CV:    chest pain, orthopnea, PND, swelling in lower extremities, anasarca,                                                 dizziness, palpitations Resp:  + shortness of breath with exertion or at rest.                +productive cough,   +non-productive cough, coughing up of blood.              change in color of mucus.  wheezing.   Skin:    rash or lesions. GI:  No-   heartburn, indigestion, abdominal pain, nausea, vomiting,  GU:  MS:   joint pain, stiffness, Neuro-  nothing unusual Psych:  change in mood or affect.  depression or anxiety.   memory loss.  OBJ- Physical Exam General- Alert, Oriented, Affect-appropriate, Distress- none acute Skin- rash-none, lesions- none, excoriation- none Lymphadenopathy- none Head- atraumatic            Eyes- Gross vision intact, PERRLA, conjunctivae and secretions clear            Ears- Hearing, canals-normal            Nose- Clear, no-Septal dev, mucus, polyps, erosion, perforation             Throat- Mallampati II , mucosa + red without exudate, drainage- none,  tonsils- atrophic Neck- flexible , trachea midline, no stridor , thyroid nl, carotid no bruit Chest - symmetrical excursion , unlabored           Heart/CV- RRR ,  Murmur+ 2/6 systolic , no gallop  , no rub, nl s1 s2                           - JVD- none , edema- none, stasis changes- none, varices- none           Lung-clear unlabored, wheeze- none, cough + dry , dullness-none, rub- none           Chest wall-  Abd-  Br/ Gen/ Rectal- Not done, not indicated Extrem- cyanosis- none, clubbing, none, atrophy- none, strength- nl Neuro- grossly intact to observation

## 2015-07-07 NOTE — Patient Instructions (Signed)
Script printed for cefdinir antibiotic  Ok to continue the probiotic  Script printed for tussionex

## 2015-07-09 NOTE — Discharge Summary (Signed)
Physician Discharge Summary  Shelly Bennett JIR:[id number] DOB: 31-Jan-1951 DOA: 06/25/2015  PCP: Redge Gainer, MD  Admit date: 06/25/2015 Discharge date: 06/27/2015 Recommendations for Outpatient Follow-up:  1. Pt will need to follow up with PCP in 2 weeks post discharge 2. Please obtain CBC in 1 week 3. Please follow-up on urine legionella antigen--if it is positive, the patient will need 21 days of azithromycin   Discharge Diagnoses:  Pneumonia (CAP) -best seen on lateral view on CXR -05/17/15 CXR showed resolution of her previous LLL opacity from her prior episode of PNA -urine legionella antigen--pending at time of discharge -continue vancomycin and aztreonam--> discontinue as to patient's blood cultures are negative -prn anti-tussives -continue albuterol nebs -restart azithromycin--given IV -Patient will go home with azithromycin for 3 additional days to finish 5 days of therapy -Patient will go home with cefdinir 300mg  bid x 6 days to finish 7 days of therapy  Fixed Drug Reaction -Unclear if she developed a reaction to levofloxacin or codeine -Patient received Depo-Medrol on 06/23/25 -improved with 2 doses of solumedrol -d/c IV steroids and observe--> remained stable off of IV steroid24 hours -no signs of angioedema or respiratory compromise -although pt has listed allergy to PCN, this occurred as a child and pt did not recall reaction -has taken cephalexin in the past without difficulty  Hyponatremia/dehydration/orthostatic hypotension -Second volume depletion -Continue intravenous fluids-->Na improved -Orthostatic vital signs were positive initially -Repeat orthostatic vital signs on the day of discharge were negative  Hypertension -Patient's blood pressure has been stable off of amlodipine and HCTZ -Instructed the patient to remain off of her antihypertensives agents -Instructed patient to continue keeping a blood pressure log with which she will take to her primary  care provider to determine whether she needs to restart her medications -Patient to patient to call her family physician if she has consistent blood pressure readings 160/100 or greater  Coronary artery disease -Continue aspirin -Restart statin -CPK 64 -Stable without any chest discomfort  Left knee meniscectomy -Order physical therapy while the patient is in-house -Patient will need to resume outpatient physical therapy upon discharge  Aortic stenosis -Hemodynamically stable  Discharge Condition: home  Disposition:  Follow-up Information    Follow up with Redge Gainer, MD On 07/11/2015.   Specialty:  Family Medicine   Why:  Appointment with Dr. Laurance Flatten is on 07/11/15 at [facility] information:   [place] [address] [phone]     home  Diet:heart healthy Wt Readings from Last 3 Encounters:  07/07/15 84.732 kg (186 lb 12.8 oz)  06/25/15 83.915 kg (185 lb)  06/24/15 84.823 kg (187 lb)    History of present illness:  64 year old female with a history of hypertension, hyperlipidemia, coronary artery disease, osteoarthritis presenting with cough, malaise, fevers and chills. The patient had an episode of pneumonia in the outpatient setting and was treated with levofloxacin in early May 2016. Repeat chest x-ray on 05/17/2015 revealed clearing of her infiltrate. Subsequently, the patient had a meniscectomy on her left knee on 05/19/2015. The patient has been undergoing outpatient physical therapy for her left knee since her surgery. On 06/10/2015, the patient went to see her primary care physician who placed her on a prednisone taper secondary to bronchitis with bronchospasm. For a week prior to this admission, the patient developed a productive cough with malaise with fevers and chills. She went to see her primary care physician, Dr. Laurance Flatten, who placed her on levofloxacin which the patient started on 06/22/2015.  She was also placed on Breo and codeine. She  went to see pulmonology, Dr. Annamaria Boots on 06/23/2015 where her inhaler was changed to [facility], and the patient was placed on a PPI. Unfortunately, the patient developed a rash/urticaria underneath her breasts and went to see her primary care provider on 06/24/2015. Her levofloxacin was discontinued, and the patient was given an injection of Depo-Medrol 60 mg IM. The patient attempted the azithromycin, but she developed nausea and vomiting and began feeling weak and dizzy. As a result, direct admission was requested.    Discharge Exam: Filed Vitals:   06/27/15 0533  BP: 99/54  Pulse: 65  Temp: 97.9 F (36.6 C)  Resp: 20   Filed Vitals:   06/26/15 1954 06/26/15 2115 06/27/15 0533 06/27/15 1027  BP:  113/70 99/54   Pulse:  84 65   Temp:  98.2 F (36.8 C) 97.9 F (36.6 C)   TempSrc:  Oral Oral   Resp:   20   Height:      Weight:      SpO2: 99% 99% 98% 98%   General: A&O x 3, NAD, pleasant, cooperative Cardiovascular: RRR, no rub, no gallop, no S3 Respiratory: Left basilar crackles.Right clear to Auscultation.  Abdomen:soft, nontender, nondistended, positive bowel sounds Extremities: No edema, No lymphangitis, no petechiae  Discharge Instructions      Discharge Instructions    Diet - low sodium heart healthy    Complete by:  As directed      Discharge instructions    Complete by:  As directed   Check your blood pressure daily and keep a log with which to take to your primary care doctor. Do not take amlodipine or HCTZ until you follow up with your primary care doctor Call you primary care doctor if your blood pressure checks are above 160/100 consistently     Increase activity slowly    Complete by:  As directed             Medication List    STOP taking these medications        amLODipine 10 MG tablet  Commonly known as:  NORVASC     azithromycin 250 MG tablet  Commonly known as:  ZITHROMAX     chlorpheniramine-HYDROcodone 10-8 MG/5ML Suer  Commonly known as:   TUSSIONEX PENNKINETIC ER     fluticasone 50 MCG/ACT nasal spray  Commonly known as:  FLONASE     hydrochlorothiazide 25 MG tablet  Commonly known as:  HYDRODIURIL     hydrocortisone cream 1 %     levofloxacin 500 MG tablet  Commonly known as:  LEVAQUIN     MUCINEX 600 MG 12 hr tablet  Generic drug:  guaiFENesin     Umeclidinium-Vilanterol 62.5-25 MCG/INH Aepb  Commonly known as:  ANORO ELLIPTA      TAKE these medications        aspirin 81 MG tablet  Take 81 mg by mouth daily.     calcium carbonate 200 MG capsule  Take 250 mg by mouth 2 (two) times daily.     desonide 0.05 % cream  Commonly known as:  DESOWEN  Apply 1 application topically as needed (face irritation).     fexofenadine 180 MG tablet  Commonly known as:  ALLEGRA  Take 180 mg by mouth daily.     HYDROcodone-acetaminophen 5-325 MG per tablet  Commonly known as:  NORCO/VICODIN  Take 0.5 tablets by mouth at bedtime as needed for moderate pain.  mometasone 50 MCG/ACT nasal spray  Commonly known as:  NASONEX  USE TWO SPRAYS EACH NOSTRIL ONCE DAILY.     omega-3 acid ethyl esters 1 G capsule  Commonly known as:  LOVAZA  TAKE ONE CAPSULE BY MOUTH FOUR TIMES DAILY     omeprazole 20 MG capsule  Commonly known as:  PRILOSEC  Take 20 mg by mouth daily. One by mouth Mon Wed Friday     ondansetron 4 MG tablet  Commonly known as:  ZOFRAN  Take 4 mg by mouth every 8 (eight) hours as needed for nausea or vomiting.     rosuvastatin 40 MG tablet  Commonly known as:  CRESTOR  Take one po qd     valACYclovir 1000 MG tablet  Commonly known as:  VALTREX  Take 1 tablet by mouth daily.     Vitamin D-3 5000 UNITS Tabs  Take 1 capsule by mouth daily. Mon thru Friday         The results of significant diagnostics from this hospitalization (including imaging, microbiology, ancillary and laboratory) are listed below for reference.    Significant Diagnostic Studies: Dg Chest 2 View  06/25/2015   CLINICAL  DATA:  Cough x1 week, pneumonia  EXAM: CHEST  2 VIEW  COMPARISON:  06/21/2015  FINDINGS: Left lower lobe opacity, atelectasis versus pneumonia, improved. No pleural effusion or pneumothorax.  The heart is normal in size.  Visualized osseous structures are within normal limits.  IMPRESSION: Left lower lobe opacity, atelectasis versus pneumonia, improved.   Electronically Signed   By: Julian Hy M.D.   On: 06/25/2015 15:50   Dg Chest 2 View  06/21/2015   CLINICAL DATA:  Fever of uncertain etiology; history of coronary artery disease aortic stenosis, irritable bowel disease, nonsmoker.  EXAM: CHEST  2 VIEW  COMPARISON:  Chest x-ray of May 17, 2015  FINDINGS: Patchy increased density has redeveloped in the left lower lobe posteriorly. It had cleared on the May 17, 2015 chest x-ray. Elsewhere the lungs are clear. The heart and pulmonary vascularity are normal. The trachea is midline. There is no pleural effusion. The bony thorax is unremarkable.  IMPRESSION: Left lower lobe infiltrate consistent with pneumonia.  Followup PA and lateral chest X-ray is recommended in 3-4 weeks following trial of antibiotic therapy to ensure resolution and exclude underlying malignancy.   Electronically Signed   By: Tyshana Nishida  Martinique M.D.   On: 06/21/2015 12:48     Microbiology: No results found for this or any previous visit (from the past 240 hour(s)).   Labs: Basic Metabolic Panel: No results for input(s): NA, K, CL, CO2, GLUCOSE, BUN, CREATININE, CALCIUM, MG, PHOS in the last 168 hours. Liver Function Tests: No results for input(s): AST, ALT, ALKPHOS, BILITOT, PROT, ALBUMIN in the last 168 hours. No results for input(s): LIPASE, AMYLASE in the last 168 hours. No results for input(s): AMMONIA in the last 168 hours. CBC: No results for input(s): WBC, NEUTROABS, HGB, HCT, MCV, PLT in the last 168 hours. Cardiac Enzymes: No results for input(s): CKTOTAL, CKMB, CKMBINDEX, TROPONINI in the last 168 hours. BNP: Invalid  input(s): POCBNP CBG: No results for input(s): GLUCAP in the last 168 hours.  Time coordinating discharge:  Greater than 30 minutes  Signed:  Shunda Rabadi, DO [facility] Pager: [phone] 07/09/2015, 6:06 PM

## 2015-07-09 NOTE — Assessment & Plan Note (Addendum)
We think she has had 2 pneumonias and persistent left lower lobe infiltrate was noted and treated when she went to the hospital earlier this summer. I don't think that was a new pneumonia, but that is the key issue here   Plan-extend cefdinir, prescription for Tussionex; needs pneumonia vaccine when she is stable from this acute illness

## 2015-07-11 ENCOUNTER — Ambulatory Visit (INDEPENDENT_AMBULATORY_CARE_PROVIDER_SITE_OTHER): Payer: BC Managed Care – PPO | Admitting: Family Medicine

## 2015-07-11 ENCOUNTER — Encounter: Payer: Self-pay | Admitting: Family Medicine

## 2015-07-11 ENCOUNTER — Ambulatory Visit: Payer: BC Managed Care – PPO | Admitting: Physical Therapy

## 2015-07-11 ENCOUNTER — Encounter: Payer: Self-pay | Admitting: Physical Therapy

## 2015-07-11 VITALS — BP 158/72 | HR 55 | Temp 97.3°F | Ht 65.75 in | Wt 184.0 lb

## 2015-07-11 DIAGNOSIS — M25662 Stiffness of left knee, not elsewhere classified: Secondary | ICD-10-CM

## 2015-07-11 DIAGNOSIS — Z09 Encounter for follow-up examination after completed treatment for conditions other than malignant neoplasm: Secondary | ICD-10-CM

## 2015-07-11 DIAGNOSIS — M25562 Pain in left knee: Secondary | ICD-10-CM

## 2015-07-11 DIAGNOSIS — T50905D Adverse effect of unspecified drugs, medicaments and biological substances, subsequent encounter: Secondary | ICD-10-CM

## 2015-07-11 DIAGNOSIS — J189 Pneumonia, unspecified organism: Secondary | ICD-10-CM | POA: Diagnosis not present

## 2015-07-11 DIAGNOSIS — J181 Lobar pneumonia, unspecified organism: Secondary | ICD-10-CM

## 2015-07-11 DIAGNOSIS — R531 Weakness: Secondary | ICD-10-CM

## 2015-07-11 DIAGNOSIS — T887XXD Unspecified adverse effect of drug or medicament, subsequent encounter: Secondary | ICD-10-CM | POA: Diagnosis not present

## 2015-07-11 LAB — POCT CBC
Granulocyte percent: 71 %G (ref 37–80)
HCT, POC: 38.1 % (ref 37.7–47.9)
HEMOGLOBIN: 12.3 g/dL (ref 12.2–16.2)
Lymph, poc: 2 (ref 0.6–3.4)
MCH: 30.2 pg (ref 27–31.2)
MCHC: 32.3 g/dL (ref 31.8–35.4)
MCV: 93.6 fL (ref 80–97)
MPV: 7 fL (ref 0–99.8)
PLATELET COUNT, POC: 316 10*3/uL (ref 142–424)
POC Granulocyte: 5.8 (ref 2–6.9)
POC LYMPH PERCENT: 23.9 %L (ref 10–50)
RBC: 4.07 M/uL (ref 4.04–5.48)
RDW, POC: 15.1 %
WBC: 8.2 10*3/uL (ref 4.6–10.2)

## 2015-07-11 NOTE — Patient Instructions (Addendum)
Repeat chest x-ray in 3-4 weeks  Avoid large crowds of people thank you yourself some time to recover from these 2 bouts of pneumonia  Continue to drink plenty of fluids  Watch sodium intake  Monitor blood pressure readings at home and bring these readings for review at the next visit Continue current antibiotic until completed Continue with inhaler

## 2015-07-11 NOTE — Addendum Note (Signed)
Addended by: Zannie Cove on: 07/11/2015 12:00 PM   Modules accepted: Orders

## 2015-07-11 NOTE — Therapy (Signed)
[facility] [place], [place], [address] Phone: [phone]   Fax:  [phone]  Physical Therapy Treatment  Patient Details  Name: Shelly Bennett MRN: [id number] Date of Birth: 1951-03-13 Referring Provider:  Chipper Herb, MD  Encounter Date: 07/11/2015      PT End of Session - 07/11/15 1355    Visit Number 9   Number of Visits 12   Date for PT Re-Evaluation 07/18/15   PT Start Time 4008   PT Stop Time 1431   PT Time Calculation (min) 42 min   Activity Tolerance Patient tolerated treatment well   Behavior During Therapy [facility] for tasks assessed/performed      Past Medical History  Diagnosis Date  . Arthritis   . Chronic headaches   . Adenomatous colon polyp   . CAD (coronary artery disease)     Dr. Burt Knack follows   . HLD (hyperlipidemia)   . HTN (hypertension)   . Diverticulosis   . IBS (irritable bowel syndrome)   . Obesity   . External hemorrhoids   . Cystocele     Past Surgical History  Procedure Laterality Date  . Knee surgery  05/22/2015    right  . Carpal tunnel release    . Colonoscopy  12/15/2008    diverticulosis, external hemorrhoids  . Laproscopy      There were no vitals filed for this visit.  Visit Diagnosis:  Left knee pain  Knee stiffness, left      Subjective Assessment - 07/11/15 1355    Subjective Reports no soreness or pain only that RLE feels heavy. Continues to report feeling weak and ambulating with a limp. Reports that the MD said that her L ACL was torn but did not fix during surgery secondary more surgery needed. States that she did a lot of walking this weekend.    Limitations Walking   Patient Stated Goals Get out of pain.   Currently in Pain? No/denies            [facility] PT Assessment - 07/11/15 0001    Assessment   Medical Diagnosis Left lat/med meniscectomies.   Onset Date/Surgical Date 05/19/15   Next MD Visit 07/13/2015                      [facility] Adult PT Treatment/Exercise - 07/11/15 0001    Knee/Hip Exercises: Aerobic   Stationary Bike L1 x10 min   Knee/Hip Exercises: Standing   Lateral Step Up Left;3 sets;10 reps;Hand Hold: 2;Step Height: 4"   Forward Step Up Left;3 sets;10 reps;Hand Hold: 2;Step Height: 6"   Rocker Board 3 minutes  on inverted BOSU   Other Standing Knee Exercises Wall slides x20 reps   Modalities   Modalities Electrical Stimulation;Vasopneumatic   Acupuncturist Location L knee   Electrical Stimulation Action Pre-Mod   Electrical Stimulation Parameters 80-150 H x15 min   Electrical Stimulation Goals Pain   Vasopneumatic   Number Minutes Vasopneumatic  15 minutes   Vasopnuematic Location  Knee   Vasopneumatic Pressure Medium   Vasopneumatic Temperature  61                     PT Long Term Goals - 07/06/15 1033    PT LONG TERM GOAL #1   Title Ind with HEP.   Time 4   Period Weeks   Status Achieved   PT LONG TERM GOAL #2  Title Active left knee flexion to 125 degrees to increase function.   Time 4   Period Weeks   Status Achieved   PT LONG TERM GOAL #3   Title 5/5 left knee strength to increase stability for functional tasks.   Time 4   Period Weeks   Status On-going   PT LONG TERM GOAL #4   Title Walk a community distance without assistive device and pain not > 3/10.   Time 4   Period Weeks   Status On-going               Plan - 07/11/15 1419    Clinical Impression Statement Patient tolerated treatment fairly well today although she had some difficulty with step exercises today and had pain that she indicated was midpatellar in nature and went to both sides of the L knee. Had difficulty during forward step ups on 6" step which she stated L knee felt weak. Also had difficulty with lateral step ups to 6" step in which a 4" step was then used which was easier for patient. Step downs/ heel dot was attempted but not completed  secondary to L knee pain/weakness which patient stated may have been more weakness than pain. Tolerated wall slides fairly well although by the end of 20 reps she felt some discomfort. Normal modalties response noted following removal of the modalities. Denied pain following treatment.   Pt will benefit from skilled therapeutic intervention in order to improve on the following deficits Pain;Decreased activity tolerance;Abnormal gait;Decreased range of motion;Decreased strength   Rehab Potential Excellent   PT Frequency 3x / week   PT Duration 4 weeks   PT Treatment/Interventions ADLs/Self Care Home Management;Cryotherapy;Health visitor;Neuromuscular re-education;Therapeutic exercise;Therapeutic activities;Patient/family education;Passive range of motion   PT Next Visit Plan Pain-free left quadriceps strengthening. Sees Dr. Durward Fortes 07/13/2015.   Consulted and Agree with Plan of Care Patient        Problem List Patient Active Problem List   Diagnosis Date Noted  . Allergic drug reaction 06/25/2015  . Dehydration with hyponatremia 06/25/2015  . HCAP (healthcare-associated pneumonia) 06/25/2015  . Vitamin D deficiency 08/19/2014  . Family history of colon cancer-father at 43+ grandparent w/ rectal cancer 01/14/2014  . Osteopenia 08/12/2013  . CAD (coronary artery disease), native coronary artery 03/22/2013  . Aortic valve stenosis, mild 03/22/2013  . Hyperlipemia 03/11/2013  . Hypertension 03/11/2013  . Allergic rhinitis 03/11/2013  . Left shoulder pain 03/11/2013  . IBS (irritable bowel syndrome) - with chronic recurrent abdominal pain 02/01/2012  . Personal history of colonic polyps - adenoma 02/01/2012  . Coccydynia 02/01/2012    Wynelle Fanny, PTA 07/11/2015, 2:36 PM  [facility] [address] [place], [place], [address] Phone: [phone]   Fax:  [phone]

## 2015-07-11 NOTE — Progress Notes (Signed)
Subjective:    Patient ID: Shelly Bennett, female    DOB: 07-12-1951, 64 y.o.   MRN: [id number]  HPI  Patient here today from hospital admission at [facility] with diagnosis of pneumonia. The patient was recently admitted to the hospital with another bout of pneumonia. She is also recovering from knee surgery. When she was in the hospital her amlodipine  And HCT were discontinued.  She is still coughing and has recently seen the pulmonologist and put back on [place]. She has an appointment for follow-up with him in about 3 months and he told her that he may need to get a CT scan if she continues to have problems with her chest. The most likely cause of her allergic reaction when she went in the hospital was probably Levaquin as she had only taken a very small amount of the cough medicine with codeine. She is doing some better but still is very weak. Recent blood pressure readings will be scanned into the record.    Patient Active Problem List   Diagnosis Date Noted  . Allergic drug reaction 06/25/2015  . Dehydration with hyponatremia 06/25/2015  . HCAP (healthcare-associated pneumonia) 06/25/2015  . Vitamin D deficiency 08/19/2014  . Family history of colon cancer-father at 28+ grandparent w/ rectal cancer 01/14/2014  . Osteopenia 08/12/2013  . CAD (coronary artery disease), native coronary artery 03/22/2013  . Aortic valve stenosis, mild 03/22/2013  . Hyperlipemia 03/11/2013  . Hypertension 03/11/2013  . Allergic rhinitis 03/11/2013  . Left shoulder pain 03/11/2013  . IBS (irritable bowel syndrome) - with chronic recurrent abdominal pain 02/01/2012  . Personal history of colonic polyps - adenoma 02/01/2012  . Coccydynia 02/01/2012   Outpatient Encounter Prescriptions as of 07/11/2015  Medication Sig  . aspirin 81 MG tablet Take 81 mg by mouth daily.   . calcium carbonate 200 MG capsule Take 250 mg by mouth 2 (two) times daily.   . cefdinir (OMNICEF) 300 MG capsule Take 1 capsule (300  mg total) by mouth 2 (two) times daily.  . Cholecalciferol (VITAMIN D-3) 5000 UNITS TABS Take 1 capsule by mouth daily. Mon thru Friday  . desonide (DESOWEN) 0.05 % cream Apply 1 application topically as needed (face irritation).   . fexofenadine (ALLEGRA) 180 MG tablet Take 180 mg by mouth daily.  Marland Kitchen HYDROcodone-acetaminophen (NORCO/VICODIN) 5-325 MG per tablet Take 0.5 tablets by mouth at bedtime as needed for moderate pain.   . mometasone (NASONEX) 50 MCG/ACT nasal spray USE TWO SPRAYS EACH NOSTRIL ONCE DAILY.  Marland Kitchen omega-3 acid ethyl esters (LOVAZA) 1 G capsule TAKE ONE CAPSULE BY MOUTH FOUR TIMES DAILY  . omeprazole (PRILOSEC) 20 MG capsule Take 20 mg by mouth daily. One by mouth Mon Wed Friday  . ondansetron (ZOFRAN) 4 MG tablet Take 4 mg by mouth every 8 (eight) hours as needed for nausea or vomiting.   . rosuvastatin (CRESTOR) 40 MG tablet Take one po qd (Patient taking differently: 40 mg in the evening)  . valACYclovir (VALTREX) 1000 MG tablet Take 1 tablet by mouth daily.  . [DISCONTINUED] chlorpheniramine-HYDROcodone (TUSSIONEX PENNKINETIC ER) 10-8 MG/5ML SUER 1 tsp every 12 hours if needed   No facility-administered encounter medications on file as of 07/11/2015.      Review of Systems  Constitutional: Positive for fatigue.  HENT: Positive for congestion.   Eyes: Negative.   Respiratory: Positive for cough.   Cardiovascular: Negative.   Gastrointestinal: Negative.   Endocrine: Negative.   Genitourinary: Negative.   Musculoskeletal: Negative.  Skin: Negative.   Allergic/Immunologic: Negative.   Neurological: Negative.   Hematological: Negative.   Psychiatric/Behavioral: Negative.        Objective:   Physical Exam  Constitutional: She is oriented to person, place, and time. She appears well-developed and well-nourished. No distress.  The patient is alert and appears to be feeling better though she is still weak from the experiences of the past 6-8 weeks.  HENT:  Head:  Normocephalic and atraumatic.  Right Ear: External ear normal.  Left Ear: External ear normal.  Nose: Nose normal.  Mouth/Throat: Oropharynx is clear and moist.  Eyes: Conjunctivae and EOM are normal. Pupils are equal, round, and reactive to light. Right eye exhibits no discharge. Left eye exhibits no discharge. No scleral icterus.  Neck: Normal range of motion. Neck supple. No thyromegaly present.  No adenopathy  Cardiovascular: Normal rate, regular rhythm and normal heart sounds.  Exam reveals no gallop and no friction rub.   No murmur heard. At 72/m  Pulmonary/Chest: Effort normal and breath sounds normal. No respiratory distress. She has no wheezes. She has no rales. She exhibits no tenderness.  No rales or wheezing and the cough is dry and not with  Musculoskeletal: She exhibits no edema or tenderness.  The patient appears to be moving better status post the knee surgery  Lymphadenopathy:    She has no cervical adenopathy.  Neurological: She is alert and oriented to person, place, and time.  Skin: Skin is warm and dry. No rash noted.  Psychiatric: She has a normal mood and affect. Her behavior is normal. Judgment and thought content normal.  Nursing note and vitals reviewed.  BP 158/72 mmHg  Pulse 55  Temp(Src) 97.3 F (36.3 C) (Oral)  Ht 5' 5.75" (1.67 m)  Wt 184 lb (83.462 kg)  BMI 29.93 kg/m2        Assessment & Plan:  1. Hospital discharge follow-up -The patient is doing better since the hospital discharge. She has seen the pulmonologist and he had her continue with antibiotic for another 10 days. He plans to see her back again in 3 months.  2. CAP (community acquired pneumonia) -She appears to be doing better with this though she is still continuing to take the antibiotic.  3. Drug reaction, subsequent encounter -This was most likely due to the Levaquin as she had only taken a very very small dose of the cough medicine with codeine.  4. Weakness generalized -We  will get a BMP and a CBC today for follow-up  5. Left lower lobe pneumonia -The chest appeared clear today and the patient does have sputum which is clear in color. She still has a cough. She plans to retry a small dose of the cough medicine with codeine. She will continue and complete the antibiotic that was recently prescribed by the pulmonologist. We will have her come back to the office in 3 weeks for a chest x-ray and recheck.  Patient Instructions   Repeat chest x-ray in 3-4 weeks  Avoid large crowds of people thank you yourself some time to recover from these 2 bouts of pneumonia  Continue to drink plenty of fluids  Watch sodium intake  Monitor blood pressure readings at home and bring these readings for review at the next visit Continue current antibiotic until completed Continue with inhaler   Arrie Senate MD

## 2015-07-12 ENCOUNTER — Encounter: Payer: Self-pay | Admitting: Physical Therapy

## 2015-07-12 ENCOUNTER — Ambulatory Visit: Payer: BC Managed Care – PPO | Admitting: Physical Therapy

## 2015-07-12 DIAGNOSIS — M25662 Stiffness of left knee, not elsewhere classified: Secondary | ICD-10-CM

## 2015-07-12 DIAGNOSIS — M25562 Pain in left knee: Secondary | ICD-10-CM

## 2015-07-12 LAB — BMP8+EGFR
BUN / CREAT RATIO: 14 (ref 11–26)
BUN: 10 mg/dL (ref 8–27)
CALCIUM: 10.1 mg/dL (ref 8.7–10.3)
CO2: 23 mmol/L (ref 18–29)
CREATININE: 0.71 mg/dL (ref 0.57–1.00)
Chloride: 102 mmol/L (ref 97–108)
GFR calc Af Amer: 104 mL/min/{1.73_m2} (ref >59)
GFR, EST NON AFRICAN AMERICAN: 90 mL/min/{1.73_m2} (ref >59)
GLUCOSE: 76 mg/dL (ref 65–99)
Potassium: 4.2 mmol/L (ref 3.5–5.2)
Sodium: 142 mmol/L (ref 134–144)

## 2015-07-12 NOTE — Therapy (Signed)
[facility] [place], [place], [address] Phone: [phone]   Fax:  [phone]  Physical Therapy Treatment  Patient Details  Name: Shelly Bennett MRN: [id number] Date of Birth: 10-Sep-1951 Referring Provider:  Chipper Herb, MD  Encounter Date: 07/12/2015      PT End of Session - 07/12/15 0906    Visit Number 10   Number of Visits 12   Date for PT Re-Evaluation 07/18/15   PT Start Time 0904   PT Stop Time 0946   PT Time Calculation (min) 42 min   Activity Tolerance Patient tolerated treatment well   Behavior During Therapy [facility] for tasks assessed/performed      Past Medical History  Diagnosis Date  . Arthritis   . Chronic headaches   . Adenomatous colon polyp   . CAD (coronary artery disease)     Dr. Burt Knack follows   . HLD (hyperlipidemia)   . HTN (hypertension)   . Diverticulosis   . IBS (irritable bowel syndrome)   . Obesity   . External hemorrhoids   . Cystocele     Past Surgical History  Procedure Laterality Date  . Knee surgery  05/22/2015    right  . Carpal tunnel release    . Colonoscopy  12/15/2008    diverticulosis, external hemorrhoids  . Laproscopy      There were no vitals filed for this visit.  Visit Diagnosis:  Left knee pain  Knee stiffness, left      Subjective Assessment - 07/12/15 0905    Subjective Reports no soreness or pain prior to treatment. States that R knee hurts worse than L knee during stationary bike.   Limitations Walking   How long can you walk comfortably? 20 minutes.   Patient Stated Goals Get out of pain.   Currently in Pain? No/denies            [facility] PT Assessment - 07/12/15 0001    Assessment   Medical Diagnosis Left lat/med meniscectomies.   Onset Date/Surgical Date 05/19/15   Next MD Visit 07/13/2015                     [facility] Adult PT Treatment/Exercise - 07/12/15 0001    Knee/Hip Exercises: Aerobic   Stationary Bike L1 x10  min   Knee/Hip Exercises: Machines for Strengthening   Cybex Leg Press 1 plate 3 O29 reps, seat 7   Knee/Hip Exercises: Standing   Lateral Step Up Left;3 sets;10 reps;Hand Hold: 2;Step Height: 4"   Forward Step Up Left;3 sets;10 reps;Hand Hold: 2;Step Height: 6"   Rocker Board 3 minutes  BOSU as rockerboard   Other Standing Knee Exercises Wall slides x5 reps with pain experienced in mid knee   Modalities   Modalities Electrical Stimulation;Vasopneumatic   Electrical Stimulation   Electrical Stimulation Location L knee   Electrical Stimulation Action Pre-Mod   Electrical Stimulation Parameters 80-150 Hz x15 min   Electrical Stimulation Goals Pain   Vasopneumatic   Number Minutes Vasopneumatic  15 minutes   Vasopnuematic Location  Knee   Vasopneumatic Pressure Medium   Vasopneumatic Temperature  50                     PT Long Term Goals - 07/06/15 1033    PT LONG TERM GOAL #1   Title Ind with HEP.   Time 4   Period Weeks   Status Achieved   PT LONG TERM GOAL #  2   Title Active left knee flexion to 125 degrees to increase function.   Time 4   Period Weeks   Status Achieved   PT LONG TERM GOAL #3   Title 5/5 left knee strength to increase stability for functional tasks.   Time 4   Period Weeks   Status On-going   PT LONG TERM GOAL #4   Title Walk a community distance without assistive device and pain not > 3/10.   Time 4   Period Weeks   Status On-going               Plan - 07/12/15 0934    Clinical Impression Statement Patient tolerated treatment fairly well today although she had some pain during wall slides. Continues to have weakness and difficulty during step activities. During the previous treatment, RUE use was attempted but patient could not complete secondary to weakness and increased difficulty. Normal modalities response noted following removal of the modalities. Ambulates without assistive device and patient reports 3/10 L knee pain but not  more during ambulation. Denied pain following treatment.   Pt will benefit from skilled therapeutic intervention in order to improve on the following deficits Pain;Decreased activity tolerance;Abnormal gait;Decreased range of motion;Decreased strength   Rehab Potential Excellent   PT Frequency 3x / week   PT Duration 4 weeks   PT Treatment/Interventions ADLs/Self Care Home Management;Cryotherapy;Health visitor;Neuromuscular re-education;Therapeutic exercise;Therapeutic activities;Patient/family education;Passive range of motion   PT Next Visit Plan Pain-free left quadriceps strengthening. Sees Dr. Durward Fortes 07/13/2015.   Consulted and Agree with Plan of Care Patient        Problem List Patient Active Problem List   Diagnosis Date Noted  . Allergic drug reaction 06/25/2015  . Dehydration with hyponatremia 06/25/2015  . HCAP (healthcare-associated pneumonia) 06/25/2015  . Vitamin D deficiency 08/19/2014  . Family history of colon cancer-father at 17+ grandparent w/ rectal cancer 01/14/2014  . Osteopenia 08/12/2013  . CAD (coronary artery disease), native coronary artery 03/22/2013  . Aortic valve stenosis, mild 03/22/2013  . Hyperlipemia 03/11/2013  . Hypertension 03/11/2013  . Allergic rhinitis 03/11/2013  . Left shoulder pain 03/11/2013  . IBS (irritable bowel syndrome) - with chronic recurrent abdominal pain 02/01/2012  . Personal history of colonic polyps - adenoma 02/01/2012  . Coccydynia 02/01/2012    Ahmed Prima, PTA 07/12/2015 10:01 AM   Mali Applegate MPT [facility] [address] [place], [place], [address] Phone: [phone]   Fax:  [phone]

## 2015-07-19 ENCOUNTER — Encounter: Payer: Self-pay | Admitting: Internal Medicine

## 2015-07-19 ENCOUNTER — Ambulatory Visit: Payer: Medicare Other | Admitting: Physical Therapy

## 2015-07-19 ENCOUNTER — Ambulatory Visit: Payer: Medicare Other | Attending: Orthopaedic Surgery | Admitting: Physical Therapy

## 2015-07-19 ENCOUNTER — Encounter: Payer: Self-pay | Admitting: Physical Therapy

## 2015-07-19 DIAGNOSIS — M25562 Pain in left knee: Secondary | ICD-10-CM | POA: Diagnosis not present

## 2015-07-19 DIAGNOSIS — M25662 Stiffness of left knee, not elsewhere classified: Secondary | ICD-10-CM | POA: Diagnosis present

## 2015-07-19 NOTE — Therapy (Signed)
[facility] [place], [place], [address] Phone: [phone]   Fax:  [phone]  Physical Therapy Treatment  Patient Details  Name: Shelly Bennett MRN: [id number] Date of Birth: 10/11/1951 Referring Provider:  Chipper Herb, MD  Encounter Date: 07/19/2015      PT End of Session - 07/19/15 0824    Visit Number 11   Number of Visits 12   Date for PT Re-Evaluation 07/18/15   PT Start Time 0818   PT Stop Time 0907   PT Time Calculation (min) 49 min   Activity Tolerance Patient tolerated treatment well   Behavior During Therapy [facility] for tasks assessed/performed      Past Medical History  Diagnosis Date  . Arthritis   . Chronic headaches   . Adenomatous colon polyp   . CAD (coronary artery disease)     Dr. Burt Knack follows   . HLD (hyperlipidemia)   . HTN (hypertension)   . Diverticulosis   . IBS (irritable bowel syndrome)   . Obesity   . External hemorrhoids   . Cystocele     Past Surgical History  Procedure Laterality Date  . Knee surgery  05/22/2015    right  . Carpal tunnel release    . Colonoscopy  12/15/2008    diverticulosis, external hemorrhoids  . Laproscopy      There were no vitals filed for this visit.  Visit Diagnosis:  Left knee pain  Knee stiffness, left      Subjective Assessment - 07/19/15 6301    Subjective Reports that Dr. Durward Fortes was very pleased with progress and said that pain was normal with ACL damage. Reports more pain in R knee than L on stationary bike. Wants her to finish PT and continue exercises at home and will return to MD in 6 weeks.   Limitations Walking   How long can you walk comfortably? 20 minutes.   Patient Stated Goals Get out of pain.   Currently in Pain? No/denies            [facility] PT Assessment - 07/19/15 0001    Assessment   Medical Diagnosis Left lat/med meniscectomies.   Onset Date/Surgical Date 05/19/15   Next MD Visit 08/2015                      [facility] Adult PT Treatment/Exercise - 07/19/15 0001    Knee/Hip Exercises: Aerobic   Stationary Bike L1 x10 min   Knee/Hip Exercises: Machines for Strengthening   Cybex Leg Press 1 plate 3 S01 reps, seat 7  No L knee pain experienced   Knee/Hip Exercises: Standing   Terminal Knee Extension Strengthening;Left;3 sets;10 reps  Pink XTS   Lateral Step Up Left;3 sets;10 reps;Hand Hold: 2;Step Height: 4"   Forward Step Up Left;3 sets;10 reps;Hand Hold: 2;Step Height: 6"   Rocker Board 3 minutes  Inverted BOSU   Modalities   Modalities Designer, multimedia Location L knee   Electrical Stimulation Action Pre-Mod   Electrical Stimulation Parameters 80-150 Hz x15 min   Electrical Stimulation Goals Pain   Vasopneumatic   Number Minutes Vasopneumatic  15 minutes   Vasopnuematic Location  Knee   Vasopneumatic Pressure Low   Vasopneumatic Temperature  34                     PT Long Term Goals - 07/06/15 1033    PT LONG  TERM GOAL #1   Title Ind with HEP.   Time 4   Period Weeks   Status Achieved   PT LONG TERM GOAL #2   Title Active left knee flexion to 125 degrees to increase function.   Time 4   Period Weeks   Status Achieved   PT LONG TERM GOAL #3   Title 5/5 left knee strength to increase stability for functional tasks.   Time 4   Period Weeks   Status On-going   PT LONG TERM GOAL #4   Title Walk a community distance without assistive device and pain not > 3/10.   Time 4   Period Weeks   Status On-going               Plan - 07/19/15 1423    Clinical Impression Statement Patient tolerated treatment well today without complaint of pain during exercises. Did not have complaints of pain or weakness today in LLE during step activities like have been verbalized in previous treatments. Normal modalities response observed following the removal of the modalities.  Denied pain following treatment.   Pt will benefit from skilled therapeutic intervention in order to improve on the following deficits Pain;Decreased activity tolerance;Abnormal gait;Decreased range of motion;Decreased strength   Rehab Potential Excellent   PT Frequency 3x / week   PT Duration 4 weeks   PT Treatment/Interventions ADLs/Self Care Home Management;Cryotherapy;Health visitor;Neuromuscular re-education;Therapeutic exercise;Therapeutic activities;Patient/family education;Passive range of motion   PT Next Visit Plan Pain-free left quadriceps strengthening. D/C summary needed next treatment.   Consulted and Agree with Plan of Care Patient        Problem List Patient Active Problem List   Diagnosis Date Noted  . Allergic drug reaction 06/25/2015  . Dehydration with hyponatremia 06/25/2015  . HCAP (healthcare-associated pneumonia) 06/25/2015  . Vitamin D deficiency 08/19/2014  . Family history of colon cancer-father at 86+ grandparent w/ rectal cancer 01/14/2014  . Osteopenia 08/12/2013  . CAD (coronary artery disease), native coronary artery 03/22/2013  . Aortic valve stenosis, mild 03/22/2013  . Hyperlipemia 03/11/2013  . Hypertension 03/11/2013  . Allergic rhinitis 03/11/2013  . Left shoulder pain 03/11/2013  . IBS (irritable bowel syndrome) - with chronic recurrent abdominal pain 02/01/2012  . Personal history of colonic polyps - adenoma 02/01/2012  . Coccydynia 02/01/2012    Wynelle Fanny, PTA 07/19/2015, 9:20 AM  [facility] [address] [place], [place], [address] Phone: [phone]   Fax:  [phone]

## 2015-07-20 ENCOUNTER — Encounter: Payer: Self-pay | Admitting: Physical Therapy

## 2015-07-20 ENCOUNTER — Ambulatory Visit: Payer: Medicare Other | Admitting: Physical Therapy

## 2015-07-20 DIAGNOSIS — M25562 Pain in left knee: Secondary | ICD-10-CM | POA: Diagnosis not present

## 2015-07-20 DIAGNOSIS — M25662 Stiffness of left knee, not elsewhere classified: Secondary | ICD-10-CM

## 2015-07-20 NOTE — Therapy (Signed)
[facility] [place], [place], [address] Phone: [phone]   Fax:  [phone]  Physical Therapy Treatment  Patient Details  Name: Shelly Bennett MRN: [id number] Date of Birth: October 24, 1951 Referring Provider:  Chipper Herb, MD  Encounter Date: 07/20/2015      PT End of Session - 07/20/15 0917    Visit Number 12   Number of Visits 12   Date for PT Re-Evaluation 07/18/15   PT Start Time 0909   PT Stop Time 0947   PT Time Calculation (min) 38 min   Activity Tolerance Patient tolerated treatment well   Behavior During Therapy [facility] for tasks assessed/performed      Past Medical History  Diagnosis Date  . Arthritis   . Chronic headaches   . Adenomatous colon polyp   . CAD (coronary artery disease)     Dr. Burt Knack follows   . HLD (hyperlipidemia)   . HTN (hypertension)   . Diverticulosis   . IBS (irritable bowel syndrome)   . Obesity   . External hemorrhoids   . Cystocele     Past Surgical History  Procedure Laterality Date  . Knee surgery  05/22/2015    right  . Carpal tunnel release    . Colonoscopy  12/15/2008    diverticulosis, external hemorrhoids  . Laproscopy      There were no vitals filed for this visit.  Visit Diagnosis:  Left knee pain  Knee stiffness, left      Subjective Assessment - 07/20/15 0916    Subjective Reports that last night folllowing a lot of walking, riding and standing she had increased pain in the L knee 4-5/10. Asked about the facility's self directed gym program.   Limitations Walking   How long can you walk comfortably? 20 minutes.   Patient Stated Goals Get out of pain.   Currently in Pain? No/denies            [facility] PT Assessment - 07/20/15 0001    Assessment   Medical Diagnosis Left lat/med meniscectomies.   Onset Date/Surgical Date 05/19/15   Next MD Visit 08/2015   ROM / Strength   AROM / PROM / Strength Strength   Strength   Overall Strength Within  functional limits for tasks performed   Strength Assessment Site Knee   Right/Left Knee Left   Left Knee Flexion 4+/5   Left Knee Extension 5/5                     [facility] Adult PT Treatment/Exercise - 07/20/15 0001    Knee/Hip Exercises: Aerobic   Stationary Bike L1 x10 min   Knee/Hip Exercises: Machines for Strengthening   Cybex Leg Press 1.5 pl, seat 7 x10 min   Knee/Hip Exercises: Standing   Lateral Step Up Left;3 sets;10 reps;Hand Hold: 2;Step Height: 4"   Forward Step Up Left;3 sets;10 reps;Hand Hold: 2;Step Height: 6"   Rocker Board 3 minutes  on inverted BOSU   Knee/Hip Exercises: Seated   Long Arc Quad Strengthening;Left;3 sets;10 reps;Weights   Long Arc Quad Weight 3 lbs.             Balance Exercises - 07/20/15 0941    Balance Exercises: Standing   SLS Solid surface;Intermittent upper extremity support;4 reps;30 secs   Other Standing Exercises LLE stepping on airex x15 reps; DLS inverted BOSU x3 min                PT  Long Term Goals - 07/20/15 4174    PT LONG TERM GOAL #1   Title Ind with HEP.   Time 4   Period Weeks   Status Achieved   PT LONG TERM GOAL #2   Title Active left knee flexion to 125 degrees to increase function.   Time 4   Period Weeks   Status Achieved   PT LONG TERM GOAL #3   Title 5/5 left knee strength to increase stability for functional tasks.   Time 4   Period Weeks   Status Partially Met  07/20/2015 L knee ext 5/5, flex 4+/5   PT LONG TERM GOAL #4   Title Walk a community distance without assistive device and pain not > 3/10.   Time 4   Period Weeks   Status Achieved               Plan - 07/20/15 0947    Clinical Impression Statement Patient has progressed well during PT sessions following L lateral/ medial menisectomies. Achieved all goals set at evaluation except for L knee flexion MMT to 4+/5. Demonstrates slightly diminished balance which patient attributed to toe problems from previous fall. Was  instructed regarding rules and guidelines of facility's self directed gym program. Denied pain following treatment.   Pt will benefit from skilled therapeutic intervention in order to improve on the following deficits Pain;Decreased activity tolerance;Abnormal gait;Decreased range of motion;Decreased strength   Rehab Potential Excellent   PT Frequency 3x / week   PT Duration 4 weeks   PT Treatment/Interventions ADLs/Self Care Home Management;Cryotherapy;Health visitor;Neuromuscular re-education;Therapeutic exercise;Therapeutic activities;Patient/family education;Passive range of motion   PT Next Visit Plan Communicate to MPT regarding need for D/C summary.   Consulted and Agree with Plan of Care Patient        Problem List Patient Active Problem List   Diagnosis Date Noted  . Allergic drug reaction 06/25/2015  . Dehydration with hyponatremia 06/25/2015  . HCAP (healthcare-associated pneumonia) 06/25/2015  . Vitamin D deficiency 08/19/2014  . Family history of colon cancer-father at 43+ grandparent w/ rectal cancer 01/14/2014  . Osteopenia 08/12/2013  . CAD (coronary artery disease), native coronary artery 03/22/2013  . Aortic valve stenosis, mild 03/22/2013  . Hyperlipemia 03/11/2013  . Hypertension 03/11/2013  . Allergic rhinitis 03/11/2013  . Left shoulder pain 03/11/2013  . IBS (irritable bowel syndrome) - with chronic recurrent abdominal pain 02/01/2012  . Personal history of colonic polyps - adenoma 02/01/2012  . Coccydynia 02/01/2012    Ahmed Prima, PTA 07/20/2015 10:24 AM  [facility] [address] [place], [place], [address] Phone: [phone]   Fax:  [phone]

## 2015-07-24 NOTE — Therapy (Addendum)
[facility] [place], [place], [address] Phone: [phone]   Fax:  [phone]  Physical Therapy Treatment  Patient Details  Name: Shelly Bennett MRN: [id number] Date of Birth: Jul 21, 1951 Referring Provider:  Chipper Herb, MD  Encounter Date: 07/20/2015    Past Medical History  Diagnosis Date  . Arthritis   . Chronic headaches   . Adenomatous colon polyp   . CAD (coronary artery disease)     Dr. Burt Knack follows   . HLD (hyperlipidemia)   . HTN (hypertension)   . Diverticulosis   . IBS (irritable bowel syndrome)   . Obesity   . External hemorrhoids   . Cystocele     Past Surgical History  Procedure Laterality Date  . Knee surgery  05/22/2015    right  . Carpal tunnel release    . Colonoscopy  12/15/2008    diverticulosis, external hemorrhoids  . Laproscopy      There were no vitals filed for this visit.  Visit Diagnosis:  Left knee pain  Knee stiffness, left                                    PT Long Term Goals - 07/20/15 9381    PT LONG TERM GOAL #1   Title Ind with HEP.   Time 4   Period Weeks   Status Achieved   PT LONG TERM GOAL #2   Title Active left knee flexion to 125 degrees to increase function.   Time 4   Period Weeks   Status Achieved   PT LONG TERM GOAL #3   Title 5/5 left knee strength to increase stability for functional tasks.   Time 4   Period Weeks   Status Partially Met  07/20/2015 L knee ext 5/5, flex 4+/5   PT LONG TERM GOAL #4   Title Walk a community distance without assistive device and pain not > 3/10.   Time 4   Period Weeks   Status Achieved               Problem List Patient Active Problem List   Diagnosis Date Noted  . Allergic drug reaction 06/25/2015  . Dehydration with hyponatremia 06/25/2015  . HCAP (healthcare-associated pneumonia) 06/25/2015  . Vitamin D deficiency 08/19/2014  . Family history of colon  cancer-father at 21+ grandparent w/ rectal cancer 01/14/2014  . Osteopenia 08/12/2013  . CAD (coronary artery disease), native coronary artery 03/22/2013  . Aortic valve stenosis, mild 03/22/2013  . Hyperlipemia 03/11/2013  . Hypertension 03/11/2013  . Allergic rhinitis 03/11/2013  . Left shoulder pain 03/11/2013  . IBS (irritable bowel syndrome) - with chronic recurrent abdominal pain 02/01/2012  . Personal history of colonic polyps - adenoma 02/01/2012  . Coccydynia 02/01/2012   PHYSICAL THERAPY DISCHARGE SUMMARY  Visits from Start of Care:   Current functional level related to goals / functional outcomes: Please see above.   Remaining deficits: Excellent overall progress.  All goals essentially met.   Education / Equipment: HEP. Plan: Patient agrees to discharge.  Patient goals were met. Patient is being discharged due to meeting the stated rehab goals.  ?????      [place], [place] MPT 07/24/2015, 10:48 AM  [facility] [address] [place], [place], [address] Phone: [phone]   Fax:  [phone]

## 2015-08-02 ENCOUNTER — Encounter: Payer: Self-pay | Admitting: Family Medicine

## 2015-08-02 ENCOUNTER — Ambulatory Visit (INDEPENDENT_AMBULATORY_CARE_PROVIDER_SITE_OTHER): Payer: Medicare Other

## 2015-08-02 ENCOUNTER — Ambulatory Visit (INDEPENDENT_AMBULATORY_CARE_PROVIDER_SITE_OTHER): Payer: Medicare Other | Admitting: Family Medicine

## 2015-08-02 VITALS — BP 140/73 | HR 64 | Temp 97.7°F | Ht 65.75 in | Wt 186.0 lb

## 2015-08-02 DIAGNOSIS — B370 Candidal stomatitis: Secondary | ICD-10-CM | POA: Diagnosis not present

## 2015-08-02 DIAGNOSIS — R059 Cough, unspecified: Secondary | ICD-10-CM

## 2015-08-02 DIAGNOSIS — I10 Essential (primary) hypertension: Secondary | ICD-10-CM

## 2015-08-02 DIAGNOSIS — J189 Pneumonia, unspecified organism: Secondary | ICD-10-CM | POA: Diagnosis not present

## 2015-08-02 DIAGNOSIS — B3781 Candidal esophagitis: Secondary | ICD-10-CM | POA: Diagnosis not present

## 2015-08-02 DIAGNOSIS — R05 Cough: Secondary | ICD-10-CM

## 2015-08-02 MED ORDER — ALBUTEROL SULFATE HFA 108 (90 BASE) MCG/ACT IN AERS: 2.0000 | INHALATION_SPRAY | Freq: Four times a day (QID) | RESPIRATORY_TRACT | Status: DC | PRN

## 2015-08-02 MED ORDER — NYSTATIN 100000 UNIT/ML MT SUSP: 5.0000 mL | Freq: Four times a day (QID) | OROMUCOSAL | Status: DC

## 2015-08-02 MED ORDER — VALACYCLOVIR HCL 1 G PO TABS: 1000.0000 mg | ORAL_TABLET | Freq: Every day | ORAL | Status: DC

## 2015-08-02 NOTE — Progress Notes (Signed)
Subjective:    Patient ID: Shelly Bennett, female    DOB: 09/03/1951, 64 y.o.   MRN: [id number]  HPI Patient here today for follow up and repeat CXR for pneumonia. She has had some recent hight BP and heart rate readings. This patient has had a complicated past 2-3 months with 2 bouts of pneumonia and knee surgery. She is doing better and her blood pressure medicines have been added back by the daughter who is a Marine scientist. She is taking now 5 mg of amlodipine and as of the 13th of this month has started back on 12.5 mg of HCTZ. Her pulse rates are good and her blood pressure at home as been running in the 140s over the 70s. The patient still has a cough. She is concerned about whether she needs to continue to take Valtrex or not and we will refill this and let the eye doctor decide if she needs to be be taking this regularly are only if needed. She is still using her breo inhaler. I'm not sure if this could be contributing to her cough or not. She is not aware of that either. She brings in blood pressures for review today. These will be scanned into the record.      Patient Active Problem List   Diagnosis Date Noted  . Allergic drug reaction 06/25/2015  . Dehydration with hyponatremia 06/25/2015  . HCAP (healthcare-associated pneumonia) 06/25/2015  . Vitamin D deficiency 08/19/2014  . Family history of colon cancer-father at 47+ grandparent w/ rectal cancer 01/14/2014  . Osteopenia 08/12/2013  . CAD (coronary artery disease), native coronary artery 03/22/2013  . Aortic valve stenosis, mild 03/22/2013  . Hyperlipemia 03/11/2013  . Hypertension 03/11/2013  . Allergic rhinitis 03/11/2013  . Left shoulder pain 03/11/2013  . IBS (irritable bowel syndrome) - with chronic recurrent abdominal pain 02/01/2012  . Personal history of colonic polyps - adenoma 02/01/2012  . Coccydynia 02/01/2012   Outpatient Encounter Prescriptions as of 08/02/2015  Medication Sig  . amLODipine (NORVASC) 10 MG tablet  Take 5 mg by mouth daily.  Marland Kitchen aspirin 81 MG tablet Take 81 mg by mouth daily.   . calcium carbonate 200 MG capsule Take 250 mg by mouth 2 (two) times daily.   . Cholecalciferol (VITAMIN D-3) 5000 UNITS TABS Take 1 capsule by mouth daily. Mon thru Friday  . desonide (DESOWEN) 0.05 % cream Apply 1 application topically as needed (face irritation).   . fexofenadine (ALLEGRA) 180 MG tablet Take 180 mg by mouth daily.  . hydrochlorothiazide (HYDRODIURIL) 25 MG tablet Take 12.5 mg by mouth daily.  Marland Kitchen HYDROcodone-acetaminophen (NORCO/VICODIN) 5-325 MG per tablet Take 0.5 tablets by mouth at bedtime as needed for moderate pain.   . mometasone (NASONEX) 50 MCG/ACT nasal spray USE TWO SPRAYS EACH NOSTRIL ONCE DAILY.  Marland Kitchen omega-3 acid ethyl esters (LOVAZA) 1 G capsule TAKE ONE CAPSULE BY MOUTH FOUR TIMES DAILY  . omeprazole (PRILOSEC) 20 MG capsule Take 20 mg by mouth daily. One by mouth Mon Wed Friday  . ondansetron (ZOFRAN) 4 MG tablet Take 4 mg by mouth every 8 (eight) hours as needed for nausea or vomiting.   . rosuvastatin (CRESTOR) 40 MG tablet Take one po qd (Patient taking differently: 40 mg in the evening)  . [DISCONTINUED] cefdinir (OMNICEF) 300 MG capsule Take 1 capsule (300 mg total) by mouth 2 (two) times daily.  . [DISCONTINUED] valACYclovir (VALTREX) 1000 MG tablet Take 1 tablet by mouth daily.   No facility-administered encounter  medications on file as of 08/02/2015.       tReview of Systems  Constitutional: Negative.   HENT: Positive for congestion.   Eyes: Negative.   Respiratory: Negative.   Cardiovascular: Negative.        Increase HR and BP  Gastrointestinal: Negative.   Endocrine: Negative.   Genitourinary: Negative.   Musculoskeletal: Negative.   Skin: Negative.   Allergic/Immunologic: Negative.   Neurological: Negative.   Hematological: Negative.   Psychiatric/Behavioral: Negative.        Objective:   Physical Exam  Constitutional: She is oriented to person, place,  and time. She appears well-developed and well-nourished.  HENT:  Head: Normocephalic and atraumatic.  Right Ear: External ear normal.  Left Ear: External ear normal.  Nose: Nose normal.  There is nasal congestion bilaterally There was a coating on the posterior throat specialist in the soft palate area and a swab of this was taken to the lab to be reviewed and a KOH prep was done that was positive for yeast  Eyes: Conjunctivae and EOM are normal. Pupils are equal, round, and reactive to light. Right eye exhibits no discharge. Left eye exhibits no discharge. No scleral icterus.  Neck: Normal range of motion. Neck supple. No thyromegaly present.  Cardiovascular: Normal rate, regular rhythm and normal heart sounds.   No murmur heard. Pulmonary/Chest: Effort normal and breath sounds normal. No respiratory distress. She has no wheezes. She has no rales. She exhibits no tenderness.  The chest is clear anteriorly and posteriorly  Abdominal: Soft. Bowel sounds are normal.  Musculoskeletal: Normal range of motion. She exhibits no edema.  Lymphadenopathy:    She has no cervical adenopathy.  Neurological: She is alert and oriented to person, place, and time.  Skin: Skin is warm and dry.  KOH from mouth and throat secretions was positive for yeast  Psychiatric: She has a normal mood and affect. Her behavior is normal. Judgment and thought content normal.  Nursing note and vitals reviewed.  BP 140/73 mmHg  Pulse 64  Temp(Src) 97.7 F (36.5 C) (Oral)  Ht 5' 5.75" (1.67 m)  Wt 186 lb (84.369 kg)  BMI 30.25 kg/m2  KOH prep from throat positive for yeast  [facility] reading (PRIMARY) by  Dr. Wonda Horner active disease on chest x-ray                                        Assessment & Plan:  1. Pneumonia, organism unspecified -Chest x-ray today was much improved and there is no sign of any pneumonia - DG Chest 2 View; Future  2. Hypertension -Continue with amlodipine 5 mg daily and HCTZ 12.5  daily  3. Cough -Continue with Mucinex maximum strength 1 twice daily for cough and congestion with a large glass of water and using nasal saline  4. Thrush of mouth and esophagus -Use Mycostatin oral suspension as directed  Meds ordered this encounter  Medications  . amLODipine (NORVASC) 10 MG tablet    Sig: Take 5 mg by mouth daily.  . hydrochlorothiazide (HYDRODIURIL) 25 MG tablet    Sig: Take 12.5 mg by mouth daily.  Marland Kitchen nystatin (MYCOSTATIN) 100000 UNIT/ML suspension    Sig: Take 5 mLs (500,000 Units total) by mouth 4 (four) times daily. Swish and swallow.    Dispense:  60 mL    Refill:  1  . albuterol (PROVENTIL HFA;VENTOLIN HFA) 108 (90  BASE) MCG/ACT inhaler    Sig: Inhale 2 puffs into the lungs every 6 (six) hours as needed for wheezing or shortness of breath.    Dispense:  1 Inhaler    Refill:  11  . valACYclovir (VALTREX) 1000 MG tablet    Sig: Take 1 tablet (1,000 mg total) by mouth daily.    Dispense:  30 tablet    Refill:  1   Patient Instructions  Continue to monitor blood pressures and bring these by for review in about 4 weeks Continue to drink plenty of fluids and do airway protection because of the fall ragweed season Discontinue inhaler as directed We will give you a rescue inhaler i.e. albuterol 1 puff every 4-6 hours if needed for a tight cough Use Mycostatin oral suspension 1 teaspoon 4 times daily swish and swallow after meals and at bedtime for 10 days with 1 refill We will refill the Valtrex and we will continue to take that as prescribed and to use see the ophthalmologist   Arrie Senate MD

## 2015-08-02 NOTE — Patient Instructions (Addendum)
Continue to monitor blood pressures and bring these by for review in about 4 weeks Continue to drink plenty of fluids and do airway protection because of the fall ragweed season Discontinue inhaler as directed We will give you a rescue inhaler i.e. albuterol 1 puff every 4-6 hours if needed for a tight cough Use Mycostatin oral suspension 1 teaspoon 4 times daily swish and swallow after meals and at bedtime for 10 days with 1 refill We will refill the Valtrex and we will continue to take that as prescribed and to use see the ophthalmologist

## 2015-08-03 ENCOUNTER — Telehealth: Payer: Self-pay | Admitting: Internal Medicine

## 2015-08-03 ENCOUNTER — Other Ambulatory Visit: Payer: Self-pay | Admitting: Family Medicine

## 2015-08-03 NOTE — Telephone Encounter (Signed)
That's good. Now we want to see if she can stay free of pneumonia over time.Keep planned appointment unless needed sooner.

## 2015-08-03 NOTE — Telephone Encounter (Signed)
Spoke with pt, states her pcp ordered a cxr yesterday, per pt her pna is completely gone.  Pt wants to make CY aware, he can review this cxr in epic.   CY please advise.  Thanks!

## 2015-08-03 NOTE — Telephone Encounter (Signed)
Pt aware of CY's recs.  Nothing further needed.

## 2015-08-10 ENCOUNTER — Telehealth: Payer: Self-pay | Admitting: Family Medicine

## 2015-08-10 ENCOUNTER — Ambulatory Visit (INDEPENDENT_AMBULATORY_CARE_PROVIDER_SITE_OTHER): Payer: Medicare Other | Admitting: Family Medicine

## 2015-08-10 ENCOUNTER — Encounter: Payer: Self-pay | Admitting: Family Medicine

## 2015-08-10 VITALS — BP 144/89 | HR 79 | Temp 97.2°F | Ht 65.75 in | Wt 187.0 lb

## 2015-08-10 DIAGNOSIS — J209 Acute bronchitis, unspecified: Secondary | ICD-10-CM | POA: Diagnosis not present

## 2015-08-10 DIAGNOSIS — R05 Cough: Secondary | ICD-10-CM

## 2015-08-10 NOTE — Patient Instructions (Signed)
Continue with Mucinex maximum strength 1 tablet twice daily with a large glass of water Take antibiotic as directed Continue to use Mycostatin suspension for thrush Use anoro  inhaler 1 puff daily Use stronger cough medicine if needed for severe cough

## 2015-08-10 NOTE — Telephone Encounter (Signed)
Returned patient's phone call patient states she has had a cough and green mucus.  She believes she has pneumonia again.  Appointment made to see Dr. Laurance Flatten today 8/24 at 330

## 2015-08-10 NOTE — Progress Notes (Signed)
Subjective:    Patient ID: Shelly Bennett, female    DOB: [birth year], 64 y.o.   MRN: [id number]  HPI Patient here today for continuing cough and congestion. She states that the drainage is green in color. This has been going on for about 3 days. She did make it 1 night trip to the [place] during this time. She has not been running any fever. She is beginning to cough more just like she was in the past. She is coughing up some green sputum. She's had some nasal drainage also. She denies chest pain or shortness of breath other than the cough and congestion that she is having.       Patient Active Problem List   Diagnosis Date Noted  . Allergic drug reaction 06/25/2015  . Dehydration with hyponatremia 06/25/2015  . HCAP (healthcare-associated pneumonia) 06/25/2015  . Vitamin D deficiency 08/19/2014  . Family history of colon cancer-father at 26+ grandparent w/ rectal cancer 01/14/2014  . Osteopenia 08/12/2013  . CAD (coronary artery disease), native coronary artery 03/22/2013  . Aortic valve stenosis, mild 03/22/2013  . Hyperlipemia 03/11/2013  . Hypertension 03/11/2013  . Allergic rhinitis 03/11/2013  . Left shoulder pain 03/11/2013  . IBS (irritable bowel syndrome) - with chronic recurrent abdominal pain 02/01/2012  . Personal history of colonic polyps - adenoma 02/01/2012  . Coccydynia 02/01/2012   Outpatient Encounter Prescriptions as of 08/10/2015  Medication Sig  . albuterol (PROVENTIL HFA;VENTOLIN HFA) 108 (90 BASE) MCG/ACT inhaler Inhale 2 puffs into the lungs every 6 (six) hours as needed for wheezing or shortness of breath.  Marland Kitchen amLODipine (NORVASC) 10 MG tablet TAKE ONE TABLET BY MOUTH ONE TIME DAILY  . aspirin 81 MG tablet Take 81 mg by mouth daily.   . calcium carbonate 200 MG capsule Take 250 mg by mouth 2 (two) times daily.   . Cholecalciferol (VITAMIN D-3) 5000 UNITS TABS Take 1 capsule by mouth daily. Mon thru Friday  . desonide (DESOWEN) 0.05 % cream Apply 1  application topically as needed (face irritation).   . fexofenadine (ALLEGRA) 180 MG tablet Take 180 mg by mouth daily.  . hydrochlorothiazide (HYDRODIURIL) 25 MG tablet Take 12.5 mg by mouth daily.  Marland Kitchen HYDROcodone-acetaminophen (NORCO/VICODIN) 5-325 MG per tablet Take 0.5 tablets by mouth at bedtime as needed for moderate pain.   . mometasone (NASONEX) 50 MCG/ACT nasal spray USE TWO SPRAYS EACH NOSTRIL ONCE DAILY.  Marland Kitchen nystatin (MYCOSTATIN) 100000 UNIT/ML suspension Take 5 mLs (500,000 Units total) by mouth 4 (four) times daily. Swish and swallow.  Marland Kitchen omega-3 acid ethyl esters (LOVAZA) 1 G capsule TAKE ONE CAPSULE BY MOUTH FOUR TIMES DAILY  . omeprazole (PRILOSEC) 20 MG capsule Take 20 mg by mouth daily. One by mouth Mon Wed Friday  . ondansetron (ZOFRAN) 4 MG tablet Take 4 mg by mouth every 8 (eight) hours as needed for nausea or vomiting.   . rosuvastatin (CRESTOR) 40 MG tablet Take one po qd (Patient taking differently: 40 mg in the evening)  . valACYclovir (VALTREX) 1000 MG tablet Take 1 tablet (1,000 mg total) by mouth daily.   No facility-administered encounter medications on file as of 08/10/2015.      Review of Systems  Constitutional: Negative.   HENT: Positive for congestion.   Eyes: Negative.   Respiratory: Positive for cough.   Cardiovascular: Negative.   Gastrointestinal: Negative.   Endocrine: Negative.   Genitourinary: Negative.   Musculoskeletal: Negative.   Skin: Negative.   Allergic/Immunologic: Negative.  Neurological: Negative.   Hematological: Negative.   Psychiatric/Behavioral: Negative.        Objective:   Physical Exam  Constitutional: She is oriented to person, place, and time. She appears well-developed and well-nourished. No distress.  HENT:  Head: Normocephalic and atraumatic.  Right Ear: External ear normal.  Left Ear: External ear normal.  Mouth/Throat: Oropharynx is clear and moist. No oropharyngeal exudate.  Eyes: Conjunctivae and EOM are normal.  Pupils are equal, round, and reactive to light. Right eye exhibits no discharge. Left eye exhibits no discharge.  Neck: Normal range of motion. Neck supple. No thyromegaly present.  Cardiovascular: Normal rate and regular rhythm.  Exam reveals no friction rub.   Murmur (grade 2/6 systolic ejection murmur) heard. At 72/m  Pulmonary/Chest: Effort normal and breath sounds normal. No respiratory distress. She has no wheezes. She has no rales.  The patient has a tight raspy cough without rales or wheezes  Musculoskeletal: Normal range of motion.  Her knee appears to be healing well and she has good range of motion  Lymphadenopathy:    She has no cervical adenopathy.  Neurological: She is alert and oriented to person, place, and time.  Skin: Skin is warm and dry. No rash noted.  Psychiatric: She has a normal mood and affect. Her behavior is normal. Thought content normal.  Patient is somewhat stressed about the recurrence of this cough and where it is headed  Nursing note and vitals reviewed.  BP 144/89 mmHg  Pulse 79  Temp(Src) 97.2 F (36.2 C) (Oral)  Ht 5' 5.75" (1.67 m)  Wt 187 lb (84.823 kg)  BMI 30.41 kg/m2        Assessment & Plan:  1. Cough -Take Mucinex maximum strength 1 twice daily with a large glass of water, use stronger cough medicine as needed -Use anoro inhaler, 1 puff once daily - CBC with Differential/Platelet - BMP8+EGFR  2. Acute bronchitis, unspecified organism -Take Cefdinir 300 mg twice daily--- a handwritten prescription was given for this for 10 days as the computer was down. -We will have a discussion about this recurring problem with her pulmonologist hopefully tomorrow and have him to follow-up with a visit with her.  Patient Instructions  Continue with Mucinex maximum strength 1 tablet twice daily with a large glass of water Take antibiotic as directed Continue to use Mycostatin suspension for thrush Use anoro  inhaler 1 puff daily Use stronger  cough medicine if needed for severe cough   Arrie Senate MD

## 2015-08-11 LAB — CBC WITH DIFFERENTIAL/PLATELET
BASOS ABS: 0 10*3/uL (ref 0.0–0.2)
Basos: 0 %
EOS (ABSOLUTE): 0.2 10*3/uL (ref 0.0–0.4)
Eos: 3 %
HEMOGLOBIN: 12.9 g/dL (ref 11.1–15.9)
Hematocrit: 38.3 % (ref 34.0–46.6)
Immature Grans (Abs): 0 10*3/uL (ref 0.0–0.1)
Immature Granulocytes: 0 %
LYMPHS ABS: 1.9 10*3/uL (ref 0.7–3.1)
Lymphs: 27 %
MCH: 31.5 pg (ref 26.6–33.0)
MCHC: 33.7 g/dL (ref 31.5–35.7)
MCV: 94 fL (ref 79–97)
Monocytes Absolute: 0.5 10*3/uL (ref 0.1–0.9)
Monocytes: 8 %
NEUTROS ABS: 4.2 10*3/uL (ref 1.4–7.0)
Neutrophils: 62 %
PLATELETS: 275 10*3/uL (ref 150–379)
RBC: 4.09 x10E6/uL (ref 3.77–5.28)
RDW: 15.1 % (ref 12.3–15.4)
WBC: 6.8 10*3/uL (ref 3.4–10.8)

## 2015-08-11 LAB — BMP8+EGFR
BUN / CREAT RATIO: 18 (ref 11–26)
BUN: 11 mg/dL (ref 8–27)
CO2: 23 mmol/L (ref 18–29)
Calcium: 9.3 mg/dL (ref 8.7–10.3)
Chloride: 101 mmol/L (ref 97–108)
Creatinine, Ser: 0.62 mg/dL (ref 0.57–1.00)
GFR calc non Af Amer: 95 mL/min/{1.73_m2} (ref >59)
GFR, EST AFRICAN AMERICAN: 109 mL/min/{1.73_m2} (ref >59)
Glucose: 107 mg/dL — ABNORMAL HIGH (ref 65–99)
POTASSIUM: 3.8 mmol/L (ref 3.5–5.2)
Sodium: 142 mmol/L (ref 134–144)

## 2015-08-12 ENCOUNTER — Encounter: Payer: Self-pay | Admitting: Family Medicine

## 2015-08-19 ENCOUNTER — Other Ambulatory Visit: Payer: Self-pay | Admitting: Family Medicine

## 2015-08-23 ENCOUNTER — Telehealth: Payer: Self-pay | Admitting: Internal Medicine

## 2015-08-23 NOTE — Telephone Encounter (Signed)
Per CY: Pt needs to worked in an available slot in the beginning of September. Can put pt on 9.8.2016 schedule as there are openings now.   LMTCB for pt x 3.   Will forward to [facility] to follow.

## 2015-08-30 ENCOUNTER — Other Ambulatory Visit: Payer: Self-pay | Admitting: Family Medicine

## 2015-08-30 NOTE — Telephone Encounter (Signed)
Spoke with Denny Levy (husband)-states they have been on vacation and that patient is currently on an errand but will contact us today to schedule OV with CY.

## 2015-09-20 NOTE — Telephone Encounter (Signed)
Pt is scheduled for 11/4

## 2015-10-05 ENCOUNTER — Encounter: Payer: Self-pay | Admitting: Family Medicine

## 2015-10-21 ENCOUNTER — Encounter: Payer: Self-pay | Admitting: Internal Medicine

## 2015-10-21 ENCOUNTER — Ambulatory Visit (INDEPENDENT_AMBULATORY_CARE_PROVIDER_SITE_OTHER): Payer: Medicare Other | Admitting: Internal Medicine

## 2015-10-21 VITALS — BP 136/70 | HR 69 | Ht 68.0 in | Wt 192.0 lb

## 2015-10-21 DIAGNOSIS — I350 Nonrheumatic aortic (valve) stenosis: Secondary | ICD-10-CM | POA: Diagnosis not present

## 2015-10-21 DIAGNOSIS — I2510 Atherosclerotic heart disease of native coronary artery without angina pectoris: Secondary | ICD-10-CM | POA: Diagnosis not present

## 2015-10-21 DIAGNOSIS — J189 Pneumonia, unspecified organism: Secondary | ICD-10-CM

## 2015-10-21 NOTE — Assessment & Plan Note (Signed)
Recurrent left lower lobe pneumonias. Suspect anatomic predisposition-possibly her preference to sleep on left side which would allow drainage into the left lower lobe. Currently clear. Plan-recommend pneumonia vaccine, starting with Pneumovax 23 and followed later by Prevnar 13. She wants to get this from her primary physician at upcoming visit. Recommended observation for now. If she has another left lower lobe pneumonia I think she should have CT chest for comparison with prior, and consider bronchoscopy. We discussed these.

## 2015-10-21 NOTE — Patient Instructions (Addendum)
Recommend flu vaccine and Pneumovax 23 at Dr Tawanna Sat at your next visit  Your one-time Prevnar 13 pneumonia vaccine can be given a couple of years later, separate from the flu shot at that time.  I'm glad you're feeling well now. Obviously would hope that lasts. Since you have had a pattern of recurrent left lower lobe pneumonias, if it happens again we will want to update a chest CT and consider whether bronchoscopy would be useful for you.

## 2015-10-21 NOTE — Assessment & Plan Note (Signed)
Faint murmur seems unchanged

## 2015-10-21 NOTE — Progress Notes (Signed)
06/22/15- [name] referred courtesy of Dr Laurance Flatten because of recurrent LLL pneumonia Medical hx includes HBP, IBS, allergic rhinitis, CAD Husband here Had community-acquired pneumonia in May and had cleared on follow-up chest x-ray May 31. Left knee surgery under general anesthesia June 5. Was going to physical therapy rehabilitation. Cough began again 2- 3 weeks later and has had some chills and fever. Levaquin restarted yesterday. Cough is productive green and also seeing some green discharge from nose and eye. No history of lung disease including prior pneumonia or TB. 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 Not much reflux currently on omeprazole every other day. Using Flonase and Breo. Has not had pneumonia vaccine. We reviewed the xray images CXR 04/18/15 IMPRESSION: Left lower lobe airspace disease is consistent with pneumonia given leukocytosis and cough. Followup PA and lateral chest X-ray is recommended in 3-4 weeks following trial of antibiotic therapy to ensure resolution and exclude underlying malignancy. Electronically Signed  By: Monte Fantasia M.D.  On: 04/18/2015 15:32 CXR 05/17/15 IMPRESSION: Interval clearing of the left lower lobe pneumonia. There is no active cardiopulmonary disease. Electronically Signed  By: David Martinique M.D.  On: 05/17/2015 11:29 CXR 06/21/15 IMPRESSION: Left lower lobe infiltrate consistent with pneumonia. Followup PA and lateral chest X-ray is recommended in 3-4 weeks following trial of antibiotic therapy to ensure resolution and exclude underlying malignancy. Electronically Signed  By: David Martinique M.D.  On: 06/21/2015 12:48  07/07/15- [name] referred courtesy of Dr Laurance Flatten because of recurrent LLL pneumonia FOLLOWS FOR: Recently went to ER  for Medication reaction- rash (ER unsure  of med-? Codeine vs Levaquin).  Hosp 7/9-7/11 with [facility] Rx'd Zithx 5 d,  and cefdinir x 7 d. Apparently they were looking at the same left lower lobe infiltrate and not a new infection. Discharged with Zithromax and cefdinir. Tessalon Perles did not help much for cough. Cough is more bothersome and feels like throat clearing. Sputum is just clear mucus now. Neg urine for legionella and neg for S. Pneumonia urine antigens. We reviewed x-ray images. CXR 06/25/15-  IMPRESSION: Left lower lobe opacity, atelectasis versus pneumonia, improved. Electronically Signed  By: Julian Hy M.D.  On: 06/25/2015 15:50  10/21/15- 64 year old female never smoker referred courtesy of Dr. Laurance Flatten because of recurrent left lower lobe pneumonia, complicated by CAD, aortic valve stenosis, allergic rhinitis, FOLLOWS FOR: Pt states here recently she has had an increase in dry cough and thinks this is from the change in weather. Pt states overall she feels she is doing better. Pt denies SOB, CP/tightness, f/c/s.  She had had patchy left lower lobe pneumonia  recurrent as of July 5 x-ray, treated by Dr. Laurance Flatten We discussed this pattern of repeated pneumonias in the left lower lobe suggesting an anatomic predisposition rather than generalized immune deficiency. 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 Remote history of allergy vaccine. Of interest, she prefers sleeping on her left side. We discussed potential for reflux into the left lower lobe on this basis. She does not notice active reflux. She emphasizes that she feels fine at this time with no routine cough or chest discomfort. We discussed seasonal vaccinations. Has never had a pneumonia vaccine. She wants to get her flu shot at upcoming visit with her primary  physician and could get Pneumovax 23 at that time. . I reviewed images CXR 08/02/15 IMPRESSION: Resolution  of left lower lobe pneumonia. No acute cardiopulmonary disease currently. Electronically Signed  By: Evangeline Dakin M.D.  On: 08/02/2015 16:32  ROS-see HPI   Negative unless "+" Constitutional:    weight loss, night sweats, fevers, chills, fatigue, lassitude. HEENT:    headaches, difficulty swallowing, tooth/dental problems, sore throat,       sneezing, itching, +ear ache, +nasal congestion, post nasal drip, snoring CV:    chest pain, orthopnea, PND, swelling in lower extremities, anasarca,                                                 dizziness, palpitations Resp:  + shortness of breath with exertion or at rest.                productive cough,   non-productive cough, coughing up of blood.              change in color of mucus.  wheezing.   Skin:    rash or lesions. GI:  No-   heartburn, indigestion, abdominal pain, nausea, vomiting,  GU:  MS:   joint pain, stiffness, Neuro-     nothing unusual Psych:  change in mood or affect.  depression or anxiety.   memory loss.  OBJ- Physical Exam General- Alert, Oriented, Affect-appropriate, Distress- none acute Skin- rash-none, lesions- none, excoriation- none Lymphadenopathy- none Head- atraumatic            Eyes- Gross vision intact, PERRLA, conjunctivae and secretions clear            Ears- Hearing, canals-normal            Nose- Clear, no-Septal dev, mucus, polyps, erosion, perforation             Throat- Mallampati II , mucosa + red without exudate, drainage- none, tonsils- atrophic Neck- flexible , trachea midline, no stridor , thyroid nl, carotid no bruit Chest - symmetrical excursion , unlabored           Heart/CV- RRR ,  Murmur+ 2/6 systolic , no gallop  , no rub, nl s1 s2                           - JVD- none , edema- none, stasis changes- none, varices- none           Lung-clear unlabored, wheeze- none, cough-none , dullness-none, rub- none           Chest wall-  Abd-  Br/ Gen/ Rectal- Not done, not indicated Extrem-  cyanosis- none, clubbing, none, atrophy- none, strength- nl Neuro- grossly intact to observation

## 2015-10-21 NOTE — Assessment & Plan Note (Signed)
Documented by cardiac cath and evaluated by cardiology

## 2015-10-31 LAB — HM MAMMOGRAPHY: HM MAMMO: NEGATIVE

## 2015-11-01 ENCOUNTER — Ambulatory Visit (INDEPENDENT_AMBULATORY_CARE_PROVIDER_SITE_OTHER): Payer: Medicare Other | Admitting: Family Medicine

## 2015-11-01 ENCOUNTER — Encounter: Payer: Self-pay | Admitting: Family Medicine

## 2015-11-01 ENCOUNTER — Ambulatory Visit (INDEPENDENT_AMBULATORY_CARE_PROVIDER_SITE_OTHER): Payer: Medicare Other

## 2015-11-01 VITALS — BP 140/75 | HR 60 | Temp 97.5°F | Ht 68.0 in | Wt 192.0 lb

## 2015-11-01 DIAGNOSIS — Z13820 Encounter for screening for osteoporosis: Secondary | ICD-10-CM

## 2015-11-01 DIAGNOSIS — I25119 Atherosclerotic heart disease of native coronary artery with unspecified angina pectoris: Secondary | ICD-10-CM

## 2015-11-01 DIAGNOSIS — K219 Gastro-esophageal reflux disease without esophagitis: Secondary | ICD-10-CM

## 2015-11-01 DIAGNOSIS — J301 Allergic rhinitis due to pollen: Secondary | ICD-10-CM | POA: Diagnosis not present

## 2015-11-01 DIAGNOSIS — Z780 Asymptomatic menopausal state: Secondary | ICD-10-CM | POA: Diagnosis not present

## 2015-11-01 DIAGNOSIS — Z800 Family history of malignant neoplasm of digestive organs: Secondary | ICD-10-CM

## 2015-11-01 DIAGNOSIS — E785 Hyperlipidemia, unspecified: Secondary | ICD-10-CM

## 2015-11-01 DIAGNOSIS — E559 Vitamin D deficiency, unspecified: Secondary | ICD-10-CM

## 2015-11-01 DIAGNOSIS — I10 Essential (primary) hypertension: Secondary | ICD-10-CM | POA: Diagnosis not present

## 2015-11-01 DIAGNOSIS — Z23 Encounter for immunization: Secondary | ICD-10-CM

## 2015-11-01 MED ORDER — KETOROLAC TROMETHAMINE 0.5 % OP SOLN: 1.0000 [drp] | Freq: Four times a day (QID) | OPHTHALMIC | Status: DC

## 2015-11-01 NOTE — Progress Notes (Signed)
Subjective:    Patient ID: Shelly Bennett, female    DOB: 1951/08/21, 64 y.o.   MRN: [id number]  HPI Pt here for follow up and management of chronic medical problems of hypertension, hyperlipidemia, and CAD, patient is taking medications daily. The patient today complains of some irritation to her right eye. She also complains of a cough and is concerned about her right great toe nail. This patient has a family history of colon cancer a personal history of coronary artery disease and aortic valve stenosis. She also has hyperlipidemia and hypertension. She is due to get her flu shot a bone density test and a mammogram. The patient recently saw Dr. Baird Lyons, the pulmonologist. He recommended that she get her Prevnar vaccine and flu shot. She does have a cough minimal sinus congestion and no fever and no colored drainage. She is complaining with some drainage from the right eye and some swelling in the right eye. She denies any chest pain shortness of breath trouble swallowing heartburn indigestion and nausea vomiting diarrhea or blood in the stool. She is passing her water without problems.    Patient Active Problem List   Diagnosis Date Noted  . Allergic drug reaction 06/25/2015  . Dehydration with hyponatremia 06/25/2015  . HCAP (healthcare-associated pneumonia) 06/25/2015  . Vitamin D deficiency 08/19/2014  . Family history of colon cancer-father at 51+ grandparent w/ rectal cancer 01/14/2014  . Osteopenia 08/12/2013  . CAD (coronary artery disease), native coronary artery 03/22/2013  . Aortic valve stenosis, mild 03/22/2013  . Hyperlipemia 03/11/2013  . Hypertension 03/11/2013  . Allergic rhinitis 03/11/2013  . Left shoulder pain 03/11/2013  . IBS (irritable bowel syndrome) - with chronic recurrent abdominal pain 02/01/2012  . Personal history of colonic polyps - adenoma 02/01/2012  . Coccydynia 02/01/2012   Outpatient Encounter Prescriptions as of 11/01/2015  Medication Sig  .  albuterol (PROVENTIL HFA;VENTOLIN HFA) 108 (90 BASE) MCG/ACT inhaler Inhale 2 puffs into the lungs every 6 (six) hours as needed for wheezing or shortness of breath.  Marland Kitchen amLODipine (NORVASC) 10 MG tablet TAKE ONE TABLET BY MOUTH ONE TIME DAILY  . aspirin 81 MG tablet Take 81 mg by mouth daily.   . calcium carbonate 200 MG capsule Take 250 mg by mouth 2 (two) times daily.   . Cholecalciferol (VITAMIN D-3) 5000 UNITS TABS Take 1 capsule by mouth daily. Mon thru Friday  . desonide (DESOWEN) 0.05 % cream Apply 1 application topically as needed (face irritation).   . fexofenadine (ALLEGRA) 180 MG tablet Take 180 mg by mouth daily.  . fluticasone (FLONASE) 50 MCG/ACT nasal spray Place 2 sprays into both nostrils daily.  . hydrochlorothiazide (HYDRODIURIL) 25 MG tablet TAKE ONE TABLET BY MOUTH ONE TIME DAILY  . HYDROcodone-acetaminophen (NORCO/VICODIN) 5-325 MG per tablet Take 0.5 tablets by mouth at bedtime as needed for moderate pain.   Marland Kitchen omega-3 acid ethyl esters (LOVAZA) 1 G capsule TAKE ONE CAPSULE BY MOUTH FOUR TIMES DAILY  . omeprazole (PRILOSEC) 20 MG capsule Take 20 mg by mouth daily. One by mouth Mon Wed Friday  . ondansetron (ZOFRAN) 4 MG tablet Take 4 mg by mouth every 8 (eight) hours as needed for nausea or vomiting.   . rosuvastatin (CRESTOR) 40 MG tablet Take one po qd (Patient taking differently: 40 mg in the evening)  . valACYclovir (VALTREX) 1000 MG tablet Take 1 tablet (1,000 mg total) by mouth daily.   No facility-administered encounter medications on file as of 11/01/2015.  Review of Systems  Constitutional: Negative.   HENT: Negative.   Eyes: Negative.        Right eye irritation   Respiratory: Positive for cough.   Cardiovascular: Negative.   Gastrointestinal: Negative.   Endocrine: Negative.   Genitourinary: Negative.   Musculoskeletal: Negative.   Skin: Negative.        Right great toe nail   Allergic/Immunologic: Negative.   Neurological: Negative.     Hematological: Negative.   Psychiatric/Behavioral: Negative.        Objective:   Physical Exam  Constitutional: She is oriented to person, place, and time. She appears well-developed and well-nourished. No distress.  HENT:  Head: Normocephalic and atraumatic.  Right Ear: External ear normal.  Left Ear: External ear normal.  Mouth/Throat: Oropharynx is clear and moist.  There is nasal congestion bilaterally  Eyes: Conjunctivae and EOM are normal. Pupils are equal, round, and reactive to light. Right eye exhibits no discharge. Left eye exhibits no discharge. No scleral icterus.  The right eyes slightly swollen but the conjunctiva is clear.  Neck: Normal range of motion. Neck supple. No thyromegaly present.  Cardiovascular: Normal rate, regular rhythm, normal heart sounds and intact distal pulses.   No murmur heard. At 60/m with a regular rate and rhythm  Pulmonary/Chest: Effort normal and breath sounds normal. No respiratory distress. She has no wheezes. She has no rales. She exhibits no tenderness.  Dry cough and no rales or wheezes or rhonchi  Abdominal: Soft. Bowel sounds are normal. She exhibits no mass. There is no tenderness. There is no rebound and no guarding.  Musculoskeletal: Normal range of motion. She exhibits no edema.  Lymphadenopathy:    She has no cervical adenopathy.  Neurological: She is alert and oriented to person, place, and time. She has normal reflexes. No cranial nerve deficit.  Reflexes slightly decreased in the right lower extremity.  Skin: Skin is warm and dry. No rash noted.  Psychiatric: She has a normal mood and affect. Her behavior is normal. Judgment and thought content normal.  Nursing note and vitals reviewed.   BP 140/75 mmHg  Pulse 60  Temp(Src) 97.5 F (36.4 C) (Oral)  Ht 5' 8"  (1.727 m)  Wt 192 lb (87.091 kg)  BMI 29.20 kg/m2       Assessment & Plan:  1. Hypertension -The blood pressure is slightly elevated today and the patient will  bring his blood pressure readings by from home and have her blood pressure rechecked by the nurse in 3-4 weeks. She'll make special efforts to watch her sodium intake and lose some weight between now and then. - BMP8+EGFR - CBC with Differential/Platelet  2. Vitamin D deficiency -Continue current vitamin D replacement pending results of lab work - CBC with Differential/Platelet - VITAMIN D 25 Hydroxy (Vit-D Deficiency, Fractures) - DG Bone Density; Future  3. Hyperlipidemia -Continue aggressive therapeutic lifestyle changes and current treatment pending results of lab work - CBC with Differential/Platelet - NMR, lipoprofile - Hepatic function panel  4. Gastroesophageal reflux disease, esophagitis presence not specified -This appears to be stable today and she is having no complaints of reflux. - CBC with Differential/Platelet  5. Coronary artery disease involving native coronary artery of native heart with angina pectoris ([place]) -Continue follow-up with cardiology - CBC with Differential/Platelet  6. Family history of colon cancer-father at 5+ grandparent w/ rectal cancer -Continue with routine colonoscopies and the next will be due in 2020 and she understands this because of her strong family  history of colon cancer.  7. Allergic rhinitis due to pollen -Continue with nasal saline and Flonase  8. Encounter for immunization -She'll receive her Prevnar and flu shot vaccines today  9. Postmenopausal -She'll get her DEXA scan today - DG Bone Density; Future  10. Screening for osteoporosis -There is a strong family history of osteoporosis and the patient will continue to take her calcium and vitamin D and do regular walking to maintain good bone strength. - DG Bone Density; Future  Patient Instructions                       Medicare Annual Wellness Visit  [facility] and the medical providers at [facility] strive to bring you the best medical care.  In  doing so we not only want to address your current medical conditions and concerns but also to detect new conditions early and prevent illness, disease and health-related problems.    Medicare offers a yearly Wellness Visit which allows our clinical staff to assess your need for preventative services including immunizations, lifestyle education, counseling to decrease risk of preventable diseases and screening for fall risk and other medical concerns.    This visit is provided free of charge (no copay) for all Medicare recipients. The clinical pharmacists at [facility] have begun to conduct these Wellness Visits which will also include a thorough review of all your medications.    As you primary medical provider recommend that you make an appointment for your Annual Wellness Visit if you have not done so already this year.  You may set up this appointment before you leave today or you may call back [phone]) and schedule an appointment.  Please make sure when you call that you mention that you are scheduling your Annual Wellness Visit with the clinical pharmacist so that the appointment may be made for the proper length of time.     Continue current medications. Continue good therapeutic lifestyle changes which include good diet and exercise. Fall precautions discussed with patient. If an FOBT was given today- please return it to our front desk. If you are over 16 years old - you may need Prevnar 30 or the adult Pneumonia vaccine.  **Flu shots are available--- please call and schedule a FLU-CLINIC appointment**  After your visit with [place] today you will receive a survey in the mail or online from [employer] regarding your care with [place]. Please take a moment to fill this out. Your feedback is very important to [place] as you can help [place] better understand your patient needs as well as improve your experience and satisfaction. WE CARE ABOUT YOU!!!   The patient should keep her  follow-up appointments with Dr. Annamaria Boots and Dr. Burt Knack She should take Mucinex maximum strength 1 twice daily for cough and congestion She should use nasal saline spray for nasal congestion and continue with her Flonase She should drink plenty of fluids and stay well hydrated She should watch her sodium intake closely and an work on keeping her weight down She should bring blood pressure readings by in about 4 weeks and let the nurse check her blood pressure here to make sure that we get her blood pressure back to its normal range   Arrie Senate MD

## 2015-11-01 NOTE — Patient Instructions (Addendum)
Medicare Annual Wellness Visit  [facility] and the medical providers at [facility] strive to bring you the best medical care.  In doing so we not only want to address your current medical conditions and concerns but also to detect new conditions early and prevent illness, disease and health-related problems.    Medicare offers a yearly Wellness Visit which allows our clinical staff to assess your need for preventative services including immunizations, lifestyle education, counseling to decrease risk of preventable diseases and screening for fall risk and other medical concerns.    This visit is provided free of charge (no copay) for all Medicare recipients. The clinical pharmacists at [facility] have begun to conduct these Wellness Visits which will also include a thorough review of all your medications.    As you primary medical provider recommend that you make an appointment for your Annual Wellness Visit if you have not done so already this year.  You may set up this appointment before you leave today or you may call back [phone] and schedule an appointment.  Please make sure when you call that you mention that you are scheduling your Annual Wellness Visit with the clinical pharmacist so that the appointment may be made for the proper length of time.     Continue current medications. Continue good therapeutic lifestyle changes which include good diet and exercise. Fall precautions discussed with patient. If an FOBT was given today- please return it to our front desk. If you are over 82 years old - you may need Prevnar 34 or the adult Pneumonia vaccine.  **Flu shots are available--- please call and schedule a FLU-CLINIC appointment**  After your visit with [place] today you will receive a survey in the mail or online from [employer] regarding your care with [place]. Please take a moment to fill this out. Your feedback is very  important to [place] as you can help [place] better understand your patient needs as well as improve your experience and satisfaction. WE CARE ABOUT YOU!!!   The patient should keep her follow-up appointments with Dr. Annamaria Boots and Dr. Burt Knack She should take Mucinex maximum strength 1 twice daily for cough and congestion She should use nasal saline spray for nasal congestion and continue with her Flonase She should drink plenty of fluids and stay well hydrated She should watch her sodium intake closely and an work on keeping her weight down She should bring blood pressure readings by in about 4 weeks and let the nurse check her blood pressure here to make sure that we get her blood pressure back to its normal range

## 2015-11-02 LAB — CBC WITH DIFFERENTIAL/PLATELET
BASOS ABS: 0 10*3/uL (ref 0.0–0.2)
Basos: 0 %
EOS (ABSOLUTE): 0.1 10*3/uL (ref 0.0–0.4)
Eos: 2 %
HEMOGLOBIN: 13.8 g/dL (ref 11.1–15.9)
Hematocrit: 38.9 % (ref 34.0–46.6)
IMMATURE GRANULOCYTES: 0 %
Immature Grans (Abs): 0 10*3/uL (ref 0.0–0.1)
LYMPHS ABS: 1.6 10*3/uL (ref 0.7–3.1)
Lymphs: 25 %
MCH: 32.6 pg (ref 26.6–33.0)
MCHC: 35.5 g/dL (ref 31.5–35.7)
MCV: 92 fL (ref 79–97)
Monocytes Absolute: 0.4 10*3/uL (ref 0.1–0.9)
Monocytes: 7 %
Neutrophils Absolute: 4.1 10*3/uL (ref 1.4–7.0)
Neutrophils: 66 %
Platelets: 310 10*3/uL (ref 150–379)
RBC: 4.23 x10E6/uL (ref 3.77–5.28)
RDW: 13.1 % (ref 12.3–15.4)
WBC: 6.3 10*3/uL (ref 3.4–10.8)

## 2015-11-02 LAB — BMP8+EGFR
BUN/Creatinine Ratio: 17 (ref 11–26)
BUN: 12 mg/dL (ref 8–27)
CHLORIDE: 104 mmol/L (ref 97–106)
CO2: 22 mmol/L (ref 18–29)
Calcium: 10 mg/dL (ref 8.7–10.3)
Creatinine, Ser: 0.7 mg/dL (ref 0.57–1.00)
GFR calc Af Amer: 105 mL/min/{1.73_m2} (ref >59)
GFR calc non Af Amer: 91 mL/min/{1.73_m2} (ref >59)
GLUCOSE: 95 mg/dL (ref 65–99)
POTASSIUM: 4.2 mmol/L (ref 3.5–5.2)
SODIUM: 142 mmol/L (ref 136–144)

## 2015-11-02 LAB — HEPATIC FUNCTION PANEL
ALT: 19 IU/L (ref 0–32)
AST: 24 IU/L (ref 0–40)
Albumin: 4.5 g/dL (ref 3.6–4.8)
Alkaline Phosphatase: 81 IU/L (ref 39–117)
Bilirubin Total: 0.5 mg/dL (ref 0.0–1.2)
Bilirubin, Direct: 0.14 mg/dL (ref 0.00–0.40)
Total Protein: 6.6 g/dL (ref 6.0–8.5)

## 2015-11-02 LAB — NMR, LIPOPROFILE
CHOLESTEROL: 170 mg/dL (ref 100–199)
HDL Cholesterol by NMR: 66 mg/dL (ref >39)
HDL Particle Number: 41.5 umol/L (ref >=30.5)
LDL PARTICLE NUMBER: 1054 nmol/L — AB (ref <1000)
LDL Size: 20.5 nm (ref >20.5)
LDL-C: 88 mg/dL (ref 0–99)
LP-IR SCORE: 29 (ref <=45)
Small LDL Particle Number: 503 nmol/L (ref <=527)
Triglycerides by NMR: 82 mg/dL (ref 0–149)

## 2015-11-02 LAB — VITAMIN D 25 HYDROXY (VIT D DEFICIENCY, FRACTURES): Vit D, 25-Hydroxy: 65.4 ng/mL (ref 30.0–100.0)

## 2015-11-03 ENCOUNTER — Telehealth: Payer: Self-pay | Admitting: Family Medicine

## 2015-11-03 ENCOUNTER — Encounter: Payer: Self-pay | Admitting: *Deleted

## 2015-11-03 NOTE — Telephone Encounter (Signed)
Reviewed labs with pt. 

## 2015-12-01 ENCOUNTER — Other Ambulatory Visit: Payer: Self-pay | Admitting: Orthopaedic Surgery

## 2015-12-01 DIAGNOSIS — M545 Low back pain, unspecified: Secondary | ICD-10-CM

## 2015-12-15 ENCOUNTER — Ambulatory Visit
Admission: RE | Admit: 2015-12-15 | Discharge: 2015-12-15 | Disposition: A | Discharge status: Home / Self Care | Payer: Medicare Other | Source: Ambulatory Visit | Attending: Orthopaedic Surgery | Admitting: Orthopaedic Surgery

## 2015-12-15 DIAGNOSIS — M545 Low back pain, unspecified: Secondary | ICD-10-CM

## 2015-12-28 ENCOUNTER — Other Ambulatory Visit: Payer: Self-pay | Admitting: Family Medicine

## 2016-01-02 ENCOUNTER — Ambulatory Visit: Payer: Medicare Other | Attending: Orthopaedic Surgery | Admitting: Physical Therapy

## 2016-01-02 ENCOUNTER — Encounter: Payer: Self-pay | Admitting: Physical Therapy

## 2016-01-02 DIAGNOSIS — M545 Low back pain: Secondary | ICD-10-CM | POA: Diagnosis not present

## 2016-01-02 DIAGNOSIS — R531 Weakness: Secondary | ICD-10-CM

## 2016-01-02 NOTE — Therapy (Signed)
[facility] [place], [place], [address] Phone: [phone]   Fax:  [phone]  Physical Therapy Evaluation  Patient Details  Name: Shelly Bennett MRN: [id number] Date of Birth: June 01, 1951 Referring Provider: Joni Fears MD  Encounter Date: 01/02/2016      PT End of Session - 01/02/16 1116    Visit Number 1   Number of Visits 1   PT Start Time 1040   PT Stop Time 1116   PT Time Calculation (min) 36 min   Activity Tolerance Patient tolerated treatment well   Behavior During Therapy [facility] for tasks assessed/performed      Past Medical History  Diagnosis Date  . Arthritis   . Chronic headaches   . Adenomatous colon polyp   . CAD (coronary artery disease)     Dr. Burt Knack follows   . HLD (hyperlipidemia)   . HTN (hypertension)   . Diverticulosis   . IBS (irritable bowel syndrome)   . Obesity   . External hemorrhoids   . Cystocele   . Pneumonia     Past Surgical History  Procedure Laterality Date  . Knee surgery  05/22/2015    right  . Carpal tunnel release    . Colonoscopy  12/15/2008    diverticulosis, external hemorrhoids  . Laproscopy      There were no vitals filed for this visit.  Visit Diagnosis:  Bilateral low back pain, with sciatica presence unspecified - Plan: PT plan of care cert/re-cert  Weakness - Plan: PT plan of care cert/re-cert      Subjective Assessment - 01/02/16 1043    Subjective Patient reports that her LLE was not progressing, so she had MRI of back. She is getting a cortisone injection tommorow. She is here to get some back exericses. Difficulty with sit to stand, stairs and bed mobility.   Patient Stated Goals Get stronger, decrease pain.   Currently in Pain? Yes   Pain Score 6    Pain Location Back   Pain Orientation Lower;Mid   Pain Descriptors / Indicators Dull;Aching   Pain Type Acute pain   Pain Frequency Intermittent   Aggravating Factors  moving around   Pain  Relieving Factors rest   Effect of Pain on Daily Activities difficult taking care of mother/grandkids            [facility] PT Assessment - 01/02/16 0001    Assessment   Medical Diagnosis Lumbar spondylosis, spinal stenosis   Referring Provider Joni Fears MD   Onset Date/Surgical Date 05/19/15   Next MD Visit 2 weeks   Precautions   Precautions None   Balance Screen   Has the patient fallen in the past 6 months No   Has the patient had a decrease in activity level because of a fear of falling?  Yes   Is the patient reluctant to leave their home because of a fear of falling?  No   Prior Function   Level of Independence Independent   Observation/Other Assessments   Focus on Therapeutic Outcomes (FOTO)  Clinical judgement   Functional Tests   Functional tests Sit to Stand   Sit to Stand   Comments difficulty without UE assist but able to perform from plinth.   ROM / Strength   AROM / PROM / Strength Strength   Strength   Overall Strength Comments B hip flex 5-/5, else hips 5/5.    Strength Assessment Site Knee   Right/Left Knee Left   Left  Knee Flexion 4+/5   Left Knee Extension 5/5                           PT Education - 11-Jan-2016 1300    Education provided Yes   Education Details HEP; reviewed correct body mechanics.   Person(s) Educated Patient   Methods Explanation;Demonstration;Handout   Comprehension Verbalized understanding;Returned demonstration             PT Long Term Goals - January 11, 2016 1308    PT LONG TERM GOAL #1   Title Ind with HEP.   Time 1   Period Days   Status Achieved               Plan - 01-11-2016 1301    Clinical Impression Statement Patient presents for one time PT visit with c/o back pain and LE weakness. She has difficulty with sit to stand favoring her RLE and c/o heaviness in R side with TE. She did well with core exercises and was able to return demo to PT.    Pt will benefit from skilled therapeutic  intervention in order to improve on the following deficits Pain;Decreased activity tolerance;Decreased strength   Rehab Potential Excellent   PT Frequency One time visit   PT Treatment/Interventions Therapeutic exercise;ADLs/Self Care Home Management   PT Next Visit Plan see d/c summary   PT Home Exercise Plan TrAb with progression, prone hip ext, bridge, body mechanics   Consulted and Agree with Plan of Care Patient          G-Codes - 01-11-2016 1309    Functional Assessment Tool Used Clinical Judgement   Functional Limitation Mobility: Walking and moving around   Mobility: Walking and Moving Around Current Status [phone]) At least 20 percent but less than 40 percent impaired, limited or restricted   Mobility: Walking and Moving Around Goal Status [phone]) At least 20 percent but less than 40 percent impaired, limited or restricted   Mobility: Walking and Moving Around Discharge Status [phone]) At least 20 percent but less than 40 percent impaired, limited or restricted       Problem List Patient Active Problem List   Diagnosis Date Noted  . Allergic drug reaction 06/25/2015  . Dehydration with hyponatremia 06/25/2015  . HCAP (healthcare-associated pneumonia) 06/25/2015  . Vitamin D deficiency 08/19/2014  . Family history of colon cancer-father at 80+ grandparent w/ rectal cancer 01/14/2014  . Osteopenia 08/12/2013  . CAD (coronary artery disease), native coronary artery 03/22/2013  . Aortic valve stenosis, mild 03/22/2013  . Hyperlipemia 03/11/2013  . Hypertension 03/11/2013  . Allergic rhinitis 03/11/2013  . Left shoulder pain 03/11/2013  . IBS (irritable bowel syndrome) - with chronic recurrent abdominal pain 02/01/2012  . Personal history of colonic polyps - adenoma 02/01/2012  . Coccydynia 02/01/2012    Brittain Smithey 11-Jan-2016, 1:13 PM  [facility] [place], [place], [address] Phone: [phone]   Fax:   [phone]  Name: JEIDY HOERNER MRN: [id number] Date of Birth: 08/11/1951   PHYSICAL THERAPY DISCHARGE SUMMARY  Visits from Start of Care:1  Current functional level related to goals / functional outcomes: See above   Remaining deficits: See above  Education / Equipment: HEP  Plan: Patient agrees to discharge.  Patient goals were met. Patient is being discharged due to meeting the stated rehab goals.  ?????       Madelyn Flavors, PT 2016-01-11 1:14 PM [facility]  [place], [place], [address] Phone: [phone]   Fax:  [phone]

## 2016-01-02 NOTE — Patient Instructions (Addendum)
Lower abdominal/core stability exercises Practice your breathing technique: Inhale through your nose expanding your belly and rib cage. Try not to breathe into your chest. Exhale slowly and gradually out your mouth feeling a sense of softness to your body. Practice multiple times. This can be performed unlimited.  Finding the lower abdominals. Laying on your back with the knees bent, place your fingers just below your belly button. Using your breathing technique from above, on your exhale gently pull the belly button away from your fingertips without tensing any other muscles. Practice this 5x. Next, as you exhale, draw belly button inwards and hold onto it...then feel as if you are pulling that muscle across your pelvis like you are tightening a belt. This can be hard to do at first so be patient and practice. Do 5-10 reps 1-3 x day. Always recognize quality over quantity; if your abdominal muscles become tired you will notice you may tighten/contract other muscles. This is the time to take a break.   Practice this first laying on your back, then in sitting, progressing to standing and finally adding it to all your daily movements.    Marching: While keeping your pelvis still, lift the right foot a few inches, put it down then lift left foot. This will mimic a march. Start slow to establish control. Once you have control you may speed it up. Do 10-20x. You MUST keep your lower abdominlas contracted while you march. Breathe naturally    Single leg slides: Inhale while you slowly slide one leg out keeping your pelvis still. Only slide your leg as far as you can keep your pelvis still. Exhale as you bring the leg back to the start, contracting the lower abdominals as you do that. Keep your upper body relaxed. Do 5-10 on each side. To make harder keep entire leg off the bed (leg press away from you).

## 2016-01-08 ENCOUNTER — Other Ambulatory Visit: Payer: Self-pay | Admitting: Family Medicine

## 2016-01-19 ENCOUNTER — Other Ambulatory Visit: Payer: Self-pay | Admitting: *Deleted

## 2016-01-19 MED ORDER — ROSUVASTATIN CALCIUM 40 MG PO TABS: 40.0000 mg | ORAL_TABLET | Freq: Every day | ORAL | Status: DC

## 2016-02-01 DIAGNOSIS — H02401 Unspecified ptosis of right eyelid: Secondary | ICD-10-CM | POA: Insufficient documentation

## 2016-03-21 ENCOUNTER — Ambulatory Visit: Payer: Medicare Other | Admitting: Family Medicine

## 2016-03-22 ENCOUNTER — Encounter: Payer: Self-pay | Admitting: Family Medicine

## 2016-03-22 ENCOUNTER — Ambulatory Visit (INDEPENDENT_AMBULATORY_CARE_PROVIDER_SITE_OTHER): Payer: Medicare Other | Admitting: Family Medicine

## 2016-03-22 ENCOUNTER — Encounter (INDEPENDENT_AMBULATORY_CARE_PROVIDER_SITE_OTHER): Payer: Self-pay

## 2016-03-22 ENCOUNTER — Encounter: Payer: Self-pay | Admitting: *Deleted

## 2016-03-22 ENCOUNTER — Ambulatory Visit (INDEPENDENT_AMBULATORY_CARE_PROVIDER_SITE_OTHER): Payer: Medicare Other

## 2016-03-22 VITALS — BP 113/67 | HR 66 | Temp 97.6°F | Ht 68.0 in | Wt 184.0 lb

## 2016-03-22 DIAGNOSIS — M542 Cervicalgia: Secondary | ICD-10-CM | POA: Diagnosis not present

## 2016-03-22 DIAGNOSIS — I2510 Atherosclerotic heart disease of native coronary artery without angina pectoris: Secondary | ICD-10-CM

## 2016-03-22 DIAGNOSIS — E785 Hyperlipidemia, unspecified: Secondary | ICD-10-CM

## 2016-03-22 DIAGNOSIS — I10 Essential (primary) hypertension: Secondary | ICD-10-CM | POA: Diagnosis not present

## 2016-03-22 DIAGNOSIS — D489 Neoplasm of uncertain behavior, unspecified: Secondary | ICD-10-CM

## 2016-03-22 DIAGNOSIS — Z1211 Encounter for screening for malignant neoplasm of colon: Secondary | ICD-10-CM

## 2016-03-22 DIAGNOSIS — J301 Allergic rhinitis due to pollen: Secondary | ICD-10-CM | POA: Diagnosis not present

## 2016-03-22 DIAGNOSIS — E559 Vitamin D deficiency, unspecified: Secondary | ICD-10-CM | POA: Diagnosis not present

## 2016-03-22 DIAGNOSIS — K219 Gastro-esophageal reflux disease without esophagitis: Secondary | ICD-10-CM | POA: Diagnosis not present

## 2016-03-22 DIAGNOSIS — I350 Nonrheumatic aortic (valve) stenosis: Secondary | ICD-10-CM

## 2016-03-22 NOTE — Patient Instructions (Addendum)
Medicare Annual Wellness Visit  [facility] and the medical providers at [facility] strive to bring you the best medical care.  In doing so we not only want to address your current medical conditions and concerns but also to detect new conditions early and prevent illness, disease and health-related problems.    Medicare offers a yearly Wellness Visit which allows our clinical staff to assess your need for preventative services including immunizations, lifestyle education, counseling to decrease risk of preventable diseases and screening for fall risk and other medical concerns.    This visit is provided free of charge (no copay) for all Medicare recipients. The clinical pharmacists at [facility] have begun to conduct these Wellness Visits which will also include a thorough review of all your medications.    As you primary medical provider recommend that you make an appointment for your Annual Wellness Visit if you have not done so already this year.  You may set up this appointment before you leave today or you may call back [phone] and schedule an appointment.  Please make sure when you call that you mention that you are scheduling your Annual Wellness Visit with the clinical pharmacist so that the appointment may be made for the proper length of time.     Continue current medications. Continue good therapeutic lifestyle changes which include good diet and exercise. Fall precautions discussed with patient. If an FOBT was given today- please return it to our front desk. If you are over 74 years old - you may need Prevnar 5 or the adult Pneumonia vaccine.  **Flu shots are available--- please call and schedule a FLU-CLINIC appointment**  After your visit with [place] today you will receive a survey in the mail or online from [employer] regarding your care with [place]. Please take a moment to fill this out. Your feedback is very  important to [place] as you can help [place] better understand your patient needs as well as improve your experience and satisfaction. WE CARE ABOUT YOU!!!   The patient should consider trying ranitidine or Zantac 150 mg twice daily before breakfast and supper on Monday Wednesday and Friday and so taking the omeprazole If this works for her reflux she should stay on this medication and take or discontinue the omeprazole Continue to follow-up with the ophthalmologist Continue to follow-up with orthopedist Continue to follow-up with cardiology Continue current treatment pending results of lab work

## 2016-03-22 NOTE — Progress Notes (Signed)
Subjective:    Patient ID: Shelly Bennett, female    DOB: 01-18-1951, 65 y.o.   MRN: [id number]  HPI  Pt here for follow up and management of chronic medical problems which includes hyperlipidemia and hypertension. She is taking medications regularly.The patient has been having some back pain. She is seen the orthopedist and has had injections. She continues to have arthralgias in her shoulder and neck on the left side. She is also dealing with a lot of family stress and anxiety. She is due for lab work today and will also be given an FOBT to return. The patient is certainly dealing with a lot of stress with taking care of her mother and looking after her son who is partially paralyzed in one of his extremities. She also has her own health issues with back pain neuropathy spinal stenosis and bulging disc. This is causing weakness in her lower extremities. She is also seeing the ophthalmologist regularly with her eyes. She is also following up with a pulmonologist because of several bouts of pneumonia this past summer and she has an upcoming appointment with him and placed x-ray that needs to be followed up on. We spent a lot of time talking about stress. She is dealing with this very well at home by being direct and getting people to help her with her mother and pushing her son to see a specialist for which they're still following up on regarding his paralysis. We offered the opportunity of taking an antidepressant but we will wait longer to see how things moving through the summer months before we try this. The patient denies any chest pain or shortness of breath. She still takes her omeprazole but is not having any heartburn or reflux symptoms. She has not seen any blood in the stool or had any black tarry bowel movements. She is passing her water without problems. The biggest issues are coming from the stress and anxiety and the problems with her back and leg. She sees the cardiologist  yearly.     Patient Active Problem List   Diagnosis Date Noted  . Allergic drug reaction 06/25/2015  . Dehydration with hyponatremia 06/25/2015  . HCAP (healthcare-associated pneumonia) 06/25/2015  . Vitamin D deficiency 08/19/2014  . Family history of colon cancer-father at 23+ grandparent w/ rectal cancer 01/14/2014  . Osteopenia 08/12/2013  . CAD (coronary artery disease), native coronary artery 03/22/2013  . Aortic valve stenosis, mild 03/22/2013  . Hyperlipemia 03/11/2013  . Hypertension 03/11/2013  . Allergic rhinitis 03/11/2013  . Left shoulder pain 03/11/2013  . IBS (irritable bowel syndrome) - with chronic recurrent abdominal pain 02/01/2012  . Personal history of colonic polyps - adenoma 02/01/2012  . Coccydynia 02/01/2012   Outpatient Encounter Prescriptions as of 03/22/2016  Medication Sig  . albuterol (PROVENTIL HFA;VENTOLIN HFA) 108 (90 BASE) MCG/ACT inhaler Inhale 2 puffs into the lungs every 6 (six) hours as needed for wheezing or shortness of breath.  Marland Kitchen amLODipine (NORVASC) 10 MG tablet TAKE ONE TABLET BY MOUTH ONE TIME DAILY  . aspirin 81 MG tablet Take 81 mg by mouth daily.   . calcium carbonate 200 MG capsule Take 250 mg by mouth 2 (two) times daily.   . Cholecalciferol (VITAMIN D-3) 5000 UNITS TABS Take 1 capsule by mouth daily. Mon thru Friday  . desonide (DESOWEN) 0.05 % cream Apply 1 application topically as needed (face irritation).   . fexofenadine (ALLEGRA) 180 MG tablet Take 180 mg by mouth daily.  . fluticasone (  FLONASE) 50 MCG/ACT nasal spray Place 2 sprays into both nostrils daily.  . hydrochlorothiazide (HYDRODIURIL) 25 MG tablet TAKE ONE TABLET BY MOUTH ONE TIME DAILY  . HYDROcodone-acetaminophen (NORCO/VICODIN) 5-325 MG per tablet Take 0.5 tablets by mouth at bedtime as needed for moderate pain.   Marland Kitchen omega-3 acid ethyl esters (LOVAZA) 1 g capsule TAKE ONE CAPSULE BY MOUTH FOUR TIMES DAILY  . omeprazole (PRILOSEC) 20 MG capsule Take 20 mg by mouth  daily. One by mouth Mon Wed Friday  . ondansetron (ZOFRAN) 4 MG tablet Take 4 mg by mouth every 8 (eight) hours as needed for nausea or vomiting.   . rosuvastatin (CRESTOR) 40 MG tablet Take 1 tablet (40 mg total) by mouth daily.  . valACYclovir (VALTREX) 1000 MG tablet Take 1 tablet (1,000 mg total) by mouth daily.  . [DISCONTINUED] ketorolac (ACULAR) 0.5 % ophthalmic solution Place 1 drop into both eyes 4 (four) times daily. (Patient not taking: Reported on 01/02/2016)   No facility-administered encounter medications on file as of 03/22/2016.     Review of Systems  Constitutional: Negative.   HENT: Negative.   Eyes: Negative.   Respiratory: Negative.   Cardiovascular: Negative.   Gastrointestinal: Negative.   Endocrine: Negative.   Genitourinary: Negative.   Musculoskeletal: Positive for back pain (been going to ortho, had injection) and arthralgias (shoulder /neck - left side).  Skin: Negative.   Allergic/Immunologic: Negative.   Neurological: Negative.   Hematological: Negative.   Psychiatric/Behavioral: The patient is nervous/anxious (stress).        Objective:   Physical Exam  Constitutional: She is oriented to person, place, and time. She appears well-developed and well-nourished. She appears distressed.  HENT:  Head: Normocephalic and atraumatic.  Right Ear: External ear normal.  Left Ear: External ear normal.  Mouth/Throat: Oropharynx is clear and moist.  Slight nasal congestion  Eyes: Conjunctivae and EOM are normal. Pupils are equal, round, and reactive to light. Right eye exhibits no discharge. Left eye exhibits no discharge. No scleral icterus.  The right eyelid is somewhat more droopy than the left she is seeing the ophthalmologist about this.  Neck: Normal range of motion. Neck supple. No thyromegaly present.  Cardiovascular: Normal rate, regular rhythm, normal heart sounds and intact distal pulses.   No murmur heard. Heart is regular at 72/m  Pulmonary/Chest:  Effort normal and breath sounds normal. No respiratory distress. She has no wheezes. She has no rales. She exhibits no tenderness.  Clear anteriorly and posteriorly  Abdominal: Soft. Bowel sounds are normal. She exhibits no mass. There is no tenderness. There is no rebound and no guarding.  No abdominal tenderness liver or spleen enlargement  Musculoskeletal: She exhibits no edema or tenderness.  The patient has some weakness with trying to get on the table with her lower extremity. This is worse on the left side.  Lymphadenopathy:    She has no cervical adenopathy.  Neurological: She is alert and oriented to person, place, and time. She has normal reflexes. No cranial nerve deficit.  Skin: Skin is warm and dry. No rash noted.  Psychiatric: She has a normal mood and affect. Her behavior is normal. Judgment and thought content normal.  Nursing note and vitals reviewed.  BP 113/67 mmHg  Pulse 66  Temp(Src) 97.6 F (36.4 C) (Oral)  Ht 5' 8"  (1.727 m)  Wt 184 lb (83.462 kg)  BMI 27.98 kg/m2  Cryotherapy was performed to a tiny seborrheic keratosis in the hairline of the left temple without  problems. [facility] reading (PRIMARY) by  Dr. Kathee Polite spine-pending                                MMSE administered by nurse     Assessment & Plan:  1. Hypertension -The blood pressure is good today and she will continue with current treatment - BMP8+EGFR; Future - CBC with Differential/Platelet; Future - Hepatic function panel; Future  2. Hyperlipidemia -Continue with current treatment pending results of lab work - CBC with Differential/Platelet; Future - NMR, lipoprofile; Future  3. Vitamin D deficiency -Continue with current treatment pending results of lab work - CBC with Differential/Platelet; Future - VITAMIN D 25 Hydroxy (Vit-D Deficiency, Fractures); Future  4. Gastroesophageal reflux disease, esophagitis presence not specified -The patient is doing well with this and she will  reduce or try to reduce taking the omeprazole and changed to ranitidine 150 mg twice daily - CBC with Differential/Platelet; Future  5. Special screening for malignant neoplasms, colon - Fecal occult blood, imunochemical; Future - CBC with Differential/Platelet; Future  6. Coronary artery disease involving native coronary artery of native heart without angina pectoris -Continue yearly follow-up with cardiology  7. Aortic valve stenosis, mild -Continue yearly follow-up with cardiology  8. Allergic rhinitis due to pollen -Continue current treatment and respiratory protection  9. Neck pain - DG Cervical Spine Complete; Future  10. Neoplasm of uncertain behavior -Cryotherapy was performed without problems and patient will call us back if the lesion does not resolve in her left temple hairline.  No orders of the defined types were placed in this encounter.   Patient Instructions                       Medicare Annual Wellness Visit  [facility] and the medical providers at [facility] strive to bring you the best medical care.  In doing so we not only want to address your current medical conditions and concerns but also to detect new conditions early and prevent illness, disease and health-related problems.    Medicare offers a yearly Wellness Visit which allows our clinical staff to assess your need for preventative services including immunizations, lifestyle education, counseling to decrease risk of preventable diseases and screening for fall risk and other medical concerns.    This visit is provided free of charge (no copay) for all Medicare recipients. The clinical pharmacists at [facility] have begun to conduct these Wellness Visits which will also include a thorough review of all your medications.    As you primary medical provider recommend that you make an appointment for your Annual Wellness Visit if you have not done so already this  year.  You may set up this appointment before you leave today or you may call back [phone]) and schedule an appointment.  Please make sure when you call that you mention that you are scheduling your Annual Wellness Visit with the clinical pharmacist so that the appointment may be made for the proper length of time.     Continue current medications. Continue good therapeutic lifestyle changes which include good diet and exercise. Fall precautions discussed with patient. If an FOBT was given today- please return it to our front desk. If you are over 66 years old - you may need Prevnar 74 or the adult Pneumonia vaccine.  **Flu shots are available--- please call and schedule a FLU-CLINIC appointment**  After your  visit with [place] today you will receive a survey in the mail or online from [employer] regarding your care with [place]. Please take a moment to fill this out. Your feedback is very important to [place] as you can help [place] better understand your patient needs as well as improve your experience and satisfaction. WE CARE ABOUT YOU!!!   The patient should consider trying ranitidine or Zantac 150 mg twice daily before breakfast and supper on Monday Wednesday and Friday and so taking the omeprazole If this works for her reflux she should stay on this medication and take or discontinue the omeprazole Continue to follow-up with the ophthalmologist Continue to follow-up with orthopedist Continue to follow-up with cardiology Continue current treatment pending results of lab work   Arrie Senate MD

## 2016-03-23 ENCOUNTER — Other Ambulatory Visit: Payer: Medicare Other

## 2016-03-23 ENCOUNTER — Other Ambulatory Visit: Payer: Self-pay | Admitting: *Deleted

## 2016-03-23 DIAGNOSIS — E559 Vitamin D deficiency, unspecified: Secondary | ICD-10-CM

## 2016-03-23 DIAGNOSIS — Z1211 Encounter for screening for malignant neoplasm of colon: Secondary | ICD-10-CM

## 2016-03-23 DIAGNOSIS — I10 Essential (primary) hypertension: Secondary | ICD-10-CM

## 2016-03-23 DIAGNOSIS — I6529 Occlusion and stenosis of unspecified carotid artery: Secondary | ICD-10-CM

## 2016-03-23 DIAGNOSIS — K219 Gastro-esophageal reflux disease without esophagitis: Secondary | ICD-10-CM

## 2016-03-23 DIAGNOSIS — E785 Hyperlipidemia, unspecified: Secondary | ICD-10-CM

## 2016-03-24 LAB — HEPATIC FUNCTION PANEL
ALT: 14 IU/L (ref 0–32)
AST: 18 IU/L (ref 0–40)
Albumin: 4.2 g/dL (ref 3.6–4.8)
Alkaline Phosphatase: 67 IU/L (ref 39–117)
BILIRUBIN, DIRECT: 0.14 mg/dL (ref 0.00–0.40)
Bilirubin Total: 0.6 mg/dL (ref 0.0–1.2)
TOTAL PROTEIN: 6.6 g/dL (ref 6.0–8.5)

## 2016-03-24 LAB — NMR, LIPOPROFILE
CHOLESTEROL: 133 mg/dL (ref 100–199)
HDL CHOLESTEROL BY NMR: 54 mg/dL (ref >39)
HDL Particle Number: 32.8 umol/L (ref >=30.5)
LDL PARTICLE NUMBER: 877 nmol/L (ref <1000)
LDL SIZE: 20.6 nm (ref >20.5)
LDL-C: 66 mg/dL (ref 0–99)
LP-IR Score: 36 (ref <=45)
Small LDL Particle Number: 531 nmol/L — ABNORMAL HIGH (ref <=527)
TRIGLYCERIDES BY NMR: 66 mg/dL (ref 0–149)

## 2016-03-24 LAB — CBC WITH DIFFERENTIAL/PLATELET
BASOS: 1 %
Basophils Absolute: 0 10*3/uL (ref 0.0–0.2)
EOS (ABSOLUTE): 0.1 10*3/uL (ref 0.0–0.4)
Eos: 2 %
Hematocrit: 39.1 % (ref 34.0–46.6)
Hemoglobin: 13.3 g/dL (ref 11.1–15.9)
IMMATURE GRANS (ABS): 0 10*3/uL (ref 0.0–0.1)
Immature Granulocytes: 0 %
LYMPHS ABS: 1.4 10*3/uL (ref 0.7–3.1)
Lymphs: 31 %
MCH: 31.7 pg (ref 26.6–33.0)
MCHC: 34 g/dL (ref 31.5–35.7)
MCV: 93 fL (ref 79–97)
MONOS ABS: 0.3 10*3/uL (ref 0.1–0.9)
Monocytes: 7 %
NEUTROS ABS: 2.8 10*3/uL (ref 1.4–7.0)
Neutrophils: 59 %
PLATELETS: 244 10*3/uL (ref 150–379)
RBC: 4.2 x10E6/uL (ref 3.77–5.28)
RDW: 14.5 % (ref 12.3–15.4)
WBC: 4.6 10*3/uL (ref 3.4–10.8)

## 2016-03-24 LAB — BMP8+EGFR
BUN / CREAT RATIO: 14 (ref 12–28)
BUN: 10 mg/dL (ref 8–27)
CO2: 24 mmol/L (ref 18–29)
Calcium: 9.7 mg/dL (ref 8.7–10.3)
Chloride: 103 mmol/L (ref 96–106)
Creatinine, Ser: 0.71 mg/dL (ref 0.57–1.00)
GFR, EST AFRICAN AMERICAN: 103 mL/min/{1.73_m2} (ref >59)
GFR, EST NON AFRICAN AMERICAN: 90 mL/min/{1.73_m2} (ref >59)
Glucose: 95 mg/dL (ref 65–99)
POTASSIUM: 3.5 mmol/L (ref 3.5–5.2)
SODIUM: 142 mmol/L (ref 134–144)

## 2016-03-24 LAB — VITAMIN D 25 HYDROXY (VIT D DEFICIENCY, FRACTURES): Vit D, 25-Hydroxy: 62.6 ng/mL (ref 30.0–100.0)

## 2016-03-26 ENCOUNTER — Other Ambulatory Visit: Payer: Self-pay

## 2016-03-26 DIAGNOSIS — IMO0001 Reserved for inherently not codable concepts without codable children: Secondary | ICD-10-CM

## 2016-03-28 ENCOUNTER — Other Ambulatory Visit: Payer: Self-pay | Admitting: Family Medicine

## 2016-03-28 ENCOUNTER — Ambulatory Visit (HOSPITAL_COMMUNITY)
Admission: RE | Admit: 2016-03-28 | Discharge: 2016-03-28 | Disposition: A | Discharge status: Home / Self Care | Payer: Medicare Other | Source: Ambulatory Visit | Attending: Cardiology | Admitting: Cardiology

## 2016-03-28 DIAGNOSIS — E785 Hyperlipidemia, unspecified: Secondary | ICD-10-CM | POA: Insufficient documentation

## 2016-03-28 DIAGNOSIS — I6523 Occlusion and stenosis of bilateral carotid arteries: Secondary | ICD-10-CM | POA: Insufficient documentation

## 2016-03-28 DIAGNOSIS — I10 Essential (primary) hypertension: Secondary | ICD-10-CM | POA: Insufficient documentation

## 2016-03-28 DIAGNOSIS — M6289 Other specified disorders of muscle: Secondary | ICD-10-CM | POA: Insufficient documentation

## 2016-03-28 DIAGNOSIS — R531 Weakness: Secondary | ICD-10-CM

## 2016-03-28 DIAGNOSIS — I2510 Atherosclerotic heart disease of native coronary artery without angina pectoris: Secondary | ICD-10-CM | POA: Insufficient documentation

## 2016-03-28 DIAGNOSIS — I6529 Occlusion and stenosis of unspecified carotid artery: Secondary | ICD-10-CM

## 2016-03-28 DIAGNOSIS — IMO0001 Reserved for inherently not codable concepts without codable children: Secondary | ICD-10-CM

## 2016-04-19 ENCOUNTER — Ambulatory Visit (INDEPENDENT_AMBULATORY_CARE_PROVIDER_SITE_OTHER): Payer: Medicare Other | Admitting: Internal Medicine

## 2016-04-19 ENCOUNTER — Encounter: Payer: Self-pay | Admitting: Internal Medicine

## 2016-04-19 VITALS — BP 128/72 | HR 57 | Ht 68.0 in | Wt 179.6 lb

## 2016-04-19 DIAGNOSIS — J189 Pneumonia, unspecified organism: Secondary | ICD-10-CM

## 2016-04-19 NOTE — Patient Instructions (Signed)
I'm glad your lungs have been doing better.  Please call if we can help.

## 2016-04-19 NOTE — Assessment & Plan Note (Signed)
Concern last year was with recurrent pneumonias. This pattern seems to have ended. She has no respiratory complaints and no recent infection. I have offered to see her again if needed

## 2016-04-19 NOTE — Progress Notes (Signed)
06/22/15- [name] referred courtesy of Dr Laurance Flatten because of recurrent LLL pneumonia Medical hx includes HBP, IBS, allergic rhinitis, CAD Husband here Had community-acquired pneumonia in May and had cleared on follow-up chest x-ray May 31. Left knee surgery under general anesthesia June 5. Was going to physical therapy rehabilitation. Cough began again 2- 3 weeks later and has had some chills and fever. Levaquin restarted yesterday. Cough is productive green and also seeing some green discharge from nose and eye. No history of lung disease including prior pneumonia or TB. 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 Not much reflux currently on omeprazole every other day. Using Flonase and Breo. Has not had pneumonia vaccine. We reviewed the xray images CXR 04/18/15 IMPRESSION: Left lower lobe airspace disease is consistent with pneumonia given leukocytosis and cough. Followup PA and lateral chest X-ray is recommended in 3-4 weeks following trial of antibiotic therapy to ensure resolution and exclude underlying malignancy. Electronically Signed  By: Monte Fantasia M.D.  On: 04/18/2015 15:32 CXR 05/17/15 IMPRESSION: Interval clearing of the left lower lobe pneumonia. There is no active cardiopulmonary disease. Electronically Signed  By: David Martinique M.D.  On: 05/17/2015 11:29 CXR 06/21/15 IMPRESSION: Left lower lobe infiltrate consistent with pneumonia. Followup PA and lateral chest X-ray is recommended in 3-4 weeks following trial of antibiotic therapy to ensure resolution and exclude underlying malignancy. Electronically Signed  By: David Martinique M.D.  On: 06/21/2015 12:48  07/07/15- [name] referred courtesy of Dr Laurance Flatten because of recurrent LLL pneumonia FOLLOWS FOR: Recently went to ER  for Medication reaction- rash (ER unsure  of med-? Codeine vs Levaquin).  Hosp 7/9-7/11 with [facility] Rx'd Zithx 5 d,  and cefdinir x 7 d. Apparently they were looking at the same left lower lobe infiltrate and not a new infection. Discharged with Zithromax and cefdinir. Tessalon Perles did not help much for cough. Cough is more bothersome and feels like throat clearing. Sputum is just clear mucus now. Neg urine for legionella and neg for S. Pneumonia urine antigens. We reviewed x-ray images. CXR 06/25/15-  IMPRESSION: Left lower lobe opacity, atelectasis versus pneumonia, improved. Electronically Signed  By: Julian Hy M.D.  On: 06/25/2015 15:50  10/21/15- 65 year old female never smoker referred courtesy of Dr. Laurance Flatten because of recurrent left lower lobe pneumonia, complicated by CAD, aortic valve stenosis, allergic rhinitis, FOLLOWS FOR: Pt states here recently she has had an increase in dry cough and thinks this is from the change in weather. Pt states overall she feels she is doing better. Pt denies SOB, CP/tightness, f/c/s.  She had had patchy left lower lobe pneumonia  recurrent as of July 5 x-ray, treated by Dr. Laurance Flatten We discussed this pattern of repeated pneumonias in the left lower lobe suggesting an anatomic predisposition rather than generalized immune deficiency. 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 Remote history of allergy vaccine. Of interest, she prefers sleeping on her left side. We discussed potential for reflux into the left lower lobe on this basis. She does not notice active reflux. She emphasizes that she feels fine at this time with no routine cough or chest discomfort. We discussed seasonal vaccinations. Has never had a pneumonia vaccine. She wants to get her flu shot at upcoming visit with her primary  physician and could get Pneumovax 23 at that time. . I reviewed images CXR 08/02/15 IMPRESSION: Resolution  of left lower lobe pneumonia. No acute cardiopulmonary disease currently. Electronically Signed  By: Evangeline Dakin M.D.  On: 08/02/2015 16:32  By 04/05/2016-65 year old female never smoker followed for history recurrent left lower lobe pneumonia complicated by CAD, aortic valve stenosis, allergic rhinitis FOLLOWS FOR: Pt states her breathing is doing well; denies any wheezing, cough, congestion, or SOB.  Good winter with no further significant respiratory infection. Has not needed rescue inhaler at all. Mainly bothered by back and joint pains. Mentions some mild intermittent nonspecific left flank discomfort which might be from nerve root irritation as well.  ROS-see HPI   Negative unless "+" Constitutional:    weight loss, night sweats, fevers, chills, fatigue, lassitude. HEENT:    headaches, difficulty swallowing, tooth/dental problems, sore throat,       sneezing, itching, ear ache, nasal congestion, post nasal drip, snoring CV:    chest pain, orthopnea, PND, swelling in lower extremities, anasarca,                                                 dizziness, palpitations Resp:  + shortness of breath with exertion or at rest.                productive cough,   non-productive cough, coughing up of blood.              change in color of mucus.  wheezing.   Skin:    rash or lesions. GI:  No-   heartburn, indigestion, abdominal pain, nausea, vomiting,  GU:  MS:   + joint pain, stiffness, Neuro-     nothing unusual Psych:  change in mood or affect.  depression or anxiety.   memory loss.  OBJ- Physical Exam General- Alert, Oriented, Affect-appropriate, Distress- none acute Skin- rash-none, lesions- none, excoriation- none Lymphadenopathy- none Head- atraumatic            Eyes- Gross vision intact, PERRLA, conjunctivae and secretions clear            Ears- Hearing, canals-normal            Nose- Clear, no-Septal dev, mucus, polyps, erosion, perforation             Throat- Mallampati  II , mucosa + red without exudate, drainage- none, tonsils- atrophic Neck- flexible , trachea midline, no stridor , thyroid nl, carotid no bruit Chest - symmetrical excursion , unlabored           Heart/CV- RRR ,  Murmur+ 2/6 systolic , no gallop  , no rub, nl s1 s2                           - JVD- none , edema- none, stasis changes- none, varices- none           Lung-clear unlabored, wheeze- none, cough-none , dullness-none, rub- none           Chest wall-  Abd- + soft without guarding, tenderness, organomegaly Br/ Gen/ Rectal- Not done, not indicated Extrem- cyanosis- none, clubbing, none, atrophy- none, strength- nl Neuro- grossly intact to observation

## 2016-05-15 ENCOUNTER — Other Ambulatory Visit: Payer: Self-pay | Admitting: Family Medicine

## 2016-06-07 ENCOUNTER — Encounter: Payer: Self-pay | Admitting: *Deleted

## 2016-06-20 NOTE — Progress Notes (Signed)
Cardiology Office Note Date:  06/23/2016   ID:  Charlann, Buitrago [birth year], MRN [id number]  PCP:  Redge Gainer, MD  Cardiologist:  Sherren Mocha, MD    Chief Complaint  Patient presents with  . Coronary Artery Disease    History of Present Illness: Shelly Bennett is a 65 y.o. female who presents for follow-up of CAD. She underwent cardiac cath in 2005 and was noted to have moderate stenosis of the mid-LAD with medical therapy recommended. She's had no ischemic events and at the time she presented with resting chest pain felt to be unrelated to her CAD. She has also been diagnosed with a bicuspid aortic valve. Other cardiovascular problems include hypertension and hyperlipidemia.   Shelly Bennett is here alone today. She reports no cardiac problems and denies chest pain, dyspnea, or edema. She's been under a great deal of stress with her Mother's declining health and dementia. She has had problems with back and leg pain as well as gait unsteadiness at times. She's been unable to exercise because of these problems.    Past Medical History  Diagnosis Date  . Arthritis   . Chronic headaches   . Adenomatous colon polyp   . CAD (coronary artery disease)     Dr. Burt Knack follows   . HLD (hyperlipidemia)   . HTN (hypertension)   . Diverticulosis   . IBS (irritable bowel syndrome)   . Obesity   . External hemorrhoids   . Cystocele   . Pneumonia     Past Surgical History  Procedure Laterality Date  . Knee surgery  05/22/2015    right  . Carpal tunnel release    . Colonoscopy  12/15/2008    diverticulosis, external hemorrhoids  . Laparoscopy      Current Outpatient Prescriptions  Medication Sig Dispense Refill  . albuterol (PROVENTIL HFA;VENTOLIN HFA) 108 (90 BASE) MCG/ACT inhaler Inhale 2 puffs into the lungs every 6 (six) hours as needed for wheezing or shortness of breath. 1 Inhaler 11  . amLODipine (NORVASC) 10 MG tablet TAKE ONE TABLET BY MOUTH ONE TIME DAILY 30 tablet 5    . aspirin 81 MG tablet Take 81 mg by mouth daily.     . calcium carbonate 200 MG capsule Take 250 mg by mouth 2 (two) times daily.     . Cholecalciferol (VITAMIN D-3) 5000 UNITS TABS Take 1 capsule by mouth as directed. Take one capsule by mouth daily Mon thru Friday    . desonide (DESOWEN) 0.05 % cream Apply 1 application topically as needed (face irritation).     . fexofenadine (ALLEGRA) 180 MG tablet Take 180 mg by mouth daily.    . fluticasone (FLONASE) 50 MCG/ACT nasal spray Place 2 sprays into both nostrils daily.    . hydrochlorothiazide (HYDRODIURIL) 25 MG tablet TAKE ONE TABLET BY MOUTH ONE TIME DAILY 30 tablet 5  . HYDROcodone-acetaminophen (NORCO/VICODIN) 5-325 MG per tablet Take 0.5 tablets by mouth at bedtime as needed for moderate pain.     Marland Kitchen omega-3 acid ethyl esters (LOVAZA) 1 g capsule TAKE  (1)  CAPSULE  FOUR TIMES DAILY. 120 capsule 3  . omeprazole (PRILOSEC) 20 MG capsule Take 20 mg by mouth 3 (three) times a week. One by mouth Mon Wed Friday    . ondansetron (ZOFRAN) 4 MG tablet Take 4 mg by mouth every 8 (eight) hours as needed for nausea or vomiting.     . rosuvastatin (CRESTOR) 40 MG tablet Take 1 tablet (40  mg total) by mouth daily. 90 tablet 0  . valACYclovir (VALTREX) 1000 MG tablet Take 1 tablet (1,000 mg total) by mouth daily. 30 tablet 1   No current facility-administered medications for this visit.    Allergies:   Codeine; Mold extract; Penicillins; Ace inhibitors; Angiotensin receptor blockers; Levaquin; Vytorin; and Zetia   Social History:  The patient  reports that she has never smoked. She has never used smokeless tobacco. She reports that she does not drink alcohol or use illicit drugs.   Family History:  The patient's  family history includes Colon cancer (age of onset: 1) in her father; Heart disease in her father and maternal grandfather; Heart disease (age of onset: 82) in her brother; Heart murmur in her mother; Hyperlipidemia in her mother and sister;  Hypertension in her mother and sister; Kidney disease in her father and mother; Osteoporosis in her mother; Prostate cancer in her father.    ROS:  Please see the history of present illness.  All other systems are reviewed and negative.    PHYSICAL EXAM: VS:  BP 132/70 mmHg  Pulse 62  Ht 5\' 8"  (1.727 m)  Wt 178 lb 12.8 oz (81.103 kg)  BMI 27.19 kg/m2 , BMI Body mass index is 27.19 kg/(m^2). GEN: Well nourished, well developed, in no acute distress HEENT: normal Neck: no JVD, no masses. No carotid bruits Cardiac: RRR without murmur or gallop                Respiratory:  clear to auscultation bilaterally, normal work of breathing GI: soft, nontender, nondistended, + BS MS: no deformity or atrophy Ext: no pretibial edema, pedal pulses 2+= bilaterally Skin: warm and dry, no rash Neuro:  Strength and sensation are intact Psych: euthymic mood, full affect  EKG:  EKG is ordered today. The ekg ordered today shows NSR 63 bpm, within normal limits  Recent Labs: 07/11/2015: Hemoglobin 12.3 03/23/2016: ALT 14; BUN 10; Creatinine, Ser 0.71; Platelets 244; Potassium 3.5; Sodium 142   Lipid Panel     Component Value Date/Time   CHOL 133 03/23/2016 0930   TRIG 66 03/23/2016 0930   HDL 54 03/23/2016 0930   LDLCALC 74 08/19/2014 0920      Wt Readings from Last 3 Encounters:  06/21/16 178 lb 12.8 oz (81.103 kg)  04/19/16 179 lb 9.6 oz (81.466 kg)  03/22/16 184 lb (83.462 kg)     Cardiac Studies Reviewed: Carotid Duplex April 11, 2016: <40% bilateral ICA stenosis  2D Echo 04/22/2015: Study Conclusions  - Left ventricle: The cavity size was normal. Wall thickness was normal. Systolic function was normal. The estimated ejection fraction was in the range of 60% to 65%. Wall motion was normal; there were no regional wall motion abnormalities. Features are consistent with a pseudonormal left ventricular filling pattern, with concomitant abnormal relaxation and increased  filling pressure (grade 2 diastolic dysfunction). - Aortic valve: A bicuspid morphology cannot be excluded; mildly thickened, moderately calcified leaflets. Cusp separation was reduced. There was very mild stenosis. Peak velocity (S): 223 cm/s. Mean gradient (S): 11 mm Hg. - Mitral valve: There was mild regurgitation.  ASSESSMENT AND PLAN: 1.  CAD, native vessel, without angina: continue secondary risk reduction. Medical program reviewed and no changes recommended.   2. Bicuspid aortic valve: mild aortic stenosis without symptoms. Reviewed natural history today. Will repeat echo prior to next year's visit. Last echo study reviewed.  3. Essential HTN: BP controlled on amlodipine and HCTZ.  4. Hyperlipidemia: Lipids reviewed and at  goal on crestor 40 mg. Followed by Dr Laurance Flatten.  Overall stable from cardiac perspective. Discussed importance of exercise for both physical health and coping with stress. Pool exercise reviewed in context of her orthopedic problems.  Current medicines are reviewed with the patient today.  The patient does not have concerns regarding medicines.  Labs/ tests ordered today include:   Orders Placed This Encounter  Procedures  . EKG 12-Lead  . ECHO COMPLETE    Disposition:   FU one year  Signed, Sherren Mocha, MD  06/23/2016 5:48 AM    [facility] [address], [place], [place]  [address] Phone: [phone]; Fax: [phone]

## 2016-06-21 ENCOUNTER — Encounter: Payer: Self-pay | Admitting: Cardiovascular Disease

## 2016-06-21 ENCOUNTER — Ambulatory Visit (INDEPENDENT_AMBULATORY_CARE_PROVIDER_SITE_OTHER): Payer: Medicare Other | Admitting: Cardiovascular Disease

## 2016-06-21 VITALS — BP 132/70 | HR 62 | Ht 68.0 in | Wt 178.8 lb

## 2016-06-21 DIAGNOSIS — I10 Essential (primary) hypertension: Secondary | ICD-10-CM

## 2016-06-21 DIAGNOSIS — I350 Nonrheumatic aortic (valve) stenosis: Secondary | ICD-10-CM

## 2016-06-21 NOTE — Patient Instructions (Signed)

## 2016-07-18 ENCOUNTER — Other Ambulatory Visit: Payer: Self-pay | Admitting: Family Medicine

## 2016-08-01 ENCOUNTER — Encounter: Payer: Self-pay | Admitting: Family Medicine

## 2016-08-01 ENCOUNTER — Ambulatory Visit (INDEPENDENT_AMBULATORY_CARE_PROVIDER_SITE_OTHER): Payer: Medicare Other | Admitting: Family Medicine

## 2016-08-01 VITALS — BP 122/68 | HR 64 | Temp 97.6°F | Ht 68.0 in | Wt 179.0 lb

## 2016-08-01 DIAGNOSIS — Z800 Family history of malignant neoplasm of digestive organs: Secondary | ICD-10-CM | POA: Diagnosis not present

## 2016-08-01 DIAGNOSIS — Z1211 Encounter for screening for malignant neoplasm of colon: Secondary | ICD-10-CM | POA: Diagnosis not present

## 2016-08-01 DIAGNOSIS — I2510 Atherosclerotic heart disease of native coronary artery without angina pectoris: Secondary | ICD-10-CM | POA: Diagnosis not present

## 2016-08-01 DIAGNOSIS — K219 Gastro-esophageal reflux disease without esophagitis: Secondary | ICD-10-CM

## 2016-08-01 DIAGNOSIS — I10 Essential (primary) hypertension: Secondary | ICD-10-CM

## 2016-08-01 DIAGNOSIS — E559 Vitamin D deficiency, unspecified: Secondary | ICD-10-CM

## 2016-08-01 DIAGNOSIS — E785 Hyperlipidemia, unspecified: Secondary | ICD-10-CM | POA: Diagnosis not present

## 2016-08-01 DIAGNOSIS — F418 Other specified anxiety disorders: Secondary | ICD-10-CM

## 2016-08-01 DIAGNOSIS — R1012 Left upper quadrant pain: Secondary | ICD-10-CM | POA: Diagnosis not present

## 2016-08-01 NOTE — Addendum Note (Signed)
Addended by: Zannie Cove on: 08/01/2016 12:13 PM   Modules accepted: Orders

## 2016-08-01 NOTE — Progress Notes (Signed)
Subjective:    Patient ID: Shelly Bennett, female    DOB: 1951-09-09, 65 y.o.   MRN: [id number]  HPI Pt here for follow up and management of chronic medical problems which includes hyperlipidemia and hypertensin. She is taking medications regularly.This patient is dealing with a lot of family stress with her children her elderly mother. This has made her very nervous and anxious. She is very devoted to her family. She does complain today of some abdominal pain on the left side. She also is concerned about some fibromyalgia. She will get lab work today. She is followed regularly by the cardiologist. She will get her pelvic and mammogram done in November of this year when they are due.. The patient also has seen the cardiologist one to 2 months ago and he gave her a good report. She is extremely stressed and we discussed some of the factors causing her stress during the visit. She does have a lot of muscle aches and myalgias and this is most likely coming from her back with radiation down her legs. She denies any chest pain or shortness of breath anymore than usual. She is been taking Zantac for her heartburn and this works very well and she is off the proton pump inhibitor. She is not seeing any blood in the stool or had any black tarry bowel movements. She is due for next colonoscopy in 2020 which is 5 years after her previous one. She will get lab work done today. She has no trouble passing her water. She is not seeing any blood in the stool.     Patient Active Problem List   Diagnosis Date Noted  . Allergic drug reaction 06/25/2015  . Dehydration with hyponatremia 06/25/2015  . HCAP (healthcare-associated pneumonia) 06/25/2015  . Vitamin D deficiency 08/19/2014  . Family history of colon cancer-father at 62+ grandparent w/ rectal cancer 01/14/2014  . Osteopenia 08/12/2013  . CAD (coronary artery disease), native coronary artery 03/22/2013  . Aortic valve stenosis, mild 03/22/2013  .  Hyperlipemia 03/11/2013  . Hypertension 03/11/2013  . Allergic rhinitis 03/11/2013  . Left shoulder pain 03/11/2013  . IBS (irritable bowel syndrome) - with chronic recurrent abdominal pain 02/01/2012  . Personal history of colonic polyps - adenoma 02/01/2012  . Coccydynia 02/01/2012   Outpatient Encounter Prescriptions as of 08/01/2016  Medication Sig  . albuterol (PROVENTIL HFA;VENTOLIN HFA) 108 (90 BASE) MCG/ACT inhaler Inhale 2 puffs into the lungs every 6 (six) hours as needed for wheezing or shortness of breath.  Marland Kitchen amLODipine (NORVASC) 10 MG tablet TAKE 1 TABLET ONCE A DAY  . aspirin 81 MG tablet Take 81 mg by mouth daily.   . calcium carbonate 200 MG capsule Take 250 mg by mouth 2 (two) times daily.   . Cholecalciferol (VITAMIN D-3) 5000 UNITS TABS Take 1 capsule by mouth as directed. Take one capsule by mouth daily Mon thru Friday  . desonide (DESOWEN) 0.05 % cream Apply 1 application topically as needed (face irritation).   . fexofenadine (ALLEGRA) 180 MG tablet Take 180 mg by mouth daily.  . fluticasone (FLONASE) 50 MCG/ACT nasal spray Place 2 sprays into both nostrils daily.  . hydrochlorothiazide (HYDRODIURIL) 25 MG tablet TAKE ONE TABLET BY MOUTH ONE TIME DAILY  . HYDROcodone-acetaminophen (NORCO/VICODIN) 5-325 MG per tablet Take 0.5 tablets by mouth at bedtime as needed for moderate pain.   Marland Kitchen omega-3 acid ethyl esters (LOVAZA) 1 g capsule TAKE  (1)  CAPSULE  FOUR TIMES DAILY.  Marland Kitchen ondansetron (  ZOFRAN) 4 MG tablet Take 4 mg by mouth every 8 (eight) hours as needed for nausea or vomiting.   . ranitidine (ZANTAC) 150 MG tablet Take 150 mg by mouth at bedtime.  . rosuvastatin (CRESTOR) 40 MG tablet Take 1 tablet (40 mg total) by mouth daily.  . valACYclovir (VALTREX) 1000 MG tablet Take 1 tablet (1,000 mg total) by mouth daily.  . [DISCONTINUED] omeprazole (PRILOSEC) 20 MG capsule Take 20 mg by mouth 3 (three) times a week. One by mouth Mon Wed Friday   No facility-administered  encounter medications on file as of 08/01/2016.       Review of Systems  Constitutional: Negative.   HENT: Negative.   Eyes: Negative.   Respiratory: Negative.   Cardiovascular: Negative.   Gastrointestinal: Positive for abdominal pain (left side abd pain).  Endocrine: Negative.   Genitourinary: Negative.   Musculoskeletal: Negative.   Skin: Negative.   Allergic/Immunologic: Negative.   Neurological: Negative.   Hematological: Negative.   Psychiatric/Behavioral: The patient is nervous/anxious (stress).        Objective:   Physical Exam  Constitutional: She is oriented to person, place, and time. She appears well-developed and well-nourished. She appears distressed.  The patient is very anxious and stressed today because of family issues that are going on.  HENT:  Head: Normocephalic and atraumatic.  Right Ear: External ear normal.  Left Ear: External ear normal.  Mouth/Throat: Oropharynx is clear and moist. No oropharyngeal exudate.  Nasal congestion bilaterally  Eyes: Conjunctivae and EOM are normal. Pupils are equal, round, and reactive to light. Right eye exhibits no discharge. Left eye exhibits no discharge. No scleral icterus.  Slight swelling around the right orbit patient is considering a blepharoplasty.  Neck: Normal range of motion. Neck supple. No thyromegaly present.  No bruits thyromegaly or anterior cervical adenopathy  Cardiovascular: Normal rate, regular rhythm, normal heart sounds and intact distal pulses.   No murmur heard. Heart has a regular rate and rhythm at 72/m  Pulmonary/Chest: Effort normal and breath sounds normal. No respiratory distress. She has no wheezes. She has no rales.  Abdominal: Soft. Bowel sounds are normal. She exhibits no mass. There is tenderness. There is no rebound and no guarding.  There is tenderness in the left upper quadrant without organ enlargement or bruits or inguinal adenopathy  Musculoskeletal: Normal range of motion. She  exhibits no edema.  Lymphadenopathy:    She has no cervical adenopathy.  Neurological: She is alert and oriented to person, place, and time. She has normal reflexes. No cranial nerve deficit.  Skin: Skin is warm and dry. No rash noted.  Psychiatric: She has a normal mood and affect. Her behavior is normal. Judgment and thought content normal.  The patient is somewhat depressed and tearful today.  Nursing note and vitals reviewed.   BP 122/68 (BP Location: Left Arm)   Pulse 64   Temp 97.6 F (36.4 C) (Oral)   Ht 5' 8"  (1.727 m)   Wt 179 lb (81.2 kg)   BMI 27.22 kg/m        Assessment & Plan:  1. Hyperlipidemia -Continue with current treatment pending results of lab work. Continue with aggressive therapeutic lifestyle changes. - CBC with Differential/Platelet - NMR, lipoprofile  2. Hypertension -The blood pressure is good today and she should continue with current treatment - BMP8+EGFR - CBC with Differential/Platelet - Hepatic function panel  3. Vitamin D deficiency -The patient has a history of fractures. She should make sure that  she is taking her calcium and vitamin D regularly. We will check a vitamin D level and let her know if she is getting adequate amounts of vitamin D - CBC with Differential/Platelet - VITAMIN D 25 Hydroxy (Vit-D Deficiency, Fractures)  4. Gastroesophageal reflux disease, esophagitis presence not specified -The ranitidine is working well for her reflux and she will continue with this - CBC with Differential/Platelet - Hepatic function panel  5. LUQ pain - US Abdomen Complete; Future  6. Family history of colon cancer -The patient's next colonoscopy is not due until 2020. - US Abdomen Complete; Future  7. Anxiety associated with depression -We have encouraged her to walk and exercise more regularly and certainly to avoid caffeine intake  8. Left upper quadrant pain -Abdominal ultrasound  9. Arteriosclerotic cardiovascular disease  (ASCVD) -Follow-up with cardiology regularly and continue to take statin drug and eat healthy  Patient Instructions                       Medicare Annual Wellness Visit  [facility] and the medical providers at [facility] strive to bring you the best medical care.  In doing so we not only want to address your current medical conditions and concerns but also to detect new conditions early and prevent illness, disease and health-related problems.    Medicare offers a yearly Wellness Visit which allows our clinical staff to assess your need for preventative services including immunizations, lifestyle education, counseling to decrease risk of preventable diseases and screening for fall risk and other medical concerns.    This visit is provided free of charge (no copay) for all Medicare recipients. The clinical pharmacists at [facility] have begun to conduct these Wellness Visits which will also include a thorough review of all your medications.    As you primary medical provider recommend that you make an appointment for your Annual Wellness Visit if you have not done so already this year.  You may set up this appointment before you leave today or you may call back ([phone]) and schedule an appointment.  Please make sure when you call that you mention that you are scheduling your Annual Wellness Visit with the clinical pharmacist so that the appointment may be made for the proper length of time.      Continue current medications. Continue good therapeutic lifestyle changes which include good diet and exercise. Fall precautions discussed with patient. If an FOBT was given today- please return it to our front desk. If you are over 89 years old - you may need Prevnar 62 or the adult Pneumonia vaccine.  **Flu shots are available--- please call and schedule a FLU-CLINIC appointment**  After your visit with [place] today you will receive a survey in the mail  or online from [employer] regarding your care with [place]. Please take a moment to fill this out. Your feedback is very important to [place] as you can help [place] better understand your patient needs as well as improve your experience and satisfaction. WE CARE ABOUT YOU!!!    The patient should continue to follow-up with cardiology We will arrange for her to have an abdominal ultrasound because of the left upper quadrant pain We will continue to encourage her to pray and have faith that some of the problems will work themselves out    Arrie Senate MD

## 2016-08-01 NOTE — Patient Instructions (Addendum)
Medicare Annual Wellness Visit  [facility] and the medical providers at [facility] strive to bring you the best medical care.  In doing so we not only want to address your current medical conditions and concerns but also to detect new conditions early and prevent illness, disease and health-related problems.    Medicare offers a yearly Wellness Visit which allows our clinical staff to assess your need for preventative services including immunizations, lifestyle education, counseling to decrease risk of preventable diseases and screening for fall risk and other medical concerns.    This visit is provided free of charge (no copay) for all Medicare recipients. The clinical pharmacists at [facility] have begun to conduct these Wellness Visits which will also include a thorough review of all your medications.    As you primary medical provider recommend that you make an appointment for your Annual Wellness Visit if you have not done so already this year.  You may set up this appointment before you leave today or you may call back [phone] and schedule an appointment.  Please make sure when you call that you mention that you are scheduling your Annual Wellness Visit with the clinical pharmacist so that the appointment may be made for the proper length of time.      Continue current medications. Continue good therapeutic lifestyle changes which include good diet and exercise. Fall precautions discussed with patient. If an FOBT was given today- please return it to our front desk. If you are over 78 years old - you may need Prevnar 40 or the adult Pneumonia vaccine.  **Flu shots are available--- please call and schedule a FLU-CLINIC appointment**  After your visit with [place] today you will receive a survey in the mail or online from [employer] regarding your care with [place]. Please take a moment to fill this out. Your feedback is very  important to [place] as you can help [place] better understand your patient needs as well as improve your experience and satisfaction. WE CARE ABOUT YOU!!!    The patient should continue to follow-up with cardiology We will arrange for her to have an abdominal ultrasound because of the left upper quadrant pain We will continue to encourage her to pray and have faith that some of the problems will work themselves out

## 2016-08-02 LAB — VITAMIN D 25 HYDROXY (VIT D DEFICIENCY, FRACTURES): VIT D 25 HYDROXY: 55.8 ng/mL (ref 30.0–100.0)

## 2016-08-02 LAB — BMP8+EGFR
BUN / CREAT RATIO: 14 (ref 12–28)
BUN: 10 mg/dL (ref 8–27)
CO2: 23 mmol/L (ref 18–29)
CREATININE: 0.71 mg/dL (ref 0.57–1.00)
Calcium: 10 mg/dL (ref 8.7–10.3)
Chloride: 102 mmol/L (ref 96–106)
GFR calc Af Amer: 103 mL/min/{1.73_m2} (ref >59)
GFR calc non Af Amer: 90 mL/min/{1.73_m2} (ref >59)
GLUCOSE: 87 mg/dL (ref 65–99)
Potassium: 4.1 mmol/L (ref 3.5–5.2)
Sodium: 142 mmol/L (ref 134–144)

## 2016-08-02 LAB — HEPATIC FUNCTION PANEL
ALBUMIN: 4.4 g/dL (ref 3.6–4.8)
ALT: 13 IU/L (ref 0–32)
AST: 18 IU/L (ref 0–40)
Alkaline Phosphatase: 77 IU/L (ref 39–117)
Bilirubin Total: 0.7 mg/dL (ref 0.0–1.2)
Bilirubin, Direct: 0.18 mg/dL (ref 0.00–0.40)
TOTAL PROTEIN: 6.7 g/dL (ref 6.0–8.5)

## 2016-08-02 LAB — CBC WITH DIFFERENTIAL/PLATELET
Basophils Absolute: 0 10*3/uL (ref 0.0–0.2)
Basos: 0 %
EOS (ABSOLUTE): 0.1 10*3/uL (ref 0.0–0.4)
EOS: 1 %
HEMATOCRIT: 40.6 % (ref 34.0–46.6)
HEMOGLOBIN: 13.7 g/dL (ref 11.1–15.9)
IMMATURE GRANS (ABS): 0 10*3/uL (ref 0.0–0.1)
IMMATURE GRANULOCYTES: 0 %
LYMPHS ABS: 1.7 10*3/uL (ref 0.7–3.1)
Lymphs: 25 %
MCH: 32.5 pg (ref 26.6–33.0)
MCHC: 33.7 g/dL (ref 31.5–35.7)
MCV: 96 fL (ref 79–97)
MONOCYTES: 7 %
Monocytes Absolute: 0.5 10*3/uL (ref 0.1–0.9)
Neutrophils Absolute: 4.6 10*3/uL (ref 1.4–7.0)
Neutrophils: 67 %
PLATELETS: 273 10*3/uL (ref 150–379)
RBC: 4.22 x10E6/uL (ref 3.77–5.28)
RDW: 13.9 % (ref 12.3–15.4)
WBC: 6.8 10*3/uL (ref 3.4–10.8)

## 2016-08-02 LAB — NMR, LIPOPROFILE
Cholesterol: 167 mg/dL (ref 100–199)
HDL CHOLESTEROL BY NMR: 68 mg/dL (ref >39)
HDL Particle Number: 41.8 umol/L (ref >=30.5)
LDL PARTICLE NUMBER: 924 nmol/L (ref <1000)
LDL Size: 21 nm (ref >20.5)
LDL-C: 75 mg/dL (ref 0–99)
LP-IR Score: 27 (ref <=45)
Small LDL Particle Number: 542 nmol/L — ABNORMAL HIGH (ref <=527)
TRIGLYCERIDES BY NMR: 119 mg/dL (ref 0–149)

## 2016-08-06 LAB — FECAL OCCULT BLOOD, IMMUNOCHEMICAL: FECAL OCCULT BLD: NEGATIVE

## 2016-08-07 ENCOUNTER — Ambulatory Visit (HOSPITAL_COMMUNITY)
Admission: RE | Admit: 2016-08-07 | Discharge: 2016-08-07 | Disposition: A | Discharge status: Home / Self Care | Payer: Medicare Other | Source: Ambulatory Visit | Attending: Family Medicine | Admitting: Family Medicine

## 2016-08-07 DIAGNOSIS — R1012 Left upper quadrant pain: Secondary | ICD-10-CM | POA: Insufficient documentation

## 2016-08-07 DIAGNOSIS — Z800 Family history of malignant neoplasm of digestive organs: Secondary | ICD-10-CM | POA: Insufficient documentation

## 2016-09-03 ENCOUNTER — Other Ambulatory Visit: Payer: Self-pay | Admitting: Family Medicine

## 2016-09-15 ENCOUNTER — Ambulatory Visit (INDEPENDENT_AMBULATORY_CARE_PROVIDER_SITE_OTHER): Payer: Medicare Other

## 2016-09-15 DIAGNOSIS — Z23 Encounter for immunization: Secondary | ICD-10-CM

## 2016-09-20 ENCOUNTER — Other Ambulatory Visit: Payer: Self-pay | Admitting: Family Medicine

## 2016-10-04 ENCOUNTER — Other Ambulatory Visit: Payer: Self-pay | Admitting: Family Medicine

## 2016-10-10 ENCOUNTER — Telehealth: Payer: Self-pay | Admitting: Family Medicine

## 2016-11-17 ENCOUNTER — Encounter: Payer: Self-pay | Admitting: Family Medicine

## 2016-11-17 ENCOUNTER — Ambulatory Visit (INDEPENDENT_AMBULATORY_CARE_PROVIDER_SITE_OTHER): Payer: Medicare Other | Admitting: Family Medicine

## 2016-11-17 VITALS — BP 124/73 | HR 60 | Temp 98.4°F | Ht 68.0 in | Wt 187.0 lb

## 2016-11-17 DIAGNOSIS — J209 Acute bronchitis, unspecified: Secondary | ICD-10-CM

## 2016-11-17 DIAGNOSIS — J0100 Acute maxillary sinusitis, unspecified: Secondary | ICD-10-CM | POA: Diagnosis not present

## 2016-11-17 MED ORDER — DOXYCYCLINE HYCLATE 100 MG PO TABS: 100.0000 mg | ORAL_TABLET | Freq: Two times a day (BID) | ORAL | 0 refills | Status: DC

## 2016-11-17 NOTE — Progress Notes (Signed)
   HPI  Patient presents today here with acute illness.  Patient complains of 4 days of cough active of thick green and yellow sputum, wheezing, nasal congestion, body aches and chills with subjective fever, and increased dyspnea.  Patient states that there is a sick person similarly ill at church on Wednesday, 4 days ago She's very concerned because she had severe pneumonia last year and had to go the hospital for this.  Patient also has maxillary sinus pain and pressure.  She denies chest pain or difficulty tolerating foods or fluids.  PMH: Smoking status noted ROS: Per HPI  Objective: BP 124/73   Pulse 60   Temp 98.4 F (36.9 C) (Oral)   Ht 5\' 8"  (1.727 m)   Wt 187 lb (84.8 kg)   BMI 28.43 kg/m  Gen: NAD, alert, cooperative with exam HEENT: NCAT, maxillary sinus tenderness to palpation bilaterally, nares clear, TMs normal bilaterally with some erythema on the left TM CV: RRR, good S1/S2, no murmur Resp: CTABL, no wheezes, non-labored, severe recurrent cough Abd: SNTND, BS present, no guarding or organomegaly Ext: No edema, warm Neuro: Alert and oriented, No gross deficits  Assessment and plan:  # Axillary sinusitis, acute bronchitis Patient with clear signs of sinusitis that I'm treating with doxycycline, I am concerned with her productive cough and systemic symptoms that she's developing pneumonia. Discussed supportive care including rest, fluids, Tylenol. Low threshold for return if symptoms worsen. Encouraged her to try albuterol and Mucinex DM for cough.     Meds ordered this encounter  Medications  . doxycycline (VIBRA-TABS) 100 MG tablet    Sig: Take 1 tablet (100 mg total) by mouth 2 (two) times daily.    Dispense:  20 tablet    Refill:  0    Laroy Apple, MD [facility] 11/17/2016, 8:45 AM

## 2016-11-17 NOTE — Patient Instructions (Signed)
Great to meet you!  Get plenty of fluids and rest. Tylenol can help the malaise and fever symptoms a lot.   Finish all antibiotics  Try the albuterol 3-4 times daily for cough    Sinusitis, Adult Sinusitis is soreness and inflammation of your sinuses. Sinuses are hollow spaces in the bones around your face. They are located:  Around your eyes.  In the middle of your forehead.  Behind your nose.  In your cheekbones. Your sinuses and nasal passages are lined with a stringy fluid (mucus). Mucus normally drains out of your sinuses. When your nasal tissues get inflamed or swollen, the mucus can get trapped or blocked so air cannot flow through your sinuses. This lets bacteria, viruses, and funguses grow, and that leads to infection. Follow these instructions at home: Medicines  Take, use, or apply over-the-counter and prescription medicines only as told by your doctor. These may include nasal sprays.  If you were prescribed an antibiotic medicine, take it as told by your doctor. Do not stop taking the antibiotic even if you start to feel better. Hydrate and Humidify  Drink enough water to keep your pee (urine) clear or pale yellow.  Use a cool mist humidifier to keep the humidity level in your home above 50%.  Breathe in steam for 10-15 minutes, 3-4 times a day or as told by your doctor. You can do this in the bathroom while a hot shower is running.  Try not to spend time in cool or dry air. Rest  Rest as much as possible.  Sleep with your head raised (elevated).  Make sure to get enough sleep each night. General instructions  Put a warm, moist washcloth on your face 3-4 times a day or as told by your doctor. This will help with discomfort.  Wash your hands often with soap and water. If there is no soap and water, use hand sanitizer.  Do not smoke. Avoid being around people who are smoking (secondhand smoke).  Keep all follow-up visits as told by your doctor. This is  important. Contact a doctor if:  You have a fever.  Your symptoms get worse.  Your symptoms do not get better within 10 days. Get help right away if:  You have a very bad headache.  You cannot stop throwing up (vomiting).  You have pain or swelling around your face or eyes.  You have trouble seeing.  You feel confused.  Your neck is stiff.  You have trouble breathing. This information is not intended to replace advice given to you by your health care provider. Make sure you discuss any questions you have with your health care provider. Document Released: 05/21/2008 Document Revised: 07/29/2016 Document Reviewed: 09/28/2015 Elsevier Interactive Patient Education  2017 [employer].

## 2016-12-04 ENCOUNTER — Encounter: Payer: Self-pay | Admitting: Family Medicine

## 2016-12-04 ENCOUNTER — Ambulatory Visit (INDEPENDENT_AMBULATORY_CARE_PROVIDER_SITE_OTHER): Payer: Medicare Other | Admitting: Family Medicine

## 2016-12-04 ENCOUNTER — Ambulatory Visit (INDEPENDENT_AMBULATORY_CARE_PROVIDER_SITE_OTHER): Payer: Medicare Other

## 2016-12-04 VITALS — BP 139/69 | HR 60 | Temp 97.9°F | Ht 68.0 in | Wt 186.5 lb

## 2016-12-04 DIAGNOSIS — K219 Gastro-esophageal reflux disease without esophagitis: Secondary | ICD-10-CM

## 2016-12-04 DIAGNOSIS — R0989 Other specified symptoms and signs involving the circulatory and respiratory systems: Secondary | ICD-10-CM | POA: Diagnosis not present

## 2016-12-04 DIAGNOSIS — E7800 Pure hypercholesterolemia, unspecified: Secondary | ICD-10-CM | POA: Diagnosis not present

## 2016-12-04 DIAGNOSIS — E559 Vitamin D deficiency, unspecified: Secondary | ICD-10-CM

## 2016-12-04 DIAGNOSIS — R194 Change in bowel habit: Secondary | ICD-10-CM

## 2016-12-04 DIAGNOSIS — I10 Essential (primary) hypertension: Secondary | ICD-10-CM

## 2016-12-04 DIAGNOSIS — I2510 Atherosclerotic heart disease of native coronary artery without angina pectoris: Secondary | ICD-10-CM

## 2016-12-04 DIAGNOSIS — R300 Dysuria: Secondary | ICD-10-CM

## 2016-12-04 DIAGNOSIS — R1084 Generalized abdominal pain: Secondary | ICD-10-CM | POA: Diagnosis not present

## 2016-12-04 DIAGNOSIS — I700 Atherosclerosis of aorta: Secondary | ICD-10-CM | POA: Insufficient documentation

## 2016-12-04 DIAGNOSIS — R140 Abdominal distension (gaseous): Secondary | ICD-10-CM

## 2016-12-04 DIAGNOSIS — Z800 Family history of malignant neoplasm of digestive organs: Secondary | ICD-10-CM | POA: Diagnosis not present

## 2016-12-04 LAB — URINALYSIS, COMPLETE
Bilirubin, UA: NEGATIVE
Glucose, UA: NEGATIVE
Ketones, UA: NEGATIVE
Nitrite, UA: NEGATIVE
PH UA: 7 (ref 5.0–7.5)
Protein, UA: NEGATIVE
Specific Gravity, UA: 1.015 (ref 1.005–1.030)
Urobilinogen, Ur: 0.2 mg/dL (ref 0.2–1.0)

## 2016-12-04 LAB — MICROSCOPIC EXAMINATION
Bacteria, UA: NONE SEEN
RENAL EPITHEL UA: NONE SEEN /HPF

## 2016-12-04 MED ORDER — ALBUTEROL SULFATE HFA 108 (90 BASE) MCG/ACT IN AERS: 2.0000 | INHALATION_SPRAY | Freq: Four times a day (QID) | RESPIRATORY_TRACT | 11 refills | Status: DC | PRN

## 2016-12-04 MED ORDER — FLUTICASONE FUROATE-VILANTEROL 100-25 MCG/INH IN AEPB: 1.0000 | INHALATION_SPRAY | Freq: Every day | RESPIRATORY_TRACT | 6 refills | Status: DC

## 2016-12-04 NOTE — Patient Instructions (Addendum)
Medicare Annual Wellness Visit  [facility] and the medical providers at [facility] strive to bring you the best medical care.  In doing so we not only want to address your current medical conditions and concerns but also to detect new conditions early and prevent illness, disease and health-related problems.    Medicare offers a yearly Wellness Visit which allows our clinical staff to assess your need for preventative services including immunizations, lifestyle education, counseling to decrease risk of preventable diseases and screening for fall risk and other medical concerns.    This visit is provided free of charge (no copay) for all Medicare recipients. The clinical pharmacists at [facility] have begun to conduct these Wellness Visits which will also include a thorough review of all your medications.    As you primary medical provider recommend that you make an appointment for your Annual Wellness Visit if you have not done so already this year.  You may set up this appointment before you leave today or you may call back [phone] and schedule an appointment.  Please make sure when you call that you mention that you are scheduling your Annual Wellness Visit with the clinical pharmacist so that the appointment may be made for the proper length of time.     Continue current medications. Continue good therapeutic lifestyle changes which include good diet and exercise. Fall precautions discussed with patient. If an FOBT was given today- please return it to our front desk. If you are over 66 years old - you may need Prevnar 17 or the adult Pneumonia vaccine.  **Flu shots are available--- please call and schedule a FLU-CLINIC appointment**  After your visit with [place] today you will receive a survey in the mail or online from [employer] regarding your care with [place]. Please take a moment to fill this out. Your feedback is very  important to [place] as you can help [place] better understand your patient needs as well as improve your experience and satisfaction. WE CARE ABOUT YOU!!!   We will ask you take a probiotic once daily like align We would ask that you continue to drink plenty of fluids and continue to use Mucinex regularly twice daily with a large glass of water Stay well hydrated do not use any overhead fans Use a cool mist humidifier We will plan to get the Pneumovax vaccine at the next visit.

## 2016-12-04 NOTE — Progress Notes (Signed)
Subjective:    Patient ID: Shelly Bennett, female    DOB: 12/03/1951, 65 y.o.   MRN: [id number]  HPI   Patient is here today for follow up of chronic medical probems including hyperlipidemia, hypertension and gastroesophageal reflux disease.  Patient is also concerned about abdominal pain and distention, excessive gas, and bowel habit changes; cough and chest congestion; and a scaly spot on her right cheek.The patient has had one round of antibiotics for the cough and congestion and this may have helped her feel better. She is still coughing and congested. We will make sure that she has refills on her Brio and albuterol inhalers. She is also has some abdominal pain and bloating and gas and irritable bowel type symptoms. She has had a change in bowel habits.There is a positive family history of colon cancer. She is also been eating a lot of nuts. She saw the gastroenterologist about 3 years ago and had a colonoscopy and will not get another one for 2 years. We discussed her trying some probiotics like align and she is going to do that and we will probably see her back again in 2-3 weeks as to follow-up on the problem she is complaining with today. Is up-to-date with her pelvic exams and does have uterine prolapse.  Patient Active Problem List   Diagnosis Date Noted  . Allergic drug reaction 06/25/2015  . Dehydration with hyponatremia 06/25/2015  . HCAP (healthcare-associated pneumonia) 06/25/2015  . Vitamin D deficiency 08/19/2014  . Family history of colon cancer-father at 36+ grandparent w/ rectal cancer 01/14/2014  . Osteopenia 08/12/2013  . CAD (coronary artery disease), native coronary artery 03/22/2013  . Aortic valve stenosis, mild 03/22/2013  . Hyperlipemia 03/11/2013  . Hypertension 03/11/2013  . Allergic rhinitis 03/11/2013  . Left shoulder pain 03/11/2013  . IBS (irritable bowel syndrome) - with chronic recurrent abdominal pain 02/01/2012  . Personal history of colonic polyps -  adenoma 02/01/2012  . Coccydynia 02/01/2012   Outpatient Encounter Prescriptions as of 12/04/2016  Medication Sig  . albuterol (PROVENTIL HFA;VENTOLIN HFA) 108 (90 BASE) MCG/ACT inhaler Inhale 2 puffs into the lungs every 6 (six) hours as needed for wheezing or shortness of breath.  Marland Kitchen amLODipine (NORVASC) 10 MG tablet TAKE 1 TABLET ONCE A DAY  . aspirin 81 MG tablet Take 81 mg by mouth daily.   . calcium carbonate 200 MG capsule Take 250 mg by mouth 2 (two) times daily.   . Cholecalciferol (VITAMIN D-3) 5000 UNITS TABS Take 1 capsule by mouth as directed. Take one capsule by mouth daily Mon thru Friday  . desonide (DESOWEN) 0.05 % cream Apply 1 application topically as needed (face irritation).   . fexofenadine (ALLEGRA) 180 MG tablet Take 180 mg by mouth daily.  . fluticasone (FLONASE) 50 MCG/ACT nasal spray Place 2 sprays into both nostrils daily.  . hydrochlorothiazide (HYDRODIURIL) 25 MG tablet TAKE 1 TABLET ONCE A DAY  . HYDROcodone-acetaminophen (NORCO/VICODIN) 5-325 MG per tablet Take 0.5 tablets by mouth at bedtime as needed for moderate pain.   . mometasone (NASONEX) 50 MCG/ACT nasal spray 2 SPRAYS IN EACH NOSTRIL ONCE A DAY  . omega-3 acid ethyl esters (LOVAZA) 1 g capsule TAKE (1) CAPSULE FOUR TIMES DAILY.  Marland Kitchen ondansetron (ZOFRAN) 4 MG tablet Take 4 mg by mouth every 8 (eight) hours as needed for nausea or vomiting.   . ranitidine (ZANTAC) 150 MG tablet Take 150 mg by mouth at bedtime.  . rosuvastatin (CRESTOR) 40 MG tablet Take  1 tablet (40 mg total) by mouth daily.  . valACYclovir (VALTREX) 1000 MG tablet Take 1 tablet (1,000 mg total) by mouth daily.  . [DISCONTINUED] doxycycline (VIBRA-TABS) 100 MG tablet Take 1 tablet (100 mg total) by mouth 2 (two) times daily.   No facility-administered encounter medications on file as of 12/04/2016.         Review of Systems  Constitutional: Negative.   HENT: Positive for congestion (chest).   Eyes: Negative.   Respiratory: Positive  for cough.   Cardiovascular: Negative.   Gastrointestinal: Positive for abdominal distention and abdominal pain (change in bowel habits, gas).  Endocrine: Negative.   Genitourinary: Negative.   Musculoskeletal: Negative.   Skin: Positive for rash (scaly area on right cheek).  Allergic/Immunologic: Negative.   Neurological: Negative.   Hematological: Negative.   Psychiatric/Behavioral: Negative.           Objective:   Physical Exam  Constitutional: She is oriented to person, place, and time. She appears well-developed and well-nourished. No distress.  The patient is pleasant and alert and somewhat worried about her health condition.  HENT:  Head: Normocephalic and atraumatic.  Right Ear: External ear normal.  Left Ear: External ear normal.  Mouth/Throat: Oropharynx is clear and moist.  Nasal congestion  Eyes: Conjunctivae and EOM are normal. Pupils are equal, round, and reactive to light. Right eye exhibits no discharge. Left eye exhibits no discharge. No scleral icterus.  Neck: Normal range of motion. Neck supple. No thyromegaly present.  No bruits thyromegaly or anterior cervical adenopathy  Cardiovascular: Normal rate, regular rhythm, normal heart sounds and intact distal pulses.   No murmur heard. The heart has a regular rate and rhythm at 60/m  Pulmonary/Chest: Effort normal and breath sounds normal. No respiratory distress. She has no wheezes. She has no rales.  The patient has a dry cough. There are no wheezes or rales.  Abdominal: Soft. Bowel sounds are normal. She exhibits distension. She exhibits no mass. There is tenderness. There is no rebound and no guarding.  Upper abdominal tenderness both right and left upper quadrants the left upper quadrant greater. There is some gas and distention. There is no inguinal adenopathy. No masses were palpable and there were no bruits.  Musculoskeletal: Normal range of motion. She exhibits no edema.  Lymphadenopathy:    She has no  cervical adenopathy.  Neurological: She is alert and oriented to person, place, and time. She has normal reflexes. No cranial nerve deficit.  Skin: Skin is warm and dry. No rash noted.  Psychiatric: She has a normal mood and affect. Her behavior is normal. Judgment and thought content normal.  Nursing note and vitals reviewed.   BP 139/69   Pulse 60   Temp 97.9 F (36.6 C) (Oral)   Ht 5' 8"  (1.727 m)   Wt 186 lb 8 oz (84.6 kg)   BMI 28.36 kg/m        Assessment & Plan:  1. Hypertension -The blood pressure good today and she will continue with current treatment - BMP8+EGFR; Future - CBC with Differential/Platelet; Future - Hepatic function panel; Future - DG Chest 2 View; Future - BMP8+EGFR - CBC with Differential/Platelet - Hepatic function panel  2. Pure hypercholesterolemia -Continue with current treatment pending results of lab work - CBC with Differential/Platelet; Future - NMR, lipoprofile; Future - CBC with Differential/Platelet - NMR, lipoprofile  3. Vitamin D deficiency -Continue with current treatment pending results of lab work - CBC with Differential/Platelet; Future - VITAMIN  D 25 Hydroxy (Vit-D Deficiency, Fractures); Future - CBC with Differential/Platelet - VITAMIN D 25 Hydroxy (Vit-D Deficiency, Fractures)  4. Gastroesophageal reflux disease, esophagitis presence not specified -Continue with current treatment. She does not complain of any reflux today just some upper abdominal pain and loading and gas. - CBC with Differential/Platelet; Future - Hepatic function panel; Future - CBC with Differential/Platelet - Hepatic function panel  5. Arteriosclerotic cardiovascular disease (ASCVD) -Continue with aggressive therapeutic lifestyle changes and follow-up with cardiology as planned - CBC with Differential/Platelet; Future - NMR, lipoprofile; Future - DG Chest 2 View; Future - CBC with Differential/Platelet - NMR, lipoprofile  6. Chest  congestion -Restart Brio inhaler 1 puff daily and rinse mouth after using. Use albuterol inhaler as a rescue inhaler. Continue with Mucinex one twice daily with a large glass of water and cool mist humidification in the house. - CBC with Differential/Platelet; Future - DG Chest 2 View; Future - CBC with Differential/Platelet  7. Dysuria - CBC with Differential/Platelet; Future - Urinalysis, Complete; Future - Urine culture; Future - CBC with Differential/Platelet - Urinalysis, Complete - Urine culture  8. Bloating - CT Abdomen Pelvis W Contrast; Future  9. Generalized abdominal pain - CT Abdomen Pelvis W Contrast; Future  10. Abdominal distension -Start probiotic 1 daily - CT Abdomen Pelvis W Contrast; Future  11. Change in bowel habits - CT Abdomen Pelvis W Contrast; Future  12. Family history of colon cancer - CT Abdomen Pelvis W Contrast; Future  Patient Instructions                       Medicare Annual Wellness Visit  [facility] and the medical providers at [facility] strive to bring you the best medical care.  In doing so we not only want to address your current medical conditions and concerns but also to detect new conditions early and prevent illness, disease and health-related problems.    Medicare offers a yearly Wellness Visit which allows our clinical staff to assess your need for preventative services including immunizations, lifestyle education, counseling to decrease risk of preventable diseases and screening for fall risk and other medical concerns.    This visit is provided free of charge (no copay) for all Medicare recipients. The clinical pharmacists at [facility] have begun to conduct these Wellness Visits which will also include a thorough review of all your medications.    As you primary medical provider recommend that you make an appointment for your Annual Wellness Visit if you have not done so already  this year.  You may set up this appointment before you leave today or you may call back ([phone]) and schedule an appointment.  Please make sure when you call that you mention that you are scheduling your Annual Wellness Visit with the clinical pharmacist so that the appointment may be made for the proper length of time.     Continue current medications. Continue good therapeutic lifestyle changes which include good diet and exercise. Fall precautions discussed with patient. If an FOBT was given today- please return it to our front desk. If you are over 4 years old - you may need Prevnar 31 or the adult Pneumonia vaccine.  **Flu shots are available--- please call and schedule a FLU-CLINIC appointment**  After your visit with [place] today you will receive a survey in the mail or online from [employer] regarding your care with [place]. Please take a moment to fill this out.  Your feedback is very important to [place] as you can help [place] better understand your patient needs as well as improve your experience and satisfaction. WE CARE ABOUT YOU!!!   We will ask you take a probiotic once daily like align We would ask that you continue to drink plenty of fluids and continue to use Mucinex regularly twice daily with a large glass of water Stay well hydrated do not use any overhead fans Use a cool mist humidifier     Arrie Senate MD

## 2016-12-05 ENCOUNTER — Encounter (HOSPITAL_BASED_OUTPATIENT_CLINIC_OR_DEPARTMENT_OTHER): Payer: Self-pay

## 2016-12-05 ENCOUNTER — Ambulatory Visit (HOSPITAL_BASED_OUTPATIENT_CLINIC_OR_DEPARTMENT_OTHER)
Admission: RE | Admit: 2016-12-05 | Discharge: 2016-12-05 | Disposition: A | Discharge status: Home / Self Care | Payer: Medicare Other | Source: Ambulatory Visit | Attending: Family Medicine | Admitting: Family Medicine

## 2016-12-05 DIAGNOSIS — Z800 Family history of malignant neoplasm of digestive organs: Secondary | ICD-10-CM

## 2016-12-05 DIAGNOSIS — K5730 Diverticulosis of large intestine without perforation or abscess without bleeding: Secondary | ICD-10-CM | POA: Insufficient documentation

## 2016-12-05 DIAGNOSIS — R140 Abdominal distension (gaseous): Secondary | ICD-10-CM

## 2016-12-05 DIAGNOSIS — I700 Atherosclerosis of aorta: Secondary | ICD-10-CM | POA: Diagnosis not present

## 2016-12-05 DIAGNOSIS — R1084 Generalized abdominal pain: Secondary | ICD-10-CM | POA: Diagnosis present

## 2016-12-05 DIAGNOSIS — R194 Change in bowel habit: Secondary | ICD-10-CM

## 2016-12-05 LAB — CBC WITH DIFFERENTIAL/PLATELET
BASOS ABS: 0 10*3/uL (ref 0.0–0.2)
Basos: 1 %
EOS (ABSOLUTE): 0.2 10*3/uL (ref 0.0–0.4)
Eos: 3 %
Hematocrit: 40.3 % (ref 34.0–46.6)
Hemoglobin: 13.5 g/dL (ref 11.1–15.9)
IMMATURE GRANS (ABS): 0 10*3/uL (ref 0.0–0.1)
Immature Granulocytes: 0 %
LYMPHS: 31 %
Lymphocytes Absolute: 1.8 10*3/uL (ref 0.7–3.1)
MCH: 31.6 pg (ref 26.6–33.0)
MCHC: 33.5 g/dL (ref 31.5–35.7)
MCV: 94 fL (ref 79–97)
MONOS ABS: 0.4 10*3/uL (ref 0.1–0.9)
Monocytes: 7 %
NEUTROS ABS: 3.3 10*3/uL (ref 1.4–7.0)
NEUTROS PCT: 58 %
PLATELETS: 277 10*3/uL (ref 150–379)
RBC: 4.27 x10E6/uL (ref 3.77–5.28)
RDW: 14.1 % (ref 12.3–15.4)
WBC: 5.7 10*3/uL (ref 3.4–10.8)

## 2016-12-05 LAB — BMP8+EGFR
BUN/Creatinine Ratio: 16 (ref 12–28)
BUN: 11 mg/dL (ref 8–27)
CALCIUM: 10.1 mg/dL (ref 8.7–10.3)
CHLORIDE: 102 mmol/L (ref 96–106)
CO2: 27 mmol/L (ref 18–29)
Creatinine, Ser: 0.68 mg/dL (ref 0.57–1.00)
GFR calc non Af Amer: 91 mL/min/{1.73_m2} (ref >59)
GFR, EST AFRICAN AMERICAN: 105 mL/min/{1.73_m2} (ref >59)
Glucose: 75 mg/dL (ref 65–99)
POTASSIUM: 4.3 mmol/L (ref 3.5–5.2)
SODIUM: 145 mmol/L — AB (ref 134–144)

## 2016-12-05 LAB — NMR, LIPOPROFILE
Cholesterol: 204 mg/dL — ABNORMAL HIGH (ref 100–199)
HDL CHOLESTEROL BY NMR: 76 mg/dL (ref >39)
HDL Particle Number: 44.4 umol/L (ref >=30.5)
LDL PARTICLE NUMBER: 1248 nmol/L — AB (ref <1000)
LDL Size: 21.3 nm (ref >20.5)
LDL-C: 109 mg/dL — AB (ref 0–99)
LP-IR Score: <25 (ref <=45)
SMALL LDL PARTICLE NUMBER: 357 nmol/L (ref <=527)
TRIGLYCERIDES BY NMR: 95 mg/dL (ref 0–149)

## 2016-12-05 LAB — HEPATIC FUNCTION PANEL
ALT: 15 IU/L (ref 0–32)
AST: 19 IU/L (ref 0–40)
Albumin: 4.7 g/dL (ref 3.6–4.8)
Alkaline Phosphatase: 88 IU/L (ref 39–117)
BILIRUBIN, DIRECT: 0.13 mg/dL (ref 0.00–0.40)
Bilirubin Total: 0.5 mg/dL (ref 0.0–1.2)
TOTAL PROTEIN: 7 g/dL (ref 6.0–8.5)

## 2016-12-05 LAB — VITAMIN D 25 HYDROXY (VIT D DEFICIENCY, FRACTURES): VIT D 25 HYDROXY: 52.5 ng/mL (ref 30.0–100.0)

## 2016-12-05 MED ORDER — IOPAMIDOL (ISOVUE-300) INJECTION 61%
100.0000 mL | Freq: Once | INTRAVENOUS | Status: AC | PRN
  Administered 2016-12-05: 100 mL via INTRAVENOUS

## 2016-12-06 ENCOUNTER — Telehealth: Payer: Self-pay | Admitting: Family Medicine

## 2016-12-06 ENCOUNTER — Other Ambulatory Visit: Payer: Self-pay | Admitting: *Deleted

## 2016-12-06 LAB — URINE CULTURE

## 2016-12-06 MED ORDER — CEPHALEXIN 500 MG PO CAPS: 500.0000 mg | ORAL_CAPSULE | Freq: Three times a day (TID) | ORAL | 0 refills | Status: DC

## 2016-12-06 NOTE — Telephone Encounter (Signed)
Patient aware that we are waiting for report.

## 2016-12-06 NOTE — Telephone Encounter (Signed)
Pt aware.

## 2016-12-06 NOTE — Progress Notes (Unsigned)
de

## 2016-12-26 ENCOUNTER — Encounter: Payer: Self-pay | Admitting: Family Medicine

## 2016-12-26 ENCOUNTER — Ambulatory Visit (INDEPENDENT_AMBULATORY_CARE_PROVIDER_SITE_OTHER): Payer: Medicare Other | Admitting: Family Medicine

## 2016-12-26 VITALS — BP 117/73 | HR 60 | Temp 97.4°F | Ht 68.0 in | Wt 187.0 lb

## 2016-12-26 DIAGNOSIS — Z87440 Personal history of urinary (tract) infections: Secondary | ICD-10-CM

## 2016-12-26 DIAGNOSIS — I2510 Atherosclerotic heart disease of native coronary artery without angina pectoris: Secondary | ICD-10-CM

## 2016-12-26 DIAGNOSIS — R1084 Generalized abdominal pain: Secondary | ICD-10-CM

## 2016-12-26 DIAGNOSIS — I10 Essential (primary) hypertension: Secondary | ICD-10-CM | POA: Diagnosis not present

## 2016-12-26 DIAGNOSIS — F439 Reaction to severe stress, unspecified: Secondary | ICD-10-CM

## 2016-12-26 DIAGNOSIS — E7800 Pure hypercholesterolemia, unspecified: Secondary | ICD-10-CM | POA: Diagnosis not present

## 2016-12-26 LAB — URINALYSIS, COMPLETE
BILIRUBIN UA: NEGATIVE
GLUCOSE, UA: NEGATIVE
KETONES UA: NEGATIVE
Nitrite, UA: NEGATIVE
PROTEIN UA: NEGATIVE
SPEC GRAV UA: 1.015 (ref 1.005–1.030)
Urobilinogen, Ur: 0.2 mg/dL (ref 0.2–1.0)
pH, UA: 8 — ABNORMAL HIGH (ref 5.0–7.5)

## 2016-12-26 LAB — MICROSCOPIC EXAMINATION
BACTERIA UA: NONE SEEN
Renal Epithel, UA: NONE SEEN /hpf

## 2016-12-26 NOTE — Progress Notes (Signed)
Subjective:    Patient ID: Shelly Bennett, female    DOB: [birth year], 66 y.o.   MRN: [id number]  HPI Patient here today for 3 week follow up on congestion, bloating and recent UTI.The patient did have a CT scan of the abdomen because of abdominal pain and distention. The results of this will be reviewed with her during the visit today she'll be given a copy of the report. The chest x-ray showed no active disease. CT of the abdomen showed no bowel obstruction or acute bowel inflammation other than some mild distal diverticulosis but no evidence of diverticulitis. He did reveal aortic atherosclerosis and some asymmetric thickening and calcification of the origin of the left common hamstring tendon. The pancreas was normal and the spleen was normal. There is no sign of any gallstones. The patient denies any chest pain pressure or palpitations. She denies any shortness of breath. She's not having any problems with her intestinal tract including nausea vomiting diarrhea blood in the stool or black tarry bowel movements. She is passing her water better and will leave a urine specimen today for review. She does discuss a lot of stress in her life with looking after her mother and stress in the family itself some of the stresses are better but some are still there and getting worse.    Patient Active Problem List   Diagnosis Date Noted  . Aortic atherosclerosis ([place]) 12/04/2016  . Allergic drug reaction 06/25/2015  . Dehydration with hyponatremia 06/25/2015  . HCAP (healthcare-associated pneumonia) 06/25/2015  . Vitamin D deficiency 08/19/2014  . Family history of colon cancer-father at 24+ grandparent w/ rectal cancer 01/14/2014  . Osteopenia 08/12/2013  . CAD (coronary artery disease), native coronary artery 03/22/2013  . Aortic valve stenosis, mild 03/22/2013  . Hyperlipemia 03/11/2013  . Hypertension 03/11/2013  . Allergic rhinitis 03/11/2013  . Left shoulder pain 03/11/2013  . IBS (irritable  bowel syndrome) - with chronic recurrent abdominal pain 02/01/2012  . Personal history of colonic polyps - adenoma 02/01/2012  . Coccydynia 02/01/2012   Outpatient Encounter Prescriptions as of 12/26/2016  Medication Sig  . albuterol (PROVENTIL HFA;VENTOLIN HFA) 108 (90 Base) MCG/ACT inhaler Inhale 2 puffs into the lungs every 6 (six) hours as needed for wheezing or shortness of breath.  Marland Kitchen amLODipine (NORVASC) 10 MG tablet TAKE 1 TABLET ONCE A DAY  . aspirin 81 MG tablet Take 81 mg by mouth daily.   . calcium carbonate 200 MG capsule Take 250 mg by mouth 2 (two) times daily.   . Cholecalciferol (VITAMIN D-3) 5000 UNITS TABS Take 1 capsule by mouth as directed. Take one capsule by mouth daily Mon thru Friday  . desonide (DESOWEN) 0.05 % cream Apply 1 application topically as needed (face irritation).   . fexofenadine (ALLEGRA) 180 MG tablet Take 180 mg by mouth daily.  . fluticasone (FLONASE) 50 MCG/ACT nasal spray Place 2 sprays into both nostrils daily.  . fluticasone furoate-vilanterol (BREO ELLIPTA) 100-25 MCG/INH AEPB Inhale 1 puff into the lungs daily.  . hydrochlorothiazide (HYDRODIURIL) 25 MG tablet TAKE 1 TABLET ONCE A DAY  . HYDROcodone-acetaminophen (NORCO/VICODIN) 5-325 MG per tablet Take 0.5 tablets by mouth at bedtime as needed for moderate pain.   . mometasone (NASONEX) 50 MCG/ACT nasal spray 2 SPRAYS IN EACH NOSTRIL ONCE A DAY  . omega-3 acid ethyl esters (LOVAZA) 1 g capsule TAKE (1) CAPSULE FOUR TIMES DAILY.  Marland Kitchen ondansetron (ZOFRAN) 4 MG tablet Take 4 mg by mouth every 8 (eight) hours  as needed for nausea or vomiting.   . ranitidine (ZANTAC) 150 MG tablet Take 150 mg by mouth at bedtime.  . rosuvastatin (CRESTOR) 40 MG tablet Take 1 tablet (40 mg total) by mouth daily.  . valACYclovir (VALTREX) 1000 MG tablet Take 1 tablet (1,000 mg total) by mouth daily.  . [DISCONTINUED] cephALEXin (KEFLEX) 500 MG capsule Take 1 capsule (500 mg total) by mouth 3 (three) times daily.   No  facility-administered encounter medications on file as of 12/26/2016.       Review of Systems  Constitutional: Negative.   HENT: Positive for postnasal drip.   Eyes: Negative.   Respiratory: Negative.   Cardiovascular: Negative.   Gastrointestinal: Negative.   Endocrine: Negative.   Genitourinary: Positive for frequency.       Abnormal look of urine  Musculoskeletal: Negative.   Skin: Negative.   Allergic/Immunologic: Negative.   Neurological: Negative.   Hematological: Negative.   Psychiatric/Behavioral: Negative.        Objective:   Physical Exam  Constitutional: She is oriented to person, place, and time. She appears well-developed and well-nourished.  HENT:  Head: Normocephalic and atraumatic.  Right Ear: External ear normal.  Left Ear: External ear normal.  Mouth/Throat: Oropharynx is clear and moist. No oropharyngeal exudate.  Nasal congestion and erythema  Eyes: Conjunctivae and EOM are normal. Pupils are equal, round, and reactive to light. Right eye exhibits no discharge. Left eye exhibits no discharge. No scleral icterus.  Neck: Normal range of motion. Neck supple. No thyromegaly present.  No adenopathy  Cardiovascular: Normal rate, regular rhythm, normal heart sounds and intact distal pulses.   No murmur heard. Heart is regular at 60/m  Pulmonary/Chest: Effort normal and breath sounds normal. No respiratory distress. She has no wheezes. She has no rales. She exhibits no tenderness.  Dry cough  Abdominal: Soft. Bowel sounds are normal. She exhibits no mass. There is no tenderness. There is no rebound and no guarding.  Musculoskeletal: Normal range of motion. She exhibits no edema.  Lymphadenopathy:    She has no cervical adenopathy.  Neurological: She is alert and oriented to person, place, and time. She has normal reflexes. No cranial nerve deficit.  Skin: Skin is warm and dry. No rash noted.  Psychiatric: She has a normal mood and affect. Her behavior is  normal. Judgment and thought content normal.  Nursing note and vitals reviewed.  BP 117/73 (BP Location: Left Arm)   Pulse 60   Temp 97.4 F (36.3 C) (Oral)   Ht 5\' 8"  (1.727 m)   Wt 187 lb (84.8 kg)   BMI 28.43 kg/m         Assessment & Plan:  1. Recent urinary tract infection -Continue drinking plenty of fluids and stay well hydrated - Urinalysis, Complete - Urine culture  2. Generalized abdominal pain -CT scan results were reviewed with patient and she feels reassured about this. Her next colonoscopy is not due until 2020.  3. Situational stress -The patient is in a stressful situation with taking care of her elderly mother and dealing with issues revolving around this. She should continue to look up and pray that things will work out and in time they will.  4. Hypertension -The blood pressure is good today and she will continue with current treatment  5. Pure hypercholesterolemia -Continue with current treatment and aggressive therapeutic lifestyle changes  6. Arteriosclerotic cardiovascular disease (ASCVD) -Continue follow-up with cardiology  Patient Instructions  Continue to follow-up with cardiology Continue  with inhaler Continue with humidifiers Continue with nasal saline Continue with Mucinex regularly  Arrie Senate MD

## 2016-12-26 NOTE — Patient Instructions (Signed)
Continue to follow-up with cardiology Continue with inhaler Continue with humidifiers Continue with nasal saline Continue with Mucinex regularly

## 2016-12-27 LAB — URINE CULTURE

## 2017-01-21 ENCOUNTER — Telehealth: Payer: Self-pay | Admitting: Family Medicine

## 2017-01-22 MED ORDER — OSELTAMIVIR PHOSPHATE 75 MG PO CAPS: 75.0000 mg | ORAL_CAPSULE | Freq: Every day | ORAL | 0 refills | Status: DC

## 2017-01-22 NOTE — Telephone Encounter (Signed)
Please call in Tamiflu 75 mg 1 daily 10

## 2017-01-22 NOTE — Telephone Encounter (Signed)
LMOVM that Rx was sent to pharmacy 

## 2017-02-01 ENCOUNTER — Ambulatory Visit (INDEPENDENT_AMBULATORY_CARE_PROVIDER_SITE_OTHER): Payer: Medicare Other | Admitting: Family Medicine

## 2017-02-01 ENCOUNTER — Encounter: Payer: Self-pay | Admitting: Family Medicine

## 2017-02-01 VITALS — BP 129/71 | HR 72 | Temp 100.1°F | Ht 68.0 in | Wt 187.4 lb

## 2017-02-01 DIAGNOSIS — J029 Acute pharyngitis, unspecified: Secondary | ICD-10-CM

## 2017-02-01 DIAGNOSIS — H6692 Otitis media, unspecified, left ear: Secondary | ICD-10-CM | POA: Diagnosis not present

## 2017-02-01 HISTORY — DX: Otitis media, unspecified, left ear: H66.92

## 2017-02-01 LAB — RAPID STREP SCREEN (MED CTR MEBANE ONLY): STREP GP A AG, IA W/REFLEX: NEGATIVE

## 2017-02-01 LAB — CULTURE, GROUP A STREP

## 2017-02-01 MED ORDER — AZITHROMYCIN 250 MG PO TABS: ORAL_TABLET | ORAL | 0 refills | Status: DC

## 2017-02-01 NOTE — Progress Notes (Signed)
Subjective:    Patient ID: Shelly Bennett, female    DOB: 01-24-1951, 66 y.o.   MRN: [id number]  HPI exposure to flu last week. Her 67 year old mother was diagnosed here with flu and started on Tamiflu and patient was started on Tamiflu as a preventive. Her mother also was treated for pneumonia. Patient complains today of sore throat worse on the left side with some discomfort in her left ear as well. There was some subtle concerned about strep infection. She has had some fever.   Patient Active Problem List   Diagnosis Date Noted  . Aortic atherosclerosis ([place]) 12/04/2016  . Allergic drug reaction 06/25/2015  . Dehydration with hyponatremia 06/25/2015  . HCAP (healthcare-associated pneumonia) 06/25/2015  . Vitamin D deficiency 08/19/2014  . Family history of colon cancer-father at 40+ grandparent w/ rectal cancer 01/14/2014  . Osteopenia 08/12/2013  . CAD (coronary artery disease), native coronary artery 03/22/2013  . Aortic valve stenosis, mild 03/22/2013  . Hyperlipemia 03/11/2013  . Hypertension 03/11/2013  . Allergic rhinitis 03/11/2013  . Left shoulder pain 03/11/2013  . IBS (irritable bowel syndrome) - with chronic recurrent abdominal pain 02/01/2012  . Personal history of colonic polyps - adenoma 02/01/2012  . Coccydynia 02/01/2012   Outpatient Encounter Prescriptions as of 02/01/2017  Medication Sig  . albuterol (PROVENTIL HFA;VENTOLIN HFA) 108 (90 Base) MCG/ACT inhaler Inhale 2 puffs into the lungs every 6 (six) hours as needed for wheezing or shortness of breath.  Marland Kitchen amLODipine (NORVASC) 10 MG tablet TAKE 1 TABLET ONCE A DAY  . aspirin 81 MG tablet Take 81 mg by mouth daily.   . calcium carbonate 200 MG capsule Take 250 mg by mouth 2 (two) times daily.   . Cholecalciferol (VITAMIN D-3) 5000 UNITS TABS Take 1 capsule by mouth as directed. Take one capsule by mouth daily Mon thru Friday  . desonide (DESOWEN) 0.05 % cream Apply 1 application topically as needed (face  irritation).   . fexofenadine (ALLEGRA) 180 MG tablet Take 180 mg by mouth daily.  . fluticasone (FLONASE) 50 MCG/ACT nasal spray Place 2 sprays into both nostrils daily.  . fluticasone furoate-vilanterol (BREO ELLIPTA) 100-25 MCG/INH AEPB Inhale 1 puff into the lungs daily.  . hydrochlorothiazide (HYDRODIURIL) 25 MG tablet TAKE 1 TABLET ONCE A DAY  . HYDROcodone-acetaminophen (NORCO/VICODIN) 5-325 MG per tablet Take 0.5 tablets by mouth at bedtime as needed for moderate pain.   . mometasone (NASONEX) 50 MCG/ACT nasal spray 2 SPRAYS IN EACH NOSTRIL ONCE A DAY  . omega-3 acid ethyl esters (LOVAZA) 1 g capsule TAKE (1) CAPSULE FOUR TIMES DAILY.  Marland Kitchen ondansetron (ZOFRAN) 4 MG tablet Take 4 mg by mouth every 8 (eight) hours as needed for nausea or vomiting.   . ranitidine (ZANTAC) 150 MG tablet Take 150 mg by mouth at bedtime.  . rosuvastatin (CRESTOR) 40 MG tablet Take 1 tablet (40 mg total) by mouth daily.  . valACYclovir (VALTREX) 1000 MG tablet Take 1 tablet (1,000 mg total) by mouth daily.  . [DISCONTINUED] oseltamivir (TAMIFLU) 75 MG capsule Take 1 capsule (75 mg total) by mouth daily.   No facility-administered encounter medications on file as of 02/01/2017.       Review of Systems  HENT: Positive for ear pain and sore throat.        Objective:   Physical Exam  Constitutional: She appears well-developed.  HENT:  Head: Normocephalic.  Right Ear: External ear normal.  Mouth/Throat: Oropharynx is clear and moist.  Left tympanic membrane  is dull.  Pulmonary/Chest: Effort normal and breath sounds normal.   BP 129/71   Pulse 72   Temp 100.1 F (37.8 C) (Oral)   Ht 5\' 8"  (1.727 m)   Wt 187 lb 6.4 oz (85 kg)   BMI 28.49 kg/m         Assessment & Plan:  1. Sore throat Rapid strep test is negative. I suspect she has some dysfunction of the eustachian tube as well as a serous effusion in her left ear. With fever and effusion will treat as infection. We have discussed options, I  believe, thoroughly. - Rapid strep screen (not at [facility])  Wardell Honour MD

## 2017-02-04 ENCOUNTER — Encounter: Payer: Self-pay | Admitting: Nurse Practitioner

## 2017-02-04 ENCOUNTER — Ambulatory Visit (INDEPENDENT_AMBULATORY_CARE_PROVIDER_SITE_OTHER): Payer: Medicare Other | Admitting: Nurse Practitioner

## 2017-02-04 ENCOUNTER — Telehealth: Payer: Self-pay | Admitting: Nurse Practitioner

## 2017-02-04 VITALS — BP 127/75 | HR 55 | Temp 97.1°F | Ht 68.0 in | Wt 186.0 lb

## 2017-02-04 DIAGNOSIS — B370 Candidal stomatitis: Secondary | ICD-10-CM | POA: Diagnosis not present

## 2017-02-04 DIAGNOSIS — K120 Recurrent oral aphthae: Secondary | ICD-10-CM

## 2017-02-04 MED ORDER — MAGIC MOUTHWASH: 5.0000 mL | Freq: Four times a day (QID) | ORAL | 0 refills | Status: DC

## 2017-02-04 NOTE — Telephone Encounter (Signed)
Patient notified it was ok for her to swallow. Patient verbalized understanding

## 2017-02-04 NOTE — Telephone Encounter (Signed)
Ok to swallow

## 2017-02-04 NOTE — Progress Notes (Signed)
   Subjective:    Patient ID: Shelly Bennett, female    DOB: 20-Oct-1951, 66 y.o.   MRN: [id number]  HPI Patient saw Dr. Sabra Heck last week with sore throat- strep was negative- He started her on z pak anyway because thought may be getting ear infection. Over the weekend she said her tongue started feeling like it was coated with something and felt thick. SHe started having left jaw painand oral pain on Saturday.    Review of Systems  Constitutional: Negative for chills and fever.  HENT: Positive for facial swelling (left jaw) and mouth sores. Negative for congestion.   Respiratory: Negative.   Cardiovascular: Negative.   Gastrointestinal: Negative.   Neurological: Negative.   Psychiatric/Behavioral: Negative.        Objective:   Physical Exam  Constitutional: She is oriented to person, place, and time. She appears well-developed and well-nourished. No distress.  HENT:  Right Ear: External ear normal.  Left Ear: External ear normal.  Nose: Nose normal.  Mouth/Throat: Oropharynx is clear and moist.  Left buccal erythema ad edema with visiable cankerous lesion left upper posterior buccal area.Sore to touch  Cardiovascular: Normal rate and regular rhythm.   Pulmonary/Chest: Effort normal and breath sounds normal.  Neurological: She is alert and oriented to person, place, and time.  Skin: Skin is warm.  Psychiatric: She has a normal mood and affect. Her behavior is normal. Judgment and thought content normal.    BP 127/75   Pulse (!) 55   Temp 97.1 F (36.2 C) (Oral)   Ht 5\' 8"  (1.727 m)   Wt 186 lb (84.4 kg)   BMI 28.28 kg/m        Assessment & Plan:   1. Thrush   2. Canker sores oral    Meds ordered this encounter  Medications  . magic mouthwash SOLN    Sig: Take 5 mLs by mouth 4 (four) times daily.    Dispense:  200 mL    Refill:  0    Order Specific Question:   Supervising Provider    Answer:   Eustaquio Maize [[id number]]   If not improving will call in cleocin  rx RTO prn  Mary-Binta Hassell Done, FNP

## 2017-02-04 NOTE — Patient Instructions (Signed)
Oral Thrush, Adult Oral thrush is an infection in your mouth and throat. It causes white patches on your tongue and in your mouth. Follow these instructions at home: Helping with soreness  To lessen your pain: ? Drink cold liquids, like water and iced tea. ? Eat frozen ice pops or frozen juices. ? Eat foods that are easy to swallow, like gelatin and ice cream. ? Drink from a straw if the patches in your mouth are painful. General instructions   Take or use over-the-counter and prescription medicines only as told by your doctor. Medicine for oral thrush may be something to swallow, or it may be something to put on the infected area.  Eat plain yogurt that has live cultures in it. Read the label to make sure.  If you wear dentures: ? Take out your dentures before you go to bed. ? Brush them well. ? Soak them in a denture cleaner.  Rinse your mouth with warm salt-water many times a day. To make the salt-water mixture, completely dissolve 1/2-1 teaspoon of salt in 1 cup of warm water. Contact a doctor if:  Your problems are getting worse.  Your problems do not get better in less than 7 days with treatment.  Your infection is spreading. This may show as white patches on the skin outside of your mouth.  You are nursing your baby and you have redness and pain in the nipples. This information is not intended to replace advice given to you by your health care provider. Make sure you discuss any questions you have with your health care provider. Document Released: 02/27/2010 Document Revised: 08/27/2016 Document Reviewed: 08/27/2016 Elsevier Interactive Patient Education  2017 [employer]  

## 2017-03-06 IMAGING — CR DG CHEST 2V
2 series · 2 of 2 positions shown · non-contrast
Comparison: PA and lateral chest of April 18, 2015

CLINICAL DATA: Follow-up of pneumonia

EXAM:
CHEST  2 VIEW

[view not recorded (1 of 2)]
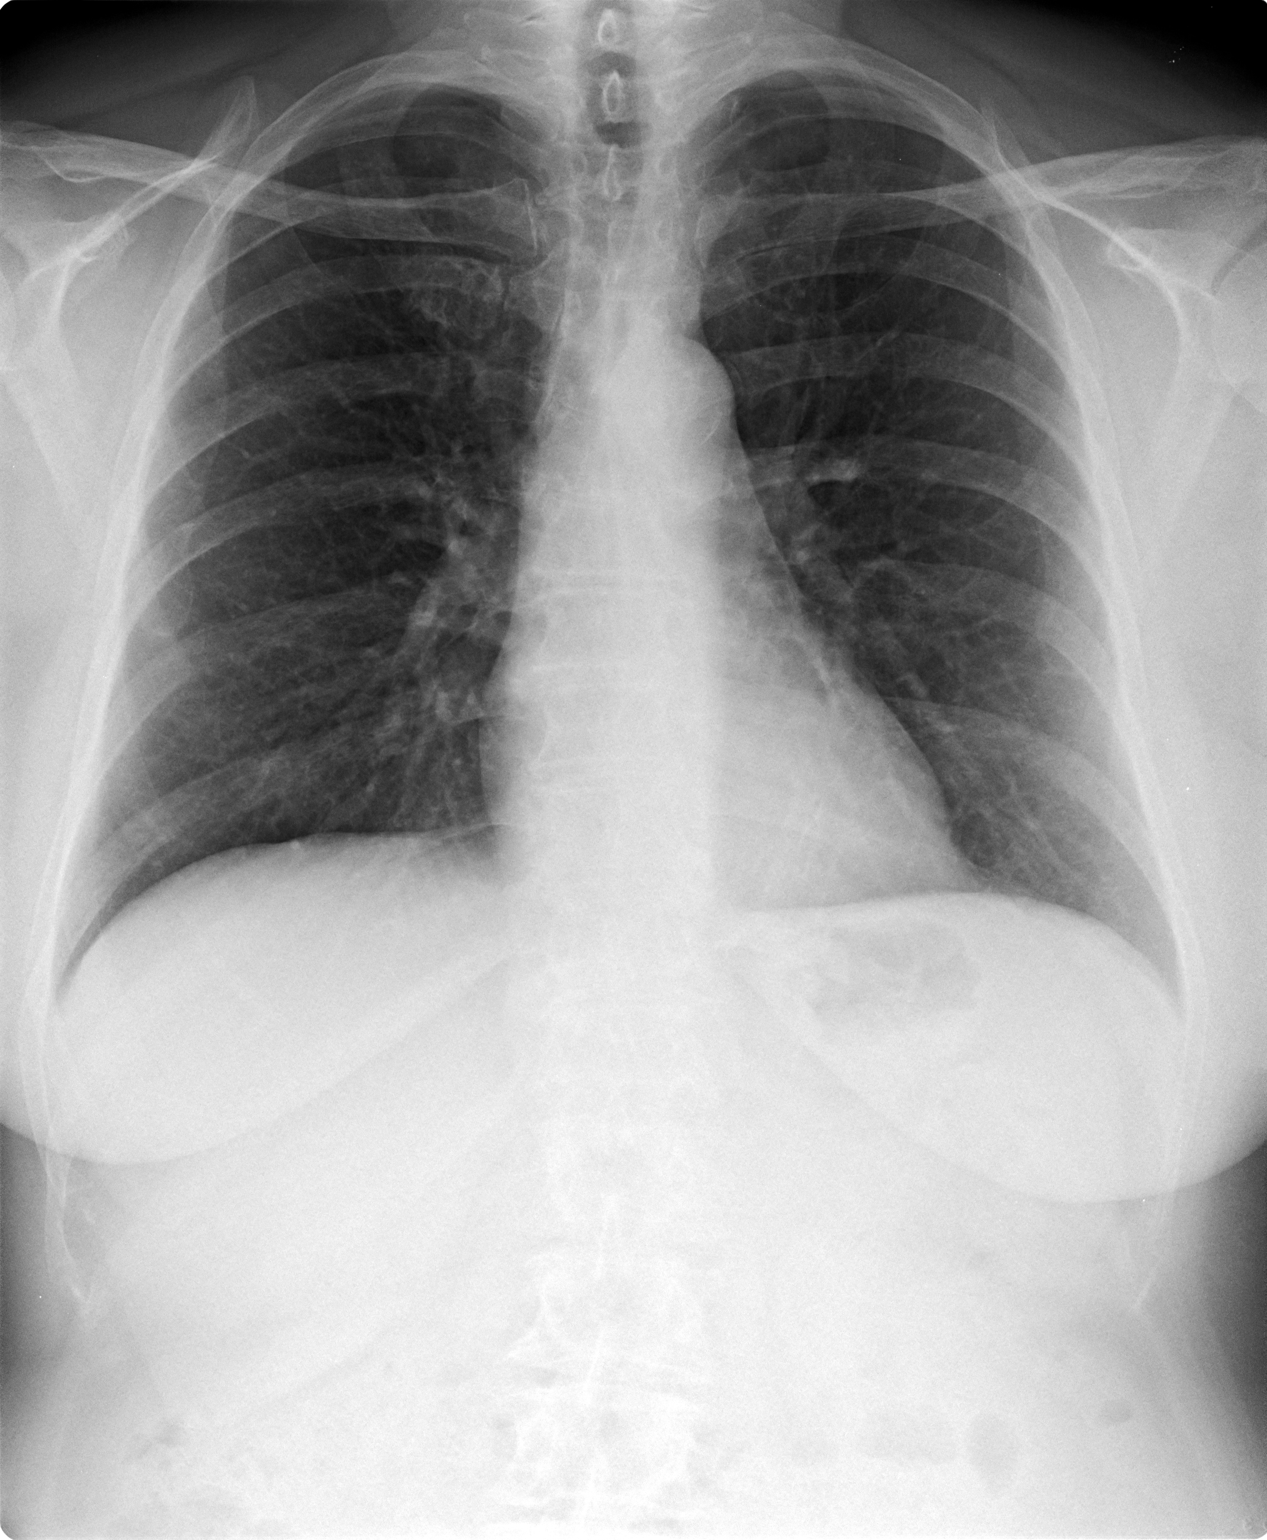

[view not recorded (2 of 2)]
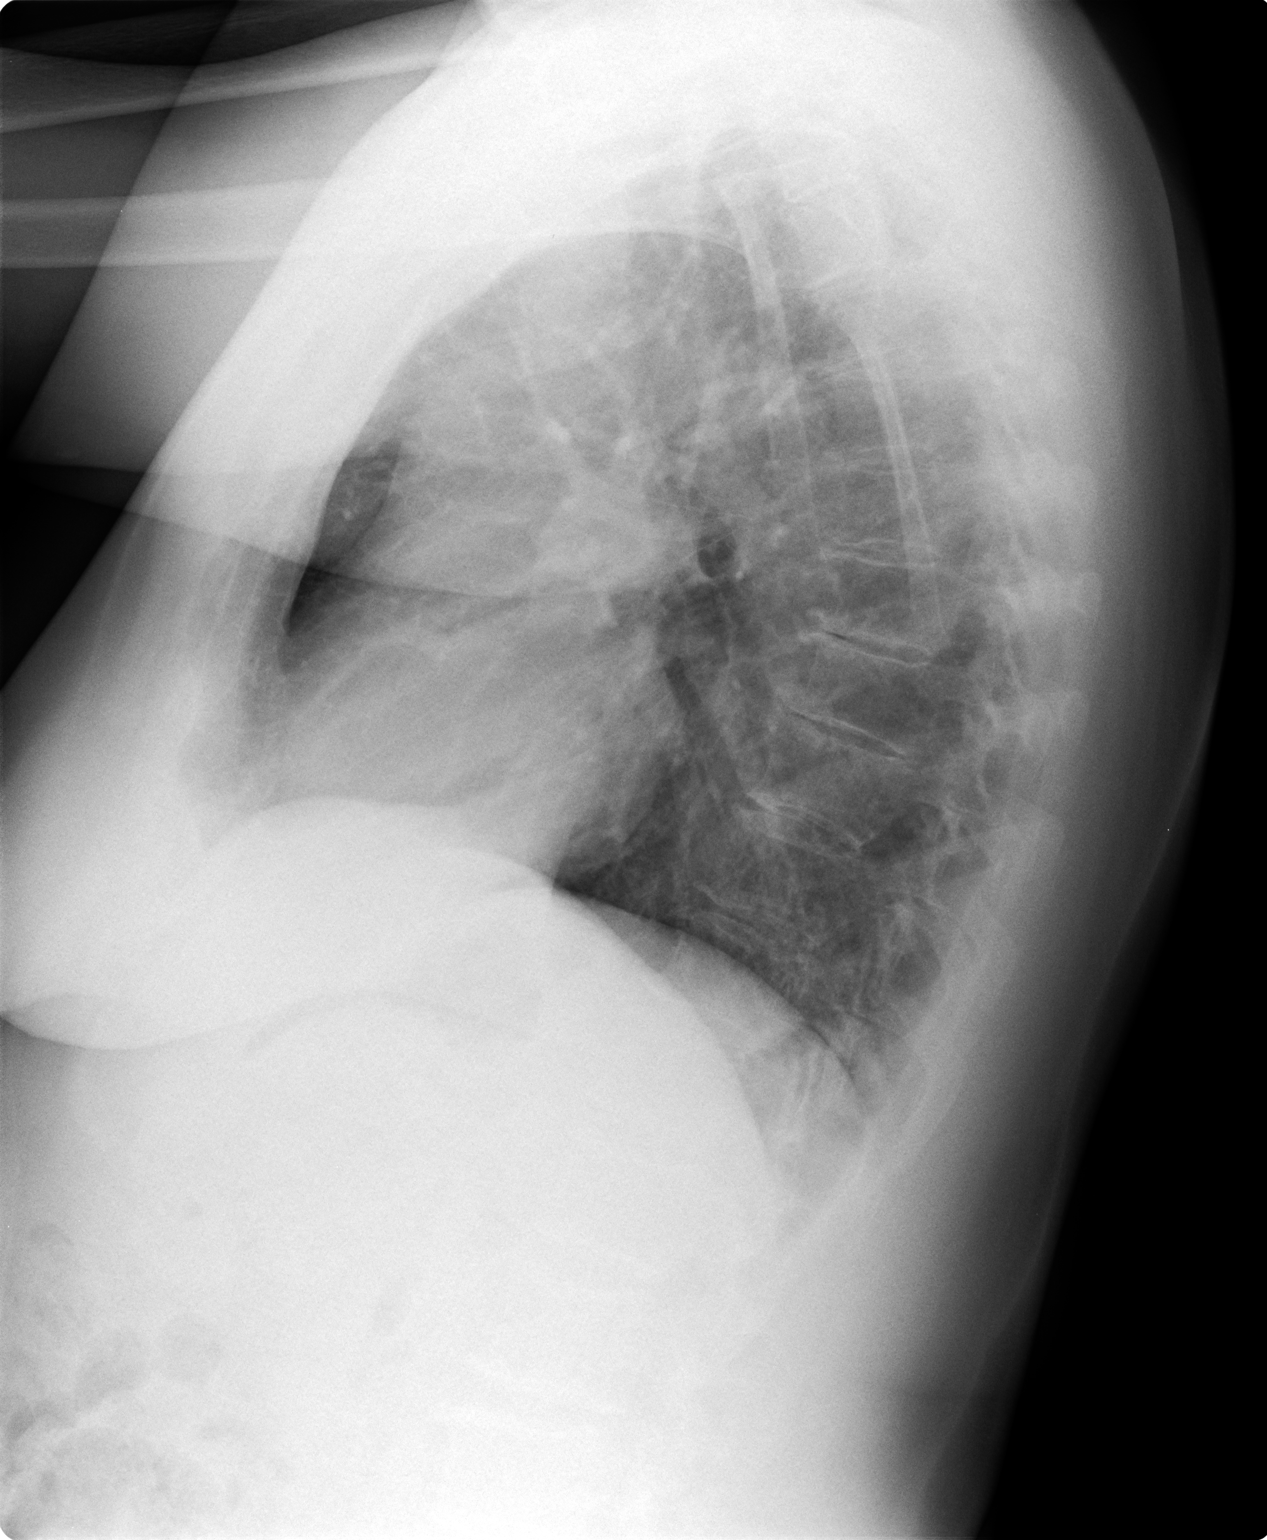

[2 of 2 positions shown; findings below may reference images not displayed]

FINDINGS: The lungs are adequately inflated. The left lower lobe pneumonia has
cleared. The heart and pulmonary vascularity are normal. The
mediastinum is normal in width. The bony thorax exhibits no acute
abnormality.
IMPRESSION: Interval clearing of the left lower lobe pneumonia. There is no
active cardiopulmonary disease.

## 2017-03-12 ENCOUNTER — Other Ambulatory Visit: Payer: Self-pay | Admitting: Family Medicine

## 2017-03-20 ENCOUNTER — Telehealth: Payer: Self-pay

## 2017-05-01 ENCOUNTER — Telehealth (INDEPENDENT_AMBULATORY_CARE_PROVIDER_SITE_OTHER): Payer: Self-pay | Admitting: Physical Medicine and Rehabilitation

## 2017-05-01 ENCOUNTER — Ambulatory Visit (INDEPENDENT_AMBULATORY_CARE_PROVIDER_SITE_OTHER): Payer: Medicare Other | Admitting: Family Medicine

## 2017-05-01 ENCOUNTER — Encounter: Payer: Self-pay | Admitting: Family Medicine

## 2017-05-01 VITALS — BP 126/73 | HR 57 | Temp 97.4°F | Ht 68.0 in | Wt 187.0 lb

## 2017-05-01 DIAGNOSIS — Z23 Encounter for immunization: Secondary | ICD-10-CM

## 2017-05-01 DIAGNOSIS — I10 Essential (primary) hypertension: Secondary | ICD-10-CM

## 2017-05-01 DIAGNOSIS — I700 Atherosclerosis of aorta: Secondary | ICD-10-CM | POA: Diagnosis not present

## 2017-05-01 DIAGNOSIS — E559 Vitamin D deficiency, unspecified: Secondary | ICD-10-CM

## 2017-05-01 DIAGNOSIS — E7800 Pure hypercholesterolemia, unspecified: Secondary | ICD-10-CM

## 2017-05-01 DIAGNOSIS — I2510 Atherosclerotic heart disease of native coronary artery without angina pectoris: Secondary | ICD-10-CM

## 2017-05-01 DIAGNOSIS — M25552 Pain in left hip: Secondary | ICD-10-CM

## 2017-05-01 DIAGNOSIS — Z800 Family history of malignant neoplasm of digestive organs: Secondary | ICD-10-CM | POA: Diagnosis not present

## 2017-05-01 DIAGNOSIS — F439 Reaction to severe stress, unspecified: Secondary | ICD-10-CM

## 2017-05-01 DIAGNOSIS — K219 Gastro-esophageal reflux disease without esophagitis: Secondary | ICD-10-CM | POA: Diagnosis not present

## 2017-05-01 DIAGNOSIS — I350 Nonrheumatic aortic (valve) stenosis: Secondary | ICD-10-CM

## 2017-05-01 NOTE — Telephone Encounter (Signed)
Scheduled for 05/15/17 at 1300 with driver.

## 2017-05-01 NOTE — Progress Notes (Signed)
Subjective:    Patient ID: Shelly Bennett, female    DOB: 18-Apr-1951, 66 y.o.   MRN: [id number]  HPI Pt here for follow up and management of chronic medical problems which includes hyperlipidemia and hypertension. She is taking medication regularly.The patient has had x-rays of her left hip because of pain radiating to the left leg. She also complains of some discomfort with her left thumb. She is up-to-date on her Pap smear and mammogram. She had a DEXA scan in November 2016. She will get lab work today. She is not requesting any refills. She will get an updated tetanus shot today and will get her Pneumovax at the next office visit. The patient is under a lot of stress and dealing with her elderly mother who is 31 years old and basically is total care at home. The chest pain pressure or tightness. She denies any shortness of breath. Currently she's not having any problems with nausea vomiting diarrhea blood in the stool or black tarry bowel movements. She is passing her water without problems. She has this problem with her left hip pain and the CT scan that was done did reveal some tendinopathy in the left hip and she is planning on seeing an orthopedic surgeon for follow-up of this. It is especially bad with sitting. She has an upcoming visit with the cardiologist because of her mitral valve stenosis and that is the summer. She is also seen the dermatologist and the ophthalmologist. She will have regular exams with the dermatologist on a yearly basis.     Patient Active Problem List   Diagnosis Date Noted  . Otitis of left ear 02/01/2017  . Aortic atherosclerosis ([place]) 12/04/2016  . Allergic drug reaction 06/25/2015  . Dehydration with hyponatremia 06/25/2015  . HCAP (healthcare-associated pneumonia) 06/25/2015  . Vitamin D deficiency 08/19/2014  . Family history of colon cancer-father at 79+ grandparent w/ rectal cancer 01/14/2014  . Osteopenia 08/12/2013  . CAD (coronary artery disease),  native coronary artery 03/22/2013  . Aortic valve stenosis, mild 03/22/2013  . Hyperlipemia 03/11/2013  . Hypertension 03/11/2013  . Allergic rhinitis 03/11/2013  . Left shoulder pain 03/11/2013  . IBS (irritable bowel syndrome) - with chronic recurrent abdominal pain 02/01/2012  . Personal history of colonic polyps - adenoma 02/01/2012  . Coccydynia 02/01/2012   Outpatient Encounter Prescriptions as of 05/01/2017  Medication Sig  . albuterol (PROVENTIL HFA;VENTOLIN HFA) 108 (90 Base) MCG/ACT inhaler Inhale 2 puffs into the lungs every 6 (six) hours as needed for wheezing or shortness of breath.  Marland Kitchen amLODipine (NORVASC) 10 MG tablet TAKE 1 TABLET ONCE A DAY  . aspirin 81 MG tablet Take 81 mg by mouth daily.   . calcium carbonate 200 MG capsule Take 250 mg by mouth 2 (two) times daily.   . Cholecalciferol (VITAMIN D-3) 5000 UNITS TABS Take 1 capsule by mouth as directed. Take one capsule by mouth daily Mon thru Friday  . desonide (DESOWEN) 0.05 % cream Apply 1 application topically as needed (face irritation).   . fexofenadine (ALLEGRA) 180 MG tablet Take 180 mg by mouth daily.  . fluticasone (FLONASE) 50 MCG/ACT nasal spray Place 2 sprays into both nostrils daily.  . hydrochlorothiazide (HYDRODIURIL) 25 MG tablet TAKE 1 TABLET ONCE A DAY  . HYDROcodone-acetaminophen (NORCO/VICODIN) 5-325 MG per tablet Take 0.5 tablets by mouth at bedtime as needed for moderate pain.   . mometasone (NASONEX) 50 MCG/ACT nasal spray 2 SPRAYS IN EACH NOSTRIL ONCE A DAY  .  omega-3 acid ethyl esters (LOVAZA) 1 g capsule TAKE (1) CAPSULE FOUR TIMES DAILY.  . ranitidine (ZANTAC) 150 MG tablet Take 150 mg by mouth at bedtime.  . rosuvastatin (CRESTOR) 40 MG tablet TAKE 1 TABLET DAILY  . fluticasone furoate-vilanterol (BREO ELLIPTA) 100-25 MCG/INH AEPB Inhale 1 puff into the lungs daily. (Patient not taking: Reported on 05/01/2017)  . [DISCONTINUED] azithromycin (ZITHROMAX Z-PAK) 250 MG tablet As directed  .  [DISCONTINUED] magic mouthwash SOLN Take 5 mLs by mouth 4 (four) times daily.  . [DISCONTINUED] ondansetron (ZOFRAN) 4 MG tablet Take 4 mg by mouth every 8 (eight) hours as needed for nausea or vomiting.   . [DISCONTINUED] valACYclovir (VALTREX) 1000 MG tablet Take 1 tablet (1,000 mg total) by mouth daily.   No facility-administered encounter medications on file as of 05/01/2017.      Review of Systems  Constitutional: Negative.   HENT: Negative.   Eyes: Negative.   Respiratory: Negative.   Cardiovascular: Negative.   Gastrointestinal: Negative.   Endocrine: Negative.   Genitourinary: Negative.   Musculoskeletal: Positive for arthralgias (left hip pain runs down leg // left thumb pain).  Skin: Negative.   Allergic/Immunologic: Negative.   Neurological: Negative.   Hematological: Negative.   Psychiatric/Behavioral: Negative.        Objective:   Physical Exam  Constitutional: She is oriented to person, place, and time. She appears well-developed and well-nourished. No distress.  The patient is pleasant and alert but under a lot of stress because being a caregiver for her mother.  HENT:  Head: Normocephalic and atraumatic.  Right Ear: External ear normal.  Left Ear: External ear normal.  Nose: Nose normal.  Mouth/Throat: Oropharynx is clear and moist. No oropharyngeal exudate.  Eyes: Conjunctivae and EOM are normal. Pupils are equal, round, and reactive to light. Right eye exhibits no discharge. Left eye exhibits no discharge. No scleral icterus.  The patient sees the ophthalmologist regularly and was told recently that she has an early cataract.  Neck: Normal range of motion. Neck supple. No thyromegaly present.  No bruits thyromegaly or anterior cervical adenopathy  Cardiovascular: Normal rate, regular rhythm, normal heart sounds and intact distal pulses.   No murmur heard. The heart has a regular rate and rhythm at 60/m with a faint grade 1 to 2/6 systolic ejection murmur    Pulmonary/Chest: Effort normal and breath sounds normal. No respiratory distress. She has no wheezes. She has no rales.  Clear anteriorly and posteriorly  Abdominal: Soft. Bowel sounds are normal. She exhibits no mass. There is no tenderness. There is no rebound and no guarding.  No abdominal tenderness masses or bruits inguinal adenopathy or organ enlargement  Musculoskeletal: She exhibits tenderness. She exhibits no edema.  The patient does have some pain in her first metacarpal area with pressure and she will discuss this with her orthopedist and with the possible need for an injection. She will also see him regarding her ongoing left hip pain. She will take a copy of the x-ray report with her when she goes to see him.  Lymphadenopathy:    She has no cervical adenopathy.  Neurological: She is alert and oriented to person, place, and time. She has normal reflexes. No cranial nerve deficit.  Skin: Skin is warm and dry. No rash noted.  Psychiatric: She has a normal mood and affect. Her behavior is normal. Judgment and thought content normal.  Nursing note and vitals reviewed.   BP 126/73 (BP Location: Left Arm)   Pulse Marland Kitchen)  57   Temp 97.4 F (36.3 C) (Oral)   Ht 5' 8"  (1.727 m)   Wt 187 lb (84.8 kg)   BMI 28.43 kg/m        Assessment & Plan:  1. Hypertension The patient will continue with her current medicine and her blood pressure is under good control today. CBC with Differential/Platelet - BMP8+EGFR - Hepatic function panel  2. Pure hypercholesterolemia -Continue with aggressive therapeutic lifestyle changes and current treatment - CBC with Differential/Platelet - NMR, lipoprofile  3. Arteriosclerotic cardiovascular disease (ASCVD) -Continue to follow-up with cardiology and continue with current treatment for cholesterol along with therapeutic lifestyle changes - CBC with Differential/Platelet  4. Vitamin D deficiency -Continue with current treatment - CBC with  Differential/Platelet - VITAMIN D 25 Hydroxy (Vit-D Deficiency, Fractures)  5. Gastroesophageal reflux disease, esophagitis presence not specified -Continue with ranitidine and diet to avoid irritating foods on her stomach - CBC with Differential/Platelet  6. Situational stress -CK is much help from family members as possible  66. Aortic valve stenosis, mild -Follow-up with cardiology as planned  8. Family history of colon cancer -Follow-up with gastroenterology as planned. Last colonoscopy was in January 2015 and therefore another colonoscopy will be due in January 2020.  9. Left hip pain -Follow-up with orthopedic as planned  10. Abdominal aortic atherosclerosis -Continue with aggressive therapeutic lifestyle changes.  Patient Instructions                       Medicare Annual Wellness Visit  [facility] and the medical providers at [facility] strive to bring you the best medical care.  In doing so we not only want to address your current medical conditions and concerns but also to detect new conditions early and prevent illness, disease and health-related problems.    Medicare offers a yearly Wellness Visit which allows our clinical staff to assess your need for preventative services including immunizations, lifestyle education, counseling to decrease risk of preventable diseases and screening for fall risk and other medical concerns.    This visit is provided free of charge (no copay) for all Medicare recipients. The clinical pharmacists at [facility] have begun to conduct these Wellness Visits which will also include a thorough review of all your medications.    As you primary medical provider recommend that you make an appointment for your Annual Wellness Visit if you have not done so already this year.  You may set up this appointment before you leave today or you may call back ([phone]) and schedule an appointment.  Please make  sure when you call that you mention that you are scheduling your Annual Wellness Visit with the clinical pharmacist so that the appointment may be made for the proper length of time.     Continue current medications. Continue good therapeutic lifestyle changes which include good diet and exercise. Fall precautions discussed with patient. If an FOBT was given today- please return it to our front desk. If you are over 81 years old - you may need Prevnar 62 or the adult Pneumonia vaccine.  **Flu shots are available--- please call and schedule a FLU-CLINIC appointment**  After your visit with [place] today you will receive a survey in the mail or online from [employer] regarding your care with [place]. Please take a moment to fill this out. Your feedback is very important to [place] as you can help [place] better understand your patient needs as well as  improve your experience and satisfaction. WE CARE ABOUT YOU!!!  You will get your tetanus immunization today. We will give you the Pneumovax at the next visit. Keep appointment with cardiologist as planned this summer. Follow-up with orthopedist as planned Discuss hip pain and left thumb pain with him.   Arrie Senate MD

## 2017-05-01 NOTE — Patient Instructions (Addendum)
Medicare Annual Wellness Visit  [facility] and the medical providers at [facility] strive to bring you the best medical care.  In doing so we not only want to address your current medical conditions and concerns but also to detect new conditions early and prevent illness, disease and health-related problems.    Medicare offers a yearly Wellness Visit which allows our clinical staff to assess your need for preventative services including immunizations, lifestyle education, counseling to decrease risk of preventable diseases and screening for fall risk and other medical concerns.    This visit is provided free of charge (no copay) for all Medicare recipients. The clinical pharmacists at [facility] have begun to conduct these Wellness Visits which will also include a thorough review of all your medications.    As you primary medical provider recommend that you make an appointment for your Annual Wellness Visit if you have not done so already this year.  You may set up this appointment before you leave today or you may call back ([phone]) and schedule an appointment.  Please make sure when you call that you mention that you are scheduling your Annual Wellness Visit with the clinical pharmacist so that the appointment may be made for the proper length of time.     Continue current medications. Continue good therapeutic lifestyle changes which include good diet and exercise. Fall precautions discussed with patient. If an FOBT was given today- please return it to our front desk. If you are over 68 years old - you may need Prevnar 59 or the adult Pneumonia vaccine.  **Flu shots are available--- please call and schedule a FLU-CLINIC appointment**  After your visit with [place] today you will receive a survey in the mail or online from [employer] regarding your care with [place]. Please take a moment to fill this out. Your feedback is very  important to [place] as you can help [place] better understand your patient needs as well as improve your experience and satisfaction. WE CARE ABOUT YOU!!!  You will get your tetanus immunization today. We will give you the Pneumovax at the next visit. Keep appointment with cardiologist as planned this summer. Follow-up with orthopedist as planned Discuss hip pain and left thumb pain with him.

## 2017-05-01 NOTE — Telephone Encounter (Signed)
Okay to repeat if it's similar symptoms and no new trauma etc. 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

## 2017-05-02 LAB — BMP8+EGFR
BUN/Creatinine Ratio: 22 (ref 12–28)
BUN: 15 mg/dL (ref 8–27)
CALCIUM: 9.9 mg/dL (ref 8.7–10.3)
CHLORIDE: 103 mmol/L (ref 96–106)
CO2: 24 mmol/L (ref 18–29)
Creatinine, Ser: 0.69 mg/dL (ref 0.57–1.00)
GFR calc non Af Amer: 91 mL/min/{1.73_m2} (ref >59)
GFR, EST AFRICAN AMERICAN: 105 mL/min/{1.73_m2} (ref >59)
Glucose: 88 mg/dL (ref 65–99)
POTASSIUM: 4.2 mmol/L (ref 3.5–5.2)
Sodium: 142 mmol/L (ref 134–144)

## 2017-05-02 LAB — CBC WITH DIFFERENTIAL/PLATELET
BASOS ABS: 0 10*3/uL (ref 0.0–0.2)
Basos: 1 %
EOS (ABSOLUTE): 0.1 10*3/uL (ref 0.0–0.4)
Eos: 1 %
HEMATOCRIT: 39.5 % (ref 34.0–46.6)
Hemoglobin: 13.2 g/dL (ref 11.1–15.9)
Immature Grans (Abs): 0 10*3/uL (ref 0.0–0.1)
Immature Granulocytes: 0 %
LYMPHS ABS: 1.7 10*3/uL (ref 0.7–3.1)
Lymphs: 28 %
MCH: 31.6 pg (ref 26.6–33.0)
MCHC: 33.4 g/dL (ref 31.5–35.7)
MCV: 95 fL (ref 79–97)
MONOS ABS: 0.3 10*3/uL (ref 0.1–0.9)
Monocytes: 5 %
Neutrophils Absolute: 4.1 10*3/uL (ref 1.4–7.0)
Neutrophils: 65 %
PLATELETS: 281 10*3/uL (ref 150–379)
RBC: 4.18 x10E6/uL (ref 3.77–5.28)
RDW: 14.6 % (ref 12.3–15.4)
WBC: 6.3 10*3/uL (ref 3.4–10.8)

## 2017-05-02 LAB — HEPATIC FUNCTION PANEL
ALBUMIN: 4.1 g/dL (ref 3.6–4.8)
ALT: 16 IU/L (ref 0–32)
AST: 19 IU/L (ref 0–40)
Alkaline Phosphatase: 78 IU/L (ref 39–117)
BILIRUBIN TOTAL: 0.6 mg/dL (ref 0.0–1.2)
BILIRUBIN, DIRECT: 0.14 mg/dL (ref 0.00–0.40)
Total Protein: 6.4 g/dL (ref 6.0–8.5)

## 2017-05-02 LAB — NMR, LIPOPROFILE
CHOLESTEROL: 208 mg/dL — AB (ref 100–199)
HDL Cholesterol by NMR: 64 mg/dL (ref >39)
HDL PARTICLE NUMBER: 40.6 umol/L (ref >=30.5)
LDL Particle Number: 1645 nmol/L — ABNORMAL HIGH (ref <1000)
LDL SIZE: 21.5 nm (ref >20.5)
LDL-C: 121 mg/dL — AB (ref 0–99)
Small LDL Particle Number: 450 nmol/L (ref <=527)
Triglycerides by NMR: 116 mg/dL (ref 0–149)

## 2017-05-02 LAB — VITAMIN D 25 HYDROXY (VIT D DEFICIENCY, FRACTURES): Vit D, 25-Hydroxy: 54.5 ng/mL (ref 30.0–100.0)

## 2017-05-03 ENCOUNTER — Encounter (INDEPENDENT_AMBULATORY_CARE_PROVIDER_SITE_OTHER): Payer: Self-pay | Admitting: Orthopaedic Surgery

## 2017-05-03 ENCOUNTER — Ambulatory Visit (INDEPENDENT_AMBULATORY_CARE_PROVIDER_SITE_OTHER): Payer: Medicare Other | Admitting: Orthopaedic Surgery

## 2017-05-03 VITALS — BP 129/74 | HR 65 | Resp 14 | Ht 69.0 in | Wt 187.0 lb

## 2017-05-03 DIAGNOSIS — G8929 Other chronic pain: Secondary | ICD-10-CM | POA: Diagnosis not present

## 2017-05-03 DIAGNOSIS — M545 Low back pain: Secondary | ICD-10-CM | POA: Diagnosis not present

## 2017-05-03 DIAGNOSIS — M65312 Trigger thumb, left thumb: Secondary | ICD-10-CM

## 2017-05-03 MED ORDER — METHYLPREDNISOLONE ACETATE 40 MG/ML IJ SUSP
20.0000 mg | INTRAMUSCULAR | Status: AC | PRN
  Administered 2017-05-03: 20 mg

## 2017-05-03 MED ORDER — LIDOCAINE HCL 2 % IJ SOLN
1.0000 mL | INTRAMUSCULAR | Status: AC | PRN
  Administered 2017-05-03: 1 mL

## 2017-05-03 NOTE — Progress Notes (Signed)
Office Visit Note   Patient: Shelly Bennett           Date of Birth: [birth year]           MRN: [id number] Visit Date: 05/03/2017              Requested by: Chipper Herb, MD [address] [place], [place] [address] PCP: Chipper Herb, MD   Assessment & Plan: Visit Diagnoses:  1. Trigger thumb, left thumb   2. Chronic bilateral low back pain without sciatica   Left trigger thumb. Chronic back and left buttock pain that might be related to an old injury of the left common hamstring tendon insertion on the left issue per CT scan  Plan: Cortisone injection left trigger thumb. Long discussion regarding treatment options including surgical release. Follow-up with Dr. Ernestina Patches for consideration of injection into the hamstring origin  Follow-Up Instructions: Return if symptoms worsen or fail to improve.   Orders:  No orders of the defined types were placed in this encounter.  No orders of the defined types were placed in this encounter.     Procedures: Hand/UE Inj Date/Time: 05/03/2017 10:12 AM Performed by: Garald Balding Authorized by: Garald Balding   Consent Given by:  Patient Timeout: prior to procedure the correct patient, procedure, and site was verified   Indications:  Pain Condition: trigger finger   Location:  Thumb Site:  L thumb A1 Needle Size:  27 G Approach:  Dorsal Medications:  1 mL lidocaine 2 %; 20 mg methylPREDNISolone acetate 40 MG/ML Patient tolerance:  Patient tolerated the procedure well with no immediate complications     Clinical Data: No additional findings.   Subjective: Chief Complaint  Patient presents with  . Right Hand - Pain    Shelly Bennett is a 66 y o that presents with L thumb trigger finger all the time. Very painful day and night time. She also relates she has another injection with Dr. Ernestina Patches scheduled 05/15/17.  Shelly Bennett is having recurrent symptoms from a left trigger thumb. She's had a prior injection with resolution of  symptoms only to have it recur. It's become a real nuisance with catching. She just recently had a CT scan of her abdomen and pelvis with contrast because of some "stomach problems". Incidentally they found an asymmetric prominent thickening and coarse calcification at the origin of the left common hamstring tendon at the left issue consistent with chronic tendinosis. That certainly might explain the chronic buttock pain that is irritating when she sits for a length of time  HPI  Review of Systems   Objective: Vital Signs: BP 129/74   Pulse 65   Resp 14   Ht 5\' 9"  (1.753 m)   Wt 187 lb (84.8 kg)   BMI 27.62 kg/m   Physical Exam  Ortho Exam small painful thickening of the flexor tendon sheath left thumb at the level of the metacarpal phalangeal joint consistent with trigger thumb. Able to actively trigger the thumb. Chronic pain and in the deep deep in the left buttock. No percussible back pain.  Specialty Comments:  No specialty comments available.  Imaging: No results found.   PMFS History: Patient Active Problem List   Diagnosis Date Noted  . Otitis of left ear 02/01/2017  . Aortic atherosclerosis ([place]) 12/04/2016  . Allergic drug reaction 06/25/2015  . Dehydration with hyponatremia 06/25/2015  . HCAP (healthcare-associated pneumonia) 06/25/2015  . Vitamin D deficiency 08/19/2014  . Family history of  colon cancer-father at 58+ grandparent w/ rectal cancer 01/14/2014  . Osteopenia 08/12/2013  . CAD (coronary artery disease), native coronary artery 03/22/2013  . Aortic valve stenosis, mild 03/22/2013  . Hyperlipemia 03/11/2013  . Hypertension 03/11/2013  . Allergic rhinitis 03/11/2013  . Left shoulder pain 03/11/2013  . IBS (irritable bowel syndrome) - with chronic recurrent abdominal pain 02/01/2012  . Personal history of colonic polyps - adenoma 02/01/2012  . Coccydynia 02/01/2012   Past Medical History:  Diagnosis Date  . Adenomatous colon polyp   . Allergy     . Arthritis   . CAD (coronary artery disease)    Dr. Burt Knack follows   . Chronic headaches   . Cystocele   . Diverticulosis   . External hemorrhoids   . HLD (hyperlipidemia)   . HTN (hypertension)   . IBS (irritable bowel syndrome)   . Obesity   . Pneumonia     Family History  Problem Relation Age of Onset  . Prostate cancer Father   . Colon cancer Father 55  . Kidney disease Father   . Heart disease Father   . Hyperlipidemia Mother   . Hypertension Mother   . Kidney disease Mother   . Osteoporosis Mother   . Heart murmur Mother   . Hyperlipidemia Sister   . Hypertension Sister   . Heart disease Brother 70       not dx questionable   . Heart disease Maternal Grandfather     Past Surgical History:  Procedure Laterality Date  . CARPAL TUNNEL RELEASE    . COLONOSCOPY  12/15/2008   diverticulosis, external hemorrhoids  . KNEE SURGERY  05/22/2015   right  . LAPAROSCOPY     Social History   Occupational History  . teacher retired    Social History Main Topics  . Smoking status: Never Smoker  . Smokeless tobacco: Never Used  . Alcohol use No  . Drug use: No  . Sexual activity: Not Currently    Birth control/ protection: None     Garald Balding, MD   Note - This record has been created using [employer].  Chart creation errors have been sought, but may not always  have been located. Such creation errors do not reflect on  the standard of medical care.

## 2017-05-15 ENCOUNTER — Ambulatory Visit (INDEPENDENT_AMBULATORY_CARE_PROVIDER_SITE_OTHER): Payer: Self-pay

## 2017-05-15 ENCOUNTER — Ambulatory Visit (INDEPENDENT_AMBULATORY_CARE_PROVIDER_SITE_OTHER): Payer: Medicare Other | Admitting: Physical Medicine and Rehabilitation

## 2017-05-15 ENCOUNTER — Encounter (INDEPENDENT_AMBULATORY_CARE_PROVIDER_SITE_OTHER): Payer: Self-pay | Admitting: Physical Medicine and Rehabilitation

## 2017-05-15 VITALS — BP 146/78 | HR 56

## 2017-05-15 DIAGNOSIS — M48062 Spinal stenosis, lumbar region with neurogenic claudication: Secondary | ICD-10-CM

## 2017-05-15 DIAGNOSIS — M7072 Other bursitis of hip, left hip: Secondary | ICD-10-CM

## 2017-05-15 DIAGNOSIS — M5416 Radiculopathy, lumbar region: Secondary | ICD-10-CM

## 2017-05-15 DIAGNOSIS — M76899 Other specified enthesopathies of unspecified lower limb, excluding foot: Secondary | ICD-10-CM

## 2017-05-15 NOTE — Progress Notes (Deleted)
Left side lower back pain into buttock with sitting and going up and down stairs.Radiates down back of the leg to the knee. Difficulty getting moving after sitting any length of time. Leg feels weak.

## 2017-05-15 NOTE — Patient Instructions (Signed)

## 2017-05-15 NOTE — Progress Notes (Signed)
Shelly Bennett - 66 y.o. female MRN [id number]  Date of birth: 1951/02/07  Office Visit Note: Visit Date: 05/15/2017 PCP: Chipper Herb, MD Referred by: Chipper Herb, MD  Subjective: Chief Complaint  Patient presents with  . Lower Back - Pain   HPI: Shelly Bennett is a pleasant 66 year old female that we seen in the remote past for L3-4 epidural injection. She does have some moderate narrowing of the canal that level. She has recently seen Dr. Durward Fortes who follows her from an orthopedic standpoint. She is having predominantly left-sided lower back pain in the buttock region and then more in the hamstring musculature and over the issue. Is worse with sitting. It's also worse going up and down stairs. It does not go past the knee. She denies any paresthesias. She does feel like her leg feels weak. Dr. Durward Fortes suggested this may be pain related to tendinitis tendinopathy seen on recent CT scan of the pelvis. Her case is complicated by history of irritable bowel syndrome and coccydynia but no outright diagnosis of fibromyalgia. 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 Her pain has been ongoing now for several months with intermittent worsening. She's not gotten relief with any conservative care at this point. Her pain can be quite severe numbness and limiting what she can do.    Review of Systems  Constitutional: Negative for chills, fever, malaise/fatigue and weight loss.  HENT: Negative for hearing loss and sinus pain.   Eyes: Negative for blurred vision, double vision and photophobia.  Respiratory: Negative for cough and shortness of breath.   Cardiovascular: Negative for chest pain, palpitations and leg swelling.  Gastrointestinal: Negative for abdominal pain, nausea and vomiting.  Genitourinary: Negative for flank pain.  Musculoskeletal: Positive for back pain and joint pain. Negative for myalgias.  Skin:  Negative for itching and rash.  Neurological: Negative for tremors, focal weakness and weakness.  Endo/Heme/Allergies: Negative.   Psychiatric/Behavioral: Negative for depression.  All other systems reviewed and are negative.  Otherwise per HPI.  Assessment & Plan: Visit Diagnoses:  1. Lumbar radiculopathy   2. Ischial bursitis of left side   3. Hamstring tendonitis at origin   4. Spinal stenosis of lumbar region with neurogenic claudication     Plan: Findings:  Chronic worsening left low back and buttock pain consistent both with partially facet arthropathy and may be stenosis changes that we've seen in the past on MRI but also consistent with pain over the issue him while sitting. We will complete a diagnostic of therapeutic left ischial bursa or peri-tendinous injection. CT scan was reviewed that did show thickening of the insertion of the hamstring tendon to the edition on the left side compared to right. Depending on her relief would look at epidural injection. Patient did well with the injection did have some increased burning symptoms for short while she was sitting in the office after the injection.    Meds & Orders: No orders of the defined types were placed in this encounter.   Orders Placed This Encounter  Procedures  . Large Joint Injection/Arthrocentesis  . XR C-ARM NO REPORT    Follow-up: Return if symptoms worsen or fail to improve, for considering lumbar injection.   Procedures: Left ischium injection with fluoroscopic guidance. Date/Time: 05/15/2017 1:27 PM Performed by: Magnus Sinning Authorized by: Magnus Sinning   Consent Given by:  Parent Site marked: the procedure site was  marked   Timeout: prior to procedure the correct patient, procedure, and site was verified   Indications:  Pain and diagnostic evaluation Location:  Hip Hip joint: Left Ischium. Prep: patient was prepped and draped in usual sterile fashion   Needle Size:  22 G Needle Length:  3.5  inches Approach:  Lateral Ultrasound Guidance: No   Fluoroscopic Guidance: Yes   Arthrogram: No   Medications:  4 mL bupivacaine 0.25 %; 4 mL lidocaine 2 %; 80 mg triamcinolone acetonide 40 MG/ML Aspiration Attempted: No   Patient tolerance:  Patient tolerated the procedure well with no immediate complications  There was excellent flow of contrast outlined the greater trochanteric bursa without vascular uptake.     No notes on file   Clinical History: No specialty comments available.  She reports that she has never smoked. She has never used smokeless tobacco. No results for input(s): HGBA1C, LABURIC in the last 8760 hours.  Objective:  VS:  HT:    WT:   BMI:     BP:(!) 146/78  HR:(!) 56bpm  TEMP: ( )  RESP:100 % Physical Exam  Constitutional: She is oriented to person, place, and time. She appears well-developed and well-nourished.  Eyes: Conjunctivae and EOM are normal. Pupils are equal, round, and reactive to light.  Cardiovascular: Normal rate and intact distal pulses.   Pulmonary/Chest: Effort normal.  Musculoskeletal:  Patient ambulates without aid. She has difficult time going from sit to stand. She does have pain with extension. She has good distal strength.  Neurological: She is alert and oriented to person, place, and time. She exhibits normal muscle tone.  Skin: Skin is warm and dry. No rash noted. No erythema.  Psychiatric: She has a normal mood and affect. Her behavior is normal.  Nursing note and vitals reviewed.   Ortho Exam Imaging: Xr C-arm No Report  Result Date: 05/15/2017 Please see Notes or Procedures tab for imaging impression.   Past Medical/Family/Surgical/Social History: Medications & Allergies reviewed per EMR Patient Active Problem List   Diagnosis Date Noted  . Otitis of left ear 02/01/2017  . Aortic atherosclerosis ([place]) 12/04/2016  . Allergic drug reaction 06/25/2015  . Dehydration with hyponatremia 06/25/2015  . HCAP  (healthcare-associated pneumonia) 06/25/2015  . Vitamin D deficiency 08/19/2014  . Family history of colon cancer-father at 5+ grandparent w/ rectal cancer 01/14/2014  . Osteopenia 08/12/2013  . CAD (coronary artery disease), native coronary artery 03/22/2013  . Aortic valve stenosis, mild 03/22/2013  . Hyperlipemia 03/11/2013  . Hypertension 03/11/2013  . Allergic rhinitis 03/11/2013  . Left shoulder pain 03/11/2013  . IBS (irritable bowel syndrome) - with chronic recurrent abdominal pain 02/01/2012  . Personal history of colonic polyps - adenoma 02/01/2012  . Coccydynia 02/01/2012   Past Medical History:  Diagnosis Date  . Adenomatous colon polyp   . Allergy   . Arthritis   . CAD (coronary artery disease)    Dr. Burt Knack follows   . Chronic headaches   . Cystocele   . Diverticulosis   . External hemorrhoids   . HLD (hyperlipidemia)   . HTN (hypertension)   . IBS (irritable bowel syndrome)   . Obesity   . Pneumonia    Family History  Problem Relation Age of Onset  . Prostate cancer Father   . Colon cancer Father 26  . Kidney disease Father   . Heart disease Father   . Hyperlipidemia Mother   . Hypertension Mother   . Kidney disease Mother   .  Osteoporosis Mother   . Heart murmur Mother   . Hyperlipidemia Sister   . Hypertension Sister   . Heart disease Brother 36       not dx questionable   . Heart disease Maternal Grandfather    Past Surgical History:  Procedure Laterality Date  . CARPAL TUNNEL RELEASE    . COLONOSCOPY  12/15/2008   diverticulosis, external hemorrhoids  . KNEE SURGERY  05/22/2015   right  . LAPAROSCOPY     Social History   Occupational History  . teacher retired    Social History Main Topics  . Smoking status: Never Smoker  . Smokeless tobacco: Never Used  . Alcohol use No  . Drug use: No  . Sexual activity: Not Currently    Birth control/ protection: None

## 2017-05-16 MED ORDER — TRIAMCINOLONE ACETONIDE 40 MG/ML IJ SUSP
80.0000 mg | INTRAMUSCULAR | Status: AC | PRN
  Administered 2017-05-15: 80 mg via INTRA_ARTICULAR

## 2017-05-16 MED ORDER — LIDOCAINE HCL 2 % IJ SOLN
4.0000 mL | INTRAMUSCULAR | Status: AC | PRN
  Administered 2017-05-15: 4 mL

## 2017-05-16 MED ORDER — BUPIVACAINE HCL 0.25 % IJ SOLN
4.0000 mL | INTRAMUSCULAR | Status: AC | PRN
  Administered 2017-05-15: 4 mL via INTRA_ARTICULAR

## 2017-05-28 ENCOUNTER — Other Ambulatory Visit: Payer: Self-pay | Admitting: Family Medicine

## 2017-06-28 ENCOUNTER — Ambulatory Visit (HOSPITAL_COMMUNITY): Payer: Medicare Other | Attending: Cardiovascular Disease

## 2017-06-28 ENCOUNTER — Encounter: Payer: Self-pay | Admitting: Cardiovascular Disease

## 2017-06-28 ENCOUNTER — Ambulatory Visit (INDEPENDENT_AMBULATORY_CARE_PROVIDER_SITE_OTHER): Payer: Medicare Other | Admitting: Cardiovascular Disease

## 2017-06-28 ENCOUNTER — Other Ambulatory Visit: Payer: Self-pay

## 2017-06-28 VITALS — BP 140/70 | HR 48 | Ht 68.0 in | Wt 184.0 lb

## 2017-06-28 DIAGNOSIS — I071 Rheumatic tricuspid insufficiency: Secondary | ICD-10-CM | POA: Diagnosis not present

## 2017-06-28 DIAGNOSIS — E785 Hyperlipidemia, unspecified: Secondary | ICD-10-CM | POA: Diagnosis not present

## 2017-06-28 DIAGNOSIS — R001 Bradycardia, unspecified: Secondary | ICD-10-CM

## 2017-06-28 DIAGNOSIS — I25119 Atherosclerotic heart disease of native coronary artery with unspecified angina pectoris: Secondary | ICD-10-CM | POA: Diagnosis not present

## 2017-06-28 DIAGNOSIS — I2510 Atherosclerotic heart disease of native coronary artery without angina pectoris: Secondary | ICD-10-CM | POA: Insufficient documentation

## 2017-06-28 DIAGNOSIS — I350 Nonrheumatic aortic (valve) stenosis: Secondary | ICD-10-CM

## 2017-06-28 DIAGNOSIS — I10 Essential (primary) hypertension: Secondary | ICD-10-CM

## 2017-06-28 DIAGNOSIS — I371 Nonrheumatic pulmonary valve insufficiency: Secondary | ICD-10-CM | POA: Diagnosis not present

## 2017-06-28 NOTE — Progress Notes (Signed)
Cardiology Office Note Date:  06/28/2017   ID:  Leata, Dominy 09-01-1951, MRN [id number]  PCP:  Chipper Herb, MD  Cardiologist:  Sherren Mocha, MD    Chief Complaint  Patient presents with  . Follow-up     History of Present Illness: Shelly Bennett is a 66 y.o. female who presents for follow-up of nonobstructive coronary artery disease and bicuspid aortic valve. Other medical problems include hypertension and hyperlipidemia.  She is here alone today. She's been having some episodes of left-sided chest tightness that her nonradiating. These have occurred primarily at rest. There clearly is no association with physical exertion. Symptoms have been self-limited. She also describes episodes of "panic attack." When these occur she goes out for a walk and this helps her symptoms. She is compliant with her medications. She's been under a lot of stress. She complains of generalized fatigue. No lightheadedness or syncope.   Past Medical History:  Diagnosis Date  . Adenomatous colon polyp   . Allergy   . Arthritis   . CAD (coronary artery disease)    Dr. Burt Knack follows   . Chronic headaches   . Cystocele   . Diverticulosis   . External hemorrhoids   . HLD (hyperlipidemia)   . HTN (hypertension)   . IBS (irritable bowel syndrome)   . Obesity   . Pneumonia     Past Surgical History:  Procedure Laterality Date  . CARPAL TUNNEL RELEASE    . COLONOSCOPY  12/15/2008   diverticulosis, external hemorrhoids  . KNEE SURGERY  05/22/2015   right  . LAPAROSCOPY      Current Outpatient Prescriptions  Medication Sig Dispense Refill  . albuterol (PROVENTIL HFA;VENTOLIN HFA) 108 (90 Base) MCG/ACT inhaler Inhale 2 puffs into the lungs every 6 (six) hours as needed for wheezing or shortness of breath. 1 Inhaler 11  . amLODipine (NORVASC) 10 MG tablet Take 5 mg by mouth daily.    Marland Kitchen aspirin 81 MG tablet Take 81 mg by mouth daily.     . calcium carbonate 200 MG capsule Take 250 mg by  mouth 2 (two) times daily.     . Cholecalciferol (VITAMIN D-3) 5000 UNITS TABS Take 1 capsule by mouth as directed. Take one capsule by mouth daily Mon thru Friday    . desonide (DESOWEN) 0.05 % cream Apply 1 application topically daily as needed (face irritation).     . fexofenadine (ALLEGRA) 180 MG tablet Take 180 mg by mouth daily.    . fluticasone (FLONASE) 50 MCG/ACT nasal spray Place 2 sprays into both nostrils daily.    . fluticasone furoate-vilanterol (BREO ELLIPTA) 100-25 MCG/INH AEPB Inhale 1 puff into the lungs daily as needed (wheezing).    . hydrochlorothiazide (HYDRODIURIL) 25 MG tablet Take 12.5 mg by mouth daily.    Marland Kitchen HYDROcodone-acetaminophen (NORCO/VICODIN) 5-325 MG per tablet Take 0.5 tablets by mouth at bedtime as needed for moderate pain.     . mometasone (NASONEX) 50 MCG/ACT nasal spray 2 SPRAYS IN EACH NOSTRIL ONCE A DAY 17 g 4  . omega-3 acid ethyl esters (LOVAZA) 1 g capsule Take 1 g by mouth 4 (four) times daily.    . ranitidine (ZANTAC) 150 MG tablet Take 150 mg by mouth at bedtime.    . rosuvastatin (CRESTOR) 40 MG tablet Take 40 mg by mouth daily.     No current facility-administered medications for this visit.     Allergies:   Codeine; Mold extract [trichophyton]; Penicillins; Ace inhibitors;  Angiotensin receptor blockers; Levaquin [levofloxacin]; Vytorin [ezetimibe-simvastatin]; and Zetia [ezetimibe]   Social History:  The patient  reports that she has never smoked. She has never used smokeless tobacco. She reports that she does not drink alcohol or use drugs.   Family History:  The patient's  family history includes Colon cancer (age of onset: 40) in her father; Heart disease in her father and maternal grandfather; Heart disease (age of onset: 60) in her brother; Heart murmur in her mother; Hyperlipidemia in her mother and sister; Hypertension in her mother and sister; Kidney disease in her father and mother; Osteoporosis in her mother; Prostate cancer in her  father.    ROS:  Please see the history of present illness.  Otherwise, review of systems is positive for Foot pain, anxiety, back pain.  All other systems are reviewed and negative.    PHYSICAL EXAM: VS:  BP 140/70   Pulse (!) 48   Ht 5\' 8"  (1.727 m)   Wt 184 lb (83.5 kg)   BMI 27.98 kg/m  , BMI Body mass index is 27.98 kg/m. GEN: Well nourished, well developed, in no acute distress  HEENT: normal  Neck: no JVD, no masses. No carotid bruits Cardiac: bradycardic and regular with grade 2/6 SEM at the RUSB            Respiratory:  clear to auscultation bilaterally, normal work of breathing GI: soft, nontender, nondistended, + BS MS: no deformity or atrophy  Ext: no pretibial edema, pedal pulses 2+= bilaterally Skin: warm and dry, no rash Neuro:  Strength and sensation are intact Psych: euthymic mood, full affect  EKG:  EKG is ordered today. The ekg ordered today shows marked sinus bradycardia 49 bpm, otherwise within normal limits  Recent Labs: 05/01/2017: ALT 16; BUN 15; Creatinine, Ser 0.69; Hemoglobin 13.2; Platelets 281; Potassium 4.2; Sodium 142   Lipid Panel     Component Value Date/Time   CHOL 208 (H) 05/01/2017 0958   TRIG 116 05/01/2017 0958   HDL 64 05/01/2017 0958   LDLCALC 74 08/19/2014 0920      Wt Readings from Last 3 Encounters:  06/28/17 184 lb (83.5 kg)  05/03/17 187 lb (84.8 kg)  05/01/17 187 lb (84.8 kg)     Cardiac Studies Reviewed: 2D Echo images reviewed (see below)  ASSESSMENT AND PLAN: 1.  CAD, native vessel, with angina: She is having chest pain, left-sided pressure-like pain. Her symptoms are nonexertional and I suspect they're stress related. However, considering her risk factors a pressure-like pain I think it is reasonable to evaluate her with a stress test. A stress echocardiogram will be done.  2. Bicuspid aortic valve: Her echo from today is personally reviewed. Peak and mean gradients across the valve are 17 and 10 mmHg,  respectively. These are unchanged from her previous study 2 years ago and they were actually a little bit higher at 20 and 11 mmHg. Plan to repeat an echocardiogram and. Formal echo interpretation is pending.  3. Essential hypertension: Blood pressure is controlled  4. Hyperlipidemia: Treated with Crestor 40 mg daily. Most recent lipid testing is reviewed.  5. Bradycardia: Sinus bradycardia is noted with a heart rate of 49 bpm, not on any AV nodal blockade. Will assess chronotropic response to exercise stress test.  Current medicines are reviewed with the patient today.  The patient does not have concerns regarding medicines.  Labs/ tests ordered today include:   Orders Placed This Encounter  Procedures  . ECHOCARDIOGRAM STRESS TEST  Disposition:   FU one year  Signed, Sherren Mocha, MD  06/28/2017 10:56 AM    [facility] [address], [place], [place]  [address] Phone: [phone]; Fax: [phone]

## 2017-06-28 NOTE — Patient Instructions (Signed)
Medication Instructions:  Your physician recommends that you continue on your current medications as directed. Please refer to the Current Medication list given to you today.  Labwork: No new orders.   Testing/Procedures: Your physician has requested that you have a stress echocardiogram. For further information please visit [email or web address]. Please follow instruction sheet as given.  Follow-Up: Your physician wants you to follow-up in: 1 YEAR with Dr Burt Knack.  You will receive a reminder letter in the mail two months in advance. If you don't receive a letter, please call our office to schedule the follow-up appointment.   Any Other Special Instructions Will Be Listed Below (If Applicable).     If you need a refill on your cardiac medications before your next appointment, please call your pharmacy.

## 2017-07-01 ENCOUNTER — Other Ambulatory Visit: Payer: Self-pay

## 2017-07-01 DIAGNOSIS — R001 Bradycardia, unspecified: Secondary | ICD-10-CM

## 2017-07-08 ENCOUNTER — Other Ambulatory Visit (HOSPITAL_COMMUNITY): Payer: Medicare Other

## 2017-07-11 ENCOUNTER — Telehealth (HOSPITAL_COMMUNITY): Payer: Self-pay | Admitting: *Deleted

## 2017-07-11 NOTE — Telephone Encounter (Signed)
Left message on voicemail in reference to upcoming appointment scheduled for 07/16/17. Phone number given for a call back so details instructions can be given. Shelly Bennett

## 2017-07-16 ENCOUNTER — Ambulatory Visit (HOSPITAL_COMMUNITY): Payer: Medicare Other | Attending: Cardiology

## 2017-07-16 ENCOUNTER — Ambulatory Visit (HOSPITAL_BASED_OUTPATIENT_CLINIC_OR_DEPARTMENT_OTHER): Payer: Medicare Other

## 2017-07-16 DIAGNOSIS — I25119 Atherosclerotic heart disease of native coronary artery with unspecified angina pectoris: Secondary | ICD-10-CM | POA: Insufficient documentation

## 2017-07-16 DIAGNOSIS — R001 Bradycardia, unspecified: Secondary | ICD-10-CM

## 2017-07-16 DIAGNOSIS — I350 Nonrheumatic aortic (valve) stenosis: Secondary | ICD-10-CM

## 2017-08-17 ENCOUNTER — Other Ambulatory Visit: Payer: Self-pay | Admitting: Family Medicine

## 2017-09-04 ENCOUNTER — Ambulatory Visit (INDEPENDENT_AMBULATORY_CARE_PROVIDER_SITE_OTHER): Payer: Medicare Other | Admitting: Family Medicine

## 2017-09-04 ENCOUNTER — Encounter: Payer: Self-pay | Admitting: Family Medicine

## 2017-09-04 VITALS — BP 130/68 | HR 53 | Temp 97.3°F | Ht 68.0 in | Wt 188.0 lb

## 2017-09-04 DIAGNOSIS — R42 Dizziness and giddiness: Secondary | ICD-10-CM | POA: Diagnosis not present

## 2017-09-04 DIAGNOSIS — I10 Essential (primary) hypertension: Secondary | ICD-10-CM

## 2017-09-04 DIAGNOSIS — I2510 Atherosclerotic heart disease of native coronary artery without angina pectoris: Secondary | ICD-10-CM | POA: Diagnosis not present

## 2017-09-04 DIAGNOSIS — E7800 Pure hypercholesterolemia, unspecified: Secondary | ICD-10-CM | POA: Diagnosis not present

## 2017-09-04 DIAGNOSIS — F339 Major depressive disorder, recurrent, unspecified: Secondary | ICD-10-CM | POA: Diagnosis not present

## 2017-09-04 DIAGNOSIS — K219 Gastro-esophageal reflux disease without esophagitis: Secondary | ICD-10-CM | POA: Diagnosis not present

## 2017-09-04 DIAGNOSIS — F419 Anxiety disorder, unspecified: Secondary | ICD-10-CM | POA: Diagnosis not present

## 2017-09-04 DIAGNOSIS — D0439 Carcinoma in situ of skin of other parts of face: Secondary | ICD-10-CM

## 2017-09-04 DIAGNOSIS — Z23 Encounter for immunization: Secondary | ICD-10-CM

## 2017-09-04 DIAGNOSIS — E559 Vitamin D deficiency, unspecified: Secondary | ICD-10-CM

## 2017-09-04 MED ORDER — HYDROCHLOROTHIAZIDE 25 MG PO TABS: 12.5000 mg | ORAL_TABLET | Freq: Every day | ORAL | 3 refills | Status: DC

## 2017-09-04 MED ORDER — PAROXETINE HCL 20 MG PO TABS: 20.0000 mg | ORAL_TABLET | Freq: Every day | ORAL | 1 refills | Status: DC

## 2017-09-04 MED ORDER — BUSPIRONE HCL 15 MG PO TABS: 15.0000 mg | ORAL_TABLET | Freq: Two times a day (BID) | ORAL | 1 refills | Status: DC | PRN

## 2017-09-04 MED ORDER — ROSUVASTATIN CALCIUM 40 MG PO TABS: 40.0000 mg | ORAL_TABLET | Freq: Every day | ORAL | 3 refills | Status: DC

## 2017-09-04 MED ORDER — AMLODIPINE BESYLATE 10 MG PO TABS: 5.0000 mg | ORAL_TABLET | Freq: Every day | ORAL | 3 refills | Status: DC

## 2017-09-04 NOTE — Patient Instructions (Addendum)
Medicare Annual Wellness Visit  [facility] and the medical providers at [facility] strive to bring you the best medical care.  In doing so we not only want to address your current medical conditions and concerns but also to detect new conditions early and prevent illness, disease and health-related problems.    Medicare offers a yearly Wellness Visit which allows our clinical staff to assess your need for preventative services including immunizations, lifestyle education, counseling to decrease risk of preventable diseases and screening for fall risk and other medical concerns.    This visit is provided free of charge (no copay) for all Medicare recipients. The clinical pharmacists at [facility] have begun to conduct these Wellness Visits which will also include a thorough review of all your medications.    As you primary medical provider recommend that you make an appointment for your Annual Wellness Visit if you have not done so already this year.  You may set up this appointment before you leave today or you may call back ([phone]) and schedule an appointment.  Please make sure when you call that you mention that you are scheduling your Annual Wellness Visit with the clinical pharmacist so that the appointment may be made for the proper length of time.     Continue current medications. Continue good therapeutic lifestyle changes which include good diet and exercise. Fall precautions discussed with patient. If an FOBT was given today- please return it to our front desk. If you are over 67 years old - you may need Prevnar 83 or the adult Pneumonia vaccine.  **Flu shots are available--- please call and schedule a FLU-CLINIC appointment**  After your visit with [place] today you will receive a survey in the mail or online from [employer] regarding your care with [place]. Please take a moment to fill this out. Your feedback is very  important to [place] as you can help [place] better understand your patient needs as well as improve your experience and satisfaction. WE CARE ABOUT YOU!!!   Follow-up with cardiology as planned We will arrange for you to have a visit with the neurologist because of the concerns you have with the fogginess in your head and the sensation in your head that something is not right. Continue to watch sodium intake and limit caffeine intake as much as possible Start antidepressant by taking one daily and take BuSpar one twice a day if needed for anxiety Return the FOBT Discontinue the Zantac for the time being and restart omeprazole through the month of October one daily before breakfast, if the abdominal discomfort does not improve we will possibly have to get you to see the gastroenterologist for an endoscopy. Please let us know if this does not resolve the discomfort in your epigastrium. Do not forget to get your flu shot

## 2017-09-04 NOTE — Progress Notes (Signed)
Subjective:    Patient ID: Sharl Ma, female    DOB: May 22, 1951, 66 y.o.   MRN: [id number]  HPI Pt here for follow up and management of chronic medical problems which includes hyperlipidemia and hypertension. She is taking medication regularly.The patient is doing well overall but does complain of feeling foggy. She also complains of anxiety right foot pain and left thumb pain. She is requesting refills on her amlodipine Crestor and HCTZ. She is also in need of her Pneumovax. She will be given an FOBT to return and will get lab work today. She has a dermatology appointment coming up soon in October. The patient denies any chest pain or shortness of breath. She just saw her cardiologist, Dr. Burt Knack had a thorough workup on her heart with stress testing and echocardiogram etc. and the cardiologist felt good about her heart situation. She does also complain of increased discomfort in the epigastric area and heartburn. She is only taking Zantac 3 days a week currently. She was taking a proton pump inhibitor. She denies any blood in the stool or black tarry bowel movements and hasn't had no change in bowel habits. She was on omeprazole at one time and we will have her restart the omeprazole and return an FOBT. Passing her water without problems. We will have her do the omeprazole through the month of October and then try to switch back to Zantac twice daily. A lot of the patient's concern has to do with this fogginess and forgetfulness and she thinks a lot of this is stress related and so does her family. She is worried about her children constantly and taking care of her invalid mother. She feels like and they feel like that she needs an antidepressant and something for anxiety and we will certainly consider giving her this but may also just get a one time visit to the neurologist because of the funny sensations that she is having in her head and with her thinking. 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 She has Mohs surgery scheduled for this sometime the end of October.    Patient Active Problem List   Diagnosis Date Noted  . Otitis of left ear 02/01/2017  . Aortic atherosclerosis ([place]) 12/04/2016  . Allergic drug reaction 06/25/2015  . Dehydration with hyponatremia 06/25/2015  . HCAP (healthcare-associated pneumonia) 06/25/2015  . Vitamin D deficiency 08/19/2014  . Family history of colon cancer-father at 25+ grandparent w/ rectal cancer 01/14/2014  . Osteopenia 08/12/2013  . CAD (coronary artery disease), native coronary artery 03/22/2013  . Aortic valve stenosis, mild 03/22/2013  . Hyperlipemia 03/11/2013  . Hypertension 03/11/2013  . Allergic rhinitis 03/11/2013  . Left shoulder pain 03/11/2013  . IBS (irritable bowel syndrome) - with chronic recurrent abdominal pain 02/01/2012  . Personal history of colonic polyps - adenoma 02/01/2012  . Coccydynia 02/01/2012   Outpatient Encounter Prescriptions as of 09/04/2017  Medication Sig  . albuterol (PROVENTIL HFA;VENTOLIN HFA) 108 (90 Base) MCG/ACT inhaler Inhale 2 puffs into the lungs every 6 (six) hours as needed for wheezing or shortness of breath.  Marland Kitchen amLODipine (NORVASC) 10 MG tablet Take 5 mg by mouth daily.  Marland Kitchen aspirin 81 MG tablet Take 81 mg by mouth daily.   . calcium carbonate 200 MG capsule Take 250 mg by mouth 2 (two) times daily.   . Cholecalciferol (VITAMIN D-3) 5000 UNITS TABS Take 1 capsule by mouth as directed. Take one  capsule by mouth daily Mon thru Friday  . fexofenadine (ALLEGRA) 180 MG tablet Take 180 mg by mouth daily.  . fluticasone (FLONASE) 50 MCG/ACT nasal spray Place 2 sprays into both nostrils daily.  . hydrochlorothiazide (HYDRODIURIL) 25 MG tablet Take 12.5 mg by mouth daily.  Marland Kitchen HYDROcodone-acetaminophen (NORCO/VICODIN) 5-325 MG per tablet Take 0.5 tablets by mouth at bedtime as needed for moderate pain.   . mometasone (NASONEX) 50 MCG/ACT nasal  spray 2 SPRAYS IN EACH NOSTRIL ONCE A DAY  . omega-3 acid ethyl esters (LOVAZA) 1 g capsule TAKE (1) CAPSULE FOUR TIMES DAILY.  . ranitidine (ZANTAC) 150 MG tablet Take 150 mg by mouth at bedtime.  . rosuvastatin (CRESTOR) 40 MG tablet Take 40 mg by mouth daily.  . fluticasone furoate-vilanterol (BREO ELLIPTA) 100-25 MCG/INH AEPB Inhale 1 puff into the lungs daily as needed (wheezing).  . [DISCONTINUED] amLODipine (NORVASC) 10 MG tablet TAKE 1 TABLET ONCE A DAY  . [DISCONTINUED] desonide (DESOWEN) 0.05 % cream Apply 1 application topically daily as needed (face irritation).   . [DISCONTINUED] omega-3 acid ethyl esters (LOVAZA) 1 g capsule Take 1 g by mouth 4 (four) times daily.   No facility-administered encounter medications on file as of 09/04/2017.       Review of Systems  Constitutional: Negative.   HENT: Negative.   Eyes: Negative.   Respiratory: Negative.   Cardiovascular: Negative.   Gastrointestinal: Negative.   Endocrine: Negative.   Genitourinary: Negative.   Musculoskeletal: Positive for arthralgias (left thumb pain // right foot pain).  Skin: Negative.   Allergic/Immunologic: Negative.   Neurological: Negative.   Hematological: Negative.   Psychiatric/Behavioral: The patient is nervous/anxious (anxiety ).        Objective:   Physical Exam  Constitutional: She is oriented to person, place, and time. She appears well-developed and well-nourished. She appears distressed.  Patient is doing well but is somewhat anxious and tearful and thinks that anxiety is playing a big role in the way she is feeling. She has had a thorough cardiac workup and has been followed regularly by the ophthalmologist and the dermatologist.  HENT:  Head: Normocephalic and atraumatic.  Right Ear: External ear normal.  Left Ear: External ear normal.  Nose: Nose normal.  Mouth/Throat: Oropharynx is clear and moist.  Eyes: Pupils are equal, round, and reactive to light. Conjunctivae and EOM are  normal. Right eye exhibits no discharge. Left eye exhibits no discharge. No scleral icterus.  Neck: Normal range of motion. Neck supple. No thyromegaly present.  No bruits thyromegaly or anterior cervical adenopathy  Cardiovascular: Normal rate, regular rhythm, normal heart sounds and intact distal pulses.   No murmur heard. The heart has a regular rate and rhythm at 60/m  Pulmonary/Chest: Effort normal and breath sounds normal. No respiratory distress. She has no wheezes. She has no rales.  Clear anteriorly and posteriorly  Abdominal: Soft. Bowel sounds are normal. She exhibits no mass. There is no tenderness. There is no rebound and no guarding.  Slight epigastric and left upper quadrant tenderness without masses or bruits  Musculoskeletal: Normal range of motion. She exhibits no edema.  Some discomfort in back and lower extremities with arising from a sitting position  Lymphadenopathy:    She has no cervical adenopathy.  Neurological: She is alert and oriented to person, place, and time. She has normal reflexes. No cranial nerve deficit.  Skin: Skin is warm and dry. No rash noted. There is erythema.  The patient has recently had a  biopsy of the right cheek and there is redness in the cheek but the area is healing and was diagnosed with squamous cell carcinoma and Mohs surgery is planned for this at some time later.  Psychiatric: She has a normal mood and affect. Her behavior is normal. Judgment and thought content normal.  Tearful and crying and anxious.  Nursing note and vitals reviewed.  BP 130/68 (BP Location: Left Arm)   Pulse (!) 53   Temp (!) 97.3 F (36.3 C) (Oral)   Ht 5' 8"  (1.727 m)   Wt 188 lb (85.3 kg)   BMI 28.59 kg/m   The Pneumovax and flu shot will be given today as the patient will be on a trip in a month to [place]:  1. Pure hypercholesterolemia -Continue with current treatment and aggressive therapeutic lifestyle changes pending results  of lab work - CBC with Differential/Platelet  2. Hypertension -Blood pressure is good today and she will continue with current treatment and sodium restriction - CBC with Differential/Platelet - BMP8+EGFR - Hepatic function panel  3. Arteriosclerotic cardiovascular disease (ASCVD) -Continue follow-up with cardiology - CBC with Differential/Platelet - Lipid panel  4. Vitamin D deficiency -Continue vitamin D replacement pending results of lab work - CBC with Differential/Platelet - VITAMIN D 25 Hydroxy (Vit-D Deficiency, Fractures)  5. Gastroesophageal reflux disease, esophagitis presence not specified -Return the FOBT -Start omeprazole and hold ranitidine at least through the month of October and then starting in November restart ranitidine again twice daily before breakfast and supper. If symptoms do not resolve with the epigastric discomfort the patient should get back in touch with [place] regarding this and she will need to see the gastroenterologist. - CBC with Differential/Platelet  6. Anxiety -We will start Paxil 20 mg 1 daily at bedtime and she will also be given a prescription for BuSpar 15 mg one half to one twice daily if needed for anxiety - Vitamin B12 - Ambulatory referral to Neurology  7. Light headed - Ambulatory referral to Neurology  8. Depression, recurrent ([place]) -Start Paxil and a prescription for BuSpar.  9. 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 rosuvastatin (CRESTOR) 40 MG tablet    Sig: Take 1 tablet (40 mg total) by mouth daily.    Dispense:  90 tablet    Refill:  3  . amLODipine (NORVASC) 10 MG tablet    Sig: Take 0.5 tablets (5 mg total) by mouth daily.    Dispense:  45 tablet    Refill:  3  . hydrochlorothiazide (HYDRODIURIL) 25 MG tablet    Sig: Take 0.5 tablets (12.5 mg total) by mouth daily.    Dispense:  45 tablet    Refill:  3  .  PARoxetine (PAXIL) 20 MG tablet    Sig: Take 1 tablet (20 mg total) by mouth at bedtime.    Dispense:  90 tablet    Refill:  1  . busPIRone (BUSPAR) 15 MG tablet    Sig: Take 1 tablet (15 mg total) by mouth 2 (two) times daily as needed.    Dispense:  60 tablet    Refill:  1   Patient Instructions                       Medicare Annual Wellness Visit  [facility] and the medical providers at [facility] strive to bring you the best medical care.  In doing so we not only want to address your current medical conditions and concerns but also to detect new conditions early and prevent illness, disease and health-related problems.    Medicare offers a yearly Wellness Visit which allows our clinical staff to assess your need for preventative services including immunizations, lifestyle education, counseling to decrease risk of preventable diseases and screening for fall risk and other medical concerns.    This visit is provided free of charge (no copay) for all Medicare recipients. The clinical pharmacists at [facility] have begun to conduct these Wellness Visits which will also include a thorough review of all your medications.    As you primary medical provider recommend that you make an appointment for your Annual Wellness Visit if you have not done so already this year.  You may set up this appointment before you leave today or you may call back ([phone]) and schedule an appointment.  Please make sure when you call that you mention that you are scheduling your Annual Wellness Visit with the clinical pharmacist so that the appointment may be made for the proper length of time.     Continue current medications. Continue good therapeutic lifestyle changes which include good diet and exercise. Fall precautions discussed with patient. If an FOBT was given today- please return it to our front desk. If you are over 26 years old - you may need Prevnar 62  or the adult Pneumonia vaccine.  **Flu shots are available--- please call and schedule a FLU-CLINIC appointment**  After your visit with [place] today you will receive a survey in the mail or online from [employer] regarding your care with [place]. Please take a moment to fill this out. Your feedback is very important to [place] as you can help [place] better understand your patient needs as well as improve your experience and satisfaction. WE CARE ABOUT YOU!!!   Follow-up with cardiology as planned We will arrange for you to have a visit with the neurologist because of the concerns you have with the fogginess in your head and the sensation in your head that something is not right. Continue to watch sodium intake and limit caffeine intake as much as possible Start antidepressant by taking one daily and take BuSpar one twice a day if needed for anxiety Return the FOBT Discontinue the Zantac for the time being and restart omeprazole through the month of October one daily before breakfast, if the abdominal discomfort does not improve we will possibly have to get you to see the gastroenterologist for an endoscopy. Please let us know if this does not resolve the discomfort in your epigastrium. Do not forget to get your flu shot    Arrie Senate MD

## 2017-09-05 LAB — HEPATIC FUNCTION PANEL
ALK PHOS: 70 IU/L (ref 39–117)
ALT: 19 IU/L (ref 0–32)
AST: 25 IU/L (ref 0–40)
Albumin: 4.6 g/dL (ref 3.6–4.8)
BILIRUBIN, DIRECT: 0.14 mg/dL (ref 0.00–0.40)
Bilirubin Total: 0.6 mg/dL (ref 0.0–1.2)
Total Protein: 6.8 g/dL (ref 6.0–8.5)

## 2017-09-05 LAB — BMP8+EGFR
BUN / CREAT RATIO: 18 (ref 12–28)
BUN: 13 mg/dL (ref 8–27)
CO2: 27 mmol/L (ref 20–29)
Calcium: 10.1 mg/dL (ref 8.7–10.3)
Chloride: 104 mmol/L (ref 96–106)
Creatinine, Ser: 0.74 mg/dL (ref 0.57–1.00)
GFR calc Af Amer: 97 mL/min/{1.73_m2} (ref >59)
GFR, EST NON AFRICAN AMERICAN: 84 mL/min/{1.73_m2} (ref >59)
Glucose: 88 mg/dL (ref 65–99)
POTASSIUM: 4.2 mmol/L (ref 3.5–5.2)
SODIUM: 142 mmol/L (ref 134–144)

## 2017-09-05 LAB — CBC WITH DIFFERENTIAL/PLATELET
BASOS ABS: 0 10*3/uL (ref 0.0–0.2)
Basos: 0 %
EOS (ABSOLUTE): 0.1 10*3/uL (ref 0.0–0.4)
Eos: 1 %
HEMATOCRIT: 42.6 % (ref 34.0–46.6)
Hemoglobin: 14.2 g/dL (ref 11.1–15.9)
Immature Grans (Abs): 0 10*3/uL (ref 0.0–0.1)
Immature Granulocytes: 0 %
LYMPHS ABS: 1.8 10*3/uL (ref 0.7–3.1)
Lymphs: 25 %
MCH: 31.4 pg (ref 26.6–33.0)
MCHC: 33.3 g/dL (ref 31.5–35.7)
MCV: 94 fL (ref 79–97)
MONOS ABS: 0.4 10*3/uL (ref 0.1–0.9)
Monocytes: 5 %
Neutrophils Absolute: 5 10*3/uL (ref 1.4–7.0)
Neutrophils: 69 %
Platelets: 276 10*3/uL (ref 150–379)
RBC: 4.52 x10E6/uL (ref 3.77–5.28)
RDW: 13.7 % (ref 12.3–15.4)
WBC: 7.3 10*3/uL (ref 3.4–10.8)

## 2017-09-05 LAB — LIPID PANEL
Chol/HDL Ratio: 2.5 ratio (ref 0.0–4.4)
Cholesterol, Total: 169 mg/dL (ref 100–199)
HDL: 67 mg/dL (ref >39)
LDL CALC: 82 mg/dL (ref 0–99)
TRIGLYCERIDES: 101 mg/dL (ref 0–149)
VLDL Cholesterol Cal: 20 mg/dL (ref 5–40)

## 2017-09-05 LAB — VITAMIN D 25 HYDROXY (VIT D DEFICIENCY, FRACTURES): Vit D, 25-Hydroxy: 57.3 ng/mL (ref 30.0–100.0)

## 2017-09-05 LAB — VITAMIN B12: VITAMIN B 12: 474 pg/mL (ref 232–1245)

## 2017-09-16 ENCOUNTER — Other Ambulatory Visit: Payer: Medicare Other

## 2017-09-16 DIAGNOSIS — Z1211 Encounter for screening for malignant neoplasm of colon: Secondary | ICD-10-CM

## 2017-09-18 ENCOUNTER — Telehealth: Payer: Self-pay | Admitting: Family Medicine

## 2017-09-18 NOTE — Telephone Encounter (Signed)
Aware, medication list is ready with updated name change.

## 2017-09-20 LAB — FECAL OCCULT BLOOD, IMMUNOCHEMICAL: FECAL OCCULT BLD: NEGATIVE

## 2017-10-12 ENCOUNTER — Other Ambulatory Visit: Payer: Self-pay | Admitting: Family Medicine

## 2017-10-22 ENCOUNTER — Encounter: Payer: Self-pay | Admitting: Neurology

## 2017-10-22 ENCOUNTER — Ambulatory Visit: Payer: Medicare Other | Admitting: Neurology

## 2017-10-22 VITALS — BP 154/85 | HR 67 | Ht 68.0 in | Wt 192.0 lb

## 2017-10-22 DIAGNOSIS — R413 Other amnesia: Secondary | ICD-10-CM

## 2017-10-22 DIAGNOSIS — R4189 Other symptoms and signs involving cognitive functions and awareness: Secondary | ICD-10-CM | POA: Insufficient documentation

## 2017-10-22 DIAGNOSIS — G3184 Mild cognitive impairment, so stated: Secondary | ICD-10-CM | POA: Insufficient documentation

## 2017-10-22 DIAGNOSIS — F419 Anxiety disorder, unspecified: Secondary | ICD-10-CM

## 2017-10-22 NOTE — Progress Notes (Signed)
PATIENT: Shelly Bennett DOB: Jan 20, 1951  Chief Complaint  Patient presents with  . Anxiety    Reports having severe anxiety that she contributes to being the primary caregiver for her 67 year old mother.  She was placed on Buspar and Paxil by her PCP which have improved her symptoms.  . Memory Loss    MMSE 29/30 - 10 animals.  Feels her memory loss/forgetfulness is related to her stress and age.     HISTORICAL  Shelly Bennett is a 66 years old female, seen in refer by her primary care doctor Redge Gainer for evaluation of anxiety, memory loss, initial evaluation was on November sixth 2018.   I have reviewed and summarized the referring note, she has history of hypertension, coronary artery disease, hyperlipidemia, is a retired Automotive engineer. She drove herself to clinic today.  She reported stress, she is the main caretaker of her mother that is 17 years old, since 2017, she was noted to repeat herself, forget peoples name, displaced pains,  Since 2018, she also suffered depression anxiety, because of the strained relationship, she is taking BuSpar, and Paxil since August 2018 which seems to help her some,  She has mild gait abnormality due to previous left knee injury.   Laboratory evaluations, B12 474, normal liver functional tests, vitamin D level 57, lipid profile LDL 82, total cholesterol 169,  REVIEW OF SYSTEMS: Full 14 system review of systems performed and notable only for memory loss, confusion, depression, anxiety  ALLERGIES: Allergies  Allergen Reactions  . Codeine     ? Reaction ER visit.   . Mold Extract [Trichophyton]     Migraine  . Penicillins Other (See Comments)    "drew my legs up as child" Pt has tolerated cephalexin in the past.  . Ace Inhibitors Cough  . Angiotensin Receptor Blockers Cough  . Levaquin [Levofloxacin] Rash    Per notes from outpatient provider  . Vytorin [Ezetimibe-Simvastatin] Other (See Comments)   cramping  . Zetia [Ezetimibe] Other (See Comments)    cramping    HOME MEDICATIONS: Current Outpatient Medications  Medication Sig Dispense Refill  . albuterol (PROVENTIL HFA;VENTOLIN HFA) 108 (90 Base) MCG/ACT inhaler Inhale 2 puffs into the lungs every 6 (six) hours as needed for wheezing or shortness of breath. 1 Inhaler 11  . amLODipine (NORVASC) 10 MG tablet Take 0.5 tablets (5 mg total) by mouth daily. 45 tablet 3  . aspirin 81 MG tablet Take 81 mg by mouth daily.     . busPIRone (BUSPAR) 15 MG tablet Take 1 tablet (15 mg total) by mouth 2 (two) times daily as needed. 60 tablet 1  . calcium carbonate 200 MG capsule Take 250 mg by mouth 2 (two) times daily.     . Cholecalciferol (VITAMIN D-3) 5000 UNITS TABS Take 1 capsule by mouth as directed. Take one capsule by mouth daily Mon thru Friday    . fexofenadine (ALLEGRA) 180 MG tablet Take 180 mg by mouth daily.    . fluticasone (FLONASE) 50 MCG/ACT nasal spray Place 2 sprays into both nostrils daily.    . fluticasone furoate-vilanterol (BREO ELLIPTA) 100-25 MCG/INH AEPB Inhale 1 puff into the lungs daily as needed (wheezing).    . hydrochlorothiazide (HYDRODIURIL) 25 MG tablet Take 0.5 tablets (12.5 mg total) by mouth daily. 45 tablet 3  . HYDROcodone-acetaminophen (NORCO/VICODIN) 5-325 MG per tablet Take 0.5 tablets by mouth at bedtime as needed for moderate pain.     . mometasone (NASONEX)  50 MCG/ACT nasal spray 2 SPRAYS IN EACH NOSTRIL ONCE A DAY 17 g 4  . omega-3 acid ethyl esters (LOVAZA) 1 g capsule TAKE (1) CAPSULE FOUR TIMES DAILY. 360 capsule 0  . PARoxetine (PAXIL) 20 MG tablet Take 1 tablet (20 mg total) by mouth at bedtime. 90 tablet 1  . ranitidine (ZANTAC) 150 MG tablet Take 150 mg by mouth at bedtime.    . rosuvastatin (CRESTOR) 40 MG tablet Take 1 tablet (40 mg total) by mouth daily. 90 tablet 3   No current facility-administered medications for this visit.     PAST MEDICAL HISTORY: Past Medical History:  Diagnosis  Date  . Adenomatous colon polyp   . Allergy   . Anxiety   . Arthritis   . CAD (coronary artery disease)    Dr. Burt Knack follows   . Chronic headaches   . Cystocele   . Diverticulosis   . External hemorrhoids   . HLD (hyperlipidemia)   . HTN (hypertension)   . IBS (irritable bowel syndrome)   . Memory loss   . Obesity   . Pneumonia   . Skin cancer    squamous cell - face    PAST SURGICAL HISTORY: Past Surgical History:  Procedure Laterality Date  . CARPAL TUNNEL RELEASE    . COLONOSCOPY  12/15/2008   diverticulosis, external hemorrhoids  . KNEE SURGERY  05/22/2015   right  . LAPAROSCOPY    . SKIN SURGERY     squamous cell - right face     FAMILY HISTORY: Family History  Problem Relation Age of Onset  . Prostate cancer Father   . Colon cancer Father 37  . Kidney disease Father   . Heart disease Father   . Alzheimer's disease Father   . Hyperlipidemia Mother   . Hypertension Mother   . Kidney disease Mother   . Osteoporosis Mother   . Heart murmur Mother   . Dementia Mother   . Hyperlipidemia Sister   . Hypertension Sister   . Heart disease Brother 57       not dx questionable   . Heart disease Maternal Grandfather     SOCIAL HISTORY:  Social History   Socioeconomic History  . Marital status: Married    Spouse name: [name]  . Number of children: 4  . Years of education: 16  . Highest education level: Bachelor's degree (e.g., BA, AB, BS)  Social Needs  . Financial resource strain: Not on file  . Food insecurity - worry: Not on file  . Food insecurity - inability: Not on file  . Transportation needs - medical: Not on file  . Transportation needs - non-medical: Not on file  Occupational History  . Occupation: Pharmacist, hospital retired  Tobacco Use  . Smoking status: Never Smoker  . Smokeless tobacco: Never Used  Substance and Sexual Activity  . Alcohol use: No  . Drug use: No  . Sexual activity: Not Currently    Birth control/protection: None  Other  Topics Concern  . Not on file  Social History Narrative   Lives at home with her husband.   Right-handed.   One cup coffee each morning and one soda each day.     PHYSICAL EXAM   Vitals:   10/22/17 0750  BP: (!) 154/85  Pulse: 67  Weight: 192 lb (87.1 kg)  Height: 5\' 8"  (1.727 m)    Not recorded      Body mass index is 29.19 kg/m.  PHYSICAL EXAMNIATION:  Gen: NAD, conversant, well nourised, obese, well groomed                     Cardiovascular: Regular rate rhythm, no peripheral edema, warm, nontender. Eyes: Conjunctivae clear without exudates or hemorrhage Neck: Supple, no carotid bruits. Pulmonary: Clear to auscultation bilaterally   NEUROLOGICAL EXAM:  MENTAL STATUS: MMSE - Mini Mental State Exam 10/22/2017 03/22/2016  Orientation to time 5 5  Orientation to Place 5 5  Registration 3 3  Attention/ Calculation 5 5  Recall 2 3  Language- name 2 objects 2 2  Language- repeat 1 1  Language- follow 3 step command 3 3  Language- read & follow direction 1 1  Write a sentence 1 1  Copy design 1 1  Total score 29 30  animal naming 10   CRANIAL NERVES: CN II: Visual fields are full to confrontation. Fundoscopic exam is normal with sharp discs and no vascular changes. Pupils are round equal and briskly reactive to light. CN III, IV, VI: extraocular movement are normal. No ptosis. CN V: Facial sensation is intact to pinprick in all 3 divisions bilaterally. Corneal responses are intact.  CN VII: Face is symmetric with normal eye closure and smile. CN VIII: Hearing is normal to rubbing fingers CN IX, X: Palate elevates symmetrically. Phonation is normal. CN XI: Head turning and shoulder shrug are intact CN XII: Tongue is midline with normal movements and no atrophy.  MOTOR: There is no pronator drift of out-stretched arms. Muscle bulk and tone are normal. Muscle strength is normal.  REFLEXES: Reflexes are 2+ and symmetric at the biceps, triceps, knees, and ankles.  Plantar responses are flexor.  SENSORY: Intact to light touch, pinprick, positional sensation and vibratory sensation are intact in fingers and toes.  COORDINATION: Rapid alternating movements and fine finger movements are intact. There is no dysmetria on finger-to-nose and heel-knee-shin.    GAIT/STANCE: Need to push up to get up from seated position, mildly antalgic, cautious.   DIAGNOSTIC DATA (LABS, IMAGING, TESTING) - I reviewed patient records, labs, notes, testing and imaging myself where available.   ASSESSMENT AND PLAN  Shelly Bennett is a 66 y.o. 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  Complete evaluation with MRI brain.  TSH. Depression/Anxiety:  Continue Buspar, Paxil   Marcial Pacas, M.D. Ph.D.  [facility] [address], [place], [place] [address] Ph: [phone] Fax: [phone]  CC: Referring Provider

## 2017-10-23 LAB — TSH: TSH: 1.9 u[IU]/mL (ref 0.450–4.500)

## 2017-11-02 ENCOUNTER — Ambulatory Visit
Admission: RE | Admit: 2017-11-02 | Discharge: 2017-11-02 | Disposition: A | Discharge status: Home / Self Care | Payer: Medicare Other | Source: Ambulatory Visit | Attending: Neurology | Admitting: Neurology

## 2017-11-02 DIAGNOSIS — R413 Other amnesia: Secondary | ICD-10-CM

## 2018-01-14 ENCOUNTER — Ambulatory Visit (INDEPENDENT_AMBULATORY_CARE_PROVIDER_SITE_OTHER): Payer: Medicare Other

## 2018-01-14 ENCOUNTER — Encounter: Payer: Self-pay | Admitting: Family Medicine

## 2018-01-14 ENCOUNTER — Ambulatory Visit: Payer: Medicare Other | Admitting: Family Medicine

## 2018-01-14 VITALS — BP 115/71 | HR 56 | Temp 97.2°F | Ht 68.0 in | Wt 189.0 lb

## 2018-01-14 DIAGNOSIS — Z780 Asymptomatic menopausal state: Secondary | ICD-10-CM

## 2018-01-14 DIAGNOSIS — E7800 Pure hypercholesterolemia, unspecified: Secondary | ICD-10-CM

## 2018-01-14 DIAGNOSIS — I10 Essential (primary) hypertension: Secondary | ICD-10-CM | POA: Diagnosis not present

## 2018-01-14 DIAGNOSIS — Z13820 Encounter for screening for osteoporosis: Secondary | ICD-10-CM | POA: Diagnosis not present

## 2018-01-14 DIAGNOSIS — F419 Anxiety disorder, unspecified: Secondary | ICD-10-CM | POA: Diagnosis not present

## 2018-01-14 DIAGNOSIS — E559 Vitamin D deficiency, unspecified: Secondary | ICD-10-CM | POA: Diagnosis not present

## 2018-01-14 DIAGNOSIS — I350 Nonrheumatic aortic (valve) stenosis: Secondary | ICD-10-CM

## 2018-01-14 DIAGNOSIS — I2510 Atherosclerotic heart disease of native coronary artery without angina pectoris: Secondary | ICD-10-CM | POA: Diagnosis not present

## 2018-01-14 DIAGNOSIS — M79671 Pain in right foot: Secondary | ICD-10-CM | POA: Diagnosis not present

## 2018-01-14 DIAGNOSIS — K219 Gastro-esophageal reflux disease without esophagitis: Secondary | ICD-10-CM

## 2018-01-14 DIAGNOSIS — I700 Atherosclerosis of aorta: Secondary | ICD-10-CM | POA: Diagnosis not present

## 2018-01-14 NOTE — Addendum Note (Signed)
Addended byFaylene Million C on: 01/14/2018 11:59 AM   Modules accepted: Orders

## 2018-01-14 NOTE — Patient Instructions (Addendum)
Medicare Annual Wellness Visit  [facility] and the medical providers at [facility] strive to bring you the best medical care.  In doing so we not only want to address your current medical conditions and concerns but also to detect new conditions early and prevent illness, disease and health-related problems.    Medicare offers a yearly Wellness Visit which allows our clinical staff to assess your need for preventative services including immunizations, lifestyle education, counseling to decrease risk of preventable diseases and screening for fall risk and other medical concerns.    This visit is provided free of charge (no copay) for all Medicare recipients. The clinical pharmacists at [facility] have begun to conduct these Wellness Visits which will also include a thorough review of all your medications.    As you primary medical provider recommend that you make an appointment for your Annual Wellness Visit if you have not done so already this year.  You may set up this appointment before you leave today or you may call back ([phone]) and schedule an appointment.  Please make sure when you call that you mention that you are scheduling your Annual Wellness Visit with the clinical pharmacist so that the appointment may be made for the proper length of time.     Continue current medications. Continue good therapeutic lifestyle changes which include good diet and exercise. Fall precautions discussed with patient. If an FOBT was given today- please return it to our front desk. If you are over 84 years old - you may need Prevnar 24 or the adult Pneumonia vaccine.  **Flu shots are available--- please call and schedule a FLU-CLINIC appointment**  After your visit with [place] today you will receive a survey in the mail or online from [employer] regarding your care with [place]. Please take a moment to fill this out. Your feedback is very  important to [place] as you can help [place] better understand your patient needs as well as improve your experience and satisfaction. WE CARE ABOUT YOU!!!   We will call with results of x-rays as soon as they become available Discuss past MRI results with neurologist at next visit We may do a referral back to Dr. Durward Fortes depending on the foot x-rays. Take Tylenol and use warm wet compresses and soaks for the foot. Continue to practice good allergy avoidance habits and continue regular allergy medicines.

## 2018-01-14 NOTE — Progress Notes (Signed)
Subjective:    Patient ID: Shelly Bennett, female    DOB: 12/24/1950, 67 y.o.   MRN: [id number]  HPI Pt here for follow up and management of chronic medical problems which includes hypertension and hyperlipidemia. She is taking medication regularly.  The patient is doing well overall.  She had a recent MRI by the neurologist.  This will be reviewed with her during the visit.  The MRI of the brain demonstrated some biparietal atrophy and ventriculomegaly of the lateral and third ventricles.  A few scattered periventricular and subcortical foci were present of nonspecific gliosis.  No acute findings.  The patient does complain of some swelling in her right foot and pain in her right foot and left thigh pain.  She did have an MRI of her LS spine in December 2016.  It showed some bulging disc at several levels and some mild spinal stenosis at L2 and 3 with minimal lateral recess narrowing at L2 and 3.  The MRI and MRI of her back were reviewed with the patient today she was given copies of both of these reports.  I tried to reassure her regarding the MRI of her head.  Try to have her focus on trying to have less stress and anxiety in her life.  She is concerned about her left thigh discomfort and her right foot pain.  She does have a lot of degenerative changes and some mild spinal stenosis at L3 and 4 level with bulging disc at several levels.  In the meantime while we get the x-rays of her foot we will encourage her to take Tylenol for pain and try to stay active physically.  She denies any chest pain other than occasional tightness that she attributes to anxiety.  She has seen the cardiologist and does see him regularly.  She has no shortness of breath.  Her stomach issues and stomach pain are less and her bowels are moving regularly.  She is up-to-date on her colonoscopies.  She is passing her water without problems.  She still sees Dr. Burt Knack cardiology wise.  She has a return appointment with a  neurologist soon.    Patient Active Problem List   Diagnosis Date Noted  . Mild cognitive impairment 10/22/2017  . Anxiety 10/22/2017  . Otitis of left ear 02/01/2017  . Aortic atherosclerosis ([place]) 12/04/2016  . Allergic drug reaction 06/25/2015  . Dehydration with hyponatremia 06/25/2015  . HCAP (healthcare-associated pneumonia) 06/25/2015  . Vitamin D deficiency 08/19/2014  . Family history of colon cancer-father at 101+ grandparent w/ rectal cancer 01/14/2014  . Osteopenia 08/12/2013  . CAD (coronary artery disease), native coronary artery 03/22/2013  . Aortic valve stenosis, mild 03/22/2013  . Hyperlipemia 03/11/2013  . Hypertension 03/11/2013  . Allergic rhinitis 03/11/2013  . Left shoulder pain 03/11/2013  . IBS (irritable bowel syndrome) - with chronic recurrent abdominal pain 02/01/2012  . Personal history of colonic polyps - adenoma 02/01/2012  . Coccydynia 02/01/2012   Outpatient Encounter Medications as of 01/14/2018  Medication Sig  . albuterol (PROVENTIL HFA;VENTOLIN HFA) 108 (90 Base) MCG/ACT inhaler Inhale 2 puffs into the lungs every 6 (six) hours as needed for wheezing or shortness of breath.  Marland Kitchen amLODipine (NORVASC) 10 MG tablet Take 0.5 tablets (5 mg total) by mouth daily.  Marland Kitchen aspirin 81 MG tablet Take 81 mg by mouth daily.   . calcium carbonate 200 MG capsule Take 250 mg by mouth 2 (two) times daily.   . Cholecalciferol (VITAMIN D-3) 5000  UNITS TABS Take 1 capsule by mouth as directed. Take one capsule by mouth daily Mon thru Friday  . fexofenadine (ALLEGRA) 180 MG tablet Take 180 mg by mouth daily.  . fluticasone (FLONASE) 50 MCG/ACT nasal spray Place 2 sprays into both nostrils daily.  . fluticasone furoate-vilanterol (BREO ELLIPTA) 100-25 MCG/INH AEPB Inhale 1 puff into the lungs daily as needed (wheezing).  . hydrochlorothiazide (HYDRODIURIL) 25 MG tablet Take 0.5 tablets (12.5 mg total) by mouth daily.  Marland Kitchen HYDROcodone-acetaminophen (NORCO/VICODIN) 5-325 MG  per tablet Take 0.5 tablets by mouth at bedtime as needed for moderate pain.   . mometasone (NASONEX) 50 MCG/ACT nasal spray 2 SPRAYS IN EACH NOSTRIL ONCE A DAY  . omega-3 acid ethyl esters (LOVAZA) 1 g capsule TAKE (1) CAPSULE FOUR TIMES DAILY.  . rosuvastatin (CRESTOR) 40 MG tablet Take 1 tablet (40 mg total) by mouth daily.  . busPIRone (BUSPAR) 15 MG tablet Take 1 tablet (15 mg total) by mouth 2 (two) times daily as needed. (Patient not taking: Reported on 01/14/2018)  . ranitidine (ZANTAC) 150 MG tablet Take 150 mg by mouth at bedtime.  . [DISCONTINUED] PARoxetine (PAXIL) 20 MG tablet Take 1 tablet (20 mg total) by mouth at bedtime.   No facility-administered encounter medications on file as of 01/14/2018.       Review of Systems  Constitutional: Negative.   HENT: Negative.   Eyes: Negative.   Respiratory: Negative.   Cardiovascular: Negative.   Gastrointestinal: Negative.   Endocrine: Negative.   Genitourinary: Negative.   Musculoskeletal: Positive for arthralgias (right foot pain -ongoing / swelling) and myalgias (DR Ernestina Patches - left thigh pain ).  Skin: Negative.   Allergic/Immunologic: Negative.   Neurological: Negative.   Hematological: Negative.   Psychiatric/Behavioral: Negative.        Objective:   Physical Exam  Constitutional: She is oriented to person, place, and time. She appears well-developed and well-nourished. No distress.  Is pleasant and alert and somewhat stressed with her children worrying about her memory and the right foot pain that she is having in the left thigh pain that she is having.  HENT:  Head: Normocephalic and atraumatic.  Right Ear: External ear normal.  Left Ear: External ear normal.  Mouth/Throat: Oropharynx is clear and moist. No oropharyngeal exudate.  Nasal congestion and erythema bilaterally  Eyes: Conjunctivae and EOM are normal. Pupils are equal, round, and reactive to light. Right eye exhibits no discharge. Left eye exhibits no  discharge. No scleral icterus.  Neck: Normal range of motion. Neck supple. No thyromegaly present.  No bruits thyromegaly or anterior cervical adenopathy  Cardiovascular: Normal rate, regular rhythm and intact distal pulses.  Murmur heard. Heart has a regular rate and rhythm at 60/min with a grade 2/6 systolic ejection murmur  Pulmonary/Chest: Effort normal and breath sounds normal. No respiratory distress. She has no wheezes. She has no rales.  Lungs are clear anteriorly and posteriorly  Abdominal: Soft. Bowel sounds are normal. She exhibits no mass. There is no tenderness. There is no rebound and no guarding.  No liver or spleen enlargement no epigastric tenderness no bruits no inguinal adenopathy and no masses  Musculoskeletal: She exhibits tenderness. She exhibits no edema.  Limited mange of motion especially with standing and walking because of left hip discomfort and right foot pain  Lymphadenopathy:    She has no cervical adenopathy.  Neurological: She is alert and oriented to person, place, and time. She displays abnormal reflex.  Lower extremity reflexes are less  on the right than the left.  Skin: Skin is warm and dry. No rash noted.  Psychiatric: She has a normal mood and affect. Her behavior is normal. Judgment and thought content normal.  Nursing note and vitals reviewed.  BP 115/71 (BP Location: Left Arm)   Pulse (!) 56   Temp (!) 97.2 F (36.2 C) (Oral)   Ht '5\' 8"'$  (1.727 m)   Wt 189 lb (85.7 kg)   BMI 28.74 kg/m         Assessment & Plan:  1. Pure hypercholesterolemia -Continue current treatment and as aggressive therapeutic lifestyle changes as possible pending results of lab work - CBC with Differential/Platelet - Lipid panel  2. Hypertension -The blood pressure is good today and she will continue with current treatment - BMP8+EGFR - CBC with Differential/Platelet - Hepatic function panel  3. Arteriosclerotic cardiovascular disease (ASCVD) -Continue  aggressive therapeutic lifestyle changes and current statin therapy pending results of lab work - CBC with Differential/Platelet  4. Vitamin D deficiency -Continue calcium and vitamin D replacement pending results of lab work - CBC with Differential/Platelet - VITAMIN D 25 Hydroxy (Vit-D Deficiency, Fractures) - DG [facility] DEXA; Future  5. Gastroesophageal reflux disease, esophagitis presence not specified -No complaints today with reflux.  Patient will continue with her ranitidine and diet of avoidance - CBC with Differential/Platelet  6. Screening for osteoporosis - CBC with Differential/Platelet - DG [facility] DEXA; Future  7. Postmenopausal - CBC with Differential/Platelet - DG [facility] DEXA; Future  8. Right foot pain -Patient has ongoing foot pain and does give a history of an old injury in the past.  There is no redness or specific tenderness.  We will make sure we get a uric acid with her blood work today. - DG Foot Complete Right; Future  9. Aortic atherosclerosis ([place]) -Continue aggressive therapeutic lifestyle changes  10. Anxiety -Try to stress last and only do what you are able to do as stress and anxiety do impact memory.  11. Aortic valve stenosis, mild -Continue follow-up with Dr. Burt Knack, the cardiologist   Patient Instructions                       Medicare Annual Wellness Visit  [facility] and the medical providers at [facility] strive to bring you the best medical care.  In doing so we not only want to address your current medical conditions and concerns but also to detect new conditions early and prevent illness, disease and health-related problems.    Medicare offers a yearly Wellness Visit which allows our clinical staff to assess your need for preventative services including immunizations, lifestyle education, counseling to decrease risk of preventable diseases and screening for fall risk and other medical concerns.    This visit is  provided free of charge (no copay) for all Medicare recipients. The clinical pharmacists at [facility] have begun to conduct these Wellness Visits which will also include a thorough review of all your medications.    As you primary medical provider recommend that you make an appointment for your Annual Wellness Visit if you have not done so already this year.  You may set up this appointment before you leave today or you may call back ([phone]) and schedule an appointment.  Please make sure when you call that you mention that you are scheduling your Annual Wellness Visit with the clinical pharmacist so that the appointment may be made for the proper length  of time.     Continue current medications. Continue good therapeutic lifestyle changes which include good diet and exercise. Fall precautions discussed with patient. If an FOBT was given today- please return it to our front desk. If you are over 35 years old - you may need Prevnar 60 or the adult Pneumonia vaccine.  **Flu shots are available--- please call and schedule a FLU-CLINIC appointment**  After your visit with [place] today you will receive a survey in the mail or online from [employer] regarding your care with [place]. Please take a moment to fill this out. Your feedback is very important to [place] as you can help [place] better understand your patient needs as well as improve your experience and satisfaction. WE CARE ABOUT YOU!!!   We will call with results of x-rays as soon as they become available Discuss past MRI results with neurologist at next visit We may do a referral back to Dr. Durward Fortes depending on the foot x-rays. Take Tylenol and use warm wet compresses and soaks for the foot. Continue to practice good allergy avoidance habits and continue regular allergy medicines.   Arrie Senate MD

## 2018-01-15 LAB — BMP8+EGFR
BUN/Creatinine Ratio: 17 (ref 12–28)
BUN: 12 mg/dL (ref 8–27)
CHLORIDE: 101 mmol/L (ref 96–106)
CO2: 26 mmol/L (ref 20–29)
Calcium: 10.6 mg/dL — ABNORMAL HIGH (ref 8.7–10.3)
Creatinine, Ser: 0.69 mg/dL (ref 0.57–1.00)
GFR calc Af Amer: 104 mL/min/{1.73_m2} (ref >59)
GFR calc non Af Amer: 90 mL/min/{1.73_m2} (ref >59)
Glucose: 86 mg/dL (ref 65–99)
POTASSIUM: 4 mmol/L (ref 3.5–5.2)
Sodium: 143 mmol/L (ref 134–144)

## 2018-01-15 LAB — CBC WITH DIFFERENTIAL/PLATELET
BASOS ABS: 0 10*3/uL (ref 0.0–0.2)
Basos: 0 %
EOS (ABSOLUTE): 0.1 10*3/uL (ref 0.0–0.4)
Eos: 2 %
Hematocrit: 41.3 % (ref 34.0–46.6)
Hemoglobin: 13.9 g/dL (ref 11.1–15.9)
IMMATURE GRANS (ABS): 0 10*3/uL (ref 0.0–0.1)
IMMATURE GRANULOCYTES: 1 %
Lymphocytes Absolute: 1.6 10*3/uL (ref 0.7–3.1)
Lymphs: 25 %
MCH: 30.7 pg (ref 26.6–33.0)
MCHC: 33.7 g/dL (ref 31.5–35.7)
MCV: 91 fL (ref 79–97)
Monocytes Absolute: 0.4 10*3/uL (ref 0.1–0.9)
Monocytes: 6 %
NEUTROS PCT: 66 %
Neutrophils Absolute: 4.3 10*3/uL (ref 1.4–7.0)
Platelets: 287 10*3/uL (ref 150–379)
RBC: 4.53 x10E6/uL (ref 3.77–5.28)
RDW: 13.8 % (ref 12.3–15.4)
WBC: 6.3 10*3/uL (ref 3.4–10.8)

## 2018-01-15 LAB — HEPATIC FUNCTION PANEL
ALT: 19 IU/L (ref 0–32)
AST: 19 IU/L (ref 0–40)
Albumin: 4.5 g/dL (ref 3.6–4.8)
Alkaline Phosphatase: 75 IU/L (ref 39–117)
BILIRUBIN, DIRECT: 0.16 mg/dL (ref 0.00–0.40)
Bilirubin Total: 0.6 mg/dL (ref 0.0–1.2)
Total Protein: 6.9 g/dL (ref 6.0–8.5)

## 2018-01-15 LAB — LIPID PANEL
CHOLESTEROL TOTAL: 159 mg/dL (ref 100–199)
Chol/HDL Ratio: 2.3 ratio (ref 0.0–4.4)
HDL: 68 mg/dL (ref >39)
LDL Calculated: 73 mg/dL (ref 0–99)
Triglycerides: 89 mg/dL (ref 0–149)
VLDL CHOLESTEROL CAL: 18 mg/dL (ref 5–40)

## 2018-01-15 LAB — VITAMIN D 25 HYDROXY (VIT D DEFICIENCY, FRACTURES): Vit D, 25-Hydroxy: 68.2 ng/mL (ref 30.0–100.0)

## 2018-01-15 LAB — URIC ACID: URIC ACID: 5.4 mg/dL (ref 2.5–7.1)

## 2018-01-22 ENCOUNTER — Ambulatory Visit: Payer: Medicare Other | Admitting: Neurology

## 2018-01-22 ENCOUNTER — Encounter: Payer: Self-pay | Admitting: Neurology

## 2018-01-22 VITALS — BP 130/69 | HR 55 | Ht 68.0 in | Wt 193.0 lb

## 2018-01-22 DIAGNOSIS — M5416 Radiculopathy, lumbar region: Secondary | ICD-10-CM | POA: Insufficient documentation

## 2018-01-22 DIAGNOSIS — G3184 Mild cognitive impairment, so stated: Secondary | ICD-10-CM

## 2018-01-22 MED ORDER — DULOXETINE HCL 60 MG PO CPEP: 60.0000 mg | ORAL_CAPSULE | Freq: Every day | ORAL | 12 refills | Status: DC

## 2018-01-22 NOTE — Progress Notes (Signed)
PATIENT: Shelly Bennett DOB: 1951/04/25  Chief Complaint  Patient presents with  . Follow-up    anxiety & memory problems; She is here alone. She reports her memory has been better. She is aware of her MRI results and has the report. She also has pain in her L buttock/hip area and has an MRI report of her lumbar spine for MD review. She reports her anxiety is better. She takes Buspar as needed. MMSE 28/30, Animals 12.      HISTORICAL  Shelly Bennett is a 67 years old female, seen in refer by her primary care doctor Redge Gainer for evaluation of anxiety, memory loss, initial evaluation was on November 6th 2018.   I have reviewed and summarized the referring note, she has history of hypertension, coronary artery disease, hyperlipidemia, is a retired Automotive engineer. She drove herself to clinic today.  She reported stress, she is the main caretaker of her mother that is 32 years old, since 2017, she was noted to repeat herself, forget peoples name, displaced pains,  Since 2018, she also suffered depression anxiety, because of the strained relationship, she is taking BuSpar, and Paxil since August 2018 which seems to help her some,  She has mild gait abnormality due to previous left knee injury.   Laboratory evaluations, B12 474, normal liver functional tests, vitamin D level 57, lipid profile LDL 82, total cholesterol 169,  UPDATE Feb 6th 2019: She continue has mild memory loss, Mini-Mental Status Examination 28/30, MRI of the brain showed moderate generalized atrophy, ventriculomegaly mild supratentorium small vessel disease  She also complains of chronic left low back pain radiating pain to left hip, I have personally reviewed MRI lumbar in 2016, multilevel degenerative changes, most severe at L3-4, with moderate spinal stenosis, bilateral lateral recess stenosis.  REVIEW OF SYSTEMS: Full 14 system review of systems performed and notable only for aching  muscles, walking difficulty, murmur  ALLERGIES: Allergies  Allergen Reactions  . Codeine     ? Reaction ER visit.   . Mold Extract [Trichophyton]     Migraine  . Penicillins Other (See Comments)    "drew my legs up as child" Pt has tolerated cephalexin in the past.  . Ace Inhibitors Cough  . Angiotensin Receptor Blockers Cough  . Levaquin [Levofloxacin] Rash    Per notes from outpatient provider  . Vytorin [Ezetimibe-Simvastatin] Other (See Comments)    cramping  . Zetia [Ezetimibe] Other (See Comments)    cramping    HOME MEDICATIONS: Current Outpatient Medications  Medication Sig Dispense Refill  . albuterol (PROVENTIL HFA;VENTOLIN HFA) 108 (90 Base) MCG/ACT inhaler Inhale 2 puffs into the lungs every 6 (six) hours as needed for wheezing or shortness of breath. 1 Inhaler 11  . amLODipine (NORVASC) 10 MG tablet Take 0.5 tablets (5 mg total) by mouth daily. 45 tablet 3  . aspirin 81 MG tablet Take 81 mg by mouth daily.     . busPIRone (BUSPAR) 15 MG tablet Take 1 tablet (15 mg total) by mouth 2 (two) times daily as needed. 60 tablet 1  . calcium carbonate 200 MG capsule Take 250 mg by mouth 2 (two) times daily.     . Cholecalciferol (VITAMIN D-3) 5000 UNITS TABS Take 1 capsule by mouth as directed. Take one capsule by mouth daily Mon thru Friday    . fexofenadine (ALLEGRA) 180 MG tablet Take 180 mg by mouth daily.    . fluticasone (FLONASE) 50 MCG/ACT nasal spray Place  2 sprays into both nostrils daily.    . fluticasone furoate-vilanterol (BREO ELLIPTA) 100-25 MCG/INH AEPB Inhale 1 puff into the lungs daily as needed (wheezing).    . hydrochlorothiazide (HYDRODIURIL) 25 MG tablet Take 0.5 tablets (12.5 mg total) by mouth daily. 45 tablet 3  . HYDROcodone-acetaminophen (NORCO/VICODIN) 5-325 MG per tablet Take 0.5 tablets by mouth at bedtime as needed for moderate pain.     . mometasone (NASONEX) 50 MCG/ACT nasal spray 2 SPRAYS IN EACH NOSTRIL ONCE A DAY 17 g 4  . omega-3 acid ethyl  esters (LOVAZA) 1 g capsule TAKE (1) CAPSULE FOUR TIMES DAILY. 360 capsule 0  . ranitidine (ZANTAC) 150 MG tablet Take 150 mg by mouth at bedtime.    . rosuvastatin (CRESTOR) 40 MG tablet Take 1 tablet (40 mg total) by mouth daily. 90 tablet 3   No current facility-administered medications for this visit.     PAST MEDICAL HISTORY: Past Medical History:  Diagnosis Date  . Adenomatous colon polyp   . Allergy   . Anxiety   . Arthritis   . CAD (coronary artery disease)    Dr. Burt Knack follows   . Chronic headaches   . Cystocele   . Diverticulosis   . External hemorrhoids   . HLD (hyperlipidemia)   . HTN (hypertension)   . IBS (irritable bowel syndrome)   . Memory loss   . Obesity   . Pneumonia   . Skin cancer    squamous cell - face    PAST SURGICAL HISTORY: Past Surgical History:  Procedure Laterality Date  . CARPAL TUNNEL RELEASE    . COLONOSCOPY  12/15/2008   diverticulosis, external hemorrhoids  . KNEE SURGERY  05/22/2015   right  . LAPAROSCOPY    . SKIN SURGERY     squamous cell - right face     FAMILY HISTORY: Family History  Problem Relation Age of Onset  . Prostate cancer Father   . Colon cancer Father 26  . Kidney disease Father   . Heart disease Father   . Alzheimer's disease Father   . Hyperlipidemia Mother   . Hypertension Mother   . Kidney disease Mother   . Osteoporosis Mother   . Heart murmur Mother   . Dementia Mother   . Hyperlipidemia Sister   . Hypertension Sister   . Heart disease Brother 33       not dx questionable   . Heart disease Maternal Grandfather     SOCIAL HISTORY:  Social History   Socioeconomic History  . Marital status: Married    Spouse name: [name]  . Number of children: 4  . Years of education: 16  . Highest education level: Bachelor's degree (e.g., BA, AB, BS)  Social Needs  . Financial resource strain: Not on file  . Food insecurity - worry: Not on file  . Food insecurity - inability: Not on file  .  Transportation needs - medical: Not on file  . Transportation needs - non-medical: Not on file  Occupational History  . Occupation: Pharmacist, hospital retired  Tobacco Use  . Smoking status: Never Smoker  . Smokeless tobacco: Never Used  Substance and Sexual Activity  . Alcohol use: No  . Drug use: No  . Sexual activity: Not Currently    Birth control/protection: None  Other Topics Concern  . Not on file  Social History Narrative   Lives at home with her husband.   Right-handed.   One cup coffee each morning and one  soda each day.     PHYSICAL EXAM   Vitals:   01/22/18 0937  BP: 130/69  Pulse: (!) 55  Weight: 193 lb (87.5 kg)  Height: 5\' 8"  (1.727 m)    Not recorded      Body mass index is 29.35 kg/m.  PHYSICAL EXAMNIATION:  Gen: NAD, conversant, well nourised, obese, well groomed                     Cardiovascular: Regular rate rhythm, no peripheral edema, warm, nontender. Eyes: Conjunctivae clear without exudates or hemorrhage Neck: Supple, no carotid bruits. Pulmonary: Clear to auscultation bilaterally   NEUROLOGICAL EXAM:  MENTAL STATUS: MMSE - Mini Mental State Exam 01/22/2018 10/22/2017 03/22/2016  Orientation to time 5 5 5   Orientation to Place 5 5 5   Registration 3 3 3   Attention/ Calculation 3 5 5   Recall 3 2 3   Language- name 2 objects 2 2 2   Language- repeat 1 1 1   Language- follow 3 step command 3 3 3   Language- read & follow direction 1 1 1   Write a sentence 1 1 1   Copy design 1 1 1   Total score 28 29 30   animal naming 12   CRANIAL NERVES: CN II: Visual fields are full to confrontation. Fundoscopic exam is normal with sharp discs and no vascular changes. Pupils are round equal and briskly reactive to light. CN III, IV, VI: extraocular movement are normal. No ptosis. CN V: Facial sensation is intact to pinprick in all 3 divisions bilaterally. Corneal responses are intact.  CN VII: Face is symmetric with normal eye closure and smile. CN VIII: Hearing is  normal to rubbing fingers CN IX, X: Palate elevates symmetrically. Phonation is normal. CN XI: Head turning and shoulder shrug are intact CN XII: Tongue is midline with normal movements and no atrophy.  MOTOR: There is no pronator drift of out-stretched arms. Muscle bulk and tone are normal. Muscle strength is normal.  REFLEXES: Reflexes are 2+ and symmetric at the biceps, triceps, knees, and ankles. Plantar responses are flexor.  SENSORY: Intact to light touch, pinprick, positional sensation and vibratory sensation are intact in fingers and toes.  COORDINATION: Rapid alternating movements and fine finger movements are intact. There is no dysmetria on finger-to-nose and heel-knee-shin.    GAIT/STANCE: Need to push up to get up from seated position, mildly antalgic, cautious.   DIAGNOSTIC DATA (LABS, IMAGING, TESTING) - I reviewed patient records, labs, notes, testing and imaging myself where available.   ASSESSMENT AND PLAN  Shelly Bennett is a 66 y.o. female   Mild cognitive Impairment:  MMSE 28 /30.  MRI brain in November 2018 showed generalized atrophy, ventriculomegaly, mild supratentorium small vessel disease..  Laboratory evaluation showed normal TSH, B12  Depression/Anxiety:  Continue Buspar, Paxil  Left lumbar radiculopathy  Refer her to physical therapy    Marcial Pacas, M.D. Ph.D.  [facility] [address], [place], [place] [address] Ph: [phone] Fax: [phone]  CC: Referring Provider

## 2018-01-28 ENCOUNTER — Ambulatory Visit: Payer: Medicare Other | Attending: Neurology | Admitting: Physical Therapy

## 2018-01-28 ENCOUNTER — Other Ambulatory Visit: Payer: Self-pay

## 2018-01-28 ENCOUNTER — Encounter: Payer: Self-pay | Admitting: Physical Therapy

## 2018-01-28 DIAGNOSIS — M6281 Muscle weakness (generalized): Secondary | ICD-10-CM | POA: Diagnosis present

## 2018-01-28 DIAGNOSIS — G8929 Other chronic pain: Secondary | ICD-10-CM | POA: Diagnosis present

## 2018-01-28 DIAGNOSIS — M545 Low back pain: Secondary | ICD-10-CM | POA: Insufficient documentation

## 2018-01-28 NOTE — Therapy (Signed)
[facility] [place], [place], [address] Phone: [phone]   Fax:  [phone]  Physical Therapy Evaluation  Patient Details  Name: Shelly Bennett MRN: [id number] Date of Birth: Apr 04, 1951 Referring Provider: Marcial Pacas MD.   Encounter Date: 01/28/2018  PT End of Session - 01/28/18 1033    Visit Number  1    Number of Visits  16    Date for PT Re-Evaluation  04/28/18    Authorization Type  FOTO AT LEAST EVERY 5TH VISIT.    PT Start Time  [phone]    PT Stop Time  1031    PT Time Calculation (min)  54 min    Activity Tolerance  Patient tolerated treatment well;Treatment limited secondary to medical complications (Comment)    Behavior During Therapy  [facility] for tasks assessed/performed       Past Medical History:  Diagnosis Date  . Adenomatous colon polyp   . Allergy   . Anxiety   . Arthritis   . CAD (coronary artery disease)    Dr. Burt Knack follows   . Chronic headaches   . Cystocele   . Diverticulosis   . External hemorrhoids   . HLD (hyperlipidemia)   . HTN (hypertension)   . IBS (irritable bowel syndrome)   . Memory loss   . Obesity   . Pneumonia   . Skin cancer    squamous cell - face    Past Surgical History:  Procedure Laterality Date  . CARPAL TUNNEL RELEASE    . COLONOSCOPY  12/15/2008   diverticulosis, external hemorrhoids  . KNEE SURGERY  05/22/2015   right  . LAPAROSCOPY    . SKIN SURGERY     squamous cell - right face     There were no vitals filed for this visit.   Subjective Assessment - 01/28/18 1057    Subjective  The patient presents to physical therapy with c/o left sided low back pain and a feeling of weakness in her left LE.  This has been ongoing and evolving over the last 2-3 years.  She has received 2 low back/left gluteal injections that were not real helpful.  She reports going up steps is very difficult and going from sit to stand is very painful.  She reports that she lacks  confidence due to this and is fearful her left leg will give way.  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  Her sleep is also disturbed by sleep.  Heat decreases her pain.      Pertinent History  DDD; spinal stenosis; osteopenia.      Limitations  Sitting;Standing    How long can you sit comfortably?  20 minutes.    How long can you stand comfortably?  10 minutes.    How long can you walk comfortably?  Short community distances.    Diagnostic tests  MRI:  multi-level degenerative changes most severe at L3-4 with moderate spinal stenosis.      Patient Stated Goals  I want to get out of pain and do things without fear.      Currently in Pain?  Yes    Pain Score  7     Pain Location  Back    Pain Orientation  Left    Pain Descriptors / Indicators  Aching;Sharp    Pain Type  Chronic pain    Pain Radiating Towards  Left LE.    Pain  Onset  More than a month ago    Pain Frequency  Constant    Aggravating Factors   See above.    Pain Relieving Factors  See above.         [facility] PT Assessment - 01/28/18 0001      Assessment   Medical Diagnosis  Left lumbar radiculopathy.    Referring Provider  Marcial Pacas MD.    Onset Date/Surgical Date  -- 2-3 years.      Precautions   Precautions  None      Balance Screen   Has the patient fallen in the past 6 months  No    Has the patient had a decrease in activity level because of a fear of falling?   Yes    Is the patient reluctant to leave their home because of a fear of falling?   Yes      [place] residence      Prior Function   Level of Independence  Independent      Observation/Other Assessments   Focus on Therapeutic Outcomes (FOTO)   73% limitation.      Posture/Postural Control   Posture Comments  The patient's postural is generally quite good.      ROM / Strength   AROM / PROM / Strength  AROM;Strength      AROM   Overall AROM Comments   Normal LE AROM.  Active lumbar extension= 25 degrees and flexion is nearly full.      Strength   Overall Strength Comments  Left hip abduction= 4- to 4/5; left knee extension and flexion is 4 to 4+/5 but is shaky with manual muscle testing.      Palpation   Palpation comment  Patient was very tender to palpation over left SIJ and she c/o pain equidistance between sacrum and greater trochanter in the gluteal musculature.      Special Tests    Special Tests  -- Bil Pat DTR's are normal.  Rt Ach is absent and LT is 1+/4+.    Other special tests  (+) LT SLR; Mildly positive left FABER test and equal leg lengths.      Bed Mobility   Bed Mobility  Rolling Right;Supine to Sit    Rolling Right  6: Modified independent (Device/Increase time)    Supine to Sit  6: Modified independent (Device/Increase time)      Transfers   Comments  Very painful going from sit to stand.      Ambulation/Gait   Gait Comments  Essentially normal.             Objective measurements completed on examination: See above findings.      [facility] Adult PT Treatment/Exercise - 01/28/18 0001      Modalities   Modalities  Electrical Stimulation;Moist Heat      Moist Heat Therapy   Number Minutes Moist Heat  20 Minutes    Moist Heat Location  Lumbar Spine      Electrical Stimulation   Electrical Stimulation Location  Left SIJ/upper gluteal region.    Electrical Stimulation Action  Pre-mod.    Electrical Stimulation Parameters  80-150 Hz x 20 minutes.    Electrical Stimulation Goals  Pain             PT Education - 01/28/18 1105    Education provided  Yes    Education Details  Piriformis stretch.  Sleeping posture.    Person(s) Educated  Patient    Methods  Explanation;Demonstration;Tactile cues;Verbal cues    Comprehension  Verbal cues required;Tactile cues required;Returned demonstration;Verbalized understanding;Need further instruction       PT Short Term Goals - 01/28/18 1237      PT  SHORT TERM GOAL #1   Title  STG's=LTG's.        PT Long Term Goals - 01/28/18 1237      PT LONG TERM GOAL #1   Title  Ind with HEP.    Time  4    Period  Weeks    Status  New      PT LONG TERM GOAL #2   Title  Stand 30 minutes with pain not > 3/10.    Time  8    Period  Weeks    Status  New      PT LONG TERM GOAL #3   Title  Sit 30 minutes with pain not > 3/10.    Time  8    Period  Weeks    Status  New      PT LONG TERM GOAL #4   Title  Sit to stand x 5 with pain not > 2-3/10.    Time  8    Period  Weeks    Status  New      PT LONG TERM GOAL #5   Title  Perform ADL's with pain not > 3/10.    Time  8    Period  Weeks    Status  New             Plan - 01/28/18 1229    Clinical Impression Statement  The patient presents to OPPT with c/o chronic low back pain with radiation of pain into her left LE.  Her pain has had a significant impact on her functional mobility and has a limited ability to sit and stand for prolonged periods of time.  She also has increased difficulty with navigating steps and transferring.  She does in fact have weakness in her left hip and knee.  She has pain with a left SLR as well.  She is very tender to palpation over her left SIJ.  Patient will benefit from skilled physical therapy to include soft tissue work and progress into a left LE and core strengthening exercise program.    History and Personal Factors relevant to plan of care:  Knee surgery; bilateral foot problems. DDD.    Clinical Presentation  Evolving    Clinical Presentation due to:  Not improving.    Clinical Decision Making  Moderate    Rehab Potential  Good    Clinical Impairments Affecting Rehab Potential  Left LE weakness.    PT Frequency  2x / week    PT Duration  8 weeks    PT Treatment/Interventions  ADLs/Self Care Home Management;Cryotherapy;Electrical Stimulation;Ultrasound;Moist Heat;Functional mobility training;Therapeutic activities;Therapeutic  exercise;Patient/family education;Manual techniques;Dry needling    PT Next Visit Plan  In right SDLY position with pillows between knees:  Combo e'stim/U/S to left SIJ region f/b STW/M.  Left LE strengthening.  Core exercise progression.    Consulted and Agree with Plan of Care  Patient       Patient will benefit from skilled therapeutic intervention in order to improve the following deficits and impairments:  Decreased activity tolerance, Decreased mobility, Decreased strength, Pain  Visit Diagnosis: Chronic left-sided low back pain, with sciatica presence unspecified - Plan: PT plan of care cert/re-cert  Muscle weakness (generalized) - Plan:  PT plan of care cert/re-cert     Problem List Patient Active Problem List   Diagnosis Date Noted  . Left lumbar radiculopathy 01/22/2018  . Mild cognitive impairment 10/22/2017  . Anxiety 10/22/2017  . Otitis of left ear 02/01/2017  . Aortic atherosclerosis ([place]) 12/04/2016  . Allergic drug reaction 06/25/2015  . Dehydration with hyponatremia 06/25/2015  . HCAP (healthcare-associated pneumonia) 06/25/2015  . Vitamin D deficiency 08/19/2014  . Family history of colon cancer-father at 52+ grandparent w/ rectal cancer 01/14/2014  . Osteopenia 08/12/2013  . CAD (coronary artery disease), native coronary artery 03/22/2013  . Aortic valve stenosis, mild 03/22/2013  . Hyperlipemia 03/11/2013  . Hypertension 03/11/2013  . Allergic rhinitis 03/11/2013  . Left shoulder pain 03/11/2013  . IBS (irritable bowel syndrome) - with chronic recurrent abdominal pain 02/01/2012  . Personal history of colonic polyps - adenoma 02/01/2012  . Coccydynia 02/01/2012    APP[place], [place] MPT 01/28/2018, 12:41 PM  [facility] [place], [place], [address] Phone: [phone]   Fax:  [phone]  Name: Shelly Bennett MRN: [id number] Date of Birth: 11/16/1951

## 2018-01-29 ENCOUNTER — Other Ambulatory Visit: Payer: Self-pay | Admitting: Family Medicine

## 2018-01-30 ENCOUNTER — Ambulatory Visit (INDEPENDENT_AMBULATORY_CARE_PROVIDER_SITE_OTHER): Payer: Medicare Other | Admitting: *Deleted

## 2018-01-30 ENCOUNTER — Ambulatory Visit: Payer: Medicare Other | Admitting: Physical Therapy

## 2018-01-30 ENCOUNTER — Telehealth: Payer: Self-pay | Admitting: *Deleted

## 2018-01-30 ENCOUNTER — Encounter: Payer: Self-pay | Admitting: Physical Therapy

## 2018-01-30 VITALS — BP 133/75 | HR 59 | Ht 68.0 in | Wt 192.2 lb

## 2018-01-30 DIAGNOSIS — G8929 Other chronic pain: Secondary | ICD-10-CM

## 2018-01-30 DIAGNOSIS — M545 Low back pain: Secondary | ICD-10-CM | POA: Diagnosis not present

## 2018-01-30 DIAGNOSIS — M6281 Muscle weakness (generalized): Secondary | ICD-10-CM

## 2018-01-30 DIAGNOSIS — Z0000 Encounter for general adult medical examination without abnormal findings: Secondary | ICD-10-CM

## 2018-01-30 NOTE — Progress Notes (Signed)
Subjective:   Shelly Bennett is a 67 y.o. female who presents for an Initial Medicare Annual Wellness Visit. Patient is here today for her initial medicare annual wellness visit. 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 She enjoys taking day trips, singing, spending time with her family, planting flowers, bird watching, and swimming. Patient states she is not exercising at this time due to foot and hip pain. Patient is currently receiving physical therapy. Patient does enjoy swimming when it is warm outside. Patient states her diet is semi-healthy. Patient attends [employer] where she is a active member. She lives in [place] with her husband. She has 4 adult children and 5 grandchildren. She does not have any pets or stairs in the home. She does have some rugs in the home but she states they all have furniture sitting on them. Fall hazards were discussed with patient. Patient states that her health is the same as it was this time last year. Patient has not had any hospitalizations or surgeries in the last year.   Review of Systems      Cardiac Risk Factors include: advanced age (>45men, >63 women);hypertension     Objective:    Today's Vitals   01/30/18 1040  BP: 133/75  Pulse: (!) 59  Weight: 192 lb 3.2 oz (87.2 kg)  Height: 5\' 8"  (1.727 m)   Body mass index is 29.22 kg/m.  Advanced Directives 01/30/2018 01/28/2018 01/02/2016 06/25/2015 06/22/2015 06/06/2015  Does Patient Have a Medical Advance Directive? No No No No No No  Would patient like information on creating a medical advance directive? Yes (MAU/Ambulatory/Procedural Areas - Information given) - No - patient declined information No - patient declined information - -    Current Medications (verified) Outpatient Encounter Medications as of 01/30/2018  Medication Sig  . albuterol (PROVENTIL HFA;VENTOLIN HFA) 108 (90 Base) MCG/ACT inhaler Inhale 2 puffs into the lungs every 6 (six)  hours as needed for wheezing or shortness of breath.  Marland Kitchen amLODipine (NORVASC) 10 MG tablet Take 0.5 tablets (5 mg total) by mouth daily.  Marland Kitchen aspirin 81 MG tablet Take 81 mg by mouth daily.   . busPIRone (BUSPAR) 15 MG tablet Take 1 tablet (15 mg total) by mouth 2 (two) times daily as needed.  . calcium carbonate 200 MG capsule Take 250 mg by mouth 2 (two) times daily.   . Cholecalciferol (VITAMIN D-3) 5000 UNITS TABS Take 1 capsule by mouth as directed. Take one capsule by mouth daily Mon thru Friday  . DULoxetine (CYMBALTA) 60 MG capsule Take 1 capsule (60 mg total) by mouth daily.  . fexofenadine (ALLEGRA) 180 MG tablet Take 180 mg by mouth daily.  . fluticasone (FLONASE) 50 MCG/ACT nasal spray Place 2 sprays into both nostrils daily.  . fluticasone furoate-vilanterol (BREO ELLIPTA) 100-25 MCG/INH AEPB Inhale 1 puff into the lungs daily as needed (wheezing).  . hydrochlorothiazide (HYDRODIURIL) 25 MG tablet Take 0.5 tablets (12.5 mg total) by mouth daily.  Marland Kitchen HYDROcodone-acetaminophen (NORCO/VICODIN) 5-325 MG per tablet Take 0.5 tablets by mouth at bedtime as needed for moderate pain.   . mometasone (NASONEX) 50 MCG/ACT nasal spray 2 SPRAYS IN EACH NOSTRIL ONCE A DAY  . omega-3 acid ethyl esters (LOVAZA) 1 g capsule TAKE (1) CAPSULE FOUR TIMES DAILY.  . ranitidine (ZANTAC) 150 MG tablet Take 150 mg by mouth at bedtime.  . rosuvastatin (CRESTOR) 40 MG tablet Take 1 tablet (40 mg total) by mouth daily.   No  facility-administered encounter medications on file as of 01/30/2018.     Allergies (verified) Codeine; Mold extract [trichophyton]; Penicillins; Ace inhibitors; Angiotensin receptor blockers; Levaquin [levofloxacin]; Vytorin [ezetimibe-simvastatin]; and Zetia [ezetimibe]   History: Past Medical History:  Diagnosis Date  . Adenomatous colon polyp   . Allergy   . Anxiety   . Arthritis   . CAD (coronary artery disease)    Dr. Burt Knack follows   . Chronic headaches   . Cystocele   .  Diverticulosis   . External hemorrhoids   . HLD (hyperlipidemia)   . HTN (hypertension)   . IBS (irritable bowel syndrome)   . Memory loss   . Obesity   . Pneumonia   . Skin cancer    squamous cell - face   Past Surgical History:  Procedure Laterality Date  . CARPAL TUNNEL RELEASE    . COLONOSCOPY  12/15/2008   diverticulosis, external hemorrhoids  . KNEE SURGERY  05/22/2015   right  . LAPAROSCOPY    . SKIN SURGERY     squamous cell - right face    Family History  Problem Relation Age of Onset  . Prostate cancer Father   . Colon cancer Father 46  . Kidney disease Father   . Heart disease Father   . Alzheimer's disease Father   . Hyperlipidemia Mother   . Hypertension Mother   . Kidney disease Mother   . Osteoporosis Mother   . Heart murmur Mother   . Dementia Mother   . Hyperlipidemia Sister   . Hypertension Sister   . Heart disease Brother 39       not dx questionable   . Heart disease Maternal Grandfather    Social History   Socioeconomic History  . Marital status: Married    Spouse name: [name]  . Number of children: 4  . Years of education: 16  . Highest education level: Bachelor's degree (e.g., BA, AB, BS)  Social Needs  . Financial resource strain: Not hard at all  . Food insecurity - worry: Never true  . Food insecurity - inability: Never true  . Transportation needs - medical: No  . Transportation needs - non-medical: No  Occupational History  . Occupation: Pharmacist, hospital retired  Tobacco Use  . Smoking status: Never Smoker  . Smokeless tobacco: Never Used  Substance and Sexual Activity  . Alcohol use: No  . Drug use: No  . Sexual activity: Not Currently    Birth control/protection: None  Other Topics Concern  . None  Social History Narrative   Lives at home with her husband.   Right-handed.   One cup coffee each morning and one soda each day.    Tobacco Counseling Counseling given: Not Answered Patient does not use tobacco.  Clinical  Intake:   Activities of Daily Living In your present state of health, do you have any difficulty performing the following activities: 01/30/2018  Hearing? N  Vision? Y  Comment Due for eye exam in April   Difficulty concentrating or making decisions? Y  Comment Repeating self and trouble remebering things   Walking or climbing stairs? Y  Comment foot and hip pain. Currently taking Physical therapy   Dressing or bathing? N  Doing errands, shopping? N  Preparing Food and eating ? N  Using the Toilet? N  In the past six months, have you accidently leaked urine? Y  Comment all the time- Patient has prolapsed bladder   Do you have problems with loss of bowel control?  N  Managing your Medications? N  Managing your Finances? N  Housekeeping or managing your Housekeeping? N  Some recent data might be hidden   Patient is due for a eye exam in April Patient states that at times she does catch her self repeating what she has already said. Patient also states that she has trouble remembering things at times she does take care of her 60 yo mother.  Patient has trouble walking and climbing stairs due to foot and hip pain. Patient is currently receiving physical therapy Patient does have a prolapsed bladder and does have trouble with bladder leakage  Immunizations and Health Maintenance Immunization History  Administered Date(s) Administered  . Influenza Whole 11/16/2012  . Influenza, High Dose Seasonal PF 09/15/2016, 09/04/2017  . Influenza,inj,Quad PF,6+ Mos 09/15/2013, 10/06/2014, 11/01/2015  . Pneumococcal Conjugate-13 11/01/2015  . Pneumococcal Polysaccharide-23 09/04/2017  . Tdap 05/01/2017   There are no preventive care reminders to display for this patient.  Patient Care Team: Chipper Herb, MD as PCP - General (Family Medicine) Hillary Bow, MD (Cardiology) Sherren Mocha, MD (Cardiology) Gatha Mayer, MD (Gastroenterology) Newton Pigg, MD (Obstetrics and  Gynecology) Garald Balding, MD (Orthopedic Surgery) Marcial Pacas, MD as Consulting Physician (Neurology)  Indicate any recent Medical Services you may have received from other than [facility] providers in the past year (date may be approximate).     Assessment:   This is a routine wellness examination for [facility].  Hearing/Vision screen No exam data present Patient is due for eye exam in April  Dietary issues and exercise activities discussed: Current Exercise Habits: The patient does not participate in regular exercise at present  Goals    . Exercise 3x per week (30 min per time)      Depression Screen PHQ 2/9 Scores 01/30/2018 01/14/2018 09/04/2017 05/01/2017 02/04/2017 02/01/2017 12/26/2016  PHQ - 2 Score 1 1 0 0 0 0 0  PHQ- 9 Score - - - - - - -    Fall Risk Fall Risk  01/30/2018 01/14/2018 09/04/2017 05/01/2017 02/04/2017  Falls in the past year? No No No No No  Number falls in past yr: - - - - -  Injury with Fall? - - - - -    Is the patient's home free of loose throw rugs in walkways, pet beds, electrical cords, etc?   yes      Grab bars in the bathroom? yes      Adequate lighting?   yes  Timed Get Up and Go Performed   Cognitive Function: MMSE - Mini Mental State Exam 01/30/2018 01/22/2018 10/22/2017 03/22/2016  Orientation to time 5 5 5 5   Orientation to Place 5 5 5 5   Registration 3 3 3 3   Attention/ Calculation 5 3 5 5   Recall 3 3 2 3   Language- name 2 objects 2 2 2 2   Language- repeat 1 1 1 1   Language- follow 3 step command 3 3 3 3   Language- read & follow direction 1 1 1 1   Write a sentence 1 1 1 1   Copy design 1 1 1 1   Total score 30 28 29 30     Patient scored a 30 out of 30     Screening Tests Health Maintenance  Topic Date Due  . Hepatitis C Screening  05/02/2023 (Originally 26-Jan-1951)  . COLON CANCER SCREENING ANNUAL FOBT  09/16/2018  . COLONOSCOPY  01/14/2019  . MAMMOGRAM  11/16/2019  . TETANUS/TDAP  05/02/2027  . INFLUENZA VACCINE  Completed  . DEXA  SCAN  Completed  . PNA vac Low Risk Adult  Completed    Qualifies for Shingles Vaccine? Yes and patient would like to discuss with eye Dr. Since he has had her on valtrex in the past  Cancer Screenings: Lung: Low Dose CT Chest recommended if Age 67-80 years, 30 pack-year currently smoking OR have quit w/in 15years. Patient does not qualify. Breast: Up to date on Mammogram? Yes    Up to date of Bone Density/Dexa? Yes Colorectal: Patient due for colonoscopy 12/2018  Additional Screenings: Hepatitis B/HIV/Syphillis: Hepatitis C Screening:      Plan:    Patient will follow up with Dr. Laurance Flatten as planned Patient will continue with physical therapy  Patient will follow up with neurologist as planned Patient will discuss shingrix with eye Dr. In April.  We will call and get mammogram report that patient had in October.   I have personally reviewed and noted the following in the patient's chart:   . Medical and social history . Use of alcohol, tobacco or illicit drugs  . Current medications and supplements . Functional ability and status . Nutritional status . Physical activity . Advanced directives . List of other physicians . Hospitalizations, surgeries, and ER visits in previous 12 months . Vitals . Screenings to include cognitive, depression, and falls . Referrals and appointments  In addition, I have reviewed and discussed with patient certain preventive protocols, quality metrics, and best practice recommendations. A written personalized care plan for preventive services as well as general preventive health recommendations were provided to patient.     Gareth Morgan, LPN   [id number]/[id number]    I have reviewed and agree with the above AWV documentation.   Mary-Kailene Hassell Done, FNP

## 2018-01-30 NOTE — Telephone Encounter (Signed)
Patient just wanted your opinion on duloxetine. Patient states neurologist wants her to start taking and she just wanted to run it by you.

## 2018-01-30 NOTE — Therapy (Signed)
[facility] [place], [place], [address] Phone: [phone]   Fax:  [phone]  Physical Therapy Treatment  Patient Details  Name: Azaylah Stailey MRN: [id number] Date of Birth: 04-04-1951 Referring Provider: Marcial Pacas MD.   Encounter Date: 01/30/2018  PT End of Session - 01/30/18 1315    Visit Number  2    Number of Visits  16    Date for PT Re-Evaluation  04/28/18    Authorization Type  FOTO AT LEAST EVERY 5TH VISIT.    PT Start Time  1115    PT Stop Time  1214    PT Time Calculation (min)  59 min    Activity Tolerance  Patient tolerated treatment well;Treatment limited secondary to medical complications (Comment)    Behavior During Therapy  [facility] for tasks assessed/performed       Past Medical History:  Diagnosis Date  . Adenomatous colon polyp   . Allergy   . Anxiety   . Arthritis   . CAD (coronary artery disease)    Dr. Burt Knack follows   . Chronic headaches   . Cystocele   . Diverticulosis   . External hemorrhoids   . HLD (hyperlipidemia)   . HTN (hypertension)   . IBS (irritable bowel syndrome)   . Memory loss   . Obesity   . Pneumonia   . Skin cancer    squamous cell - face    Past Surgical History:  Procedure Laterality Date  . CARPAL TUNNEL RELEASE    . COLONOSCOPY  12/15/2008   diverticulosis, external hemorrhoids  . KNEE SURGERY  05/22/2015   right  . LAPAROSCOPY    . SKIN SURGERY     squamous cell - right face     There were no vitals filed for this visit.  Subjective Assessment - 01/30/18 1315    Subjective  Sleeping with the pillow between knees helped a lot.    Pain Score  6     Pain Location  Back    Pain Orientation  Left    Pain Onset  More than a month ago                      [facility] Adult PT Treatment/Exercise - 01/30/18 0001      Modalities   Modalities  Electrical Stimulation;Moist Heat;Ultrasound      Moist Heat Therapy   Number Minutes Moist  Heat  20 Minutes    Moist Heat Location  -- Left SIJ.      Acupuncturist Location  Left SIJ    Electrical Stimulation Action  Pre-mod.    Electrical Stimulation Parameters  80-150 Hz x 20 minutes.    Electrical Stimulation Goals  Pain      Ultrasound   Ultrasound Location  Left lower lumbar/SIJ region.    Ultrasound Parameters  !.50 W/CM2 x 12 minutes combo e'stim/U/S.      Manual Therapy   Manual Therapy  Soft tissue mobilization    Soft tissue mobilization  Right sdly position with 2 pillows between knees for comfort:  STW/M to left SIJ region and QL release x 12 minutes.               PT Short Term Goals - 01/28/18 1237      PT SHORT TERM GOAL #1   Title  STG's=LTG's.        PT Long Term Goals - 01/28/18 1237  PT LONG TERM GOAL #1   Title  Ind with HEP.    Time  4    Period  Weeks    Status  New      PT LONG TERM GOAL #2   Title  Stand 30 minutes with pain not > 3/10.    Time  8    Period  Weeks    Status  New      PT LONG TERM GOAL #3   Title  Sit 30 minutes with pain not > 3/10.    Time  8    Period  Weeks    Status  New      PT LONG TERM GOAL #4   Title  Sit to stand x 5 with pain not > 2-3/10.    Time  8    Period  Weeks    Status  New      PT LONG TERM GOAL #5   Title  Perform ADL's with pain not > 3/10.    Time  8    Period  Weeks    Status  New            Plan - 01/30/18 1407    Clinical Impression Statement  The patient did great today.  Upon deeper palpation she was found to be tender over her left QL.  She felt better after treatment.    PT Treatment/Interventions  ADLs/Self Care Home Management;Cryotherapy;Electrical Stimulation;Ultrasound;Moist Heat;Functional mobility training;Therapeutic activities;Therapeutic exercise;Patient/family education;Manual techniques;Dry needling    PT Next Visit Plan  In right SDLY position with pillows between knees:  Combo e'stim/U/S to left SIJ region f/b  STW/M.  Left LE strengthening.  Core exercise progression.    Consulted and Agree with Plan of Care  Patient       Patient will benefit from skilled therapeutic intervention in order to improve the following deficits and impairments:  Decreased activity tolerance, Decreased mobility, Decreased strength, Pain  Visit Diagnosis: Chronic left-sided low back pain, with sciatica presence unspecified  Muscle weakness (generalized)     Problem List Patient Active Problem List   Diagnosis Date Noted  . Left lumbar radiculopathy 01/22/2018  . Mild cognitive impairment 10/22/2017  . Anxiety 10/22/2017  . Otitis of left ear 02/01/2017  . Aortic atherosclerosis ([place]) 12/04/2016  . Allergic drug reaction 06/25/2015  . Dehydration with hyponatremia 06/25/2015  . HCAP (healthcare-associated pneumonia) 06/25/2015  . Vitamin D deficiency 08/19/2014  . Family history of colon cancer-father at 18+ grandparent w/ rectal cancer 01/14/2014  . Osteopenia 08/12/2013  . CAD (coronary artery disease), native coronary artery 03/22/2013  . Aortic valve stenosis, mild 03/22/2013  . Hyperlipemia 03/11/2013  . Hypertension 03/11/2013  . Allergic rhinitis 03/11/2013  . Left shoulder pain 03/11/2013  . IBS (irritable bowel syndrome) - with chronic recurrent abdominal pain 02/01/2012  . Personal history of colonic polyps - adenoma 02/01/2012  . Coccydynia 02/01/2012    Jaxtin Raimondo, [place] MPT 01/30/2018, 2:11 PM  [facility] [address] [place], [place], [address] Phone: [phone]   Fax:  [phone]  Name: Dorthey Depace MRN: [id number] Date of Birth: [birth year]

## 2018-01-30 NOTE — Patient Instructions (Signed)
  Ms. Birenbaum , Thank you for taking time to come for your Medicare Wellness Visit. I appreciate your ongoing commitment to your health goals. Please review the following plan we discussed and let me know if I can assist you in the future.   These are the goals we discussed: Goals    . Exercise 3x per week (30 min per time)       This is a list of the screening recommended for you and due dates:  Health Maintenance  Topic Date Due  .  Hepatitis C: One time screening is recommended by Center for Disease Control  (CDC) for  adults born from [birth year] through 1966.   05/02/2023*  . Stool Blood Test  09/16/2018  . Colon Cancer Screening  01/14/2019  . Mammogram  11/16/2019  . Tetanus Vaccine  05/02/2027  . Flu Shot  Completed  . DEXA scan (bone density measurement)  Completed  . Pneumonia vaccines  Completed  *Topic was postponed. The date shown is not the original due date.

## 2018-01-31 NOTE — Telephone Encounter (Signed)
Per Dr. Laurance Flatten he feels that cymbalta would be a good choice for patient and feels like she should give it a try. Patient aware of recommendations.

## 2018-02-03 ENCOUNTER — Ambulatory Visit: Payer: Medicare Other | Admitting: Physical Therapy

## 2018-02-03 ENCOUNTER — Encounter: Payer: Self-pay | Admitting: Physical Therapy

## 2018-02-03 DIAGNOSIS — M545 Low back pain: Principal | ICD-10-CM

## 2018-02-03 DIAGNOSIS — M6281 Muscle weakness (generalized): Secondary | ICD-10-CM

## 2018-02-03 DIAGNOSIS — G8929 Other chronic pain: Secondary | ICD-10-CM

## 2018-02-03 NOTE — Therapy (Signed)
[facility] [place], [place], [address] Phone: [phone]   Fax:  [phone]  Physical Therapy Treatment  Patient Details  Name: Shelly Bennett MRN: [id number] Date of Birth: 12/27/1950 Referring Provider: Marcial Pacas MD.   Encounter Date: 02/03/2018  PT End of Session - 02/03/18 1611    Visit Number  3    Number of Visits  16    Date for PT Re-Evaluation  04/28/18    Authorization Type  FOTO AT LEAST EVERY 5TH VISIT.    PT Start Time  0241    PT Stop Time  0331    PT Time Calculation (min)  50 min    Activity Tolerance  Patient tolerated treatment well    Behavior During Therapy  [facility] for tasks assessed/performed       Past Medical History:  Diagnosis Date  . Adenomatous colon polyp   . Allergy   . Anxiety   . Arthritis   . CAD (coronary artery disease)    Dr. Burt Knack follows   . Chronic headaches   . Cystocele   . Diverticulosis   . External hemorrhoids   . HLD (hyperlipidemia)   . HTN (hypertension)   . IBS (irritable bowel syndrome)   . Memory loss   . Obesity   . Pneumonia   . Skin cancer    squamous cell - face    Past Surgical History:  Procedure Laterality Date  . CARPAL TUNNEL RELEASE    . COLONOSCOPY  12/15/2008   diverticulosis, external hemorrhoids  . KNEE SURGERY  05/22/2015   right  . LAPAROSCOPY    . SKIN SURGERY     squamous cell - right face     There were no vitals filed for this visit.  Subjective Assessment - 02/03/18 1615    Subjective  That last treatment helped me a lot.  I walked better and longer.  I'm sitting having problems getting up from sitting.    Currently in Pain?  Yes    Pain Score  4     Pain Location  Back    Pain Orientation  Left    Pain Descriptors / Indicators  Aching;Sharp    Pain Type  Chronic pain    Pain Onset  More than a month ago                      [facility] Adult PT Treatment/Exercise - 02/03/18 0001      Moist Heat  Therapy   Number Minutes Moist Heat  20 Minutes    Moist Heat Location  -- Lower lumbar/SIJ's.      Acupuncturist Location  Left SIJ/Left lower lumbar region.    Electrical Stimulation Action  Pre-mod.    Electrical Stimulation Parameters  80-150 Hz x 20 minutes.    Electrical Stimulation Goals  Pain      Ultrasound   Ultrasound Location  Left lower lumbar/left SIJ.    Ultrasound Parameters  1.50 W/CM2 x 12 minutes.      Manual Therapy   Manual Therapy  Soft tissue mobilization    Soft tissue mobilization  Right sdly position with folded pillow between knees for comfort:  STW/M to left sacral iliac ligament and left QL release technique including ischemic release technique x 11 minutes.               PT Short Term Goals - 01/28/18 1237  PT SHORT TERM GOAL #1   Title  STG's=LTG's.        PT Long Term Goals - 01/28/18 1237      PT LONG TERM GOAL #1   Title  Ind with HEP.    Time  4    Period  Weeks    Status  New      PT LONG TERM GOAL #2   Title  Stand 30 minutes with pain not > 3/10.    Time  8    Period  Weeks    Status  New      PT LONG TERM GOAL #3   Title  Sit 30 minutes with pain not > 3/10.    Time  8    Period  Weeks    Status  New      PT LONG TERM GOAL #4   Title  Sit to stand x 5 with pain not > 2-3/10.    Time  8    Period  Weeks    Status  New      PT LONG TERM GOAL #5   Title  Perform ADL's with pain not > 3/10.    Time  8    Period  Weeks    Status  New            Plan - 02/03/18 1632    Clinical Impression Statement  Great response to treatment today.  She felt better following.  The patient is still quite tender over her left SIJ and left QL.  Instructed patient in hip bridges.    PT Treatment/Interventions  ADLs/Self Care Home Management;Cryotherapy;Electrical Stimulation;Ultrasound;Moist Heat;Functional mobility training;Therapeutic activities;Therapeutic exercise;Patient/family  education;Manual techniques;Dry needling    PT Next Visit Plan  In right SDLY position with pillows between knees:  Combo e'stim/U/S to left SIJ region f/b STW/M.  Left LE strengthening.  Core exercise progression.    Consulted and Agree with Plan of Care  Patient       Patient will benefit from skilled therapeutic intervention in order to improve the following deficits and impairments:  Decreased activity tolerance, Decreased mobility, Decreased strength, Pain  Visit Diagnosis: Chronic left-sided low back pain, with sciatica presence unspecified  Muscle weakness (generalized)     Problem List Patient Active Problem List   Diagnosis Date Noted  . Left lumbar radiculopathy 01/22/2018  . Mild cognitive impairment 10/22/2017  . Anxiety 10/22/2017  . Otitis of left ear 02/01/2017  . Aortic atherosclerosis ([place]) 12/04/2016  . Allergic drug reaction 06/25/2015  . Dehydration with hyponatremia 06/25/2015  . HCAP (healthcare-associated pneumonia) 06/25/2015  . Vitamin D deficiency 08/19/2014  . Family history of colon cancer-father at 40+ grandparent w/ rectal cancer 01/14/2014  . Osteopenia 08/12/2013  . CAD (coronary artery disease), native coronary artery 03/22/2013  . Aortic valve stenosis, mild 03/22/2013  . Hyperlipemia 03/11/2013  . Hypertension 03/11/2013  . Allergic rhinitis 03/11/2013  . Left shoulder pain 03/11/2013  . IBS (irritable bowel syndrome) - with chronic recurrent abdominal pain 02/01/2012  . Personal history of colonic polyps - adenoma 02/01/2012  . Coccydynia 02/01/2012    APP[place], [place] MPT 02/03/2018, 4:35 PM  [facility] [address] [place], [place], [address] Phone: [phone]   Fax:  [phone]  Name: Avaree Gilberti MRN: [id number] Date of Birth: [birth year]

## 2018-02-07 ENCOUNTER — Encounter: Payer: Self-pay | Admitting: *Deleted

## 2018-02-07 ENCOUNTER — Ambulatory Visit: Payer: Medicare Other | Admitting: *Deleted

## 2018-02-07 DIAGNOSIS — M6281 Muscle weakness (generalized): Secondary | ICD-10-CM

## 2018-02-07 DIAGNOSIS — M545 Low back pain: Principal | ICD-10-CM

## 2018-02-07 DIAGNOSIS — G8929 Other chronic pain: Secondary | ICD-10-CM

## 2018-02-07 NOTE — Therapy (Signed)
[facility] [place], [place], [address] Phone: [phone]   Fax:  [phone]  Physical Therapy Treatment  Patient Details  Name: Shelly Bennett MRN: [id number] Date of Birth: 07-24-1951 Referring Provider: Marcial Pacas MD.   Encounter Date: 02/07/2018  PT End of Session - 02/07/18 1233    Visit Number  4    Number of Visits  16    Date for PT Re-Evaluation  04/28/18    Authorization Type  FOTO AT LEAST EVERY 5TH VISIT.    PT Start Time  1030    PT Stop Time  1120    PT Time Calculation (min)  50 min       Past Medical History:  Diagnosis Date  . Adenomatous colon polyp   . Allergy   . Anxiety   . Arthritis   . CAD (coronary artery disease)    Dr. Burt Knack follows   . Chronic headaches   . Cystocele   . Diverticulosis   . External hemorrhoids   . HLD (hyperlipidemia)   . HTN (hypertension)   . IBS (irritable bowel syndrome)   . Memory loss   . Obesity   . Pneumonia   . Skin cancer    squamous cell - face    Past Surgical History:  Procedure Laterality Date  . CARPAL TUNNEL RELEASE    . COLONOSCOPY  12/15/2008   diverticulosis, external hemorrhoids  . KNEE SURGERY  05/22/2015   right  . LAPAROSCOPY    . SKIN SURGERY     squamous cell - right face     There were no vitals filed for this visit.  Subjective Assessment - 02/07/18 1036    Subjective  Last Rx helped, but LT side gets a sharp pain    Pertinent History  DDD; spinal stenosis; osteopenia.      Limitations  Sitting;Standing    How long can you sit comfortably?  20 minutes.    How long can you stand comfortably?  10 minutes.    How long can you walk comfortably?  Short community distances.    Diagnostic tests  MRI:  multi-level degenerative changes most severe at L3-4 with moderate spinal stenosis.      Patient Stated Goals  I want to get out of pain and do things without fear.      Currently in Pain?  Yes    Pain Score  5     Pain  Location  Back    Pain Orientation  Left    Pain Descriptors / Indicators  Aching;Sharp    Pain Type  Chronic pain    Pain Onset  More than a month ago    Pain Frequency  Constant                      [facility] Adult PT Treatment/Exercise - 02/07/18 0001      Modalities   Modalities  Electrical Stimulation;Moist Heat;Ultrasound      Moist Heat Therapy   Number Minutes Moist Heat  15 Minutes    Moist Heat Location  Lumbar Spine      Electrical Stimulation   Electrical Stimulation Location  Left SIJ/Left lower lumbar region. Premod x15 mins 80-150hz     Electrical Stimulation Goals  Pain      Ultrasound   Ultrasound Location  LT SIJ/LB    Ultrasound Parameters  1.5 w/cm2 x 12 mins    Ultrasound Goals  Pain  Manual Therapy   Manual Therapy  Soft tissue mobilization    Soft tissue mobilization  Right sdly position with folded pillow between knees for comfort:  STW/M to left sacral iliac ligament and left QL release technique including ischemic release technique x 11 minutes.               PT Short Term Goals - 01/28/18 1237      PT SHORT TERM GOAL #1   Title  STG's=LTG's.        PT Long Term Goals - 01/28/18 1237      PT LONG TERM GOAL #1   Title  Ind with HEP.    Time  4    Period  Weeks    Status  New      PT LONG TERM GOAL #2   Title  Stand 30 minutes with pain not > 3/10.    Time  8    Period  Weeks    Status  New      PT LONG TERM GOAL #3   Title  Sit 30 minutes with pain not > 3/10.    Time  8    Period  Weeks    Status  New      PT LONG TERM GOAL #4   Title  Sit to stand x 5 with pain not > 2-3/10.    Time  8    Period  Weeks    Status  New      PT LONG TERM GOAL #5   Title  Perform ADL's with pain not > 3/10.    Time  8    Period  Weeks    Status  New            Plan - 02/07/18 1234    Clinical Impression Statement  Pt  arrived today doing a little better with LBP. She continues to get intermittent sharp pain LT  side LB and SIJ. She still had notable tenderness LT SIJ and QL during STW. Decreased pain after Rx.    Clinical Presentation  Evolving    Clinical Decision Making  Moderate    Rehab Potential  Good    Clinical Impairments Affecting Rehab Potential  Left LE weakness.    PT Frequency  2x / week    PT Duration  8 weeks    PT Treatment/Interventions  ADLs/Self Care Home Management;Cryotherapy;Electrical Stimulation;Ultrasound;Moist Heat;Functional mobility training;Therapeutic activities;Therapeutic exercise;Patient/family education;Manual techniques;Dry needling    PT Next Visit Plan  In right SDLY position with pillows between knees:  Combo e'stim/U/S to left SIJ region f/b STW/M.  Left LE strengthening.  Core exercise progression.    Consulted and Agree with Plan of Care  Patient       Patient will benefit from skilled therapeutic intervention in order to improve the following deficits and impairments:  Decreased activity tolerance, Decreased mobility, Decreased strength, Pain  Visit Diagnosis: Chronic left-sided low back pain, with sciatica presence unspecified  Muscle weakness (generalized)     Problem List Patient Active Problem List   Diagnosis Date Noted  . Left lumbar radiculopathy 01/22/2018  . Mild cognitive impairment 10/22/2017  . Anxiety 10/22/2017  . Otitis of left ear 02/01/2017  . Aortic atherosclerosis ([place]) 12/04/2016  . Allergic drug reaction 06/25/2015  . Dehydration with hyponatremia 06/25/2015  . HCAP (healthcare-associated pneumonia) 06/25/2015  . Vitamin D deficiency 08/19/2014  . Family history of colon cancer-father at 97+ grandparent w/ rectal cancer 01/14/2014  . Osteopenia 08/12/2013  .  CAD (coronary artery disease), native coronary artery 03/22/2013  . Aortic valve stenosis, mild 03/22/2013  . Hyperlipemia 03/11/2013  . Hypertension 03/11/2013  . Allergic rhinitis 03/11/2013  . Left shoulder pain 03/11/2013  . IBS (irritable bowel syndrome) -  with chronic recurrent abdominal pain 02/01/2012  . Personal history of colonic polyps - adenoma 02/01/2012  . Coccydynia 02/01/2012    Shelly Bennett,Shelly Bennett, PTA 02/07/2018, 12:43 PM  [facility] [place], [place], [address] Phone: [phone]   Fax:  [phone]  Name: Shelly Bennett MRN: [id number] Date of Birth: 1951/11/29

## 2018-02-10 NOTE — Addendum Note (Signed)
Addended by: Thana Ates on: 02/10/2018 12:22 PM   Modules accepted: Level of Service

## 2018-02-12 ENCOUNTER — Encounter: Payer: Self-pay | Admitting: Physical Therapy

## 2018-02-12 ENCOUNTER — Ambulatory Visit: Payer: Medicare Other | Admitting: Physical Therapy

## 2018-02-12 DIAGNOSIS — G8929 Other chronic pain: Secondary | ICD-10-CM

## 2018-02-12 DIAGNOSIS — M545 Low back pain: Principal | ICD-10-CM

## 2018-02-12 DIAGNOSIS — M6281 Muscle weakness (generalized): Secondary | ICD-10-CM

## 2018-02-12 NOTE — Therapy (Signed)
[facility] [place], [place], [address] Phone: [phone]   Fax:  [phone]  Physical Therapy Treatment  Patient Details  Name: Shelly Bennett MRN: [id number] Date of Birth: 1951-10-19 Referring Provider: Marcial Pacas MD.   Encounter Date: 02/12/2018  PT End of Session - 02/12/18 1436    Visit Number  5    Number of Visits  16    Date for PT Re-Evaluation  04/28/18    Authorization Type  FOTO AT LEAST EVERY 5TH VISIT.    PT Start Time  1436    PT Stop Time  1526    PT Time Calculation (min)  50 min    Activity Tolerance  Patient tolerated treatment well    Behavior During Therapy  WFL for tasks assessed/performed       Past Medical History:  Diagnosis Date  . Adenomatous colon polyp   . Allergy   . Anxiety   . Arthritis   . CAD (coronary artery disease)    Dr. Burt Knack follows   . Chronic headaches   . Cystocele   . Diverticulosis   . External hemorrhoids   . HLD (hyperlipidemia)   . HTN (hypertension)   . IBS (irritable bowel syndrome)   . Memory loss   . Obesity   . Pneumonia   . Skin cancer    squamous cell - face    Past Surgical History:  Procedure Laterality Date  . CARPAL TUNNEL RELEASE    . COLONOSCOPY  12/15/2008   diverticulosis, external hemorrhoids  . KNEE SURGERY  05/22/2015   right  . LAPAROSCOPY    . SKIN SURGERY     squamous cell - right face     There were no vitals filed for this visit.  Subjective Assessment - 02/12/18 1435    Subjective  Patient reports she is better but still having pain. Walking is less painful. Still having pain with sit to stands. Reports that her 68 year old mother requires constant care and she has to help her with transfers and ambulation and that her mother is dead weight.    Pertinent History  DDD; spinal stenosis; osteopenia.      Limitations  Sitting;Standing    How long can you sit comfortably?  20 minutes.    How long can you stand  comfortably?  10 minutes.    How long can you walk comfortably?  Short community distances.    Diagnostic tests  MRI:  multi-level degenerative changes most severe at L3-4 with moderate spinal stenosis.      Patient Stated Goals  I want to get out of pain and do things without fear.      Currently in Pain?  Yes    Pain Score  5     Pain Location  Back    Pain Orientation  Left;Lower    Pain Descriptors / Indicators  Aching;Discomfort    Pain Type  Chronic pain    Pain Onset  More than a month ago    Pain Frequency  Intermittent    Aggravating Factors   Sit to stands    Pain Relieving Factors  Standing         [facility] PT Assessment - 02/12/18 0001      Assessment   Medical Diagnosis  Left lumbar radiculopathy.      Precautions   Precautions  None  [facility] Adult PT Treatment/Exercise - 02/12/18 0001      Modalities   Modalities  Electrical Stimulation;Moist Heat;Ultrasound      Moist Heat Therapy   Number Minutes Moist Heat  15 Minutes    Moist Heat Location  Lumbar Spine      Electrical Stimulation   Electrical Stimulation Location  L SI/ Glute    Electrical Stimulation Action  Pre-Mod    Electrical Stimulation Parameters  80-150 hz x15 min    Electrical Stimulation Goals  Pain      Ultrasound   Ultrasound Location  L SI/ low back    Ultrasound Parameters  1.5 w/cm2, 100%, 1 mhz x10 min    Ultrasound Goals  Pain      Manual Therapy   Manual Therapy  Soft tissue mobilization    Soft tissue mobilization  STW/MFR to L Ql, lumbar paraspinals, Piriformis and glute in R SL to reduce muscle tightness and pain               PT Short Term Goals - 01/28/18 1237      PT SHORT TERM GOAL #1   Title  STG's=LTG's.        PT Long Term Goals - 01/28/18 1237      PT LONG TERM GOAL #1   Title  Ind with HEP.    Time  4    Period  Weeks    Status  New      PT LONG TERM GOAL #2   Title  Stand 30 minutes with pain not > 3/10.    Time  8     Period  Weeks    Status  New      PT LONG TERM GOAL #3   Title  Sit 30 minutes with pain not > 3/10.    Time  8    Period  Weeks    Status  New      PT LONG TERM GOAL #4   Title  Sit to stand x 5 with pain not > 2-3/10.    Time  8    Period  Weeks    Status  New      PT LONG TERM GOAL #5   Title  Perform ADL's with pain not > 3/10.    Time  8    Period  Weeks    Status  New            Plan - 02/12/18 1606    Clinical Impression Statement  Patient arrived to clinic with reports of improvement in pain but still especially painful with sit to stand. Patient presented with increased muscle tightness of the L QL and very tender to palpation and manual therapy of the R SI ligament and piriformis region. Patient encouraged to continue HEP instructions of piriformis stretch as well as bridging. Normal modalities response noted following removal of the modalities.    Rehab Potential  Good    Clinical Impairments Affecting Rehab Potential  Left LE weakness.    PT Frequency  2x / week    PT Duration  8 weeks    PT Treatment/Interventions  ADLs/Self Care Home Management;Cryotherapy;Electrical Stimulation;Ultrasound;Moist Heat;Functional mobility training;Therapeutic activities;Therapeutic exercise;Patient/family education;Manual techniques;Dry needling    PT Next Visit Plan  Continue as symptoms dictate per PT POC. Educate patient regarding lifting body mechanics.    Consulted and Agree with Plan of Care  Patient       Patient will benefit from skilled therapeutic intervention  in order to improve the following deficits and impairments:  Decreased activity tolerance, Decreased mobility, Decreased strength, Pain  Visit Diagnosis: Chronic left-sided low back pain, with sciatica presence unspecified  Muscle weakness (generalized)     Problem List Patient Active Problem List   Diagnosis Date Noted  . Left lumbar radiculopathy 01/22/2018  . Mild cognitive impairment 10/22/2017   . Anxiety 10/22/2017  . Otitis of left ear 02/01/2017  . Aortic atherosclerosis ([place]) 12/04/2016  . Allergic drug reaction 06/25/2015  . Dehydration with hyponatremia 06/25/2015  . HCAP (healthcare-associated pneumonia) 06/25/2015  . Vitamin D deficiency 08/19/2014  . Family history of colon cancer-father at 24+ grandparent w/ rectal cancer 01/14/2014  . Osteopenia 08/12/2013  . CAD (coronary artery disease), native coronary artery 03/22/2013  . Aortic valve stenosis, mild 03/22/2013  . Hyperlipemia 03/11/2013  . Hypertension 03/11/2013  . Allergic rhinitis 03/11/2013  . Left shoulder pain 03/11/2013  . IBS (irritable bowel syndrome) - with chronic recurrent abdominal pain 02/01/2012  . Personal history of colonic polyps - adenoma 02/01/2012  . Coccydynia 02/01/2012    Standley Brooking, PTA 02/12/2018, 4:16 PM  [facility] [address] [place], [place], [address] Phone: [phone]   Fax:  [phone]  Name: Shelly Bennett MRN: [id number] Date of Birth: 1951-09-25

## 2018-02-14 ENCOUNTER — Ambulatory Visit: Payer: Medicare Other | Attending: Neurology | Admitting: *Deleted

## 2018-02-14 DIAGNOSIS — M6281 Muscle weakness (generalized): Secondary | ICD-10-CM | POA: Insufficient documentation

## 2018-02-14 DIAGNOSIS — G8929 Other chronic pain: Secondary | ICD-10-CM | POA: Diagnosis present

## 2018-02-14 DIAGNOSIS — M545 Low back pain: Secondary | ICD-10-CM | POA: Insufficient documentation

## 2018-02-14 NOTE — Therapy (Signed)
[facility] [place], [place], [address] Phone: [phone]   Fax:  [phone]  Physical Therapy Treatment  Patient Details  Name: Shelly Bennett MRN: [id number] Date of Birth: 05-Mar-1951 Referring Provider: Marcial Pacas MD.   Encounter Date: 02/14/2018  PT End of Session - 02/14/18 0950    Visit Number  6    Number of Visits  16    Date for PT Re-Evaluation  04/28/18    Authorization Type  FOTO AT LEAST EVERY 5TH VISIT.    PT Start Time  0900    PT Stop Time  0951    PT Time Calculation (min)  51 min       Past Medical History:  Diagnosis Date  . Adenomatous colon polyp   . Allergy   . Anxiety   . Arthritis   . CAD (coronary artery disease)    Dr. Burt Knack follows   . Chronic headaches   . Cystocele   . Diverticulosis   . External hemorrhoids   . HLD (hyperlipidemia)   . HTN (hypertension)   . IBS (irritable bowel syndrome)   . Memory loss   . Obesity   . Pneumonia   . Skin cancer    squamous cell - face    Past Surgical History:  Procedure Laterality Date  . CARPAL TUNNEL RELEASE    . COLONOSCOPY  12/15/2008   diverticulosis, external hemorrhoids  . KNEE SURGERY  05/22/2015   right  . LAPAROSCOPY    . SKIN SURGERY     squamous cell - right face     There were no vitals filed for this visit.                   [facility] Adult PT Treatment/Exercise - 02/14/18 0001      Modalities   Modalities  Electrical Stimulation;Moist Heat;Ultrasound      Moist Heat Therapy   Number Minutes Moist Heat  15 Minutes    Moist Heat Location  Lumbar Spine      Electrical Stimulation   Electrical Stimulation Location  Left SIJ/Left lower lumbar region. Premod x15 mins 80-150hz     Electrical Stimulation Goals  Pain      Ultrasound   Ultrasound Location  L SIJ/GLUTE 1.5 w/cm2 x12 mins    Ultrasound Goals  Pain      Manual Therapy   Manual Therapy  Soft tissue mobilization    Soft tissue  mobilization  STW/MFR to L Ql, lumbar paraspinals, Piriformis and glute in R SL to reduce muscle tightness and pain               PT Short Term Goals - 01/28/18 1237      PT SHORT TERM GOAL #1   Title  STG's=LTG's.        PT Long Term Goals - 01/28/18 1237      PT LONG TERM GOAL #1   Title  Ind with HEP.    Time  4    Period  Weeks    Status  New      PT LONG TERM GOAL #2   Title  Stand 30 minutes with pain not > 3/10.    Time  8    Period  Weeks    Status  New      PT LONG TERM GOAL #3   Title  Sit 30 minutes with pain not > 3/10.    Time  8  Period  Weeks    Status  New      PT LONG TERM GOAL #4   Title  Sit to stand x 5 with pain not > 2-3/10.    Time  8    Period  Weeks    Status  New      PT LONG TERM GOAL #5   Title  Perform ADL's with pain not > 3/10.    Time  8    Period  Weeks    Status  New            Plan - 13-Mar-2018 1014    Clinical Impression Statement  Pt arrived today doing better overall, but still having pain LT side SIJ and glute. She did well with Rx and reported decreased pain afterwards, but still had notable tenderness along SIJ and into LT glute during STW.Marland Kitchen Her FOTO  score decreased from 73% limitation to 48% today.    Clinical Decision Making  Moderate    Rehab Potential  Good    PT Treatment/Interventions  ADLs/Self Care Home Management;Cryotherapy;Electrical Stimulation;Ultrasound;Moist Heat;Functional mobility training;Therapeutic activities;Therapeutic exercise;Patient/family education;Manual techniques;Dry needling    PT Next Visit Plan  Continue as symptoms dictate per PT POC. Educate patient regarding lifting body mechanics.    Consulted and Agree with Plan of Care  Patient       Patient will benefit from skilled therapeutic intervention in order to improve the following deficits and impairments:  Decreased activity tolerance, Decreased mobility, Decreased strength, Pain  Visit Diagnosis: Chronic left-sided low  back pain, with sciatica presence unspecified  Muscle weakness (generalized)   G-Codes - 03/13/18 1041    Functional Assessment Tool Used (Outpatient Only)  FOTO 48% limitation visit 6       Problem List Patient Active Problem List   Diagnosis Date Noted  . Left lumbar radiculopathy 01/22/2018  . Mild cognitive impairment 10/22/2017  . Anxiety 10/22/2017  . Otitis of left ear 02/01/2017  . Aortic atherosclerosis ([place]) 12/04/2016  . Allergic drug reaction 06/25/2015  . Dehydration with hyponatremia 06/25/2015  . HCAP (healthcare-associated pneumonia) 06/25/2015  . Vitamin D deficiency 08/19/2014  . Family history of colon cancer-father at 32+ grandparent w/ rectal cancer 01/14/2014  . Osteopenia 08/12/2013  . CAD (coronary artery disease), native coronary artery 03/22/2013  . Aortic valve stenosis, mild 03/22/2013  . Hyperlipemia 03/11/2013  . Hypertension 03/11/2013  . Allergic rhinitis 03/11/2013  . Left shoulder pain 03/11/2013  . IBS (irritable bowel syndrome) - with chronic recurrent abdominal pain 02/01/2012  . Personal history of colonic polyps - adenoma 02/01/2012  . Coccydynia 02/01/2012    Dru Primeau,CHRIS, PTA 2018-03-13, 10:42 AM  [facility] [place], [place], [address] Phone: [phone]   Fax:  [phone]  Name: Mae Denunzio MRN: [id number] Date of Birth: 1951/12/13

## 2018-02-17 ENCOUNTER — Encounter: Payer: Medicare Other | Admitting: Physical Therapy

## 2018-02-19 ENCOUNTER — Encounter: Payer: Self-pay | Admitting: Physical Therapy

## 2018-02-19 ENCOUNTER — Ambulatory Visit: Payer: Medicare Other | Admitting: Physical Therapy

## 2018-02-19 DIAGNOSIS — M545 Low back pain: Secondary | ICD-10-CM | POA: Diagnosis not present

## 2018-02-19 DIAGNOSIS — G8929 Other chronic pain: Secondary | ICD-10-CM

## 2018-02-19 DIAGNOSIS — M6281 Muscle weakness (generalized): Secondary | ICD-10-CM

## 2018-02-19 NOTE — Therapy (Signed)
[facility] [place], [place], [address] Phone: [phone]   Fax:  [phone]  Physical Therapy Treatment  Patient Details  Name: Shelly Bennett MRN: [id number] Date of Birth: 10/15/1951 Referring Provider: Marcial Pacas MD.   Encounter Date: 02/19/2018  PT End of Session - 02/19/18 1317    Visit Number  7    Number of Visits  16    Authorization Type  FOTO AT LEAST EVERY 5TH VISIT.    PT Start Time  1230    PT Stop Time  1327    PT Time Calculation (min)  57 min    Activity Tolerance  Patient tolerated treatment well    Behavior During Therapy  WFL for tasks assessed/performed       Past Medical History:  Diagnosis Date  . Adenomatous colon polyp   . Allergy   . Anxiety   . Arthritis   . CAD (coronary artery disease)    Dr. Burt Knack follows   . Chronic headaches   . Cystocele   . Diverticulosis   . External hemorrhoids   . HLD (hyperlipidemia)   . HTN (hypertension)   . IBS (irritable bowel syndrome)   . Memory loss   . Obesity   . Pneumonia   . Skin cancer    squamous cell - face    Past Surgical History:  Procedure Laterality Date  . CARPAL TUNNEL RELEASE    . COLONOSCOPY  12/15/2008   diverticulosis, external hemorrhoids  . KNEE SURGERY  05/22/2015   right  . LAPAROSCOPY    . SKIN SURGERY     squamous cell - right face     There were no vitals filed for this visit.  Subjective Assessment - 02/19/18 1232    Subjective  Patient had a lot of pain after a 3 hour drive    Pertinent History  DDD; spinal stenosis; osteopenia.      Limitations  Sitting;Standing    How long can you sit comfortably?  20 minutes.    How long can you stand comfortably?  10 minutes.    How long can you walk comfortably?  Short community distances.    Diagnostic tests  MRI:  multi-level degenerative changes most severe at L3-4 with moderate spinal stenosis.      Currently in Pain?  Yes    Pain Score  4     Pain  Location  Back    Pain Orientation  Left;Lower    Pain Descriptors / Indicators  Aching;Discomfort    Pain Type  Chronic pain    Pain Onset  More than a month ago    Pain Frequency  Intermittent    Aggravating Factors   prolong sitting    Pain Relieving Factors  standing                      [facility] Adult PT Treatment/Exercise - 02/19/18 0001      Self-Care   Self-Care  ADL's;Lifting;Posture;Other Self-Care Comments    Other Self-Care Comments   HEP given for all the above      Exercises   Exercises  Lumbar      Lumbar Exercises: Supine   Ab Set  20 reps;5 seconds    Glut Set  20 reps;5 seconds    Bent Knee Raise  3 seconds;Other (comment);Limitations    Bent Knee Raise Limitations  2x10    Bridge  3 seconds;20 reps    Straight  Leg Raise  3 seconds;Limitations;Other (comment)    Straight Leg Raises Limitations  3x10      Moist Heat Therapy   Number Minutes Moist Heat  15 Minutes    Moist Heat Location  Lumbar Spine      Electrical Stimulation   Electrical Stimulation Location  Left SIJ/Left lower lumbar region. Premod x15 mins 80-_0     Electrical Stimulation Goals  Pain      Manual Therapy   Manual Therapy  Soft tissue mobilization    Soft tissue mobilization  STW/MFR to L Ql, lumbar paraspinals, Piriformis and glute in R SL to reduce muscle tightness and pain             PT Education - 02/19/18 1254    Education provided  Yes    Education Details  HEP    Person(s) Educated  Patient    Methods  Explanation;Demonstration;Handout    Comprehension  Verbalized understanding;Returned demonstration       PT Short Term Goals - 01/28/18 1237      PT SHORT TERM GOAL #1   Title  STG's=LTG's.        PT Long Term Goals - 02/19/18 1318      PT LONG TERM GOAL #1   Title  Ind with HEP.    Time  4    Period  Weeks    Status  Achieved      PT LONG TERM GOAL #2   Title  Stand 30 minutes with pain not > 3/10.    Time  8    Period  Weeks     Status  On-going      PT LONG TERM GOAL #3   Title  Sit 30 minutes with pain not > 3/10.    Time  8    Period  Weeks    Status  On-going      PT LONG TERM GOAL #4   Title  Sit to stand x 5 with pain not > 2-3/10.    Time  8    Period  Weeks    Status  On-going      PT LONG TERM GOAL #5   Title  Perform ADL's with pain not > 3/10.    Time  8    Period  Weeks    Status  On-going            Plan - 02/19/18 1319    Clinical Impression Statement  Patient tolerated treatment well today. Focused on education for posture awareness techniques and core activation strength/progression with HEP provided. Patient has reported more pain after prolong sitting or taking care of mother several times a week which is a strain on her back with lifting and movement of her mother. Patient met LTG #1 others ongoing due to pain deficts.     Rehab Potential  Good    Clinical Impairments Affecting Rehab Potential  Left LE weakness.    PT Frequency  2x / week    PT Duration  8 weeks    PT Treatment/Interventions  ADLs/Self Care Home Management;Cryotherapy;Electrical Stimulation;Ultrasound;Moist Heat;Functional mobility training;Therapeutic activities;Therapeutic exercise;Patient/family education;Manual techniques;Dry needling    PT Next Visit Plan  Continue with POC for core activation progression and body mechanics specific for patient to care for her mother (positioning, sit to stand, lifting and bed mobility)    Consulted and Agree with Plan of Care  Patient       Patient will benefit from skilled therapeutic intervention in  order to improve the following deficits and impairments:  Decreased activity tolerance, Decreased mobility, Decreased strength, Pain  Visit Diagnosis: Chronic left-sided low back pain, with sciatica presence unspecified  Muscle weakness (generalized)     Problem List Patient Active Problem List   Diagnosis Date Noted  . Left lumbar radiculopathy 01/22/2018  . Mild  cognitive impairment 10/22/2017  . Anxiety 10/22/2017  . Otitis of left ear 02/01/2017  . Aortic atherosclerosis ([place]) 12/04/2016  . Allergic drug reaction 06/25/2015  . Dehydration with hyponatremia 06/25/2015  . HCAP (healthcare-associated pneumonia) 06/25/2015  . Vitamin D deficiency 08/19/2014  . Family history of colon cancer-father at 33+ grandparent w/ rectal cancer 01/14/2014  . Osteopenia 08/12/2013  . CAD (coronary artery disease), native coronary artery 03/22/2013  . Aortic valve stenosis, mild 03/22/2013  . Hyperlipemia 03/11/2013  . Hypertension 03/11/2013  . Allergic rhinitis 03/11/2013  . Left shoulder pain 03/11/2013  . IBS (irritable bowel syndrome) - with chronic recurrent abdominal pain 02/01/2012  . Personal history of colonic polyps - adenoma 02/01/2012  . Coccydynia 02/01/2012    Louretta Tantillo P, PTA 02/19/2018, 1:37 PM  [facility] [place], [place], [address] Phone: [phone]   Fax:  [phone]  Name: Shelly Bennett MRN: [id number] Date of Birth: 1951/03/20

## 2018-02-19 NOTE — Patient Instructions (Signed)
Pelvic Tilt: Posterior - Legs Bent (Supine)   Tighten stomach and flatten back by rolling pelvis down. Hold _10___ seconds. Relax. Repeat _10-30___ times per set. Do __2__ sets per session. Do _2___ sessions per day.   . Straight Leg Raise   Tighten stomach and slowly raise locked right leg __4__ inches from floor. Repeat __10-30__ times per set. Do __2__ sets per session. Do __2__ sessions per day.   Bent Leg Lift (Hook-Lying)   Tighten stomach and slowly raise right leg _5___ inches from floor. Keep trunk rigid. Hold _3___ seconds. Repeat _10___ times per set. Do ___2-3_ sets per session. Do __2__ sessions per day.  Pelvic Tilt: Posterior - Legs Bent (Supine)   Bridging   Slowly raise buttocks from floor, keeping stomach tight. Repeat _10___ times per set. Do __2__ sets per session. Do __2__ sessions per day.   Brushing Teeth    Place one foot on ledge and one hand on counter. Bend other knee slightly to keep back straight.  Copyright  VHI. All rights reserved.  Refrigerator   Squat with knees apart to reach lower shelves and drawers.   Copyright  VHI. All rights reserved.  Laundry [employer] down and hold basket close to stand. Use leg muscles to do the work.   Copyright  VHI. All rights reserved.  Housework - Vacuuming   Hold the vacuum with arm held at side. Step back and forth to move it, keeping head up. Avoid twisting.   Copyright  VHI. All rights reserved.  Housework - Wiping   Position yourself as close as possible to reach work surface. Avoid straining your back.   Copyright  VHI. All rights reserved.  Gardening - Mowing   Keep arms close to sides and walk with lawn mower.   Copyright  VHI. All rights reserved.  Sleeping on Side   Place pillow between knees. Use cervical support under neck and a roll around waist as needed.   Copyright  VHI. All rights reserved.  Log Roll   Lying on back, bend left knee and place  left arm across chest. Roll all in one movement to the right. Reverse to roll to the left. Always move as one unit.   Copyright  VHI. All rights reserved.  Stand to Sit / Sit to Stand   To sit: Bend knees to lower self onto front edge of chair, then scoot back on seat. To stand: Reverse sequence by placing one foot forward, and scoot to front of seat. Use rocking motion to stand up.  Copyright  VHI. All rights reserved.  Posture - Standing   Good posture is important. Avoid slouching and forward head thrust. Maintain curve in low back and align ears over shoul- ders, hips over ankles.   Copyright  VHI. All rights reserved.  Posture - Sitting   Sit upright, head facing forward. Try using a roll to support lower back. Keep shoulders relaxed, and avoid rounded back. Keep hips level with knees. Avoid crossing legs for long periods.   Copyright  VHI. All rights reserved.  Computer Work   Position work to Programmer, multimedia. Use proper work and seat height. Keep shoulders back and down, wrists straight, and elbows at right angles. Use chair that provides full back support. Add footrest and lumbar roll as needed.   Copyright  VHI. All rights reserved.

## 2018-02-21 ENCOUNTER — Ambulatory Visit: Payer: Medicare Other | Admitting: Physical Therapy

## 2018-02-21 ENCOUNTER — Encounter: Payer: Self-pay | Admitting: Physical Therapy

## 2018-02-21 DIAGNOSIS — G8929 Other chronic pain: Secondary | ICD-10-CM

## 2018-02-21 DIAGNOSIS — M6281 Muscle weakness (generalized): Secondary | ICD-10-CM

## 2018-02-21 DIAGNOSIS — M545 Low back pain: Principal | ICD-10-CM

## 2018-02-21 NOTE — Therapy (Signed)
[facility] [place], [place], [address] Phone: [phone]   Fax:  [phone]  Physical Therapy Treatment  Patient Details  Name: Shelly Bennett MRN: [id number] Date of Birth: Apr 19, 1951 Referring Provider: Marcial Pacas MD.   Encounter Date: 02/21/2018  PT End of Session - 02/21/18 0955    Visit Number  8    Number of Visits  16    Date for PT Re-Evaluation  04/28/18    Authorization Type  FOTO AT LEAST EVERY 5TH VISIT.    PT Start Time  0905    PT Stop Time  0952    PT Time Calculation (min)  47 min    Activity Tolerance  Patient tolerated treatment well    Behavior During Therapy  [facility] for tasks assessed/performed       Past Medical History:  Diagnosis Date  . Adenomatous colon polyp   . Allergy   . Anxiety   . Arthritis   . CAD (coronary artery disease)    Dr. Burt Knack follows   . Chronic headaches   . Cystocele   . Diverticulosis   . External hemorrhoids   . HLD (hyperlipidemia)   . HTN (hypertension)   . IBS (irritable bowel syndrome)   . Memory loss   . Obesity   . Pneumonia   . Skin cancer    squamous cell - face    Past Surgical History:  Procedure Laterality Date  . CARPAL TUNNEL RELEASE    . COLONOSCOPY  12/15/2008   diverticulosis, external hemorrhoids  . KNEE SURGERY  05/22/2015   right  . LAPAROSCOPY    . SKIN SURGERY     squamous cell - right face     There were no vitals filed for this visit.  Subjective Assessment - 02/21/18 0951    Subjective  Reports that she went to bible study and had to sit in a metal chair but it was cushioned. Reports that her choir program on wednesday mornings she also sits in a metal chair. Reports compliance with HEP provided in previous treatment.    Pertinent History  DDD; spinal stenosis; osteopenia.      Limitations  Sitting;Standing    How long can you sit comfortably?  20 minutes.    How long can you stand comfortably?  10 minutes.    How  long can you walk comfortably?  Short community distances.    Diagnostic tests  MRI:  multi-level degenerative changes most severe at L3-4 with moderate spinal stenosis.      Patient Stated Goals  I want to get out of pain and do things without fear.      Currently in Pain?  Yes    Pain Score  4     Pain Location  Back    Pain Orientation  Left;Lower    Pain Descriptors / Indicators  Discomfort    Pain Type  Chronic pain    Pain Onset  More than a month ago         [facility] PT Assessment - 02/21/18 0001      Assessment   Medical Diagnosis  Left lumbar radiculopathy.      Precautions   Precautions  None                  [facility] Adult PT Treatment/Exercise - 02/21/18 0001      Modalities   Modalities  Electrical Stimulation;Moist Heat;Ultrasound      Moist Heat Therapy  Number Minutes Moist Heat  15 Minutes    Moist Heat Location  Lumbar Spine      Electrical Stimulation   Electrical Stimulation Location  L SI/ lumbar paraspinals    Electrical Stimulation Action  Pre-mod    Electrical Stimulation Parameters  80-150 hz x15 min    Electrical Stimulation Goals  Pain      Ultrasound   Ultrasound Location  L SI/ lumbar paraspinals    Ultrasound Parameters  1.5 w/cm2, 100%, 33mhz x10 min    Ultrasound Goals  Pain      Manual Therapy   Manual Therapy  Soft tissue mobilization    Soft tissue mobilization  STW/MFR to L Ql, lumbar paraspinals, Piriformis and glute in R SL to reduce muscle tightness and pain               PT Short Term Goals - 01/28/18 1237      PT SHORT TERM GOAL #1   Title  STG's=LTG's.        PT Long Term Goals - 02/19/18 1318      PT LONG TERM GOAL #1   Title  Ind with HEP.    Time  4    Period  Weeks    Status  Achieved      PT LONG TERM GOAL #2   Title  Stand 30 minutes with pain not > 3/10.    Time  8    Period  Weeks    Status  On-going      PT LONG TERM GOAL #3   Title  Sit 30 minutes with pain not > 3/10.    Time  8     Period  Weeks    Status  On-going      PT LONG TERM GOAL #4   Title  Sit to stand x 5 with pain not > 2-3/10.    Time  8    Period  Weeks    Status  On-going      PT LONG TERM GOAL #5   Title  Perform ADL's with pain not > 3/10.    Time  8    Period  Weeks    Status  On-going            Plan - 02/21/18 1009    Clinical Impression Statement  Patient arrived to treatment well and arrived with reports of HEP compliance. Patient still very palpably tender to L SI joint and patient reported soreness upon manual therapy to SI ligament. Patient continues to present with min to mod muscle tightness of the L lumbar paraspinals and QL as well. Normal modalities response noted following removal of the modalities.    Rehab Potential  Good    Clinical Impairments Affecting Rehab Potential  Left LE weakness.    PT Frequency  2x / week    PT Duration  8 weeks    PT Treatment/Interventions  ADLs/Self Care Home Management;Cryotherapy;Electrical Stimulation;Ultrasound;Moist Heat;Functional mobility training;Therapeutic activities;Therapeutic exercise;Patient/family education;Manual techniques;Dry needling    PT Next Visit Plan  Continue with POC for core activation progression and body mechanics specific for patient to care for her mother (positioning, sit to stand, lifting and bed mobility)    Consulted and Agree with Plan of Care  Patient       Patient will benefit from skilled therapeutic intervention in order to improve the following deficits and impairments:  Decreased activity tolerance, Decreased mobility, Decreased strength, Pain  Visit Diagnosis: Chronic left-sided low  back pain, with sciatica presence unspecified  Muscle weakness (generalized)     Problem List Patient Active Problem List   Diagnosis Date Noted  . Left lumbar radiculopathy 01/22/2018  . Mild cognitive impairment 10/22/2017  . Anxiety 10/22/2017  . Otitis of left ear 02/01/2017  . Aortic atherosclerosis  ([place]) 12/04/2016  . Allergic drug reaction 06/25/2015  . Dehydration with hyponatremia 06/25/2015  . HCAP (healthcare-associated pneumonia) 06/25/2015  . Vitamin D deficiency 08/19/2014  . Family history of colon cancer-father at 36+ grandparent w/ rectal cancer 01/14/2014  . Osteopenia 08/12/2013  . CAD (coronary artery disease), native coronary artery 03/22/2013  . Aortic valve stenosis, mild 03/22/2013  . Hyperlipemia 03/11/2013  . Hypertension 03/11/2013  . Allergic rhinitis 03/11/2013  . Left shoulder pain 03/11/2013  . IBS (irritable bowel syndrome) - with chronic recurrent abdominal pain 02/01/2012  . Personal history of colonic polyps - adenoma 02/01/2012  . Coccydynia 02/01/2012    Standley Brooking, PTA 02/21/2018, 10:13 AM  [facility] [address] [place], [place], [address] Phone: [phone]   Fax:  [phone]  Name: Shelly Bennett MRN: [id number] Date of Birth: [birth year]

## 2018-02-24 ENCOUNTER — Encounter: Payer: Medicare Other | Admitting: Physical Therapy

## 2018-02-25 ENCOUNTER — Ambulatory Visit: Payer: Medicare Other | Admitting: Physical Therapy

## 2018-02-25 ENCOUNTER — Encounter: Payer: Self-pay | Admitting: Physical Therapy

## 2018-02-25 DIAGNOSIS — M545 Low back pain: Secondary | ICD-10-CM | POA: Diagnosis not present

## 2018-02-25 DIAGNOSIS — G8929 Other chronic pain: Secondary | ICD-10-CM

## 2018-02-25 DIAGNOSIS — M6281 Muscle weakness (generalized): Secondary | ICD-10-CM

## 2018-02-25 NOTE — Therapy (Signed)
[facility] [place], [place], [address] Phone: [phone]   Fax:  [phone]  Physical Therapy Treatment  Patient Details  Name: Shelly Bennett MRN: [id number] Date of Birth: 03/03/1951 Referring Provider: Marcial Pacas MD.   Encounter Date: 02/25/2018  PT End of Session - 02/25/18 0735    Visit Number  9    Number of Visits  16    Date for PT Re-Evaluation  04/28/18    Authorization Type  FOTO AT LEAST EVERY 5TH VISIT.    PT Start Time  [id number]    PT Stop Time  0825    PT Time Calculation (min)  49 min    Activity Tolerance  Patient tolerated treatment well    Behavior During Therapy  [facility] for tasks assessed/performed       Past Medical History:  Diagnosis Date  . Adenomatous colon polyp   . Allergy   . Anxiety   . Arthritis   . CAD (coronary artery disease)    Dr. Burt Knack follows   . Chronic headaches   . Cystocele   . Diverticulosis   . External hemorrhoids   . HLD (hyperlipidemia)   . HTN (hypertension)   . IBS (irritable bowel syndrome)   . Memory loss   . Obesity   . Pneumonia   . Skin cancer    squamous cell - face    Past Surgical History:  Procedure Laterality Date  . CARPAL TUNNEL RELEASE    . COLONOSCOPY  12/15/2008   diverticulosis, external hemorrhoids  . KNEE SURGERY  05/22/2015   right  . LAPAROSCOPY    . SKIN SURGERY     squamous cell - right face     There were no vitals filed for this visit.  Subjective Assessment - 02/25/18 0734    Subjective  Reports sitting in chair for funeral service and was able to endure sitting better. Reports even improvement with sitting in metal chairs as well.    Pertinent History  DDD; spinal stenosis; osteopenia.      Limitations  Sitting;Standing    How long can you sit comfortably?  20 minutes.    How long can you stand comfortably?  10 minutes.    How long can you walk comfortably?  Short community distances.    Diagnostic tests  MRI:   multi-level degenerative changes most severe at L3-4 with moderate spinal stenosis.      Patient Stated Goals  I want to get out of pain and do things without fear.      Currently in Pain?  Yes    Pain Score  3     Pain Location  Back    Pain Orientation  Left;Lower    Pain Descriptors / Indicators  Discomfort    Pain Type  Chronic pain    Pain Onset  More than a month ago         [facility] PT Assessment - 02/25/18 0001      Assessment   Medical Diagnosis  Left lumbar radiculopathy.      Precautions   Precautions  None                  [facility] Adult PT Treatment/Exercise - 02/25/18 0001      Lumbar Exercises: Stretches   Single Knee to Chest Stretch  Left;3 reps;30 seconds    Figure 4 Stretch  3 reps;30 seconds;Supine;With overpressure      Modalities   Modalities  Electrical Stimulation;Moist Heat;Ultrasound      Moist Heat Therapy   Number Minutes Moist Heat  15 Minutes    Moist Heat Location  Lumbar Spine      Electrical Stimulation   Electrical Stimulation Location  L SI/ lumbar paraspinals    Electrical Stimulation Action  Pre-Mod    Electrical Stimulation Parameters  80-150 hz x15 min    Electrical Stimulation Goals  Pain      Ultrasound   Ultrasound Location  L SI/ lumbar paraspinals    Ultrasound Parameters  1.5 w/cm2, 100%, 1 mhz x10 min    Ultrasound Goals  Pain      Manual Therapy   Manual Therapy  Soft tissue mobilization    Soft tissue mobilization  STW/MFR to L Ql, lumbar paraspinals, Piriformis and glute in R SL to reduce muscle tightness and pain               PT Short Term Goals - 01/28/18 1237      PT SHORT TERM GOAL #1   Title  STG's=LTG's.        PT Long Term Goals - 02/19/18 1318      PT LONG TERM GOAL #1   Title  Ind with HEP.    Time  4    Period  Weeks    Status  Achieved      PT LONG TERM GOAL #2   Title  Stand 30 minutes with pain not > 3/10.    Time  8    Period  Weeks    Status  On-going      PT LONG  TERM GOAL #3   Title  Sit 30 minutes with pain not > 3/10.    Time  8    Period  Weeks    Status  On-going      PT LONG TERM GOAL #4   Title  Sit to stand x 5 with pain not > 2-3/10.    Time  8    Period  Weeks    Status  On-going      PT LONG TERM GOAL #5   Title  Perform ADL's with pain not > 3/10.    Time  8    Period  Weeks    Status  On-going            Plan - 02/25/18 6734    Clinical Impression Statement  Patient tolerated today's treatment well with only complaints of discomfort with manual therapy to L QL. Patient able to tolerate prolonged sitting better yesterday but continues to report discomfort in taking care of her mother. Continued tightness of the L lumbar paraspinals and QL. Patient able to experience good stretch sensation of L SKTC, figure 4 stretch. Normal modalities response noted following removal of the modalities.    Rehab Potential  Good    Clinical Impairments Affecting Rehab Potential  Left LE weakness.    PT Frequency  2x / week    PT Duration  8 weeks    PT Treatment/Interventions  ADLs/Self Care Home Management;Cryotherapy;Electrical Stimulation;Ultrasound;Moist Heat;Functional mobility training;Therapeutic activities;Therapeutic exercise;Patient/family education;Manual techniques;Dry needling    PT Next Visit Plan  Continue with POC for core activation progression and body mechanics specific for patient to care for her mother (positioning, sit to stand, lifting and bed mobility)    Consulted and Agree with Plan of Care  Patient       Patient will benefit from skilled therapeutic intervention in order to  improve the following deficits and impairments:  Decreased activity tolerance, Decreased mobility, Decreased strength, Pain  Visit Diagnosis: Chronic left-sided low back pain, with sciatica presence unspecified  Muscle weakness (generalized)     Problem List Patient Active Problem List   Diagnosis Date Noted  . Left lumbar radiculopathy  01/22/2018  . Mild cognitive impairment 10/22/2017  . Anxiety 10/22/2017  . Otitis of left ear 02/01/2017  . Aortic atherosclerosis ([place]) 12/04/2016  . Allergic drug reaction 06/25/2015  . Dehydration with hyponatremia 06/25/2015  . HCAP (healthcare-associated pneumonia) 06/25/2015  . Vitamin D deficiency 08/19/2014  . Family history of colon cancer-father at 81+ grandparent w/ rectal cancer 01/14/2014  . Osteopenia 08/12/2013  . CAD (coronary artery disease), native coronary artery 03/22/2013  . Aortic valve stenosis, mild 03/22/2013  . Hyperlipemia 03/11/2013  . Hypertension 03/11/2013  . Allergic rhinitis 03/11/2013  . Left shoulder pain 03/11/2013  . IBS (irritable bowel syndrome) - with chronic recurrent abdominal pain 02/01/2012  . Personal history of colonic polyps - adenoma 02/01/2012  . Coccydynia 02/01/2012    Standley Brooking, PTA 02/25/2018, 8:48 AM  [facility] [address] [place], [place], [address] Phone: [phone]   Fax:  [phone]  Name: Adelina Collard MRN: [id number] Date of Birth: [birth year]

## 2018-02-26 ENCOUNTER — Encounter: Payer: Self-pay | Admitting: Physical Therapy

## 2018-02-26 ENCOUNTER — Ambulatory Visit: Payer: Medicare Other | Admitting: Physical Therapy

## 2018-02-26 DIAGNOSIS — M545 Low back pain: Principal | ICD-10-CM

## 2018-02-26 DIAGNOSIS — M6281 Muscle weakness (generalized): Secondary | ICD-10-CM

## 2018-02-26 DIAGNOSIS — G8929 Other chronic pain: Secondary | ICD-10-CM

## 2018-02-26 NOTE — Therapy (Signed)
[facility] [place], [place], [address] Phone: [phone]   Fax:  [phone]  Physical Therapy Treatment  Patient Details  Name: Shelly Bennett MRN: [id number] Date of Birth: Oct 16, 1951 Referring Provider: Marcial Pacas MD.   Encounter Date: 02/26/2018  PT End of Session - 02/26/18 1437    Visit Number  10    Number of Visits  16    Authorization Type  FOTO AT LEAST EVERY 5TH VISIT.    PT Start Time  1400    PT Stop Time  1443    PT Time Calculation (min)  43 min    Activity Tolerance  Patient tolerated treatment well    Behavior During Therapy  [facility] for tasks assessed/performed       Past Medical History:  Diagnosis Date  . Adenomatous colon polyp   . Allergy   . Anxiety   . Arthritis   . CAD (coronary artery disease)    Dr. Burt Knack follows   . Chronic headaches   . Cystocele   . Diverticulosis   . External hemorrhoids   . HLD (hyperlipidemia)   . HTN (hypertension)   . IBS (irritable bowel syndrome)   . Memory loss   . Obesity   . Pneumonia   . Skin cancer    squamous cell - face    Past Surgical History:  Procedure Laterality Date  . CARPAL TUNNEL RELEASE    . COLONOSCOPY  12/15/2008   diverticulosis, external hemorrhoids  . KNEE SURGERY  05/22/2015   right  . LAPAROSCOPY    . SKIN SURGERY     squamous cell - right face     There were no vitals filed for this visit.  Subjective Assessment - 02/26/18 1417    Subjective  Patient reported treatments are helping    Pertinent History  DDD; spinal stenosis; osteopenia.      Limitations  Sitting;Standing    How long can you sit comfortably?  20 minutes.    How long can you stand comfortably?  10 minutes.    How long can you walk comfortably?  Short community distances.    Diagnostic tests  MRI:  multi-level degenerative changes most severe at L3-4 with moderate spinal stenosis.      Patient Stated Goals  I want to get out of pain and do  things without fear.      Currently in Pain?  Yes    Pain Score  4     Pain Location  Back    Pain Orientation  Left;Lower    Pain Descriptors / Indicators  Sore    Pain Type  Chronic pain    Pain Radiating Towards  left LE    Pain Onset  More than a month ago    Pain Frequency  Intermittent    Aggravating Factors   prolong sitting    Pain Relieving Factors  standing                      [facility] Adult PT Treatment/Exercise - 02/26/18 0001      Moist Heat Therapy   Number Minutes Moist Heat  15 Minutes    Moist Heat Location  Lumbar Spine      Electrical Stimulation   Electrical Stimulation Location  L SI/ lumbar paraspinals    Electrical Stimulation Action  preod    Electrical Stimulation Parameters  80-150hz  x35min    Electrical Stimulation Goals  Pain  Ultrasound   Ultrasound Location  left SI/glut    Ultrasound Parameters  1.5w/cm2/100%/54mhz x57min    Ultrasound Goals  Pain      Manual Therapy   Manual Therapy  Soft tissue mobilization    Soft tissue mobilization  STW/MFR to L Ql, lumbar paraspinals, Piriformis and glute in R SL to reduce muscle tightness and pain               PT Short Term Goals - 01/28/18 1237      PT SHORT TERM GOAL #1   Title  STG's=LTG's.        PT Long Term Goals - 02/19/18 1318      PT LONG TERM GOAL #1   Title  Ind with HEP.    Time  4    Period  Weeks    Status  Achieved      PT LONG TERM GOAL #2   Title  Stand 30 minutes with pain not > 3/10.    Time  8    Period  Weeks    Status  On-going      PT LONG TERM GOAL #3   Title  Sit 30 minutes with pain not > 3/10.    Time  8    Period  Weeks    Status  On-going      PT LONG TERM GOAL #4   Title  Sit to stand x 5 with pain not > 2-3/10.    Time  8    Period  Weeks    Status  On-going      PT LONG TERM GOAL #5   Title  Perform ADL's with pain not > 3/10.    Time  8    Period  Weeks    Status  On-going            Plan - 02/26/18 1441     Clinical Impression Statement  Patient tolerated treatment well today. Patient has ongoing pain in left low back/SI and glut area. patient has more pain with prolong sitting and relief from therapy. Patient has increased pain from caring for her mother who need assist with all ADL's. Patient has been educated on posture awareness techniques and core activation.stretching. Patient has palpable pain in left piriformis/glut and SI area. patient current goals ongoing at this time due to pain limitations.     Rehab Potential  Good    Clinical Impairments Affecting Rehab Potential  Left LE weakness. last FOTO 6th visit    PT Frequency  2x / week    PT Duration  8 weeks    PT Treatment/Interventions  ADLs/Self Care Home Management;Cryotherapy;Electrical Stimulation;Ultrasound;Moist Heat;Functional mobility training;Therapeutic activities;Therapeutic exercise;Patient/family education;Manual techniques;Dry needling    PT Next Visit Plan  cont with POC /consider DN/FOTO next visit    Consulted and Agree with Plan of Care  Patient       Patient will benefit from skilled therapeutic intervention in order to improve the following deficits and impairments:  Decreased activity tolerance, Decreased mobility, Decreased strength, Pain  Visit Diagnosis: Chronic left-sided low back pain, with sciatica presence unspecified  Muscle weakness (generalized)     Problem List Patient Active Problem List   Diagnosis Date Noted  . Left lumbar radiculopathy 01/22/2018  . Mild cognitive impairment 10/22/2017  . Anxiety 10/22/2017  . Otitis of left ear 02/01/2017  . Aortic atherosclerosis ([place]) 12/04/2016  . Allergic drug reaction 06/25/2015  . Dehydration with hyponatremia 06/25/2015  . HCAP (healthcare-associated  pneumonia) 06/25/2015  . Vitamin D deficiency 08/19/2014  . Family history of colon cancer-father at 70+ grandparent w/ rectal cancer 01/14/2014  . Osteopenia 08/12/2013  . CAD (coronary artery  disease), native coronary artery 03/22/2013  . Aortic valve stenosis, mild 03/22/2013  . Hyperlipemia 03/11/2013  . Hypertension 03/11/2013  . Allergic rhinitis 03/11/2013  . Left shoulder pain 03/11/2013  . IBS (irritable bowel syndrome) - with chronic recurrent abdominal pain 02/01/2012  . Personal history of colonic polyps - adenoma 02/01/2012  . Coccydynia 02/01/2012    Dereon Williamsen P, PTA 02/26/2018, 2:54 PM  [facility] [place], [place], [address] Phone: [phone]   Fax:  [phone]  Name: Shelly Bennett MRN: [id number] Date of Birth: [birth year]

## 2018-03-03 ENCOUNTER — Ambulatory Visit: Payer: Medicare Other | Admitting: Physical Therapy

## 2018-03-03 DIAGNOSIS — G8929 Other chronic pain: Secondary | ICD-10-CM

## 2018-03-03 DIAGNOSIS — M545 Low back pain: Secondary | ICD-10-CM | POA: Diagnosis not present

## 2018-03-03 NOTE — Patient Instructions (Addendum)
[facility]  DRY NEEDLING CONSENT FORM   Trigger point dry needling is a physical therapy approach to treat Myofascial Pain and Dysfunction.  Dry Needling (DN) is a valuable and effective way to deactivate myofascial trigger points (muscle knots). It is skilled intervention that uses a thin filiform needle to penetrate the skin and stimulate underlying myofascial trigger points, muscular, and connective tissues for the management of neuromusculoskeletal pain and movement impairments.  A local twitch response (LTR) will be elicited.  This can sometimes feel like a deep ache in the muscle during the procedure. Multiple trigger points in multiple muscles can be treated during each treatment.  No medication of any kind is injected.   As with any medical treatment and procedure, there are possible adverse events.  While significant adverse events are uncommon, they do sometimes occur and must be considered prior to giving consent.  1. Dry needling often causes a "post needling soreness".  There can be an increase in pain from a couple of hours to 2-3 days, followed by an improvement in the overall pain state. 2. Any time a needle is used there is a risk of infection.  However, we are using new, sterile, and disposable needles; infections are extremely rare. 3. There is a possibility that you may bleed or bruise.  You may feel tired and some nausea following treatment. 4. There is a rare possibility of a pneumothorax (air in the chest cavity). 5. Allergic reaction to nickel in the stainless steel needle. 6. If a nerve is touched, it may cause paresthesia (a prickling/shock sensation) which is usually brief, but may continue for a couple of days.  Following treatment stay hydrated.  You may use heat if you are sore. Continue regular activities but not too vigorous initially after treatment for 24-48 hours. You may apply heat to sore muscles.  Dry Needling is best when  combined with other physical therapy interventions such as strengthening, stretching and other therapeutic modalities.     Piriformis (Supine)  Cross legs, right on top. Gently pull other knee toward chest until stretch is felt in buttock/hip of top leg. Hold __30-60__ seconds. Repeat __3__ times per set. Do ____ sets per session. Do __2__ sessions per day.  Or: Alternative:  Hip Stretch  Put right ankle over left knee. Let right knee fall downward, but keep ankle in place. Feel the stretch in hip. May push down gently with hand to feel stretch. Hold ____ seconds while counting out loud. Repeat with other leg. Repeat ____ times. Do ____ sessions per day.  Madelyn Flavors, PT 03/03/18 1:43 PM [facility] [place], [place], [address] Phone: [phone]   Fax:  [phone]

## 2018-03-03 NOTE — Therapy (Addendum)
[facility] [place], [place], [address] Phone: [phone]   Fax:  [phone]  Physical Therapy Treatment  Patient Details  Name: Shelly Bennett MRN: [id number] Date of Birth: 07/27/1951 Referring Provider: Marcial Pacas MD.   Encounter Date: 03/03/2018  PT End of Session - 03/03/18 1303    Visit Number  11    Number of Visits  16    Date for PT Re-Evaluation  04/28/18    Authorization Type  DO FOTO ---------FOTO AT LEAST EVERY 5TH VISIT.    PT Start Time  1300    PT Stop Time  1356    PT Time Calculation (min)  56 min    Activity Tolerance  Patient tolerated treatment well    Behavior During Therapy  WFL for tasks assessed/performed       Past Medical History:  Diagnosis Date  . Adenomatous colon polyp   . Allergy   . Anxiety   . Arthritis   . CAD (coronary artery disease)    Dr. Burt Knack follows   . Chronic headaches   . Cystocele   . Diverticulosis   . External hemorrhoids   . HLD (hyperlipidemia)   . HTN (hypertension)   . IBS (irritable bowel syndrome)   . Memory loss   . Obesity   . Pneumonia   . Skin cancer    squamous cell - face    Past Surgical History:  Procedure Laterality Date  . CARPAL TUNNEL RELEASE    . COLONOSCOPY  12/15/2008   diverticulosis, external hemorrhoids  . KNEE SURGERY  05/22/2015   right  . LAPAROSCOPY    . SKIN SURGERY     squamous cell - right face     There were no vitals filed for this visit.  Subjective Assessment - 03/03/18 1303    Subjective  Patient still experiencing radicular pain in left side. Worsened this weekend with trip to [place]. She stopped every 30 min to stand up and stretch.    Pertinent History  DDD; spinal stenosis; osteopenia.      Patient Stated Goals  I want to get out of pain and do things without fear.      Currently in Pain?  Yes    Pain Score  4     Pain Location  Back    Pain Orientation  Left;Lower    Pain Descriptors /  Indicators  Sore                      [facility] Adult PT Treatment/Exercise - 03/03/18 0001      Lumbar Exercises: Stretches   Figure 4 Stretch  1 rep;30 seconds;With overpressure      Modalities   Modalities  Electrical Stimulation;Moist Heat      Moist Heat Therapy   Number Minutes Moist Heat  15 Minutes    Moist Heat Location  Hip LEFT GLUTEALS      Electrical Stimulation   Electrical Stimulation Location  L gluteals    Electrical Stimulation Action  premod    Electrical Stimulation Parameters  80-150 Hz x 15 min    Electrical Stimulation Goals  Pain      Manual Therapy   Manual Therapy  Myofascial release;Soft tissue mobilization    Soft tissue mobilization  to L glut med, min, max and piriformis    Myofascial Release  to same       Trigger Point Dry Needling - 03/03/18 1540  Consent Given?  Yes    Education Handout Provided  Yes    Muscles Treated Lower Body  Gluteus minimus;Gluteus maximus;Piriformis Left and glut med    Gluteus Maximus Response  Twitch response elicited;Palpable increased muscle length    Gluteus Minimus Response  Palpable increased muscle length;Twitch response elicited    Piriformis Response  Twitch response elicited;Palpable increased muscle length       FOTO: 51% limited    PT Education - 03/03/18 1345    Education provided  Yes    Education Details  DN education and aftercare    Person(s) Educated  Patient    Methods  Explanation;Handout    Comprehension  Verbalized understanding       PT Short Term Goals - 01/28/18 1237      PT SHORT TERM GOAL #1   Title  STG's=LTG's.        PT Long Term Goals - 02/19/18 1318      PT LONG TERM GOAL #1   Title  Ind with HEP.    Time  4    Period  Weeks    Status  Achieved      PT LONG TERM GOAL #2   Title  Stand 30 minutes with pain not > 3/10.    Time  8    Period  Weeks    Status  On-going      PT LONG TERM GOAL #3   Title  Sit 30 minutes with pain not > 3/10.     Time  8    Period  Weeks    Status  On-going      PT LONG TERM GOAL #4   Title  Sit to stand x 5 with pain not > 2-3/10.    Time  8    Period  Weeks    Status  On-going      PT LONG TERM GOAL #5   Title  Perform ADL's with pain not > 3/10.    Time  8    Period  Weeks    Status  On-going            Plan - 03/03/18 1533    Clinical Impression Statement  Patient presented with ongoing c/o pain into LLE. Dry Needling was explained to patient, precautions and contraindications as well as aftercare were discussed. Patient consented to DN and responded very well in L gluteals and piriformis.     PT Treatment/Interventions  ADLs/Self Care Home Management;Cryotherapy;Electrical Stimulation;Ultrasound;Moist Heat;Functional mobility training;Therapeutic activities;Therapeutic exercise;Patient/family education;Manual techniques;Dry needling    PT Next Visit Plan  FOTO next visit, assess DN.     Consulted and Agree with Plan of Care  Patient       Patient will benefit from skilled therapeutic intervention in order to improve the following deficits and impairments:  Decreased activity tolerance, Decreased mobility, Decreased strength, Pain  Visit Diagnosis: Chronic left-sided low back pain, with sciatica presence unspecified     Problem List Patient Active Problem List   Diagnosis Date Noted  . Left lumbar radiculopathy 01/22/2018  . Mild cognitive impairment 10/22/2017  . Anxiety 10/22/2017  . Otitis of left ear 02/01/2017  . Aortic atherosclerosis ([place]) 12/04/2016  . Allergic drug reaction 06/25/2015  . Dehydration with hyponatremia 06/25/2015  . HCAP (healthcare-associated pneumonia) 06/25/2015  . Vitamin D deficiency 08/19/2014  . Family history of colon cancer-father at 17+ grandparent w/ rectal cancer 01/14/2014  . Osteopenia 08/12/2013  . CAD (coronary artery disease), native coronary artery  03/22/2013  . Aortic valve stenosis, mild 03/22/2013  . Hyperlipemia  03/11/2013  . Hypertension 03/11/2013  . Allergic rhinitis 03/11/2013  . Left shoulder pain 03/11/2013  . IBS (irritable bowel syndrome) - with chronic recurrent abdominal pain 02/01/2012  . Personal history of colonic polyps - adenoma 02/01/2012  . Coccydynia 02/01/2012    Madelyn Flavors PT 03/03/2018, 3:42 PM  [facility] [address] [place], [place], [address] Phone: [phone]   Fax:  [phone]  Name: Shelly Bennett MRN: [id number] Date of Birth: 1951/09/01

## 2018-03-05 ENCOUNTER — Ambulatory Visit: Payer: Medicare Other | Admitting: Physical Therapy

## 2018-03-05 ENCOUNTER — Encounter: Payer: Self-pay | Admitting: Physical Therapy

## 2018-03-05 DIAGNOSIS — M545 Low back pain: Principal | ICD-10-CM

## 2018-03-05 DIAGNOSIS — G8929 Other chronic pain: Secondary | ICD-10-CM

## 2018-03-05 DIAGNOSIS — M6281 Muscle weakness (generalized): Secondary | ICD-10-CM

## 2018-03-05 NOTE — Therapy (Signed)
[facility] [place], [place], [address] Phone: [phone]   Fax:  [phone]  Physical Therapy Treatment  Patient Details  Name: Shelly Bennett MRN: [id number] Date of Birth: 12-07-1951 Referring Provider: Marcial Pacas MD.   Encounter Date: 03/05/2018  PT End of Session - 03/05/18 1428    Visit Number  12    Number of Visits  16    Date for PT Re-Evaluation  04/28/18    Authorization Type  FOTO AT LEAST EVERY 5TH VISIT. (next foto visit 16)    PT Start Time  1358    PT Stop Time  1450    PT Time Calculation (min)  52 min    Activity Tolerance  Patient tolerated treatment well    Behavior During Therapy  WFL for tasks assessed/performed       Past Medical History:  Diagnosis Date  . Adenomatous colon polyp   . Allergy   . Anxiety   . Arthritis   . CAD (coronary artery disease)    Dr. Burt Knack follows   . Chronic headaches   . Cystocele   . Diverticulosis   . External hemorrhoids   . HLD (hyperlipidemia)   . HTN (hypertension)   . IBS (irritable bowel syndrome)   . Memory loss   . Obesity   . Pneumonia   . Skin cancer    squamous cell - face    Past Surgical History:  Procedure Laterality Date  . CARPAL TUNNEL RELEASE    . COLONOSCOPY  12/15/2008   diverticulosis, external hemorrhoids  . KNEE SURGERY  05/22/2015   right  . LAPAROSCOPY    . SKIN SURGERY     squamous cell - right face     There were no vitals filed for this visit.  Subjective Assessment - 03/05/18 1402    Subjective  Patient responded well to DN very well last treatment    Pertinent History  DDD; spinal stenosis; osteopenia.      Limitations  Sitting;Standing    How long can you sit comfortably?  20 minutes.    How long can you stand comfortably?  10 minutes.    How long can you walk comfortably?  Short community distances.    Diagnostic tests  MRI:  multi-level degenerative changes most severe at L3-4 with moderate spinal  stenosis.      Patient Stated Goals  I want to get out of pain and do things without fear.      Currently in Pain?  Yes    Pain Score  3     Pain Location  Back    Pain Orientation  Left;Lower    Pain Descriptors / Indicators  Sore    Pain Type  Chronic pain    Pain Onset  More than a month ago    Pain Frequency  Intermittent    Aggravating Factors   prolong sitting    Pain Relieving Factors  standing                      [facility] Adult PT Treatment/Exercise - 03/05/18 0001      Moist Heat Therapy   Number Minutes Moist Heat  15 Minutes    Moist Heat Location  Other (comment) left glut      Electrical Stimulation   Electrical Stimulation Location  L gluteals    Electrical Stimulation Action  premod    Electrical Stimulation Parameters  80-150hz  x53min  Electrical Stimulation Goals  Pain      Ultrasound   Ultrasound Location  left glut/SI    Ultrasound Parameters  1.5w/cm2/100%/67mhz x39min    Ultrasound Goals  Pain      Manual Therapy   Manual Therapy  Myofascial release;Soft tissue mobilization    Soft tissue mobilization  to L glut med, min, max and piriformis               PT Short Term Goals - 01/28/18 1237      PT SHORT TERM GOAL #1   Title  STG's=LTG's.        PT Long Term Goals - 02/19/18 1318      PT LONG TERM GOAL #1   Title  Ind with HEP.    Time  4    Period  Weeks    Status  Achieved      PT LONG TERM GOAL #2   Title  Stand 30 minutes with pain not > 3/10.    Time  8    Period  Weeks    Status  On-going      PT LONG TERM GOAL #3   Title  Sit 30 minutes with pain not > 3/10.    Time  8    Period  Weeks    Status  On-going      PT LONG TERM GOAL #4   Title  Sit to stand x 5 with pain not > 2-3/10.    Time  8    Period  Weeks    Status  On-going      PT LONG TERM GOAL #5   Title  Perform ADL's with pain not > 3/10.    Time  8    Period  Weeks    Status  On-going            Plan - 03/05/18 1443     Clinical Impression Statement  Patient tolerated treatment well today. Patient has responded well to last DN session and reported less discomfort and was able to sit with greater ease. Patient has ongoing palpable pain in left glut/SI area. Patient reported doing HEP daily per instructed by PT. Goals progressing at this time.     Rehab Potential  Good    Clinical Impairments Affecting Rehab Potential  Left LE weakness. last FOTO 6th visit    PT Frequency  2x / week    PT Duration  8 weeks    PT Treatment/Interventions  ADLs/Self Care Home Management;Cryotherapy;Electrical Stimulation;Ultrasound;Moist Heat;Functional mobility training;Therapeutic activities;Therapeutic exercise;Patient/family education;Manual techniques;Dry needling    PT Next Visit Plan  cont with POC    Consulted and Agree with Plan of Care  Patient       Patient will benefit from skilled therapeutic intervention in order to improve the following deficits and impairments:  Decreased activity tolerance, Decreased mobility, Decreased strength, Pain  Visit Diagnosis: Chronic left-sided low back pain, with sciatica presence unspecified  Muscle weakness (generalized)     Problem List Patient Active Problem List   Diagnosis Date Noted  . Left lumbar radiculopathy 01/22/2018  . Mild cognitive impairment 10/22/2017  . Anxiety 10/22/2017  . Otitis of left ear 02/01/2017  . Aortic atherosclerosis ([place]) 12/04/2016  . Allergic drug reaction 06/25/2015  . Dehydration with hyponatremia 06/25/2015  . HCAP (healthcare-associated pneumonia) 06/25/2015  . Vitamin D deficiency 08/19/2014  . Family history of colon cancer-father at 65+ grandparent w/ rectal cancer 01/14/2014  . Osteopenia 08/12/2013  . CAD (  coronary artery disease), native coronary artery 03/22/2013  . Aortic valve stenosis, mild 03/22/2013  . Hyperlipemia 03/11/2013  . Hypertension 03/11/2013  . Allergic rhinitis 03/11/2013  . Left shoulder pain 03/11/2013  .  IBS (irritable bowel syndrome) - with chronic recurrent abdominal pain 02/01/2012  . Personal history of colonic polyps - adenoma 02/01/2012  . Coccydynia 02/01/2012    [name] P, PTA 03/05/2018, 2:52 PM  [facility] [address] [place], [place], [address] Phone: [phone]   Fax:  [phone]  Name: Shelly Bennett MRN: [id number] Date of Birth: 03/26/1951

## 2018-03-11 ENCOUNTER — Ambulatory Visit: Payer: Medicare Other | Admitting: Physical Therapy

## 2018-03-11 DIAGNOSIS — M545 Low back pain: Secondary | ICD-10-CM | POA: Diagnosis not present

## 2018-03-11 DIAGNOSIS — G8929 Other chronic pain: Secondary | ICD-10-CM

## 2018-03-11 NOTE — Patient Instructions (Signed)
  Outer Hip Stretch: Reclined IT Band Stretch (Strap)   Strap around opposite foot, pull across only as far as possible with shoulders on mat. Hold for __30__ seconds. Repeat __3__ times each leg. 2-3 x/day.  Lying Side Bow    Lie on your back.. Bow hips to left, flex the shoulders and legs to the right. Hold for __2-5 min. Repeat on other side.  http://yg.exer.us/187   Copyright  VHI. All rights reserved.   Madelyn Flavors, PT 03/11/18 9:45 AM; [facility] [place], [place], [address] Phone: [phone]   Fax:  [phone]

## 2018-03-11 NOTE — Therapy (Signed)
[facility] [place], [place], [address] Phone: [phone]   Fax:  [phone]  Physical Therapy Treatment  Patient Details  Name: Shelly Bennett MRN: [id number] Date of Birth: June 20, 1951 Referring Provider: Marcial Pacas MD.   Encounter Date: 03/11/2018  PT End of Session - 03/11/18 0905    Visit Number  13    Number of Visits  16    Date for PT Re-Evaluation  04/28/18    Authorization Type  FOTO AT LEAST EVERY 5TH VISIT. (next foto visit 16)    PT Start Time  0903    PT Stop Time  1000    PT Time Calculation (min)  57 min    Activity Tolerance  Patient tolerated treatment well    Behavior During Therapy  [facility] for tasks assessed/performed       Past Medical History:  Diagnosis Date  . Adenomatous colon polyp   . Allergy   . Anxiety   . Arthritis   . CAD (coronary artery disease)    Dr. Burt Knack follows   . Chronic headaches   . Cystocele   . Diverticulosis   . External hemorrhoids   . HLD (hyperlipidemia)   . HTN (hypertension)   . IBS (irritable bowel syndrome)   . Memory loss   . Obesity   . Pneumonia   . Skin cancer    squamous cell - face    Past Surgical History:  Procedure Laterality Date  . CARPAL TUNNEL RELEASE    . COLONOSCOPY  12/15/2008   diverticulosis, external hemorrhoids  . KNEE SURGERY  05/22/2015   right  . LAPAROSCOPY    . SKIN SURGERY     squamous cell - right face     There were no vitals filed for this visit.  Subjective Assessment - 03/11/18 0905    Subjective  Patient states she feels pretty good, but still feeling pain down the front of her leg past the knee; both knees today but usually more the left side. She states her butt is better and she can sit a bit longer but the back hurts.    Patient Stated Goals  I want to get out of pain and do things without fear.      Currently in Pain?  Yes    Pain Score  3     Pain Location  Back    Pain Orientation  Left;Lower    Pain Descriptors / Indicators  Sore    Pain Type  Chronic pain    Pain Onset  More than a month ago                No data recorded       [facility] Adult PT Treatment/Exercise - 03/11/18 0001      Lumbar Exercises: Stretches   ITB Stretch  Left;1 rep;20 seconds across body with PT assist    Other Lumbar Stretch Exercise  Supine C stretch for QL and lateral lumbar stretch x 30 seconds      Lumbar Exercises: Prone   Other Prone Lumbar Exercises  prone lying x 2 min centralizes symptoms    Other Prone Lumbar Exercises  POE x 1 min      Modalities   Modalities  Electrical Stimulation;Moist Heat      Moist Heat Therapy   Number Minutes Moist Heat  15 Minutes    Moist Heat Location  Lumbar Spine gluteals      Electrical Stimulation  Electrical Stimulation Location  L QL and gluteals    Electrical Stimulation Action  IFC    Electrical Stimulation Parameters  80-150 Hz x 1 66min    Electrical Stimulation Goals  Pain      Manual Therapy   Manual Therapy  Soft tissue mobilization    Soft tissue mobilization  to left paraspinals, QL, gluteals and piriformis       Trigger Point Dry Needling - 03/11/18 1235    Consent Given?  Yes    Education Handout Provided  No    Muscles Treated Upper Body  Quadratus Lumborum;Longissimus Left; multifidi L3-5    Muscles Treated Lower Body  Gluteus minimus;Gluteus maximus;Piriformis and glut med; all left    Gluteus Maximus Response  Twitch response elicited;Palpable increased muscle length    Gluteus Minimus Response  Twitch response elicited;Palpable increased muscle length    Piriformis Response  Twitch response elicited;Palpable increased muscle length           PT Education - 03/11/18 1236    Education provided  Yes    Education Details  HEP; explanation of centralization of symptoms and goal of extension exercises.    Person(s) Educated  Patient    Methods  Explanation;Demonstration;Handout    Comprehension  Verbalized  understanding;Returned demonstration       PT Short Term Goals - 01/28/18 1237      PT SHORT TERM GOAL #1   Title  STG's=LTG's.        PT Long Term Goals - 02/19/18 1318      PT LONG TERM GOAL #1   Title  Ind with HEP.    Time  4    Period  Weeks    Status  Achieved      PT LONG TERM GOAL #2   Title  Stand 30 minutes with pain not > 3/10.    Time  8    Period  Weeks    Status  On-going      PT LONG TERM GOAL #3   Title  Sit 30 minutes with pain not > 3/10.    Time  8    Period  Weeks    Status  On-going      PT LONG TERM GOAL #4   Title  Sit to stand x 5 with pain not > 2-3/10.    Time  8    Period  Weeks    Status  On-going      PT LONG TERM GOAL #5   Title  Perform ADL's with pain not > 3/10.    Time  8    Period  Weeks    Status  On-going            Plan - 03/11/18 1236    Clinical Impression Statement  Patient presented today with decreased pain overall. She still c/o pain in left low back and intermittent pain into Bil LE to knees. She was able to centralize these symptoms in prone. She had ++ twitch response in L QL. Good response in gluteals. Less TPs noted vs. previous visit with this PT.     PT Treatment/Interventions  ADLs/Self Care Home Management;Cryotherapy;Electrical Stimulation;Ultrasound;Moist Heat;Functional mobility training;Therapeutic activities;Therapeutic exercise;Patient/family education;Manual techniques;Dry needling    PT Next Visit Plan  work on extension protocol, stretches for left lumbar    PT Home Exercise Plan  C stetch and ITB stretch with strap       Patient will benefit from skilled therapeutic intervention in order  to improve the following deficits and impairments:  Decreased activity tolerance, Decreased mobility, Decreased strength, Pain  Visit Diagnosis: Chronic left-sided low back pain, with sciatica presence unspecified     Problem List Patient Active Problem List   Diagnosis Date Noted  . Left lumbar  radiculopathy 01/22/2018  . Mild cognitive impairment 10/22/2017  . Anxiety 10/22/2017  . Otitis of left ear 02/01/2017  . Aortic atherosclerosis ([place]) 12/04/2016  . Allergic drug reaction 06/25/2015  . Dehydration with hyponatremia 06/25/2015  . HCAP (healthcare-associated pneumonia) 06/25/2015  . Vitamin D deficiency 08/19/2014  . Family history of colon cancer-father at 50+ grandparent w/ rectal cancer 01/14/2014  . Osteopenia 08/12/2013  . CAD (coronary artery disease), native coronary artery 03/22/2013  . Aortic valve stenosis, mild 03/22/2013  . Hyperlipemia 03/11/2013  . Hypertension 03/11/2013  . Allergic rhinitis 03/11/2013  . Left shoulder pain 03/11/2013  . IBS (irritable bowel syndrome) - with chronic recurrent abdominal pain 02/01/2012  . Personal history of colonic polyps - adenoma 02/01/2012  . Coccydynia 02/01/2012    Madelyn Flavors PT 03/11/2018, 12:44 PM  [facility] [address] [place], [place], [address] Phone: [phone]   Fax:  [phone]  Name: Shelly Bennett MRN: [id number] Date of Birth: [birth year]

## 2018-03-14 ENCOUNTER — Encounter: Payer: Medicare Other | Admitting: Physical Therapy

## 2018-03-18 ENCOUNTER — Ambulatory Visit: Payer: Medicare Other | Attending: Neurology | Admitting: Physical Therapy

## 2018-03-18 DIAGNOSIS — M6281 Muscle weakness (generalized): Secondary | ICD-10-CM | POA: Insufficient documentation

## 2018-03-18 DIAGNOSIS — G8929 Other chronic pain: Secondary | ICD-10-CM | POA: Diagnosis present

## 2018-03-18 DIAGNOSIS — M545 Low back pain: Secondary | ICD-10-CM | POA: Diagnosis present

## 2018-03-18 NOTE — Therapy (Signed)
[facility] [place], [place], [address] Phone: [phone]   Fax:  [phone]  Physical Therapy Treatment  Patient Details  Name: Shelly Bennett MRN: [id number] Date of Birth: May 27, 1951 Referring Provider: Marcial Pacas MD.   Encounter Date: 03/18/2018  PT End of Session - 03/18/18 1358    Visit Number  14    Number of Visits  16    Date for PT Re-Evaluation  04/28/18    Authorization Type  FOTO AT LEAST EVERY 5TH VISIT. (next foto visit 16)    PT Start Time  1302    PT Stop Time  1412 prolonged self care discussion    PT Time Calculation (min)  70 min    Activity Tolerance  Patient tolerated treatment well    Behavior During Therapy  WFL for tasks assessed/performed       Past Medical History:  Diagnosis Date  . Adenomatous colon polyp   . Allergy   . Anxiety   . Arthritis   . CAD (coronary artery disease)    Dr. Burt Knack follows   . Chronic headaches   . Cystocele   . Diverticulosis   . External hemorrhoids   . HLD (hyperlipidemia)   . HTN (hypertension)   . IBS (irritable bowel syndrome)   . Memory loss   . Obesity   . Pneumonia   . Skin cancer    squamous cell - face    Past Surgical History:  Procedure Laterality Date  . CARPAL TUNNEL RELEASE    . COLONOSCOPY  12/15/2008   diverticulosis, external hemorrhoids  . KNEE SURGERY  05/22/2015   right  . LAPAROSCOPY    . SKIN SURGERY     squamous cell - right face     There were no vitals filed for this visit.  Subjective Assessment - 03/18/18 1307    Subjective  Patient felt good until Saturday with pain all the way up her back bilaterally and could hardly move. She had her mother the day before. She is better today overall. She can sit to stand better and get around better overall.     Patient Stated Goals  I want to get out of pain and do things without fear.      Currently in Pain?  Yes    Pain Score  2     Pain Location  Back    Pain  Orientation  Left;Lower    Pain Descriptors / Indicators  Sore    Pain Type  Chronic pain    Pain Radiating Towards  left buttock    Pain Onset  More than a month ago    Pain Frequency  Intermittent    Aggravating Factors   bending, walking with mom in walker, toileting    Pain Relieving Factors  standing, prone lying                       [facility] Adult PT Treatment/Exercise - 03/18/18 0001      Self-Care   Self-Care  ADL's    ADL's  Discussed patient's care of mother and body mechanics; ways to modify such as using a bedside commode    Other Self-Care Comments   discussed ongoing need to decompress spine via prone lying prior and after bending/lifitng activities.      Lumbar Exercises: Stretches   Hip Flexor Stretch  Right;Left;2 reps;30 seconds supine and standing with knee on chair    Standing Extension  5 reps slow with gait belt for overpressure      Lumbar Exercises: Standing   Other Standing Lumbar Exercises  extension x 10      Lumbar Exercises: Prone   Straight Leg Raise  5 reps;Limitations Bil    Straight Leg Raises Limitations  LLE weakness reported; unable to do mule kick without pain    Other Prone Lumbar Exercises  prone lying x 2 min; abolishes buttock pain     Other Prone Lumbar Exercises  pelvic press series 10 reps each bil      Modalities   Modalities  Electrical Stimulation;Moist Heat      Moist Heat Therapy   Number Minutes Moist Heat  15 Minutes    Moist Heat Location  Lumbar Spine      Electrical Stimulation   Electrical Stimulation Location  Bil lumbar gluteals IFC 80-150 Hz x 15 min    Electrical Stimulation Goals  Pain      Manual Therapy   Manual Therapy  Joint mobilization    Manual therapy comments  Repeated PA mobs to L2/3 to L5/S1 in prone gd II/III    Joint Mobilization  POE with PA mobs Bil and unilateral; pain with L4/5 and L5/S1 Bil; worst is at L4/5               PT Short Term Goals - 01/28/18 1237      PT  SHORT TERM GOAL #1   Title  STG's=LTG's.        PT Long Term Goals - 02/19/18 1318      PT LONG TERM GOAL #1   Title  Ind with HEP.    Time  4    Period  Weeks    Status  Achieved      PT LONG TERM GOAL #2   Title  Stand 30 minutes with pain not > 3/10.    Time  8    Period  Weeks    Status  On-going      PT LONG TERM GOAL #3   Title  Sit 30 minutes with pain not > 3/10.    Time  8    Period  Weeks    Status  On-going      PT LONG TERM GOAL #4   Title  Sit to stand x 5 with pain not > 2-3/10.    Time  8    Period  Weeks    Status  On-going      PT LONG TERM GOAL #5   Title  Perform ADL's with pain not > 3/10.    Time  8    Period  Weeks    Status  On-going            Plan - 03/18/18 1408    Clinical Impression Statement  Patient reports relief with PT than increased pain after caring for her mother who requires max A of 1 with transfers, toileting and walking. Patient is able to centralize sx with prone lying and POE, but due to shoulder pain cannot to prone press ups. She has marked tightness of L HF and mod of Right contributing to her back pain. She responded well to stretches. While she responds well to DN unless patient can modify caregiving body mechanics symptoms will likely continue.    PT Treatment/Interventions  ADLs/Self Care Home Management;Cryotherapy;Electrical Stimulation;Ultrasound;Moist Heat;Functional mobility training;Therapeutic activities;Therapeutic exercise;Patient/family education;Manual techniques;Dry needling    PT Next Visit Plan  work on extension protocol,  stretches for hip flexors, prone pelvic press series       Patient will benefit from skilled therapeutic intervention in order to improve the following deficits and impairments:  Decreased activity tolerance, Decreased mobility, Decreased strength, Pain  Visit Diagnosis: Chronic left-sided low back pain, with sciatica presence unspecified  Muscle weakness  (generalized)     Problem List Patient Active Problem List   Diagnosis Date Noted  . Left lumbar radiculopathy 01/22/2018  . Mild cognitive impairment 10/22/2017  . Anxiety 10/22/2017  . Otitis of left ear 02/01/2017  . Aortic atherosclerosis ([place]) 12/04/2016  . Allergic drug reaction 06/25/2015  . Dehydration with hyponatremia 06/25/2015  . HCAP (healthcare-associated pneumonia) 06/25/2015  . Vitamin D deficiency 08/19/2014  . Family history of colon cancer-father at 79+ grandparent w/ rectal cancer 01/14/2014  . Osteopenia 08/12/2013  . CAD (coronary artery disease), native coronary artery 03/22/2013  . Aortic valve stenosis, mild 03/22/2013  . Hyperlipemia 03/11/2013  . Hypertension 03/11/2013  . Allergic rhinitis 03/11/2013  . Left shoulder pain 03/11/2013  . IBS (irritable bowel syndrome) - with chronic recurrent abdominal pain 02/01/2012  . Personal history of colonic polyps - adenoma 02/01/2012  . Coccydynia 02/01/2012    Madelyn Flavors PT 03/18/2018, 2:21 PM  [facility] [address] [place], [place], [address] Phone: [phone]   Fax:  [phone]  Name: Sydnee Lamour MRN: [id number] Date of Birth: 1951/06/02

## 2018-03-18 NOTE — Patient Instructions (Signed)
Pelvic Press  [email or web address]   Place hands under belly between navel and pubic bone, palms up. Feel pressure on hands. Increase pressure on hands by pressing pelvis down. This is NOT a pelvic tilt. Hold __5_ seconds. Relax. Repeat _10__ times. Once a day.  KNEE: Flexion - Prone   Hold pelvic press. Bend knee. Raise heel toward buttocks. Repeat on opposite leg. Do not raise hips. _10__ reps per set. When this is mastered, pull both heels up at same time, x 10 reps.  Once a day   Hip Extension (Prone)  Hold pelvic press  Lift left leg _3___ inches from floor, keeping knee locked. Repeat __10__ times per set. Do _1___ sets per session. Do _10___ sessions per day.    Hip Flexor Stretch Do in standing/May use a chair to put back leg on.   Kneel as shown. Interlace fingers on top of right knee. Keeping trunk straight and contracting abdominal muscles, slowly shift weight forward. Continue breathing normally and hold position for 30 -60 seconds. Repeat on other leg. Alternate sides _3__ times. Do _2-3__ times per day.   Shelly Bennett, PT 03/18/18 1:46 PM [facility] [place], [place], [address] Phone: [phone]   Fax:  [phone]

## 2018-03-25 ENCOUNTER — Ambulatory Visit: Payer: Medicare Other | Admitting: Physical Therapy

## 2018-03-25 DIAGNOSIS — M545 Low back pain: Principal | ICD-10-CM

## 2018-03-25 DIAGNOSIS — M6281 Muscle weakness (generalized): Secondary | ICD-10-CM

## 2018-03-25 DIAGNOSIS — G8929 Other chronic pain: Secondary | ICD-10-CM

## 2018-03-25 NOTE — Therapy (Signed)
[facility] [place], [place], [address] Phone: [phone]   Fax:  [phone]  Physical Therapy Treatment  Patient Details  Name: Shelly Bennett MRN: [id number] Date of Birth: 12-04-1951 Referring Provider: Marcial Pacas MD.   Encounter Date: 03/25/2018  PT End of Session - 03/25/18 0903    Visit Number  15    Number of Visits  16    Date for PT Re-Evaluation  04/28/18    Authorization Type  FOTO AT LEAST EVERY 5TH VISIT. (next foto visit 16)    PT Start Time  0902    PT Stop Time  0956    PT Time Calculation (min)  54 min    Activity Tolerance  Patient tolerated treatment well    Behavior During Therapy  [facility] for tasks assessed/performed       Past Medical History:  Diagnosis Date  . Adenomatous colon polyp   . Allergy   . Anxiety   . Arthritis   . CAD (coronary artery disease)    Dr. Burt Knack follows   . Chronic headaches   . Cystocele   . Diverticulosis   . External hemorrhoids   . HLD (hyperlipidemia)   . HTN (hypertension)   . IBS (irritable bowel syndrome)   . Memory loss   . Obesity   . Pneumonia   . Skin cancer    squamous cell - face    Past Surgical History:  Procedure Laterality Date  . CARPAL TUNNEL RELEASE    . COLONOSCOPY  12/15/2008   diverticulosis, external hemorrhoids  . KNEE SURGERY  05/22/2015   right  . LAPAROSCOPY    . SKIN SURGERY     squamous cell - right face     There were no vitals filed for this visit.  Subjective Assessment - 03/25/18 0903    Subjective  Patient feeling "pretty good". She had to care for her mother yesterday so she's hurting a little today.    Patient Stated Goals  I want to get out of pain and do things without fear.      Currently in Pain?  Yes    Pain Score  3     Pain Location  Back    Pain Orientation  Lower    Pain Descriptors / Indicators  Sore    Pain Type  Chronic pain    Pain Radiating Towards  left buttock                        [facility] Adult PT Treatment/Exercise - 03/25/18 0001      Lumbar Exercises: Supine   Bridge  Non-compliant;10 reps;5 seconds    Other Supine Lumbar Exercises  ab set with resisted marching with yellow band around feet 2 x 10 bil explanation of TA muscle and hip flexor anatomy       Lumbar Exercises: Prone   Straight Leg Raise  10 reps    Other Prone Lumbar Exercises  prone lying x 3 min abolishes all sx.    Other Prone Lumbar Exercises  pelvic press series 10 reps each bil right hip flex pulls on her back; left pulls in quad      Modalities   Modalities  Electrical Stimulation      Electrical Stimulation   Electrical Stimulation Location  Bil lumbar gluteals IFC 80-150 Hz x 15 min    Electrical Stimulation Goals  Pain  PT Short Term Goals - 01/28/18 1237      PT SHORT TERM GOAL #1   Title  STG's=LTG's.        PT Long Term Goals - 02/19/18 1318      PT LONG TERM GOAL #1   Title  Ind with HEP.    Time  4    Period  Weeks    Status  Achieved      PT LONG TERM GOAL #2   Title  Stand 30 minutes with pain not > 3/10.    Time  8    Period  Weeks    Status  On-going      PT LONG TERM GOAL #3   Title  Sit 30 minutes with pain not > 3/10.    Time  8    Period  Weeks    Status  On-going      PT LONG TERM GOAL #4   Title  Sit to stand x 5 with pain not > 2-3/10.    Time  8    Period  Weeks    Status  On-going      PT LONG TERM GOAL #5   Title  Perform ADL's with pain not > 3/10.    Time  8    Period  Weeks    Status  On-going            Plan - 03/25/18 1232    Clinical Impression Statement  Patient did very well with extension biased exercise today. She demonstrates a solid TA contraction with TE and even shows small improvements with prone hip ext.    PT Treatment/Interventions  ADLs/Self Care Home Management;Cryotherapy;Electrical Stimulation;Ultrasound;Moist Heat;Functional mobility training;Therapeutic  activities;Therapeutic exercise;Patient/family education;Manual techniques;Dry needling    PT Next Visit Plan  FOTO; finalize HEP with extension biased exercises.    PT Home Exercise Plan  C stetch and ITB stretch with strap, supine marching with band       Patient will benefit from skilled therapeutic intervention in order to improve the following deficits and impairments:  Decreased activity tolerance, Decreased mobility, Decreased strength, Pain  Visit Diagnosis: Chronic left-sided low back pain, with sciatica presence unspecified  Muscle weakness (generalized)     Problem List Patient Active Problem List   Diagnosis Date Noted  . Left lumbar radiculopathy 01/22/2018  . Mild cognitive impairment 10/22/2017  . Anxiety 10/22/2017  . Otitis of left ear 02/01/2017  . Aortic atherosclerosis ([place]) 12/04/2016  . Allergic drug reaction 06/25/2015  . Dehydration with hyponatremia 06/25/2015  . HCAP (healthcare-associated pneumonia) 06/25/2015  . Vitamin D deficiency 08/19/2014  . Family history of colon cancer-father at 69+ grandparent w/ rectal cancer 01/14/2014  . Osteopenia 08/12/2013  . CAD (coronary artery disease), native coronary artery 03/22/2013  . Aortic valve stenosis, mild 03/22/2013  . Hyperlipemia 03/11/2013  . Hypertension 03/11/2013  . Allergic rhinitis 03/11/2013  . Left shoulder pain 03/11/2013  . IBS (irritable bowel syndrome) - with chronic recurrent abdominal pain 02/01/2012  . Personal history of colonic polyps - adenoma 02/01/2012  . Coccydynia 02/01/2012    Shelly Bennett PT 03/25/2018, 12:36 PM  [facility] [address] [place], [place], [address] Phone: [phone]   Fax:  [phone]  Name: Shelly Bennett MRN: [id number] Date of Birth: March 03, 1951

## 2018-04-01 ENCOUNTER — Encounter: Payer: Self-pay | Admitting: Physical Therapy

## 2018-04-01 ENCOUNTER — Ambulatory Visit: Payer: Medicare Other | Admitting: Physical Therapy

## 2018-04-01 DIAGNOSIS — M545 Low back pain: Secondary | ICD-10-CM | POA: Diagnosis not present

## 2018-04-01 DIAGNOSIS — G8929 Other chronic pain: Secondary | ICD-10-CM

## 2018-04-01 DIAGNOSIS — M6281 Muscle weakness (generalized): Secondary | ICD-10-CM

## 2018-04-01 NOTE — Therapy (Signed)
[facility] [place], [place], [address] Phone: [phone]   Fax:  [phone]  Physical Therapy Treatment  Patient Details  Name: Shelly Bennett MRN: [id number] Date of Birth: [birth year] Referring Provider: Marcial Pacas MD.   Encounter Date: 04/01/2018  PT End of Session - 04/01/18 0958    Visit Number  16    Number of Visits  16    Date for PT Re-Evaluation  04/28/18    Authorization Type  FOTO AT LEAST EVERY 5TH VISIT. (next foto visit 16)    PT Start Time  0904    Activity Tolerance  Patient tolerated treatment well    Behavior During Therapy  [facility] for tasks assessed/performed       Past Medical History:  Diagnosis Date  . Adenomatous colon polyp   . Allergy   . Anxiety   . Arthritis   . CAD (coronary artery disease)    Dr. Burt Knack follows   . Chronic headaches   . Cystocele   . Diverticulosis   . External hemorrhoids   . HLD (hyperlipidemia)   . HTN (hypertension)   . IBS (irritable bowel syndrome)   . Memory loss   . Obesity   . Pneumonia   . Skin cancer    squamous cell - face    Past Surgical History:  Procedure Laterality Date  . CARPAL TUNNEL RELEASE    . COLONOSCOPY  12/15/2008   diverticulosis, external hemorrhoids  . KNEE SURGERY  05/22/2015   right  . LAPAROSCOPY    . SKIN SURGERY     squamous cell - right face     There were no vitals filed for this visit.  Subjective Assessment - 04/01/18 0958    Subjective  I do great after treatment but then I lift my Mother and it makes me hurt again.    Pain Score  3     Pain Location  Back    Pain Orientation  Lower    Pain Descriptors / Indicators  Sore    Pain Onset  More than a month ago                       [facility] Adult PT Treatment/Exercise - 04/01/18 0001      Modalities   Modalities  Electrical Stimulation;Moist Heat;Ultrasound      Moist Heat Therapy   Number Minutes Moist Heat  15 Minutes    Moist Heat  Location  Lumbar Spine      Electrical Stimulation   Electrical Stimulation Location  Left SIJ    Electrical Stimulation Action  Pre-mod.    Electrical Stimulation Parameters  80-150 Hz x 15 minutes.    Electrical Stimulation Goals  Pain      Ultrasound   Ultrasound Location  Left low back/SIJ    Ultrasound Parameters  1.50 W/CM2 x 12 minutes.    Ultrasound Goals  Pain      Manual Therapy   Soft tissue mobilization  STW/M to left SIJ which included left QL release and ischemic release technique x 11 minutes.               PT Short Term Goals - 01/28/18 1237      PT SHORT TERM GOAL #1   Title  STG's=LTG's.        PT Long Term Goals - 04/01/18 1018      PT LONG TERM GOAL #1   Title  Ind  with HEP.    Time  4    Period  Weeks    Status  Achieved      PT LONG TERM GOAL #2   Time  8    Period  Weeks    Status  On-going      PT LONG TERM GOAL #3   Title  Sit 30 minutes with pain not > 3/10.    Time  8    Period  Weeks    Status  On-going      PT LONG TERM GOAL #4   Title  Sit to stand x 5 with pain not > 2-3/10.    Time  8    Period  Weeks    Status  On-going      PT LONG TERM GOAL #5   Title  Perform ADL's with pain not > 3/10.    Time  8    Period  Weeks    Status  On-going            Plan - 04/01/18 1009    Clinical Impression Statement  Overall patient did well with a reduction in her limitation per her FOTO score.    PT Treatment/Interventions  ADLs/Self Care Home Management;Cryotherapy;Electrical Stimulation;Ultrasound;Moist Heat;Functional mobility training;Therapeutic activities;Therapeutic exercise;Patient/family education;Manual techniques;Dry needling       Patient will benefit from skilled therapeutic intervention in order to improve the following deficits and impairments:  Decreased activity tolerance, Decreased mobility, Decreased strength, Pain  Visit Diagnosis: Chronic left-sided low back pain, with sciatica presence  unspecified  Muscle weakness (generalized)     Problem List Patient Active Problem List   Diagnosis Date Noted  . Left lumbar radiculopathy 01/22/2018  . Mild cognitive impairment 10/22/2017  . Anxiety 10/22/2017  . Otitis of left ear 02/01/2017  . Aortic atherosclerosis ([place]) 12/04/2016  . Allergic drug reaction 06/25/2015  . Dehydration with hyponatremia 06/25/2015  . HCAP (healthcare-associated pneumonia) 06/25/2015  . Vitamin D deficiency 08/19/2014  . Family history of colon cancer-father at 36+ grandparent w/ rectal cancer 01/14/2014  . Osteopenia 08/12/2013  . CAD (coronary artery disease), native coronary artery 03/22/2013  . Aortic valve stenosis, mild 03/22/2013  . Hyperlipemia 03/11/2013  . Hypertension 03/11/2013  . Allergic rhinitis 03/11/2013  . Left shoulder pain 03/11/2013  . IBS (irritable bowel syndrome) - with chronic recurrent abdominal pain 02/01/2012  . Personal history of colonic polyps - adenoma 02/01/2012  . Coccydynia 02/01/2012   PHYSICAL THERAPY DISCHARGE SUMMARY  Visits from Start of Care: 16.  Current functional level related to goals / functional outcomes: See above.   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   Education / Equipment: HEP.   Plan: Patient agrees to discharge.  Patient goals were partially met. Patient is being discharged due to being pleased with the current functional level.  ?????      Shelly Bennett, [place] MPT 04/01/2018, 10:21 AM  [facility] [address] [place], [place], [address] Phone: [phone]   Fax:  [phone]  Name: Shelly Bennett MRN: [id number] Date of Birth: [birth year]

## 2018-04-16 ENCOUNTER — Encounter (INDEPENDENT_AMBULATORY_CARE_PROVIDER_SITE_OTHER): Payer: Self-pay | Admitting: Orthopaedic Surgery

## 2018-04-16 ENCOUNTER — Ambulatory Visit (INDEPENDENT_AMBULATORY_CARE_PROVIDER_SITE_OTHER): Payer: Self-pay

## 2018-04-16 ENCOUNTER — Ambulatory Visit (INDEPENDENT_AMBULATORY_CARE_PROVIDER_SITE_OTHER): Payer: Medicare Other | Admitting: Orthopaedic Surgery

## 2018-04-16 VITALS — BP 149/77 | HR 63 | Ht 68.0 in | Wt 185.0 lb

## 2018-04-16 DIAGNOSIS — M25561 Pain in right knee: Secondary | ICD-10-CM

## 2018-04-16 NOTE — Progress Notes (Signed)
Office Visit Note   Patient: Shelly Bennett           Date of Birth: 22-May-1951           MRN: [id number] Visit Date: 04/16/2018              Requested by: Chipper Herb, MD [address] [place], [place] [address] PCP: Chipper Herb, MD   Assessment & Plan: Visit Diagnoses:  1. Acute pain of right knee     Plan: Acute onset of right knee pain April 21 when she stumbled but did not fall.  Films demonstrate significant osteoarthritis but no acute injuries.  Suspect she has had an exacerbation of her pre-existing osteoarthritis.  Discussed appropriate medicines possible cortisone injection.  All questions were answered.  We will see back as needed.  Follow-Up Instructions: Return if symptoms worsen or fail to improve.   Orders:  Orders Placed This Encounter  Procedures  . XR KNEE 3 VIEW RIGHT   No orders of the defined types were placed in this encounter.     Procedures: No procedures performed   Clinical Data: No additional findings.   Subjective: Chief Complaint  Patient presents with  . Right Knee - Pain  . New Patient (Initial Visit)    04-06-18 missed 2  steps, didnt fall heard knee crunch, shooting pain down lower leg  Acute onset of right knee pain as mentioned above.  Pain occurred after stumbling but "did not fall."  She felt a "crunch" in her right knee.  Shooting pain in her right lower leg.  No bruising.  No ecchymosis.  No thigh or groin pain.  Not using any ambulatory aid.  HPI  Review of Systems  Constitutional: Negative for fatigue and fever.  HENT: Negative for ear pain.   Eyes: Negative for pain.  Respiratory: Negative for cough and shortness of breath.   Cardiovascular: Positive for leg swelling.  Gastrointestinal: Negative for constipation and diarrhea.  Genitourinary: Negative for difficulty urinating.  Musculoskeletal: Positive for back pain. Negative for neck pain.  Skin: Negative for rash.  Allergic/Immunologic: Negative for  food allergies.  Neurological: Positive for weakness. Negative for numbness.  Hematological: Does not bruise/bleed easily.  Psychiatric/Behavioral: Negative for sleep disturbance.     Objective: Vital Signs: BP (!) 149/77 (BP Location: Left Arm, Patient Position: Sitting, Cuff Size: Normal)   Pulse 63   Ht 5\' 8"  (1.727 m)   Wt 185 lb (83.9 kg)   BMI 28.13 kg/m   Physical Exam  Constitutional: She is oriented to person, place, and time. She appears well-developed and well-nourished.  HENT:  Mouth/Throat: Oropharynx is clear and moist.  Eyes: Pupils are equal, round, and reactive to light. EOM are normal.  Pulmonary/Chest: Effort normal.  Neurological: She is alert and oriented to person, place, and time.  Skin: Skin is warm and dry.  Psychiatric: She has a normal mood and affect. Her behavior is normal.    Ortho Exam awake alert and oriented x3.  Comfortable sitting.  Some pain with weightbearing referable to the right knee both medially and laterally.  Right knee not diffuse.  Some tenderness along the medial lateral compartment.  Crepitation beneath the patella.  No instability.  Full quick range of motion.  No calf pain.  No ankle swelling.  Neurovascular exam intact.  Straight leg raise negative.  No pain with range of motion of right hip.  Specialty Comments:  No specialty comments available.  Imaging: Xr  Knee 3 View Right  Result Date: 04/16/2018 Of the right knee were obtained in 3 projections standing.  No acute changes or evidence of fracture.  Considerable tricompartmental degenerative changes early in the medial and patellofemoral compartments.  There is about 1 degree of varus.  No ectopic calcification.  Distinct irregularity along the joint surface medially with peripheral osteophytes and subchondral sclerosis.  Osteophytes laterally at the patellofemoral joint.  All consistent with end-stage osteoarthritis    PMFS History: Patient Active Problem List   Diagnosis  Date Noted  . Left lumbar radiculopathy 01/22/2018  . Mild cognitive impairment 10/22/2017  . Anxiety 10/22/2017  . Otitis of left ear 02/01/2017  . Aortic atherosclerosis ([place]) 12/04/2016  . Allergic drug reaction 06/25/2015  . Dehydration with hyponatremia 06/25/2015  . HCAP (healthcare-associated pneumonia) 06/25/2015  . Vitamin D deficiency 08/19/2014  . Family history of colon cancer-father at 22+ grandparent w/ rectal cancer 01/14/2014  . Osteopenia 08/12/2013  . CAD (coronary artery disease), native coronary artery 03/22/2013  . Aortic valve stenosis, mild 03/22/2013  . Hyperlipemia 03/11/2013  . Hypertension 03/11/2013  . Allergic rhinitis 03/11/2013  . Left shoulder pain 03/11/2013  . IBS (irritable bowel syndrome) - with chronic recurrent abdominal pain 02/01/2012  . Personal history of colonic polyps - adenoma 02/01/2012  . Coccydynia 02/01/2012   Past Medical History:  Diagnosis Date  . Adenomatous colon polyp   . Allergy   . Anxiety   . Arthritis   . CAD (coronary artery disease)    Dr. Burt Knack follows   . Chronic headaches   . Cystocele   . Diverticulosis   . External hemorrhoids   . HLD (hyperlipidemia)   . HTN (hypertension)   . IBS (irritable bowel syndrome)   . Memory loss   . Obesity   . Pneumonia   . Skin cancer    squamous cell - face    Family History  Problem Relation Age of Onset  . Prostate cancer Father   . Colon cancer Father 70  . Kidney disease Father   . Heart disease Father   . Alzheimer's disease Father   . Hyperlipidemia Mother   . Hypertension Mother   . Kidney disease Mother   . Osteoporosis Mother   . Heart murmur Mother   . Dementia Mother   . Hyperlipidemia Sister   . Hypertension Sister   . Heart disease Brother 41       not dx questionable   . Heart disease Maternal Grandfather     Past Surgical History:  Procedure Laterality Date  . CARPAL TUNNEL RELEASE    . COLONOSCOPY  12/15/2008   diverticulosis, external  hemorrhoids  . KNEE SURGERY  05/22/2015   right  . LAPAROSCOPY    . SKIN SURGERY     squamous cell - right face    Social History   Occupational History  . Occupation: Pharmacist, hospital retired  Tobacco Use  . Smoking status: Never Smoker  . Smokeless tobacco: Never Used  Substance and Sexual Activity  . Alcohol use: No  . Drug use: No  . Sexual activity: Not Currently    Birth control/protection: None

## 2018-04-17 ENCOUNTER — Ambulatory Visit (INDEPENDENT_AMBULATORY_CARE_PROVIDER_SITE_OTHER): Payer: Medicare Other | Admitting: Orthopaedic Surgery

## 2018-04-25 ENCOUNTER — Other Ambulatory Visit: Payer: Self-pay | Admitting: Family Medicine

## 2018-05-19 ENCOUNTER — Ambulatory Visit (INDEPENDENT_AMBULATORY_CARE_PROVIDER_SITE_OTHER): Payer: Medicare Other

## 2018-05-19 ENCOUNTER — Encounter: Payer: Self-pay | Admitting: Family Medicine

## 2018-05-19 ENCOUNTER — Ambulatory Visit: Payer: Medicare Other | Admitting: Family Medicine

## 2018-05-19 VITALS — BP 177/78 | HR 55 | Temp 97.2°F | Ht 68.0 in | Wt 192.0 lb

## 2018-05-19 DIAGNOSIS — E559 Vitamin D deficiency, unspecified: Secondary | ICD-10-CM

## 2018-05-19 DIAGNOSIS — I2510 Atherosclerotic heart disease of native coronary artery without angina pectoris: Secondary | ICD-10-CM | POA: Diagnosis not present

## 2018-05-19 DIAGNOSIS — E7800 Pure hypercholesterolemia, unspecified: Secondary | ICD-10-CM | POA: Diagnosis not present

## 2018-05-19 DIAGNOSIS — I10 Essential (primary) hypertension: Secondary | ICD-10-CM | POA: Diagnosis not present

## 2018-05-19 DIAGNOSIS — M25532 Pain in left wrist: Secondary | ICD-10-CM

## 2018-05-19 DIAGNOSIS — R350 Frequency of micturition: Secondary | ICD-10-CM

## 2018-05-19 DIAGNOSIS — K219 Gastro-esophageal reflux disease without esophagitis: Secondary | ICD-10-CM

## 2018-05-19 LAB — MICROSCOPIC EXAMINATION
Bacteria, UA: NONE SEEN
Epithelial Cells (non renal): NONE SEEN /hpf (ref 0–10)

## 2018-05-19 LAB — URINALYSIS, COMPLETE
BILIRUBIN UA: NEGATIVE
GLUCOSE, UA: NEGATIVE
Ketones, UA: NEGATIVE
Leukocytes, UA: NEGATIVE
NITRITE UA: NEGATIVE
PH UA: 7 (ref 5.0–7.5)
PROTEIN UA: NEGATIVE
Specific Gravity, UA: 1.02 (ref 1.005–1.030)
UUROB: 0.2 mg/dL (ref 0.2–1.0)

## 2018-05-19 MED ORDER — FLUTICASONE PROPIONATE 50 MCG/ACT NA SUSP: 2.0000 | Freq: Every day | NASAL | 3 refills | Status: DC

## 2018-05-19 NOTE — Patient Instructions (Addendum)
Medicare Annual Wellness Visit  [facility] and the medical providers at [facility] strive to bring you the best medical care.  In doing so we not only want to address your current medical conditions and concerns but also to detect new conditions early and prevent illness, disease and health-related problems.    Medicare offers a yearly Wellness Visit which allows our clinical staff to assess your need for preventative services including immunizations, lifestyle education, counseling to decrease risk of preventable diseases and screening for fall risk and other medical concerns.    This visit is provided free of charge (no copay) for all Medicare recipients. The clinical pharmacists at [facility] have begun to conduct these Wellness Visits which will also include a thorough review of all your medications.    As you primary medical provider recommend that you make an appointment for your Annual Wellness Visit if you have not done so already this year.  You may set up this appointment before you leave today or you may call back ([phone]) and schedule an appointment.  Please make sure when you call that you mention that you are scheduling your Annual Wellness Visit with the clinical pharmacist so that the appointment may be made for the proper length of time.     Continue current medications. Continue good therapeutic lifestyle changes which include good diet and exercise. Fall precautions discussed with patient. If an FOBT was given today- please return it to our front desk. If you are over 68 years old - you may need Prevnar 21 or the adult Pneumonia vaccine.  **Flu shots are available--- please call and schedule a FLU-CLINIC appointment**  After your visit with [place] today you will receive a survey in the mail or online from [employer] regarding your care with [place]. Please take a moment to fill this out. Your feedback is very  important to [place] as you can help [place] better understand your patient needs as well as improve your experience and satisfaction. WE CARE ABOUT YOU!!!   Continue to follow-up with the cardiologist and the gynecologist as planned Patient is due to get a repeat colonoscopy in January 2020 because of colon cancer in her father. The cause of the change in her stools we will give her another FOBT to return We will get lab work today and we will also get a uric acid because of the swelling in the left wrist and the pain and tenderness in the left wrist.

## 2018-05-19 NOTE — Progress Notes (Signed)
Subjective:    Patient ID: Shelly Bennett, female    DOB: 04/01/1951, 67 y.o.   MRN: [id number]  HPI Pt here for follow up and management of chronic medical problems which includes hypertension and hyperlipidemia. She is taking medication regularly.  The patient is doing well overall.  Unfortunately she just lost her mother who is 17 years old who is been on hospice for a couple weeks.  She complains today of left lower arm pain and a foul-smelling urine.  She is up-to-date on her Pap smears and mammograms.  She is also had a DEXA scan in January of this year and a chest x-ray.  She did an FOBT in October.  Her vital signs are stable except her blood pressure was slightly elevated.  Patient says she is just fatigued and fuzzy right now after all she is been through the past couple weeks with her mom.  She denies any chest pain or shortness of breath anymore than usual.  She does see Dr. Burt Knack her cardiologist on a yearly basis.  She denies any trouble with her GI tract other than mentioning that her stools have been more loose recently.  There is a family history of colon cancer and she will be due to get her colonoscopy in January 2020.  She does have some increased frequency with voiding but no blood in the urine or pain that she is aware.  She is requesting a refill on the Flonase.     Patient Active Problem List   Diagnosis Date Noted  . Left lumbar radiculopathy 01/22/2018  . Mild cognitive impairment 10/22/2017  . Anxiety 10/22/2017  . Otitis of left ear 02/01/2017  . Aortic atherosclerosis ([place]) 12/04/2016  . Allergic drug reaction 06/25/2015  . Dehydration with hyponatremia 06/25/2015  . HCAP (healthcare-associated pneumonia) 06/25/2015  . Vitamin D deficiency 08/19/2014  . Family history of colon cancer-father at 57+ grandparent w/ rectal cancer 01/14/2014  . Osteopenia 08/12/2013  . CAD (coronary artery disease), native coronary artery 03/22/2013  . Aortic valve stenosis,  mild 03/22/2013  . Hyperlipemia 03/11/2013  . Hypertension 03/11/2013  . Allergic rhinitis 03/11/2013  . Left shoulder pain 03/11/2013  . IBS (irritable bowel syndrome) - with chronic recurrent abdominal pain 02/01/2012  . Personal history of colonic polyps - adenoma 02/01/2012  . Coccydynia 02/01/2012   Outpatient Encounter Medications as of 05/19/2018  Medication Sig  . albuterol (PROVENTIL HFA;VENTOLIN HFA) 108 (90 Base) MCG/ACT inhaler Inhale 2 puffs into the lungs every 6 (six) hours as needed for wheezing or shortness of breath.  Marland Kitchen amLODipine (NORVASC) 10 MG tablet Take 0.5 tablets (5 mg total) by mouth daily.  Marland Kitchen aspirin 81 MG tablet Take 81 mg by mouth daily.   . busPIRone (BUSPAR) 15 MG tablet Take 1 tablet (15 mg total) by mouth 2 (two) times daily as needed.  . calcium carbonate 200 MG capsule Take 250 mg by mouth 2 (two) times daily.   . Cholecalciferol (VITAMIN D-3) 5000 UNITS TABS Take 1 capsule by mouth as directed. Take one capsule by mouth daily Mon thru Friday  . fexofenadine (ALLEGRA) 180 MG tablet Take 180 mg by mouth daily.  . fluticasone (FLONASE) 50 MCG/ACT nasal spray Place 2 sprays into both nostrils daily.  . fluticasone furoate-vilanterol (BREO ELLIPTA) 100-25 MCG/INH AEPB Inhale 1 puff into the lungs daily as needed (wheezing).  . hydrochlorothiazide (HYDRODIURIL) 25 MG tablet Take 0.5 tablets (12.5 mg total) by mouth daily.  Marland Kitchen HYDROcodone-acetaminophen (NORCO/VICODIN) 5-325  MG per tablet Take 0.5 tablets by mouth at bedtime as needed for moderate pain.   Marland Kitchen omega-3 acid ethyl esters (LOVAZA) 1 g capsule TAKE (1) CAPSULE FOUR TIMES DAILY.  Marland Kitchen omeprazole (PRILOSEC) 20 MG capsule Take 20 mg by mouth daily.  Marland Kitchen PARoxetine (PAXIL) 20 MG tablet Take 20 mg by mouth daily.  . ranitidine (ZANTAC) 150 MG tablet Take 150 mg by mouth at bedtime.  . rosuvastatin (CRESTOR) 40 MG tablet Take 1 tablet (40 mg total) by mouth daily.  . [DISCONTINUED] DULoxetine (CYMBALTA) 60 MG  capsule Take 1 capsule (60 mg total) by mouth daily.  . [DISCONTINUED] mometasone (NASONEX) 50 MCG/ACT nasal spray 2 SPRAYS IN EACH NOSTRIL ONCE A DAY   No facility-administered encounter medications on file as of 05/19/2018.      Review of Systems  Constitutional: Negative.   HENT: Negative.   Eyes: Negative.   Respiratory: Negative.   Cardiovascular: Negative.   Gastrointestinal: Negative.   Endocrine: Negative.   Genitourinary: Negative.        Odor to urine  Musculoskeletal: Positive for arthralgias (left lower arm pain ).  Skin: Negative.   Allergic/Immunologic: Negative.   Neurological: Negative.   Hematological: Negative.   Psychiatric/Behavioral: Negative.        Objective:   Physical Exam  Constitutional: She is oriented to person, place, and time. She appears well-developed and well-nourished.  She is somewhat tearful but thankful she has had her mother for as long as she had her  HENT:  Head: Normocephalic and atraumatic.  Right Ear: External ear normal.  Left Ear: External ear normal.  Mouth/Throat: Oropharynx is clear and moist. No oropharyngeal exudate.  Nasal turbinate congestion bilaterally  Eyes: Pupils are equal, round, and reactive to light. Conjunctivae and EOM are normal. Right eye exhibits no discharge. Left eye exhibits no discharge.  Neck: Normal range of motion. Neck supple. No thyromegaly present.  No bruits thyromegaly or anterior cervical adenopathy  Cardiovascular: Normal rate, regular rhythm, normal heart sounds and intact distal pulses.  No murmur heard. Heart is regular at 60/min  Pulmonary/Chest: Effort normal and breath sounds normal. She has no wheezes. She has no rales.  Clear anteriorly and posteriorly  Abdominal: Soft. Bowel sounds are normal. She exhibits no mass. There is no tenderness. There is no rebound and no guarding.  No epigastric or suprapubic tenderness detected.  No liver or spleen enlargement no masses no bruits    Musculoskeletal: Normal range of motion. She exhibits edema and tenderness. She exhibits no deformity.  The left wrist was more swollen and more tender bilaterally than the right wrist.  Lymphadenopathy:    She has no cervical adenopathy.  Neurological: She is alert and oriented to person, place, and time. She has normal reflexes. No cranial nerve deficit.  Skin: Skin is warm and dry.  Psychiatric: She has a normal mood and affect. Her behavior is normal. Judgment and thought content normal.  Patient is somewhat sad and tearful today but thankful for having had her mother for so long.  Nursing note and vitals reviewed.   BP (!) 147/76 (BP Location: Left Arm)   Pulse (!) 55   Temp (!) 97.2 F (36.2 C) (Oral)   Ht '5\' 8"'$  (1.727 m)   Wt 192 lb (87.1 kg)   BMI 29.19 kg/m   Chest x-ray with results pending===      Assessment & Plan:  1. Pure hypercholesterolemia -Continue current treatment and try to begin walking and continue  to drink plenty of water - CBC with Differential/Platelet - Lipid panel  2. Hypertension -[place]de pressure is slightly elevated today and this will be  rechecked before she leaves the office - BMP8+EGFR - CBC with Differential/Platelet - Hepatic function panel  3. Vitamin D deficiency -Continue current treatment pending results of lab work - CBC with Differential/Platelet - VITAMIN D 25 Hydroxy (Vit-D Deficiency, Fractures)  4. Arteriosclerotic cardiovascular disease (ASCVD) -Continue statin treatment and follow-up with cardiology as planned - CBC with Differential/Platelet  5. Gastroesophageal reflux disease, esophagitis presence not specified -Continue with omeprazole and follow diet of avoiding irritating foods - CBC with Differential/Platelet  6. Left wrist pain - DG Wrist Complete Left; Future - Uric acid  7.  Urinary frequency -Check urinalysis  Meds ordered this encounter  Medications  . fluticasone (FLONASE) 50 MCG/ACT nasal spray     Sig: Place 2 sprays into both nostrils daily.    Dispense:  16 g    Refill:  3   Patient Instructions                       Medicare Annual Wellness Visit  [facility] and the medical providers at [facility] strive to bring you the best medical care.  In doing so we not only want to address your current medical conditions and concerns but also to detect new conditions early and prevent illness, disease and health-related problems.    Medicare offers a yearly Wellness Visit which allows our clinical staff to assess your need for preventative services including immunizations, lifestyle education, counseling to decrease risk of preventable diseases and screening for fall risk and other medical concerns.    This visit is provided free of charge (no copay) for all Medicare recipients. The clinical pharmacists at [facility] have begun to conduct these Wellness Visits which will also include a thorough review of all your medications.    As you primary medical provider recommend that you make an appointment for your Annual Wellness Visit if you have not done so already this year.  You may set up this appointment before you leave today or you may call back ([phone]) and schedule an appointment.  Please make sure when you call that you mention that you are scheduling your Annual Wellness Visit with the clinical pharmacist so that the appointment may be made for the proper length of time.     Continue current medications. Continue good therapeutic lifestyle changes which include good diet and exercise. Fall precautions discussed with patient. If an FOBT was given today- please return it to our front desk. If you are over 49 years old - you may need Prevnar 41 or the adult Pneumonia vaccine.  **Flu shots are available--- please call and schedule a FLU-CLINIC appointment**  After your visit with [place] today you will receive a survey in the mail or online from  [employer] regarding your care with [place]. Please take a moment to fill this out. Your feedback is very important to [place] as you can help [place] better understand your patient needs as well as improve your experience and satisfaction. WE CARE ABOUT YOU!!!   Continue to follow-up with the cardiologist and the gynecologist as planned Patient is due to get a repeat colonoscopy in January 2020 because of colon cancer in her father. The cause of the change in her stools we will give her another FOBT to return We will get lab work today and  we will also get a uric acid because of the swelling in the left wrist and the pain and tenderness in the left wrist.    Arrie Senate MD

## 2018-05-19 NOTE — Addendum Note (Signed)
Addended by: Zannie Cove on: 05/19/2018 11:31 AM   Modules accepted: Orders

## 2018-05-20 LAB — HEPATIC FUNCTION PANEL
ALBUMIN: 4.5 g/dL (ref 3.6–4.8)
ALT: 17 IU/L (ref 0–32)
AST: 24 IU/L (ref 0–40)
Alkaline Phosphatase: 80 IU/L (ref 39–117)
BILIRUBIN TOTAL: 0.5 mg/dL (ref 0.0–1.2)
Bilirubin, Direct: 0.1 mg/dL (ref 0.00–0.40)
Total Protein: 6.6 g/dL (ref 6.0–8.5)

## 2018-05-20 LAB — LIPID PANEL
CHOLESTEROL TOTAL: 165 mg/dL (ref 100–199)
Chol/HDL Ratio: 2.5 ratio (ref 0.0–4.4)
HDL: 66 mg/dL (ref >39)
LDL Calculated: 74 mg/dL (ref 0–99)
TRIGLYCERIDES: 124 mg/dL (ref 0–149)
VLDL Cholesterol Cal: 25 mg/dL (ref 5–40)

## 2018-05-20 LAB — CBC WITH DIFFERENTIAL/PLATELET
Basophils Absolute: 0 10*3/uL (ref 0.0–0.2)
Basos: 0 %
EOS (ABSOLUTE): 0.1 10*3/uL (ref 0.0–0.4)
EOS: 2 %
Hematocrit: 41.9 % (ref 34.0–46.6)
Hemoglobin: 13.8 g/dL (ref 11.1–15.9)
IMMATURE GRANULOCYTES: 0 %
Immature Grans (Abs): 0 10*3/uL (ref 0.0–0.1)
Lymphocytes Absolute: 1.6 10*3/uL (ref 0.7–3.1)
Lymphs: 26 %
MCH: 29.9 pg (ref 26.6–33.0)
MCHC: 32.9 g/dL (ref 31.5–35.7)
MCV: 91 fL (ref 79–97)
MONOS ABS: 0.3 10*3/uL (ref 0.1–0.9)
Monocytes: 6 %
NEUTROS PCT: 66 %
Neutrophils Absolute: 4 10*3/uL (ref 1.4–7.0)
PLATELETS: 292 10*3/uL (ref 150–450)
RBC: 4.62 x10E6/uL (ref 3.77–5.28)
RDW: 14 % (ref 12.3–15.4)
WBC: 6.1 10*3/uL (ref 3.4–10.8)

## 2018-05-20 LAB — BMP8+EGFR
BUN/Creatinine Ratio: 11 — ABNORMAL LOW (ref 12–28)
BUN: 8 mg/dL (ref 8–27)
CO2: 24 mmol/L (ref 20–29)
CREATININE: 0.76 mg/dL (ref 0.57–1.00)
Calcium: 10.4 mg/dL — ABNORMAL HIGH (ref 8.7–10.3)
Chloride: 106 mmol/L (ref 96–106)
GFR calc Af Amer: 94 mL/min/{1.73_m2} (ref >59)
GFR calc non Af Amer: 81 mL/min/{1.73_m2} (ref >59)
Glucose: 88 mg/dL (ref 65–99)
POTASSIUM: 4.1 mmol/L (ref 3.5–5.2)
SODIUM: 143 mmol/L (ref 134–144)

## 2018-05-20 LAB — URIC ACID: Uric Acid: 4.4 mg/dL (ref 2.5–7.1)

## 2018-05-20 LAB — VITAMIN D 25 HYDROXY (VIT D DEFICIENCY, FRACTURES): Vit D, 25-Hydroxy: 52.2 ng/mL (ref 30.0–100.0)

## 2018-05-21 ENCOUNTER — Other Ambulatory Visit: Payer: Self-pay

## 2018-05-21 ENCOUNTER — Other Ambulatory Visit: Payer: Self-pay | Admitting: *Deleted

## 2018-05-21 LAB — URINE CULTURE

## 2018-05-21 MED ORDER — ICOSAPENT ETHYL 1 G PO CAPS: 2.0000 | ORAL_CAPSULE | Freq: Two times a day (BID) | ORAL | 3 refills | Status: DC

## 2018-05-21 MED ORDER — ICOSAPENT ETHYL 1 G PO CAPS: 2.0000 g | ORAL_CAPSULE | Freq: Two times a day (BID) | ORAL | 5 refills | Status: DC

## 2018-07-08 ENCOUNTER — Encounter (INDEPENDENT_AMBULATORY_CARE_PROVIDER_SITE_OTHER): Payer: Self-pay | Admitting: Orthopaedic Surgery

## 2018-07-08 ENCOUNTER — Ambulatory Visit (INDEPENDENT_AMBULATORY_CARE_PROVIDER_SITE_OTHER): Payer: Medicare Other | Admitting: Orthopaedic Surgery

## 2018-07-08 VITALS — BP 151/71 | HR 64 | Ht 68.0 in | Wt 192.0 lb

## 2018-07-08 DIAGNOSIS — M654 Radial styloid tenosynovitis [de Quervain]: Secondary | ICD-10-CM | POA: Diagnosis not present

## 2018-07-08 DIAGNOSIS — M19032 Primary osteoarthritis, left wrist: Secondary | ICD-10-CM

## 2018-07-08 MED ORDER — BUPIVACAINE HCL 0.5 % IJ SOLN
0.5000 mL | INTRAMUSCULAR | Status: AC | PRN
  Administered 2018-07-08: .5 mL

## 2018-07-08 MED ORDER — METHYLPREDNISOLONE ACETATE 40 MG/ML IJ SUSP
20.0000 mg | INTRAMUSCULAR | Status: AC | PRN
  Administered 2018-07-08: 20 mg

## 2018-07-08 MED ORDER — LIDOCAINE HCL (PF) 1 % IJ SOLN
0.5000 mL | INTRAMUSCULAR | Status: AC | PRN
  Administered 2018-07-08: .5 mL

## 2018-07-08 NOTE — Progress Notes (Signed)
Office Visit Note   Patient: Shelly Bennett           Date of Birth: Sep 22, 1951           MRN: [id number] Visit Date: 07/08/2018              Requested by: Chipper Herb, MD [address] [place], [place] [address] PCP: Chipper Herb, MD   Assessment & Plan: Visit Diagnoses:  1. De Quervain's disease (radial styloid tenosynovitis)   2. Primary osteoarthritis of left wrist     Plan:  #1: Corticosteroid injection to the first dorsal compartment left wrist #2: Continue thumb spica splint for comfort #3: If symptoms are not improved possible MRI scan would be indicated for evaluation of the bone cyst as well as evaluation of the first dorsal compartment  Follow-Up Instructions: Return if symptoms worsen or fail to improve.   Orders:  Orders Placed This Encounter  Procedures  . Hand/UE Inj   No orders of the defined types were placed in this encounter.     Procedures: Hand/UE Inj: L extensor compartment 1 for de Quervain's tenosynovitis on 07/08/2018 10:22 AM Indications: pain and tendon swelling Details: 27 G needle, radial approach Medications: 0.5 mL lidocaine (PF) 1 %; 0.5 mL bupivacaine 0.5 %; 20 mg methylPREDNISolone acetate 40 MG/ML Procedure, treatment alternatives, risks and benefits explained, specific risks discussed. Consent was given by the patient. Immediately prior to procedure a time out was called to verify the correct patient, procedure, equipment, support staff and site/side marked as required. Patient was prepped and draped in the usual sterile fashion.       Clinical Data: No additional findings.   Subjective: Chief Complaint  Patient presents with  . Follow-up    L WRIST/FOREARM TO ELBOW WITH FEVER, SWELLING AND PAIN TRIED WRIST SPLINT     HPI  Shelly Bennett is a very pleasant 67 year old white female who presents today with left wrist pain and swelling mainly along the radial aspect of the wrist.  She did have an x-ray performed on May 19, 2018 which was just consistent with prior ulnar styloid evulsion with nonunion.  No acute fracture or dislocation is noted.  No gross soft tissue abnormality is seen.  She has been using a wrist splint that of a thumb spica splint which has given her some relief.  She has noted some nodularity also over the radial aspect of the wrist.  She does have pain with motion of the wrist.  The pain is moderately severe.  She denies any numbness at this time.  Denies any skin changes.  Review of Systems  Constitutional: Positive for fever. Negative for fatigue.  HENT: Negative for ear pain.   Eyes: Negative for pain.  Respiratory: Negative for cough and shortness of breath.   Cardiovascular: Negative for leg swelling.  Gastrointestinal: Negative for constipation and diarrhea.  Genitourinary: Negative for difficulty urinating.  Musculoskeletal: Negative for back pain and neck pain.  Skin: Negative for rash.  Allergic/Immunologic: Negative for food allergies.  Neurological: Positive for weakness. Negative for numbness.  Hematological: Does not bruise/bleed easily.  Psychiatric/Behavioral: Positive for sleep disturbance.     Objective: Vital Signs: BP (!) 151/71 (BP Location: Right Arm, Patient Position: Sitting, Cuff Size: Normal)   Pulse 64   Ht 5\' 8"  (1.727 m)   Wt 192 lb (87.1 kg)   BMI 29.19 kg/m   Physical Exam  Constitutional: She is oriented to person, place, and time. She  appears well-developed and well-nourished.  HENT:  Mouth/Throat: Oropharynx is clear and moist.  Eyes: Pupils are equal, round, and reactive to light. EOM are normal.  Pulmonary/Chest: Effort normal.  Neurological: She is alert and oriented to person, place, and time.  Skin: Skin is warm and dry.  Psychiatric: She has a normal mood and affect. Her behavior is normal.    Ortho Exam  Exam today reveals tenderness to palpation over the left wrist mainly along the radial styloid.  She does have a positive  Finkelstein's test.  Tenderness over the first dorsal compartment.  She does have some nodularity noted distal to the radial styloid.  Does have some swelling of the wrist itself.  No warmth or erythema at this time.  Dorsiflexion and palmar flexion to only about 45 degrees.  She does hurt with radial deviation.  She is neurovascular intact distally.   Specialty Comments:  No specialty comments available.  Imaging: Recent x-ray was read as no acute abnormality noted.  Changes consistent with prior ulnar styloid evulsion with nonunion.  No acute fracture or dislocation is noted.  No gross soft tissue abnormality is seen.  However with further evaluation it was noted to have no changes to the [facility] joint.  There was cystic changes in the inferior pole of the scaphoid.  Also multiple cysts throughout the scaphoid.  Ulnar styloid also has some bony cyst.   PMFS History: Patient Active Problem List   Diagnosis Date Noted  . Left lumbar radiculopathy 01/22/2018  . Mild cognitive impairment 10/22/2017  . Anxiety 10/22/2017  . Otitis of left ear 02/01/2017  . Aortic atherosclerosis ([place]) 12/04/2016  . Allergic drug reaction 06/25/2015  . Dehydration with hyponatremia 06/25/2015  . HCAP (healthcare-associated pneumonia) 06/25/2015  . Vitamin D deficiency 08/19/2014  . Family history of colon cancer-father at 78+ grandparent w/ rectal cancer 01/14/2014  . Osteopenia 08/12/2013  . CAD (coronary artery disease), native coronary artery 03/22/2013  . Aortic valve stenosis, mild 03/22/2013  . Hyperlipemia 03/11/2013  . Hypertension 03/11/2013  . Allergic rhinitis 03/11/2013  . Left shoulder pain 03/11/2013  . IBS (irritable bowel syndrome) - with chronic recurrent abdominal pain 02/01/2012  . Personal history of colonic polyps - adenoma 02/01/2012  . Coccydynia 02/01/2012   Past Medical History:  Diagnosis Date  . Adenomatous colon polyp   . Allergy   . Anxiety   . Arthritis   . CAD  (coronary artery disease)    Dr. Burt Knack follows   . Chronic headaches   . Cystocele   . Diverticulosis   . External hemorrhoids   . HLD (hyperlipidemia)   . HTN (hypertension)   . IBS (irritable bowel syndrome)   . Memory loss   . Obesity   . Pneumonia   . Skin cancer    squamous cell - face    Family History  Problem Relation Age of Onset  . Prostate cancer Father   . Colon cancer Father 56  . Kidney disease Father   . Heart disease Father   . Alzheimer's disease Father   . Hyperlipidemia Mother   . Hypertension Mother   . Kidney disease Mother   . Osteoporosis Mother   . Heart murmur Mother   . Dementia Mother   . Hyperlipidemia Sister   . Hypertension Sister   . Heart disease Brother 66       not dx questionable   . Heart disease Maternal Grandfather     Past Surgical History:  Procedure  Laterality Date  . CARPAL TUNNEL RELEASE    . COLONOSCOPY  12/15/2008   diverticulosis, external hemorrhoids  . KNEE SURGERY  05/22/2015   right  . LAPAROSCOPY    . SKIN SURGERY     squamous cell - right face    Social History   Occupational History  . Occupation: Pharmacist, hospital retired  Tobacco Use  . Smoking status: Never Smoker  . Smokeless tobacco: Never Used  Substance and Sexual Activity  . Alcohol use: No  . Drug use: No  . Sexual activity: Not Currently    Birth control/protection: None

## 2018-07-22 ENCOUNTER — Ambulatory Visit: Payer: Medicare Other | Admitting: Neurology

## 2018-07-22 ENCOUNTER — Encounter: Payer: Self-pay | Admitting: Neurology

## 2018-07-22 VITALS — BP 147/84 | HR 57 | Ht 68.0 in | Wt 197.5 lb

## 2018-07-22 DIAGNOSIS — R413 Other amnesia: Secondary | ICD-10-CM | POA: Insufficient documentation

## 2018-07-22 DIAGNOSIS — M5440 Lumbago with sciatica, unspecified side: Secondary | ICD-10-CM | POA: Insufficient documentation

## 2018-07-22 DIAGNOSIS — M545 Low back pain, unspecified: Secondary | ICD-10-CM | POA: Insufficient documentation

## 2018-07-22 NOTE — Progress Notes (Signed)
PATIENT: Shelly Bennett DOB: 1951/10/12  Chief Complaint  Patient presents with  . Back Pain    States her back and leg pain greatly improved with PT.  She has continued her home exercises.  She still has some discomfort with prolonged sitting but she makes sure to stand and stretch periodically which helps.     HISTORICAL  Shelly Bennett is a 67 years old female, seen in refer by her primary care doctor Redge Gainer for evaluation of anxiety, memory loss, initial evaluation was on November 6th 2018.   I have reviewed and summarized the referring note, she has history of hypertension, coronary artery disease, hyperlipidemia, is a retired Automotive engineer. She drove herself to clinic today.  She reported stress, she is the main caretaker of her mother that is 39 years old, since 2017, she was noted to repeat herself, forget peoples name, displaced pains,  Since 2018, she also suffered depression anxiety, because of the strained relationship, she is taking BuSpar, and Paxil since August 2018 which seems to help her some,  She has mild gait abnormality due to previous left knee injury.   Laboratory evaluations, B12 474, normal liver functional tests, vitamin D level 57, lipid profile LDL 82, total cholesterol 169,  UPDATE Feb 6th 2019: She continue has mild memory loss, Mini-Mental Status Examination 28/30, MRI of the brain showed moderate generalized atrophy, ventriculomegaly mild supratentorium small vessel disease  She also complains of chronic left low back pain radiating pain to left hip, I have personally reviewed MRI lumbar in 2016, multilevel degenerative changes, most severe at L3-4, with moderate spinal stenosis, bilateral lateral recess stenosis.  UPDATE August 6th 2019: She still has mild memory loss, misplace things, her mother passed away on 06-08-2018, her left leg pain, low back pain has much improved after physical therapy   REVIEW OF  SYSTEMS: Full 14 system review of systems performed and notable only for as above  ALLERGIES: Allergies  Allergen Reactions  . Codeine     ? Reaction ER visit.   . Mold Extract [Trichophyton]     Migraine  . Penicillins Other (See Comments)    "drew my legs up as child" Pt has tolerated cephalexin in the past.  . Ace Inhibitors Cough  . Angiotensin Receptor Blockers Cough  . Levaquin [Levofloxacin] Rash    Per notes from outpatient provider  . Vytorin [Ezetimibe-Simvastatin] Other (See Comments)    cramping  . Zetia [Ezetimibe] Other (See Comments)    cramping    HOME MEDICATIONS: Current Outpatient Medications  Medication Sig Dispense Refill  . albuterol (PROVENTIL HFA;VENTOLIN HFA) 108 (90 Base) MCG/ACT inhaler Inhale 2 puffs into the lungs every 6 (six) hours as needed for wheezing or shortness of breath. 1 Inhaler 11  . amLODipine (NORVASC) 10 MG tablet Take 0.5 tablets (5 mg total) by mouth daily. 45 tablet 3  . aspirin 81 MG tablet Take 81 mg by mouth daily.     . busPIRone (BUSPAR) 15 MG tablet Take 1 tablet (15 mg total) by mouth 2 (two) times daily as needed. 60 tablet 1  . calcium carbonate 200 MG capsule Take 250 mg by mouth 2 (two) times daily.     . Cholecalciferol (VITAMIN D-3) 5000 UNITS TABS Take 1 capsule by mouth as directed. Take one capsule by mouth daily Mon thru Friday    . fexofenadine (ALLEGRA) 180 MG tablet Take 180 mg by mouth daily.    Marland Kitchen  fluticasone (FLONASE) 50 MCG/ACT nasal spray Place 2 sprays into both nostrils daily. 16 g 3  . fluticasone furoate-vilanterol (BREO ELLIPTA) 100-25 MCG/INH AEPB Inhale 1 puff into the lungs daily as needed (wheezing).    . hydrochlorothiazide (HYDRODIURIL) 25 MG tablet Take 0.5 tablets (12.5 mg total) by mouth daily. 45 tablet 3  . HYDROcodone-acetaminophen (NORCO/VICODIN) 5-325 MG per tablet Take 0.5 tablets by mouth at bedtime as needed for moderate pain.     Vanessa Kick Ethyl (VASCEPA) 1 g CAPS Take 2 capsules (2 g  total) by mouth 2 (two) times daily. 120 capsule 5  . Icosapent Ethyl (VASCEPA) 1 g CAPS Take 2 capsules (2 g total) by mouth 2 (two) times daily. 120 capsule 3  . omega-3 acid ethyl esters (LOVAZA) 1 g capsule TAKE (1) CAPSULE FOUR TIMES DAILY. 360 capsule 1  . PARoxetine (PAXIL) 20 MG tablet Take 20 mg by mouth at bedtime.     . rosuvastatin (CRESTOR) 40 MG tablet Take 1 tablet (40 mg total) by mouth daily. 90 tablet 3   No current facility-administered medications for this visit.     PAST MEDICAL HISTORY: Past Medical History:  Diagnosis Date  . Adenomatous colon polyp   . Allergy   . Anxiety   . Arthritis   . CAD (coronary artery disease)    Dr. Burt Knack follows   . Chronic headaches   . Cystocele   . Diverticulosis   . External hemorrhoids   . HLD (hyperlipidemia)   . HTN (hypertension)   . IBS (irritable bowel syndrome)   . Memory loss   . Obesity   . Pneumonia   . Skin cancer    squamous cell - face    PAST SURGICAL HISTORY: Past Surgical History:  Procedure Laterality Date  . CARPAL TUNNEL RELEASE    . COLONOSCOPY  12/15/2008   diverticulosis, external hemorrhoids  . KNEE SURGERY  05/22/2015   right  . LAPAROSCOPY    . SKIN SURGERY     squamous cell - right face     FAMILY HISTORY: Family History  Problem Relation Age of Onset  . Prostate cancer Father   . Colon cancer Father 48  . Kidney disease Father   . Heart disease Father   . Alzheimer's disease Father   . Hyperlipidemia Mother   . Hypertension Mother   . Kidney disease Mother   . Osteoporosis Mother   . Heart murmur Mother   . Dementia Mother   . Hyperlipidemia Sister   . Hypertension Sister   . Heart disease Brother 5       not dx questionable   . Heart disease Maternal Grandfather     SOCIAL HISTORY:  Social History   Socioeconomic History  . Marital status: Married    Spouse name: [name]  . Number of children: 4  . Years of education: 16  . Highest education level: Bachelor's  degree (e.g., BA, AB, BS)  Occupational History  . 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 Financial resource strain: Not hard at all  . Food insecurity:    Worry: Never true    Inability: Never true  . Transportation needs:    Medical: No    Non-medical: No  Tobacco Use  . Smoking status: Never Smoker  . Smokeless tobacco: Never Used  Substance and Sexual Activity  . Alcohol use: No  . Drug use: No  . Sexual activity: Not Currently    Birth control/protection: None  Lifestyle  .  Physical activity:    Days per week: 0 days    Minutes per session: Not on file  . Stress: Only a little  Relationships  . Social connections:    Talks on phone: More than three times a week    Gets together: More than three times a week    Attends religious service: More than 4 times per year    Active member of club or organization: Yes    Attends meetings of clubs or organizations: More than 4 times per year    Relationship status: Married  . Intimate partner violence:    Fear of current or ex partner: No    Emotionally abused: No    Physically abused: No    Forced sexual activity: No  Other Topics Concern  . Not on file  Social History Narrative   Lives at home with her husband.   Right-handed.   One cup coffee each morning and one soda each day.     PHYSICAL EXAM   Vitals:   07/22/18 1040  BP: (!) 147/84  Pulse: (!) 57  Weight: 197 lb 8 oz (89.6 kg)  Height: 5\' 8"  (1.727 m)    Not recorded      Body mass index is 30.03 kg/m.  PHYSICAL EXAMNIATION:  Gen: NAD, conversant, well nourised, obese, well groomed                     Cardiovascular: Regular rate rhythm, no peripheral edema, warm, nontender. Eyes: Conjunctivae clear without exudates or hemorrhage Neck: Supple, no carotid bruits. Pulmonary: Clear to auscultation bilaterally   NEUROLOGICAL EXAM:  MENTAL STATUS: MMSE - Mini Mental State Exam 01/30/2018 01/22/2018 10/22/2017  Orientation to time 5 5 5     Orientation to Place 5 5 5   Registration 3 3 3   Attention/ Calculation 5 3 5   Recall 3 3 2   Language- name 2 objects 2 2 2   Language- repeat 1 1 1   Language- follow 3 step command 3 3 3   Language- read & follow direction 1 1 1   Write a sentence 1 1 1   Copy design 1 1 1   Total score 30 28 29   animal naming 12   CRANIAL NERVES: CN II: Visual fields are full to confrontation. Fundoscopic exam is normal with sharp discs and no vascular changes. Pupils are round equal and briskly reactive to light. CN III, IV, VI: extraocular movement are normal. No ptosis. CN V: Facial sensation is intact to pinprick in all 3 divisions bilaterally. Corneal responses are intact.  CN VII: Face is symmetric with normal eye closure and smile. CN VIII: Hearing is normal to rubbing fingers CN IX, X: Palate elevates symmetrically. Phonation is normal. CN XI: Head turning and shoulder shrug are intact CN XII: Tongue is midline with normal movements and no atrophy.  MOTOR: There is no pronator drift of out-stretched arms. Muscle bulk and tone are normal. Muscle strength is normal.  REFLEXES: Reflexes are 2+ and symmetric at the biceps, triceps, knees, and ankles. Plantar responses are flexor.  SENSORY: Intact to light touch, pinprick, positional sensation and vibratory sensation are intact in fingers and toes.  COORDINATION: Rapid alternating movements and fine finger movements are intact. There is no dysmetria on finger-to-nose and heel-knee-shin.    GAIT/STANCE: Need to push up to get up from seated position, mildly antalgic, cautious.   DIAGNOSTIC DATA (LABS, IMAGING, TESTING) - I reviewed patient records, labs, notes, testing and imaging myself where available.  ASSESSMENT AND PLAN  Shelly Bennett is a 67 y.o. female    Mild cognitive Impairment:  MMSE 28 /30.  MRI brain in November 2018 showed generalized atrophy, ventriculomegaly, mild supratentorium small vessel  disease..  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 Ph.D.  [facility] [address], [place], [place] [address] Ph: [phone] Fax: [phone]  CC: Referring Provider

## 2018-09-19 ENCOUNTER — Other Ambulatory Visit: Payer: Self-pay | Admitting: Family Medicine

## 2018-09-22 NOTE — Telephone Encounter (Signed)
OV 09/24/18 

## 2018-09-24 ENCOUNTER — Ambulatory Visit: Payer: Medicare Other | Admitting: Family Medicine

## 2018-09-24 ENCOUNTER — Encounter: Payer: Self-pay | Admitting: Family Medicine

## 2018-09-24 VITALS — BP 134/70 | HR 57 | Temp 97.9°F | Ht 68.0 in | Wt 202.0 lb

## 2018-09-24 DIAGNOSIS — I2510 Atherosclerotic heart disease of native coronary artery without angina pectoris: Secondary | ICD-10-CM

## 2018-09-24 DIAGNOSIS — Z800 Family history of malignant neoplasm of digestive organs: Secondary | ICD-10-CM

## 2018-09-24 DIAGNOSIS — E7800 Pure hypercholesterolemia, unspecified: Secondary | ICD-10-CM

## 2018-09-24 DIAGNOSIS — I700 Atherosclerosis of aorta: Secondary | ICD-10-CM

## 2018-09-24 DIAGNOSIS — F339 Major depressive disorder, recurrent, unspecified: Secondary | ICD-10-CM

## 2018-09-24 DIAGNOSIS — I10 Essential (primary) hypertension: Secondary | ICD-10-CM | POA: Diagnosis not present

## 2018-09-24 DIAGNOSIS — E559 Vitamin D deficiency, unspecified: Secondary | ICD-10-CM | POA: Diagnosis not present

## 2018-09-24 DIAGNOSIS — Z23 Encounter for immunization: Secondary | ICD-10-CM

## 2018-09-24 DIAGNOSIS — K219 Gastro-esophageal reflux disease without esophagitis: Secondary | ICD-10-CM

## 2018-09-24 DIAGNOSIS — R5383 Other fatigue: Secondary | ICD-10-CM

## 2018-09-24 MED ORDER — ICOSAPENT ETHYL 1 G PO CAPS: 2.0000 | ORAL_CAPSULE | Freq: Two times a day (BID) | ORAL | 1 refills | Status: DC

## 2018-09-24 MED ORDER — AMLODIPINE BESYLATE 10 MG PO TABS: ORAL_TABLET | ORAL | 3 refills | Status: DC

## 2018-09-24 MED ORDER — OMEGA-3-ACID ETHYL ESTERS 1 G PO CAPS: ORAL_CAPSULE | ORAL | 3 refills | Status: DC

## 2018-09-24 MED ORDER — PAROXETINE HCL 20 MG PO TABS: 30.0000 mg | ORAL_TABLET | Freq: Every day | ORAL | 2 refills | Status: DC

## 2018-09-24 NOTE — Progress Notes (Signed)
Subjective:    Patient ID: Shelly Bennett, female    DOB: 04-Mar-1951, 68 y.o.   MRN: [id number]  HPI Pt here for follow up and management of chronic medical problems which includes hypertension and hyperlipidemia. She is taking medication regularly.  The patient today is complaining of increased somnolence and no energy.  She says she is going to snack and eat more than she should.  She has had several losses in her family including her mother and her niece that were especially unexpected with the niece.  She continues to have left hip pain.  She is requesting refills on Lovaza Paxil and amlodipine.  She will get her flu shot today and will be given an FOBT to return along with getting lab work.  The patient does mention that she just has no energy has not been very active since all of these deaths in her family.  She was encouraged to try to get out and get more exercise walking and continue to drink plenty of water.  She has to do better herself with getting herself motivated.  We also discussed increasing the Paxil to 30 mg daily and making sure that metabolically thyroid D53 levels and electrolytes are all good for her.  Today she denies any chest pain or shortness of breath anymore than usual.  She has no trouble with her stomach including nausea vomiting diarrhea blood in the stool or black tarry bowel movements.  She is passing her water without problems.     Patient Active Problem List   Diagnosis Date Noted  . Memory loss 07/22/2018  . Low back pain 07/22/2018  . Left lumbar radiculopathy 01/22/2018  . Mild cognitive impairment 10/22/2017  . Anxiety 10/22/2017  . Otitis of left ear 02/01/2017  . Aortic atherosclerosis ([place]) 12/04/2016  . Allergic drug reaction 06/25/2015  . Dehydration with hyponatremia 06/25/2015  . HCAP (healthcare-associated pneumonia) 06/25/2015  . Vitamin D deficiency 08/19/2014  . Family history of colon cancer-father at 42+ grandparent w/ rectal cancer  01/14/2014  . Osteopenia 08/12/2013  . CAD (coronary artery disease), native coronary artery 03/22/2013  . Aortic valve stenosis, mild 03/22/2013  . Hyperlipemia 03/11/2013  . Hypertension 03/11/2013  . Allergic rhinitis 03/11/2013  . Left shoulder pain 03/11/2013  . IBS (irritable bowel syndrome) - with chronic recurrent abdominal pain 02/01/2012  . Personal history of colonic polyps - adenoma 02/01/2012  . Coccydynia 02/01/2012   Outpatient Encounter Medications as of 09/24/2018  Medication Sig  . albuterol (PROVENTIL HFA;VENTOLIN HFA) 108 (90 Base) MCG/ACT inhaler Inhale 2 puffs into the lungs every 6 (six) hours as needed for wheezing or shortness of breath.  Marland Kitchen amLODipine (NORVASC) 10 MG tablet TAKE (1/2) TABLET DAILY.  Marland Kitchen aspirin 81 MG tablet Take 81 mg by mouth daily.   . busPIRone (BUSPAR) 15 MG tablet Take 1 tablet (15 mg total) by mouth 2 (two) times daily as needed.  . calcium carbonate 200 MG capsule Take 250 mg by mouth 2 (two) times daily.   . Cholecalciferol (VITAMIN D-3) 5000 UNITS TABS Take 1 capsule by mouth as directed. Take one capsule by mouth daily Mon thru Friday  . fexofenadine (ALLEGRA) 180 MG tablet Take 180 mg by mouth daily.  . fluticasone (FLONASE) 50 MCG/ACT nasal spray Place 2 sprays into both nostrils daily.  . fluticasone furoate-vilanterol (BREO ELLIPTA) 100-25 MCG/INH AEPB Inhale 1 puff into the lungs daily as needed (wheezing).  . hydrochlorothiazide (HYDRODIURIL) 25 MG tablet Take 0.5 tablets (12.5  mg total) by mouth daily.  Marland Kitchen omega-3 acid ethyl esters (LOVAZA) 1 g capsule TAKE (1) CAPSULE FOUR TIMES DAILY.  Marland Kitchen PARoxetine (PAXIL) 20 MG tablet TAKE ONE TABLET AT BEDTIME  . rosuvastatin (CRESTOR) 40 MG tablet Take 1 tablet (40 mg total) by mouth daily.  Marland Kitchen HYDROcodone-acetaminophen (NORCO/VICODIN) 5-325 MG per tablet Take 0.5 tablets by mouth at bedtime as needed for moderate pain.   . [DISCONTINUED] Icosapent Ethyl (VASCEPA) 1 g CAPS Take 2 capsules (2 g  total) by mouth 2 (two) times daily.  . [DISCONTINUED] Icosapent Ethyl (VASCEPA) 1 g CAPS Take 2 capsules (2 g total) by mouth 2 (two) times daily.   No facility-administered encounter medications on file as of 09/24/2018.      Review of Systems  Constitutional: Positive for fatigue (wanting to sleep and snack - no energy ).  HENT: Negative.   Eyes: Negative.   Respiratory: Negative.   Cardiovascular: Negative.   Gastrointestinal: Negative.   Endocrine: Negative.   Genitourinary: Negative.   Musculoskeletal: Positive for arthralgias (left hip - continued pain ).  Skin: Negative.   Allergic/Immunologic: Negative.   Neurological: Negative.   Hematological: Negative.   Psychiatric/Behavioral: Negative.        Objective:   Physical Exam  Constitutional: She is oriented to person, place, and time. She appears well-developed and well-nourished. She appears distressed.  The patient looks stressed and fatigued.  HENT:  Head: Normocephalic and atraumatic.  Right Ear: External ear normal.  Left Ear: External ear normal.  Mouth/Throat: Oropharynx is clear and moist. No oropharyngeal exudate.  Nasal turbinate congestion  Eyes: Pupils are equal, round, and reactive to light. Conjunctivae and EOM are normal. Right eye exhibits no discharge. Left eye exhibits no discharge. No scleral icterus.  Needs eye exam  Neck: Normal range of motion. Neck supple. No thyromegaly present.  No bruits thyromegaly or anterior cervical adenopathy  Cardiovascular: Normal rate, regular rhythm, normal heart sounds and intact distal pulses.  No murmur heard. The heart has a regular rate and rhythm at 72/min with good pedal pulses and no edema  Pulmonary/Chest: Effort normal and breath sounds normal. She has no wheezes. She has no rales.  Clear anteriorly and posteriorly  Abdominal: Soft. Bowel sounds are normal. She exhibits no mass. There is no tenderness.  No liver or spleen enlargement no epigastric  tenderness masses bruits or suprapubic tenderness.  Musculoskeletal: Normal range of motion. She exhibits tenderness. She exhibits no edema.  No edema, patient continues to have problems with left hip pain.  I encouraged walking.  Lymphadenopathy:    She has no cervical adenopathy.  Neurological: She is alert and oriented to person, place, and time. She has normal reflexes. No cranial nerve deficit.  Reflexes are 1+ and equal bilaterally  Skin: Skin is warm and dry. No rash noted.  Psychiatric: She has a normal mood and affect. Her behavior is normal. Judgment and thought content normal.  Mood affect and behavior are all normal for this patient other than her being more down and out with depression secondary to the recent deaths in her family and her ongoing inactivity.  Nursing note and vitals reviewed.  BP 134/70 (BP Location: Left Arm)   Pulse (!) 57   Temp 97.9 F (36.6 C) (Oral)   Ht 5' 8"  (1.727 m)   Wt 202 lb (91.6 kg)   BMI 30.71 kg/m         Assessment & Plan:  1. Hypertension -The blood pressure is  good today and she will continue with current treatment - BMP8+EGFR - CBC with Differential/Platelet - Hepatic function panel  2. Pure hypercholesterolemia -Continue with current treatment pending results of lab work - CBC with Differential/Platelet - Lipid panel  3. Vitamin D deficiency -Continue with calcium and vitamin D replacement pending results of lab work - CBC with Differential/Platelet - VITAMIN D 25 Hydroxy (Vit-D Deficiency, Fractures)  4. Arteriosclerotic cardiovascular disease (ASCVD) -Continue to follow-up with Dr. Burt Knack her cardiologist as planned - CBC with Differential/Platelet  5. Gastroesophageal reflux disease, esophagitis presence not specified -Continue to follow healthy diet and avoid irritating foods that are fried greasy or any alcohol. - CBC with Differential/Platelet  6. Fatigue, unspecified type - Thyroid Panel With TSH - Vitamin  B12  7. Aortic atherosclerosis ([place]) -Continue aggressive therapeutic lifestyle changes and current statin treatment and omega-3 fatty acids.  8. Depression, recurrent ([place]) -Until lab work is back, go ahead and increase Paxil to 30 mg daily. -Start walking more -Reduce sugar and carbs  9.  Family history colon cancer -Patient due colonoscopy in January 2020   Meds ordered this encounter  Medications  . amLODipine (NORVASC) 10 MG tablet    Sig: TAKE (1/2) TABLET DAILY.    Dispense:  45 tablet    Refill:  3    $  . omega-3 acid ethyl esters (LOVAZA) 1 g capsule    Sig: TAKE (1) CAPSULE FOUR TIMES DAILY.    Dispense:  360 capsule    Refill:  3  . Icosapent Ethyl (VASCEPA) 1 g CAPS    Sig: Take 2 capsules (2 g total) by mouth 2 (two) times daily.    Dispense:  360 capsule    Refill:  1  . PARoxetine (PAXIL) 20 MG tablet    Sig: Take 1.5 tablets (30 mg total) by mouth at bedtime.    Dispense:  135 tablet    Refill:  2    $   Patient Instructions                       Medicare Annual Wellness Visit  [facility] and the medical providers at [facility] strive to bring you the best medical care.  In doing so we not only want to address your current medical conditions and concerns but also to detect new conditions early and prevent illness, disease and health-related problems.    Medicare offers a yearly Wellness Visit which allows our clinical staff to assess your need for preventative services including immunizations, lifestyle education, counseling to decrease risk of preventable diseases and screening for fall risk and other medical concerns.    This visit is provided free of charge (no copay) for all Medicare recipients. The clinical pharmacists at [facility] have begun to conduct these Wellness Visits which will also include a thorough review of all your medications.    As you primary medical provider recommend that you make an  appointment for your Annual Wellness Visit if you have not done so already this year.  You may set up this appointment before you leave today or you may call back ([phone]) and schedule an appointment.  Please make sure when you call that you mention that you are scheduling your Annual Wellness Visit with the clinical pharmacist so that the appointment may be made for the proper length of time.     Continue current medications. Continue good therapeutic lifestyle changes which include good diet  and exercise. Fall precautions discussed with patient. If an FOBT was given today- please return it to our front desk. If you are over 24 years old - you may need Prevnar 69 or the adult Pneumonia vaccine.  **Flu shots are available--- please call and schedule a FLU-CLINIC appointment**  After your visit with [place] today you will receive a survey in the mail or online from [employer] regarding your care with [place]. Please take a moment to fill this out. Your feedback is very important to [place] as you can help [place] better understand your patient needs as well as improve your experience and satisfaction. WE CARE ABOUT YOU!!!   Do not forget to get your eye exam Get out and start walking and exercising more this will make you feel better Increase the Paxil to 30 mg daily We will call with your lab work results as soon as these results become available Stay active stay busy and take better care of yourself!!! Also, remember not to use overhead fans this is dry your airways out and makes you more sensitive to the environment and to viruses and bugs that are going around Also, remember that your next colonoscopy will be due in January 2020 because of the family history of colon cancer  Arrie Senate MD

## 2018-09-24 NOTE — Patient Instructions (Addendum)
Medicare Annual Wellness Visit  [facility] and the medical providers at [facility] strive to bring you the best medical care.  In doing so we not only want to address your current medical conditions and concerns but also to detect new conditions early and prevent illness, disease and health-related problems.    Medicare offers a yearly Wellness Visit which allows our clinical staff to assess your need for preventative services including immunizations, lifestyle education, counseling to decrease risk of preventable diseases and screening for fall risk and other medical concerns.    This visit is provided free of charge (no copay) for all Medicare recipients. The clinical pharmacists at [facility] have begun to conduct these Wellness Visits which will also include a thorough review of all your medications.    As you primary medical provider recommend that you make an appointment for your Annual Wellness Visit if you have not done so already this year.  You may set up this appointment before you leave today or you may call back ([phone]) and schedule an appointment.  Please make sure when you call that you mention that you are scheduling your Annual Wellness Visit with the clinical pharmacist so that the appointment may be made for the proper length of time.     Continue current medications. Continue good therapeutic lifestyle changes which include good diet and exercise. Fall precautions discussed with patient. If an FOBT was given today- please return it to our front desk. If you are over 84 years old - you may need Prevnar 19 or the adult Pneumonia vaccine.  **Flu shots are available--- please call and schedule a FLU-CLINIC appointment**  After your visit with [place] today you will receive a survey in the mail or online from [employer] regarding your care with [place]. Please take a moment to fill this out. Your feedback is very  important to [place] as you can help [place] better understand your patient needs as well as improve your experience and satisfaction. WE CARE ABOUT YOU!!!   Do not forget to get your eye exam Get out and start walking and exercising more this will make you feel better Increase the Paxil to 30 mg daily We will call with your lab work results as soon as these results become available Stay active stay busy and take better care of yourself!!! Also, remember not to use overhead fans this is dry your airways out and makes you more sensitive to the environment and to viruses and bugs that are going around Also, remember that your next colonoscopy will be due in January 2020 because of the family history of colon cancer

## 2018-09-25 LAB — LIPID PANEL
CHOL/HDL RATIO: 2.8 ratio (ref 0.0–4.4)
Cholesterol, Total: 169 mg/dL (ref 100–199)
HDL: 60 mg/dL (ref >39)
LDL Calculated: 80 mg/dL (ref 0–99)
Triglycerides: 145 mg/dL (ref 0–149)
VLDL Cholesterol Cal: 29 mg/dL (ref 5–40)

## 2018-09-25 LAB — CBC WITH DIFFERENTIAL/PLATELET
BASOS: 1 %
Basophils Absolute: 0.1 10*3/uL (ref 0.0–0.2)
EOS (ABSOLUTE): 0.2 10*3/uL (ref 0.0–0.4)
Eos: 4 %
HEMATOCRIT: 40 % (ref 34.0–46.6)
Hemoglobin: 13.2 g/dL (ref 11.1–15.9)
Immature Grans (Abs): 0 10*3/uL (ref 0.0–0.1)
Immature Granulocytes: 1 %
Lymphocytes Absolute: 1.4 10*3/uL (ref 0.7–3.1)
Lymphs: 24 %
MCH: 30.1 pg (ref 26.6–33.0)
MCHC: 33 g/dL (ref 31.5–35.7)
MCV: 91 fL (ref 79–97)
MONOS ABS: 0.4 10*3/uL (ref 0.1–0.9)
Monocytes: 6 %
NEUTROS ABS: 3.8 10*3/uL (ref 1.4–7.0)
NEUTROS PCT: 64 %
PLATELETS: 280 10*3/uL (ref 150–450)
RBC: 4.39 x10E6/uL (ref 3.77–5.28)
RDW: 13 % (ref 12.3–15.4)
WBC: 5.8 10*3/uL (ref 3.4–10.8)

## 2018-09-25 LAB — BMP8+EGFR
BUN/Creatinine Ratio: 19 (ref 12–28)
BUN: 13 mg/dL (ref 8–27)
CO2: 22 mmol/L (ref 20–29)
Calcium: 10.1 mg/dL (ref 8.7–10.3)
Chloride: 102 mmol/L (ref 96–106)
Creatinine, Ser: 0.7 mg/dL (ref 0.57–1.00)
GFR, EST AFRICAN AMERICAN: 103 mL/min/{1.73_m2} (ref >59)
GFR, EST NON AFRICAN AMERICAN: 89 mL/min/{1.73_m2} (ref >59)
Glucose: 84 mg/dL (ref 65–99)
POTASSIUM: 4.1 mmol/L (ref 3.5–5.2)
Sodium: 141 mmol/L (ref 134–144)

## 2018-09-25 LAB — HEPATIC FUNCTION PANEL
ALBUMIN: 4.2 g/dL (ref 3.6–4.8)
ALT: 15 IU/L (ref 0–32)
AST: 16 IU/L (ref 0–40)
Alkaline Phosphatase: 73 IU/L (ref 39–117)
BILIRUBIN TOTAL: 0.5 mg/dL (ref 0.0–1.2)
Bilirubin, Direct: 0.14 mg/dL (ref 0.00–0.40)
Total Protein: 6.5 g/dL (ref 6.0–8.5)

## 2018-09-25 LAB — THYROID PANEL WITH TSH
FREE THYROXINE INDEX: 1.4 (ref 1.2–4.9)
T3 UPTAKE RATIO: 22 % — AB (ref 24–39)
T4 TOTAL: 6.5 ug/dL (ref 4.5–12.0)
TSH: 2.25 u[IU]/mL (ref 0.450–4.500)

## 2018-09-25 LAB — VITAMIN B12: Vitamin B-12: 444 pg/mL (ref 232–1245)

## 2018-09-25 LAB — VITAMIN D 25 HYDROXY (VIT D DEFICIENCY, FRACTURES): Vit D, 25-Hydroxy: 56.4 ng/mL (ref 30.0–100.0)

## 2018-09-29 ENCOUNTER — Telehealth: Payer: Self-pay | Admitting: Family Medicine

## 2018-09-29 NOTE — Telephone Encounter (Signed)
Patient aware of lab results.

## 2018-10-01 LAB — CK: CK TOTAL: 136 U/L (ref 24–173)

## 2018-10-01 LAB — SPECIMEN STATUS REPORT

## 2018-10-29 ENCOUNTER — Encounter (INDEPENDENT_AMBULATORY_CARE_PROVIDER_SITE_OTHER): Payer: Self-pay | Admitting: Orthopaedic Surgery

## 2018-10-29 ENCOUNTER — Ambulatory Visit (INDEPENDENT_AMBULATORY_CARE_PROVIDER_SITE_OTHER): Payer: Medicare Other | Admitting: Orthopaedic Surgery

## 2018-10-29 DIAGNOSIS — M65311 Trigger thumb, right thumb: Secondary | ICD-10-CM | POA: Insufficient documentation

## 2018-10-29 MED ORDER — LIDOCAINE HCL 1 % IJ SOLN
1.0000 mL | INTRAMUSCULAR | Status: AC | PRN
  Administered 2018-10-29: 1 mL

## 2018-10-29 MED ORDER — METHYLPREDNISOLONE ACETATE 40 MG/ML IJ SUSP
20.0000 mg | INTRAMUSCULAR | Status: AC | PRN
  Administered 2018-10-29: 20 mg

## 2018-10-29 NOTE — Progress Notes (Signed)
Office Visit Note   Patient: Shelly Bennett           Date of Birth: May 16, 1951           MRN: [id number] Visit Date: 10/29/2018              Requested by: Chipper Herb, MD [address] [place], [place] [address] PCP: Chipper Herb, MD   Assessment & Plan: Visit Diagnoses:  1. Trigger thumb, right thumb     Plan: We will inject with cortisone and monitor response.  Apply splint across the IP joint of thumb  Follow-Up Instructions: Return if symptoms worsen or fail to improve.   Orders:  No orders of the defined types were placed in this encounter.  No orders of the defined types were placed in this encounter.     Procedures: Hand/UE Inj: R thumb A1 for trigger finger on 10/29/2018 2:35 PM Details: 27 G needle, volar approach Medications: 1 mL lidocaine 1 %; 20 mg methylPREDNISolone acetate 40 MG/ML      Clinical Data: No additional findings.   Subjective: No chief complaint on file. Developed early triggering of the right thumb over the past 6 months with increased frequency over the last month or so.  No injury.  No thumb swelling.  Neurologically intact.  Had similar problem 6 months or so ago left thumb with good response to injection  HPI  Review of Systems   Objective: Vital Signs: There were no vitals taken for this visit.  Physical Exam  Constitutional: She is oriented to person, place, and time. She appears well-developed and well-nourished.  HENT:  Mouth/Throat: Oropharynx is clear and moist.  Eyes: Pupils are equal, round, and reactive to light. EOM are normal.  Pulmonary/Chest: Effort normal.  Neurological: She is alert and oriented to person, place, and time.  Skin: Skin is warm and dry.  Psychiatric: She has a normal mood and affect. Her behavior is normal.    Ortho Exam awake alert and oriented x3 comfortable sitting.  Painful area on the volar aspect left thumb at the metacarpal phalangeal joint associated with a small  nodule.  This is consistent with early triggering.  Fingers not swollen.  Neurologically intact.  No pain at the base of the thumb.  Specialty Comments:  No specialty comments available.  Imaging: No results found.   PMFS History: Patient Active Problem List   Diagnosis Date Noted  . Trigger thumb, right thumb 10/29/2018  . Memory loss 07/22/2018  . Low back pain 07/22/2018  . Left lumbar radiculopathy 01/22/2018  . Mild cognitive impairment 10/22/2017  . Anxiety 10/22/2017  . Otitis of left ear 02/01/2017  . Aortic atherosclerosis ([place]) 12/04/2016  . Allergic drug reaction 06/25/2015  . Dehydration with hyponatremia 06/25/2015  . HCAP (healthcare-associated pneumonia) 06/25/2015  . Vitamin D deficiency 08/19/2014  . Family history of colon cancer-father at 49+ grandparent w/ rectal cancer 01/14/2014  . Osteopenia 08/12/2013  . CAD (coronary artery disease), native coronary artery 03/22/2013  . Aortic valve stenosis, mild 03/22/2013  . Hyperlipemia 03/11/2013  . Hypertension 03/11/2013  . Allergic rhinitis 03/11/2013  . Left shoulder pain 03/11/2013  . IBS (irritable bowel syndrome) - with chronic recurrent abdominal pain 02/01/2012  . Personal history of colonic polyps - adenoma 02/01/2012  . Coccydynia 02/01/2012   Past Medical History:  Diagnosis Date  . Adenomatous colon polyp   . Allergy   . Anxiety   . Arthritis   . CAD (  coronary artery disease)    Dr. Burt Knack follows   . Chronic headaches   . Cystocele   . Diverticulosis   . External hemorrhoids   . HLD (hyperlipidemia)   . HTN (hypertension)   . IBS (irritable bowel syndrome)   . Memory loss   . Obesity   . Pneumonia   . Skin cancer    squamous cell - face    Family History  Problem Relation Age of Onset  . Prostate cancer Father   . Colon cancer Father 74  . Kidney disease Father   . Heart disease Father   . Alzheimer's disease Father   . Hyperlipidemia Mother   . Hypertension Mother   .  Kidney disease Mother   . Osteoporosis Mother   . Heart murmur Mother   . Dementia Mother   . Hyperlipidemia Sister   . Hypertension Sister   . Heart disease Brother 75       not dx questionable   . Heart disease Maternal Grandfather     Past Surgical History:  Procedure Laterality Date  . CARPAL TUNNEL RELEASE    . COLONOSCOPY  12/15/2008   diverticulosis, external hemorrhoids  . KNEE SURGERY  05/22/2015   right  . LAPAROSCOPY    . SKIN SURGERY     squamous cell - right face    Social History   Occupational History  . Occupation: Pharmacist, hospital retired  Tobacco Use  . Smoking status: Never Smoker  . Smokeless tobacco: Never Used  Substance and Sexual Activity  . Alcohol use: No  . Drug use: No  . Sexual activity: Not Currently    Birth control/protection: None     Garald Balding, MD   Note - This record has been created using [employer].  Chart creation errors have been sought, but may not always  have been located. Such creation errors do not reflect on  the standard of medical care.

## 2018-11-08 ENCOUNTER — Other Ambulatory Visit: Payer: Self-pay | Admitting: Family Medicine

## 2018-11-10 ENCOUNTER — Other Ambulatory Visit: Payer: Medicare Other

## 2018-11-10 DIAGNOSIS — Z1211 Encounter for screening for malignant neoplasm of colon: Secondary | ICD-10-CM

## 2018-11-11 LAB — FECAL OCCULT BLOOD, IMMUNOCHEMICAL: Fecal Occult Bld: NEGATIVE

## 2018-11-21 ENCOUNTER — Encounter: Payer: Self-pay | Admitting: Family Medicine

## 2018-11-21 ENCOUNTER — Ambulatory Visit (INDEPENDENT_AMBULATORY_CARE_PROVIDER_SITE_OTHER): Payer: Medicare Other

## 2018-11-21 ENCOUNTER — Ambulatory Visit: Payer: Medicare Other | Admitting: Family Medicine

## 2018-11-21 ENCOUNTER — Other Ambulatory Visit: Payer: Self-pay | Admitting: Family Medicine

## 2018-11-21 VITALS — BP 147/77 | HR 61 | Temp 98.0°F | Ht 68.0 in | Wt 204.8 lb

## 2018-11-21 DIAGNOSIS — M545 Low back pain: Secondary | ICD-10-CM | POA: Diagnosis not present

## 2018-11-21 DIAGNOSIS — S40011A Contusion of right shoulder, initial encounter: Secondary | ICD-10-CM | POA: Diagnosis not present

## 2018-11-21 DIAGNOSIS — W19XXXA Unspecified fall, initial encounter: Secondary | ICD-10-CM

## 2018-11-21 DIAGNOSIS — S300XXA Contusion of lower back and pelvis, initial encounter: Secondary | ICD-10-CM

## 2018-11-21 MED ORDER — TIZANIDINE HCL 6 MG PO CAPS: 6.0000 mg | ORAL_CAPSULE | Freq: Three times a day (TID) | ORAL | 1 refills | Status: DC | PRN

## 2018-11-21 MED ORDER — NABUMETONE 500 MG PO TABS: 1000.0000 mg | ORAL_TABLET | Freq: Two times a day (BID) | ORAL | 1 refills | Status: DC

## 2018-11-21 NOTE — Patient Instructions (Signed)

## 2018-11-21 NOTE — Progress Notes (Signed)
Chief Complaint  Patient presents with  . fell out of bed, injured right shoulder and back    having pain and muscle spasms    HPI  Patient presents today for fell out of bed 4 days ago.  Having some low back pain and some right shoulder pain.  She was having a rather vivid dream and fell 30 inches hitting a hardwood floor with her shoulder and back.  Now having 5-6/10 pain in the lower back.  Pain with range of motion for the right shoulder.  PMH: Smoking status noted ROS: Per HPI  Objective: BP (!) 147/77   Pulse 61   Temp 98 F (36.7 C) (Oral)   Ht 5\' 8"  (1.727 m)   Wt 204 lb 12.8 oz (92.9 kg)   BMI 31.14 kg/m  Gen: NAD, alert, cooperative with exam HEENT: NCAT, EOMI, PERRL CV: RRR, good S1/S2, no murmur Resp: CTABL, no wheezes, non-labored Abd: SNTND, BS present, no guarding or organomegaly Ext: No edema, warm.  There is pain with tenderness for external rotation of the right upper extremity past 60 degrees.  There is tenderness at the supraspinatus tendon.  There is 2+ bruising with some yellowish discoloration indicating that is beginning to resolve. Neuro: Alert and oriented, No gross deficits X-ray of the back read by radiology shows no apparent injury Assessment and plan:  1. Contusion of multiple sites of right shoulder, initial encounter   2. Lumbar contusion, initial encounter     Meds ordered this encounter  Medications  . nabumetone (RELAFEN) 500 MG tablet    Sig: Take 2 tablets (1,000 mg total) by mouth 2 (two) times daily. For muscle and joint pain    Dispense:  360 tablet    Refill:  1  . tizanidine (ZANAFLEX) 6 MG capsule    Sig: Take 1 capsule (6 mg total) by mouth 3 (three) times daily as needed for muscle spasms.    Dispense:  90 capsule    Refill:  1    Range of motion exercises recommended resting the joints as well recommended.  Follow up as needed.  Claretta Fraise, MD

## 2018-11-22 ENCOUNTER — Encounter: Payer: Self-pay | Admitting: Family Medicine

## 2018-11-24 ENCOUNTER — Other Ambulatory Visit: Payer: Self-pay | Admitting: *Deleted

## 2018-11-24 ENCOUNTER — Other Ambulatory Visit: Payer: Self-pay | Admitting: Family Medicine

## 2018-11-24 DIAGNOSIS — G8911 Acute pain due to trauma: Secondary | ICD-10-CM

## 2018-11-24 NOTE — Progress Notes (Signed)
MRI Ordered WS

## 2018-11-24 NOTE — Progress Notes (Signed)
May want to discuss with Dr Livia Snellen. Strong FH of osteoporosis and fractures in family

## 2018-11-24 NOTE — Progress Notes (Signed)
Pt is having a lot of pain from fall - was seen Friday and came to see STACKS. Low back is no better - wants MRI .  See notes from 11/21/18.  Can we order MRI

## 2018-11-24 NOTE — Progress Notes (Signed)
Pt aware MRI ordered.  

## 2018-11-25 ENCOUNTER — Ambulatory Visit (HOSPITAL_COMMUNITY)
Admission: RE | Admit: 2018-11-25 | Discharge: 2018-11-25 | Disposition: A | Discharge status: Home / Self Care | Payer: Medicare Other | Source: Ambulatory Visit | Attending: Family Medicine | Admitting: Family Medicine

## 2018-11-25 ENCOUNTER — Other Ambulatory Visit: Payer: Self-pay | Admitting: Family Medicine

## 2018-11-25 DIAGNOSIS — M48061 Spinal stenosis, lumbar region without neurogenic claudication: Secondary | ICD-10-CM | POA: Diagnosis not present

## 2018-11-25 DIAGNOSIS — S32010A Wedge compression fracture of first lumbar vertebra, initial encounter for closed fracture: Secondary | ICD-10-CM

## 2018-11-25 DIAGNOSIS — S32020A Wedge compression fracture of second lumbar vertebra, initial encounter for closed fracture: Secondary | ICD-10-CM

## 2018-11-25 DIAGNOSIS — G8911 Acute pain due to trauma: Secondary | ICD-10-CM | POA: Diagnosis not present

## 2018-11-25 DIAGNOSIS — M5136 Other intervertebral disc degeneration, lumbar region: Secondary | ICD-10-CM | POA: Insufficient documentation

## 2018-11-25 DIAGNOSIS — M4856XA Collapsed vertebra, not elsewhere classified, lumbar region, initial encounter for fracture: Secondary | ICD-10-CM | POA: Insufficient documentation

## 2018-11-25 NOTE — Progress Notes (Signed)
Two minimal compression fractures. Spinal stenosis - severe at L3-4. Needs ortho consult. Referral ordered, urgent. WS

## 2018-12-01 ENCOUNTER — Other Ambulatory Visit: Payer: Self-pay | Admitting: *Deleted

## 2018-12-11 ENCOUNTER — Other Ambulatory Visit: Payer: Self-pay | Admitting: Obstetrics and Gynecology

## 2018-12-11 DIAGNOSIS — R102 Pelvic and perineal pain: Secondary | ICD-10-CM

## 2018-12-15 ENCOUNTER — Telehealth: Payer: Self-pay | Admitting: Family Medicine

## 2018-12-15 ENCOUNTER — Other Ambulatory Visit: Payer: Self-pay | Admitting: *Deleted

## 2018-12-15 MED ORDER — POLYMYXIN B-TRIMETHOPRIM 10000-0.1 UNIT/ML-% OP SOLN: 2.0000 [drp] | OPHTHALMIC | 0 refills | Status: DC

## 2018-12-15 NOTE — Telephone Encounter (Signed)
Please make sure that prescription for eyedrops and antibiotic was called in for this patient

## 2018-12-15 NOTE — Telephone Encounter (Signed)
Pt aware.

## 2018-12-15 NOTE — Telephone Encounter (Signed)
Med was sent in

## 2018-12-16 ENCOUNTER — Other Ambulatory Visit: Payer: Medicare Other

## 2018-12-18 ENCOUNTER — Encounter (INDEPENDENT_AMBULATORY_CARE_PROVIDER_SITE_OTHER): Payer: Self-pay | Admitting: Physical Medicine and Rehabilitation

## 2018-12-18 ENCOUNTER — Ambulatory Visit (INDEPENDENT_AMBULATORY_CARE_PROVIDER_SITE_OTHER): Payer: Medicare Other | Admitting: Physical Medicine and Rehabilitation

## 2018-12-18 VITALS — BP 164/73 | HR 57 | Ht 69.0 in | Wt 195.0 lb

## 2018-12-18 DIAGNOSIS — S32020A Wedge compression fracture of second lumbar vertebra, initial encounter for closed fracture: Secondary | ICD-10-CM | POA: Diagnosis not present

## 2018-12-18 DIAGNOSIS — M47816 Spondylosis without myelopathy or radiculopathy, lumbar region: Secondary | ICD-10-CM

## 2018-12-18 DIAGNOSIS — M545 Low back pain: Secondary | ICD-10-CM

## 2018-12-18 DIAGNOSIS — M48061 Spinal stenosis, lumbar region without neurogenic claudication: Secondary | ICD-10-CM | POA: Diagnosis not present

## 2018-12-18 DIAGNOSIS — G8929 Other chronic pain: Secondary | ICD-10-CM

## 2018-12-18 DIAGNOSIS — S32010A Wedge compression fracture of first lumbar vertebra, initial encounter for closed fracture: Secondary | ICD-10-CM

## 2018-12-18 MED ORDER — TRAMADOL HCL 50 MG PO TABS: 50.0000 mg | ORAL_TABLET | Freq: Four times a day (QID) | ORAL | 0 refills | Status: AC | PRN

## 2018-12-18 NOTE — Progress Notes (Signed)
.  Numeric Pain Rating Scale and Functional Assessment Average Pain 6 Pain Right Now 4 My pain is constant, dull and aching Pain is worse with: walking, bending and some activites Pain improves with: medication   In the last MONTH (on 0-10 scale) has pain interfered with the following?  1. General activity like being  able to carry out your everyday physical activities such as walking, climbing stairs, carrying groceries, or moving a chair?  Rating(5)  2. Relation with others like being able to carry out your usual social activities and roles such as  activities at home, at work and in your community. Rating(5)  3. Enjoyment of life such that you have  been bothered by emotional problems such as feeling anxious, depressed or irritable?  Rating(3)

## 2018-12-19 ENCOUNTER — Ambulatory Visit
Admission: RE | Admit: 2018-12-19 | Discharge: 2018-12-19 | Disposition: A | Discharge status: Home / Self Care | Payer: Medicare Other | Source: Ambulatory Visit | Attending: Obstetrics and Gynecology | Admitting: Obstetrics and Gynecology

## 2018-12-19 DIAGNOSIS — R102 Pelvic and perineal pain: Secondary | ICD-10-CM

## 2018-12-19 MED ORDER — IOPAMIDOL (ISOVUE-300) INJECTION 61%
100.0000 mL | Freq: Once | INTRAVENOUS | Status: AC | PRN
  Administered 2018-12-19: 100 mL via INTRAVENOUS

## 2018-12-23 ENCOUNTER — Other Ambulatory Visit: Payer: Self-pay | Admitting: *Deleted

## 2018-12-23 MED ORDER — ROSUVASTATIN CALCIUM 20 MG PO TABS: 20.0000 mg | ORAL_TABLET | Freq: Every day | ORAL | 3 refills | Status: DC

## 2018-12-24 LAB — HM MAMMOGRAPHY

## 2019-01-20 ENCOUNTER — Encounter: Payer: Self-pay | Admitting: Physical Therapy

## 2019-01-20 ENCOUNTER — Ambulatory Visit: Payer: Medicare Other | Attending: Physical Medicine and Rehabilitation | Admitting: Physical Therapy

## 2019-01-20 DIAGNOSIS — R2681 Unsteadiness on feet: Secondary | ICD-10-CM | POA: Insufficient documentation

## 2019-01-20 DIAGNOSIS — M6281 Muscle weakness (generalized): Secondary | ICD-10-CM | POA: Insufficient documentation

## 2019-01-20 NOTE — Therapy (Addendum)
[facility] [place], [place], [address] Phone: [phone]   Fax:  [phone]  Physical Therapy Evaluation  Patient Details  Name: Shelly Bennett MRN: [id number] Date of Birth: 02/18/1951 Referring Provider (PT): Laurence Spates MD   Encounter Date: 01/20/2019  PT End of Session - 01/20/19 0933    Visit Number  1    Number of Visits  16    Date for PT Re-Evaluation  04/22/19    Authorization Type  KX MODIFIER AFTER 15 VISITS.  PROGRESS NOTE AT 10TH VISIT.    PT Start Time  0910    PT Stop Time  0941    PT Time Calculation (min)  31 min    Activity Tolerance  Patient tolerated treatment well    Behavior During Therapy  [facility] for tasks assessed/performed       Past Medical History:  Diagnosis Date  . Adenomatous colon polyp   . Allergy   . Anxiety   . Arthritis   . CAD (coronary artery disease)    Dr. Burt Knack follows   . Chronic headaches   . Cystocele   . Diverticulosis   . External hemorrhoids   . HLD (hyperlipidemia)   . HTN (hypertension)   . IBS (irritable bowel syndrome)   . Memory loss   . Obesity   . Pneumonia   . Skin cancer    squamous cell - face    Past Surgical History:  Procedure Laterality Date  . CARPAL TUNNEL RELEASE    . COLONOSCOPY  12/15/2008   diverticulosis, external hemorrhoids  . KNEE SURGERY  05/22/2015   right  . LAPAROSCOPY    . SKIN SURGERY     squamous cell - right face     There were no vitals filed for this visit.   Subjective Assessment - 01/20/19 1040    Subjective  The patient states she had a dream on 11/17/18 and came out of bed.  She reports a right shoulder injury and increased low back pain.  She states the pain come and goes. This also resulted in an L1-2 compression fracture.  She is to be fitted for a back brace.  She also is experiencing pain over her right anterior thigh.  Howvere, her CC is balance problems and she wants her therapy to be focused on this.       Pertinent History  H/o LBP, knee surgery, arthritis, HTN, memory loss.    Patient Stated Goals  Walk better.    Currently in Pain?  Yes    Pain Score  6     Pain Location  Back    Pain Orientation  Right;Left;Lower    Pain Descriptors / Indicators  Aching    Pain Type  Chronic pain         [facility] PT Assessment - 01/20/19 0001      Assessment   Medical Diagnosis  Gait abnormality.    Referring Provider (PT)  Laurence Spates MD    Onset Date/Surgical Date  --   11/18/19.     Precautions   Precautions  Fall      Restrictions   Weight Bearing Restrictions  No      Balance Screen   Has the patient fallen in the past 6 months  Yes    How many times?  --   Patient couldn't recall.   Has the patient had a decrease in activity level because of a fear of falling?  Yes    Is the patient reluctant to leave their home because of a fear of falling?   Yes      [place] residence      Prior Function   Level of Independence  Independent      Posture/Postural Control   Posture/Postural Control  No significant limitations      ROM / Strength   AROM / PROM / Strength  Strength      Strength   Overall Strength Comments  Normal bilateral LE strength.      Special Tests    Special Tests  --   Normal Patellar reflexes.  Absent Achilles.     Ambulation/Gait   Gait Comments  The patient's gait presents with some ataxia.  Recommend cane at all times.  She is not using an assistive device.  Large base of support.      Standardized Balance Assessment   Standardized Balance Assessment  Berg Balance Test   Mildly positive Romberg test.     Berg Balance Test   Sit to Stand  Able to stand  independently using hands    Standing Unsupported  Able to stand 2 minutes with supervision    Sitting with Back Unsupported but Feet Supported on Floor or Stool  Able to sit 2 minutes under supervision    Stand to Sit  Sits safely with minimal use of hands     Transfers  Able to transfer safely, definite need of hands    Standing Unsupported with Eyes Closed  Able to stand 10 seconds with supervision    Standing Ubsupported with Feet Together  Able to place feet together independently and stand for 1 minute with supervision    From Standing, Reach Forward with Outstretched Arm  Can reach confidently >25 cm (10")    From Standing Position, Pick up Object from Floor  Able to pick up shoe, needs supervision    From Standing Position, Turn to Look Behind Over each Shoulder  Looks behind one side only/other side shows less weight shift    Turn 360 Degrees  Able to turn 360 degrees safely one side only in 4 seconds or less    Standing Unsupported, Alternately Place Feet on Step/Stool  Able to complete 4 steps without aid or supervision    Standing Unsupported, One Foot in Front  Able to take small step independently and hold 30 seconds    Standing on One Leg  Able to lift leg independently and hold equal to or more than 3 seconds    Total Score  41                Objective measurements completed on examination: See above findings.                PT Short Term Goals - 01/20/19 1211      PT SHORT TERM GOAL #1   Title  STG's=LTG's.        PT Long Term Goals - 01/20/19 1211      PT LONG TERM GOAL #1   Title  Ind with HEP.    Time  8    Period  Weeks    Status  New      PT LONG TERM GOAL #2   Title  Berg score= 50-51/56.    Time  8    Period  Weeks    Status  New      PT LONG  TERM GOAL #3   Title  Negative Romberg test.    Time  8    Period  Weeks    Status  New             Plan - 01/20/19 1110    Clinical Impression Statement  The patient presents to OPPT with c/o balance problems.  She fell from her bed on 11/18/19.  She sustained an L1-2 compresiion fracture and is to be fitted for a back brace.  Her Berg balance score is a 41/56.  She needs to be using a cane at all times but is not. Patient will benefit  from skilled physical therapy intervention to address deficits    History and Personal Factors relevant to plan of care:  H/o LBP, knee surgery, arthritis, HTN, memory loss.    Clinical Presentation  Evolving    Clinical Presentation due to:  Not improving.    Clinical Decision Making  Moderate    Rehab Potential  Good    PT Frequency  2x / week    PT Duration  8 weeks    PT Treatment/Interventions  ADLs/Self Care Home Management;Gait training;Stair training;Therapeutic activities;Therapeutic exercise;Patient/family education;Neuromuscular re-education;Balance training    PT Next Visit Plan  Gait and balance activities.  Core exercises.    Consulted and Agree with Plan of Care  Patient       Patient will benefit from skilled therapeutic intervention in order to improve the following deficits and impairments:  Abnormal gait, Decreased activity tolerance, Decreased balance, Pain  Visit Diagnosis: Unsteadiness on feet - Plan: PT plan of care cert/re-cert     Problem List Patient Active Problem List   Diagnosis Date Noted  . Trigger thumb, right thumb 10/29/2018  . Memory loss 07/22/2018  . Low back pain 07/22/2018  . Left lumbar radiculopathy 01/22/2018  . Mild cognitive impairment 10/22/2017  . Anxiety 10/22/2017  . Otitis of left ear 02/01/2017  . Aortic atherosclerosis ([place]) 12/04/2016  . Allergic drug reaction 06/25/2015  . Dehydration with hyponatremia 06/25/2015  . HCAP (healthcare-associated pneumonia) 06/25/2015  . Vitamin D deficiency 08/19/2014  . Family history of colon cancer-father at 27+ grandparent w/ rectal cancer 01/14/2014  . Osteopenia 08/12/2013  . CAD (coronary artery disease), native coronary artery 03/22/2013  . Aortic valve stenosis, mild 03/22/2013  . Hyperlipemia 03/11/2013  . Hypertension 03/11/2013  . Allergic rhinitis 03/11/2013  . Left shoulder pain 03/11/2013  . IBS (irritable bowel syndrome) - with chronic recurrent abdominal pain 02/01/2012   . Personal history of colonic polyps - adenoma 02/01/2012  . Coccydynia 02/01/2012    APPLEGATE, [place] 01/20/2019, 1:43 PM  [facility] [address] [place], [place], [address] Phone: [phone]   Fax:  [phone]  Name: Shelly Bennett MRN: [id number] Date of Birth: [birth year]

## 2019-01-22 ENCOUNTER — Ambulatory Visit: Payer: Medicare Other | Admitting: *Deleted

## 2019-01-22 DIAGNOSIS — R2681 Unsteadiness on feet: Secondary | ICD-10-CM

## 2019-01-22 DIAGNOSIS — M6281 Muscle weakness (generalized): Secondary | ICD-10-CM

## 2019-01-22 NOTE — Therapy (Signed)
[facility] [place], [place], [address] Phone: [phone]   Fax:  [phone]  Physical Therapy Treatment  Patient Details  Name: Shelly Bennett MRN: [id number] Date of Birth: 09/03/1951 Referring Provider (PT): Laurence Spates MD   Encounter Date: 01/22/2019  PT End of Session - 01/22/19 1311    Visit Number  2    Number of Visits  16    Date for PT Re-Evaluation  04/22/19    Authorization Type  KX MODIFIER AFTER 15 VISITS.  PROGRESS NOTE AT 10TH VISIT.    PT Start Time  0900    PT Stop Time  0950    PT Time Calculation (min)  50 min       Past Medical History:  Diagnosis Date  . Adenomatous colon polyp   . Allergy   . Anxiety   . Arthritis   . CAD (coronary artery disease)    Dr. Burt Knack follows   . Chronic headaches   . Cystocele   . Diverticulosis   . External hemorrhoids   . HLD (hyperlipidemia)   . HTN (hypertension)   . IBS (irritable bowel syndrome)   . Memory loss   . Obesity   . Pneumonia   . Skin cancer    squamous cell - face    Past Surgical History:  Procedure Laterality Date  . CARPAL TUNNEL RELEASE    . COLONOSCOPY  12/15/2008   diverticulosis, external hemorrhoids  . KNEE SURGERY  05/22/2015   right  . LAPAROSCOPY    . SKIN SURGERY     squamous cell - right face     There were no vitals filed for this visit.  Subjective Assessment - 01/22/19 0908    Subjective  Did ok after Eval.  Need to get fitted for brace soon for LB    Pertinent History  H/o LBP, knee surgery, arthritis, HTN, memory loss.    Patient Stated Goals  Walk better.    Currently in Pain?  Yes    Pain Score  6     Pain Location  Back    Pain Orientation  Right;Left;Lower    Pain Descriptors / Indicators  Aching    Pain Type  Chronic pain                       [facility] Adult PT Treatment/Exercise - 01/22/19 0001      Exercises   Exercises  Lumbar;Knee/Hip      Lumbar Exercises: Aerobic   Nustep  L4 x 15 mins, 50-55 steps per minute.          Balance Exercises - 01/22/19 0912      Balance Exercises: Standing   Standing Eyes Closed  Narrow base of support (BOS);Solid surface;3 reps    Tandem Stance  Eyes closed;Eyes open;10 secs   x3 with LT foot forward/ x 3 with RT foot forward   Rockerboard  Anterior/posterior;Intermittent UE support   manual CGA   Other Standing Exercises  toe taps 6in box 2x20, from the side 2x10  Bil,  colored pod touching with each LE as requested x 3 rounds          PT Short Term Goals - 01/20/19 1211      PT SHORT TERM GOAL #1   Title  STG's=LTG's.        PT Long Term Goals - 01/20/19 1211      PT LONG TERM GOAL #1  Title  Ind with HEP.    Time  8    Period  Weeks    Status  New      PT LONG TERM GOAL #2   Title  Berg score= 50-51/56.    Time  8    Period  Weeks    Status  New      PT LONG TERM GOAL #3   Title  Negative Romberg test.    Time  8    Period  Weeks    Status  New            Plan - 01/22/19 1316    Clinical Impression Statement  Pt arrived today feeling fair and  reports that she really wants to work on her balance. She did fairly well with ex and balance act's with eyes opened / closed. with CGA.. LBP also monitored during Rx.    Clinical Presentation  Evolving    Clinical Decision Making  Moderate    Rehab Potential  Good    PT Frequency  2x / week    PT Duration  8 weeks    PT Treatment/Interventions  ADLs/Self Care Home Management;Gait training;Stair training;Therapeutic activities;Therapeutic exercise;Patient/family education;Neuromuscular re-education;Balance training    PT Next Visit Plan  Gait and balance activities.  Core exercises.    Consulted and Agree with Plan of Care  Patient       Patient will benefit from skilled therapeutic intervention in order to improve the following deficits and impairments:  Abnormal gait, Decreased activity tolerance, Decreased balance, Pain  Visit  Diagnosis: Unsteadiness on feet  Muscle weakness (generalized)     Problem List Patient Active Problem List   Diagnosis Date Noted  . Trigger thumb, right thumb 10/29/2018  . Memory loss 07/22/2018  . Low back pain 07/22/2018  . Left lumbar radiculopathy 01/22/2018  . Mild cognitive impairment 10/22/2017  . Anxiety 10/22/2017  . Otitis of left ear 02/01/2017  . Aortic atherosclerosis ([place]) 12/04/2016  . Allergic drug reaction 06/25/2015  . Dehydration with hyponatremia 06/25/2015  . HCAP (healthcare-associated pneumonia) 06/25/2015  . Vitamin D deficiency 08/19/2014  . Family history of colon cancer-father at 50+ grandparent w/ rectal cancer 01/14/2014  . Osteopenia 08/12/2013  . CAD (coronary artery disease), native coronary artery 03/22/2013  . Aortic valve stenosis, mild 03/22/2013  . Hyperlipemia 03/11/2013  . Hypertension 03/11/2013  . Allergic rhinitis 03/11/2013  . Left shoulder pain 03/11/2013  . IBS (irritable bowel syndrome) - with chronic recurrent abdominal pain 02/01/2012  . Personal history of colonic polyps - adenoma 02/01/2012  . Coccydynia 02/01/2012    Babygirl Trager,CHRIS, PTA 01/22/2019, 1:23 PM  [facility] [place], [place], [address] Phone: [phone]   Fax:  [phone]  Name: Shelly Bennett MRN: [id number] Date of Birth: 07-Jun-1951

## 2019-01-26 ENCOUNTER — Encounter: Payer: Self-pay | Admitting: Physical Therapy

## 2019-01-26 ENCOUNTER — Ambulatory Visit: Payer: Medicare Other | Admitting: Physical Therapy

## 2019-01-26 DIAGNOSIS — M6281 Muscle weakness (generalized): Secondary | ICD-10-CM

## 2019-01-26 DIAGNOSIS — R2681 Unsteadiness on feet: Secondary | ICD-10-CM | POA: Diagnosis not present

## 2019-01-26 NOTE — Therapy (Signed)
[facility] [place], [place], [address] Phone: [phone]   Fax:  [phone]  Physical Therapy Treatment  Patient Details  Name: Shelly Bennett MRN: [id number] Date of Birth: 01-04-1951 Referring Provider (PT): Laurence Spates MD   Encounter Date: 01/26/2019  PT End of Session - 01/26/19 1401    Visit Number  3    Number of Visits  16    Date for PT Re-Evaluation  04/22/19    Authorization Type  KX MODIFIER AFTER 15 VISITS.  PROGRESS NOTE AT 10TH VISIT.    PT Start Time  0101    PT Stop Time  0146    PT Time Calculation (min)  45 min    Activity Tolerance  Patient tolerated treatment well    Behavior During Therapy  [facility] for tasks assessed/performed       Past Medical History:  Diagnosis Date  . Adenomatous colon polyp   . Allergy   . Anxiety   . Arthritis   . CAD (coronary artery disease)    Dr. Burt Knack follows   . Chronic headaches   . Cystocele   . Diverticulosis   . External hemorrhoids   . HLD (hyperlipidemia)   . HTN (hypertension)   . IBS (irritable bowel syndrome)   . Memory loss   . Obesity   . Pneumonia   . Skin cancer    squamous cell - face    Past Surgical History:  Procedure Laterality Date  . CARPAL TUNNEL RELEASE    . COLONOSCOPY  12/15/2008   diverticulosis, external hemorrhoids  . KNEE SURGERY  05/22/2015   right  . LAPAROSCOPY    . SKIN SURGERY     squamous cell - right face     There were no vitals filed for this visit.  Subjective Assessment - 01/26/19 1403    Subjective  No new complaints.    Patient Stated Goals  Walk better.    Pain Score  6     Pain Location  Back    Pain Orientation  Right;Left;Lower    Pain Descriptors / Indicators  Aching                       [facility] Adult PT Treatment/Exercise - 01/26/19 0001      Exercises   Exercises  Knee/Hip      Lumbar Exercises: Aerobic   Nustep  Level 4 x 16 minutes.      Knee/Hip Exercises:  Machines for Strengthening   Cybex Knee Extension  10# x 3 minutes (90-45 degrees.Marland Kitchenno pain).    Cybex Knee Flexion  30# x 3 minutes.          Balance Exercises - 01/26/19 1411      Balance Exercises: Standing   Other Standing Exercises  In parallel bars:  Rockerboard x 5 minutes and inverted BOSU x 5 minutes with tactile cues and hands use by patient as needed f/b resisted walking 3 minutes forward and 3 minutes backward resisted by orange XTS band.          PT Short Term Goals - 01/20/19 1211      PT SHORT TERM GOAL #1   Title  STG's=LTG's.        PT Long Term Goals - 01/20/19 1211      PT LONG TERM GOAL #1   Title  Ind with HEP.    Time  8    Period  Weeks  Status  New      PT LONG TERM GOAL #2   Title  Berg score= 50-51/56.    Time  8    Period  Weeks    Status  New      PT LONG TERM GOAL #3   Title  Negative Romberg test.    Time  8    Period  Weeks    Status  New            Plan - 01/26/19 1412    Clinical Impression Statement  Patient did well today.  Required tactile support for balance activities.    PT Treatment/Interventions  ADLs/Self Care Home Management;Gait training;Stair training;Therapeutic activities;Therapeutic exercise;Patient/family education;Neuromuscular re-education;Balance training    PT Next Visit Plan  Gait and balance activities.  Core exercises.    Consulted and Agree with Plan of Care  Patient       Patient will benefit from skilled therapeutic intervention in order to improve the following deficits and impairments:  Abnormal gait, Decreased activity tolerance, Decreased balance, Pain  Visit Diagnosis: Unsteadiness on feet  Muscle weakness (generalized)     Problem List Patient Active Problem List   Diagnosis Date Noted  . Trigger thumb, right thumb 10/29/2018  . Memory loss 07/22/2018  . Low back pain 07/22/2018  . Left lumbar radiculopathy 01/22/2018  . Mild cognitive impairment 10/22/2017  . Anxiety  10/22/2017  . Otitis of left ear 02/01/2017  . Aortic atherosclerosis ([place]) 12/04/2016  . Allergic drug reaction 06/25/2015  . Dehydration with hyponatremia 06/25/2015  . HCAP (healthcare-associated pneumonia) 06/25/2015  . Vitamin D deficiency 08/19/2014  . Family history of colon cancer-father at 34+ grandparent w/ rectal cancer 01/14/2014  . Osteopenia 08/12/2013  . CAD (coronary artery disease), native coronary artery 03/22/2013  . Aortic valve stenosis, mild 03/22/2013  . Hyperlipemia 03/11/2013  . Hypertension 03/11/2013  . Allergic rhinitis 03/11/2013  . Left shoulder pain 03/11/2013  . IBS (irritable bowel syndrome) - with chronic recurrent abdominal pain 02/01/2012  . Personal history of colonic polyps - adenoma 02/01/2012  . Coccydynia 02/01/2012    Marlow Hendrie, [place] MPT 01/26/2019, 2:21 PM  [facility] [address] [place], [place], [address] Phone: [phone]   Fax:  [phone]  Name: Shelly Bennett MRN: [id number] Date of Birth: 1951/08/05

## 2019-02-02 ENCOUNTER — Ambulatory Visit: Payer: Medicare Other | Admitting: Physical Therapy

## 2019-02-05 ENCOUNTER — Ambulatory Visit (INDEPENDENT_AMBULATORY_CARE_PROVIDER_SITE_OTHER): Payer: Medicare Other

## 2019-02-05 ENCOUNTER — Encounter: Payer: Self-pay | Admitting: Family Medicine

## 2019-02-05 ENCOUNTER — Ambulatory Visit: Payer: Medicare Other | Admitting: Physical Therapy

## 2019-02-05 ENCOUNTER — Encounter: Payer: Self-pay | Admitting: Physical Therapy

## 2019-02-05 ENCOUNTER — Ambulatory Visit: Payer: Medicare Other | Admitting: Family Medicine

## 2019-02-05 VITALS — BP 135/75 | HR 62 | Temp 98.6°F | Ht 69.0 in | Wt 205.0 lb

## 2019-02-05 DIAGNOSIS — R413 Other amnesia: Secondary | ICD-10-CM

## 2019-02-05 DIAGNOSIS — M6281 Muscle weakness (generalized): Secondary | ICD-10-CM

## 2019-02-05 DIAGNOSIS — E559 Vitamin D deficiency, unspecified: Secondary | ICD-10-CM | POA: Diagnosis not present

## 2019-02-05 DIAGNOSIS — I2510 Atherosclerotic heart disease of native coronary artery without angina pectoris: Secondary | ICD-10-CM

## 2019-02-05 DIAGNOSIS — R2681 Unsteadiness on feet: Secondary | ICD-10-CM

## 2019-02-05 DIAGNOSIS — I10 Essential (primary) hypertension: Secondary | ICD-10-CM

## 2019-02-05 DIAGNOSIS — R05 Cough: Secondary | ICD-10-CM

## 2019-02-05 DIAGNOSIS — E7800 Pure hypercholesterolemia, unspecified: Secondary | ICD-10-CM | POA: Diagnosis not present

## 2019-02-05 DIAGNOSIS — G3184 Mild cognitive impairment, so stated: Secondary | ICD-10-CM

## 2019-02-05 DIAGNOSIS — R935 Abnormal findings on diagnostic imaging of other abdominal regions, including retroperitoneum: Secondary | ICD-10-CM

## 2019-02-05 DIAGNOSIS — I700 Atherosclerosis of aorta: Secondary | ICD-10-CM

## 2019-02-05 DIAGNOSIS — J019 Acute sinusitis, unspecified: Secondary | ICD-10-CM

## 2019-02-05 DIAGNOSIS — K769 Liver disease, unspecified: Secondary | ICD-10-CM

## 2019-02-05 DIAGNOSIS — K219 Gastro-esophageal reflux disease without esophagitis: Secondary | ICD-10-CM

## 2019-02-05 DIAGNOSIS — G4489 Other headache syndrome: Secondary | ICD-10-CM

## 2019-02-05 DIAGNOSIS — R059 Cough, unspecified: Secondary | ICD-10-CM

## 2019-02-05 LAB — URINALYSIS, COMPLETE
Bilirubin, UA: NEGATIVE
Glucose, UA: NEGATIVE
Ketones, UA: NEGATIVE
Nitrite, UA: NEGATIVE
Protein, UA: NEGATIVE
Specific Gravity, UA: 1.02 (ref 1.005–1.030)
Urobilinogen, Ur: 0.2 mg/dL (ref 0.2–1.0)
pH, UA: 8.5 — ABNORMAL HIGH (ref 5.0–7.5)

## 2019-02-05 LAB — MICROSCOPIC EXAMINATION
Bacteria, UA: NONE SEEN
Renal Epithel, UA: NONE SEEN /hpf

## 2019-02-05 MED ORDER — CEFDINIR 300 MG PO CAPS: 300.0000 mg | ORAL_CAPSULE | Freq: Two times a day (BID) | ORAL | 0 refills | Status: DC

## 2019-02-05 NOTE — Therapy (Addendum)
[facility] [place], [place], [address] Phone: [phone]   Fax:  [phone]  Physical Therapy Treatment  Patient Details  Name: Shelly Bennett MRN: [id number] Date of Birth: October 05, 1951 Referring Provider (PT): Laurence Spates MD   Encounter Date: 02/05/2019  PT End of Session - 02/05/19 1104    Visit Number  4    Number of Visits  16    Date for PT Re-Evaluation  04/22/19    Authorization Type  KX MODIFIER AFTER 15 VISITS.  PROGRESS NOTE AT 10TH VISIT.    PT Start Time  1029    PT Stop Time  1113    PT Time Calculation (min)  44 min    Activity Tolerance  Patient tolerated treatment well    Behavior During Therapy  [facility] for tasks assessed/performed       Past Medical History:  Diagnosis Date  . Adenomatous colon polyp   . Allergy   . Anxiety   . Arthritis   . CAD (coronary artery disease)    Dr. Burt Knack follows   . Chronic headaches   . Cystocele   . Diverticulosis   . External hemorrhoids   . HLD (hyperlipidemia)   . HTN (hypertension)   . IBS (irritable bowel syndrome)   . Memory loss   . Obesity   . Pneumonia   . Skin cancer    squamous cell - face    Past Surgical History:  Procedure Laterality Date  . CARPAL TUNNEL RELEASE    . COLONOSCOPY  12/15/2008   diverticulosis, external hemorrhoids  . KNEE SURGERY  05/22/2015   right  . LAPAROSCOPY    . SKIN SURGERY     squamous cell - right face     There were no vitals filed for this visit.  Subjective Assessment - 02/05/19 1035    Subjective  Patient has been sick and still sick today, went to MD and is on medication, would like to try therapy today    Pertinent History  H/o LBP, knee surgery, arthritis, HTN, memory loss.    Patient Stated Goals  Walk better.    Currently in Pain?  Yes    Pain Score  5     Pain Location  Back    Pain Orientation  Right;Left;Lower    Pain Descriptors / Indicators  Aching    Pain Type  Chronic pain    Pain Onset  More than a month ago    Pain Frequency  Constant    Aggravating Factors   increased activity    Pain Relieving Factors  rest                       [facility] Adult PT Treatment/Exercise - 02/05/19 0001      Lumbar Exercises: Aerobic   Nustep  Level 4 x 24mn UE/LE activity      Knee/Hip Exercises: Machines for Strengthening   Cybex Knee Extension  10# 3x10    Cybex Knee Flexion  30# 3x10      Knee/Hip Exercises: Seated   Sit to Sand  10 reps;without UE support          Balance Exercises - 02/05/19 1055      Balance Exercises: Standing   Standing Eyes Opened  Narrow base of support (BOS);Foam/compliant surface;Time   436m   Standing, One Foot on a Step  Eyes open;8 inch;4 reps;10 secs    Rockerboard  Anterior/posterior;Intermittent UE support  37mn   Other Standing Exercises  resisted walking with pink XTS foward and back x662m          PT Short Term Goals - 01/20/19 1211      PT SHORT TERM GOAL #1   Title  STG's=LTG's.        PT Long Term Goals - 02/05/19 1112      PT LONG TERM GOAL #1   Title  Ind with HEP.    Time  8    Period  Weeks    Status  On-going      PT LONG TERM GOAL #2   Title  Berg score= 50-51/56.    Time  8    Period  Weeks    Status  On-going   NT 02/05/19     PT LONG TERM GOAL #3   Title  Negative Romberg test.    Time  8    Period  Weeks    Status  On-going   Positive today 02/05/19           Plan - 02/05/19 1113    Clinical Impression Statement  Patient tolerated treatment fair due to being sick. Patient able to complete all exercises with slow pace and rest as needed. Patient continues to have pain in back and radiating down into thigh. Patient has positive Rhomberg test today. Goals progressing.     Rehab Potential  Good    PT Frequency  2x / week    PT Duration  8 weeks    PT Treatment/Interventions  ADLs/Self Care Home Management;Gait training;Stair training;Therapeutic activities;Therapeutic  exercise;Patient/family education;Neuromuscular re-education;Balance training    PT Next Visit Plan  cont with Gait and balance activities.  Core and LE exercises per POC    Consulted and Agree with Plan of Care  Patient       Patient will benefit from skilled therapeutic intervention in order to improve the following deficits and impairments:  Abnormal gait, Decreased activity tolerance, Decreased balance, Pain  Visit Diagnosis: Unsteadiness on feet  Muscle weakness (generalized)     Problem List Patient Active Problem List   Diagnosis Date Noted  . Trigger thumb, right thumb 10/29/2018  . Memory loss 07/22/2018  . Low back pain 07/22/2018  . Left lumbar radiculopathy 01/22/2018  . Mild cognitive impairment 10/22/2017  . Anxiety 10/22/2017  . Otitis of left ear 02/01/2017  . Aortic atherosclerosis ([place]/19/2017  . Allergic drug reaction 06/25/2015  . Dehydration with hyponatremia 06/25/2015  . HCAP (healthcare-associated pneumonia) 06/25/2015  . Vitamin D deficiency 08/19/2014  . Family history of colon cancer-father at 5882+randparent w/ rectal cancer 01/14/2014  . Osteopenia 08/12/2013  . CAD (coronary artery disease), native coronary artery 03/22/2013  . Aortic valve stenosis, mild 03/22/2013  . Hyperlipemia 03/11/2013  . Hypertension 03/11/2013  . Allergic rhinitis 03/11/2013  . Left shoulder pain 03/11/2013  . IBS (irritable bowel syndrome) - with chronic recurrent abdominal pain 02/01/2012  . Personal history of colonic polyps - adenoma 02/01/2012  . Coccydynia 02/01/2012    ,  P, PTA 02/05/2019, 11:18 AM  [id number]: [phone] Fax:  [phone]: Shelly RunklesRN: [id number] of Birth: 07/1951-03-06PHYSICAL THERAPY DISCHARGE SUMMARY  Visits from Start of Care: 4.  Current functional level related to goals / functional  outcomes: See above.   Remaining deficits: See goal section.   Education / Equipment: HEP. Plan:  Patient agrees to discharge.  Patient goals were not met. Patient is being discharged due to not returning since the last visit.  ?????         [place] Applegate MPT

## 2019-02-05 NOTE — Progress Notes (Signed)
Subjective:    Patient ID: Shelly Bennett, female    DOB: 1951/02/24, 68 y.o.   MRN: [id number]  HPI Pt here for follow up and management of chronic medical problems which includes hypertension and hyperlipidemia. She is taking medication regularly.  2 of the patient's family members have contacted me about concerns for this patient and memory issues for her.  The family feels like this is been going on for over a year.  The patient has gone through a lot with losing her mother.  She is felt to be very forgetful and cannot focus on completing even simple task according to her daughter.  She is constantly repeating herself and does not remember that she is already spoken to family members.  She is on organize and has occasionally even forgotten to pick up her grandchild at school.  She is irritable and gets upset with family members easily.  We did review the note when she saw the neurologist on August 6 and this was Dr. Jenny Reichmann.  He felt that she had mild cognitive impairment.  With his MMSE she got 28 out of 30.  She had an MRI of the brain in November 2018 which showed generalized atrophy.  She had an MMSE today again and got 29 out of 30.  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  She is up to date on her eye exam and describes no trouble with hearing.  She denies any chest pain.  She does have shortness of breath associated with this cough and congestion that have been going on for about a month.  There is no purulent sputum and she describes no fever.  She does have an inhaler but she is not using this regularly.  She is using nasal saline and Flonase.  She says she has some headache between her eyes.  Other than the cough and congestion she does not have any shortness of breath.  She is also past due on her  colonoscopy because of her positive family history for colon cancer and we will contact her gastroenterologist about this and make sure that she get scheduled for colonoscopy.  She has had more frequent bowel movements and more frequency with passing her water but no burning.  She has not seen any blood in the stool or had any black tarry bowel movements.    Patient Active Problem List   Diagnosis Date Noted  . Trigger thumb, right thumb 10/29/2018  . Memory loss 07/22/2018  . Low back pain 07/22/2018  . Left lumbar radiculopathy 01/22/2018  . Mild cognitive impairment 10/22/2017  . Anxiety 10/22/2017  . Otitis of left ear 02/01/2017  . Aortic atherosclerosis ([place]) 12/04/2016  . Allergic drug reaction 06/25/2015  . Dehydration with hyponatremia 06/25/2015  . HCAP (healthcare-associated pneumonia) 06/25/2015  . Vitamin D deficiency 08/19/2014  . Family history of colon cancer-father at 40+ grandparent w/ rectal cancer 01/14/2014  . Osteopenia 08/12/2013  . CAD (coronary artery disease), native coronary artery 03/22/2013  . Aortic valve stenosis, mild 03/22/2013  . Hyperlipemia 03/11/2013  . Hypertension 03/11/2013  . Allergic rhinitis 03/11/2013  . Left shoulder pain 03/11/2013  . IBS (irritable bowel syndrome) - with chronic recurrent abdominal pain 02/01/2012  . Personal history of colonic polyps - adenoma 02/01/2012  . Coccydynia 02/01/2012  Outpatient Encounter Medications as of 02/05/2019  Medication Sig  . amLODipine (NORVASC) 10 MG tablet TAKE (1/2) TABLET DAILY.  Marland Kitchen aspirin 81 MG tablet Take 81 mg by mouth daily.   . calcium carbonate 200 MG capsule Take 250 mg by mouth 2 (two) times daily.   . Cholecalciferol (VITAMIN D-3) 5000 UNITS TABS Take 1 capsule by mouth as directed. Take one capsule by mouth daily Mon thru Friday  . fexofenadine (ALLEGRA) 180 MG tablet Take 180 mg by mouth daily.  . fluticasone (FLONASE) 50 MCG/ACT nasal spray Place 2 sprays into both nostrils  daily.  . hydrochlorothiazide (HYDRODIURIL) 25 MG tablet TAKE (1/2) TABLET DAILY.  Marland Kitchen omega-3 acid ethyl esters (LOVAZA) 1 g capsule TAKE (1) CAPSULE FOUR TIMES DAILY.  Marland Kitchen PARoxetine (PAXIL) 20 MG tablet Take 1.5 tablets (30 mg total) by mouth at bedtime.  . rosuvastatin (CRESTOR) 20 MG tablet Take 1 tablet (20 mg total) by mouth daily.  Marland Kitchen trimethoprim-polymyxin b (POLYTRIM) ophthalmic solution Place 2 drops into both eyes every 4 (four) hours.  Marland Kitchen albuterol (PROVENTIL HFA;VENTOLIN HFA) 108 (90 Base) MCG/ACT inhaler Inhale 2 puffs into the lungs every 6 (six) hours as needed for wheezing or shortness of breath. (Patient not taking: Reported on 02/05/2019)  . busPIRone (BUSPAR) 15 MG tablet Take 1 tablet (15 mg total) by mouth 2 (two) times daily as needed. (Patient not taking: Reported on 02/05/2019)  . [DISCONTINUED] fluticasone furoate-vilanterol (BREO ELLIPTA) 100-25 MCG/INH AEPB Inhale 1 puff into the lungs daily as needed (wheezing).  . [DISCONTINUED] Icosapent Ethyl (VASCEPA) 1 g CAPS Take 2 capsules (2 g total) by mouth 2 (two) times daily. (Patient not taking: Reported on 11/21/2018)  . [DISCONTINUED] nabumetone (RELAFEN) 500 MG tablet Take 2 tablets (1,000 mg total) by mouth 2 (two) times daily. For muscle and joint pain   No facility-administered encounter medications on file as of 02/05/2019.      Review of Systems  Constitutional: Negative.   HENT: Positive for congestion and sinus pressure.   Eyes: Negative.   Respiratory: Positive for cough.   Cardiovascular: Negative.   Gastrointestinal: Negative.   Endocrine: Negative.   Genitourinary: Negative.   Musculoskeletal: Negative.   Skin: Negative.   Allergic/Immunologic: Negative.   Neurological: Negative.   Hematological: Negative.   Psychiatric/Behavioral: Positive for confusion (memory changes per family ).       Objective:   Physical Exam Vitals signs and nursing note reviewed.  Constitutional:      General: She is not in  acute distress.    Appearance: Normal appearance. She is well-developed. She is ill-appearing.  HENT:     Head: Normocephalic and atraumatic.     Right Ear: Tympanic membrane, ear canal and external ear normal. There is no impacted cerumen.     Left Ear: Tympanic membrane, ear canal and external ear normal. There is no impacted cerumen.     Nose: Congestion present.     Comments: All sinuses tender to palpation but most tender in ethmoid sinus area.    Mouth/Throat:     Mouth: Mucous membranes are moist.     Pharynx: Oropharynx is clear. No oropharyngeal exudate or posterior oropharyngeal erythema.  Eyes:     General: No scleral icterus.       Right eye: No discharge.        Left eye: No discharge.     Extraocular Movements: Extraocular movements intact.     Conjunctiva/sclera: Conjunctivae normal.     Pupils: Pupils are  equal, round, and reactive to light.     Comments: Patient is up-to-date on eye exams  Neck:     Musculoskeletal: Normal range of motion and neck supple. No muscular tenderness.     Thyroid: No thyromegaly.     Vascular: No carotid bruit or JVD.  Cardiovascular:     Rate and Rhythm: Normal rate and regular rhythm.     Pulses: Normal pulses.     Heart sounds: Normal heart sounds. No murmur.     Comments: The heart is regular at 72/min with good pedal pulses and no edema Pulmonary:     Effort: Pulmonary effort is normal. No respiratory distress.     Breath sounds: Normal breath sounds. No wheezing or rales.     Comments: No wheezes rales or rhonchi.  Good breath sounds bilaterally and anteriorly and posteriorly.  Dry cough. Chest:     Chest wall: No tenderness.  Abdominal:     General: Abdomen is flat. Bowel sounds are normal.     Palpations: Abdomen is soft. There is no mass.     Tenderness: There is abdominal tenderness.     Comments: Slight epigastric and suprapubic tenderness with no organ enlargement or masses or bruits.  Musculoskeletal: Normal range of  motion.        General: No tenderness.     Right lower leg: No edema.     Left lower leg: No edema.     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  Lymphadenopathy:     Cervical: No cervical adenopathy.  Skin:    General: Skin is warm and dry.     Findings: No rash.  Neurological:     Mental Status: She is alert and oriented to person, place, and time. Mental status is at baseline.     Cranial Nerves: No cranial nerve deficit.     Sensory: No sensory deficit.     Gait: Gait abnormal.     Deep Tendon Reflexes: Reflexes abnormal.     Comments: Reflex in the lower extremity slightly decreased on the right compared to the left.  On the MMSE patient did score 29 out of 30 today.  We did have a long discussion about the memory issue problem and we discussed with her how we would approach this by checking out things thoroughly watching her blood sugar and eating healthy and drinking more water and try to get more physical activity despite issues going on with the back.  Psychiatric:        Mood and Affect: Mood normal.        Behavior: Behavior normal.        Thought Content: Thought content normal.        Judgment: Judgment normal.     Comments: Mood affect and behavior coincide with patient's current physical condition with sinusitis and back pain.    BP 140/80 (BP Location: Left Arm)   Pulse 62   Temp 98.6 F (37 C) (Oral)   Ht 5' 9"  (1.753 m)   Wt 205 lb (93 kg)   BMI 30.27 kg/m         Assessment & Plan:  1. Pure hypercholesterolemia -Continue as aggressive therapeutic lifestyle changes as possible including diet and exercise - DG Chest 2 View; Future - CBC with Differential/Platelet - Lipid panel  2. Hypertension -Continue to watch blood pressure  closely keep weight down and watch sodium intake - DG Chest 2 View; Future - CBC with  Differential/Platelet - BMP8+EGFR - Hepatic function panel  3. Vitamin D deficiency -Continue vitamin D replacement pending results of lab work - CBC with Differential/Platelet - VITAMIN D 25 Hydroxy (Vit-D Deficiency, Fractures)  4. Arteriosclerotic cardiovascular disease (ASCVD) -Continue statin therapy pending results of lab work - [employer] 2 View; Future - CBC with Differential/Platelet - Lipid panel  5. Gastroesophageal reflux disease, esophagitis presence not specified -Continue with medication for reflux.  Patient is having no complaints of this today. - CBC with Differential/Platelet - Hepatic function panel  6. Memory loss -Patient scored 29 out of 30 on MMSE.  Based on family's interaction with patient she apparently has some mild cognitive impairment and the family is very concerned about this.  She has had a lot on her over the past 2 to 3 years worrying about family worrying about her mother taking care of her mother and on top of that taken care of her self with back pain issues. - Ambulatory referral to Neurology - DG Chest 2 View; Future - CBC with Differential/Platelet - Vitamin B12 - Thyroid Panel With TSH - Urinalysis, Complete  7. Aortic atherosclerosis ([place]) -Continue with aggressive therapeutic lifestyle changes and statin therapy pending results of lab work  8. Cough -Take Mucinex maximum strength, 1 twice daily large glass of water -Continue  to use nasal saline and Flonase  9. Acute rhinosinusitis -Omnicef 300 twice daily with food for 10 days  10. Mild cognitive impairment -Check objective lab information and get appointment for her at [facility] to the memory unit to further evaluate her.  11. Other headache syndrome -Most likely secondary to sinusitis  12. Liver disease -Patient had abnormal CT recommending MRI of abdomen with and without contrast - MR Abdomen W Wo Contrast; Future  13. Abnormal abdominal  CT scan -Get MRI as recommended by radiologist because of abnormal CT.  Meds ordered this encounter  Medications  . cefdinir (OMNICEF) 300 MG capsule    Sig: Take 1 capsule (300 mg total) by mouth 2 (two) times daily. 1 po BID    Dispense:  20 capsule    Refill:  0   Patient Instructions                       Medicare Annual Wellness Visit  [facility] and the medical providers at [facility] strive to bring you the best medical care.  In doing so we not only want to address your current medical conditions and concerns but also to detect new conditions early and prevent illness, disease and health-related problems.    Medicare offers a yearly Wellness Visit which allows our clinical staff to assess your need for preventative services including immunizations, lifestyle education, counseling to decrease risk of preventable diseases and screening for fall risk and other medical concerns.    This visit is provided free of charge (no copay) for all Medicare recipients. The clinical pharmacists at [facility] have begun to conduct these Wellness Visits which will also include a thorough review of all your medications.    As you primary medical provider recommend that you make an appointment for your Annual Wellness Visit if you have not done so already this year.  You may set up this appointment before you leave today or you may call back ([phone]) and schedule an  appointment.  Please make sure when you call that you mention that you are scheduling your Annual Wellness Visit with the clinical pharmacist so that the appointment may be made for the proper length of time.    Continue current medications. Continue good therapeutic lifestyle changes which include good diet and exercise. Fall precautions discussed with patient. If an FOBT was given today- please return it to our front desk. If you are over 30 years old - you may need Prevnar 59 or the adult  Pneumonia vaccine.  **Flu shots are available--- please call and schedule a FLU-CLINIC appointment**  After your visit with [place] today you will receive a survey in the mail or online from [employer] regarding your care with [place]. Please take a moment to fill this out. Your feedback is very important to [place] as you can help [place] better understand your patient needs as well as improve your experience and satisfaction. WE CARE ABOUT YOU!!!   You are due for COLONOSCOPY _ please contact Dr Carlean Purl for appointment  Drink plenty of fluids and stay well-hydrated Continue with nasal saline and Flonase Use albuterol inhaler as needed for wheezing and shortness of breath Take Mucinex maximum strength, blue and white in color, 1 twice daily with a large glass of water for cough and congestion Take antibiotic as directed Continue with physical therapy Follow-up with orthopedist for back issues and disc issues as planned We will arrange for you to have an appointment at the sticht center at [facility] to further evaluate the cognitive impairment issues We will call with lab work results as soon as those results become available including chest x-ray.  Arrie Senate MD

## 2019-02-05 NOTE — Patient Instructions (Addendum)
Medicare Annual Wellness Visit  [facility] and the medical providers at [facility] strive to bring you the best medical care.  In doing so we not only want to address your current medical conditions and concerns but also to detect new conditions early and prevent illness, disease and health-related problems.    Medicare offers a yearly Wellness Visit which allows our clinical staff to assess your need for preventative services including immunizations, lifestyle education, counseling to decrease risk of preventable diseases and screening for fall risk and other medical concerns.    This visit is provided free of charge (no copay) for all Medicare recipients. The clinical pharmacists at [facility] have begun to conduct these Wellness Visits which will also include a thorough review of all your medications.    As you primary medical provider recommend that you make an appointment for your Annual Wellness Visit if you have not done so already this year.  You may set up this appointment before you leave today or you may call back ([phone]) and schedule an appointment.  Please make sure when you call that you mention that you are scheduling your Annual Wellness Visit with the clinical pharmacist so that the appointment may be made for the proper length of time.    Continue current medications. Continue good therapeutic lifestyle changes which include good diet and exercise. Fall precautions discussed with patient. If an FOBT was given today- please return it to our front desk. If you are over 69 years old - you may need Prevnar 74 or the adult Pneumonia vaccine.  **Flu shots are available--- please call and schedule a FLU-CLINIC appointment**  After your visit with [place] today you will receive a survey in the mail or online from [employer] regarding your care with [place]. Please take a moment to fill this out. Your feedback is very  important to [place] as you can help [place] better understand your patient needs as well as improve your experience and satisfaction. WE CARE ABOUT YOU!!!   You are due for COLONOSCOPY _ please contact Dr Carlean Purl for appointment  Drink plenty of fluids and stay well-hydrated Continue with nasal saline and Flonase Use albuterol inhaler as needed for wheezing and shortness of breath Take Mucinex maximum strength, blue and white in color, 1 twice daily with a large glass of water for cough and congestion Take antibiotic as directed Continue with physical therapy Follow-up with orthopedist for back issues and disc issues as planned We will arrange for you to have an appointment at the sticht center at [facility] to further evaluate the cognitive impairment issues We will call with lab work results as soon as those results become available including chest x-ray.

## 2019-02-06 LAB — LIPID PANEL
Chol/HDL Ratio: 3.1 ratio (ref 0.0–4.4)
Cholesterol, Total: 174 mg/dL (ref 100–199)
HDL: 56 mg/dL (ref >39)
LDL Calculated: 97 mg/dL (ref 0–99)
Triglycerides: 106 mg/dL (ref 0–149)
VLDL Cholesterol Cal: 21 mg/dL (ref 5–40)

## 2019-02-06 LAB — CBC WITH DIFFERENTIAL/PLATELET
Basophils Absolute: 0.1 10*3/uL (ref 0.0–0.2)
Basos: 1 %
EOS (ABSOLUTE): 0.2 10*3/uL (ref 0.0–0.4)
Eos: 3 %
Hematocrit: 37.2 % (ref 34.0–46.6)
Hemoglobin: 12.6 g/dL (ref 11.1–15.9)
Immature Grans (Abs): 0.1 10*3/uL (ref 0.0–0.1)
Immature Granulocytes: 1 %
LYMPHS ABS: 1.4 10*3/uL (ref 0.7–3.1)
Lymphs: 23 %
MCH: 30.7 pg (ref 26.6–33.0)
MCHC: 33.9 g/dL (ref 31.5–35.7)
MCV: 91 fL (ref 79–97)
MONOS ABS: 0.4 10*3/uL (ref 0.1–0.9)
Monocytes: 6 %
NEUTROS ABS: 4.1 10*3/uL (ref 1.4–7.0)
Neutrophils: 66 %
Platelets: 335 10*3/uL (ref 150–450)
RBC: 4.11 x10E6/uL (ref 3.77–5.28)
RDW: 12.7 % (ref 11.7–15.4)
WBC: 6.1 10*3/uL (ref 3.4–10.8)

## 2019-02-06 LAB — HEPATIC FUNCTION PANEL
ALT: 18 IU/L (ref 0–32)
AST: 26 IU/L (ref 0–40)
Albumin: 4.2 g/dL (ref 3.8–4.8)
Alkaline Phosphatase: 83 IU/L (ref 39–117)
BILIRUBIN, DIRECT: 0.11 mg/dL (ref 0.00–0.40)
Bilirubin Total: 0.3 mg/dL (ref 0.0–1.2)
Total Protein: 6.5 g/dL (ref 6.0–8.5)

## 2019-02-06 LAB — BMP8+EGFR
BUN/Creatinine Ratio: 14 (ref 12–28)
BUN: 10 mg/dL (ref 8–27)
CO2: 24 mmol/L (ref 20–29)
Calcium: 9.9 mg/dL (ref 8.7–10.3)
Chloride: 106 mmol/L (ref 96–106)
Creatinine, Ser: 0.72 mg/dL (ref 0.57–1.00)
GFR calc Af Amer: 100 mL/min/{1.73_m2} (ref >59)
GFR calc non Af Amer: 86 mL/min/{1.73_m2} (ref >59)
Glucose: 100 mg/dL — ABNORMAL HIGH (ref 65–99)
POTASSIUM: 4.5 mmol/L (ref 3.5–5.2)
Sodium: 144 mmol/L (ref 134–144)

## 2019-02-06 LAB — THYROID PANEL WITH TSH
Free Thyroxine Index: 1.6 (ref 1.2–4.9)
T3 Uptake Ratio: 23 % — ABNORMAL LOW (ref 24–39)
T4, Total: 7 ug/dL (ref 4.5–12.0)
TSH: 2.04 u[IU]/mL (ref 0.450–4.500)

## 2019-02-06 LAB — VITAMIN D 25 HYDROXY (VIT D DEFICIENCY, FRACTURES): Vit D, 25-Hydroxy: 50.6 ng/mL (ref 30.0–100.0)

## 2019-02-06 LAB — VITAMIN B12: Vitamin B-12: 557 pg/mL (ref 232–1245)

## 2019-02-09 ENCOUNTER — Telehealth: Payer: Self-pay | Admitting: Family Medicine

## 2019-02-09 NOTE — Telephone Encounter (Signed)
spoke with pt - does not need neuro appt - needs [facility] center referral and - referral coordinate aware

## 2019-02-13 ENCOUNTER — Ambulatory Visit (HOSPITAL_COMMUNITY)
Admission: RE | Admit: 2019-02-13 | Discharge: 2019-02-13 | Disposition: A | Discharge status: Home / Self Care | Payer: Medicare Other | Source: Ambulatory Visit | Attending: Family Medicine | Admitting: Family Medicine

## 2019-02-13 DIAGNOSIS — K769 Liver disease, unspecified: Secondary | ICD-10-CM | POA: Insufficient documentation

## 2019-02-13 MED ORDER — GADOBUTROL 1 MMOL/ML IV SOLN
10.0000 mL | Freq: Once | INTRAVENOUS | Status: AC | PRN
  Administered 2019-02-13: 10 mL via INTRAVENOUS

## 2019-02-25 ENCOUNTER — Encounter: Payer: Self-pay | Admitting: Internal Medicine

## 2019-03-05 ENCOUNTER — Other Ambulatory Visit: Payer: Self-pay | Admitting: *Deleted

## 2019-03-05 MED ORDER — PREDNISONE 10 MG PO TABS: ORAL_TABLET | ORAL | 0 refills | Status: DC

## 2019-03-06 ENCOUNTER — Ambulatory Visit: Payer: Medicare Other | Admitting: Family Medicine

## 2019-04-15 ENCOUNTER — Telehealth: Payer: Self-pay

## 2019-04-15 NOTE — Telephone Encounter (Signed)
FYI, patient declined referral to neurologist.

## 2019-05-20 ENCOUNTER — Telehealth: Payer: Self-pay | Admitting: Family Medicine

## 2019-05-22 ENCOUNTER — Other Ambulatory Visit: Payer: Self-pay

## 2019-05-22 ENCOUNTER — Encounter: Payer: Self-pay | Admitting: Family Medicine

## 2019-05-22 ENCOUNTER — Ambulatory Visit (INDEPENDENT_AMBULATORY_CARE_PROVIDER_SITE_OTHER): Payer: Medicare Other | Admitting: Family Medicine

## 2019-05-22 ENCOUNTER — Ambulatory Visit (INDEPENDENT_AMBULATORY_CARE_PROVIDER_SITE_OTHER): Payer: Medicare Other

## 2019-05-22 VITALS — BP 128/79 | HR 84 | Temp 97.6°F | Wt 206.0 lb

## 2019-05-22 DIAGNOSIS — J301 Allergic rhinitis due to pollen: Secondary | ICD-10-CM | POA: Diagnosis not present

## 2019-05-22 DIAGNOSIS — W19XXXA Unspecified fall, initial encounter: Secondary | ICD-10-CM

## 2019-05-22 DIAGNOSIS — M25532 Pain in left wrist: Secondary | ICD-10-CM

## 2019-05-22 MED ORDER — MONTELUKAST SODIUM 10 MG PO TABS: 10.0000 mg | ORAL_TABLET | Freq: Every day | ORAL | 3 refills | Status: DC

## 2019-05-22 MED ORDER — TRAMADOL HCL 50 MG PO TABS: 50.0000 mg | ORAL_TABLET | Freq: Four times a day (QID) | ORAL | 0 refills | Status: AC | PRN

## 2019-05-22 NOTE — Progress Notes (Signed)
Subjective:  Patient ID: Shelly Bennett, female    DOB: [birth year], 68 y.o.   MRN: [id number]  Chief Complaint:  Wrist Pain and Fall   HPI: Shelly Bennett is a 68 y.o. female presenting on 05/22/2019 for Wrist Pain and Fall  Pt presents today for left wrist pain after a [place] injury. Pt states she was with her grandchild 2 weeks ago and tripped and fell landing on her left hand/wrist. States she has had continued pain since this time. States she has been taking tylenol with some relief of the pain. She reports pain in her wrist and hand. She states it hurts to grip or pick up objects. No swelling or bruising. No numbness or tingling.  She states she continues to have a dry due to there allergies. She denies sore throat, reflux, voice changes, hemoptysis, abdominal pain. States this has been ongoing for several weeks. Reports postnasal drip with the cough.   Relevant past medical, surgical, family, and social history reviewed and updated as indicated.  Allergies and medications reviewed and updated.   Past Medical History:  Diagnosis Date  . Adenomatous colon polyp   . Allergy   . Anxiety   . Arthritis   . CAD (coronary artery disease)    Dr. Burt Knack follows   . Chronic headaches   . Cystocele   . Diverticulosis   . External hemorrhoids   . HLD (hyperlipidemia)   . HTN (hypertension)   . IBS (irritable bowel syndrome)   . Memory loss   . Obesity   . Pneumonia   . Skin cancer    squamous cell - face    Past Surgical History:  Procedure Laterality Date  . CARPAL TUNNEL RELEASE    . COLONOSCOPY  12/15/2008   diverticulosis, external hemorrhoids  . KNEE SURGERY  05/22/2015   right  . LAPAROSCOPY    . SKIN SURGERY     squamous cell - right face     Social History   Socioeconomic History  . Marital status: Married    Spouse name: [name]  . Number of children: 4  . Years of education: 16  . Highest education level: Bachelor's degree (e.g., BA, AB, BS)   Occupational History  . 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 Financial resource strain: Not hard at all  . Food insecurity:    Worry: Never true    Inability: Never true  . Transportation needs:    Medical: No    Non-medical: No  Tobacco Use  . Smoking status: Never Smoker  . Smokeless tobacco: Never Used  Substance and Sexual Activity  . Alcohol use: No  . Drug use: No  . Sexual activity: Not Currently    Birth control/protection: None  Lifestyle  . Physical activity:    Days per week: 0 days    Minutes per session: Not on file  . Stress: Only a little  Relationships  . Social connections:    Talks on phone: More than three times a week    Gets together: More than three times a week    Attends religious service: More than 4 times per year    Active member of club or organization: Yes    Attends meetings of clubs or organizations: More than 4 times per year    Relationship status: Married  . Intimate partner violence:    Fear of current or ex partner: No    Emotionally abused: No  Physically abused: No    Forced sexual activity: No  Other Topics Concern  . Not on file  Social History Narrative   Lives at home with her husband.   Right-handed.   One cup coffee each morning and one soda each day.    Outpatient Encounter Medications as of 05/22/2019  Medication Sig  . amLODipine (NORVASC) 10 MG tablet TAKE (1/2) TABLET DAILY.  Marland Kitchen aspirin 81 MG tablet Take 81 mg by mouth daily.   . calcium carbonate 200 MG capsule Take 250 mg by mouth 2 (two) times daily.   . Cholecalciferol (VITAMIN D-3) 5000 UNITS TABS Take 1 capsule by mouth as directed. Take one capsule by mouth daily Mon thru Friday  . fexofenadine (ALLEGRA) 180 MG tablet Take 180 mg by mouth daily.  . fluticasone (FLONASE) 50 MCG/ACT nasal spray Place 2 sprays into both nostrils daily.  . hydrochlorothiazide (HYDRODIURIL) 25 MG tablet TAKE (1/2) TABLET DAILY.  Marland Kitchen omega-3 acid ethyl esters (LOVAZA) 1 g  capsule TAKE (1) CAPSULE FOUR TIMES DAILY.  . rosuvastatin (CRESTOR) 20 MG tablet Take 1 tablet (20 mg total) by mouth daily.  . montelukast (SINGULAIR) 10 MG tablet Take 1 tablet (10 mg total) by mouth at bedtime.  Marland Kitchen PARoxetine (PAXIL) 20 MG tablet Take 1.5 tablets (30 mg total) by mouth at bedtime.  . traMADol (ULTRAM) 50 MG tablet Take 1 tablet (50 mg total) by mouth every 6 (six) hours as needed for up to 3 days.  . [DISCONTINUED] albuterol (PROVENTIL HFA;VENTOLIN HFA) 108 (90 Base) MCG/ACT inhaler Inhale 2 puffs into the lungs every 6 (six) hours as needed for wheezing or shortness of breath. (Patient not taking: Reported on 02/05/2019)  . [DISCONTINUED] busPIRone (BUSPAR) 15 MG tablet Take 1 tablet (15 mg total) by mouth 2 (two) times daily as needed. (Patient not taking: Reported on 02/05/2019)  . [DISCONTINUED] cefdinir (OMNICEF) 300 MG capsule Take 1 capsule (300 mg total) by mouth 2 (two) times daily. 1 po BID  . [DISCONTINUED] predniSONE (DELTASONE) 10 MG tablet Take 1 tab QID x 2 days, then 1 tab TID x 2 days, then 1 tab BID x 2 days, then 1 tab QD x 2 days.  . [DISCONTINUED] trimethoprim-polymyxin b (POLYTRIM) ophthalmic solution Place 2 drops into both eyes every 4 (four) hours.   No facility-administered encounter medications on file as of 05/22/2019.     Allergies  Allergen Reactions  . Codeine     ? Reaction ER visit.   . Mold Extract [Trichophyton]     Migraine  . Penicillins Other (See Comments)    "drew my legs up as child" Pt has tolerated cephalexin in the past.  . Ace Inhibitors Cough  . Angiotensin Receptor Blockers Cough  . Levaquin [Levofloxacin] Rash    Per notes from outpatient provider  . Vytorin [Ezetimibe-Simvastatin] Other (See Comments)    cramping  . Zetia [Ezetimibe] Other (See Comments)    cramping    Review of Systems  Constitutional: Negative for activity change, appetite change, chills, fatigue, fever and unexpected weight change.  HENT: Positive  for congestion, postnasal drip, rhinorrhea and sneezing. Negative for sinus pressure, sinus pain, sore throat, tinnitus, trouble swallowing and voice change.   Respiratory: Positive for cough. Negative for chest tightness, shortness of breath and wheezing.   Cardiovascular: Negative for chest pain, palpitations and leg swelling.  Gastrointestinal: Negative for abdominal pain and nausea.  Musculoskeletal: Positive for arthralgias (left wrist).  Skin: Negative for color change, pallor, rash  and wound.  Neurological: Negative for dizziness, weakness, numbness and headaches.  Psychiatric/Behavioral: Negative for confusion.  All other systems reviewed and are negative.       Objective:  BP 128/79   Pulse 84   Temp 97.6 F (36.4 C)   Wt 206 lb (93.4 kg)   BMI 30.42 kg/m    Wt Readings from Last 3 Encounters:  05/22/19 206 lb (93.4 kg)  02/05/19 205 lb (93 kg)  12/18/18 195 lb (88.5 kg)    Physical Exam Vitals signs and nursing note reviewed.  Constitutional:      General: She is not in acute distress.    Appearance: Normal appearance. She is well-developed and well-groomed. She is not ill-appearing, toxic-appearing or diaphoretic.  HENT:     Head: Normocephalic and atraumatic.     Jaw: There is normal jaw occlusion.     Right Ear: Hearing normal.     Left Ear: Hearing normal.     Nose: Nose normal.     Mouth/Throat:     Lips: Pink.     Mouth: Mucous membranes are moist.     Pharynx: Oropharynx is clear. Uvula midline.  Eyes:     General: Lids are normal.     Extraocular Movements: Extraocular movements intact.     Conjunctiva/sclera: Conjunctivae normal.     Pupils: Pupils are equal, round, and reactive to light.  Neck:     Musculoskeletal: Normal range of motion and neck supple.     Thyroid: No thyroid mass, thyromegaly or thyroid tenderness.     Vascular: No carotid bruit or JVD.     Trachea: Trachea and phonation normal.  Cardiovascular:     Rate and Rhythm: Normal  rate and regular rhythm.     Chest Wall: PMI is not displaced.     Pulses: Normal pulses.     Heart sounds: Normal heart sounds. No murmur. No friction rub. No gallop.   Pulmonary:     Effort: Pulmonary effort is normal. No respiratory distress.     Breath sounds: Normal breath sounds. No wheezing.  Abdominal:     General: Bowel sounds are normal. There is no distension or abdominal bruit.     Palpations: Abdomen is soft. There is no hepatomegaly or splenomegaly.     Tenderness: There is no abdominal tenderness. There is no right CVA tenderness or left CVA tenderness.     Hernia: No hernia is present.  Musculoskeletal: Normal range of motion.     Left elbow: Normal.     Left wrist: She exhibits tenderness. She exhibits normal range of motion, no bony tenderness, no swelling, no effusion, no crepitus, no deformity and no laceration.     Left hand: She exhibits tenderness and swelling. She exhibits normal range of motion, no bony tenderness, normal two-point discrimination, normal capillary refill, no deformity and no laceration. Normal sensation noted. Normal strength noted.     Right lower leg: No edema.     Left lower leg: No edema.  Lymphadenopathy:     Cervical: No cervical adenopathy.  Skin:    General: Skin is warm and dry.     Capillary Refill: Capillary refill takes less than 2 seconds.     Coloration: Skin is not cyanotic, jaundiced or pale.     Findings: No rash.  Neurological:     General: No focal deficit present.     Mental Status: She is alert and oriented to person, place, and time.  Cranial Nerves: Cranial nerves are intact.     Sensory: Sensation is intact.     Motor: Motor function is intact.     Coordination: Coordination is intact.     Gait: Gait is intact.     Deep Tendon Reflexes: Reflexes are normal and symmetric.  Psychiatric:        Attention and Perception: Attention and perception normal.        Mood and Affect: Mood and affect normal.        Speech:  Speech normal.        Behavior: Behavior normal. Behavior is cooperative.        Thought Content: Thought content normal.        Cognition and Memory: Cognition and memory normal.        Judgment: Judgment normal.     Results for orders placed or performed in visit on 02/05/19  Microscopic Examination  Result Value Ref Range   WBC, UA 0-5 0 - 5 /hpf   RBC, UA 0-2 0 - 2 /hpf   Epithelial Cells (non renal) 0-10 0 - 10 /hpf   Renal Epithel, UA None seen None seen /hpf   Crystals Present N/A   Crystal Type Amorphous Sediment N/A   Bacteria, UA None seen None seen/Few  CBC with Differential/Platelet  Result Value Ref Range   WBC 6.1 3.4 - 10.8 x10E3/uL   RBC 4.11 3.77 - 5.28 x10E6/uL   Hemoglobin 12.6 11.1 - 15.9 g/dL   Hematocrit 37.2 34.0 - 46.6 %   MCV 91 79 - 97 fL   MCH 30.7 26.6 - 33.0 pg   MCHC 33.9 31.5 - 35.7 g/dL   RDW 12.7 11.7 - 15.4 %   Platelets 335 150 - 450 x10E3/uL   Neutrophils 66 Not Estab. %   Lymphs 23 Not Estab. %   Monocytes 6 Not Estab. %   Eos 3 Not Estab. %   Basos 1 Not Estab. %   Neutrophils Absolute 4.1 1.4 - 7.0 x10E3/uL   Lymphocytes Absolute 1.4 0.7 - 3.1 x10E3/uL   Monocytes Absolute 0.4 0.1 - 0.9 x10E3/uL   EOS (ABSOLUTE) 0.2 0.0 - 0.4 x10E3/uL   Basophils Absolute 0.1 0.0 - 0.2 x10E3/uL   Immature Granulocytes 1 Not Estab. %   Immature Grans (Abs) 0.1 0.0 - 0.1 x10E3/uL  BMP8+EGFR  Result Value Ref Range   Glucose 100 (H) 65 - 99 mg/dL   BUN 10 8 - 27 mg/dL   Creatinine, Ser 0.72 0.57 - 1.00 mg/dL   GFR calc non Af Amer 86 >59 mL/min/1.73   GFR calc Af Amer 100 >59 mL/min/1.73   BUN/Creatinine Ratio 14 12 - 28   Sodium 144 134 - 144 mmol/L   Potassium 4.5 3.5 - 5.2 mmol/L   Chloride 106 96 - 106 mmol/L   CO2 24 20 - 29 mmol/L   Calcium 9.9 8.7 - 10.3 mg/dL  Lipid panel  Result Value Ref Range   Cholesterol, Total 174 100 - 199 mg/dL   Triglycerides 106 0 - 149 mg/dL   HDL 56 >39 mg/dL   VLDL Cholesterol Cal 21 5 - 40 mg/dL    LDL Calculated 97 0 - 99 mg/dL   Chol/HDL Ratio 3.1 0.0 - 4.4 ratio  VITAMIN D 25 Hydroxy (Vit-D Deficiency, Fractures)  Result Value Ref Range   Vit D, 25-Hydroxy 50.6 30.0 - 100.0 ng/mL  Hepatic function panel  Result Value Ref Range   Total Protein 6.5 6.0 -  8.5 g/dL   Albumin 4.2 3.8 - 4.8 g/dL   Bilirubin Total 0.3 0.0 - 1.2 mg/dL   Bilirubin, Direct 0.11 0.00 - 0.40 mg/dL   Alkaline Phosphatase 83 39 - 117 IU/L   AST 26 0 - 40 IU/L   ALT 18 0 - 32 IU/L  Vitamin B12  Result Value Ref Range   Vitamin B-12 557 232 - 1,245 pg/mL  Thyroid Panel With TSH  Result Value Ref Range   TSH 2.040 0.450 - 4.500 uIU/mL   T4, Total 7.0 4.5 - 12.0 ug/dL   T3 Uptake Ratio 23 (L) 24 - 39 %   Free Thyroxine Index 1.6 1.2 - 4.9  Urinalysis, Complete  Result Value Ref Range   Specific Gravity, UA 1.020 1.005 - 1.030   pH, UA 8.5 (H) 5.0 - 7.5   Color, UA Yellow Yellow   Appearance Ur Clear Clear   Leukocytes, UA Trace (A) Negative   Protein, UA Negative Negative/Trace   Glucose, UA Negative Negative   Ketones, UA Negative Negative   RBC, UA Trace (A) Negative   Bilirubin, UA Negative Negative   Urobilinogen, Ur 0.2 0.2 - 1.0 mg/dL   Nitrite, UA Negative Negative   Microscopic Examination See below:      X-Ray: left wrist: No acute findings. Preliminary x-ray reading by Monia Pouch, FNP-C, [facility].   Pertinent labs & imaging results that were available during my care of the patient were reviewed by me and considered in my medical decision making.  Assessment & Plan:  Keiana was seen today for wrist pain and fall.  Diagnoses and all orders for this visit:  Fall, initial encounter -     DG Wrist Complete Left; Future  Left wrist pain Xray without acute bony abnormalities. RICE. Brace applied in office. Ultram as needed for severe pain, tylenol for moderate pain. Follow up in 4 weeks for reevaluation.  -     DG Wrist Complete Left; Future -     traMADol (ULTRAM) 50 MG tablet;  Take 1 tablet (50 mg total) by mouth every 6 (six) hours as needed for up to 3 days.  Seasonal allergic rhinitis due to pollen Continued dry cough despite Flonase and Allegra daily. Will trial Singulair. No GERD symptoms. Report any new or worsening symptoms.  -     montelukast (SINGULAIR) 10 MG tablet; Take 1 tablet (10 mg total) by mouth at bedtime.     Continue all other maintenance medications.  Follow up plan: Return in about 4 weeks (around 06/19/2019), or if symptoms worsen or fail to improve, for wrist pain.  Educational handout given for wrist pain  The above assessment and management plan was discussed with the patient. The patient verbalized understanding of and has agreed to the management plan. Patient is aware to call the clinic if symptoms persist or worsen. Patient is aware when to return to the clinic for a follow-up visit. Patient educated on when it is appropriate to go to the emergency department.   Monia Pouch, FNP-C [facility] [phone]

## 2019-05-22 NOTE — Patient Instructions (Signed)
Wrist Pain, Adult  There are many things that can cause wrist pain. Some common causes include:   An injury to the wrist area.   Overuse of the joint.   A condition that causes too much pressure to be put on a nerve in the wrist (carpal tunnel syndrome).   Wear and tear of the joints that happens as a person gets older (osteoarthritis).   Other types of arthritis.  Sometimes, the cause of wrist pain is not known. Often, the pain goes away when you follow your doctor's instructions for helping pain at home, such as resting or icing your wrist. If your wrist pain does not go away, it is important to tell your doctor.  Follow these instructions at home:   Rest the wrist area for 48 hours or more, or as long as told by your doctor.   If a splint or elastic bandage has been put on your wrist, use it as told by your doctor.  ? Take off the splint or bandage only as told by your doctor.  ? Loosen the splint or bandage if your fingers tingle, lose feeling (get numb), or turn cold or blue.   If directed, apply ice to the injured area:  ? If you have a removable splint or elastic bandage, remove it as told by your doctor.  ? Put ice in a plastic bag.  ? Place a towel between your skin and the bag or between your splint or bandage and the bag.  ? Leave the ice on for 20 minutes, 2-3 times a day.     Keep your arm raised (elevated) above the level of your heart while you are sitting or lying down.   Take over-the-counter and prescription medicines only as told by your doctor.   Keep all follow-up visits as told by your doctor. This is important.  Contact a doctor if:   You have a sudden sharp pain in the wrist, hand, or arm that is different or new.   The swelling or bruising on your wrist or hand gets worse.   Your skin becomes red, gets a rash, or has open sores.   Your pain does not get better or it gets worse.  Get help right away if:   You lose feeling in your fingers or hand.   Your fingers turn white,  very red, or cold and blue.   You cannot move your fingers.   You have a fever or chills.  This information is not intended to replace advice given to you by your health care provider. Make sure you discuss any questions you have with your health care provider.  Document Released: 05/21/2008 Document Revised: 06/28/2016 Document Reviewed: 06/21/2016  Elsevier Interactive Patient Education  2019 [employer]

## 2019-05-27 ENCOUNTER — Ambulatory Visit: Payer: Medicare Other | Admitting: Orthopaedic Surgery

## 2019-05-27 ENCOUNTER — Other Ambulatory Visit: Payer: Self-pay

## 2019-05-27 ENCOUNTER — Encounter: Payer: Self-pay | Admitting: Orthopaedic Surgery

## 2019-05-27 VITALS — BP 157/89 | HR 60 | Ht 68.0 in | Wt 200.0 lb

## 2019-05-27 DIAGNOSIS — M25532 Pain in left wrist: Secondary | ICD-10-CM | POA: Insufficient documentation

## 2019-05-27 NOTE — Progress Notes (Signed)
Office Visit Note   Patient: Shelly Bennett           Date of Birth: 1951-06-02           MRN: [id number] Visit Date: 05/27/2019              Requested by: Baruch Gouty, [place] [place], [place] [address] PCP: Chipper Herb, MD   Assessment & Plan: Visit Diagnoses:  1. Pain in left wrist     Plan: Left wrist pain after a fall just over 2 weeks ago.  No evidence of fracture by films.  Does have diffuse degenerative changes.  I suspect she has had an exacerbation of arthritis possibly sprain pattern.  I would suggest Voltaren gel.  Continue with the wrist splint.  Office 3 to 4 weeks if no improvement.  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  Have discussed that as well.  Consider local cortisone injections in future. I am also concerned that she has had a number of falls lately.  She has been evaluated by 1 of the local neurologists in the past but has not been seen in the last year.  I have suggested that she call them to make another follow-up appointment  Follow-Up Instructions: Return if symptoms worsen or fail to improve.   Orders:  No orders of the defined types were placed in this encounter.  No orders of the defined types were placed in this encounter.     Procedures: No procedures performed   Clinical Data: No additional findings.   Subjective: Chief Complaint  Patient presents with   Left Wrist - Pain  Patient presents today with left wrist pain. She fell two weeks ago on an outstretched arm to avoid falling on her granddaughter. She saw her PCP and had x-rays on 05/22/2019. She is wearing a wrist brace, which helped a little for her pain. She has swelling. She has noticed that her middle finger is curled in the morning and hard to straighten, since the injury. She is taking tramadol or tylenol for pain as needed.  I reviewed films performed on June 5 from her primary  care physician's office.  There is no evidence of fracture.  There are degenerative changes at the radiocarpal joint and at the base of the thumb.  There may be slight widening of the space between the navicular and the lunate  HPI  Review of Systems   Objective: Vital Signs: BP (!) 157/89    Pulse 60    Ht 5\' 8"  (1.727 m)    Wt 200 lb (90.7 kg)    BMI 30.41 kg/m   Physical Exam Constitutional:      Appearance: She is well-developed.  Eyes:     Pupils: Pupils are equal, round, and reactive to light.  Pulmonary:     Effort: Pulmonary effort is normal.  Skin:    General: Skin is warm and dry.  Neurological:     Mental Status: She is alert and oriented to person, place, and time.  Psychiatric:        Behavior: Behavior normal.     Ortho Exam awake alert and oriented x3.  Comfortable sitting.  Examination left wrist there was no ecchymosis or swelling.  There was some tenderness across the dorsum of the wrist and at the base of the thumb.  Does have some tenderness over the A1 pulley of the  long finger but no active triggering.  Neurologically intact.  Full pronation supination flexion extension of the wrist.  Able to make a full fist  Specialty Comments:  No specialty comments available.  Imaging: No results found.   PMFS History: Patient Active Problem List   Diagnosis Date Noted   Pain in left wrist 05/27/2019   Trigger thumb, right thumb 10/29/2018   Memory loss 07/22/2018   Low back pain 07/22/2018   Left lumbar radiculopathy 01/22/2018   Mild cognitive impairment 10/22/2017   Anxiety 10/22/2017   Otitis of left ear 02/01/2017   Aortic atherosclerosis ([place]) 12/04/2016   Allergic drug reaction 06/25/2015   Dehydration with hyponatremia 06/25/2015   HCAP (healthcare-associated pneumonia) 06/25/2015   Vitamin D deficiency 08/19/2014   Family history of colon cancer-father at 58+ grandparent w/ rectal cancer 01/14/2014   Osteopenia 08/12/2013   CAD  (coronary artery disease), native coronary artery 03/22/2013   Aortic valve stenosis, mild 03/22/2013   Hyperlipemia 03/11/2013   Hypertension 03/11/2013   Allergic rhinitis 03/11/2013   Left shoulder pain 03/11/2013   IBS (irritable bowel syndrome) - with chronic recurrent abdominal pain 02/01/2012   Personal history of colonic polyps - adenoma 02/01/2012   Coccydynia 02/01/2012   Past Medical History:  Diagnosis Date   Adenomatous colon polyp    Allergy    Anxiety    Arthritis    CAD (coronary artery disease)    Dr. Burt Knack follows    Chronic headaches    Cystocele    Diverticulosis    External hemorrhoids    HLD (hyperlipidemia)    HTN (hypertension)    IBS (irritable bowel syndrome)    Memory loss    Obesity    Pneumonia    Skin cancer    squamous cell - face    Family History  Problem Relation Age of Onset   Prostate cancer Father    Colon cancer Father 13   Kidney disease Father    Heart disease Father    Alzheimer's disease Father    Hyperlipidemia Mother    Hypertension Mother    Kidney disease Mother    Osteoporosis Mother    Heart murmur Mother    Dementia Mother    Hyperlipidemia Sister    Hypertension Sister    Heart disease Brother 45       not dx questionable    Heart disease Maternal Grandfather     Past Surgical History:  Procedure Laterality Date   CARPAL TUNNEL RELEASE     COLONOSCOPY  12/15/2008   diverticulosis, external hemorrhoids   KNEE SURGERY  05/22/2015   right   LAPAROSCOPY     SKIN SURGERY     squamous cell - right face    Social History   Occupational History   Occupation: Pharmacist, hospital retired  Tobacco Use   Smoking status: Never Smoker   Smokeless tobacco: Never Used  Substance and Sexual Activity   Alcohol use: No   Drug use: No   Sexual activity: Not Currently    Birth control/protection: None

## 2019-05-28 ENCOUNTER — Telehealth: Payer: Self-pay | Admitting: Orthopaedic Surgery

## 2019-05-28 NOTE — Telephone Encounter (Signed)
Voltaren gel-could try hemp oil

## 2019-05-28 NOTE — Telephone Encounter (Signed)
Patient calling to see if you could let her know what oil doctor was recommending for her to use on arm. Patient wants to get some today. Please call to advise.

## 2019-05-28 NOTE — Telephone Encounter (Signed)
Please advise 

## 2019-05-28 NOTE — Telephone Encounter (Signed)
Called and spoke with patient. Relayed information below. 

## 2019-06-05 ENCOUNTER — Encounter: Payer: Self-pay | Admitting: *Deleted

## 2019-06-16 ENCOUNTER — Ambulatory Visit (INDEPENDENT_AMBULATORY_CARE_PROVIDER_SITE_OTHER): Payer: Medicare Other | Admitting: *Deleted

## 2019-06-16 ENCOUNTER — Other Ambulatory Visit: Payer: Self-pay

## 2019-06-16 VITALS — Ht 68.0 in | Wt 200.0 lb

## 2019-06-16 DIAGNOSIS — Z0000 Encounter for general adult medical examination without abnormal findings: Secondary | ICD-10-CM | POA: Diagnosis not present

## 2019-06-16 NOTE — Patient Instructions (Signed)
Shelly Bennett , Thank you for taking time to come for your Medicare Wellness Visit. I appreciate your ongoing commitment to your health goals. Please review the following plan we discussed and let me know if I can assist you in the future.   These are the goals we discussed: Goals     Exercise 3x per week (30 min per time)       This is a list of the screening recommended for you and due dates:  Health Maintenance  Topic Date Due   Colon Cancer Screening  01/14/2019    Hepatitis C: One time screening is recommended by Center for Disease Control  (CDC) for  adults born from [birth year] through 1966.   05/02/2023*   Flu Shot  07/18/2019   Stool Blood Test  11/11/2019   Mammogram  12/24/2020   Tetanus Vaccine  05/02/2027   DEXA scan (bone density measurement)  Completed   Pneumonia vaccines  Completed  *Topic was postponed. The date shown is not the original due date.       Fall Prevention in the Home, Adult Falls can cause injuries. They can happen to people of all ages. There are many things you can do to make your home safe and to help prevent falls. Ask for help when making these changes, if needed. What actions can I take to prevent falls? General Instructions  Use good lighting in all rooms. Replace any light bulbs that burn out.  Turn on the lights when you go into a dark area. Use night-lights.  Keep items that you use often in easy-to-reach places. Lower the shelves around your home if necessary.  Set up your furniture so you have a clear path. Avoid moving your furniture around.  Do not have throw rugs and other things on the floor that can make you trip.  Avoid walking on wet floors.  If any of your floors are uneven, fix them.  Add color or contrast paint or tape to clearly mark and help you see: ? Any grab bars or handrails. ? First and last steps of stairways. ? Where the edge of each step is.  If you use a stepladder: ? Make sure that it is  fully opened. Do not climb a closed stepladder. ? Make sure that both sides of the stepladder are locked into place. ? Ask someone to hold the stepladder for you while you use it.  If there are any pets around you, be aware of where they are. What can I do in the bathroom?      Keep the floor dry. Clean up any water that spills onto the floor as soon as it happens.  Remove soap buildup in the tub or shower regularly.  Use non-skid mats or decals on the floor of the tub or shower.  Attach bath mats securely with double-sided, non-slip rug tape.  If you need to sit down in the shower, use a plastic, non-slip stool.  Install grab bars by the toilet and in the tub and shower. Do not use towel bars as grab bars. What can I do in the bedroom?  Make sure that you have a light by your bed that is easy to reach.  Do not use any sheets or blankets that are too big for your bed. They should not hang down onto the floor.  Have a firm chair that has side arms. You can use this for support while you get dressed.  What can I do in the kitchen?  Clean up any spills right away.  If you need to reach something above you, use a strong step stool that has a grab bar.  Keep electrical cords out of the way.  Do not use floor polish or wax that makes floors slippery. If you must use wax, use non-skid floor wax. What can I do with my stairs?  Do not leave any items on the stairs.  Make sure that you have a light switch at the top of the stairs and the bottom of the stairs. If you do not have them, ask someone to add them for you.  Make sure that there are handrails on both sides of the stairs, and use them. Fix handrails that are broken or loose. Make sure that handrails are as long as the stairways.  Install non-slip stair treads on all stairs in your home.  Avoid having throw rugs at the top or bottom of the stairs. If you do have throw rugs, attach them to the floor with carpet  tape.  Choose a carpet that does not hide the edge of the steps on the stairway.  Check any carpeting to make sure that it is firmly attached to the stairs. Fix any carpet that is loose or worn. What can I do on the outside of my home?  Use bright outdoor lighting.  Regularly fix the edges of walkways and driveways and fix any cracks.  Remove anything that might make you trip as you walk through a door, such as a raised step or threshold.  Trim any bushes or trees on the path to your home.  Regularly check to see if handrails are loose or broken. Make sure that both sides of any steps have handrails.  Install guardrails along the edges of any raised decks and porches.  Clear walking paths of anything that might make someone trip, such as tools or rocks.  Have any leaves, snow, or ice cleared regularly.  Use sand or salt on walking paths during winter.  Clean up any spills in your garage right away. This includes grease or oil spills. What other actions can I take?  Wear shoes that: ? Have a low heel. Do not wear high heels. ? Have rubber bottoms. ? Are comfortable and fit you well. ? Are closed at the toe. Do not wear open-toe sandals.  Use tools that help you move around (mobility aids) if they are needed. These include: ? Canes. ? Walkers. ? Scooters. ? Crutches.  Review your medicines with your doctor. Some medicines can make you feel dizzy. This can increase your chance of falling. Ask your doctor what other things you can do to help prevent falls. Where to find more information  Centers for Disease Control and Prevention, [facility]AD[facility]: https://garcia.biz/  [employer] on Aging: BrainJudge.co.uk Contact a doctor if:  You are afraid of falling at home.  You feel weak, drowsy, or dizzy at home.  You fall at home. Summary  There are many simple things that you can do to make your home safe and to help prevent falls.  Ways to make your home safe  include removing tripping hazards and installing grab bars in the bathroom.  Ask for help when making these changes in your home. This information is not intended to replace advice given to you by your health care provider. Make sure you discuss any questions you have with your health care provider. Document Released: 09/29/2009 Document Revised: 03/26/2019  Document Reviewed: 07/18/2017 Elsevier Patient Education  [place] Directive  Advance directives are legal documents that let you make choices ahead of time about your health care and medical treatment in case you become unable to communicate for yourself. Advance directives are a way for you to communicate your wishes to family, friends, and health care providers. This can help convey your decisions about end-of-life care if you become unable to communicate. Discussing and writing advance directives should happen over time rather than all at once. Advance directives can be changed depending on your situation and what you want, even after you have signed the advance directives. If you do not have an advance directive, some states assign family decision makers to act on your behalf based on how closely you are related to them. Each state has its own laws regarding advance directives. You may want to check with your health care provider, attorney, or state representative about the laws in your state. There are different types of advance directives, such as:  Medical power of attorney.  Living will.  Do not resuscitate (DNR) or do not attempt resuscitation (DNAR) order. Health care proxy and medical power of attorney A health care proxy, also called a health care agent, is a person who is appointed to make medical decisions for you in cases in which you are unable to make the decisions yourself. Generally, people choose someone they know well and trust to represent their preferences. Make sure to ask this person for an  agreement to act as your proxy. A proxy may have to exercise judgment in the event of a medical decision for which your wishes are not known. A medical power of attorney is a legal document that names your health care proxy. Depending on the laws in your state, after the document is written, it may also need to be:  Signed.  Notarized.  Dated.  Copied.  Witnessed.  Incorporated into your medical record. You may also want to appoint someone to manage your financial affairs in a situation in which you are unable to do so. This is called a durable power of attorney for finances. It is a separate legal document from the durable power of attorney for health care. You may choose the same person or someone different from your health care proxy to act as your agent in financial matters. If you do not appoint a proxy, or if there is a concern that the proxy is not acting in your best interests, a court-appointed guardian may be designated to act on your behalf. Living will A living will is a set of instructions documenting your wishes about medical care when you cannot express them yourself. Health care providers should keep a copy of your living will in your medical record. You may want to give a copy to family members or friends. To alert caregivers in case of an emergency, you can place a card in your wallet to let them know that you have a living will and where they can find it. A living will is used if you become:  Terminally ill.  Incapacitated.  Unable to communicate or make decisions. Items to consider in your living will include:  The use or non-use of life-sustaining equipment, such as dialysis machines and breathing machines (ventilators).  A DNR or DNAR order, which is the instruction not to use cardiopulmonary resuscitation (CPR) if breathing or heartbeat stops.  The use or non-use of tube feeding.  Withholding of  food and fluids.  Comfort (palliative) care when the goal becomes  comfort rather than a cure.  Organ and tissue donation. A living will does not give instructions for distributing your money and property if you should pass away. It is recommended that you seek the advice of a lawyer when writing a will. Decisions about taxes, beneficiaries, and asset distribution will be legally binding. This process can relieve your family and friends of any concerns surrounding disputes or questions that may come up about the distribution of your assets. DNR or DNAR A DNR or DNAR order is a request not to have CPR in the event that your heart stops beating or you stop breathing. If a DNR or DNAR order has not been made and shared, a health care provider will try to help any patient whose heart has stopped or who has stopped breathing. If you plan to have surgery, talk with your health care provider about how your DNR or DNAR order will be followed if problems occur. Summary  Advance directives are the legal documents that allow you to make choices ahead of time about your health care and medical treatment in case you become unable to communicate for yourself.  The process of discussing and writing advance directives should happen over time. You can change the advance directives, even after you have signed them.  Advance directives include DNR or DNAR orders, living wills, and designating an agent as your medical power of attorney. This information is not intended to replace advice given to you by your health care provider. Make sure you discuss any questions you have with your health care provider. Document Released: 03/11/2008 Document Revised: 01/07/2019 Document Reviewed: 10/22/2016 Elsevier Patient Education  [place].     BMI for Adults  Body mass index (BMI) is a number that is calculated from a person's weight and height. BMI may help to estimate how much of a person's weight is composed of fat. BMI can help identify those who may be at higher risk for  certain medical problems. How is BMI used with adults? BMI is used as a screening tool to identify possible weight problems. It is used to check whether a person is obese, overweight, healthy weight, or underweight. How is BMI calculated? BMI measures your weight and compares it to your height. This can be done either in [place] ([place] or metric measurements. Note that charts are available to help you find your BMI quickly and easily without having to do these calculations yourself. To calculate your BMI in [place] ([place] measurements, your health care provider will: 1. Measure your weight in pounds (lb). 2. Multiply the number of pounds by 703. ? For example, for a person who weighs 180 lb, multiply that number by 703, which equals 126,540. 3. Measure your height in inches (in). Then multiply that number by itself to get a measurement called "inches squared." ? For example, for a person who is 70 in tall, the "inches squared" measurement is 70 in x 70 in, which equals 4900 inches squared. 4. Divide the total from Step 2 (number of lb x 703) by the total from Step 3 (inches squared): 126,540  4900 = 25.8. This is your BMI. To calculate your BMI in metric measurements, your health care provider will: 1. Measure your weight in kilograms (kg). 2. Measure your height in meters (m). Then multiply that number by itself to get a measurement called "meters squared." ? For example, for a person who is 1.75  m tall, the "meters squared" measurement is 1.75 m x 1.75 m, which is equal to 3.1 meters squared. 3. Divide the number of kilograms (your weight) by the meters squared number. In this example: 70  3.1 = 22.6. This is your BMI. How is BMI interpreted? To interpret your results, your health care provider will use BMI charts to identify whether you are underweight, normal weight, overweight, or obese. The following guidelines will be used:  Underweight: BMI less than 18.5.  Normal weight: BMI between  18.5 and 24.9.  Overweight: BMI between 25 and 29.9.  Obese: BMI of 30 and above. Please note:  Weight includes both fat and muscle, so someone with a muscular build, such as an athlete, may have a BMI that is higher than 24.9. In cases like these, BMI is not an accurate measure of body fat.  To determine if excess body fat is the cause of a BMI of 25 or higher, further assessments may need to be done by a health care provider.  BMI is usually interpreted in the same way for men and women. Why is BMI a useful tool? BMI is useful in two ways:  Identifying a weight problem that may be related to a medical condition, or that may increase the risk for medical problems.  Promoting lifestyle and diet changes in order to reach a healthy weight. Summary  Body mass index (BMI) is a number that is calculated from a person's weight and height.  BMI may help to estimate how much of a person's weight is composed of fat. BMI can help identify those who may be at higher risk for certain medical problems.  BMI can be measured using English measurements or metric measurements.  To interpret your results, your health care provider will use BMI charts to identify whether you are underweight, normal weight, overweight, or obese. This information is not intended to replace advice given to you by your health care provider. Make sure you discuss any questions you have with your health care provider. Document Released: 08/14/2004 Document Revised: 11/15/2017 Document Reviewed: 10/16/2017 Elsevier Patient Education  2020 [employer].

## 2019-06-16 NOTE — Progress Notes (Signed)
MEDICARE ANNUAL WELLNESS VISIT  06/16/2019  Telephone Visit Disclaimer This Medicare AWV was conducted by telephone due to national recommendations for restrictions regarding the COVID-19 Pandemic (e.g. social distancing).  I verified, using two identifiers, that I am speaking with Shelly Bennett or their authorized healthcare agent. I discussed the limitations, risks, security, and privacy concerns of performing an evaluation and management service by telephone and the potential availability of an in-person appointment in the future. The patient expressed understanding and agreed to proceed.   Subjective:  Shelly Bennett is a 68 y.o. female patient of Chipper Herb, MD who had a Medicare Annual Wellness Visit today via telephone. Shelly Bennett is Retired and lives with their spouse. she has 4 children. she reports that she is socially active and does interact with friends/family regularly. she is not physically active and enjoys gardening.  Patient Care Team: Chipper Herb, MD as PCP - General (Family Medicine) Hillary Bow, MD (Cardiology) Sherren Mocha, MD (Cardiology) Gatha Mayer, MD (Gastroenterology) Newton Pigg, MD (Obstetrics and Gynecology) Garald Balding, MD (Orthopedic Surgery) Marcial Pacas, MD as Consulting Physician (Neurology)  Advanced Directives 06/16/2019 01/30/2018 01/28/2018 01/02/2016 06/25/2015 06/22/2015 06/06/2015  Does Patient Have a Medical Advance Directive? No No No No No No No  Would patient like information on creating a medical advance directive? Yes (MAU/Ambulatory/Procedural Areas - Information given) Yes (MAU/Ambulatory/Procedural Areas - Information given) - No - patient declined information No - patient declined information - [facility] Utilization Over the Past 12 Months: # of hospitalizations or ER visits: 0 # of surgeries: 0  Review of Systems    Patient reports that her overall health is worse compared to last year.   Patient Reported Readings (BP, Pulse, CBG, Weight, etc) none  Review of Systems: History obtained from chart review and the patient General ROS: positive for  - fatigue Musculoskeletal ROS: positive for - pain in arm - left Neurological ROS: positive for - gait disturbance and memory loss  All other systems negative.  Pain Assessment Pain : 0-10 Pain Score: 7  Pain Type: Acute pain Pain Location: Arm Pain Orientation: Left Pain Radiating Towards: Elbow down to wrist Pain Descriptors / Indicators: Sharp, Shooting, Tender Pain Onset: More than a month ago Pain Frequency: Constant Pain Relieving Factors: Voltaren Gel, Tylenol, Tramadol  Pain Relieving Factors: Voltaren Gel, Tylenol, Tramadol  Current Medications & Allergies (verified) Allergies as of 06/16/2019      Reactions   Codeine    ? Reaction ER visit.    Mold Extract [trichophyton]    Migraine   Penicillins Other (See Comments)   "drew my legs up as child" Pt has tolerated cephalexin in the past.   Ace Inhibitors Cough   Angiotensin Receptor Blockers Cough   Levaquin [levofloxacin] Rash   Per notes from outpatient provider   Vytorin [ezetimibe-simvastatin] Other (See Comments)   cramping   Zetia [ezetimibe] Other (See Comments)   cramping      Medication List       Accurate as of June 16, 2019  1:50 PM. If you have any questions, ask your nurse or doctor.        amLODipine 10 MG tablet Commonly known as: NORVASC TAKE (1/2) TABLET DAILY.   aspirin 81 MG tablet Take 81 mg by mouth daily.   calcium carbonate 200 MG capsule Take 250 mg by mouth 2 (two) times daily.   fexofenadine 180 MG tablet Commonly known as:  ALLEGRA Take 180 mg by mouth daily.   fluticasone 50 MCG/ACT nasal spray Commonly known as: FLONASE Place 2 sprays into both nostrils daily.   hydrochlorothiazide 25 MG tablet Commonly known as: HYDRODIURIL TAKE (1/2) TABLET DAILY.   montelukast 10 MG tablet Commonly known as:  SINGULAIR Take 1 tablet (10 mg total) by mouth at bedtime.   omega-3 acid ethyl esters 1 g capsule Commonly known as: LOVAZA TAKE (1) CAPSULE FOUR TIMES DAILY.   PARoxetine 20 MG tablet Commonly known as: PAXIL Take 1.5 tablets (30 mg total) by mouth at bedtime.   rosuvastatin 20 MG tablet Commonly known as: CRESTOR Take 1 tablet (20 mg total) by mouth daily.   traMADol 50 MG tablet Commonly known as: ULTRAM Take by mouth every 6 (six) hours as needed.   Vitamin D-3 125 MCG (5000 UT) Tabs Take 1 capsule by mouth as directed. Take one capsule by mouth daily Mon thru Friday       History (reviewed): Past Medical History:  Diagnosis Date  . Adenomatous colon polyp   . Allergy   . Anxiety   . Arthritis   . CAD (coronary artery disease)    Dr. Burt Knack follows   . Chronic headaches   . Cystocele   . Diverticulosis   . External hemorrhoids   . HLD (hyperlipidemia)   . HTN (hypertension)   . IBS (irritable bowel syndrome)   . Memory loss   . Obesity   . Pneumonia   . Skin cancer    squamous cell - face   Past Surgical History:  Procedure Laterality Date  . CARPAL TUNNEL RELEASE    . COLONOSCOPY  12/15/2008   diverticulosis, external hemorrhoids  . KNEE SURGERY  05/22/2015   right  . LAPAROSCOPY    . SKIN SURGERY     squamous cell - right face    Family History  Problem Relation Age of Onset  . Prostate cancer Father   . Colon cancer Father 60  . Kidney disease Father   . Heart disease Father   . Alzheimer's disease Father   . Hyperlipidemia Mother   . Hypertension Mother   . Kidney disease Mother   . Osteoporosis Mother   . Heart murmur Mother   . Dementia Mother   . Hyperlipidemia Sister   . Hypertension Sister   . Heart disease Brother 57       not dx questionable   . Heart disease Maternal Grandfather    Social History   Socioeconomic History  . Marital status: Married    Spouse name: [name]  . Number of children: 4  . Years of education:  16  . Highest education level: Bachelor's degree (e.g., BA, AB, BS)  Occupational History  . 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 Financial resource strain: Not hard at all  . Food insecurity    Worry: Never true    Inability: Never true  . Transportation needs    Medical: No    Non-medical: No  Tobacco Use  . Smoking status: Never Smoker  . Smokeless tobacco: Never Used  Substance and Sexual Activity  . Alcohol use: No  . Drug use: No  . Sexual activity: Not Currently    Birth control/protection: None  Lifestyle  . Physical activity    Days per week: 0 days    Minutes per session: 0 min  . Stress: Only a little  Relationships  . Social Herbalist on phone:  More than three times a week    Gets together: More than three times a week    Attends religious service: More than 4 times per year    Active member of club or organization: Yes    Attends meetings of clubs or organizations: More than 4 times per year    Relationship status: Married  Other Topics Concern  . Not on file  Social History Narrative   Lives at home with her husband.   Right-handed.   One cup coffee each morning and one soda each day.    Activities of Daily Living In your present state of health, do you have any difficulty performing the following activities: 06/16/2019  Hearing? N  Vision? Y  Difficulty concentrating or making decisions? Y  Walking or climbing stairs? Y  Dressing or bathing? N  Preparing Food and eating ? N  Using the Toilet? N  In the past six months, have you accidently leaked urine? Y  Do you have problems with loss of bowel control? Y  Managing your Medications? N  Managing your Finances? N  Housekeeping or managing your Housekeeping? N  Some recent data might be hidden    Patient Literacy How often do you need to have someone help you when you read instructions, pamphlets, or other written materials from your doctor or pharmacy?: 1 - Never  Exercise  Current Exercise Habits: The patient does not participate in regular exercise at present  Diet Patient reports consuming 3 meals a day and 2 snack(s) a day Patient reports that her primary diet is: Regular Patient reports that she does have regular access to food.   Depression Screen PHQ 2/9 Scores 06/16/2019 06/16/2019 02/05/2019 11/21/2018 09/24/2018 05/19/2018 01/30/2018  PHQ - 2 Score 0 0 4 0 1 1 1   PHQ- 9 Score - - 4 - - - -     Fall Risk Fall Risk  06/16/2019 06/16/2019 02/05/2019 11/21/2018 09/24/2018  Falls in the past year? 1 0 1 1 No  Number falls in past yr: 1 0 0 0 -  Injury with Fall? 1 0 1 1 -  Comment - - - Back and shoulder pain and bruising. -  Risk for fall due to : History of fall(s) - - - -  Follow up Education provided - - - -     Objective:  Shelly Bennett seemed alert and oriented and she participated appropriately during our telephone visit.  Blood Pressure Weight BMI  BP Readings from Last 3 Encounters:  05/27/19 (!) 157/89  05/22/19 128/79  02/05/19 135/75   Wt Readings from Last 3 Encounters:  06/16/19 200 lb (90.7 kg)  05/27/19 200 lb (90.7 kg)  05/22/19 206 lb (93.4 kg)   BMI Readings from Last 1 Encounters:  06/16/19 30.41 kg/m    *Unable to obtain current vital signs, weight, and BMI due to telephone visit type  Hearing/Vision  . Shelly Bennett did not seem to have difficulty with hearing/understanding during the telephone conversation . Reports that she has had a formal eye exam by an eye care professional within the past year . Reports that she has not had a formal hearing evaluation within the past year *Unable to fully assess hearing and vision during telephone visit type  Cognitive Function: 6CIT Screen 06/16/2019  What Year? 0 points  What month? 0 points  What time? 0 points  Count back from 20 0 points  Months in reverse 0 points  Repeat phrase 0 points  Total  Score 0   (Normal:0-7, Significant for Dysfunction: >8)  Normal  Cognitive Function Screening: Yes   Immunization & Health Maintenance Record Immunization History  Administered Date(s) Administered  . Influenza Whole 11/16/2012  . Influenza, High Dose Seasonal PF 09/15/2016, 09/04/2017, 09/24/2018  . Influenza,inj,Quad PF,6+ Mos 09/15/2013, 10/06/2014, 11/01/2015  . Pneumococcal Conjugate-13 11/01/2015  . Pneumococcal Polysaccharide-23 09/04/2017  . Tdap 05/01/2017    Health Maintenance  Topic Date Due  . COLONOSCOPY  01/14/2019  . Hepatitis C Screening  05/02/2023 (Originally 02-06-1951)  . INFLUENZA VACCINE  07/18/2019  . COLON CANCER SCREENING ANNUAL FOBT  11/11/2019  . MAMMOGRAM  12/24/2020  . TETANUS/TDAP  05/02/2027  . DEXA SCAN  Completed  . PNA vac Low Risk Adult  Completed       Assessment  This is a routine wellness examination for Shelly Bennett.  Health Maintenance: Due or Overdue Health Maintenance Due  Topic Date Due  . COLONOSCOPY  01/14/2019    Shelly Bennett does not need a referral for Community Assistance: Care Management:   no Social Work:    no Prescription Assistance:  no Nutrition/Diabetes Education:  no   Plan:  Personalized Goals Goals Addressed   None    Personalized Health Maintenance & Screening Recommendations  Advanced directives: has NO advanced directive  - add't info requested. Referral to SW: no [facility]  Lung Cancer Screening Recommended: no (Low Dose CT Chest recommended if Age 30-80 years, 30 pack-year currently smoking OR have quit w/in past 15 years) Hepatitis C Screening recommended: yes HIV Screening recommended: no  Advanced Directives: Written information was prepared per patient's request.  Referrals & Orders No orders of the defined types were placed in this encounter.   Follow-up Plan . Follow-up with Chipper Herb, MD as planned  I have personally reviewed and noted the following in the patient's chart:   . Medical and social history . Use of alcohol,  tobacco or illicit drugs  . Current medications and supplements . Functional ability and status . Nutritional status . Physical activity . Advanced directives . List of other physicians . Hospitalizations, surgeries, and ER visits in previous 12 months . Vitals . Screenings to include cognitive, depression, and falls . Referrals and appointments  In addition, I have reviewed and discussed with Shelly Bennett certain preventive protocols, quality metrics, and best practice recommendations. A written personalized care plan for preventive services as well as general preventive health recommendations is available and can be mailed to the patient at her request.      Wardell Heath, LPN  3/33/8329   I have reviewed and agree with the above AWV documentation.   Evelina Dun, FNP

## 2019-06-17 ENCOUNTER — Other Ambulatory Visit: Payer: Self-pay | Admitting: *Deleted

## 2019-06-17 ENCOUNTER — Ambulatory Visit (INDEPENDENT_AMBULATORY_CARE_PROVIDER_SITE_OTHER): Payer: Medicare Other | Admitting: Family Medicine

## 2019-06-17 ENCOUNTER — Encounter: Payer: Self-pay | Admitting: Family Medicine

## 2019-06-17 ENCOUNTER — Other Ambulatory Visit: Payer: Self-pay

## 2019-06-17 DIAGNOSIS — I700 Atherosclerosis of aorta: Secondary | ICD-10-CM

## 2019-06-17 DIAGNOSIS — Z86010 Personal history of colonic polyps: Secondary | ICD-10-CM

## 2019-06-17 DIAGNOSIS — E559 Vitamin D deficiency, unspecified: Secondary | ICD-10-CM

## 2019-06-17 DIAGNOSIS — I2510 Atherosclerotic heart disease of native coronary artery without angina pectoris: Secondary | ICD-10-CM | POA: Diagnosis not present

## 2019-06-17 DIAGNOSIS — I10 Essential (primary) hypertension: Secondary | ICD-10-CM

## 2019-06-17 DIAGNOSIS — E782 Mixed hyperlipidemia: Secondary | ICD-10-CM | POA: Diagnosis not present

## 2019-06-17 DIAGNOSIS — M8580 Other specified disorders of bone density and structure, unspecified site: Secondary | ICD-10-CM

## 2019-06-17 DIAGNOSIS — R413 Other amnesia: Secondary | ICD-10-CM

## 2019-06-17 DIAGNOSIS — I350 Nonrheumatic aortic (valve) stenosis: Secondary | ICD-10-CM

## 2019-06-17 DIAGNOSIS — K589 Irritable bowel syndrome without diarrhea: Secondary | ICD-10-CM

## 2019-06-17 DIAGNOSIS — F339 Major depressive disorder, recurrent, unspecified: Secondary | ICD-10-CM

## 2019-06-17 DIAGNOSIS — J301 Allergic rhinitis due to pollen: Secondary | ICD-10-CM

## 2019-06-17 DIAGNOSIS — K219 Gastro-esophageal reflux disease without esophagitis: Secondary | ICD-10-CM

## 2019-06-17 NOTE — Patient Instructions (Addendum)
Continue regular follow-up with cardiology Continue to get colonoscopies with Dr. Carlean Purl because of family history of colon cancer Continue to practice good hand and respiratory hygiene Continue to stay active physically monitor blood pressures closely watch sodium intake and always be careful not to put self at risk for falling Get DEXA scans every 2 years Get eye exams regularly Patient will call and schedule visit with her neurosurgeon because of increased falling episodes Patient will call and reschedule visit with Dr. Carlean Purl as mentioned above because of needing colonoscopy with family history of colon cancer Continue to keep eye exams up-to-date Patient will also call and make sure that she has her yearly visit scheduled with her cardiologist.

## 2019-06-17 NOTE — Progress Notes (Signed)
Virtual Visit Via telephone Note I connected with@ on 06/17/19 by telephone and verified that I am speaking with the correct person or authorized healthcare agent using two identifiers. Shelly Bennett is currently located at home and there are no unauthorized people in close proximity. I completed this visit while in a private location in my home .  This visit type was conducted due to national recommendations for restrictions regarding the COVID-19 Pandemic (e.g. social distancing).  This format is felt to be most appropriate for this patient at this time.  All issues noted in this document were discussed and addressed.  No physical exam was performed.    I discussed the limitations, risks, security and privacy concerns of performing an evaluation and management service by telephone and the availability of in person appointments. I also discussed with the patient that there may be a patient responsible charge related to this service. The patient expressed understanding and agreed to proceed.   Date:  06/17/2019    ID:  Shelly Bennett      Aug 30, 1951        [id number]   Patient Care Team Patient Care Team: Chipper Herb, MD as PCP - General (Family Medicine) Hillary Bow, MD (Cardiology) Sherren Mocha, MD (Cardiology) Gatha Mayer, MD (Gastroenterology) Newton Pigg, MD (Obstetrics and Gynecology) Garald Balding, MD (Orthopedic Surgery) Marcial Pacas, MD as Consulting Physician (Neurology)  Reason for Visit: Primary Care Follow-up     History of Present Illness & Review of Systems:     Shelly Bennett is a 68 y.o. year old female primary care patient that presents today for a telehealth visit.  The patient is alert and doing well and does complain of ongoing left shoulder pain she is also been having trouble with her left hand pain and has seen the orthopedist about this.  She denies any outright chest pain or shortness of breath.  She does have  problems with irritable bowel syndrome more diarrhea type.  She also has a family history of colon cancer and is past due on getting her colonoscopy.  She denies any trouble with heartburn indigestion nausea vomiting blood in the stool or black tarry bowel movements.  She is passing her water well.  She is up-to-date on her eye exams.  She sees Dr. Burt Knack, her cardiologist regularly.  The family is still concerned about her memory and we did attempt to schedule her to go to [facility] and according to my recollection she call back and says she did not want to go at that time.  Now she is wanting to reschedule that visit and we will have our office to do that.  Review of systems as stated, otherwise negative.  The patient does not have symptoms concerning for COVID-19 infection (fever, chills, cough, or new shortness of breath).      Current Medications (Verified) Allergies as of 06/17/2019      Reactions   Codeine    ? Reaction ER visit.    Mold Extract [trichophyton]    Migraine   Penicillins Other (See Comments)   "drew my legs up as child" Pt has tolerated cephalexin in the past.   Ace Inhibitors Cough   Angiotensin Receptor Blockers Cough   Levaquin [levofloxacin] Rash   Per notes from outpatient provider   Vytorin [ezetimibe-simvastatin] Other (See Comments)   cramping   Zetia [ezetimibe] Other (See Comments)   cramping  Medication List       Accurate as of June 17, 2019  8:35 AM. If you have any questions, ask your nurse or doctor.        amLODipine 10 MG tablet Commonly known as: NORVASC TAKE (1/2) TABLET DAILY.   aspirin 81 MG tablet Take 81 mg by mouth daily.   calcium carbonate 200 MG capsule Take 250 mg by mouth 2 (two) times daily.   fexofenadine 180 MG tablet Commonly known as: ALLEGRA Take 180 mg by mouth daily.   fluticasone 50 MCG/ACT nasal spray Commonly known as: FLONASE Place 2 sprays into both nostrils daily.    hydrochlorothiazide 25 MG tablet Commonly known as: HYDRODIURIL TAKE (1/2) TABLET DAILY.   montelukast 10 MG tablet Commonly known as: SINGULAIR Take 1 tablet (10 mg total) by mouth at bedtime.   omega-3 acid ethyl esters 1 g capsule Commonly known as: LOVAZA TAKE (1) CAPSULE FOUR TIMES DAILY.   PARoxetine 20 MG tablet Commonly known as: PAXIL Take 1.5 tablets (30 mg total) by mouth at bedtime.   rosuvastatin 20 MG tablet Commonly known as: CRESTOR Take 1 tablet (20 mg total) by mouth daily.   traMADol 50 MG tablet Commonly known as: ULTRAM Take by mouth every 6 (six) hours as needed.   Vitamin D-3 125 MCG (5000 UT) Tabs Take 1 capsule by mouth as directed. Take one capsule by mouth daily Mon thru Friday           Allergies (Verified)    Codeine, Mold extract [trichophyton], Penicillins, Ace inhibitors, Angiotensin receptor blockers, Levaquin [levofloxacin], Vytorin [ezetimibe-simvastatin], and Zetia [ezetimibe]  Past Medical History Past Medical History:  Diagnosis Date  . Adenomatous colon polyp   . Allergy   . Anxiety   . Arthritis   . CAD (coronary artery disease)    Dr. Burt Knack follows   . Chronic headaches   . Cystocele   . Diverticulosis   . External hemorrhoids   . HLD (hyperlipidemia)   . HTN (hypertension)   . IBS (irritable bowel syndrome)   . Memory loss   . Obesity   . Pneumonia   . Skin cancer    squamous cell - face     Past Surgical History:  Procedure Laterality Date  . CARPAL TUNNEL RELEASE    . COLONOSCOPY  12/15/2008   diverticulosis, external hemorrhoids  . KNEE SURGERY  05/22/2015   right  . LAPAROSCOPY    . SKIN SURGERY     squamous cell - right face     Social History   Socioeconomic History  . Marital status: Married    Spouse name: [name]  . Number of children: 4  . Years of education: 16  . Highest education level: Bachelor's degree (e.g., BA, AB, BS)  Occupational History  . Occupation: Pharmacist, hospital retired  Photographer  . Financial resource strain: Not hard at all  . Food insecurity    Worry: Never true    Inability: Never true  . Transportation needs    Medical: No    Non-medical: No  Tobacco Use  . Smoking status: Never Smoker  . Smokeless tobacco: Never Used  Substance and Sexual Activity  . Alcohol use: No  . Drug use: No  . Sexual activity: Not Currently    Birth control/protection: None  Lifestyle  . Physical activity    Days per week: 0 days    Minutes per session: 0 min  . Stress: Only a little  Relationships  .  Social connections    Talks on phone: More than three times a week    Gets together: More than three times a week    Attends religious service: More than 4 times per year    Active member of club or organization: Yes    Attends meetings of clubs or organizations: More than 4 times per year    Relationship status: Married  Other Topics Concern  . Not on file  Social History Narrative   Lives at home with her husband.   Right-handed.   One cup coffee each morning and one soda each day.     Family History  Problem Relation Age of Onset  . Prostate cancer Father   . Colon cancer Father 63  . Kidney disease Father   . Heart disease Father   . Alzheimer's disease Father   . Hyperlipidemia Mother   . Hypertension Mother   . Kidney disease Mother   . Osteoporosis Mother   . Heart murmur Mother   . Dementia Mother   . Hyperlipidemia Sister   . Hypertension Sister   . Heart disease Brother 66       not dx questionable   . Heart disease Maternal Grandfather       Labs/Other Tests and Data Reviewed:    Wt Readings from Last 3 Encounters:  06/16/19 200 lb (90.7 kg)  05/27/19 200 lb (90.7 kg)  05/22/19 206 lb (93.4 kg)   Temp Readings from Last 3 Encounters:  05/22/19 97.6 F (36.4 C)  02/05/19 98.6 F (37 C) (Oral)  11/21/18 98 F (36.7 C) (Oral)   BP Readings from Last 3 Encounters:  05/27/19 (!) 157/89  05/22/19 128/79  02/05/19 135/75   Pulse  Readings from Last 3 Encounters:  05/27/19 60  05/22/19 84  02/05/19 62     No results found for: HGBA1C Lab Results  Component Value Date   LDLCALC 97 02/05/2019   CREATININE 0.72 02/05/2019       Chemistry      Component Value Date/Time   NA 144 02/05/2019 1126   K 4.5 02/05/2019 1126   CL 106 02/05/2019 1126   CO2 24 02/05/2019 1126   BUN 10 02/05/2019 1126   CREATININE 0.72 02/05/2019 1126      Component Value Date/Time   CALCIUM 9.9 02/05/2019 1126   ALKPHOS 83 02/05/2019 1126   AST 26 02/05/2019 1126   ALT 18 02/05/2019 1126   BILITOT 0.3 02/05/2019 1126         OBSERVATIONS/ OBJECTIVE:     The patient is alert today and has no blood pressure readings to report.  She says her weight is up and is 190 pounds.  She does not have any pulse rates.  She promises to take more time to take more care of herself and we will rearrange this visit to the [facility] in [place] for her memory impairment issues that her family feels like she has.  She also promises to reschedule her visit for her colonoscopy with Dr. Carlean Purl.  She will keep her regular appointments with Dr. Burt Knack.  She will also call and schedule a visit with her neurosurgeon because of the frequency of falling that she has had.  Physical exam deferred due to nature of telephonic visit.  ASSESSMENT & PLAN    Time:   Today, I have spent 26 minutes with the patient via telephone discussing the above including Covid precautions.  Visit Diagnoses: 1. Seasonal allergic rhinitis due to pollen -Continue with current treatment which includes Flonase Allegra and Singulair  2. Vitamin D deficiency -Continue with vitamin D replacement pending results of lab work -To need to make every effort not to put self at risk for falling and do DEXA scans every 2 years  3. Mixed hyperlipidemia -Continue follow-up with cardiology and continue with Crestor and aggressive  therapeutic lifestyle changes geared to losing weight  4. Arteriosclerotic cardiovascular disease (ASCVD) -Follow-up with Dr. Burt Knack as planned  5. Aortic atherosclerosis (HCC) -Continue Crestor and aggressive therapeutic lifestyle changes  6. Hypertension -Patient does not check blood pressures at home and she will try to make an effort to do this more often and watch her sodium intake and lose weight  7. Gastroesophageal reflux disease, esophagitis presence not specified -No complaints today with reflux symptoms.  Patient is past due on getting her colonoscopy and she promises to call and schedule this with Dr. Carlean Purl as soon as possible  8. Aortic valve stenosis, mild -Follow-up with Dr. Burt Knack regularly  9. Depression, recurrent ([place]) -Currently doing well with this and she will stay on her current treatment regimen  10. Osteopenia, unspecified location -Get DEXA scan every 2 years  4.  Memory impairment -Reschedule visit at [place] [facility]  12.  Family history colon cancer patient is past due colonoscopy with Dr. Katha Cabal and she promises to call and reschedule that.  13.  Increased falling episodes -Orthopedist has recommended that patient go back to see a neurosurgeon and she will call and schedule appointment with neurosurgery for reevaluation.     The above assessment and management plan was discussed with the patient. The patient verbalized understanding of and has agreed to the management plan. Patient is aware to call the clinic if symptoms persist or worsen. Patient is aware when to return to the clinic for a follow-up visit. Patient educated on when it is appropriate to go to the emergency department.    Chipper Herb, MD [address], [place], [place] [address] Ph [phone]   Arrie Senate MD

## 2019-06-17 NOTE — Addendum Note (Signed)
Addended by: Thana Ates on: 06/17/2019 02:33 PM   Modules accepted: Orders

## 2019-06-24 ENCOUNTER — Ambulatory Visit (INDEPENDENT_AMBULATORY_CARE_PROVIDER_SITE_OTHER): Payer: Medicare Other | Admitting: Orthopedic Surgery

## 2019-06-24 ENCOUNTER — Other Ambulatory Visit: Payer: Self-pay

## 2019-06-24 ENCOUNTER — Encounter: Payer: Self-pay | Admitting: Orthopedic Surgery

## 2019-06-24 DIAGNOSIS — M65331 Trigger finger, right middle finger: Secondary | ICD-10-CM | POA: Insufficient documentation

## 2019-06-24 DIAGNOSIS — M654 Radial styloid tenosynovitis [de Quervain]: Secondary | ICD-10-CM | POA: Insufficient documentation

## 2019-06-24 MED ORDER — METHYLPREDNISOLONE ACETATE 40 MG/ML IJ SUSP
13.3300 mg | INTRAMUSCULAR | Status: AC | PRN
  Administered 2019-06-24: 13.33 mg

## 2019-06-24 MED ORDER — BUPIVACAINE HCL 0.25 % IJ SOLN
0.3300 mL | INTRAMUSCULAR | Status: AC | PRN
  Administered 2019-06-24: .33 mL

## 2019-06-24 MED ORDER — LIDOCAINE HCL 1 % IJ SOLN
0.3000 mL | INTRAMUSCULAR | Status: AC | PRN
  Administered 2019-06-24: .3 mL

## 2019-06-24 NOTE — Progress Notes (Signed)
Office Visit Note   Patient: Shelly Bennett           Date of Birth: Feb 27, 1951           MRN: [id number] Visit Date: 06/24/2019              Requested by: Dettinger, Fransisca Kaufmann, MD [place],  [place] [address] PCP: Dettinger, Fransisca Kaufmann, MD   Assessment & Plan: Visit Diagnoses:  1. Radial styloid tenosynovitis (de quervain)   2. Trigger finger, right middle finger     Plan:  #1:  Corticosteroid injection first dorsal compartment #2:  Wear wrist splint that she has at home #3:  Given finger splints for her triggering fingers   Follow-Up Instructions: Return if symptoms worsen or fail to improve.   Orders:  Orders Placed This Encounter  Procedures  . Hand/UE Inj   No orders of the defined types were placed in this encounter.     Procedures: Hand/UE Inj: L extensor compartment 1 for de Quervain's tenosynovitis on 06/24/2019 12:29 PM Medications: 0.3 mL lidocaine 1 %; 0.33 mL bupivacaine 0.25 %; 13.33 mg methylPREDNISolone acetate 40 MG/ML Outcome: tolerated well, no immediate complications Procedure, treatment alternatives, risks and benefits explained, specific risks discussed. Immediately prior to procedure a time out was called to verify the correct patient, procedure, equipment, support staff and site/side marked as required. Patient was prepped and draped in the usual sterile fashion.       Clinical Data: No additional findings.   Subjective: Chief Complaint  Patient presents with  . Left Wrist - Pain, Injury    HPI: Shelly Bennett is a 68 year old female who fell x 1 month. Her right wrist is hurting. She has been using Voltaren Gel which is not helping. She is having swelling, painful to touch, numbness, tingling and weakness. She is taking Tylenol and Tramadol which helps some. She is wearing a brace which helps. Pain at radial styloid and wrist.  Had thumb spica wrist immobilizer     Review of Systems  Constitutional: Negative for fatigue.   HENT: Negative for trouble swallowing.   Eyes: Negative for pain.  Respiratory: Negative for shortness of breath.   Cardiovascular: Negative for leg swelling.  Gastrointestinal: Negative for constipation.  Endocrine: Negative for cold intolerance.  Genitourinary: Negative for difficulty urinating.  Musculoskeletal: Positive for joint swelling.  Skin: Negative for rash.  Allergic/Immunologic: Positive for environmental allergies.  Neurological: Positive for weakness and numbness.  Hematological: Does not bruise/bleed easily.  Psychiatric/Behavioral: Negative for sleep disturbance.     Objective: Vital Signs: BP 138/78 (BP Location: Right Arm, Patient Position: Sitting, Cuff Size: Normal)   Pulse 64   Resp 16   Ht 5\' 8"  (1.727 m)   Wt 190 lb (86.2 kg)   BMI 28.89 kg/m   Physical Exam Constitutional:      Appearance: She is well-developed.  Eyes:     Pupils: Pupils are equal, round, and reactive to light.  Pulmonary:     Effort: Pulmonary effort is normal.  Skin:    General: Skin is warm and dry.  Neurological:     Mental Status: She is alert and oriented to person, place, and time.  Psychiatric:        Behavior: Behavior normal.     Ortho Exam  Exam reveals tenderness over the first dorsal compartment of the left wrist.  Positive Finkelstein test.  Some mild triggering in the index, long and ring finger.  NV intact.  Skin intact   Specialty Comments:  No specialty comments available.  Imaging: No results found.  No x-rays obtained   PMFS History: Current Outpatient Medications  Medication Sig Dispense Refill  . amLODipine (NORVASC) 10 MG tablet TAKE (1/2) TABLET DAILY. 45 tablet 3  . aspirin 81 MG tablet Take 81 mg by mouth daily.     . calcium carbonate 200 MG capsule Take 250 mg by mouth 2 (two) times daily.     . Cholecalciferol (VITAMIN D-3) 5000 UNITS TABS Take 1 capsule by mouth as directed. Take one capsule by mouth daily Mon thru Friday    .  fexofenadine (ALLEGRA) 180 MG tablet Take 180 mg by mouth daily.    . fluticasone (FLONASE) 50 MCG/ACT nasal spray Place 2 sprays into both nostrils daily. 16 g 3  . hydrochlorothiazide (HYDRODIURIL) 25 MG tablet TAKE (1/2) TABLET DAILY. 45 tablet 3  . omega-3 acid ethyl esters (LOVAZA) 1 g capsule TAKE (1) CAPSULE FOUR TIMES DAILY. 360 capsule 3  . PARoxetine (PAXIL) 20 MG tablet Take 1.5 tablets (30 mg total) by mouth at bedtime. 135 tablet 2  . rosuvastatin (CRESTOR) 20 MG tablet Take 1 tablet (20 mg total) by mouth daily. 90 tablet 3  . traMADol (ULTRAM) 50 MG tablet Take by mouth every 6 (six) hours as needed.    . montelukast (SINGULAIR) 10 MG tablet Take 1 tablet (10 mg total) by mouth at bedtime. (Patient not taking: Reported on 06/24/2019) 30 tablet 3   No current facility-administered medications for this visit.     Patient Active Problem List   Diagnosis Date Noted  . Radial styloid tenosynovitis (de quervain) 06/24/2019  . Trigger finger, right middle finger 06/24/2019  . Pain in left wrist 05/27/2019  . Trigger thumb, right thumb 10/29/2018  . Memory loss 07/22/2018  . Low back pain 07/22/2018  . Left lumbar radiculopathy 01/22/2018  . Mild cognitive impairment 10/22/2017  . Anxiety 10/22/2017  . Otitis of left ear 02/01/2017  . Aortic atherosclerosis ([place]) 12/04/2016  . Allergic drug reaction 06/25/2015  . Dehydration with hyponatremia 06/25/2015  . HCAP (healthcare-associated pneumonia) 06/25/2015  . Vitamin D deficiency 08/19/2014  . Family history of colon cancer-father at 70+ grandparent w/ rectal cancer 01/14/2014  . Osteopenia 08/12/2013  . CAD (coronary artery disease), native coronary artery 03/22/2013  . Aortic valve stenosis, mild 03/22/2013  . Hyperlipemia 03/11/2013  . Hypertension 03/11/2013  . Allergic rhinitis 03/11/2013  . Left shoulder pain 03/11/2013  . IBS (irritable bowel syndrome) - with chronic recurrent abdominal pain 02/01/2012  . Personal  history of colonic polyps - adenoma 02/01/2012  . Coccydynia 02/01/2012   Past Medical History:  Diagnosis Date  . Adenomatous colon polyp   . Allergy   . Anxiety   . Arthritis   . CAD (coronary artery disease)    Dr. Burt Knack follows   . Chronic headaches   . Cystocele   . Diverticulosis   . External hemorrhoids   . HLD (hyperlipidemia)   . HTN (hypertension)   . IBS (irritable bowel syndrome)   . Memory loss   . Obesity   . Pneumonia   . Skin cancer    squamous cell - face    Family History  Problem Relation Age of Onset  . Prostate cancer Father   . Colon cancer Father 45  . Kidney disease Father   . Heart disease Father   . Alzheimer's disease Father   . Hyperlipidemia Mother   .  Hypertension Mother   . Kidney disease Mother   . Osteoporosis Mother   . Heart murmur Mother   . Dementia Mother   . Hyperlipidemia Sister   . Hypertension Sister   . Heart disease Brother 31       not dx questionable   . Heart disease Maternal Grandfather     Past Surgical History:  Procedure Laterality Date  . CARPAL TUNNEL RELEASE    . COLONOSCOPY  12/15/2008   diverticulosis, external hemorrhoids  . KNEE SURGERY  05/22/2015   right  . LAPAROSCOPY    . SKIN SURGERY     squamous cell - right face    Social History   Occupational History  . Occupation: Pharmacist, hospital retired  Tobacco Use  . Smoking status: Never Smoker  . Smokeless tobacco: Never Used  Substance and Sexual Activity  . Alcohol use: No  . Drug use: No  . Sexual activity: Not Currently    Birth control/protection: None

## 2019-07-03 ENCOUNTER — Ambulatory Visit: Payer: Medicare Other | Admitting: Family Medicine

## 2019-07-07 ENCOUNTER — Ambulatory Visit: Payer: Medicare Other | Admitting: Family Medicine

## 2019-07-14 ENCOUNTER — Ambulatory Visit (AMBULATORY_SURGERY_CENTER): Payer: Medicare Other | Admitting: *Deleted

## 2019-07-14 ENCOUNTER — Other Ambulatory Visit: Payer: Self-pay

## 2019-07-14 ENCOUNTER — Telehealth: Payer: Self-pay | Admitting: Internal Medicine

## 2019-07-14 VITALS — Ht 68.0 in | Wt 195.0 lb

## 2019-07-14 DIAGNOSIS — Z86010 Personal history of colonic polyps: Secondary | ICD-10-CM

## 2019-07-14 NOTE — Telephone Encounter (Signed)
Pt wanted to

## 2019-07-14 NOTE — Telephone Encounter (Signed)
Pt wanted to Rs her colon- Colon RS to 8-19 WED 11 am - new instructions printed and mailed

## 2019-07-14 NOTE — Telephone Encounter (Signed)
Patient would like to speak to you regarding the pre-viosit she had his morning.

## 2019-07-14 NOTE — Progress Notes (Signed)
No egg or soy allergy known to patient  No issues with past sedation with any surgeries  or procedures, no intubation problems - hard to wake up post op  No diet pills per patient No home 02 use per patient  No blood thinners per patient  Pt denies issues with constipation - she has issues with diarrhea, no constipation  No A fib or A flutter  EMMI video sent to pt's e mail   Pt verified name, DOB, address and insurance during PV today. Pt mailed instruction packet to included paper to complete and mail back to [facility] with addressed and stamped envelope, Emmi video, copy of consent form to read and not return, and instructions. PV completed over the phone. Pt encouraged to call with questions or issues   Pt is aware that care partner will wait in the car during procedure; if they feel like they will be too hot to wait in the car; they may wait in the lobby.  We want them to wear a mask (we do not have any that we can provide them), practice social distancing, and we will check their temperatures when they get here.  I did remind patient that their care partner needs to stay in the parking lot the entire time. Pt will wear mask into building.

## 2019-07-27 ENCOUNTER — Encounter: Payer: Medicare Other | Admitting: Internal Medicine

## 2019-07-30 ENCOUNTER — Encounter: Payer: Self-pay | Admitting: Internal Medicine

## 2019-08-04 ENCOUNTER — Telehealth: Payer: Self-pay

## 2019-08-04 NOTE — Telephone Encounter (Signed)
Pt responded "no" to all questions.  °

## 2019-08-04 NOTE — Telephone Encounter (Signed)
Covid-19 screening questions   Do you now or have you had a fever in the last 14 days?  Do you have any respiratory symptoms of shortness of breath or cough now or in the last 14 days?  Do you have any family members or close contacts with diagnosed or suspected Covid-19 in the past 14 days?  Have you been tested for Covid-19 and found to be positive?     Left message to c/b for covid screening

## 2019-08-05 ENCOUNTER — Other Ambulatory Visit: Payer: Self-pay

## 2019-08-05 ENCOUNTER — Encounter: Payer: Self-pay | Admitting: Internal Medicine

## 2019-08-05 ENCOUNTER — Ambulatory Visit (AMBULATORY_SURGERY_CENTER): Payer: Medicare Other | Admitting: Internal Medicine

## 2019-08-05 VITALS — BP 139/67 | HR 54 | Temp 96.6°F | Resp 36 | Ht 68.0 in | Wt 195.0 lb

## 2019-08-05 DIAGNOSIS — D123 Benign neoplasm of transverse colon: Secondary | ICD-10-CM

## 2019-08-05 DIAGNOSIS — D124 Benign neoplasm of descending colon: Secondary | ICD-10-CM | POA: Diagnosis not present

## 2019-08-05 DIAGNOSIS — D120 Benign neoplasm of cecum: Secondary | ICD-10-CM

## 2019-08-05 DIAGNOSIS — Z86010 Personal history of colonic polyps: Secondary | ICD-10-CM | POA: Diagnosis not present

## 2019-08-05 MED ORDER — SODIUM CHLORIDE 0.9 % IV SOLN: 500.0000 mL | Freq: Once | INTRAVENOUS | Status: DC

## 2019-08-05 NOTE — Progress Notes (Signed)
JB- Temp CW- Vitals 

## 2019-08-05 NOTE — Op Note (Signed)
[facility] Patient Name: Shelly Bennett Procedure Date: 08/05/2019 11:46 AM MRN: [id number] Endoscopist: Gatha Mayer , MD Age: 68 Referring MD:  Date of Birth: [birth year] Gender: Female Account #: [id number] Procedure:                Colonoscopy Indications:              Surveillance: Personal history of adenomatous                            polyps on last colonoscopy 5 years ago Medicines:                Propofol per Anesthesia, Monitored Anesthesia Care Procedure:                Pre-Anesthesia Assessment:                           - Prior to the procedure, a History and Physical                            was performed, and patient medications and                            allergies were reviewed. The patient's tolerance of                            previous anesthesia was also reviewed. The risks                            and benefits of the procedure and the sedation                            options and risks were discussed with the patient.                            All questions were answered, and informed consent                            was obtained. Prior Anticoagulants: The patient has                            taken no previous anticoagulant or antiplatelet                            agents. ASA Grade Assessment: III - A patient with                            severe systemic disease. After reviewing the risks                            and benefits, the patient was deemed in                            satisfactory condition to undergo the procedure.  After obtaining informed consent, the colonoscope                            was passed under direct vision. Throughout the                            procedure, the patient's blood pressure, pulse, and                            oxygen saturations were monitored continuously. The                            Colonoscope was introduced through the anus and       advanced to the the cecum, identified by                            appendiceal orifice and ileocecal valve. The                            colonoscopy was somewhat difficult due to                            significant looping. Successful completion of the                            procedure was aided by applying abdominal pressure.                            The patient tolerated the procedure well. The                            quality of the bowel preparation was good. The                            bowel preparation used was Miralax via split dose                            instruction. The ileocecal valve, appendiceal                            orifice, and rectum were photographed. Scope In: 11:57:42 AM Scope Out: 12:17:14 PM Scope Withdrawal Time: 0 hours 14 minutes 21 seconds  Total Procedure Duration: 0 hours 19 minutes 32 seconds  Findings:                 The perianal and digital rectal examinations were                            normal.                           Four sessile polyps were found in the descending                            colon, transverse colon and  cecum. The polyps were                            diminutive in size. These polyps were removed with                            a cold snare. Resection and retrieval were                            complete. Verification of patient identification                            for the specimen was done. Estimated blood loss was                            minimal.                           Multiple diverticula were found in the sigmoid                            colon and descending colon.                           The exam was otherwise without abnormality on                            direct and retroflexion views. Complications:            No immediate complications. Estimated Blood Loss:     Estimated blood loss was minimal. Impression:               - Four diminutive polyps in the descending colon,                             in the transverse colon and in the cecum, removed                            with a cold snare. Resected and retrieved.                           - Diverticulosis in the sigmoid colon and in the                            descending colon.                           - The examination was otherwise normal on direct                            and retroflexion views.                           - Personal history of colonic polyps. Recommendation:           - Patient has a contact number available for  emergencies. The signs and symptoms of potential                            delayed complications were discussed with the                            patient. Return to normal activities tomorrow.                            Written discharge instructions were provided to the                            patient.                           - Resume previous diet.                           - Continue present medications.                           - Repeat colonoscopy is recommended for                            surveillance. The colonoscopy date will be                            determined after pathology results from today's                            exam become available for review.                           - Will arrange office visit to furthjer evaluate                            fecal and urinary incontinence. Gatha Mayer, MD 08/05/2019 12:27:23 PM This report has been signed electronically.

## 2019-08-05 NOTE — Progress Notes (Signed)
PT taken to PACU. Monitors in place. VSS. Report given to RN. 

## 2019-08-05 NOTE — Patient Instructions (Addendum)
I found and removed 4 small polyps. I will let you know pathology results and when to have another routine colonoscopy by mail and/or My Chart.   We will make a follow-up visit to further evaluate and discuss fecal and urinary incontinence and how to help that.  Enjoy your [birth year]!  I appreciate the opportunity to care for you. Gatha Mayer, MD, FACG  YOU HAD AN ENDOSCOPIC PROCEDURE TODAY AT [facility]:   Refer to the procedure report that was given to you for any specific questions about what was found during the examination.  If the procedure report does not answer your questions, please call your gastroenterologist to clarify.  If you requested that your care partner not be given the details of your procedure findings, then the procedure report has been included in a sealed envelope for you to review at your convenience later.  YOU SHOULD EXPECT: Some feelings of bloating in the abdomen. Passage of more gas than usual.  Walking can help get rid of the air that was put into your GI tract during the procedure and reduce the bloating. If you had a lower endoscopy (such as a colonoscopy or flexible sigmoidoscopy) you may notice spotting of blood in your stool or on the toilet paper. If you underwent a bowel prep for your procedure, you may not have a normal bowel movement for a few days.  Please Note:  You might notice some irritation and congestion in your nose or some drainage.  This is from the oxygen used during your procedure.  There is no need for concern and it should clear up in a day or so.  SYMPTOMS TO REPORT IMMEDIATELY:   Following lower endoscopy (colonoscopy or flexible sigmoidoscopy):  Excessive amounts of blood in the stool  Significant tenderness or worsening of abdominal pains  Swelling of the abdomen that is new, acute  Fever of 100F or higher  For urgent or emergent issues, a gastroenterologist can be reached at any hour by calling [phone]  [phone].   DIET:  We do recommend a small meal at first, but then you may proceed to your regular diet.  Drink plenty of fluids but you should avoid alcoholic beverages for 24 hours.  ACTIVITY:  You should plan to take it easy for the rest of today and you should NOT DRIVE or use heavy machinery until tomorrow (because of the sedation medicines used during the test).    FOLLOW UP: Our staff will call the number listed on your records 48-72 hours following your procedure to check on you and address any questions or concerns that you may have regarding the information given to you following your procedure. If we do not reach you, we will leave a message.  We will attempt to reach you two times.  During this call, we will ask if you have developed any symptoms of COVID 19. If you develop any symptoms (ie: fever, flu-like symptoms, shortness of breath, cough etc.) before then, please call [phone].  If you test positive for Covid 19 in the 2 weeks post procedure, please call and report this information to [place].    If any biopsies were taken you will be contacted by phone or by letter within the next 1-3 weeks.  Please call us at [phone] if you have not heard about the biopsies in 3 weeks.    SIGNATURES/CONFIDENTIALITY: You and/or your care partner have signed paperwork which will be entered into your electronic medical record.  These signatures attest to the fact that that the information above on your After Visit Summary has been reviewed and is understood.  Full responsibility of the confidentiality of this discharge information lies with you and/or your care-partner.

## 2019-08-05 NOTE — Progress Notes (Signed)
Called to room to assist during endoscopic procedure.  Patient ID and intended procedure confirmed with present staff. Received instructions for my participation in the procedure from the performing physician.  

## 2019-08-05 NOTE — Progress Notes (Signed)
Pt's states no medical or surgical changes since previsit or office visit. 

## 2019-08-07 ENCOUNTER — Telehealth: Payer: Self-pay

## 2019-08-07 ENCOUNTER — Telehealth: Payer: Self-pay | Admitting: *Deleted

## 2019-08-07 NOTE — Telephone Encounter (Signed)
Follow up call to pt, left message for pt to call if problems, otherwise we will call back after noon today.

## 2019-08-07 NOTE — Telephone Encounter (Signed)
  Follow up Call-  Call back number 08/05/2019  Post procedure Call Back phone  # [phone]  Permission to leave phone message Yes  Some recent data might be hidden     Patient questions:  Do you have a fever, pain , or abdominal swelling? No. Pain Score  0 *  Have you tolerated food without any problems? Yes.    Have you been able to return to your normal activities? Yes.    Do you have any questions about your discharge instructions: Diet   No. Medications  No. Follow up visit  No.  Do you have questions or concerns about your Care? No.  Actions: * If pain score is 4 or above: No action needed, pain <4.   1. Have you developed a fever since your procedure? no  2.   Have you had an respiratory symptoms (SOB or cough) since your procedure?no  3.   Have you tested positive for COVID 19 since your procedure no  4.   Have you had any family members/close contacts diagnosed with the COVID 19 since your procedure?  no   If yes to any of these questions please route to Joylene John, RN and Alphonsa Gin, Therapist, sports.

## 2019-08-12 ENCOUNTER — Telehealth: Payer: Self-pay

## 2019-08-12 DIAGNOSIS — I350 Nonrheumatic aortic (valve) stenosis: Secondary | ICD-10-CM

## 2019-08-12 NOTE — Telephone Encounter (Signed)
Patient is due for echocardiogram and office visit with Dr. Burt Knack. Arranged echo and OV same day 9/17.

## 2019-08-14 ENCOUNTER — Encounter: Payer: Self-pay | Admitting: Internal Medicine

## 2019-08-31 ENCOUNTER — Other Ambulatory Visit: Payer: Self-pay | Admitting: Family Medicine

## 2019-09-03 ENCOUNTER — Telehealth: Payer: Self-pay | Admitting: *Deleted

## 2019-09-03 ENCOUNTER — Encounter: Payer: Self-pay | Admitting: Cardiovascular Disease

## 2019-09-03 ENCOUNTER — Other Ambulatory Visit: Payer: Self-pay

## 2019-09-03 ENCOUNTER — Ambulatory Visit (HOSPITAL_COMMUNITY): Payer: Medicare Other | Attending: Cardiovascular Disease

## 2019-09-03 ENCOUNTER — Ambulatory Visit (INDEPENDENT_AMBULATORY_CARE_PROVIDER_SITE_OTHER): Payer: Medicare Other | Admitting: Cardiovascular Disease

## 2019-09-03 VITALS — BP 158/76 | HR 60 | Ht 68.0 in | Wt 217.1 lb

## 2019-09-03 DIAGNOSIS — R51 Headache: Secondary | ICD-10-CM | POA: Diagnosis not present

## 2019-09-03 DIAGNOSIS — R5383 Other fatigue: Secondary | ICD-10-CM

## 2019-09-03 DIAGNOSIS — I350 Nonrheumatic aortic (valve) stenosis: Secondary | ICD-10-CM

## 2019-09-03 DIAGNOSIS — R079 Chest pain, unspecified: Secondary | ICD-10-CM | POA: Diagnosis not present

## 2019-09-03 DIAGNOSIS — I10 Essential (primary) hypertension: Secondary | ICD-10-CM

## 2019-09-03 DIAGNOSIS — R0683 Snoring: Secondary | ICD-10-CM

## 2019-09-03 DIAGNOSIS — Q231 Congenital insufficiency of aortic valve: Secondary | ICD-10-CM

## 2019-09-03 DIAGNOSIS — R519 Headache, unspecified: Secondary | ICD-10-CM

## 2019-09-03 DIAGNOSIS — E782 Mixed hyperlipidemia: Secondary | ICD-10-CM

## 2019-09-03 NOTE — Telephone Encounter (Signed)
Dr. Burt Knack recommends you have a SLEEP STUDY

## 2019-09-03 NOTE — Patient Instructions (Addendum)
Medication Instructions:  Your provider recommends that you continue on your current medications as directed. Please refer to the Current Medication list given to you today.    Labwork: TODAY: BMET  Testing/Procedures: Dr. Burt Knack recommends you have a coronary CT (instructions below).  Dr. Burt Knack recommends you have a SLEEP STUDY. This test records several body functions during sleep, including: brain activity, eye movement, oxygen and carbon dioxide blood levels, heart rate and rhythm, breathing rate and rhythm, the flow of air through your mouth and nose, snoring, body muscle movements, and chest and belly movement.  Follow-Up: Valetta Fuller will call you to arrange your 1 year echo and office visit for next summer.    Please check your BLOOD PRESSURE a couple times a week and let us know the results.  Please talk with Dr. Warrick Parisian about your memory issues.  CT INSTRUCTIONS:  [facility] [address] [place], [place] [address] [phone]   Please arrive at the [facility] main entrance of [facility] 30-45 minutes prior to test start time. Proceed to the [facility] Department (first floor) to check-in and test prep.  On the Night Before the Test: . Be sure to Drink plenty of water. . Do not consume any caffeinated/decaffeinated beverages or chocolate 12 hours prior to your test. . Do not take any antihistamines 12 hours prior to your test.  On the Day of the Test: . Drink plenty of water. Do not drink any water within one hour of the test. . Do not eat any food 4 hours prior to the test. . HOLD Hydrochlorothiazide morning of the test, but you may take everything else as directed. . Please wear underwire-free bra if available      After the Test: . Drink plenty of water. . After receiving IV contrast, you may experience a mild flushed feeling. This is normal. . On occasion, you may experience a mild rash up to 24 hours after the test. This is  not dangerous. If this occurs, you can take Benadryl 25 mg and increase your fluid intake. . If you experience trouble breathing, this can be serious. If it is severe call 911 IMMEDIATELY. If it is mild, please call our office.   Please contact the cardiac imaging nurse navigator should you have any questions/concerns Marchia Bond, RN Navigator Card[facility] [phone] Office  [phone] Cell

## 2019-09-03 NOTE — Telephone Encounter (Signed)
-----   Message from Theodoro Parma, RN sent at 09/03/2019  1:25 PM EDT ----- Regarding: cCT, sleep study precert/FYI Johnnette Barrios: Dr. Burt Knack has ordered a cCT for Lauren's mom- she lives in [place] so I told her we'd try get scheduled in [place]- I told her you'd be in touch to discuss sleep study information  Thank you! KK

## 2019-09-03 NOTE — Progress Notes (Signed)
Cardiology Office Note:    Date:  09/03/2019   ID:  Shelly Bennett, DOB 06-24-1951, MRN [id number]  PCP:  Dettinger, Fransisca Kaufmann, MD  Cardiologist:  Sherren Mocha, MD  Electrophysiologist:  None   Referring MD: Dettinger, Fransisca Kaufmann, MD   Chief Complaint  Patient presents with  . Chest Pain    History of Present Illness:    Shelly Bennett is a 68 y.o. female with a hx of nonobstructive CAD and bicuspid aortic valve, presenting for follow-up evaluation.   She reports left sided chest discomfort in the upper chest. Symptoms are not related to physical exertion. She reports worsening fatigue and naps frequently. She complains of worsening shortness of breath with activity. No orthopnea, PND, or leg swelling. She has been having vivid dreams. She reports increasing difficulty with recurrent falls, sounds like mechanical falls.  She is also had problems with her memory and her children are concerned that she is starting to show signs of early dementia.  She is going to review this with her primary care physician in the near future.  She feels like it is "just her age."   Past Medical History:  Diagnosis Date  . Adenomatous colon polyp   . Allergic drug reaction 06/25/2015  . Allergy   . Anxiety   . Arthritis   . At risk for falls    fallen 4 times recently   . CAD (coronary artery disease)    Dr. Burt Knack follows   . Carpal tunnel syndrome   . Chronic headaches   . Coccydynia 02/01/2012  . Cystocele   . Diverticulosis   . External hemorrhoids   . Focal nodular hyperplasia of liver   . GERD (gastroesophageal reflux disease)    some  . HCAP (healthcare-associated pneumonia) 06/25/2015  . Heart murmur   . HLD (hyperlipidemia)   . HTN (hypertension)   . IBS (irritable bowel syndrome)   . Memory loss   . Neuromuscular disorder ([place])    some neuropathy issues in the past- legs fibromyalgia in past-- past hx of steroid injections  . Obesity   . Osteoporosis   . Otitis of  left ear 02/01/2017  . Pneumonia   . Skin cancer    squamous cell - face    Past Surgical History:  Procedure Laterality Date  . CARPAL TUNNEL RELEASE    . COLONOSCOPY  12/15/2008   diverticulosis, external hemorrhoids  . KNEE SURGERY  05/22/2015   right  . LAPAROSCOPY    . SKIN SURGERY     squamous cell - right face     Current Medications: Current Meds  Medication Sig  . amLODipine (NORVASC) 10 MG tablet TAKE (1/2) TABLET DAILY.  Marland Kitchen aspirin 81 MG tablet Take 81 mg by mouth daily.   . calcium carbonate 200 MG capsule Take 250 mg by mouth 2 (two) times daily.   . Cholecalciferol (VITAMIN D-3) 5000 UNITS TABS Take 1 capsule by mouth as directed. Take one capsule by mouth daily Mon thru Friday  . fexofenadine (ALLEGRA) 180 MG tablet Take 180 mg by mouth daily.  . fluticasone (FLONASE) 50 MCG/ACT nasal spray Place 2 sprays into both nostrils daily.  . hydrochlorothiazide (HYDRODIURIL) 25 MG tablet TAKE (1/2) TABLET DAILY.  . montelukast (SINGULAIR) 10 MG tablet Take 1 tablet (10 mg total) by mouth at bedtime.  Marland Kitchen omega-3 acid ethyl esters (LOVAZA) 1 g capsule TAKE (1) CAPSULE FOUR TIMES DAILY.  Marland Kitchen PARoxetine (PAXIL) 20 MG tablet TAKE 1 AND  1/2 TABLETS AT BEDTIME  . rosuvastatin (CRESTOR) 20 MG tablet Take 1 tablet (20 mg total) by mouth daily.  . traMADol (ULTRAM) 50 MG tablet Take by mouth every 6 (six) hours as needed.     Allergies:   Codeine, Mold extract [trichophyton], Penicillins, Ace inhibitors, Angiotensin receptor blockers, Levaquin [levofloxacin], Vytorin [ezetimibe-simvastatin], and Zetia [ezetimibe]   Social History   Socioeconomic History  . Marital status: Married    Spouse name: [name]  . Number of children: 4  . Years of education: 16  . Highest education level: Bachelor's degree (e.g., BA, AB, BS)  Occupational History  . 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 Financial resource strain: Not hard at all  . Food insecurity    Worry: Never true     Inability: Never true  . Transportation needs    Medical: No    Non-medical: No  Tobacco Use  . Smoking status: Never Smoker  . Smokeless tobacco: Never Used  Substance and Sexual Activity  . Alcohol use: No  . Drug use: No  . Sexual activity: Not Currently    Birth control/protection: None  Lifestyle  . Physical activity    Days per week: 0 days    Minutes per session: 0 min  . Stress: Only a little  Relationships  . Social connections    Talks on phone: More than three times a week    Gets together: More than three times a week    Attends religious service: More than 4 times per year    Active member of club or organization: Yes    Attends meetings of clubs or organizations: More than 4 times per year    Relationship status: Married  Other Topics Concern  . Not on file  Social History Narrative   Lives at home with her husband.   Right-handed.   One cup coffee each morning and one soda each day.     Family History: The patient's family history includes Alzheimer's disease in her father; Colon cancer (age of onset: 70) in her father; Dementia in her mother; Heart disease in her father and maternal grandfather; Heart disease (age of onset: 11) in her brother; Heart murmur in her mother; Hyperlipidemia in her mother and sister; Hypertension in her mother and sister; Kidney disease in her father and mother; Osteoporosis in her mother; Prostate cancer in her father; Rectal cancer (age of onset: 44) in her maternal grandmother. There is no history of Colon polyps, Esophageal cancer, or Stomach cancer.  ROS:   Please see the history of present illness.    All other systems reviewed and are negative.  EKGs/Labs/Other Studies Reviewed:    The following studies were reviewed today: Stress Echo 07-16-2017: Impressions:  - Stress echocardiogram with no chest pain and no ST changes;   hypertensive BP response; no stress-induced wall motion   abnormalities; normal stress  echocardiogram.  EKG:  EKG is ordered today.  The ekg ordered today demonstrates NSR 60 bpm, LVH with repolarization abnormality  Recent Labs: 02/05/2019: ALT 18; BUN 10; Creatinine, Ser 0.72; Hemoglobin 12.6; Platelets 335; Potassium 4.5; Sodium 144; TSH 2.040  Recent Lipid Panel    Component Value Date/Time   CHOL 174 02/05/2019 1126   TRIG 106 02/05/2019 1126   TRIG 116 05/01/2017 0958   HDL 56 02/05/2019 1126   HDL 64 05/01/2017 0958   CHOLHDL 3.1 02/05/2019 1126   LDLCALC 97 02/05/2019 1126   LDLCALC 74 08/19/2014 0920  Physical Exam:    VS:  BP (!) 158/76   Pulse 60   Ht 5\' 8"  (1.727 m)   Wt 217 lb 1.9 oz (98.5 kg)   SpO2 97%   BMI 33.01 kg/m     Wt Readings from Last 3 Encounters:  09/03/19 217 lb 1.9 oz (98.5 kg)  08/05/19 195 lb (88.5 kg)  07/14/19 195 lb (88.5 kg)     GEN:  Well nourished, well developed in no acute distress HEENT: Normal NECK: No JVD; No carotid bruits LYMPHATICS: No lymphadenopathy CARDIAC: RRR, no murmurs, rubs, gallops RESPIRATORY:  Clear to auscultation without rales, wheezing or rhonchi  ABDOMEN: Soft, non-tender, non-distended MUSCULOSKELETAL:  No edema; No deformity  SKIN: Warm and dry NEUROLOGIC:  Alert and oriented x 3 PSYCHIATRIC:  Normal affect   ASSESSMENT:    1. Fatigue, unspecified type   2. Chest pain, unspecified type   3. Snoring   4. Morning headache   5. Bicuspid aortic valve   6. Essential hypertension   7. Mixed hyperlipidemia    PLAN:    In order of problems listed above:  1. Her husband reports that she snores and has pauses in her breathing.  She has a multitude of symptoms that could be related to obstructive sleep apnea.  We will check an overnight sleep study.  We discussed the importance of weight loss. 2. Features include some typical and atypical symptoms.  I have recommended a gated coronary CTA-FFR to evaluate for obstructive CAD in this patient who is at higher risk because of bicuspid aortic  valve, hypertension, hyperlipidemia, and family history. 3. Plan sleep study as above 4. As above 5. I personally reviewed the patient's echo images.  Findings are stable and consistent with only mild aortic stenosis.  We will plan to repeat an echo study next year when I see her back in follow-up. 6. Advised to check home BP twice per week and report readings. She has a hx of white coat HTN 7. Treated with a statin drug. Lifestyle modification discussed.    Medication Adjustments/Labs and Tests Ordered: Current medicines are reviewed at length with the patient today.  Concerns regarding medicines are outlined above.  Orders Placed This Encounter  Procedures  . CT CORONARY MORPH W/CTA COR W/SCORE W/CA W/CM &/OR WO/CM  . CT CORONARY FRACTIONAL FLOW RESERVE DATA PREP  . CT CORONARY FRACTIONAL FLOW RESERVE FLUID ANALYSIS  . Basic metabolic panel  . EKG 12-Lead  . ECHOCARDIOGRAM COMPLETE  . Split night study   No orders of the defined types were placed in this encounter.   Patient Instructions  Medication Instructions:  Your provider recommends that you continue on your current medications as directed. Please refer to the Current Medication list given to you today.    Labwork: TODAY: BMET  Testing/Procedures: Dr. Burt Knack recommends you have a coronary CT (instructions below).  Dr. Burt Knack recommends you have a SLEEP STUDY. This test records several body functions during sleep, including: brain activity, eye movement, oxygen and carbon dioxide blood levels, heart rate and rhythm, breathing rate and rhythm, the flow of air through your mouth and nose, snoring, body muscle movements, and chest and belly movement.  Follow-Up: Valetta Fuller will call you to arrange your 1 year echo and office visit for next summer.    Please check your BLOOD PRESSURE a couple times a week and let us know the results.  Please talk with Dr. Warrick Parisian about your memory issues.  CT INSTRUCTIONS:  Gershon Mussel  [facility] [place], [place] [address] [phone]   Please arrive at the [facility] main entrance of [facility] 30-45 minutes prior to test start time. Proceed to the [facility] Radiology Department (first floor) to check-in and test prep.  On the Night Before the Test: . Be sure to Drink plenty of water. . Do not consume any caffeinated/decaffeinated beverages or chocolate 12 hours prior to your test. . Do not take any antihistamines 12 hours prior to your test.  On the Day of the Test: . Drink plenty of water. Do not drink any water within one hour of the test. . Do not eat any food 4 hours prior to the test. . HOLD Hydrochlorothiazide morning of the test, but you may take everything else as directed. . Please wear underwire-free bra if available      After the Test: . Drink plenty of water. . After receiving IV contrast, you may experience a mild flushed feeling. This is normal. . On occasion, you may experience a mild rash up to 24 hours after the test. This is not dangerous. If this occurs, you can take Benadryl 25 mg and increase your fluid intake. . If you experience trouble breathing, this can be serious. If it is severe call 911 IMMEDIATELY. If it is mild, please call our office.   Please contact the cardiac imaging nurse navigator should you have any questions/concerns Marchia Bond, RN Navigator Cardiac Imaging [facility] [phone] Office  [phone] Cell   Signed, Sherren Mocha, MD  09/03/2019 2:06 PM    [facility]

## 2019-09-03 NOTE — Telephone Encounter (Addendum)
Patient is scheduled for lab study on 10/30.Marland Kitchen Pollyann is scheduled for COVID screening on 10/27 prior to SS.   Patient understands her sleep study will be done at [facility] sleep lab. Patient understands she will receive a sleep packet in a week or so. Patient understands to call if she does not receive the sleep packet in a timely manner.  Left detailed message on voicemail with date and time of titration and informed patient to call back to confirm or reschedule. [phone] OR [phone].

## 2019-09-04 LAB — BASIC METABOLIC PANEL
BUN/Creatinine Ratio: 13 (ref 12–28)
BUN: 9 mg/dL (ref 8–27)
CO2: 24 mmol/L (ref 20–29)
Calcium: 10.2 mg/dL (ref 8.7–10.3)
Chloride: 104 mmol/L (ref 96–106)
Creatinine, Ser: 0.7 mg/dL (ref 0.57–1.00)
GFR calc Af Amer: 102 mL/min/{1.73_m2} (ref >59)
GFR calc non Af Amer: 89 mL/min/{1.73_m2} (ref >59)
Glucose: 81 mg/dL (ref 65–99)
Potassium: 4.3 mmol/L (ref 3.5–5.2)
Sodium: 142 mmol/L (ref 134–144)

## 2019-09-08 NOTE — Telephone Encounter (Signed)
Patient called back to reschedule her appointment. Pt is aware and agreeable to treatment.

## 2019-09-11 ENCOUNTER — Telehealth (HOSPITAL_COMMUNITY): Payer: Self-pay | Admitting: Emergency Medicine

## 2019-09-11 NOTE — Telephone Encounter (Signed)
Reaching out to patient to offer assistance regarding upcoming cardiac imaging study; pt verbalizes understanding of appt date/time, parking situation and where to check in, pre-test NPO status and medications ordered, and verified current allergies; name and call back number provided for further questions should they arise Morad Tal RN Navigator Cardiac Imaging [place] [facility] [phone] office [phone] cell 

## 2019-09-15 ENCOUNTER — Ambulatory Visit (HOSPITAL_COMMUNITY)
Admission: RE | Admit: 2019-09-15 | Discharge: 2019-09-15 | Disposition: A | Discharge status: Home / Self Care | Payer: Medicare Other | Source: Ambulatory Visit | Attending: Cardiovascular Disease | Admitting: Cardiovascular Disease

## 2019-09-15 ENCOUNTER — Ambulatory Visit (INDEPENDENT_AMBULATORY_CARE_PROVIDER_SITE_OTHER): Payer: Medicare Other | Admitting: Internal Medicine

## 2019-09-15 ENCOUNTER — Telehealth: Payer: Self-pay

## 2019-09-15 ENCOUNTER — Other Ambulatory Visit: Payer: Self-pay

## 2019-09-15 ENCOUNTER — Encounter: Payer: Self-pay | Admitting: Internal Medicine

## 2019-09-15 VITALS — BP 128/80 | HR 64 | Temp 98.4°F | Ht 68.0 in | Wt 215.2 lb

## 2019-09-15 DIAGNOSIS — I2584 Coronary atherosclerosis due to calcified coronary lesion: Secondary | ICD-10-CM | POA: Insufficient documentation

## 2019-09-15 DIAGNOSIS — I700 Atherosclerosis of aorta: Secondary | ICD-10-CM

## 2019-09-15 DIAGNOSIS — R159 Full incontinence of feces: Secondary | ICD-10-CM

## 2019-09-15 DIAGNOSIS — N8110 Cystocele, unspecified: Secondary | ICD-10-CM | POA: Diagnosis not present

## 2019-09-15 DIAGNOSIS — R32 Unspecified urinary incontinence: Secondary | ICD-10-CM | POA: Diagnosis not present

## 2019-09-15 DIAGNOSIS — M48061 Spinal stenosis, lumbar region without neurogenic claudication: Secondary | ICD-10-CM | POA: Diagnosis not present

## 2019-09-15 DIAGNOSIS — K582 Mixed irritable bowel syndrome: Secondary | ICD-10-CM | POA: Diagnosis not present

## 2019-09-15 DIAGNOSIS — R079 Chest pain, unspecified: Secondary | ICD-10-CM | POA: Insufficient documentation

## 2019-09-15 DIAGNOSIS — I2510 Atherosclerotic heart disease of native coronary artery without angina pectoris: Secondary | ICD-10-CM | POA: Insufficient documentation

## 2019-09-15 DIAGNOSIS — N816 Rectocele: Secondary | ICD-10-CM

## 2019-09-15 MED ORDER — NITROGLYCERIN 0.4 MG SL SUBL
SUBLINGUAL_TABLET | SUBLINGUAL | Status: AC
  Filled 2019-09-15: qty 2

## 2019-09-15 MED ORDER — NITROGLYCERIN 0.4 MG SL SUBL
0.8000 mg | SUBLINGUAL_TABLET | Freq: Once | SUBLINGUAL | Status: AC
  Administered 2019-09-15: 13:00:00 0.8 mg via SUBLINGUAL
  Filled 2019-09-15: qty 25

## 2019-09-15 MED ORDER — IOHEXOL 350 MG/ML SOLN
80.0000 mL | Freq: Once | INTRAVENOUS | Status: AC | PRN
  Administered 2019-09-15: 13:00:00 80 mL via INTRAVENOUS

## 2019-09-15 NOTE — Telephone Encounter (Signed)
Shelly Bennett was seen today and had to leave before we got her appointments booked for pelvic floor PT and also a MRI defecography. I have left her a detailed voice mail that these orders have been placed and she will be getting calls to set these things up.

## 2019-09-15 NOTE — Assessment & Plan Note (Signed)
Pelvic floor physical therapy and MR defecography

## 2019-09-15 NOTE — Patient Instructions (Addendum)
We will arrange for the MRI and the pelvic floor PT.   Will call you later about all of this.  I appreciate the opportunity to care for you. Gatha Mayer, MD, [facility]

## 2019-09-15 NOTE — Progress Notes (Signed)
Shelly Bennett 68 y.o. [birth year] [id number]  Assessment & Plan:   Urinary and fecal incontinence I think she has pelvic floor weakness most likely.  Given the history of cystocele organ prolapse may be part of this problem.  She does not seem to have rectal prolapse but there is excessive descent on simulated defecation part of the rectal exam.  My plan is for pelvic floor physical therapy and MR Defecography.  I do not think this is related to her lumbar spine issues.  However I do not know but based upon my exam and history and available information I doubt it.  She does need follow-up on that I think.  Spinal stenosis of lumbar region without neurogenic claudication When she goes for pelvic floor physical therapy I think she can get some help with this as well.  IBS (irritable bowel syndrome) - with chronic recurrent abdominal pain Pelvic floor physical therapy.  Also want her to take a fiber supplement.  Benefiber 1 tablespoon nightly.  Await MRdefecography  Cystocele with rectocele Pelvic floor physical therapy and MR defecography   I appreciate the opportunity to care for this patient. CC: Dettinger, Fransisca Kaufmann, MD   Subjective:   Chief Complaint:  HPI Shelly Bennett is here because she told me about fecal and urinary incontinence and a discussion after her recent colonoscopy was she had 4 small adenomas.  In the past 6 months she is noticing an increase in urgent defecation and incontinence at times.  Stools vary from formed to loose.  Nothing to suggest he had a rehab.  She also has chronic urinary urge incontinence as well as stress and non-stress incontinence.  Dr. Ulanda Edison has diagnosed her with a cystocele in the past.  She does struggle with IBS over the years.  Her diet consists of some milk just with cereal in the mornings but no noted lactose intolerance.  Maybe a cup of coffee a day she does eat whole grains fiber salads.  Sometimes peanut butter cups with crackers when  she is not that hungry.  She cannot correlate problems with food intake necessarily.  She is also having some back issues and weakness in her right thigh, she had been going to physical therapy but that got disrupted by COVID.  She had acute L1 and L2 compression fractures and mildly progressive lumbar disc degeneration L3-4 where there is now severe spinal stenosis on MRI L-spine December 2019.  The weakness is focal there is no sciatica component to it that I can tell.  She has been falling, some of that is related to dreams and movement in the bed and she is actually fallen out of the bed and she did that and struck her right lateral hip area the other night, she has a sleep study pending.  There is been memory disturbance as well and her daughter has been concerned though her B12 and thyroid testing are normal.  Some balance issues are reported also. She saw neurology for mild cognitive impairment in 2018 Allergies  Allergen Reactions  . Codeine     ? Reaction ER visit.   . Mold Extract [Trichophyton]     Migraine  . Penicillins Other (See Comments)    "drew my legs up as child" Pt has tolerated cephalexin in the past.  . Ace Inhibitors Cough  . Angiotensin Receptor Blockers Cough  . Levaquin [Levofloxacin] Rash    Per notes from outpatient provider  . Vytorin [Ezetimibe-Simvastatin] Other (See Comments)    cramping  .  Zetia [Ezetimibe] Other (See Comments)    cramping   Current Meds  Medication Sig  . amLODipine (NORVASC) 10 MG tablet TAKE (1/2) TABLET DAILY.  Marland Kitchen aspirin 81 MG tablet Take 81 mg by mouth daily.   . calcium carbonate 200 MG capsule Take 250 mg by mouth 2 (two) times daily.   . Cholecalciferol (VITAMIN D-3) 5000 UNITS TABS Take 1 capsule by mouth as directed. Take one capsule by mouth daily Mon thru Friday  . fexofenadine (ALLEGRA) 180 MG tablet Take 180 mg by mouth daily.  . fluticasone (FLONASE) 50 MCG/ACT nasal spray Place 2 sprays into both nostrils daily.  .  hydrochlorothiazide (HYDRODIURIL) 25 MG tablet TAKE (1/2) TABLET DAILY.  . montelukast (SINGULAIR) 10 MG tablet Take 1 tablet (10 mg total) by mouth at bedtime.  Marland Kitchen omega-3 acid ethyl esters (LOVAZA) 1 g capsule TAKE (1) CAPSULE FOUR TIMES DAILY.  Marland Kitchen PARoxetine (PAXIL) 20 MG tablet TAKE 1 AND 1/2 TABLETS AT BEDTIME  . rosuvastatin (CRESTOR) 20 MG tablet Take 1 tablet (20 mg total) by mouth daily.  . traMADol (ULTRAM) 50 MG tablet Take by mouth every 6 (six) hours as needed.   Past Medical History:  Diagnosis Date  . Adenomatous colon polyp   . Allergic drug reaction 06/25/2015  . Allergy   . Anxiety   . Arthritis   . At risk for falls    fallen 4 times recently   . CAD (coronary artery disease)    Dr. Burt Knack follows   . Carpal tunnel syndrome   . Chronic headaches   . Coccydynia 02/01/2012  . Cystocele   . Diverticulosis   . External hemorrhoids   . Focal nodular hyperplasia of liver   . GERD (gastroesophageal reflux disease)    some  . HCAP (healthcare-associated pneumonia) 06/25/2015  . Heart murmur   . HLD (hyperlipidemia)   . HTN (hypertension)   . IBS (irritable bowel syndrome)   . Memory loss   . Neuromuscular disorder ([place])    some neuropathy issues in the past- legs fibromyalgia in past-- past hx of steroid injections  . Obesity   . Osteoporosis   . Otitis of left ear 02/01/2017  . Pneumonia   . Skin cancer    squamous cell - face   Past Surgical History:  Procedure Laterality Date  . CARPAL TUNNEL RELEASE    . COLONOSCOPY  12/15/2008   diverticulosis, external hemorrhoids  . KNEE SURGERY  05/22/2015   right  . LAPAROSCOPY    . SKIN SURGERY     squamous cell - right face    Social History   Social History Narrative   Lives at home with her husband.   Right-handed.   One cup coffee each morning and one soda each day.   family history includes Alzheimer's disease in her father; Colon cancer (age of onset: 82) in her father; Dementia in her mother; Heart  disease in her father and maternal grandfather; Heart disease (age of onset: 46) in her brother; Heart murmur in her mother; Hyperlipidemia in her mother and sister; Hypertension in her mother and sister; Kidney disease in her father and mother; Osteoporosis in her mother; Prostate cancer in her father; Rectal cancer (age of onset: 28) in her maternal grandmother.   Review of Systems As per HPI  Objective:   Physical Exam BP 128/80   Pulse 64   Temp 98.4 F (36.9 C) (Oral)   Ht 5\' 8"  (1.727 m)   Wt 215  lb 3.2 oz (97.6 kg)   BMI 32.72 kg/m  Obese white woman no acute distress Appropriate mood and affect  Neuro exam shows mild R hip flexor weakness but otherwise symmetrical and normal strength in both lower extremities Bruise right lat buttock  Patti [place], [place] present.  Rectal exam shows a normal anoderm with some small tags otherwise, a very small rectocele is thought to be appreciated, she has weak resting and voluntary anal squeeze.  Simulated defecation shows appropriate but reduced abdominal contraction and what I think is some excessive descent of the rectum.

## 2019-09-15 NOTE — Assessment & Plan Note (Signed)
Pelvic floor physical therapy.  Also want her to take a fiber supplement.  Benefiber 1 tablespoon nightly.  Await MRdefecography

## 2019-09-15 NOTE — Assessment & Plan Note (Signed)
I think she has pelvic floor weakness most likely.  Given the history of cystocele organ prolapse may be part of this problem.  She does not seem to have rectal prolapse but there is excessive descent on simulated defecation part of the rectal exam.  My plan is for pelvic floor physical therapy and MR Defecography.  I do not think this is related to her lumbar spine issues.  However I do not know but based upon my exam and history and available information I doubt it.  She does need follow-up on that I think.

## 2019-09-15 NOTE — Assessment & Plan Note (Signed)
When she goes for pelvic floor physical therapy I think she can get some help with this as well.

## 2019-09-16 NOTE — Telephone Encounter (Signed)
I spoke with [facility] and they will call the patient and get her MRI set up.

## 2019-09-22 ENCOUNTER — Telehealth: Payer: Self-pay | Admitting: Internal Medicine

## 2019-09-22 NOTE — Telephone Encounter (Signed)
Questions answered and she will call them tomorrow if she doesn't hear back from them first. They called her today to set this up and she wanted to talk with [place] first.

## 2019-09-22 NOTE — Telephone Encounter (Signed)
Pt would like to speak with you about her MRI appointment.

## 2019-09-23 NOTE — Progress Notes (Signed)
Shelly Bennett [facility] - 68 y.o. female MRN [id number]  Date of birth: 1951/06/25  Office Visit Note: Visit Date: 12/18/2018 PCP: Dettinger, Fransisca Kaufmann, MD Referred by: Chipper Herb, MD  Subjective: Chief Complaint  Patient presents with   Lower Back - Pain   Right Thigh - Pain   Left Thigh - Pain   HPI: Shelly Bennett is a 68 y.o. female who comes in today For evaluation at the request of Dr. Claretta Fraise for evaluation management of worsening low back pain status post fall from a bed.  It appears that she was initially referred to Dr. Suella Broad but because she is followed by Dr. Joni Fears I have actually seen the patient on one occasion in 2018 for epidural injection she comes in today for evaluation.  Her history is such that it on November 17, 2018 she was having a pretty vivid dream evidently and fell from her bed about 30 inches to the hardwood floor.  She had thorough work-up of the spine including MRI at that time which is reviewed below.  This shows small 15% compression fracture at L1 and L2.  There is no retropulsion.  Patient does have a history and still evident severe stenosis at L3-4.  She is reporting severe lower back pain and sometimes referral pattern into both thighs.  She reports moving in general seems to make it hurt worse but particular going from sit to stand or with standing.  She rates her pain as a 6 out of 10 and it is constant dull and aching.  She has no paresthesias down the legs.  She has been followed in the past by neurology for memory issues as well as radicular pain.  She has been followed from a orthopedic standpoint by Dr. Joni Fears and Biagio Borg, PA-C.  She is not noted any focal weakness or new issues that are red flag complaints such as bowel or bladder changes or fevers chills or night sweats or weight loss unintended.  She has had no prior spine surgery or compression fractures or vertebroplasty.  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  She has been using Tylenol for pain relief and helps some.  She does feel like it is gotten a little bit better.  Review of Systems  Constitutional: Negative for chills, fever, malaise/fatigue and weight loss.  HENT: Negative for hearing loss and sinus pain.   Eyes: Negative for blurred vision, double vision and photophobia.  Respiratory: Negative for cough and shortness of breath.   Cardiovascular: Negative for chest pain, palpitations and leg swelling.  Gastrointestinal: Negative for abdominal pain, nausea and vomiting.  Genitourinary: Negative for flank pain.  Musculoskeletal: Positive for back pain. Negative for myalgias.  Skin: Negative for itching and rash.  Neurological: Negative for tremors, focal weakness and weakness.  Endo/Heme/Allergies: Negative.   Psychiatric/Behavioral: Negative for depression.  All other systems reviewed and are negative.  Otherwise per HPI.  Assessment & Plan: Visit Diagnoses:  1. Closed compression fracture of body of L1 vertebra (HCC)   2. Closed compression fracture of L2 lumbar vertebra, initial encounter ([place])   3. Spinal stenosis of lumbar region without neurogenic claudication   4. Spondylosis without myelopathy or radiculopathy, lumbar region   5. Chronic bilateral low back pain without sciatica     Plan: Findings:  Chronic history of back pain and radicular pain from severe stenosis particular at L3-4 less so at L4-5.  Now with close compression fractures  pretty small at L1 and L2 without retropulsion or canal narrowing.  Symptoms seem to be related mostly to the compression fracture itself.  We had a long talk today over 20 minutes particularly about compression fractures and the treatment.  No compression fracture is ever continued to hurt [place] heel is just a matter of how long it will take.  We talked about standard of care with medication management perhaps bracing and physical therapy.  We did prescribe a  trial of tramadol to see if that would give her more pain relief than the Tylenol.  We did review her controlled substance database.  We have talked about activity modification which she is going to do.  Depending on longevity of the pain would look at physical therapy and bracing.  She could consider kyphoplasty but is a very small fracture without much height loss and there seems to be no change in alignment.    Meds & Orders:  Meds ordered this encounter  Medications   traMADol (ULTRAM) 50 MG tablet    Sig: Take 1 tablet (50 mg total) by mouth every 6 (six) hours as needed for up to 5 days for severe pain.    Dispense:  15 tablet    Refill:  0   No orders of the defined types were placed in this encounter.   Follow-up: Return in about 4 weeks (around 01/15/2019).   Procedures: No procedures performed  No notes on file   Clinical History: MRI LUMBAR SPINE WITHOUT CONTRAST  TECHNIQUE: Multiplanar, multisequence MR imaging of the lumbar spine was performed. No intravenous contrast was administered.  COMPARISON:  12/15/2015  FINDINGS: The study is mildly motion degraded.  Segmentation: Standard.  Alignment: Mild chronic straightening of the normal lumbar lordosis. No listhesis.  Vertebrae: There are L1 and L2 superior endplate compression fractures, each with approximately 15% vertebral body height loss and moderate marrow edema. There is no retropulsion. Mild edema along the L4 inferior endplate is likely degenerative. Scattered small Schmorl's nodes are noted. No suspicious osseous lesion is identified.  Conus medullaris and cauda equina: Conus extends to the L1 level. Conus and cauda equina appear normal.  Paraspinal and other soft tissues: 2.4 cm T2 hyperintense lesion in the right kidney, slightly enlarged from prior and compatible with a cyst.  Disc levels:  Disc desiccation throughout the lumbar spine. Mild disc space narrowing at L3-4. 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  T11-12: Mild disc bulging and facet arthrosis without significant stenosis, unchanged.  T12-L1: Minimal disc bulging and moderate right and mild left facet arthrosis without stenosis, unchanged.  L1-2: Circumferential disc bulging, small central disc protrusion, and moderate facet and ligamentum flavum hypertrophy result in mild spinal stenosis without neural foraminal stenosis, unchanged.  L2-3: Circumferential disc bulging and moderate facet and ligamentum flavum hypertrophy result in progressive, moderate spinal stenosis and mild bilateral lateral recess stenosis without significant neural foraminal stenosis.  L3-4: Circumferential disc bulging and moderate facet and ligamentum flavum hypertrophy result in progressive, severe spinal stenosis and mild left greater than right lateral recess stenosis without significant neural foraminal stenosis.  L4-5: Disc bulging and mild-to-moderate facet and ligamentum flavum hypertrophy result in mild spinal stenosis, mild-to-moderate left greater than right lateral recess stenosis, and mild bilateral neural foraminal stenosis, stable to slightly progressed. Unchanged left foraminal annular fissure.  L5-S1: Minimal disc bulging, new or more conspicuous left foraminal disc protrusion, and mild facet and ligamentum flavum hypertrophy result in mild left and  borderline right lateral recess stenosis and mild left neural foraminal stenosis, slightly progressed. No spinal stenosis.  IMPRESSION: 1. Acute, mild L1 and L2 compression fractures. 2. Mildly progressive lumbar disc degeneration, most notable at L3-4 where there is now severe spinal stenosis.   Electronically Signed   By: Logan Bores M.D.   On: 11/25/2018 11:54   She reports that she has never smoked. She has never used smokeless tobacco. No results for input(s): HGBA1C, LABURIC in the last 8760  hours.  Objective:  VS:  HT:5\' 9"  (175.3 cm)    WT:195 lb (88.5 kg)   BMI:28.78     BP:(!) 164/73   HR:(!) 57bpm   TEMP: ( )   RESP:100 % Physical Exam Vitals signs and nursing note reviewed.  Constitutional:      General: She is not in acute distress.    Appearance: Normal appearance. She is well-developed.  HENT:     Head: Normocephalic and atraumatic.     Nose: Nose normal.     Mouth/Throat:     Mouth: Mucous membranes are moist.     Pharynx: Oropharynx is clear.  Eyes:     Conjunctiva/sclera: Conjunctivae normal.     Pupils: Pupils are equal, round, and reactive to light.  Neck:     Musculoskeletal: Normal range of motion and neck supple.  Cardiovascular:     Rate and Rhythm: Regular rhythm.  Pulmonary:     Effort: Pulmonary effort is normal. No respiratory distress.  Abdominal:     General: There is no distension.     Palpations: Abdomen is soft.     Tenderness: There is no guarding.  Musculoskeletal:     Right lower leg: No edema.     Left lower leg: No edema.     Comments: Pain with rocking over the upper lumbar vertebral bodies.  No myofascial trigger points.  She does have pain going from sit to stand and has difficulty in full extension and facet loading.  She has no pain over the greater trochanters she has no pain with hip rotation she has good distal strength without clonus.  Skin:    General: Skin is warm and dry.     Findings: No erythema or rash.  Neurological:     General: No focal deficit present.     Mental Status: She is alert and oriented to person, place, and time.     Motor: No abnormal muscle tone.     Coordination: Coordination normal.     Gait: Gait normal.  Psychiatric:        Mood and Affect: Mood normal.        Behavior: Behavior normal.        Thought Content: Thought content normal.     Ortho Exam Imaging: No results found.  Past Medical/Family/Surgical/Social History: Medications & Allergies reviewed per EMR, new medications  updated. Patient Active Problem List   Diagnosis Date Noted   Spinal stenosis of lumbar region without neurogenic claudication 09/15/2019   Urinary and fecal incontinence 09/15/2019   Cystocele with rectocele 09/15/2019   Radial styloid tenosynovitis (de quervain) 06/24/2019   Trigger finger, right middle finger 06/24/2019   Pain in left wrist 05/27/2019   Trigger thumb, right thumb 10/29/2018   Memory loss 07/22/2018   Low back pain 07/22/2018   Left lumbar radiculopathy 01/22/2018   Mild cognitive impairment 10/22/2017   Anxiety 10/22/2017   Aortic atherosclerosis ([place]) 12/04/2016   Vitamin D deficiency 08/19/2014   Family history  of colon cancer-father at 58+ grandparent w/ rectal cancer 01/14/2014   Osteopenia 08/12/2013   CAD (coronary artery disease), native coronary artery 03/22/2013   Aortic valve stenosis, mild 03/22/2013   Hyperlipemia 03/11/2013   Hypertension 03/11/2013   Allergic rhinitis 03/11/2013   Left shoulder pain 03/11/2013   IBS (irritable bowel syndrome) - with chronic recurrent abdominal pain 02/01/2012   Personal history of colonic polyps - adenoma 02/01/2012   Past Medical History:  Diagnosis Date   Adenomatous colon polyp    Allergic drug reaction 06/25/2015   Allergy    Anxiety    Arthritis    At risk for falls    fallen 4 times recently    CAD (coronary artery disease)    Dr. Burt Knack follows    Carpal tunnel syndrome    Chronic headaches    Coccydynia 02/01/2012   Cystocele    Diverticulosis    External hemorrhoids    Focal nodular hyperplasia of liver    GERD (gastroesophageal reflux disease)    some   HCAP (healthcare-associated pneumonia) 06/25/2015   Heart murmur    HLD (hyperlipidemia)    HTN (hypertension)    IBS (irritable bowel syndrome)    Memory loss    Neuromuscular disorder (HCC)    some neuropathy issues in the past- legs fibromyalgia in past-- past hx of steroid injections    Obesity    Osteoporosis    Otitis of left ear 02/01/2017   Pneumonia    Skin cancer    squamous cell - face   Family History  Problem Relation Age of Onset   Prostate cancer Father    Colon cancer Father 23   Kidney disease Father    Heart disease Father    Alzheimer's disease Father    Hyperlipidemia Mother    Hypertension Mother    Kidney disease Mother    Osteoporosis Mother    Heart murmur Mother    Dementia Mother    Hyperlipidemia Sister    Hypertension Sister    Heart disease Brother 55       not dx questionable    Heart disease Maternal Grandfather    Rectal cancer Maternal Grandmother 42   Colon polyps Neg Hx    Esophageal cancer Neg Hx    Stomach cancer Neg Hx    Past Surgical History:  Procedure Laterality Date   CARPAL TUNNEL RELEASE     COLONOSCOPY  12/15/2008   diverticulosis, external hemorrhoids   KNEE SURGERY  05/22/2015   right   LAPAROSCOPY     SKIN SURGERY     squamous cell - right face    Social History   Occupational History   Occupation: Pharmacist, hospital retired  Tobacco Use   Smoking status: Never Smoker   Smokeless tobacco: Never Used  Substance and Sexual Activity   Alcohol use: No   Drug use: No   Sexual activity: Not Currently    Birth control/protection: None

## 2019-09-24 ENCOUNTER — Other Ambulatory Visit: Payer: Self-pay

## 2019-09-24 ENCOUNTER — Ambulatory Visit: Payer: Medicare Other | Attending: Internal Medicine | Admitting: Physical Therapy

## 2019-09-24 ENCOUNTER — Encounter: Payer: Self-pay | Admitting: Physical Therapy

## 2019-09-24 DIAGNOSIS — R2681 Unsteadiness on feet: Secondary | ICD-10-CM | POA: Diagnosis not present

## 2019-09-24 DIAGNOSIS — R279 Unspecified lack of coordination: Secondary | ICD-10-CM | POA: Insufficient documentation

## 2019-09-24 DIAGNOSIS — M6281 Muscle weakness (generalized): Secondary | ICD-10-CM | POA: Insufficient documentation

## 2019-09-24 NOTE — Therapy (Addendum)
[facility] [address] [place], [place], [address] Phone: [phone]   Fax:  [phone]  Physical Therapy Evaluation  Patient Details  Name: Shelly Bennett MRN: [id number] Date of Birth: [birth year] Referring Provider (PT): Gatha Mayer, MD   Encounter Date: 09/24/2019  PT End of Session - 09/27/19 1553    Visit Number  1    Date for PT Re-Evaluation  12/17/19    Authorization Type  [employer] medicare    PT Start Time  1220    PT Stop Time  1306    PT Time Calculation (min)  46 min    Activity Tolerance  Patient tolerated treatment well       Past Medical History:  Diagnosis Date  . Adenomatous colon polyp   . Allergic drug reaction 06/25/2015  . Allergy   . Anxiety   . Arthritis   . At risk for falls    fallen 4 times recently   . CAD (coronary artery disease)    Dr. Burt Knack follows   . Carpal tunnel syndrome   . Chronic headaches   . Coccydynia 02/01/2012  . Cystocele   . Diverticulosis   . External hemorrhoids   . Focal nodular hyperplasia of liver   . GERD (gastroesophageal reflux disease)    some  . HCAP (healthcare-associated pneumonia) 06/25/2015  . Heart murmur   . HLD (hyperlipidemia)   . HTN (hypertension)   . IBS (irritable bowel syndrome)   . Memory loss   . Neuromuscular disorder ([place])    some neuropathy issues in the past- legs fibromyalgia in past-- past hx of steroid injections  . Obesity   . Osteoporosis   . Otitis of left ear 02/01/2017  . Pneumonia   . Skin cancer    squamous cell - face    Past Surgical History:  Procedure Laterality Date  . CARPAL TUNNEL RELEASE    . COLONOSCOPY  12/15/2008   diverticulosis, external hemorrhoids  . KNEE SURGERY  05/22/2015   right  . LAPAROSCOPY    . SKIN SURGERY     squamous cell - right face     There were no vitals filed for this visit.   Subjective Assessment - 09/27/19 1542    Subjective  Pt reports she has issues with  her back and Rt LE weakness and fallen 8x. States she has leakage that happens mostly with an urge and when coughing or sneezing. Pt has had back pain and falls for 6 months    Pertinent History  IBS, neuropathy    Patient Stated Goals  get leakage under control and exercises to strengthen back and improve balance    Currently in Pain?  No/denies         [facility] PT Assessment - 09/27/19 0001      Assessment   Medical Diagnosis  R32,R15.9 (ICD-10-CM) - Urinary and fecal incontinence; N81.10,N81.6 (ICD-10-CM) - Cystocele with rectocele    Referring Provider (PT)  Gatha Mayer, MD      Precautions   Precautions  None      Restrictions   Weight Bearing Restrictions  No      Balance Screen   Has the patient fallen in the past 6 months  Yes    How many times?  8    Has the patient had a decrease in activity level because of a fear of falling?   Yes    Is the patient reluctant to leave  their home because of a fear of falling?   No      Home Social worker  Private residence    Living Arrangements  Spouse/significant other      Prior Function   Level of Independence  Independent      Cognition   Overall Cognitive Status  Within Functional Limits for tasks assessed      Posture/Postural Control   Posture/Postural Control  Postural limitations    Postural Limitations  Rounded Shoulders;Increased thoracic kyphosis      ROM / Strength   AROM / PROM / Strength  Strength      Strength   Overall Strength Comments  Rt LE grossly 4-/5 and Lt is 4+/5      Flexibility   Soft Tissue Assessment /Muscle Length  yes    Hamstrings  80%      Palpation   Palpation comment  TTP lumbar abdominal attachment to pubic rami and symphasis, muscle atrophy of lower abdominals      Transfers   Five time sit to stand comments   needs to push on thighs      Ambulation/Gait   Gait Pattern  Decreased stride length;Trunk flexed      Standardized Balance Assessment   Standardized  Balance Assessment  Five Times Sit to Stand    Five times sit to stand comments   32                Objective measurements completed on examination: See above findings.    Pelvic Floor Special Questions - 09/27/19 0001    Prior Pelvic/Prostate Exam  Yes    Prior Pregnancies  Yes    Number of Pregnancies  4    Number of Vaginal Deliveries  4    Any difficulty with labor and deliveries  No    Currently Sexually Active  Yes    Is this Painful  No    Urinary Leakage  Yes    How often  every time I urinate    Pad use  don't use    Activities that cause leaking  With strong urge    Urinary urgency  Yes    Fecal incontinence  Yes    Falling out feeling (prolapse)  Yes    Activities that cause feeling of prolapse  can feel it more when going to the bathroom or intercourse    Skin Integrity  Intact    Perineal Body/Introitus   Descended    Prolapse  Anterior Wall;Posterior Wall    Pelvic Floor Internal Exam  pt identity confirmed and informed consent given to perform internal soft tissue assess and treat    Exam Type  Vaginal    Strength  weak squeeze, no lift    Strength # of reps  1    Strength # of seconds  4    Tone  low                 PT Short Term Goals - 09/27/19 1549      PT SHORT TERM GOAL #1   Title  Pt will report 25% improved pain    Time  4    Period  Weeks    Status  New    Target Date  10/22/19      PT SHORT TERM GOAL #2   Title  Pt will report she is able to hold bowel and bladder 2x longer in order to get to the bathroom  with less leakage    Time  4    Period  Weeks    Status  New    Target Date  10/22/19        PT Long Term Goals - 09/27/19 1546      PT LONG TERM GOAL #1   Title  Ind with HEP.    Time  8    Period  Weeks    Status  New    Target Date  11/19/19      PT LONG TERM GOAL #2   Title  5 x sit to stand <22 seconds for reduced risk of falls    Time  8    Period  Weeks    Status  New    Target Date  11/19/19       PT LONG TERM GOAL #3   Title  LE weakness of at least 4+/5 for imporved stability    Time  8    Period  Weeks    Status  New    Target Date  11/19/19      PT LONG TERM GOAL #4   Title  Leakage reduced by at least 50%    Time  8    Period  Weeks    Status  New    Target Date  11/19/19      PT LONG TERM GOAL #5   Title  Reports at least 50% improvement in abdominal and low back pain due to pelvic floor strength    Time  8    Period  Weeks    Status  New    Target Date  11/19/19             Plan - 09/27/19 1554    Clinical Impression Statement  Pt presents to clinic with primary complaint of urinary and fecal incontinence. Pt also has chronic low back pain and frequent falls that have been exacerbating the low back pain. Pt has cystocele and rectocele. She demonstrates 2/5 MMT of pelvic floor and can hold for 4 seconds for 1 rep max. She is unable to coordinate breathing and pelvic floor contraction and can only contract with breath hold at this time. Pt has LE weakness of Rt>Lt. Rt LE grossly 4-/5 and Lt is 4+/5. Pt has high increased risk of falls demonstrated by 32 seconds for 5x sit to stand where <12 seconds is normal for her age. Pt demonstrates gait abnormalities as metioned above. Pt will benefit from skilled PT to address these impairments and return to maximum functional activities with reduced risk of falling.    Personal Factors and Comorbidities  Age;Comorbidity 2    Comorbidities  bilateral knee surgeries, chronic back pain with sciatica and LE weakness    Examination-Activity Limitations  Squat;Locomotion Level;Stand;Toileting;Continence    Examination-Participation Restrictions  Community Activity    Stability/Clinical Decision Making  Unstable/Unpredictable    Clinical Decision Making  High    Rehab Potential  Good    PT Frequency  2x / week    PT Duration  12 weeks    PT Treatment/Interventions  ADLs/Self Care Home  Management;Biofeedback;Cryotherapy;Software engineer;Therapeutic activities;Therapeutic exercise;Balance training;Neuromuscular re-education;Patient/family education;Manual techniques;Passive range of motion;Dry needling;Taping    PT Next Visit Plan  biofeedback    Consulted and Agree with Plan of Care  Patient       Patient will benefit from skilled therapeutic intervention in order to improve the following deficits and impairments:  Abnormal gait, Pain, Postural dysfunction,  Decreased strength, Decreased coordination, Impaired tone, Decreased range of motion, Decreased balance  Visit Diagnosis: Unsteadiness on feet - Plan: PT plan of care cert/re-cert  Muscle weakness (generalized) - Plan: PT plan of care cert/re-cert  Unspecified lack of coordination - Plan: PT plan of care cert/re-cert     Problem List Patient Active Problem List   Diagnosis Date Noted  . Spinal stenosis of lumbar region without neurogenic claudication 09/15/2019  . Urinary and fecal incontinence 09/15/2019  . Cystocele with rectocele 09/15/2019  . Radial styloid tenosynovitis (de quervain) 06/24/2019  . Trigger finger, right middle finger 06/24/2019  . Pain in left wrist 05/27/2019  . Trigger thumb, right thumb 10/29/2018  . Memory loss 07/22/2018  . Low back pain 07/22/2018  . Left lumbar radiculopathy 01/22/2018  . Mild cognitive impairment 10/22/2017  . Anxiety 10/22/2017  . Aortic atherosclerosis ([place]) 12/04/2016  . Vitamin D deficiency 08/19/2014  . Family history of colon cancer-father at 2+ grandparent w/ rectal cancer 01/14/2014  . Osteopenia 08/12/2013  . CAD (coronary artery disease), native coronary artery 03/22/2013  . Aortic valve stenosis, mild 03/22/2013  . Hyperlipemia 03/11/2013  . Hypertension 03/11/2013  . Allergic rhinitis 03/11/2013  . Left shoulder pain 03/11/2013  . IBS (irritable bowel syndrome) - with chronic recurrent abdominal pain 02/01/2012   . Personal history of colonic polyps - adenoma 02/01/2012    Jule Ser, PT 09/27/2019, 3:59 PM  [facility] [address] [place], [place], [address] Phone: [phone]   Fax:  [phone]  Name: Jayde Erwin MRN: [id number] Date of Birth: [birth year]

## 2019-09-30 ENCOUNTER — Other Ambulatory Visit: Payer: Self-pay

## 2019-09-30 ENCOUNTER — Ambulatory Visit: Payer: Medicare Other | Admitting: Physical Therapy

## 2019-09-30 ENCOUNTER — Encounter

## 2019-09-30 DIAGNOSIS — R2681 Unsteadiness on feet: Secondary | ICD-10-CM | POA: Diagnosis not present

## 2019-09-30 DIAGNOSIS — M6281 Muscle weakness (generalized): Secondary | ICD-10-CM

## 2019-09-30 DIAGNOSIS — R279 Unspecified lack of coordination: Secondary | ICD-10-CM

## 2019-09-30 NOTE — Therapy (Signed)
[facility] [address] [place], [place], [address] Phone: [phone]   Fax:  [phone]  Physical Therapy Treatment  Patient Details  Name: Shelly Bennett MRN: [id number] Date of Birth: 1951-04-30 Referring Provider (PT): Gatha Mayer, MD   Encounter Date: 09/30/2019  PT End of Session - 09/30/19 1701    Visit Number  2    Date for PT Re-Evaluation  12/17/19    Authorization Type  [employer] medicare    PT Start Time  1616    PT Stop Time  1657    PT Time Calculation (min)  41 min    Activity Tolerance  Patient tolerated treatment well    Behavior During Therapy  [facility] for tasks assessed/performed       Past Medical History:  Diagnosis Date  . Adenomatous colon polyp   . Allergic drug reaction 06/25/2015  . Allergy   . Anxiety   . Arthritis   . At risk for falls    fallen 4 times recently   . CAD (coronary artery disease)    Dr. Burt Knack follows   . Carpal tunnel syndrome   . Chronic headaches   . Coccydynia 02/01/2012  . Cystocele   . Diverticulosis   . External hemorrhoids   . Focal nodular hyperplasia of liver   . GERD (gastroesophageal reflux disease)    some  . HCAP (healthcare-associated pneumonia) 06/25/2015  . Heart murmur   . HLD (hyperlipidemia)   . HTN (hypertension)   . IBS (irritable bowel syndrome)   . Memory loss   . Neuromuscular disorder ([place])    some neuropathy issues in the past- legs fibromyalgia in past-- past hx of steroid injections  . Obesity   . Osteoporosis   . Otitis of left ear 02/01/2017  . Pneumonia   . Skin cancer    squamous cell - face    Past Surgical History:  Procedure Laterality Date  . CARPAL TUNNEL RELEASE    . COLONOSCOPY  12/15/2008   diverticulosis, external hemorrhoids  . KNEE SURGERY  05/22/2015   right  . LAPAROSCOPY    . SKIN SURGERY     squamous cell - right face     There were no vitals filed for this visit.  Subjective Assessment -  09/30/19 1617    Subjective  Pt states she is not sure if she is doing the exercises correctly, hasn't had any accidents this week    Pertinent History  IBS, neuropathy    Patient Stated Goals  get leakage under control and exercises to strengthen back and improve balance    Currently in Pain?  No/denies                       [facility] Adult PT Treatment/Exercise - 09/30/19 0001      Neuro Re-ed    Neuro Re-ed Details   biofeedback with breathing and kegels - training on not holding breath - exhale with contraction - sidelying and supine with pelvic tilt - needed max verbal and tactile cues initially then able to do independantly at end of the treatment             PT Education - 09/30/19 1649    Education Details  Access Code: [email or web address] URL: [email or web address] Date: 09/30/2019 Prepared by: Jari Favre  Exercises Sidelying Pelvic Floor Contraction with Self-Palpation - 10 reps - 3 sets - 1x daily - 7x weekly Supine Posterior Pelvic Tilt  with Pelvic Floor Contraction - 10 reps - 3 sets - 1x daily - 7x weekly    Person(s) Educated  Patient    Methods  Explanation;Demonstration;Handout;Verbal cues    Comprehension  Verbalized understanding;Returned demonstration       PT Short Term Goals - 09/27/19 1549      PT SHORT TERM GOAL #1   Title  Pt will report 25% improved pain    Time  4    Period  Weeks    Status  New    Target Date  10/22/19      PT SHORT TERM GOAL #2   Title  Pt will report she is able to hold bowel and bladder 2x longer in order to get to the bathroom with less leakage    Time  4    Period  Weeks    Status  New    Target Date  10/22/19        PT Long Term Goals - 09/27/19 1546      PT LONG TERM GOAL #1   Title  Ind with HEP.    Time  8    Period  Weeks    Status  New    Target Date  11/19/19      PT LONG TERM GOAL #2   Title  5 x sit to stand <22 seconds for reduced risk of falls    Time  8    Period  Weeks     Status  New    Target Date  11/19/19      PT LONG TERM GOAL #3   Title  LE weakness of at least 4+/5 for imporved stability    Time  8    Period  Weeks    Status  New    Target Date  11/19/19      PT LONG TERM GOAL #4   Title  Leakage reduced by at least 50%    Time  8    Period  Weeks    Status  New    Target Date  11/19/19      PT LONG TERM GOAL #5   Title  Reports at least 50% improvement in abdominal and low back pain due to pelvic floor strength    Time  8    Period  Weeks    Status  New    Target Date  11/19/19            Plan - 09/30/19 1650    Clinical Impression Statement  Pt did well using biofeedback to learn how to better control the pelvic floor.  At this time, she still does not feel completely confident that she will do it correctly at home, but PT feels she will succeed with the exercises added to her HEP.  Will re-assess for carry over at next visit.    PT Treatment/Interventions  ADLs/Self Care Home Management;Biofeedback;Cryotherapy;Software engineer;Therapeutic activities;Therapeutic exercise;Balance training;Neuromuscular re-education;Patient/family education;Manual techniques;Passive range of motion;Dry needling;Taping    PT Next Visit Plan  biofeedback, STM to back, f/u on stretches and kegel with pelvic tilt    PT Home Exercise Plan  [email or web address]    Consulted and Agree with Plan of Care  Patient       Patient will benefit from skilled therapeutic intervention in order to improve the following deficits and impairments:  Abnormal gait, Pain, Postural dysfunction, Decreased strength, Decreased coordination, Impaired tone, Decreased range of motion, Decreased balance  Visit Diagnosis: Unsteadiness on  feet  Muscle weakness (generalized)  Unspecified lack of coordination     Problem List Patient Active Problem List   Diagnosis Date Noted  . Spinal stenosis of lumbar region without neurogenic claudication 09/15/2019   . Urinary and fecal incontinence 09/15/2019  . Cystocele with rectocele 09/15/2019  . Radial styloid tenosynovitis (de quervain) 06/24/2019  . Trigger finger, right middle finger 06/24/2019  . Pain in left wrist 05/27/2019  . Trigger thumb, right thumb 10/29/2018  . Memory loss 07/22/2018  . Low back pain 07/22/2018  . Left lumbar radiculopathy 01/22/2018  . Mild cognitive impairment 10/22/2017  . Anxiety 10/22/2017  . Aortic atherosclerosis ([place]) 12/04/2016  . Vitamin D deficiency 08/19/2014  . Family history of colon cancer-father at 38+ grandparent w/ rectal cancer 01/14/2014  . Osteopenia 08/12/2013  . CAD (coronary artery disease), native coronary artery 03/22/2013  . Aortic valve stenosis, mild 03/22/2013  . Hyperlipemia 03/11/2013  . Hypertension 03/11/2013  . Allergic rhinitis 03/11/2013  . Left shoulder pain 03/11/2013  . IBS (irritable bowel syndrome) - with chronic recurrent abdominal pain 02/01/2012  . Personal history of colonic polyps - adenoma 02/01/2012    Jule Ser, PT 09/30/2019, 5:14 PM  [facility] [address] [place], [place], [address] Phone: [phone]   Fax:  [phone]  Name: Shelly Bennett MRN: [id number] Date of Birth: 04/21/1951

## 2019-09-30 NOTE — Patient Instructions (Signed)
Access Code: [email or web address]  URL: [email or web address]  Date: 09/30/2019  Prepared by: Jari Favre   Exercises  Sidelying Pelvic Floor Contraction with Self-Palpation - 10 reps - 3 sets - 1x daily - 7x weekly  Supine Posterior Pelvic Tilt with Pelvic Floor Contraction - 10 reps - 3 sets - 1x daily - 7x weekly

## 2019-10-01 ENCOUNTER — Encounter: Payer: Medicare Other | Admitting: Physical Therapy

## 2019-10-02 ENCOUNTER — Other Ambulatory Visit (HOSPITAL_COMMUNITY): Payer: Medicare Other

## 2019-10-05 ENCOUNTER — Encounter (HOSPITAL_BASED_OUTPATIENT_CLINIC_OR_DEPARTMENT_OTHER): Payer: Medicare Other | Admitting: Cardiology

## 2019-10-06 ENCOUNTER — Encounter: Payer: Medicare Other | Admitting: Physical Therapy

## 2019-10-12 ENCOUNTER — Encounter: Payer: Self-pay | Admitting: Physical Therapy

## 2019-10-12 ENCOUNTER — Ambulatory Visit: Payer: Medicare Other | Admitting: Physical Therapy

## 2019-10-12 ENCOUNTER — Other Ambulatory Visit: Payer: Self-pay

## 2019-10-12 DIAGNOSIS — R2681 Unsteadiness on feet: Secondary | ICD-10-CM

## 2019-10-12 DIAGNOSIS — R279 Unspecified lack of coordination: Secondary | ICD-10-CM

## 2019-10-12 DIAGNOSIS — M6281 Muscle weakness (generalized): Secondary | ICD-10-CM

## 2019-10-12 NOTE — Therapy (Signed)
[facility] [address] [place], [place], [address] Phone: [phone]   Fax:  [phone]  Physical Therapy Treatment  Patient Details  Name: Shelly Bennett MRN: [id number] Date of Birth: 1951-03-27 Referring Provider (PT): Gatha Mayer, MD   Encounter Date: 10/12/2019  PT End of Session - 10/12/19 1533    Visit Number  3    Date for PT Re-Evaluation  12/17/19    Authorization Type  [employer]HC medicare    PT Start Time  1533    PT Stop Time  [id number]    PT Time Calculation (min)  40 min    Activity Tolerance  Patient tolerated treatment well    Behavior During Therapy  [facility] for tasks assessed/performed       Past Medical History:  Diagnosis Date  . Adenomatous colon polyp   . Allergic drug reaction 06/25/2015  . Allergy   . Anxiety   . Arthritis   . At risk for falls    fallen 4 times recently   . CAD (coronary artery disease)    Dr. Burt Knack follows   . Carpal tunnel syndrome   . Chronic headaches   . Coccydynia 02/01/2012  . Cystocele   . Diverticulosis   . External hemorrhoids   . Focal nodular hyperplasia of liver   . GERD (gastroesophageal reflux disease)    some  . HCAP (healthcare-associated pneumonia) 06/25/2015  . Heart murmur   . HLD (hyperlipidemia)   . HTN (hypertension)   . IBS (irritable bowel syndrome)   . Memory loss   . Neuromuscular disorder ([place])    some neuropathy issues in the past- legs fibromyalgia in past-- past hx of steroid injections  . Obesity   . Osteoporosis   . Otitis of left ear 02/01/2017  . Pneumonia   . Skin cancer    squamous cell - face    Past Surgical History:  Procedure Laterality Date  . CARPAL TUNNEL RELEASE    . COLONOSCOPY  12/15/2008   diverticulosis, external hemorrhoids  . KNEE SURGERY  05/22/2015   right  . LAPAROSCOPY    . SKIN SURGERY     squamous cell - right face     There were no vitals filed for this visit.  Subjective Assessment -  10/12/19 1535    Subjective  The accident is better, but getting to the bathroom and getting pants down is hard.  The timing is tough, I go right as I pull down my pants    Patient Stated Goals  get leakage under control and exercises to strengthen back and improve balance    Currently in Pain?  No/denies                       [facility] Adult PT Treatment/Exercise - 10/12/19 0001      Neuro Re-ed    Neuro Re-ed Details   biofeedback with all exercises      Exercises   Exercises  Lumbar      Lumbar Exercises: Seated   Other Seated Lumbar Exercises  kegel wih ball squeeze and clam red band - 20x       Lumbar Exercises: Sidelying   Clam  5 reps   with kegel, needed tactile cues            PT Education - 10/12/19 1724    Education Details  Access Code: [email or web address]    Person(s) Educated  Patient  Methods  Explanation;Demonstration;Verbal cues;Handout;Tactile cues    Comprehension  Verbalized understanding;Returned demonstration       PT Short Term Goals - 09/27/19 1549      PT SHORT TERM GOAL #1   Title  Pt will report 25% improved pain    Time  4    Period  Weeks    Status  New    Target Date  10/22/19      PT SHORT TERM GOAL #2   Title  Pt will report she is able to hold bowel and bladder 2x longer in order to get to the bathroom with less leakage    Time  4    Period  Weeks    Status  New    Target Date  10/22/19        PT Long Term Goals - 09/27/19 1546      PT LONG TERM GOAL #1   Title  Ind with HEP.    Time  8    Period  Weeks    Status  New    Target Date  11/19/19      PT LONG TERM GOAL #2   Title  5 x sit to stand <22 seconds for reduced risk of falls    Time  8    Period  Weeks    Status  New    Target Date  11/19/19      PT LONG TERM GOAL #3   Title  LE weakness of at least 4+/5 for imporved stability    Time  8    Period  Weeks    Status  New    Target Date  11/19/19      PT LONG TERM GOAL #4   Title  Leakage reduced by  at least 50%    Time  8    Period  Weeks    Status  New    Target Date  11/19/19      PT LONG TERM GOAL #5   Title  Reports at least 50% improvement in abdominal and low back pain due to pelvic floor strength    Time  8    Period  Weeks    Status  New    Target Date  11/19/19            Plan - 10/12/19 1724    Clinical Impression Statement  Pt used biofeedback again today.  She was able to progress exercises but still needing a lot of cues and practice with each new exercises.  Pt was able to add ball squeeze to HEP.  Seated clams were added but will need to review for improved technique.  Pt continues to progress slowly with strengthening.    PT Treatment/Interventions  ADLs/Self Care Home Management;Biofeedback;Cryotherapy;Software engineer;Therapeutic activities;Therapeutic exercise;Balance training;Neuromuscular re-education;Patient/family education;Manual techniques;Passive range of motion;Dry needling;Taping    PT Next Visit Plan  biofeedback, STM to back, f/u on stretches and kegel with pelvic tilt    PT Home Exercise Plan  [id number]    Consulted and Agree with Plan of Care  Patient       Patient will benefit from skilled therapeutic intervention in order to improve the following deficits and impairments:  Abnormal gait, Pain, Postural dysfunction, Decreased strength, Decreased coordination, Impaired tone, Decreased range of motion, Decreased balance  Visit Diagnosis: No diagnosis found.     Problem List Patient Active Problem List   Diagnosis Date Noted  . Spinal stenosis of lumbar region without  neurogenic claudication 09/15/2019  . Urinary and fecal incontinence 09/15/2019  . Cystocele with rectocele 09/15/2019  . Radial styloid tenosynovitis (de quervain) 06/24/2019  . Trigger finger, right middle finger 06/24/2019  . Pain in left wrist 05/27/2019  . Trigger thumb, right thumb 10/29/2018  . Memory loss 07/22/2018  . Low back pain  07/22/2018  . Left lumbar radiculopathy 01/22/2018  . Mild cognitive impairment 10/22/2017  . Anxiety 10/22/2017  . Aortic atherosclerosis ([place]) 12/04/2016  . Vitamin D deficiency 08/19/2014  . Family history of colon cancer-father at 63+ grandparent w/ rectal cancer 01/14/2014  . Osteopenia 08/12/2013  . CAD (coronary artery disease), native coronary artery 03/22/2013  . Aortic valve stenosis, mild 03/22/2013  . Hyperlipemia 03/11/2013  . Hypertension 03/11/2013  . Allergic rhinitis 03/11/2013  . Left shoulder pain 03/11/2013  . IBS (irritable bowel syndrome) - with chronic recurrent abdominal pain 02/01/2012  . Personal history of colonic polyps - adenoma 02/01/2012    Jule Ser, PT 10/12/2019, 5:28 PM  [facility] [address] [place], [place], [address] Phone: [phone]   Fax:  [phone]  Name: Shelly Bennett MRN: [id number] Date of Birth: 02/11/1951

## 2019-10-13 ENCOUNTER — Other Ambulatory Visit (HOSPITAL_COMMUNITY)
Admission: RE | Admit: 2019-10-13 | Discharge: 2019-10-13 | Disposition: A | Discharge status: Home / Self Care | Payer: Medicare Other | Source: Ambulatory Visit | Attending: Cardiology | Admitting: Cardiology

## 2019-10-13 DIAGNOSIS — Z01812 Encounter for preprocedural laboratory examination: Secondary | ICD-10-CM | POA: Insufficient documentation

## 2019-10-13 DIAGNOSIS — Z20828 Contact with and (suspected) exposure to other viral communicable diseases: Secondary | ICD-10-CM | POA: Insufficient documentation

## 2019-10-14 LAB — NOVEL CORONAVIRUS, NAA (HOSP ORDER, SEND-OUT TO REF LAB; TAT 18-24 HRS): SARS-CoV-2, NAA: NOT DETECTED

## 2019-10-16 ENCOUNTER — Ambulatory Visit (HOSPITAL_BASED_OUTPATIENT_CLINIC_OR_DEPARTMENT_OTHER): Payer: Medicare Other | Attending: Cardiovascular Disease | Admitting: Cardiology

## 2019-10-16 ENCOUNTER — Other Ambulatory Visit: Payer: Self-pay

## 2019-10-16 DIAGNOSIS — R5383 Other fatigue: Secondary | ICD-10-CM

## 2019-10-16 DIAGNOSIS — R519 Headache, unspecified: Secondary | ICD-10-CM

## 2019-10-16 DIAGNOSIS — R0683 Snoring: Secondary | ICD-10-CM

## 2019-10-16 DIAGNOSIS — G4733 Obstructive sleep apnea (adult) (pediatric): Secondary | ICD-10-CM | POA: Diagnosis not present

## 2019-10-18 NOTE — Procedures (Signed)
    Patient Name: Shelly Bennett, Shelly Bennett Date:10/16/2019 Gender: Female D.O.B: [birth year] Age (years): 68 Referring Provider: Sherren Mocha Height (inches): 80 Interpreting Physician: Fransico Him MD, ABSM Weight (lbs): 210 RPSGT: Earney Hamburg BMI: 32 MRN: [id number] Neck Size: 16.00  CLINICAL INFORMATION Sleep Study Type: NPSG  Indication for sleep study: Fatigue, Snoring  Epworth Sleepiness Score: 8  SLEEP STUDY TECHNIQUE As per the AASM Manual for the Scoring of Sleep and Associated Events v2.3 (April 2016) with a hypopnea requiring 4% desaturations.  The channels recorded and monitored were frontal, central and occipital EEG, electrooculogram (EOG), submentalis EMG (chin), nasal and oral airflow, thoracic and abdominal wall motion, anterior tibialis EMG, snore microphone, electrocardiogram, and pulse oximetry.  MEDICATIONS Medications self-administered by patient taken the night of the study : N/A  SLEEP ARCHITECTURE The study was initiated at 11:10:53 PM and ended at 5:31:36 AM.  Sleep onset time was 33.9 minutes and the sleep efficiency was 77.3%. The total sleep time was 294.4 minutes.  Stage REM latency was 328.5 minutes.  The patient spent 2.9% of the night in stage N1 sleep, 95.4% in stage N2 sleep, 1.5% in stage N3 and 0.2% in REM.  Alpha intrusion was absent.  Supine sleep was 99.49%.  RESPIRATORY PARAMETERS The overall apnea/hypopnea index (AHI) was 5.9 per hour. There were 1 total apneas, including 0 obstructive, 1 central and 0 mixed apneas. There were 28 hypopneas and 41 RERAs.  The AHI during Stage REM sleep was 0.0 per hour.  AHI while supine was 5.9 per hour.  The mean oxygen saturation was 94.3%. The minimum SpO2 during sleep was 86.0%.  soft snoring was noted during this study.  CARDIAC DATA The 2 lead EKG demonstrated sinus rhythm. The mean heart rate was 64.1 beats per minute. Other EKG findings include: None.  LEG MOVEMENT DATA  The total PLMS were 0 with a resulting PLMS index of 0.0. Associated arousal with leg movement index was 1.4 .  IMPRESSIONS - Mild obstructive sleep apnea occurred during this study (AHI = 5.9/h). - No significant central sleep apnea occurred during this study (CAI = 0.2/h). - Mild oxygen desaturation was noted during this study (Min O2 = 86.0%). - The patient snored with soft snoring volume. - No cardiac abnormalities were noted during this study. - Clinically significant periodic limb movements did not occur during sleep. No significant associated arousals.  DIAGNOSIS - Obstructive Sleep Apnea (327.23 [G47.33 ICD-10])  RECOMMENDATIONS - Very mild obstructive sleep apnea. Most events occurred in the supine position.   - Positional therapy avoiding supine position during sleep. - Avoid alcohol, sedatives and other CNS depressants that may worsen sleep apnea and disrupt normal sleep architecture. - Sleep hygiene should be reviewed to assess factors that may improve sleep quality. - Weight management and regular exercise should be initiated or continued if appropriate.  [Electronically signed] 10/18/2019 04:33 PM  Fransico Him MD, ABSM Diplomate, American Board of Sleep Medicine

## 2019-10-19 ENCOUNTER — Ambulatory Visit: Payer: Medicare Other | Admitting: Family Medicine

## 2019-10-19 ENCOUNTER — Telehealth: Payer: Self-pay | Admitting: *Deleted

## 2019-10-19 ENCOUNTER — Encounter: Payer: Self-pay | Admitting: Family Medicine

## 2019-10-19 ENCOUNTER — Other Ambulatory Visit: Payer: Self-pay

## 2019-10-19 VITALS — BP 144/82 | HR 69 | Temp 98.4°F | Ht 68.0 in | Wt 212.6 lb

## 2019-10-19 DIAGNOSIS — Z23 Encounter for immunization: Secondary | ICD-10-CM

## 2019-10-19 DIAGNOSIS — R413 Other amnesia: Secondary | ICD-10-CM

## 2019-10-19 DIAGNOSIS — I2510 Atherosclerotic heart disease of native coronary artery without angina pectoris: Secondary | ICD-10-CM

## 2019-10-19 DIAGNOSIS — K219 Gastro-esophageal reflux disease without esophagitis: Secondary | ICD-10-CM | POA: Insufficient documentation

## 2019-10-19 DIAGNOSIS — W19XXXA Unspecified fall, initial encounter: Secondary | ICD-10-CM

## 2019-10-19 DIAGNOSIS — I10 Essential (primary) hypertension: Secondary | ICD-10-CM | POA: Insufficient documentation

## 2019-10-19 DIAGNOSIS — N813 Complete uterovaginal prolapse: Secondary | ICD-10-CM | POA: Insufficient documentation

## 2019-10-19 DIAGNOSIS — J301 Allergic rhinitis due to pollen: Secondary | ICD-10-CM | POA: Diagnosis not present

## 2019-10-19 DIAGNOSIS — R2689 Other abnormalities of gait and mobility: Secondary | ICD-10-CM

## 2019-10-19 MED ORDER — HYDROCHLOROTHIAZIDE 25 MG PO TABS: ORAL_TABLET | ORAL | 3 refills | Status: DC

## 2019-10-19 MED ORDER — PAROXETINE HCL 20 MG PO TABS: ORAL_TABLET | ORAL | 0 refills | Status: DC

## 2019-10-19 MED ORDER — AMLODIPINE BESYLATE 10 MG PO TABS: ORAL_TABLET | ORAL | 3 refills | Status: DC

## 2019-10-19 MED ORDER — MONTELUKAST SODIUM 10 MG PO TABS: 10.0000 mg | ORAL_TABLET | Freq: Every day | ORAL | 3 refills | Status: DC

## 2019-10-19 MED ORDER — BUPROPION HCL ER (XL) 150 MG PO TB24: 150.0000 mg | ORAL_TABLET | Freq: Every day | ORAL | 2 refills | Status: DC

## 2019-10-19 NOTE — Telephone Encounter (Signed)
-----   Message from Sueanne Margarita, MD sent at 10/18/2019  4:36 PM EST ----- Please let patient know that sleep study showed very minimal sleep apnea.  Most events occurred in the supine position therefore would recommend avoiding sleeping in the supine position

## 2019-10-19 NOTE — Patient Instructions (Addendum)
Take 1 tab of paxil for 1 week and then taper

## 2019-10-19 NOTE — Progress Notes (Signed)
BP (!) 144/82   Pulse 69   Temp 98.4 F (36.9 C) (Temporal)   Ht _0  (1.727 m)   Wt 212 lb 9.6 oz (96.4 kg)   SpO2 98%   BMI 32.33 kg/m    Subjective:   Patient ID: Shelly Bennett, female    DOB: Jun 24, 1951, 68 y.o.   MRN: [id number]  HPI: Shelly Bennett is a 68 y.o. female presenting on 10/19/2019 for Establish Care, Medical Management of Chronic Issues (4 month follow up), Fall (frequent falls x 6 months -  trouble with her balance), and Memory Loss (x 2 years but have gotten worse x 1 year)   HPI Patient is coming in with plaints of memory issues that been gradually worsening over the past 2 years but definitely worse over the past year.  Her daughter is here with her and has noticed significant downfalls in this and is concerned, she says she is also noticed that she is possibly having more of a shuffling gait.  They are concerned about whether it could be dementia or whether she could be developing something like Parkinson's disease.  She has been off more balance because of this.  Because of her balance she has had some falls recently.  We discussed strength being a major issue especially with being more immobile with the coronavirus.    Hypertension CAD Patient is currently on amlodipine and hydrochlorothiazide Crestor, and their blood pressure today is 144/82. Patient denies any lightheadedness or dizziness. Patient denies headaches, blurred vision, chest pains, shortness of breath, or weakness. Denies any side effects from medication and is content with current medication.   Relevant past medical, surgical, family and social history reviewed and updated as indicated. Interim medical history since our last visit reviewed. Allergies and medications reviewed and updated.  Review of Systems  Constitutional: Negative for chills and fever.  Eyes: Negative for visual disturbance.  Respiratory: Negative for chest tightness and shortness of breath.   Cardiovascular:  Negative for chest pain and leg swelling.  Musculoskeletal: Positive for gait problem. Negative for back pain.  Skin: Negative for rash.  Neurological: Positive for weakness. Negative for light-headedness and headaches.  Psychiatric/Behavioral: Positive for decreased concentration. Negative for agitation and behavioral problems.  All other systems reviewed and are negative.   Per HPI unless specifically indicated above   Allergies as of 10/19/2019      Reactions   Codeine    ? Reaction ER visit.    Mold Extract [trichophyton]    Migraine   Penicillins Other (See Comments)   "drew my legs up as child" Pt has tolerated cephalexin in the past.   Ace Inhibitors Cough   Angiotensin Receptor Blockers Cough   Levaquin [levofloxacin] Rash   Per notes from outpatient provider   Vytorin [ezetimibe-simvastatin] Other (See Comments)   cramping   Zetia [ezetimibe] Other (See Comments)   cramping      Medication List       Accurate as of October 19, 2019 11:07 AM. If you have any questions, ask your nurse or doctor.        amLODipine 10 MG tablet Commonly known as: NORVASC TAKE (1/2) TABLET DAILY.   aspirin 81 MG tablet Take 81 mg by mouth daily.   calcium carbonate 200 MG capsule Take 250 mg by mouth 2 (two) times daily.   fexofenadine 180 MG tablet Commonly known as: ALLEGRA Take 180 mg by mouth daily.   fluticasone 50 MCG/ACT nasal spray Commonly  known as: FLONASE Place 2 sprays into both nostrils daily.   hydrochlorothiazide 25 MG tablet Commonly known as: HYDRODIURIL TAKE (1/2) TABLET DAILY.   montelukast 10 MG tablet Commonly known as: SINGULAIR Take 1 tablet (10 mg total) by mouth at bedtime.   omega-3 acid ethyl esters 1 g capsule Commonly known as: LOVAZA TAKE (1) CAPSULE FOUR TIMES DAILY.   PARoxetine 20 MG tablet Commonly known as: PAXIL TAKE 1 AND 1/2 TABLETS AT BEDTIME   rosuvastatin 20 MG tablet Commonly known as: CRESTOR Take 1 tablet (20 mg  total) by mouth daily.   traMADol 50 MG tablet Commonly known as: ULTRAM Take by mouth every 6 (six) hours as needed.   Vitamin D-3 125 MCG (5000 UT) Tabs Take 1 capsule by mouth as directed. Take one capsule by mouth daily Mon thru Friday        Objective:   BP (!) 144/82   Pulse 69   Temp 98.4 F (36.9 C) (Temporal)   Ht '5\' 8"'$  (1.727 m)   Wt 212 lb 9.6 oz (96.4 kg)   SpO2 98%   BMI 32.33 kg/m   Wt Readings from Last 3 Encounters:  10/19/19 212 lb 9.6 oz (96.4 kg)  10/17/19 210 lb (95.3 kg)  09/15/19 215 lb 3.2 oz (97.6 kg)    Physical Exam Vitals signs and nursing note reviewed.  Constitutional:      General: She is not in acute distress.    Appearance: She is well-developed. She is not diaphoretic.  Eyes:     Conjunctiva/sclera: Conjunctivae normal.  Cardiovascular:     Rate and Rhythm: Normal rate and regular rhythm.     Heart sounds: Normal heart sounds. No murmur.  Pulmonary:     Effort: Pulmonary effort is normal. No respiratory distress.     Breath sounds: Normal breath sounds. No wheezing.  Musculoskeletal: Normal range of motion.        General: No tenderness.  Skin:    General: Skin is warm and dry.     Findings: No rash.  Neurological:     Mental Status: She is alert and oriented to person, place, and time.     Coordination: Coordination normal.     Comments: No tremor or rigidity noted, no abnormal facies  Psychiatric:        Behavior: Behavior normal.      Assessment & Plan:   Problem List Items Addressed This Visit      Cardiovascular and Mediastinum   Hypertension   Relevant Medications   hydrochlorothiazide (HYDRODIURIL) 25 MG tablet   amLODipine (NORVASC) 10 MG tablet   Other Relevant Orders   CBC with Differential/Platelet (Completed)   CMP14+EGFR (Completed)   CAD (coronary artery disease), native coronary artery   Relevant Medications   hydrochlorothiazide (HYDRODIURIL) 25 MG tablet   amLODipine (NORVASC) 10 MG tablet    Other Relevant Orders   CMP14+EGFR (Completed)   Lipid panel (Completed)     Respiratory   Allergic rhinitis   Relevant Medications   montelukast (SINGULAIR) 10 MG tablet     Other   Memory loss   Relevant Orders   Ambulatory referral to Neurology   VITAMIN D 25 Hydroxy (Vit-D Deficiency, Fractures) (Completed)    Other Visit Diagnoses    Balance disorder    -  Primary   Relevant Orders   Ambulatory referral to Physical Therapy   Fall, initial encounter       Relevant Orders   Ambulatory referral to  Physical Therapy   Need for immunization against influenza       Relevant Orders   Flu Vaccine QUAD High Dose(Fluad) (Completed)    Patient needs a refill on her Singulair for allergies  Because of memory issues and balance disorder and shuffling gait we will do physical therapy referral but also recommend her to go to neurology for evaluation.  Continue blood pressure medication.  Patient has not complained of syncope or lightheadedness.  Follow up plan: Return in about 4 weeks (around 11/16/2019), or if symptoms worsen or fail to improve, for Anxiety recheck, tapering off Paxil and Wellbutrin.  Counseling provided for all of the vaccine components Orders Placed This Encounter  Procedures  . Flu Vaccine QUAD High Dose(Fluad)  . CBC with Differential/Platelet  . CMP14+EGFR  . Lipid panel  . VITAMIN D 25 Hydroxy (Vit-D Deficiency, Fractures)  . Ambulatory referral to Physical Therapy  . Ambulatory referral to Neurology    Caryl Pina, MD [facility] 10/19/2019, 11:07 AM

## 2019-10-19 NOTE — Telephone Encounter (Signed)
Informed patient of sleep study results and patient understanding was verbalized. Patient understands her sleep study showed very minimal sleep apnea. Most events occurred in the supine position therefore would recommend avoiding sleeping in the supine position. Pt is aware and agreeable to her results.

## 2019-10-20 LAB — CMP14+EGFR
ALT: 30 IU/L (ref 0–32)
AST: 26 IU/L (ref 0–40)
Albumin/Globulin Ratio: 1.9 (ref 1.2–2.2)
Albumin: 4.4 g/dL (ref 3.8–4.8)
Alkaline Phosphatase: 90 IU/L (ref 39–117)
BUN/Creatinine Ratio: 12 (ref 12–28)
BUN: 10 mg/dL (ref 8–27)
Bilirubin Total: 0.5 mg/dL (ref 0.0–1.2)
CO2: 27 mmol/L (ref 20–29)
Calcium: 10.4 mg/dL — ABNORMAL HIGH (ref 8.7–10.3)
Chloride: 104 mmol/L (ref 96–106)
Creatinine, Ser: 0.84 mg/dL (ref 0.57–1.00)
GFR calc Af Amer: 82 mL/min/{1.73_m2} (ref >59)
GFR calc non Af Amer: 71 mL/min/{1.73_m2} (ref >59)
Globulin, Total: 2.3 g/dL (ref 1.5–4.5)
Glucose: 99 mg/dL (ref 65–99)
Potassium: 4.4 mmol/L (ref 3.5–5.2)
Sodium: 143 mmol/L (ref 134–144)
Total Protein: 6.7 g/dL (ref 6.0–8.5)

## 2019-10-20 LAB — CBC WITH DIFFERENTIAL/PLATELET
Basophils Absolute: 0 10*3/uL (ref 0.0–0.2)
Basos: 1 %
EOS (ABSOLUTE): 0.1 10*3/uL (ref 0.0–0.4)
Eos: 2 %
Hematocrit: 41 % (ref 34.0–46.6)
Hemoglobin: 13.6 g/dL (ref 11.1–15.9)
Immature Grans (Abs): 0 10*3/uL (ref 0.0–0.1)
Immature Granulocytes: 1 %
Lymphocytes Absolute: 1.6 10*3/uL (ref 0.7–3.1)
Lymphs: 26 %
MCH: 30.4 pg (ref 26.6–33.0)
MCHC: 33.2 g/dL (ref 31.5–35.7)
MCV: 92 fL (ref 79–97)
Monocytes Absolute: 0.4 10*3/uL (ref 0.1–0.9)
Monocytes: 6 %
Neutrophils Absolute: 4 10*3/uL (ref 1.4–7.0)
Neutrophils: 64 %
Platelets: 347 10*3/uL (ref 150–450)
RBC: 4.47 x10E6/uL (ref 3.77–5.28)
RDW: 13.2 % (ref 11.7–15.4)
WBC: 6.2 10*3/uL (ref 3.4–10.8)

## 2019-10-20 LAB — LIPID PANEL
Chol/HDL Ratio: 3.1 ratio (ref 0.0–4.4)
Cholesterol, Total: 192 mg/dL (ref 100–199)
HDL: 61 mg/dL (ref >39)
LDL Chol Calc (NIH): 108 mg/dL — ABNORMAL HIGH (ref 0–99)
Triglycerides: 130 mg/dL (ref 0–149)
VLDL Cholesterol Cal: 23 mg/dL (ref 5–40)

## 2019-10-20 LAB — VITAMIN D 25 HYDROXY (VIT D DEFICIENCY, FRACTURES): Vit D, 25-Hydroxy: 56.7 ng/mL (ref 30.0–100.0)

## 2019-10-30 ENCOUNTER — Telehealth: Payer: Self-pay | Admitting: Family Medicine

## 2019-10-30 NOTE — Telephone Encounter (Signed)
Spoke to pt and reviewed test results also gave pt contact number for Neurology and PT since both specialist have reached out to pt already to schedule.Pt voiced understanding.

## 2019-11-02 ENCOUNTER — Ambulatory Visit: Payer: Medicare Other | Admitting: Physical Therapy

## 2019-11-04 IMAGING — DX DG FOOT COMPLETE 3+V*R*
3 series · 3 of 3 positions shown · non-contrast
Comparison: Radiographs 04/14/2014.

CLINICAL DATA: Dorsal foot pain.  No known injury.

EXAM:
RIGHT FOOT COMPLETE - 3+ VIEW

[foot ap]
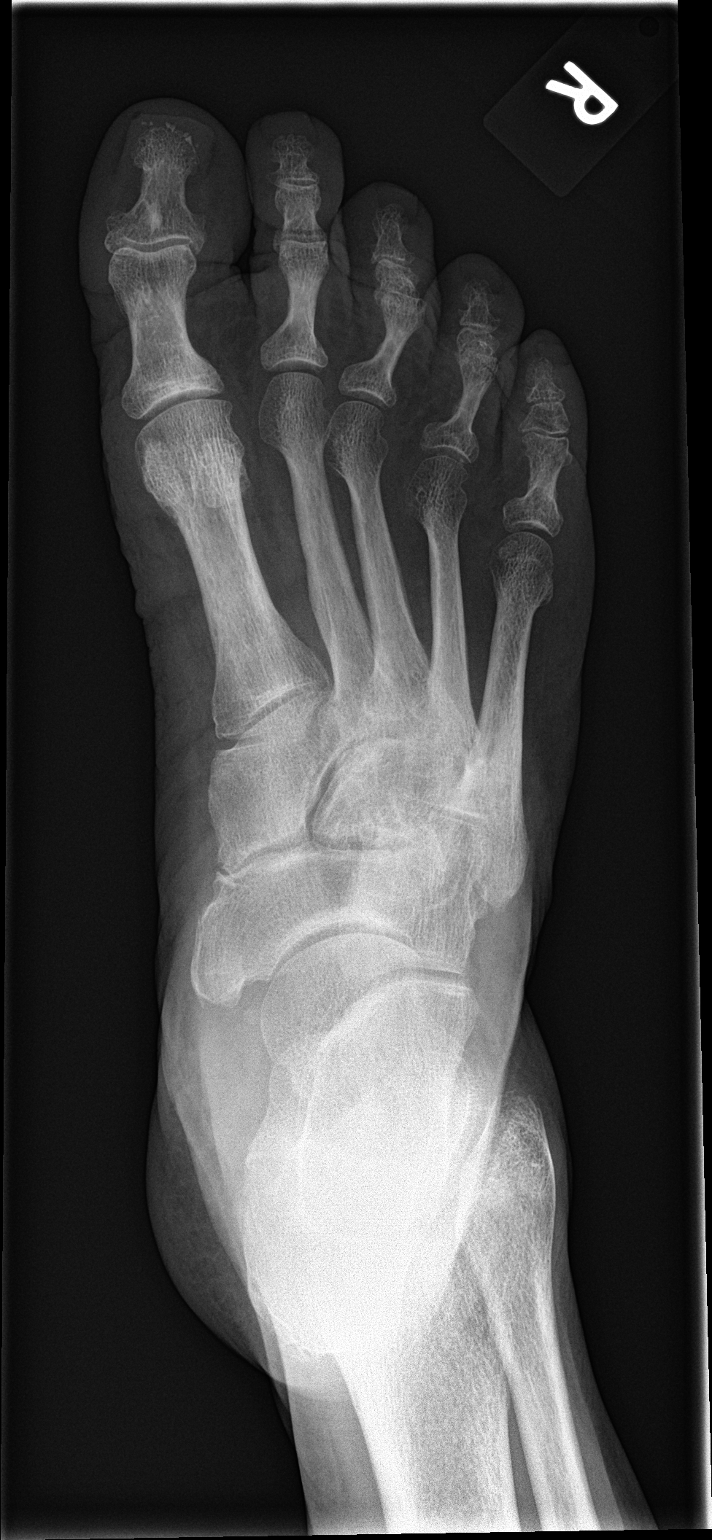

[foot obl]
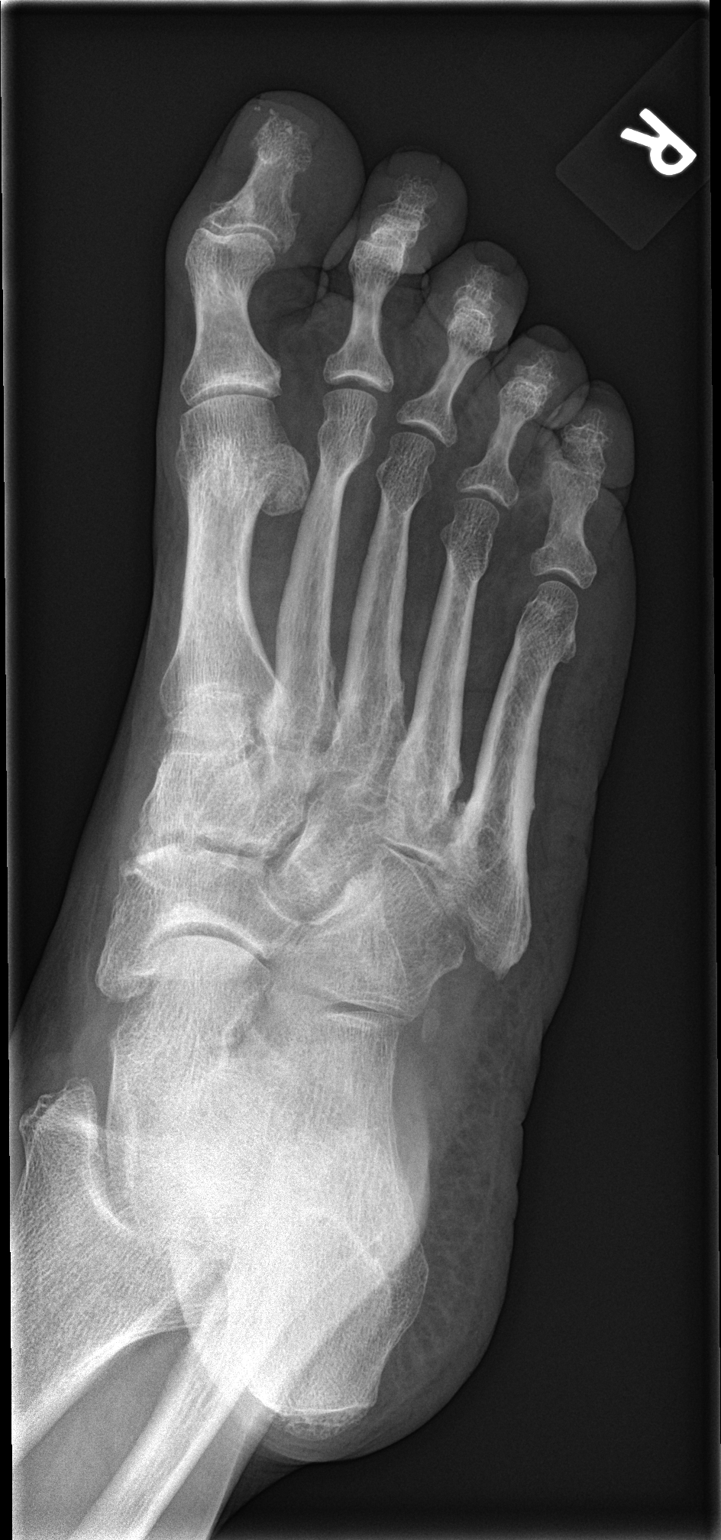

[foot lat]
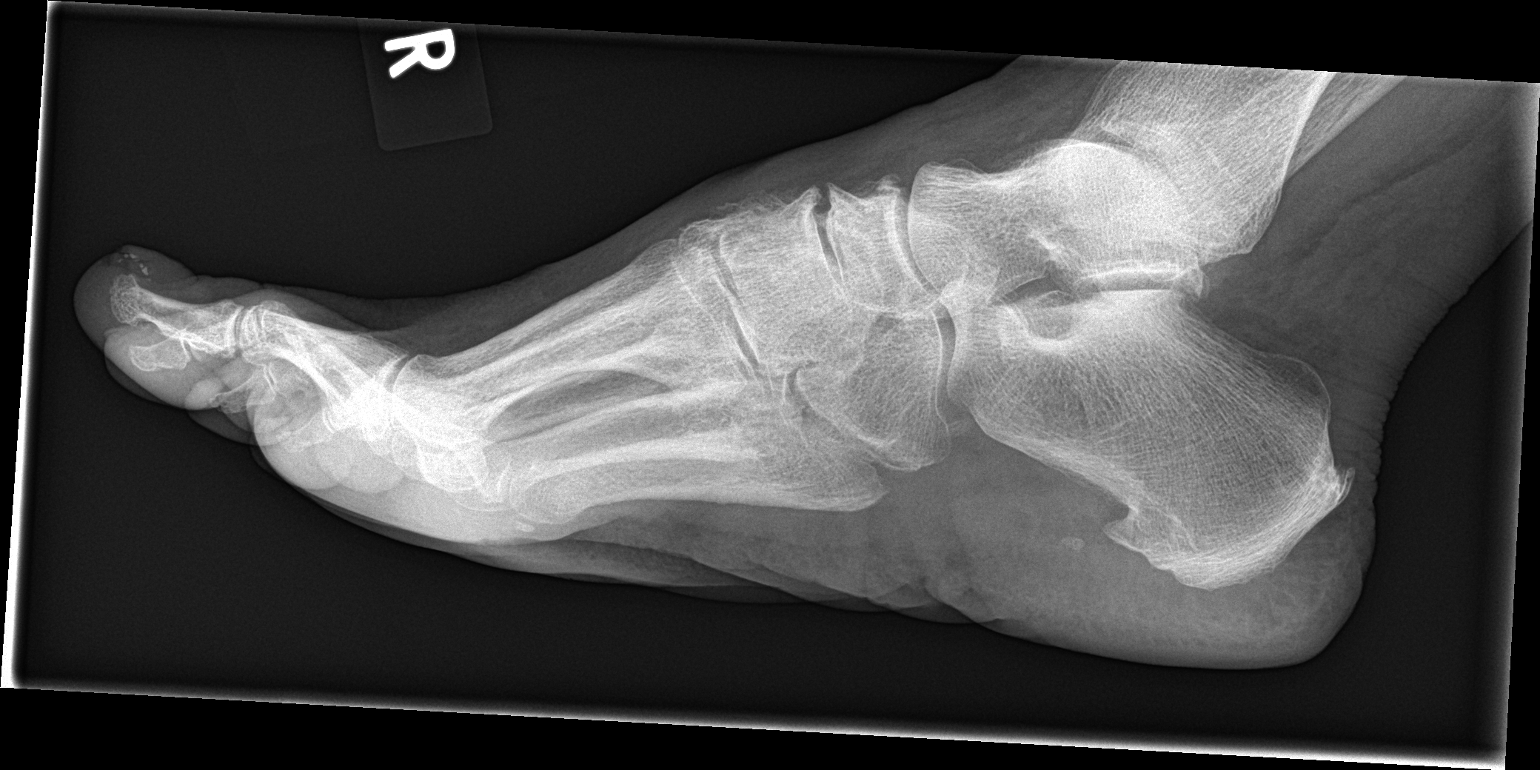

[3 of 3 positions shown; findings below may reference images not displayed]

FINDINGS: The mineralization appears adequate. There is no evidence of acute
fracture, dislocation or bone destruction. Moderate midfoot
degenerative changes are grossly stable. There are small
radiodensities within the nail bed of the great toe. Small calcaneal
spurs are stable. Possible mild dorsal midfoot soft tissue swelling.
IMPRESSION: Possible mild dorsal midfoot soft tissue swelling with underlying
moderate degenerative changes, similar to previous study. No acute
osseous findings.

## 2019-11-05 ENCOUNTER — Other Ambulatory Visit: Payer: Self-pay | Admitting: *Deleted

## 2019-11-05 MED ORDER — OMEGA-3-ACID ETHYL ESTERS 1 G PO CAPS: ORAL_CAPSULE | ORAL | 1 refills | Status: DC

## 2019-11-09 ENCOUNTER — Other Ambulatory Visit: Payer: Self-pay

## 2019-11-09 ENCOUNTER — Ambulatory Visit: Payer: Medicare Other | Attending: Internal Medicine | Admitting: Physical Therapy

## 2019-11-09 DIAGNOSIS — R279 Unspecified lack of coordination: Secondary | ICD-10-CM | POA: Diagnosis present

## 2019-11-09 DIAGNOSIS — M6281 Muscle weakness (generalized): Secondary | ICD-10-CM | POA: Diagnosis present

## 2019-11-09 DIAGNOSIS — R2681 Unsteadiness on feet: Secondary | ICD-10-CM | POA: Diagnosis present

## 2019-11-09 NOTE — Therapy (Signed)
[facility] [address], [place], [place], [address] Phone: [phone]   Fax:  [phone]  Physical Therapy Treatment  Patient Details  Name: Shelly Bennett MRN: [id number] Date of Birth: 23-Apr-1951 Referring Provider (PT): Gatha Mayer, MD   Encounter Date: 11/09/2019  PT End of Session - 11/09/19 1156    Visit Number  4    Date for PT Re-Evaluation  12/17/19    Authorization Type  [employer] medicare    PT Start Time  1147    PT Stop Time  1226    PT Time Calculation (min)  39 min    Activity Tolerance  Patient tolerated treatment well    Behavior During Therapy  [facility] for tasks assessed/performed       Past Medical History:  Diagnosis Date  . Adenomatous colon polyp   . Allergic drug reaction 06/25/2015  . Allergy   . Anxiety   . Arthritis   . At risk for falls    fallen 4 times recently   . CAD (coronary artery disease)    Dr. Burt Knack follows   . Carpal tunnel syndrome   . Chronic headaches   . Coccydynia 02/01/2012  . Cystocele   . Diverticulosis   . External hemorrhoids   . Focal nodular hyperplasia of liver   . GERD (gastroesophageal reflux disease)    some  . HCAP (healthcare-associated pneumonia) 06/25/2015  . Heart murmur   . HLD (hyperlipidemia)   . HTN (hypertension)   . IBS (irritable bowel syndrome)   . Memory loss   . Neuromuscular disorder ([place])    some neuropathy issues in the past- legs fibromyalgia in past-- past hx of steroid injections  . Obesity   . Osteoporosis   . Otitis of left ear 02/01/2017  . Pneumonia   . Skin cancer    squamous cell - face    Past Surgical History:  Procedure Laterality Date  . CARPAL TUNNEL RELEASE    . COLONOSCOPY  12/15/2008   diverticulosis, external hemorrhoids  . KNEE SURGERY  05/22/2015   right  . LAPAROSCOPY    . SKIN SURGERY     squamous cell - right face     There were no vitals filed for this visit.  Subjective Assessment -  11/09/19 1226    Subjective  I am doing better by at least 50%.  I haven't had any accidents.  I have had a hard time getting up and down from chairs.  Pt is going to start PT for balance soon and will discharge from this clinic after today's session.    Patient Stated Goals  get leakage under control and exercises to strengthen back and improve balance    Currently in Pain?  No/denies                       [facility] Adult PT Treatment/Exercise - 11/09/19 0001      Lumbar Exercises: Seated   Long Arc Quad on Chair  Strengthening;Right;Left;10 reps    Sit to Stand  10 reps   with blow as you go     Knee/Hip Exercises: Seated   Long Arc Quad  Strengthening;Both;20 reps;Weights    Long Arc Quad Weight  2 lbs.    Long Arc Quad Limitations  ball squeeze    Clamshell with Marga Hoots   single and double - 20 x each way   Knee/Hip Flexion  2lb x  20 reps with kegel and core engaged - towel roll behind lumbar spine support        nustep x 10 min L1 - cues to engage core and pelvic floor     PT Education - 11/09/19 1224    Education Details  [id number]    Person(s) Educated  Patient    Methods  Explanation;Demonstration;Verbal cues;Handout;Tactile cues    Comprehension  Verbalized understanding;Returned demonstration       PT Short Term Goals - 11/09/19 1225      PT SHORT TERM GOAL #1   Title  Pt will report 25% improved pain    Status  Achieved      PT SHORT TERM GOAL #2   Title  Pt will report she is able to hold bowel and bladder 2x longer in order to get to the bathroom with less leakage    Baseline  2x better at least        PT Long Term Goals - 11/09/19 1226      PT LONG TERM GOAL #1   Title  Ind with HEP.    Status  Achieved      PT LONG TERM GOAL #4   Title  Leakage reduced by at least 50%    Baseline  at least 50%    Status  Achieved      PT LONG TERM GOAL #5   Title  Reports at least 50% improvement in abdominal and low back pain due to  pelvic floor strength    Baseline  no complaints    Status  Partially Met            Plan - 11/09/19 1228    Clinical Impression Statement  Pt reports she has made good progress.  She is still very fearful of falling and she is going to return to PT that she was going to previously for balance.  Today's session focused on HEP for successful discharge to continue to maintain improvements she has made so far.    Comorbidities  bilateral knee surgeries, chronic back pain with sciatica and LE weakness    PT Treatment/Interventions  ADLs/Self Care Home Management;Biofeedback;Cryotherapy;Electrical Stimulation;Moist Heat;Gait training;Therapeutic activities;Therapeutic exercise;Balance training;Neuromuscular re-education;Patient/family education;Manual techniques;Passive range of motion;Dry needling;Taping    PT Next Visit Plan  discharged today    PT Home Exercise Plan  [id number]    Consulted and Agree with Plan of Care  Patient       Patient will benefit from skilled therapeutic intervention in order to improve the following deficits and impairments:  Abnormal gait, Pain, Postural dysfunction, Decreased strength, Decreased coordination, Impaired tone, Decreased range of motion, Decreased balance  Visit Diagnosis: Unsteadiness on feet  Muscle weakness (generalized)  Unspecified lack of coordination     Problem List Patient Active Problem List   Diagnosis Date Noted  . Gastroesophageal reflux disease 10/19/2019  . Hypertensive disorder 10/19/2019  . Third degree uterine prolapse 10/19/2019  . Spinal stenosis of lumbar region without neurogenic claudication 09/15/2019  . Urinary and fecal incontinence 09/15/2019  . Cystocele with rectocele 09/15/2019  . Radial styloid tenosynovitis (de quervain) 06/24/2019  . Trigger finger, right middle finger 06/24/2019  . Pain in left wrist 05/27/2019  . Trigger thumb, right thumb 10/29/2018  . Memory loss 07/22/2018  . Low back pain  07/22/2018  . Left lumbar radiculopathy 01/22/2018  . Mild cognitive impairment 10/22/2017  . Anxiety 10/22/2017  . Aortic atherosclerosis (HCC) 12/04/2016  . Ptosis of right eyelid 02/01/2016  .   Dermatochalasis of both upper eyelids 03/07/2015  . Vitamin D deficiency 08/19/2014  . 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 Family history of colon cancer-father at 58+ grandparent w/ rectal cancer 01/14/2014  . MGD (meibomian gland dysfunction) 10/14/2013  . Injection of surface of eye 10/14/2013  . Osteopenia 08/12/2013  . CAD (coronary artery disease), native coronary artery 03/22/2013  . Aortic valve stenosis, mild 03/22/2013  . Hyperlipemia 03/11/2013  . Hypertension 03/11/2013  . Allergic rhinitis 03/11/2013  . Left shoulder pain 03/11/2013  . IBS (irritable bowel syndrome) - with chronic recurrent abdominal pain 02/01/2012  . Personal history of colonic polyps - adenoma 02/01/2012    Jakki L , PT 11/09/2019, 12:38 PM  [facility] [address], [address] [place], [place], [address] Phone: [phone]   Fax:  [phone]  Name: Dejah Gail Pinn MRN: [id number] Date of Birth: 06/13/1951  PHYSICAL THERAPY DISCHARGE SUMMARY  Visits from Start of Care: 4  Current functional level related to goals / functional outcomes: See above   Remaining deficits: See above   Education / Equipment: HEP  Plan: Patient agrees to discharge.  Patient goals were partially met. Patient is being discharged due to the patient's request.  ?????    Patient will go to another PT clinic for balance  Jakki , PT 11/09/19 12:40 PM   

## 2019-11-09 NOTE — Patient Instructions (Signed)
Access Code: [email or web address]  URL: [email or web address]  Date: 11/09/2019  Prepared by: Jari Favre   Exercises  Sidelying Pelvic Floor Contraction with Self-Palpation - 10 reps - 3 sets - 1x daily - 7x weekly  Supine Posterior Pelvic Tilt with Pelvic Floor Contraction - 10 reps - 3 sets - 1x daily - 7x weekly  Seated Pelvic Floor Contraction with Isometric Hip Adduction - 10 reps - 1 sets - 3 sechold, 3 sec rest hold - 3x daily - 7x weekly  Seated Pelvic Floor Contraction with Hip Abduction and Resistance Loop - 10 reps - 1 sets - 3 sec hold - 3x daily - 7x weekly  Seated Long Arc Quad with Hip Adduction - 10 reps - 3 sets - 1x daily - 7x weekly  Seated Hamstring Curls with Resistance - 10 reps - 3 sets - 1x daily - 7x weekly

## 2019-11-18 ENCOUNTER — Other Ambulatory Visit: Payer: Self-pay

## 2019-11-19 ENCOUNTER — Ambulatory Visit: Payer: Medicare Other | Admitting: Family Medicine

## 2019-11-19 ENCOUNTER — Other Ambulatory Visit: Payer: Self-pay

## 2019-11-23 ENCOUNTER — Encounter: Payer: Self-pay | Admitting: Family Medicine

## 2019-11-23 ENCOUNTER — Ambulatory Visit: Payer: Medicare Other | Admitting: Family Medicine

## 2019-11-23 VITALS — BP 147/83 | HR 77 | Temp 97.8°F | Ht 68.0 in | Wt 209.4 lb

## 2019-11-23 DIAGNOSIS — G4452 New daily persistent headache (NDPH): Secondary | ICD-10-CM

## 2019-11-23 DIAGNOSIS — R2689 Other abnormalities of gait and mobility: Secondary | ICD-10-CM | POA: Diagnosis not present

## 2019-11-23 DIAGNOSIS — R296 Repeated falls: Secondary | ICD-10-CM | POA: Diagnosis not present

## 2019-11-23 DIAGNOSIS — R519 Headache, unspecified: Secondary | ICD-10-CM | POA: Diagnosis not present

## 2019-11-23 DIAGNOSIS — M2550 Pain in unspecified joint: Secondary | ICD-10-CM

## 2019-11-23 NOTE — Progress Notes (Signed)
BP (!) 147/83   Pulse 77   Temp 97.8 F (36.6 C) (Temporal)   Ht 5\' 8"  (1.727 m)   Wt 209 lb 6.4 oz (95 kg)   SpO2 96%   BMI 31.84 kg/m    Subjective:   Patient ID: Shelly Bennett, female    DOB: [birth year], 68 y.o.   MRN: [id number]  HPI: Shelly Bennett is a 67 y.o. female presenting on 11/23/2019 for Anxiety (4 week follow up)   HPI Anxiety recheck Patient is currently taking Wellbutrin to help with anxiety and she feels like it is helping except for the fact that she has these health concerns that are just not improving and that makes her more anxious.  She is also very anxious about falling because of her increasing weakness and difficulty. Depression screen [facility] 2/9 11/23/2019 06/16/2019 06/16/2019 02/05/2019 11/21/2018  Decreased Interest 1 0 0 2 0  Down, Depressed, Hopeless 2 0 0 2 0  PHQ - 2 Score 3 0 0 4 0  Altered sleeping 0 - - 0 -  Tired, decreased energy 2 - - 0 -  Change in appetite 3 - - 0 -  Feeling bad or failure about yourself  0 - - 0 -  Trouble concentrating 0 - - 0 -  Moving slowly or fidgety/restless 3 - - 0 -  Suicidal thoughts 0 - - 0 -  PHQ-9 Score 11 - - 4 -  Difficult doing work/chores Not difficult at all - - Somewhat difficult -  Some recent data might be hidden    Patient has complaints of new headaches on the left side of her face behind her eye that have been coming every day and have been worsening.  She is also had increase in memory issues as well as increasing arthralgias and also balance issues and recurrent falls and she just feels like she is going downhill over the past year but has been worsened over the past couple months.  She was seen just a month ago and we did a neurology referral but then appointment is not until January and we did basic labs which all returned normal.  Patient says that she just feels like she is increasingly at risk for fall.  Relevant past medical, surgical, family and social history reviewed and updated  as indicated. Interim medical history since our last visit reviewed. Allergies and medications reviewed and updated.  Review of Systems  Constitutional: Positive for fatigue. Negative for chills and fever.  Eyes: Negative for visual disturbance.  Respiratory: Negative for chest tightness and shortness of breath.   Cardiovascular: Negative for chest pain, palpitations and leg swelling.  Gastrointestinal: Negative for abdominal pain.  Genitourinary: Negative for difficulty urinating and dysuria.  Musculoskeletal: Positive for arthralgias and myalgias. Negative for back pain and gait problem.  Skin: Negative for color change and rash.  Neurological: Positive for dizziness, weakness and headaches. Negative for light-headedness.  Psychiatric/Behavioral: Positive for confusion and decreased concentration. Negative for agitation, behavioral problems, self-injury, sleep disturbance and suicidal ideas.  All other systems reviewed and are negative.   Per HPI unless specifically indicated above   Allergies as of 11/23/2019      Reactions   Codeine    ? Reaction ER visit.    Mold Extract [trichophyton]    Migraine   Penicillins Other (See Comments)   "drew my legs up as child" Pt has tolerated cephalexin in the past.   Ace Inhibitors Cough   Angiotensin Receptor  Blockers Cough   Levaquin [levofloxacin] Rash   Per notes from outpatient provider   Vytorin [ezetimibe-simvastatin] Other (See Comments)   cramping   Zetia [ezetimibe] Other (See Comments)   cramping      Medication List       Accurate as of November 23, 2019  4:22 PM. If you have any questions, ask your nurse or doctor.        STOP taking these medications   PARoxetine 20 MG tablet Commonly known as: PAXIL Stopped by: Fransisca Kaufmann Moosa Bueche, MD     TAKE these medications   amLODipine 10 MG tablet Commonly known as: NORVASC TAKE (1/2) TABLET DAILY.   aspirin 81 MG tablet Take 81 mg by mouth daily.   buPROPion 150 MG  24 hr tablet Commonly known as: Wellbutrin XL Take 1 tablet (150 mg total) by mouth daily.   CALCARB 600 PO Take by mouth. What changed: Another medication with the same name was removed. Continue taking this medication, and follow the directions you see here. Changed by: Fransisca Kaufmann Jess Sulak, MD   fexofenadine 180 MG tablet Commonly known as: ALLEGRA Take 180 mg by mouth daily.   fluticasone 50 MCG/ACT nasal spray Commonly known as: FLONASE Place 2 sprays into both nostrils daily.   hydrochlorothiazide 25 MG tablet Commonly known as: HYDRODIURIL TAKE (1/2) TABLET DAILY.   montelukast 10 MG tablet Commonly known as: SINGULAIR Take 1 tablet (10 mg total) by mouth at bedtime.   omega-3 acid ethyl esters 1 g capsule Commonly known as: LOVAZA TAKE (1) CAPSULE FOUR TIMES DAILY.   rosuvastatin 20 MG tablet Commonly known as: CRESTOR Take 1 tablet (20 mg total) by mouth daily.   Vitamin D-3 125 MCG (5000 UT) Tabs Take 1 capsule by mouth as directed. Take one capsule by mouth daily Mon thru Friday        Objective:   BP (!) 147/83   Pulse 77   Temp 97.8 F (36.6 C) (Temporal)   Ht 5\' 8"  (1.727 m)   Wt 209 lb 6.4 oz (95 kg)   SpO2 96%   BMI 31.84 kg/m   Wt Readings from Last 3 Encounters:  11/23/19 209 lb 6.4 oz (95 kg)  10/19/19 212 lb 9.6 oz (96.4 kg)  10/17/19 210 lb (95.3 kg)    Physical Exam Vitals signs and nursing note reviewed.  Constitutional:      General: She is not in acute distress.    Appearance: She is well-developed. She is not diaphoretic.  Eyes:     Extraocular Movements: Extraocular movements intact.     Conjunctiva/sclera: Conjunctivae normal.     Pupils: Pupils are equal, round, and reactive to light.  Cardiovascular:     Rate and Rhythm: Normal rate and regular rhythm.     Heart sounds: Normal heart sounds. No murmur.  Pulmonary:     Effort: Pulmonary effort is normal. No respiratory distress.     Breath sounds: Normal breath sounds. No  wheezing.  Musculoskeletal: Normal range of motion.        General: No tenderness.  Skin:    General: Skin is warm and dry.     Findings: No rash.  Neurological:     Mental Status: She is alert and oriented to person, place, and time.     Motor: Weakness (4-5 strength in all 4 extremities) present.     Coordination: Coordination normal.  Psychiatric:        Behavior: Behavior normal.  Assessment & Plan:   Problem List Items Addressed This Visit    None    Visit Diagnoses    Balance disorder    -  Primary   Relevant Orders   Arthritis Panel   Sedimentation rate   High sensitivity CRP   ANA Comprehensive Panel   Vitamin B12   Thyroid Panel With TSH   Lyme Ab/Western Blot Reflex   red meat allergy   Rocky mtn spotted fvr abs pnl(IgG+IgM)   RPR   Recurrent falls       Relevant Orders   Arthritis Panel   Sedimentation rate   High sensitivity CRP   ANA Comprehensive Panel   Vitamin B12   Thyroid Panel With TSH   Lyme Ab/Western Blot Reflex   red meat allergy   Rocky mtn spotted fvr abs pnl(IgG+IgM)   RPR   New onset of headaches after age 75       Relevant Orders   Arthritis Panel   Sedimentation rate   High sensitivity CRP   ANA Comprehensive Panel   MR Brain Wo Contrast   Vitamin B12   Thyroid Panel With TSH   Lyme Ab/Western Blot Reflex   red meat allergy   Rocky mtn spotted fvr abs pnl(IgG+IgM)   RPR   Polyarthralgia       Relevant Orders   Arthritis Panel   Sedimentation rate   High sensitivity CRP   ANA Comprehensive Panel   Vitamin B12   Thyroid Panel With TSH   Lyme Ab/Western Blot Reflex   red meat allergy   Rocky mtn spotted fvr abs pnl(IgG+IgM)   RPR   New daily persistent headache       Relevant Orders   MR Brain Wo Contrast   Vitamin B12   Thyroid Panel With TSH   Lyme Ab/Western Blot Reflex   red meat allergy   Rocky mtn spotted fvr abs pnl(IgG+IgM)   RPR      We will order an MRI brain while we are waiting for neurology  referral and go ahead and do an rheumatological panel and see if anything shows. Follow up plan: Return in about 7 weeks (around 01/11/2020), or if symptoms worsen or fail to improve, for Follow-up after neurology.  Counseling provided for all of the vaccine components Orders Placed This Encounter  Procedures  . MR Brain Wo Contrast  . Arthritis Panel  . Sedimentation rate  . High sensitivity CRP  . ANA Comprehensive Panel  . Vitamin B12  . Thyroid Panel With TSH  . Lyme Ab/Western Blot Reflex  . red meat allergy  . Rocky mtn spotted fvr abs pnl(IgG+IgM)  . [place], MD [facility] 11/23/2019, 4:22 PM

## 2019-11-24 ENCOUNTER — Ambulatory Visit: Payer: Medicare Other | Attending: Family Medicine | Admitting: Physical Therapy

## 2019-11-24 ENCOUNTER — Encounter: Payer: Self-pay | Admitting: Physical Therapy

## 2019-11-24 ENCOUNTER — Other Ambulatory Visit: Payer: Self-pay

## 2019-11-24 DIAGNOSIS — R279 Unspecified lack of coordination: Secondary | ICD-10-CM | POA: Insufficient documentation

## 2019-11-24 DIAGNOSIS — R296 Repeated falls: Secondary | ICD-10-CM | POA: Insufficient documentation

## 2019-11-24 DIAGNOSIS — M6281 Muscle weakness (generalized): Secondary | ICD-10-CM | POA: Diagnosis present

## 2019-11-24 DIAGNOSIS — R2681 Unsteadiness on feet: Secondary | ICD-10-CM | POA: Diagnosis present

## 2019-11-24 NOTE — Therapy (Signed)
[facility] [place], [place], [address] Phone: [phone]   Fax:  [phone]  Physical Therapy Evaluation  Patient Details  Name: Shelly Bennett MRN: [id number] Date of Birth: 1951/10/28 Referring Provider (PT): Caryl Pina, MD   Encounter Date: 11/24/2019  PT End of Session - 11/24/19 1310    Visit Number  1    Number of Visits  12    Date for PT Re-Evaluation  01/12/20    Authorization Type  progress note every 10th visit, KX modifier at 15th visit    PT Start Time  1030    PT Stop Time  1116    PT Time Calculation (min)  46 min    Activity Tolerance  Patient tolerated treatment well    Behavior During Therapy  [facility] for tasks assessed/performed;Anxious       Past Medical History:  Diagnosis Date  . Adenomatous colon polyp   . Allergic drug reaction 06/25/2015  . Allergy   . Anxiety   . Arthritis   . At risk for falls    fallen 4 times recently   . CAD (coronary artery disease)    Dr. Burt Knack follows   . Carpal tunnel syndrome   . Chronic headaches   . Coccydynia 02/01/2012  . Cystocele   . Diverticulosis   . External hemorrhoids   . Focal nodular hyperplasia of liver   . GERD (gastroesophageal reflux disease)    some  . HCAP (healthcare-associated pneumonia) 06/25/2015  . Heart murmur   . HLD (hyperlipidemia)   . HTN (hypertension)   . IBS (irritable bowel syndrome)   . Memory loss   . Neuromuscular disorder ([place])    some neuropathy issues in the past- legs fibromyalgia in past-- past hx of steroid injections  . Obesity   . Osteoporosis   . Otitis of left ear 02/01/2017  . Pneumonia   . Skin cancer    squamous cell - face    Past Surgical History:  Procedure Laterality Date  . CARPAL TUNNEL RELEASE    . COLONOSCOPY  12/15/2008   diverticulosis, external hemorrhoids  . KNEE SURGERY  05/22/2015   right  . LAPAROSCOPY    . SKIN SURGERY     squamous cell - right face     There were  no vitals filed for this visit.   Subjective Assessment - 11/24/19 1307    Subjective  COVID-19 screening performed upon arrival.Patient arrives to physical therapy with reports of repeated falls and fear of falling. Patient reports eight falls in the past 6 months and reported her most recent fall in the driveway and required her husband to assist her to standing. Patient reports she has a walker and cane at home but does not utilize them as she prefers to hold on to furniture and walls while ambulating around her home. Patient reports assistance for ADLs and dressing to prevent falls. Patient's goals are to improve movement, improve strength, and decrease falls.    Pertinent History  HTN, heart murmur, IBS, neuropathy    Limitations  Standing;House hold activities    Diagnostic tests  MRI: normal results per patient    Patient Stated Goals  improve balance    Currently in Pain?  --   Pain in extremities        [facility] PT Assessment - 11/24/19 0001      Assessment   Medical Diagnosis  Balance disorder, fall    Referring Provider (PT)  Vonna Kotyk  [place], MD    Onset Date/Surgical Date  --   ongoing   Next MD Visit  February 2021    Prior Therapy  yes      Precautions   Precautions  Fall      Restrictions   Weight Bearing Restrictions  No      Balance Screen   Has the patient fallen in the past 6 months  Yes    How many times?  8    Has the patient had a decrease in activity level because of a fear of falling?   Yes    Is the patient reluctant to leave their home because of a fear of falling?   Yes      [place]  Private residence    Living Arrangements  Spouse/significant other      Prior Function   Level of Independence  Independent with basic ADLs;Independent with household mobility without device      ROM / Strength   AROM / PROM / Strength  Strength      Strength   Strength Assessment Site  Knee;Ankle    Right/Left Knee  Right;Left     Right Knee Flexion  4/5    Right Knee Extension  4/5    Left Knee Flexion  4/5    Left Knee Extension  4/5    Right/Left Ankle  Right;Left    Right Ankle Dorsiflexion  4+/5    Right Ankle Inversion  4+/5    Right Ankle Eversion  4+/5    Left Ankle Dorsiflexion  4+/5    Left Ankle Inversion  4+/5    Left Ankle Eversion  4+/5      Transfers   Five time sit to stand comments   26.59 seconds modified    Comments  slow transitions to standing,      Ambulation/Gait   Gait Pattern  Step-through pattern;Decreased step length - right;Decreased step length - left;Decreased stride length;Antalgic;Wide base of support      Standardized Balance Assessment   Standardized Balance Assessment  Berg Balance Test      Berg Balance Test   Sit to Stand  Able to stand  independently using hands    Standing Unsupported  Able to stand 2 minutes with supervision    Sitting with Back Unsupported but Feet Supported on Floor or Stool  Able to sit safely and securely 2 minutes    Stand to Sit  Controls descent by using hands    Transfers  Able to transfer safely, definite need of hands    Standing Unsupported with Eyes Closed  Able to stand 10 seconds with supervision    Standing Unsupported with Feet Together  Able to place feet together independently and stand for 1 minute with supervision                Objective measurements completed on examination: See above findings.              PT Education - 11/24/19 1309    Education Details  draw ins, hip abduction, hip adduction, bridges    Person(s) Educated  Patient    Methods  Explanation;Demonstration;Handout    Comprehension  Verbalized understanding       PT Short Term Goals - 11/24/19 1312      PT SHORT TERM GOAL #1   Title  STG=LTG        PT Long Term Goals - 11/24/19 1350  PT LONG TERM GOAL #1   Title  Patient will be independent with HEP    Time  6    Period  Weeks    Status  New      PT LONG TERM GOAL #2    Title  Patient will perform modified 5x sit to stand in 22 seconds or less to reduce risk of falls.    Time  6    Period  Weeks    Status  New      PT LONG TERM GOAL #3   Title  Patient will improve LE MMT to 4+/5 or greater to improve stability during functional tasks.    Time  6    Period  Weeks    Status  New      PT LONG TERM GOAL #4   Title  Patient will improve Berg Balance scale by 4 points or greater to decrease risk of falls.    Time  6    Period  Weeks    Status  New      PT LONG TERM GOAL #5   Title  --    Baseline  -             Plan - 11/24/19 1347    Clinical Impression Statement  Patient is a 68 year old female who presents to physical therapy with decreased balance, decreased functional LE strength, and fear of falling. Patient's modified 5x sit to stand time of 29.59 seconds categorizes her as a fall risk with decreased functional LE strength. Patient noted with increased time for sit to stands as well as starting to ambulate after standing. Patient and PT discussed plan of care to address balance deficits. Patient reported understanding. Patient would benefit from skilled physical therapy to address deficits and address patient's goals.    Personal Factors and Comorbidities  Comorbidity 1;Time since onset of injury/illness/exacerbation    Comorbidities  HTN, heart murmur; bilateral knee scopes, chronic back pain with sciatica and LE weakness    Stability/Clinical Decision Making  Stable/Uncomplicated    Clinical Decision Making  Low    Rehab Potential  Fair    PT Frequency  2x / week    PT Duration  6 weeks    PT Treatment/Interventions  ADLs/Self Care Home Management;Biofeedback;Cryotherapy;Software engineer;Therapeutic activities;Therapeutic exercise;Balance training;Neuromuscular re-education;Patient/family education;Manual techniques;Passive range of motion;Dry needling;Taping    PT Next Visit Plan  Complete BERG and send  to [facility], balance activities, LE strengthening    PT Home Exercise Plan  see patient education    Consulted and Agree with Plan of Care  Patient       Patient will benefit from skilled therapeutic intervention in order to improve the following deficits and impairments:  Abnormal gait, Pain, Postural dysfunction, Decreased strength, Decreased coordination, Impaired tone, Decreased range of motion, Decreased balance  Visit Diagnosis: Unsteadiness on feet  Repeated falls     Problem List Patient Active Problem List   Diagnosis Date Noted  . Gastroesophageal reflux disease 10/19/2019  . Hypertensive disorder 10/19/2019  . Third degree uterine prolapse 10/19/2019  . Spinal stenosis of lumbar region without neurogenic claudication 09/15/2019  . Urinary and fecal incontinence 09/15/2019  . Cystocele with rectocele 09/15/2019  . Radial styloid tenosynovitis (de quervain) 06/24/2019  . Trigger finger, right middle finger 06/24/2019  . Pain in left wrist 05/27/2019  . Trigger thumb, right thumb 10/29/2018  . Memory loss 07/22/2018  . Low back pain 07/22/2018  . Left  lumbar radiculopathy 01/22/2018  . Mild cognitive impairment 10/22/2017  . Anxiety 10/22/2017  . Aortic atherosclerosis ([place]) 12/04/2016  . Ptosis of right eyelid 02/01/2016  . Dermatochalasis of both upper eyelids 03/07/2015  . Vitamin D deficiency 08/19/2014  . 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 Family history of colon cancer-father at 92+ grandparent w/ rectal cancer 01/14/2014  . MGD (meibomian gland dysfunction) 10/14/2013  . Injection of surface of eye 10/14/2013  . Osteopenia 08/12/2013  . CAD (coronary artery disease), native coronary artery 03/22/2013  . Aortic valve stenosis, mild 03/22/2013  . Hyperlipemia 03/11/2013  . Hypertension 03/11/2013  . Allergic rhinitis 03/11/2013  . Left shoulder pain 03/11/2013  . IBS (irritable bowel syndrome) - with chronic recurrent  abdominal pain 02/01/2012  . Personal history of colonic polyps - adenoma 02/01/2012    Gabriela Eves, PT, DPT 11/24/2019, 1:56 PM  [facility] [address] [place], [place], [address] Phone: [phone]   Fax:  [phone]  Name: Shelly Bennett MRN: [id number] Date of Birth: May 24, 1951

## 2019-11-27 ENCOUNTER — Encounter: Payer: Self-pay | Admitting: Physical Therapy

## 2019-11-27 ENCOUNTER — Ambulatory Visit: Payer: Medicare Other | Admitting: Physical Therapy

## 2019-11-27 ENCOUNTER — Other Ambulatory Visit: Payer: Self-pay

## 2019-11-27 DIAGNOSIS — R2681 Unsteadiness on feet: Secondary | ICD-10-CM | POA: Diagnosis not present

## 2019-11-27 DIAGNOSIS — R296 Repeated falls: Secondary | ICD-10-CM

## 2019-11-27 NOTE — Therapy (Signed)
[facility] [place], [place], [address] Phone: [phone]   Fax:  [phone]  Physical Therapy Treatment  Patient Details  Name: Shelly Bennett MRN: [id number] Date of Birth: [birth year] Referring Provider (PT): Caryl Pina, MD   Encounter Date: 11/27/2019  PT End of Session - 11/27/19 1045    Visit Number  2    Number of Visits  12    Date for PT Re-Evaluation  01/12/20    Authorization Type  progress note every 10th visit, KX modifier at 15th visit    PT Start Time  1041   limited by late arrival   PT Stop Time  1115    PT Time Calculation (min)  34 min    Equipment Utilized During Treatment  Other (comment)   SPC   Activity Tolerance  Patient tolerated treatment well    Behavior During Therapy  [facility] for tasks assessed/performed;Anxious       Past Medical History:  Diagnosis Date  . Adenomatous colon polyp   . Allergic drug reaction 06/25/2015  . Allergy   . Anxiety   . Arthritis   . At risk for falls    fallen 4 times recently   . CAD (coronary artery disease)    Dr. Burt Knack follows   . Carpal tunnel syndrome   . Chronic headaches   . Coccydynia 02/01/2012  . Cystocele   . Diverticulosis   . External hemorrhoids   . Focal nodular hyperplasia of liver   . GERD (gastroesophageal reflux disease)    some  . HCAP (healthcare-associated pneumonia) 06/25/2015  . Heart murmur   . HLD (hyperlipidemia)   . HTN (hypertension)   . IBS (irritable bowel syndrome)   . Memory loss   . Neuromuscular disorder ([place])    some neuropathy issues in the past- legs fibromyalgia in past-- past hx of steroid injections  . Obesity   . Osteoporosis   . Otitis of left ear 02/01/2017  . Pneumonia   . Skin cancer    squamous cell - face    Past Surgical History:  Procedure Laterality Date  . CARPAL TUNNEL RELEASE    . COLONOSCOPY  12/15/2008   diverticulosis, external hemorrhoids  . KNEE SURGERY  05/22/2015   right  . LAPAROSCOPY    . SKIN SURGERY     squamous cell - right face     There were no vitals filed for this visit.  Subjective Assessment - 11/27/19 1042    Subjective  COVID 19 screening performed on patient upon arrival. Patient brought in new SPC. Has MRI next week. Reporting joint pain and intermittant hand drawing.    Pertinent History  HTN, heart murmur, IBS, neuropathy    Limitations  Standing;House hold activities    Diagnostic tests  MRI: normal results per patient    Patient Stated Goals  improve balance    Currently in Pain?  Yes    Pain Score  --   No pain rating provided   Pain Location  Shoulder    Pain Orientation  Left;Right    Pain Descriptors / Indicators  Discomfort    Pain Type  Acute pain    Pain Onset  More than a month ago    Pain Frequency  Constant         [facility] PT Assessment - 11/27/19 0001      Assessment   Medical Diagnosis  Balance disorder, fall    Referring Provider (PT)  Vonna Kotyk  Dettinger, MD    Next MD Visit  February 2021    Prior Therapy  yes      Precautions   Precautions  Fall      Restrictions   Weight Bearing Restrictions  No                   [facility] Adult PT Treatment/Exercise - 11/27/19 0001      Ambulation/Gait   Ambulation/Gait  Yes    Ambulation/Gait Assistance  5: Supervision    Ambulation Distance (Feet)  170 Feet    Assistive device  Straight cane    Gait Pattern  Step-to pattern;Step-through pattern;Decreased arm swing - right;Decreased stride length;Narrow base of support    Ambulation Surface  Level;Indoor      Lumbar Exercises: Aerobic   Nustep  L4 x17 min          Balance Exercises - 11/27/19 1120      Balance Exercises: Standing   Tandem Stance  Eyes open;Upper extremity support 2;Time   x2 min in semitandem and alternating LE   Marching Limitations  x2 min on floor    Other Standing Exercises  Heel taps to 6" step  alternating; B hip abduction x15 reps each          PT Short Term  Goals - 11/24/19 1312      PT SHORT TERM GOAL #1   Title  STG=LTG        PT Long Term Goals - 11/24/19 1350      PT LONG TERM GOAL #1   Title  Patient will be independent with HEP    Time  6    Period  Weeks    Status  New      PT LONG TERM GOAL #2   Title  Patient will perform modified 5x sit to stand in 22 seconds or less to reduce risk of falls.    Time  6    Period  Weeks    Status  New      PT LONG TERM GOAL #3   Title  Patient will improve LE MMT to 4+/5 or greater to improve stability during functional tasks.    Time  6    Period  Weeks    Status  New      PT LONG TERM GOAL #4   Title  Patient will improve Berg Balance scale by 4 points or greater to decrease risk of falls.    Time  6    Period  Weeks    Status  New      PT LONG TERM GOAL #5   Title  --    Baseline  -            Plan - 11/27/19 1213    Clinical Impression Statement  Patient presented in clinic with [facility] that she had recently purchased with cervical and shoulder pain. Patient reported falling at different times under different circumstances. Patient educated regarding three point gait pattern using SPC. Patient initially very hesitant with learning gait pattern and use of AD. VCs provided during gait training to reinforce gait pattern as well as safe AD positiioning and increasing stride length. Patient guided through more functional balance training with appropriate ankle strategy noted. Patient does have three steps to get into her home but her husband is to be making handrails for safety. Patient encouraged to utilize AD to become more confident with AD.    Personal Factors and Comorbidities  Comorbidity 1;Time since onset of injury/illness/exacerbation    Comorbidities  HTN, heart murmur; bilateral knee scopes, chronic back pain with sciatica and LE weakness    Stability/Clinical Decision Making  Stable/Uncomplicated    Rehab Potential  Fair    PT Frequency  2x / week    PT Duration  6  weeks    PT Treatment/Interventions  ADLs/Self Care Home Management;Biofeedback;Cryotherapy;Software engineer;Therapeutic activities;Therapeutic exercise;Balance training;Neuromuscular re-education;Patient/family education;Manual techniques;Passive range of motion;Dry needling;Taping    PT Next Visit Plan  Complete BERG and send to [facility], balance activities, LE strengthening    PT Home Exercise Plan  see patient education    Consulted and Agree with Plan of Care  Patient       Patient will benefit from skilled therapeutic intervention in order to improve the following deficits and impairments:  Abnormal gait, Pain, Postural dysfunction, Decreased strength, Decreased coordination, Impaired tone, Decreased range of motion, Decreased balance  Visit Diagnosis: Unsteadiness on feet  Repeated falls     Problem List Patient Active Problem List   Diagnosis Date Noted  . Gastroesophageal reflux disease 10/19/2019  . Hypertensive disorder 10/19/2019  . Third degree uterine prolapse 10/19/2019  . Spinal stenosis of lumbar region without neurogenic claudication 09/15/2019  . Urinary and fecal incontinence 09/15/2019  . Cystocele with rectocele 09/15/2019  . Radial styloid tenosynovitis (de quervain) 06/24/2019  . Trigger finger, right middle finger 06/24/2019  . Pain in left wrist 05/27/2019  . Trigger thumb, right thumb 10/29/2018  . Memory loss 07/22/2018  . Low back pain 07/22/2018  . Left lumbar radiculopathy 01/22/2018  . Mild cognitive impairment 10/22/2017  . Anxiety 10/22/2017  . Aortic atherosclerosis ([place]) 12/04/2016  . Ptosis of right eyelid 02/01/2016  . Dermatochalasis of both upper eyelids 03/07/2015  . Vitamin D deficiency 08/19/2014  . 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 Family history of colon cancer-father at 22+ grandparent w/ rectal cancer 01/14/2014  . MGD (meibomian gland dysfunction) 10/14/2013  .  Injection of surface of eye 10/14/2013  . Osteopenia 08/12/2013  . CAD (coronary artery disease), native coronary artery 03/22/2013  . Aortic valve stenosis, mild 03/22/2013  . Hyperlipemia 03/11/2013  . Hypertension 03/11/2013  . Allergic rhinitis 03/11/2013  . Left shoulder pain 03/11/2013  . IBS (irritable bowel syndrome) - with chronic recurrent abdominal pain 02/01/2012  . Personal history of colonic polyps - adenoma 02/01/2012    Standley Brooking, PTA 11/27/2019, 12:23 PM  [facility] [address] [place], [place], [address] Phone: [phone]   Fax:  [phone]  Name: Shelly Bennett MRN: [id number] Date of Birth: 1951-11-11

## 2019-11-30 LAB — ARTHRITIS PANEL
Basophils Absolute: 0.1 10*3/uL (ref 0.0–0.2)
Basos: 1 %
EOS (ABSOLUTE): 0.1 10*3/uL (ref 0.0–0.4)
Eos: 1 %
Hematocrit: 39.4 % (ref 34.0–46.6)
Hemoglobin: 13.5 g/dL (ref 11.1–15.9)
Immature Grans (Abs): 0.1 10*3/uL (ref 0.0–0.1)
Immature Granulocytes: 1 %
Lymphocytes Absolute: 1.9 10*3/uL (ref 0.7–3.1)
Lymphs: 25 %
MCH: 31.2 pg (ref 26.6–33.0)
MCHC: 34.3 g/dL (ref 31.5–35.7)
MCV: 91 fL (ref 79–97)
Monocytes Absolute: 0.5 10*3/uL (ref 0.1–0.9)
Monocytes: 7 %
Neutrophils Absolute: 5.1 10*3/uL (ref 1.4–7.0)
Neutrophils: 65 %
Platelets: 329 10*3/uL (ref 150–450)
RBC: 4.33 x10E6/uL (ref 3.77–5.28)
RDW: 13 % (ref 11.7–15.4)
Rheumatoid fact SerPl-aCnc: <10 IU/mL (ref 0.0–13.9)
Sed Rate: 18 mm/hr (ref 0–40)
Uric Acid: 6.3 mg/dL (ref 3.0–7.2)
WBC: 7.7 10*3/uL (ref 3.4–10.8)

## 2019-11-30 LAB — ALPHA-GAL PANEL
Alpha Gal IgE*: <0.1 kU/L (ref <0.10)
Beef (Bos spp) IgE: <0.1 kU/L (ref <0.35)
Class Interpretation: 0
Class Interpretation: 0
Class Interpretation: 0
Lamb/Mutton (Ovis spp) IgE: <0.1 kU/L (ref <0.35)
Pork (Sus spp) IgE: <0.1 kU/L (ref <0.35)

## 2019-11-30 LAB — ROCKY MTN SPOTTED FVR ABS PNL(IGG+IGM)
RMSF IgG: NEGATIVE
RMSF IgM: 0.74 index (ref 0.00–0.89)

## 2019-11-30 LAB — ANA COMPREHENSIVE PANEL
Anti JO-1: <0.2 AI (ref 0.0–0.9)
Centromere Ab Screen: <0.2 AI (ref 0.0–0.9)
Chromatin Ab SerPl-aCnc: <0.2 AI (ref 0.0–0.9)
ENA RNP Ab: <0.2 AI (ref 0.0–0.9)
ENA SM Ab Ser-aCnc: <0.2 AI (ref 0.0–0.9)
ENA SSA (RO) Ab: <0.2 AI (ref 0.0–0.9)
ENA SSB (LA) Ab: <0.2 AI (ref 0.0–0.9)
Scleroderma (Scl-70) (ENA) Antibody, IgG: 4.9 AI — ABNORMAL HIGH (ref 0.0–0.9)
dsDNA Ab: 2 IU/mL (ref 0–9)

## 2019-11-30 LAB — VITAMIN B12: Vitamin B-12: 403 pg/mL (ref 232–1245)

## 2019-11-30 LAB — THYROID PANEL WITH TSH
Free Thyroxine Index: 1.6 (ref 1.2–4.9)
T3 Uptake Ratio: 22 % — ABNORMAL LOW (ref 24–39)
T4, Total: 7.3 ug/dL (ref 4.5–12.0)
TSH: 2.21 u[IU]/mL (ref 0.450–4.500)

## 2019-11-30 LAB — HIGH SENSITIVITY CRP: CRP, High Sensitivity: 4.82 mg/L — ABNORMAL HIGH (ref 0.00–3.00)

## 2019-11-30 LAB — RPR: RPR Ser Ql: NONREACTIVE

## 2019-11-30 LAB — LYME AB/WESTERN BLOT REFLEX
LYME DISEASE AB, QUANT, IGM: <0.8 index (ref 0.00–0.79)
Lyme IgG/IgM Ab: <0.91 {ISR} (ref 0.00–0.90)

## 2019-12-01 ENCOUNTER — Encounter: Payer: Medicare Other | Admitting: Physical Therapy

## 2019-12-02 ENCOUNTER — Ambulatory Visit: Payer: Medicare Other | Admitting: Physical Therapy

## 2019-12-03 ENCOUNTER — Other Ambulatory Visit: Payer: Self-pay

## 2019-12-03 ENCOUNTER — Ambulatory Visit: Payer: Medicare Other | Admitting: Physical Therapy

## 2019-12-03 ENCOUNTER — Ambulatory Visit (HOSPITAL_COMMUNITY)
Admission: RE | Admit: 2019-12-03 | Discharge: 2019-12-03 | Disposition: A | Discharge status: Home / Self Care | Payer: Medicare Other | Source: Ambulatory Visit | Attending: Family Medicine | Admitting: Family Medicine

## 2019-12-03 ENCOUNTER — Encounter: Payer: Self-pay | Admitting: Physical Therapy

## 2019-12-03 DIAGNOSIS — R2681 Unsteadiness on feet: Secondary | ICD-10-CM | POA: Diagnosis not present

## 2019-12-03 DIAGNOSIS — G4452 New daily persistent headache (NDPH): Secondary | ICD-10-CM

## 2019-12-03 DIAGNOSIS — R519 Headache, unspecified: Secondary | ICD-10-CM | POA: Diagnosis present

## 2019-12-03 DIAGNOSIS — M6281 Muscle weakness (generalized): Secondary | ICD-10-CM

## 2019-12-03 DIAGNOSIS — R296 Repeated falls: Secondary | ICD-10-CM

## 2019-12-03 MED ORDER — GADOBUTROL 1 MMOL/ML IV SOLN: 10.0000 mL | Freq: Once | INTRAVENOUS | Status: DC | PRN

## 2019-12-03 NOTE — Therapy (Signed)
[facility] [place], [place], [address] Phone: [phone]   Fax:  [phone]  Physical Therapy Treatment  Patient Details  Name: Shelly Bennett MRN: [id number] Date of Birth: 1951/10/30 Referring Provider (PT): Caryl Pina, MD   Encounter Date: 12/03/2019  PT End of Session - 12/03/19 1137    Visit Number  3    Number of Visits  12    Date for PT Re-Evaluation  01/12/20    Authorization Type  progress note every 10th visit, KX modifier at 15th visit    PT Start Time  1034    PT Stop Time  1116    PT Time Calculation (min)  42 min    Activity Tolerance  Patient limited by pain    Behavior During Therapy  [facility] for tasks assessed/performed;Anxious       Past Medical History:  Diagnosis Date  . Adenomatous colon polyp   . Allergic drug reaction 06/25/2015  . Allergy   . Anxiety   . Arthritis   . At risk for falls    fallen 4 times recently   . CAD (coronary artery disease)    Dr. Burt Knack follows   . Carpal tunnel syndrome   . Chronic headaches   . Coccydynia 02/01/2012  . Cystocele   . Diverticulosis   . External hemorrhoids   . Focal nodular hyperplasia of liver   . GERD (gastroesophageal reflux disease)    some  . HCAP (healthcare-associated pneumonia) 06/25/2015  . Heart murmur   . HLD (hyperlipidemia)   . HTN (hypertension)   . IBS (irritable bowel syndrome)   . Memory loss   . Neuromuscular disorder ([place])    some neuropathy issues in the past- legs fibromyalgia in past-- past hx of steroid injections  . Obesity   . Osteoporosis   . Otitis of left ear 02/01/2017  . Pneumonia   . Skin cancer    squamous cell - face    Past Surgical History:  Procedure Laterality Date  . CARPAL TUNNEL RELEASE    . COLONOSCOPY  12/15/2008   diverticulosis, external hemorrhoids  . KNEE SURGERY  05/22/2015   right  . LAPAROSCOPY    . SKIN SURGERY     squamous cell - right face     There were no vitals  filed for this visit.  Subjective Assessment - 12/03/19 1129    Subjective  COVID 19 screening performed on patient upon arrival. Patient arrives with reports of a flare up mulit-joint pain.    Pertinent History  HTN, heart murmur, IBS, neuropathy    Limitations  Standing;House hold activities    Diagnostic tests  MRI: normal results per patient    Patient Stated Goals  improve balance    Currently in Pain?  Yes   pain scale not provided by patient        [facility] PT Assessment - 12/03/19 0001      Assessment   Medical Diagnosis  Balance disorder, fall    Referring Provider (PT)  Caryl Pina, MD    Next MD Visit  February 2021    Prior Therapy  yes      Precautions   Precautions  Fall      Restrictions   Weight Bearing Restrictions  No      Berg Balance Test   Sit to Stand  Able to stand  independently using hands    Standing Unsupported  Able to stand 2 minutes with supervision  Sitting with Back Unsupported but Feet Supported on Floor or Stool  Able to sit safely and securely 2 minutes    Stand to Sit  Controls descent by using hands    Transfers  Able to transfer safely, definite need of hands    Standing Unsupported with Eyes Closed  Able to stand 10 seconds with supervision    Standing Unsupported with Feet Together  Able to place feet together independently and stand for 1 minute with supervision    From Standing, Reach Forward with Outstretched Arm  Can reach forward >12 cm safely (5")    From Standing Position, Pick up Object from Floor  Able to pick up shoe, needs supervision    From Standing Position, Turn to Look Behind Over each Shoulder  Turn sideways only but maintains balance    Turn 360 Degrees  Able to turn 360 degrees safely but slowly    Standing Unsupported, Alternately Place Feet on Step/Stool  Able to stand independently and complete 8 steps >20 seconds    Standing Unsupported, One Foot in Front  Needs help to step but can hold 15 seconds     Standing on One Leg  Tries to lift leg/unable to hold 3 seconds but remains standing independently    Total Score  37                   [facility] Adult PT Treatment/Exercise - 12/03/19 0001      Lumbar Exercises: Aerobic   Nustep  L4 x16 min          Balance Exercises - 12/03/19 1127      Balance Exercises: Standing   Standing Eyes Opened  Narrow base of support (BOS);2 reps;30 secs    Tandem Stance  Eyes open;30 secs;Intermittent upper extremity support        PT Education - 12/03/19 1145    Education Details  importance of cane use for safety as well as to be confident and comfortable with it.    Person(s) Educated  Patient    Methods  Explanation;Demonstration;Handout    Comprehension  Verbalized understanding       PT Short Term Goals - 11/24/19 1312      PT SHORT TERM GOAL #1   Title  STG=LTG        PT Long Term Goals - 11/24/19 1350      PT LONG TERM GOAL #1   Title  Patient will be independent with HEP    Time  6    Period  Weeks    Status  New      PT LONG TERM GOAL #2   Title  Patient will perform modified 5x sit to stand in 22 seconds or less to reduce risk of falls.    Time  6    Period  Weeks    Status  New      PT LONG TERM GOAL #3   Title  Patient will improve LE MMT to 4+/5 or greater to improve stability during functional tasks.    Time  6    Period  Weeks    Status  New      PT LONG TERM GOAL #4   Title  Patient will improve Berg Balance scale by 4 points or greater to decrease risk of falls.    Time  6    Period  Weeks    Status  New      PT LONG TERM GOAL #5  Title  --    Baseline  -            Plan - 12/03/19 1139    Clinical Impression Statement  Patient arrives with slow gait pattern secondary to a flare up of multi-joint pain. Patient did not bring AD with her today. Patient guided through [place] and provided with rests intermittently due to pain. Patient's Berg Balance Scale score of 37/56  categorizes her as a moderate fall risk. Patient and PT discussed continuing using AD at home and in the community to become more comfortable confident with gait mechanics. Patient reported understanding.    Personal Factors and Comorbidities  Comorbidity 1;Time since onset of injury/illness/exacerbation    Comorbidities  HTN, heart murmur; bilateral knee scopes, chronic back pain with sciatica and LE weakness    Stability/Clinical Decision Making  Stable/Uncomplicated    Rehab Potential  Fair    PT Frequency  2x / week    PT Duration  6 weeks    PT Treatment/Interventions  ADLs/Self Care Home Management;Biofeedback;Cryotherapy;Software engineer;Therapeutic activities;Therapeutic exercise;Balance training;Neuromuscular re-education;Patient/family education;Manual techniques;Passive range of motion;Dry needling;Taping    PT Next Visit Plan  Nustep, balance activities, LE strengthening in various positions.    PT Home Exercise Plan  see patient education    Consulted and Agree with Plan of Care  Patient       Patient will benefit from skilled therapeutic intervention in order to improve the following deficits and impairments:  Abnormal gait, Pain, Postural dysfunction, Decreased strength, Decreased coordination, Impaired tone, Decreased range of motion, Decreased balance  Visit Diagnosis: Unsteadiness on feet  Repeated falls  Muscle weakness (generalized)     Problem List Patient Active Problem List   Diagnosis Date Noted  . Gastroesophageal reflux disease 10/19/2019  . Hypertensive disorder 10/19/2019  . Third degree uterine prolapse 10/19/2019  . Spinal stenosis of lumbar region without neurogenic claudication 09/15/2019  . Urinary and fecal incontinence 09/15/2019  . Cystocele with rectocele 09/15/2019  . Radial styloid tenosynovitis (de quervain) 06/24/2019  . Trigger finger, right middle finger 06/24/2019  . Pain in left wrist 05/27/2019  .  Trigger thumb, right thumb 10/29/2018  . Memory loss 07/22/2018  . Low back pain 07/22/2018  . Left lumbar radiculopathy 01/22/2018  . Mild cognitive impairment 10/22/2017  . Anxiety 10/22/2017  . Aortic atherosclerosis ([place]) 12/04/2016  . Ptosis of right eyelid 02/01/2016  . Dermatochalasis of both upper eyelids 03/07/2015  . Vitamin D deficiency 08/19/2014  . 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 Family history of colon cancer-father at 45+ grandparent w/ rectal cancer 01/14/2014  . MGD (meibomian gland dysfunction) 10/14/2013  . Injection of surface of eye 10/14/2013  . Osteopenia 08/12/2013  . CAD (coronary artery disease), native coronary artery 03/22/2013  . Aortic valve stenosis, mild 03/22/2013  . Hyperlipemia 03/11/2013  . Hypertension 03/11/2013  . Allergic rhinitis 03/11/2013  . Left shoulder pain 03/11/2013  . IBS (irritable bowel syndrome) - with chronic recurrent abdominal pain 02/01/2012  . Personal history of colonic polyps - adenoma 02/01/2012    Gabriela Eves, PT, DPT 12/03/2019, 11:49 AM  [facility] [address] [place], [place], [address] Phone: [phone]   Fax:  [phone]  Name: Shelly Bennett MRN: [id number] Date of Birth: 09-21-1951

## 2019-12-04 ENCOUNTER — Encounter: Payer: Self-pay | Admitting: Physical Therapy

## 2019-12-04 ENCOUNTER — Other Ambulatory Visit: Payer: Self-pay

## 2019-12-04 ENCOUNTER — Ambulatory Visit: Payer: Medicare Other | Admitting: Physical Therapy

## 2019-12-04 DIAGNOSIS — R2681 Unsteadiness on feet: Secondary | ICD-10-CM | POA: Diagnosis not present

## 2019-12-04 DIAGNOSIS — R296 Repeated falls: Secondary | ICD-10-CM

## 2019-12-04 NOTE — Therapy (Signed)
[facility] [place], [place], [address] Phone: [phone]   Fax:  [phone]  Physical Therapy Treatment  Patient Details  Name: Shelly Bennett MRN: [id number] Date of Birth: 02-13-1951 Referring Provider (PT): Caryl Pina, MD   Encounter Date: 12/04/2019  PT End of Session - 12/04/19 0955    Visit Number  4    Number of Visits  12    Date for PT Re-Evaluation  01/12/20    Authorization Type  progress note every 10th visit, KX modifier at 15th visit    PT Start Time  0952    PT Stop Time  1030   limited by pain and late start to PT   PT Time Calculation (min)  38 min    Activity Tolerance  Patient limited by pain    Behavior During Therapy  [facility] for tasks assessed/performed;Anxious       Past Medical History:  Diagnosis Date  . Adenomatous colon polyp   . Allergic drug reaction 06/25/2015  . Allergy   . Anxiety   . Arthritis   . At risk for falls    fallen 4 times recently   . CAD (coronary artery disease)    Dr. Burt Knack follows   . Carpal tunnel syndrome   . Chronic headaches   . Coccydynia 02/01/2012  . Cystocele   . Diverticulosis   . External hemorrhoids   . Focal nodular hyperplasia of liver   . GERD (gastroesophageal reflux disease)    some  . HCAP (healthcare-associated pneumonia) 06/25/2015  . Heart murmur   . HLD (hyperlipidemia)   . HTN (hypertension)   . IBS (irritable bowel syndrome)   . Memory loss   . Neuromuscular disorder ([place])    some neuropathy issues in the past- legs fibromyalgia in past-- past hx of steroid injections  . Obesity   . Osteoporosis   . Otitis of left ear 02/01/2017  . Pneumonia   . Skin cancer    squamous cell - face    Past Surgical History:  Procedure Laterality Date  . CARPAL TUNNEL RELEASE    . COLONOSCOPY  12/15/2008   diverticulosis, external hemorrhoids  . KNEE SURGERY  05/22/2015   right  . LAPAROSCOPY    . SKIN SURGERY     squamous cell -  right face     There were no vitals filed for this visit.  Subjective Assessment - 12/04/19 0954    Subjective  COVID 19 screening performed on patient upon arrival. Patient arrives with reports of a flare up of pain especially in B thumbs.    Pertinent History  HTN, heart murmur, IBS, neuropathy    Limitations  Standing;House hold activities    Diagnostic tests  MRI: normal results per patient    Patient Stated Goals  improve balance    Currently in Pain?  Yes    Pain Score  --   No pain score provided   Pain Location  Other (Comment)   thumb   Pain Orientation  Right;Left    Pain Descriptors / Indicators  Discomfort    Pain Type  Acute pain    Pain Onset  More than a month ago    Pain Frequency  Constant         [facility] PT Assessment - 12/04/19 0001      Assessment   Medical Diagnosis  Balance disorder, fall    Referring Provider (PT)  Caryl Pina, MD    Next  MD Visit  February 2021    Prior Therapy  yes      Precautions   Precautions  Fall      Restrictions   Weight Bearing Restrictions  No                   [facility] Adult PT Treatment/Exercise - 12/04/19 0001      Lumbar Exercises: Aerobic   Nustep  L3 x17 min      Lumbar Exercises: Seated   Long Arc Quad on Chair  Strengthening;Both;10 reps;Weights    LAQ on Chair Weights (lbs)  4    LAQ on Chair Limitations  limited by R knee pain    Sit to Stand  5 reps   limited by R knee pain   Other Seated Lumbar Exercises  B clam red theraband x15 reps   limted by [facility]         Balance Exercises - 12/04/19 1032      Balance Exercises: Standing   Tandem Stance  Eyes open;Intermittent upper extremity support;Time   x3 min   Standing, One Foot on a Step  Eyes open;6 inch;Time   x1 min   Heel Raises Limitations  x20 reps    Toe Raise Limitations  x20 reps          PT Short Term Goals - 11/24/19 1312      PT SHORT TERM GOAL #1   Title  STG=LTG        PT Long Term Goals - 11/24/19 1350       PT LONG TERM GOAL #1   Title  Patient will be independent with HEP    Time  6    Period  Weeks    Status  New      PT LONG TERM GOAL #2   Title  Patient will perform modified 5x sit to stand in 22 seconds or less to reduce risk of falls.    Time  6    Period  Weeks    Status  New      PT LONG TERM GOAL #3   Title  Patient will improve LE MMT to 4+/5 or greater to improve stability during functional tasks.    Time  6    Period  Weeks    Status  New      PT LONG TERM GOAL #4   Title  Patient will improve Berg Balance scale by 4 points or greater to decrease risk of falls.    Time  6    Period  Weeks    Status  New      PT LONG TERM GOAL #5   Title  --    Baseline  -            Plan - 12/04/19 1109    Clinical Impression Statement  Patient presented in clinic with reports of B thumb pain which is arthiritic and being assessed. Patient limited with therex due to LBP and R knee pain. Patient anxious regarding pain with therex activities. Fairly good balance noted but patient anxious with limited UE support.    Personal Factors and Comorbidities  Comorbidity 1;Time since onset of injury/illness/exacerbation    Comorbidities  HTN, heart murmur; bilateral knee scopes, chronic back pain with sciatica and LE weakness    Stability/Clinical Decision Making  Stable/Uncomplicated    Rehab Potential  Fair    PT Frequency  2x / week    PT Duration  6  weeks    PT Treatment/Interventions  ADLs/Self Care Home Management;Biofeedback;Cryotherapy;Software engineer;Therapeutic activities;Therapeutic exercise;Balance training;Neuromuscular re-education;Patient/family education;Manual techniques;Passive range of motion;Dry needling;Taping    PT Next Visit Plan  Nustep, balance activities, LE strengthening in various positions.    PT Home Exercise Plan  see patient education    Consulted and Agree with Plan of Care  Patient       Patient will benefit from  skilled therapeutic intervention in order to improve the following deficits and impairments:  Abnormal gait, Pain, Postural dysfunction, Decreased strength, Decreased coordination, Impaired tone, Decreased range of motion, Decreased balance  Visit Diagnosis: Unsteadiness on feet  Repeated falls     Problem List Patient Active Problem List   Diagnosis Date Noted  . Gastroesophageal reflux disease 10/19/2019  . Hypertensive disorder 10/19/2019  . Third degree uterine prolapse 10/19/2019  . Spinal stenosis of lumbar region without neurogenic claudication 09/15/2019  . Urinary and fecal incontinence 09/15/2019  . Cystocele with rectocele 09/15/2019  . Radial styloid tenosynovitis (de quervain) 06/24/2019  . Trigger finger, right middle finger 06/24/2019  . Pain in left wrist 05/27/2019  . Trigger thumb, right thumb 10/29/2018  . Memory loss 07/22/2018  . Low back pain 07/22/2018  . Left lumbar radiculopathy 01/22/2018  . Mild cognitive impairment 10/22/2017  . Anxiety 10/22/2017  . Aortic atherosclerosis ([place]) 12/04/2016  . Ptosis of right eyelid 02/01/2016  . Dermatochalasis of both upper eyelids 03/07/2015  . Vitamin D deficiency 08/19/2014  . 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 Family history of colon cancer-father at 42+ grandparent w/ rectal cancer 01/14/2014  . MGD (meibomian gland dysfunction) 10/14/2013  . Injection of surface of eye 10/14/2013  . Osteopenia 08/12/2013  . CAD (coronary artery disease), native coronary artery 03/22/2013  . Aortic valve stenosis, mild 03/22/2013  . Hyperlipemia 03/11/2013  . Hypertension 03/11/2013  . Allergic rhinitis 03/11/2013  . Left shoulder pain 03/11/2013  . IBS (irritable bowel syndrome) - with chronic recurrent abdominal pain 02/01/2012  . Personal history of colonic polyps - adenoma 02/01/2012    Standley Brooking, PTA 12/04/2019, 11:14 AM  [facility] [place], [place], [address] Phone: [phone]   Fax:  [phone]  Name: Jeremiah Ostheimer MRN: [id number] Date of Birth: 18-Jul-1951

## 2019-12-07 ENCOUNTER — Telehealth: Payer: Self-pay | Admitting: Neurology

## 2019-12-07 ENCOUNTER — Ambulatory Visit: Payer: Medicare Other | Admitting: Physical Therapy

## 2019-12-07 ENCOUNTER — Telehealth: Payer: Self-pay | Admitting: Family Medicine

## 2019-12-07 ENCOUNTER — Encounter: Payer: Self-pay | Admitting: Physical Therapy

## 2019-12-07 ENCOUNTER — Other Ambulatory Visit: Payer: Self-pay

## 2019-12-07 DIAGNOSIS — R279 Unspecified lack of coordination: Secondary | ICD-10-CM

## 2019-12-07 DIAGNOSIS — R2681 Unsteadiness on feet: Secondary | ICD-10-CM | POA: Diagnosis not present

## 2019-12-07 DIAGNOSIS — R296 Repeated falls: Secondary | ICD-10-CM

## 2019-12-07 DIAGNOSIS — M6281 Muscle weakness (generalized): Secondary | ICD-10-CM

## 2019-12-07 DIAGNOSIS — R768 Other specified abnormal immunological findings in serum: Secondary | ICD-10-CM

## 2019-12-07 DIAGNOSIS — R7982 Elevated C-reactive protein (CRP): Secondary | ICD-10-CM

## 2019-12-07 NOTE — Therapy (Signed)
[facility] [place], [place], [address] Phone: [phone]   Fax:  [phone]  Physical Therapy Treatment  Patient Details  Name: Shelly Bennett MRN: [id number] Date of Birth: Dec 03, 1951 Referring Provider (PT): Caryl Pina, MD   Encounter Date: 12/07/2019  PT End of Session - 12/07/19 1439    Visit Number  5    Number of Visits  12    Date for PT Re-Evaluation  01/12/20    Authorization Type  progress note every 10th visit, KX modifier at 15th visit    PT Start Time  1345    PT Stop Time  1429    PT Time Calculation (min)  44 min    Equipment Utilized During Treatment  Other (comment)    Activity Tolerance  Patient limited by pain    Behavior During Therapy  [facility] for tasks assessed/performed;Anxious       Past Medical History:  Diagnosis Date  . Adenomatous colon polyp   . Allergic drug reaction 06/25/2015  . Allergy   . Anxiety   . Arthritis   . At risk for falls    fallen 4 times recently   . CAD (coronary artery disease)    Dr. Burt Knack follows   . Carpal tunnel syndrome   . Chronic headaches   . Coccydynia 02/01/2012  . Cystocele   . Diverticulosis   . External hemorrhoids   . Focal nodular hyperplasia of liver   . GERD (gastroesophageal reflux disease)    some  . HCAP (healthcare-associated pneumonia) 06/25/2015  . Heart murmur   . HLD (hyperlipidemia)   . HTN (hypertension)   . IBS (irritable bowel syndrome)   . Memory loss   . Neuromuscular disorder ([place])    some neuropathy issues in the past- legs fibromyalgia in past-- past hx of steroid injections  . Obesity   . Osteoporosis   . Otitis of left ear 02/01/2017  . Pneumonia   . Skin cancer    squamous cell - face    Past Surgical History:  Procedure Laterality Date  . CARPAL TUNNEL RELEASE    . COLONOSCOPY  12/15/2008   diverticulosis, external hemorrhoids  . KNEE SURGERY  05/22/2015   right  . LAPAROSCOPY    . SKIN SURGERY      squamous cell - right face     There were no vitals filed for this visit.  Subjective Assessment - 12/07/19 1427    Subjective  COVID 19 screening performed on patient upon arrival. Patient arriving reporting stiffness  in her hands and thumbs. Pt reporting that her PCP has referred her to see a Rheumatologist.    Pertinent History  HTN, heart murmur, IBS, neuropathy    Limitations  Standing;House hold activities    Diagnostic tests  MRI: normal results per patient    Patient Stated Goals  improve balance    Currently in Pain?  Yes    Pain Score  3     Pain Location  Hand    Pain Orientation  Right;Left    Pain Descriptors / Indicators  Tightness;Aching    Pain Type  Acute pain    Pain Onset  More than a month ago                       [facility] Adult PT Treatment/Exercise - 12/07/19 0001      Ambulation/Gait   Gait Comments  Pt brought in new cane today to  session. Pt's cane was adjusted to pt's height. Pt worked on step through gait pattern using a single point cane with quad tip. Pt with initial difficulty with sequencing and required stopping and starting to reset sequencing pattern.       Lumbar Exercises: Aerobic   Nustep  L4 xx 13 minutes      Lumbar Exercises: Seated   Long Arc Quad on Chair  Strengthening;Both;10 reps;Weights    LAQ on Chair Weights (lbs)  4    LAQ on Chair Limitations  limited by R knee pain    Sit to Stand  10 reps;Limitations   limited by R knee pain   Sit to Stand Limitations  resting after 5 reps    Other Seated Lumbar Exercises  B clam red theraband x15 reps   limted by [facility]         Balance Exercises - 12/07/19 1418      Balance Exercises: Standing   Rockerboard  30 seconds   x 2 sets, rocking side to side with UE support   Heel Raises  20 reps    Toe Raise  20 reps    Other Standing Exercises Comments  airex mat feet together, feet apart, 1/2 tandum stance all x 30 seconds with intermittent CGA and UE support.          PT Education - 12/07/19 1437    Education Details  Gait training. Pt's cane adjusted to pt's height.    Person(s) Educated  Patient    Methods  Explanation;Demonstration    Comprehension  Verbalized understanding;Returned demonstration       PT Short Term Goals - 11/24/19 1312      PT SHORT TERM GOAL #1   Title  STG=LTG        PT Long Term Goals - 12/07/19 1447      PT LONG TERM GOAL #1   Title  Patient will be independent with HEP    Time  6    Period  Weeks    Status  On-going      PT LONG TERM GOAL #2   Title  Patient will perform modified 5x sit to stand in 22 seconds or less to reduce risk of falls.    Time  6    Period  Weeks    Status  On-going      PT LONG TERM GOAL #3   Title  Patient will improve LE MMT to 4+/5 or greater to improve stability during functional tasks.    Time  6    Period  Weeks      PT LONG TERM GOAL #4   Title  Patient will improve Berg Balance scale by 4 points or greater to decrease risk of falls.    Baseline  at least 50%    Time  6    Period  Weeks    Status  New            Plan - 12/07/19 1440    Clinical Impression Statement  Pt presenting to therapy today repoting mild pain/stiffness in bilateral hands and thumbs. Pt tolreating exercises well. Pt arriving with a single point cane with a quad tip. Pt's cane was adjusted to pt's height and gait training performed. Pt having trouble with sequencing using cane in her L hand. Balance exercises performed today. Pt still requiring UE support for balance intermittently. Continue skilled PT.    Personal Factors and Comorbidities  Comorbidity 1;Time since onset of injury/illness/exacerbation  Comorbidities  HTN, heart murmur; bilateral knee scopes, chronic back pain with sciatica and LE weakness    Stability/Clinical Decision Making  Stable/Uncomplicated    Rehab Potential  Fair    PT Frequency  2x / week    PT Duration  6 weeks    PT Treatment/Interventions  ADLs/Self Care  Home Management;Biofeedback;Cryotherapy;Software engineer;Therapeutic activities;Therapeutic exercise;Balance training;Neuromuscular re-education;Patient/family education;Manual techniques;Passive range of motion;Dry needling;Taping    PT Next Visit Plan  Nustep, balance activities, LE strengthening in various positions.    PT Home Exercise Plan  see patient education    Consulted and Agree with Plan of Care  Patient       Patient will benefit from skilled therapeutic intervention in order to improve the following deficits and impairments:  Abnormal gait, Pain, Postural dysfunction, Decreased strength, Decreased coordination, Impaired tone, Decreased range of motion, Decreased balance  Visit Diagnosis: Unsteadiness on feet  Repeated falls  Muscle weakness (generalized)  Unspecified lack of coordination     Problem List Patient Active Problem List   Diagnosis Date Noted  . Gastroesophageal reflux disease 10/19/2019  . Hypertensive disorder 10/19/2019  . Third degree uterine prolapse 10/19/2019  . Spinal stenosis of lumbar region without neurogenic claudication 09/15/2019  . Urinary and fecal incontinence 09/15/2019  . Cystocele with rectocele 09/15/2019  . Radial styloid tenosynovitis (de quervain) 06/24/2019  . Trigger finger, right middle finger 06/24/2019  . Pain in left wrist 05/27/2019  . Trigger thumb, right thumb 10/29/2018  . Memory loss 07/22/2018  . Low back pain 07/22/2018  . Left lumbar radiculopathy 01/22/2018  . Mild cognitive impairment 10/22/2017  . Anxiety 10/22/2017  . Aortic atherosclerosis ([place]) 12/04/2016  . Ptosis of right eyelid 02/01/2016  . Dermatochalasis of both upper eyelids 03/07/2015  . Vitamin D deficiency 08/19/2014  . 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 Family history of colon cancer-father at 77+ grandparent w/ rectal cancer 01/14/2014  . MGD (meibomian gland dysfunction)  10/14/2013  . Injection of surface of eye 10/14/2013  . Osteopenia 08/12/2013  . CAD (coronary artery disease), native coronary artery 03/22/2013  . Aortic valve stenosis, mild 03/22/2013  . Hyperlipemia 03/11/2013  . Hypertension 03/11/2013  . Allergic rhinitis 03/11/2013  . Left shoulder pain 03/11/2013  . IBS (irritable bowel syndrome) - with chronic recurrent abdominal pain 02/01/2012  . Personal history of colonic polyps - adenoma 02/01/2012    Oretha Caprice, PT 12/07/2019, 2:51 PM  [facility] [address] [place], [place], [address] Phone: [phone]   Fax:  [phone]  Name: Shelly Bennett MRN: [id number] Date of Birth: 1951-10-12

## 2019-12-07 NOTE — Telephone Encounter (Signed)
-----   Message from Karle Plumber, [place] sent at 12/02/2019  4:38 PM EST ----- Patient aware and states she will go where ever you recommend.

## 2019-12-07 NOTE — Telephone Encounter (Signed)
I placed the referral for the patient.

## 2019-12-07 NOTE — Telephone Encounter (Signed)
LVM to r/s 1/11 appt due to MD being out

## 2019-12-08 NOTE — Telephone Encounter (Signed)
Good Afternoon - I sent this Patient's Ref to [facility] and they called and stated that the labs did not show a positive ANA that it just stated that it was "abnormal" - I looked back at this Patient's release and all the labs that were done including the ANA and Panel were included in the release, is there something that I am missing?

## 2019-12-08 NOTE — Telephone Encounter (Signed)
It showed a positive scleroderma antibody and there was not a diagnosis code for that, they should have seen that, I have not been liking [facility] lately. Caryl Pina, MD [facility] 12/08/2019, 6:22 PM

## 2019-12-09 ENCOUNTER — Encounter: Payer: Self-pay | Admitting: Neurology

## 2019-12-09 ENCOUNTER — Encounter: Payer: Medicare Other | Admitting: Physical Therapy

## 2019-12-09 NOTE — Telephone Encounter (Signed)
LVM x2 to r/s 12/28/2019 appt- C/a letter has been sent

## 2019-12-10 ENCOUNTER — Ambulatory Visit: Payer: Medicare Other | Attending: Internal Medicine

## 2019-12-10 ENCOUNTER — Other Ambulatory Visit: Payer: Self-pay

## 2019-12-10 DIAGNOSIS — Z20822 Contact with and (suspected) exposure to covid-19: Secondary | ICD-10-CM

## 2019-12-11 LAB — NOVEL CORONAVIRUS, NAA: SARS-CoV-2, NAA: DETECTED — AB

## 2019-12-12 ENCOUNTER — Telehealth: Payer: Self-pay | Admitting: Unknown Physician Specialty

## 2019-12-12 ENCOUNTER — Other Ambulatory Visit: Payer: Self-pay | Admitting: Unknown Physician Specialty

## 2019-12-12 DIAGNOSIS — U071 COVID-19: Secondary | ICD-10-CM

## 2019-12-12 NOTE — Telephone Encounter (Signed)
  I connected by phone with Shelly Bennett's daughter [name] on 12/12/2019 at 9:26 AM to discuss the potential use of an new treatment for mild to moderate COVID-19 viral infection in non-hospitalized patients.  This patient is a 68 y.o. female that meets the FDA criteria for Emergency Use Authorization of bamlanivimab or casirivimab\imdevimab.  Has a (+) direct SARS-CoV-2 viral test result  Has mild or moderate COVID-19   Is ? 69 years of age and weighs ? 40 kg  Is NOT hospitalized due to COVID-19  Is NOT requiring oxygen therapy or requiring an increase in baseline oxygen flow rate due to COVID-19  Is within 10 days of symptom onset  Has at least one of the high risk factor(s) for progression to severe COVID-19 and/or hospitalization as defined in EUA.  Specific high risk criteria : >/= 69 yo and co-morbid conditions   I have spoken and communicated the following to the patient or parent/caregiver:  1. FDA has authorized the emergency use of bamlanivimab and casirivimab\imdevimab for the treatment of mild to moderate COVID-19 in adults and pediatric patients with positive results of direct SARS-CoV-2 viral testing who are 66 years of age and older weighing at least 40 kg, and who are at high risk for progressing to severe COVID-19 and/or hospitalization.  2. The significant known and potential risks and benefits of bamlanivimab and casirivimab\imdevimab, and the extent to which such potential risks and benefits are unknown.  3. Information on available alternative treatments and the risks and benefits of those alternatives, including clinical trials.  4. Patients treated with bamlanivimab and casirivimab\imdevimab should continue to self-isolate and use infection control measures (e.g., wear mask, isolate, social distance, avoid sharing personal items, clean and disinfect "high touch" surfaces, and frequent handwashing) according to CDC guidelines.   5. The patient or  parent/caregiver has the option to accept or refuse bamlanivimab or casirivimab\imdevimab .  After reviewing this information with the patient, The patient agreed to proceed with receiving the bamlanimivab infusion and will be provided a copy of the Fact sheet prior to receiving the infusion.[name] 12/12/2019 9:26 AM        Symptom onset 12/22 cough, nausea

## 2019-12-14 ENCOUNTER — Telehealth: Payer: Self-pay | Admitting: Family Medicine

## 2019-12-14 NOTE — Telephone Encounter (Signed)
Patient aware and verbalizes understanding. 

## 2019-12-14 NOTE — Telephone Encounter (Signed)
What symptoms do you have? COVID-19 Positive, cough, coughing clear and bubbly left side pain, she is allergic to codeine, body aches   How long have you been sick? Since 12/09/2019  Have you been seen for this problem? No  If your provider decides to give you a prescription, which pharmacy would you like for it to be sent to?  [facility]   Patient informed that this information will be sent to the clinical staff for review and that they should receive a follow up call.

## 2019-12-14 NOTE — Telephone Encounter (Signed)
Okay I will call their office and speak with them concerning this.

## 2019-12-14 NOTE — Telephone Encounter (Signed)
Recommend evaluation in the ED or at [facility].  Likely needs imaging given unilateral pain.

## 2019-12-15 ENCOUNTER — Ambulatory Visit (HOSPITAL_COMMUNITY)
Admission: RE | Admit: 2019-12-15 | Discharge: 2019-12-15 | Disposition: A | Discharge status: Home / Self Care | Payer: Medicare Other | Source: Ambulatory Visit | Attending: Pulmonary Disease | Admitting: Pulmonary Disease

## 2019-12-15 DIAGNOSIS — Z23 Encounter for immunization: Secondary | ICD-10-CM | POA: Diagnosis not present

## 2019-12-15 DIAGNOSIS — U071 COVID-19: Secondary | ICD-10-CM | POA: Insufficient documentation

## 2019-12-15 MED ORDER — ONDANSETRON HCL 4 MG/2ML IJ SOLN: 4.0000 mg | Freq: Once | INTRAMUSCULAR | Status: DC

## 2019-12-15 MED ORDER — EPINEPHRINE 0.3 MG/0.3ML IJ SOAJ: 0.3000 mg | Freq: Once | INTRAMUSCULAR | Status: DC | PRN

## 2019-12-15 MED ORDER — SODIUM CHLORIDE 0.9 % IV SOLN
700.0000 mg | Freq: Once | INTRAVENOUS | Status: AC
  Administered 2019-12-15: 11:00:00 700 mg via INTRAVENOUS
  Filled 2019-12-15: qty 20

## 2019-12-15 MED ORDER — FAMOTIDINE IN NACL 20-0.9 MG/50ML-% IV SOLN: 20.0000 mg | Freq: Once | INTRAVENOUS | Status: DC | PRN

## 2019-12-15 MED ORDER — ONDANSETRON HCL 4 MG/5ML PO SOLN
4.0000 mg | Freq: Once | ORAL | Status: DC
  Filled 2019-12-15: qty 5

## 2019-12-15 MED ORDER — SODIUM CHLORIDE 0.9 % IV SOLN
INTRAVENOUS | Status: DC | PRN
  Administered 2019-12-15: 250 mL via INTRAVENOUS

## 2019-12-15 MED ORDER — ALBUTEROL SULFATE HFA 108 (90 BASE) MCG/ACT IN AERS: 2.0000 | INHALATION_SPRAY | Freq: Once | RESPIRATORY_TRACT | Status: DC | PRN

## 2019-12-15 MED ORDER — DIPHENHYDRAMINE HCL 50 MG/ML IJ SOLN: 50.0000 mg | Freq: Once | INTRAMUSCULAR | Status: DC | PRN

## 2019-12-15 MED ORDER — ONDANSETRON HCL 4 MG/2ML IJ SOLN
INTRAMUSCULAR | Status: AC
  Administered 2019-12-15: 4 mg
  Filled 2019-12-15: qty 2

## 2019-12-15 MED ORDER — METHYLPREDNISOLONE SODIUM SUCC 125 MG IJ SOLR: 125.0000 mg | Freq: Once | INTRAMUSCULAR | Status: DC | PRN

## 2019-12-15 NOTE — Progress Notes (Signed)
  Diagnosis: COVID-19  Physician: Fransisca Kaufmann. Dettinger, MD  Procedure: [facility] Infusion [facility] Med: bamlanivimab infusion - Provided patient with bamlanimivab fact sheet for patients, parents and caregivers prior to infusion.  Complications: No immediate complications noted.  Discharge: Discharged home   Gaye Alken 12/15/2019

## 2019-12-15 NOTE — Discharge Instructions (Signed)
10 Things You Can Do to Manage Your COVID-19 Symptoms at Home If you have possible or confirmed COVID-19: 1. Stay home from work and school. And stay away from other public places. If you must go out, avoid using any kind of public transportation, ridesharing, or taxis. 2. Monitor your symptoms carefully. If your symptoms get worse, call your healthcare provider immediately. 3. Get rest and stay hydrated. 4. If you have a medical appointment, call the healthcare provider ahead of time and tell them that you have or may have COVID-19. 5. For medical emergencies, call 911 and notify the dispatch personnel that you have or may have COVID-19. 6. Cover your cough and sneezes with a tissue or use the inside of your elbow. 7. Wash your hands often with soap and water for at least 20 seconds or clean your hands with an alcohol-based hand sanitizer that contains at least 60% alcohol. 8. As much as possible, stay in a specific room and away from other people in your home. Also, you should use a separate bathroom, if available. If you need to be around other people in or outside of the home, wear a mask. 9. Avoid sharing personal items with other people in your household, like dishes, towels, and bedding. 10. Clean all surfaces that are touched often, like counters, tabletops, and doorknobs. Use household cleaning sprays or wipes according to the label instructions. cdc.gov/coronavirus 06/17/2019 This information is not intended to replace advice given to you by your health care provider. Make sure you discuss any questions you have with your health care provider. Document Revised: 11/19/2019 Document Reviewed: 11/19/2019 Elsevier Patient Education  2020 Elsevier Inc.  

## 2019-12-16 ENCOUNTER — Other Ambulatory Visit: Payer: Self-pay | Admitting: Physician Assistant

## 2019-12-16 MED ORDER — ONDANSETRON HCL 4 MG PO TABS: 4.0000 mg | ORAL_TABLET | Freq: Three times a day (TID) | ORAL | 1 refills | Status: DC | PRN

## 2019-12-22 ENCOUNTER — Ambulatory Visit: Payer: Medicare Other | Admitting: Neurology

## 2019-12-23 ENCOUNTER — Telehealth: Payer: Self-pay | Admitting: Family Medicine

## 2019-12-23 ENCOUNTER — Other Ambulatory Visit: Payer: Self-pay

## 2019-12-23 ENCOUNTER — Ambulatory Visit (HOSPITAL_BASED_OUTPATIENT_CLINIC_OR_DEPARTMENT_OTHER)
Admission: RE | Admit: 2019-12-23 | Discharge: 2019-12-23 | Disposition: A | Discharge status: Home / Self Care | Payer: Medicare PPO | Source: Ambulatory Visit | Attending: Family Medicine | Admitting: Family Medicine

## 2019-12-23 DIAGNOSIS — J180 Bronchopneumonia, unspecified organism: Secondary | ICD-10-CM

## 2019-12-23 DIAGNOSIS — U071 COVID-19: Secondary | ICD-10-CM | POA: Diagnosis present

## 2019-12-23 DIAGNOSIS — R05 Cough: Secondary | ICD-10-CM

## 2019-12-23 DIAGNOSIS — R059 Cough, unspecified: Secondary | ICD-10-CM

## 2019-12-23 MED ORDER — CEFDINIR 300 MG PO CAPS: 300.0000 mg | ORAL_CAPSULE | Freq: Two times a day (BID) | ORAL | 0 refills | Status: DC

## 2019-12-23 NOTE — Telephone Encounter (Signed)
Patient states that she still has a cough and would like xray ordered for her to get done at [facility].  Please advise

## 2019-12-23 NOTE — Telephone Encounter (Signed)
Patient aware and verbalized understanding. °

## 2019-12-23 NOTE — Telephone Encounter (Signed)
Xray placed- patient aware.

## 2019-12-23 NOTE — Telephone Encounter (Signed)
Go ahead and put in a CXR order for the patient to do, diagnosis covid, cough Shelly Pina, MD [place] 12/23/2019, 12:53 PM

## 2019-12-23 NOTE — Telephone Encounter (Signed)
Patient has residual infiltrate in her x-ray likely from the coronavirus and inflammation but because it also could look like a bronchopneumonia I have sent [place] which is an antibiotic for her to take. Caryl Pina, MD [facility] 12/23/2019, 3:22 PM

## 2019-12-28 ENCOUNTER — Institutional Professional Consult (permissible substitution): Payer: Medicare Other | Admitting: Neurology

## 2020-01-15 ENCOUNTER — Other Ambulatory Visit: Payer: Self-pay

## 2020-01-15 ENCOUNTER — Telehealth: Payer: Self-pay | Admitting: Family Medicine

## 2020-01-18 ENCOUNTER — Encounter: Payer: Self-pay | Admitting: Family Medicine

## 2020-01-18 ENCOUNTER — Ambulatory Visit: Payer: Medicare Other | Admitting: Family Medicine

## 2020-01-18 ENCOUNTER — Ambulatory Visit (INDEPENDENT_AMBULATORY_CARE_PROVIDER_SITE_OTHER): Payer: Medicare PPO

## 2020-01-18 VITALS — BP 141/75 | HR 61 | Temp 98.6°F | Ht 68.0 in | Wt 200.8 lb

## 2020-01-18 DIAGNOSIS — J4521 Mild intermittent asthma with (acute) exacerbation: Secondary | ICD-10-CM

## 2020-01-18 DIAGNOSIS — J45998 Other asthma: Secondary | ICD-10-CM | POA: Diagnosis not present

## 2020-01-18 MED ORDER — ROSUVASTATIN CALCIUM 20 MG PO TABS: 20.0000 mg | ORAL_TABLET | Freq: Every day | ORAL | 3 refills | Status: DC

## 2020-01-18 MED ORDER — FLUTICASONE PROPIONATE 50 MCG/ACT NA SUSP: 2.0000 | Freq: Every day | NASAL | 3 refills | Status: DC

## 2020-01-18 MED ORDER — DEXAMETHASONE 2 MG PO TABS: ORAL_TABLET | ORAL | 0 refills | Status: DC

## 2020-01-18 MED ORDER — BUPROPION HCL ER (XL) 150 MG PO TB24: 150.0000 mg | ORAL_TABLET | Freq: Every day | ORAL | 3 refills | Status: DC

## 2020-01-18 MED ORDER — ALBUTEROL SULFATE HFA 108 (90 BASE) MCG/ACT IN AERS: 2.0000 | INHALATION_SPRAY | Freq: Four times a day (QID) | RESPIRATORY_TRACT | 1 refills | Status: DC | PRN

## 2020-01-18 NOTE — Progress Notes (Signed)
BP (!) 141/75   Pulse 61   Temp 98.6 F (37 C) (Temporal)   Ht 5\' 8"  (1.727 m)   Wt 200 lb 12.8 oz (91.1 kg)   SpO2 98%   BMI 30.53 kg/m    Subjective:   Patient ID: Shelly Bennett, female    DOB: 1951/06/26, 69 y.o.   MRN: [id number]  HPI: Shelly Bennett is a 69 y.o. female presenting on 01/18/2020 for balance disorder (4 week follow up), nasal drainage (x 4 weeks), and Cough   HPI Patient is coming in with cough and nasal drainage and sinus pressure and congestion that still bothering her over the past month and a half since she was diagnosed with Covid and she just not recovering at this point.  She says she does not have any fevers or chills or body aches or any the other symptoms that she had she denies any shortness of breath or wheezing.  She says it was around Christmas that she was diagnosed with Covid.  She has been using Delsym and Flonase and feels like it helped a little bit but it is really just not getting better overall.  Relevant past medical, surgical, family and social history reviewed and updated as indicated. Interim medical history since our last visit reviewed. Allergies and medications reviewed and updated.  Review of Systems  Constitutional: Negative for chills and fever.  HENT: Positive for congestion and postnasal drip. Negative for ear discharge, ear pain, rhinorrhea, sinus pressure, sneezing and sore throat.   Eyes: Negative for pain, redness and visual disturbance.  Respiratory: Positive for cough. Negative for chest tightness and shortness of breath.   Cardiovascular: Negative for chest pain and leg swelling.  Musculoskeletal: Negative for back pain, gait problem and myalgias.  Skin: Negative for rash.  Neurological: Negative for weakness, light-headedness and headaches.  Psychiatric/Behavioral: Negative for agitation and behavioral problems.  All other systems reviewed and are negative.   Per HPI unless specifically indicated  above   Allergies as of 01/18/2020      Reactions   Codeine    ? Reaction ER visit.    Mold Extract [trichophyton]    Migraine   Penicillins Other (See Comments)   "drew my legs up as child" Pt has tolerated cephalexin in the past.   Ace Inhibitors Cough   Angiotensin Receptor Blockers Cough   Levaquin [levofloxacin] Rash   Per notes from outpatient provider   Vytorin [ezetimibe-simvastatin] Other (See Comments)   cramping   Zetia [ezetimibe] Other (See Comments)   cramping      Medication List       Accurate as of January 18, 2020  2:37 PM. If you have any questions, ask your nurse or doctor.        STOP taking these medications   cefdinir 300 MG capsule Commonly known as: OMNICEF Stopped by: Fransisca Kaufmann Braleigh Massoud, MD     TAKE these medications   albuterol 108 (90 Base) MCG/ACT inhaler Commonly known as: VENTOLIN HFA Inhale 2 puffs into the lungs every 6 (six) hours as needed for wheezing or shortness of breath. Started by: Fransisca Kaufmann Valora Norell, MD   amLODipine 10 MG tablet Commonly known as: NORVASC TAKE (1/2) TABLET DAILY.   aspirin 81 MG tablet Take 81 mg by mouth daily.   buPROPion 150 MG 24 hr tablet Commonly known as: Wellbutrin XL Take 1 tablet (150 mg total) by mouth daily.   CALCARB 600 PO Take by mouth.   dexamethasone  2 MG tablet Commonly known as: DECADRON Take 4 tablets for 3 days then 3 tablets for 3 days then 2 tablets for 3 days then 1 tablet for 3 days Started by: Fransisca Kaufmann Aksh Swart, MD   fexofenadine 180 MG tablet Commonly known as: ALLEGRA Take 180 mg by mouth daily.   fluticasone 50 MCG/ACT nasal spray Commonly known as: FLONASE Place 2 sprays into both nostrils daily.   hydrochlorothiazide 25 MG tablet Commonly known as: HYDRODIURIL TAKE (1/2) TABLET DAILY.   montelukast 10 MG tablet Commonly known as: SINGULAIR Take 1 tablet (10 mg total) by mouth at bedtime.   omega-3 acid ethyl esters 1 g capsule Commonly known as:  LOVAZA TAKE (1) CAPSULE FOUR TIMES DAILY.   ondansetron 4 MG tablet Commonly known as: Zofran Take 1 tablet (4 mg total) by mouth every 8 (eight) hours as needed for nausea or vomiting.   rosuvastatin 20 MG tablet Commonly known as: CRESTOR Take 1 tablet (20 mg total) by mouth daily.   Vitamin D-3 125 MCG (5000 UT) Tabs Take 1 capsule by mouth as directed. Take one capsule by mouth daily Mon thru Friday        Objective:   BP (!) 141/75   Pulse 61   Temp 98.6 F (37 C) (Temporal)   Ht 5\' 8"  (1.727 m)   Wt 200 lb 12.8 oz (91.1 kg)   SpO2 98%   BMI 30.53 kg/m   Wt Readings from Last 3 Encounters:  01/18/20 200 lb 12.8 oz (91.1 kg)  11/23/19 209 lb 6.4 oz (95 kg)  10/19/19 212 lb 9.6 oz (96.4 kg)    Physical Exam Vitals and nursing note reviewed.  Constitutional:      General: She is not in acute distress.    Appearance: She is well-developed. She is not diaphoretic.  HENT:     Mouth/Throat:     Mouth: Mucous membranes are moist.     Pharynx: Oropharynx is clear.  Eyes:     Conjunctiva/sclera: Conjunctivae normal.  Cardiovascular:     Rate and Rhythm: Normal rate and regular rhythm.     Heart sounds: Normal heart sounds. No murmur.  Pulmonary:     Effort: Pulmonary effort is normal. No respiratory distress.     Breath sounds: Normal breath sounds. No wheezing.  Musculoskeletal:        General: No tenderness. Normal range of motion.  Skin:    General: Skin is warm and dry.     Findings: No rash.  Neurological:     Mental Status: She is alert and oriented to person, place, and time.     Coordination: Coordination normal.  Psychiatric:        Behavior: Behavior normal.     Chest x-ray: Clear bilaterally, await final read from radiologist  Assessment & Plan:   Problem List Items Addressed This Visit    None    Visit Diagnoses    Post viral asthma    -  Primary   Relevant Medications   albuterol (VENTOLIN HFA) 108 (90 Base) MCG/ACT inhaler    dexamethasone (DECADRON) 2 MG tablet   Other Relevant Orders   DG Chest 2 View   Mild intermittent reactive airway disease with acute exacerbation       Relevant Medications   albuterol (VENTOLIN HFA) 108 (90 Base) MCG/ACT inhaler   dexamethasone (DECADRON) 2 MG tablet   Other Relevant Orders   DG Chest 2 View      Still sounds  like is a post Covid syndrome chest x-ray looks normal, will give dexamethasone and albuterol and continue Flonase and Delsym.  Also recommended possibly getting a humidifier. Follow up plan: Return in about 4 months (around 05/17/2020), or if symptoms worsen or fail to improve, for Chronic recheck.  Counseling provided for all of the vaccine components Orders Placed This Encounter  Procedures  . DG Chest 2 View    Caryl Pina, MD [facility] 01/18/2020, 2:37 PM

## 2020-01-21 ENCOUNTER — Other Ambulatory Visit: Payer: Self-pay

## 2020-01-21 ENCOUNTER — Encounter: Payer: Self-pay | Admitting: Physical Therapy

## 2020-01-21 ENCOUNTER — Ambulatory Visit: Payer: Medicare PPO | Attending: Family Medicine | Admitting: Physical Therapy

## 2020-01-21 DIAGNOSIS — I10 Essential (primary) hypertension: Secondary | ICD-10-CM | POA: Diagnosis not present

## 2020-01-21 DIAGNOSIS — E785 Hyperlipidemia, unspecified: Secondary | ICD-10-CM | POA: Diagnosis not present

## 2020-01-21 DIAGNOSIS — R279 Unspecified lack of coordination: Secondary | ICD-10-CM | POA: Diagnosis not present

## 2020-01-21 DIAGNOSIS — R296 Repeated falls: Secondary | ICD-10-CM | POA: Insufficient documentation

## 2020-01-21 DIAGNOSIS — R2681 Unsteadiness on feet: Secondary | ICD-10-CM | POA: Insufficient documentation

## 2020-01-21 DIAGNOSIS — M810 Age-related osteoporosis without current pathological fracture: Secondary | ICD-10-CM | POA: Diagnosis not present

## 2020-01-21 DIAGNOSIS — M6281 Muscle weakness (generalized): Secondary | ICD-10-CM

## 2020-01-21 DIAGNOSIS — I2510 Atherosclerotic heart disease of native coronary artery without angina pectoris: Secondary | ICD-10-CM | POA: Insufficient documentation

## 2020-01-21 DIAGNOSIS — R011 Cardiac murmur, unspecified: Secondary | ICD-10-CM | POA: Diagnosis not present

## 2020-01-21 NOTE — Therapy (Signed)
[facility] [place], [place], [address] Phone: [phone]   Fax:  [phone]  Physical Therapy Treatment  Patient Details  Name: Shelly Bennett MRN: [id number] Date of Birth: [birth year] Referring Provider (PT): Caryl Pina, MD    Encounter Date: 01/21/2020  PT End of Session - 01/21/20 0954    Visit Number  6    Number of Visits  18    Date for PT Re-Evaluation  02/26/20    Authorization Type  progress note every 10th visit, KX modifier at 15th visit    PT Start Time  0948    PT Stop Time  1033    PT Time Calculation (min)  45 min    Activity Tolerance  Patient tolerated treatment well    Behavior During Therapy  [facility] for tasks assessed/performed       Past Medical History:  Diagnosis Date  . Adenomatous colon polyp   . Allergic drug reaction 06/25/2015  . Allergy   . Anxiety   . Arthritis   . At risk for falls    fallen 4 times recently   . CAD (coronary artery disease)    Dr. Burt Knack follows   . Carpal tunnel syndrome   . Chronic headaches   . Coccydynia 02/01/2012  . Cystocele   . Diverticulosis   . External hemorrhoids   . Focal nodular hyperplasia of liver   . GERD (gastroesophageal reflux disease)    some  . HCAP (healthcare-associated pneumonia) 06/25/2015  . Heart murmur   . HLD (hyperlipidemia)   . HTN (hypertension)   . IBS (irritable bowel syndrome)   . Memory loss   . Neuromuscular disorder ([place])    some neuropathy issues in the past- legs fibromyalgia in past-- past hx of steroid injections  . Obesity   . Osteoporosis   . Otitis of left ear 02/01/2017  . Pneumonia   . Skin cancer    squamous cell - face    Past Surgical History:  Procedure Laterality Date  . CARPAL TUNNEL RELEASE    . COLONOSCOPY  12/15/2008   diverticulosis, external hemorrhoids  . KNEE SURGERY  05/22/2015   right  . LAPAROSCOPY    . SKIN SURGERY     squamous cell - right face     There were no vitals  filed for this visit.  Subjective Assessment - 01/21/20 0953    Subjective  COVID-19 screening performed upon arrival. Patient arrives feeling "alright". Patient has been out due to COVID-19. Patient reports feeling weak.    Pertinent History  HTN, heart murmur, IBS, neuropathy    Limitations  Standing;House hold activities    Diagnostic tests  MRI: normal results per patient    Patient Stated Goals  improve balance         [facility] PT Assessment - 01/21/20 0001      Assessment   Medical Diagnosis  Balance disorder, fall    Referring Provider (PT)  Caryl Pina, MD    Next MD Visit  February 2021    Prior Therapy  yes      Precautions   Precautions  Fall      Restrictions   Weight Bearing Restrictions  No      Strength   Right Knee Flexion  4/5    Right Knee Extension  4/5    Left Knee Flexion  4/5    Left Knee Extension  4/5    Right Ankle Dorsiflexion  4+/5  Right Ankle Inversion  4+/5    Right Ankle Eversion  4+/5    Left Ankle Dorsiflexion  4+/5    Left Ankle Inversion  4+/5    Left Ankle Eversion  4+/5      Berg Balance Test   Sit to Stand  Able to stand  independently using hands    Standing Unsupported  Able to stand 2 minutes with supervision    Sitting with Back Unsupported but Feet Supported on Floor or Stool  Able to sit safely and securely 2 minutes    Stand to Sit  Controls descent by using hands    Transfers  Able to transfer safely, definite need of hands    Standing Unsupported with Eyes Closed  Able to stand 10 seconds with supervision    Standing Unsupported with Feet Together  Able to place feet together independently and stand for 1 minute with supervision    From Standing, Reach Forward with Outstretched Arm  Can reach confidently >25 cm (10")    From Standing Position, Pick up Object from Floor  Able to pick up shoe, needs supervision    From Standing Position, Turn to Look Behind Over each Shoulder  Turn sideways only but maintains balance     Turn 360 Degrees  Able to turn 360 degrees safely but slowly    Standing Unsupported, Alternately Place Feet on Step/Stool  Able to stand independently and complete 8 steps >20 seconds    Standing Unsupported, One Foot in Front  Needs help to step but can hold 15 seconds    Standing on One Leg  Tries to lift leg/unable to hold 3 seconds but remains standing independently    Total Score  38                   [facility] Adult PT Treatment/Exercise - 01/21/20 0001      Exercises   Exercises  Lumbar      Lumbar Exercises: Aerobic   Nustep  Level 2 x10 mins      Lumbar Exercises: Seated   Long Arc Quad on Chair  Strengthening;Both;10 reps;2 sets;Weights    LAQ on Chair Weights (lbs)  2    Other Seated Lumbar Exercises  B clam red theraband x20 reps marching x20 2#     Other Seated Lumbar Exercises  horizontal abduction x20 rows x20 Red theraband      Lumbar Exercises: Supine   Ab Set  20 reps;3 seconds   2x20   Bent Knee Raise  20 reps;2 seconds    Bridge  Compliant;10 reps               PT Short Term Goals - 11/24/19 1312      PT SHORT TERM GOAL #1   Title  STG=LTG        PT Long Term Goals - 01/21/20 1256      PT LONG TERM GOAL #1   Title  Patient will be independent with HEP    Time  6    Period  Weeks    Status  On-going      PT LONG TERM GOAL #2   Title  Patient will perform modified 5x sit to stand in 22 seconds or less to reduce risk of falls.    Time  6    Period  Weeks    Status  On-going      PT LONG TERM GOAL #3   Title  Patient will improve  LE MMT to 4+/5 or greater to improve stability during functional tasks.    Time  6    Status  On-going      PT LONG TERM GOAL #4   Title  Patient will improve Berg Balance scale by 4 points or greater to decrease risk of falls.    Time  6    Period  Weeks    Status  On-going            Plan - 01/21/20 1038    Clinical Impression Statement  Patient returns to physical therapy after a hiatus  due to COVID-19. Patient was able to tolerate exercises with minimal reports of pain and fatigue. Patient's Berg Balance Score of 38/56 categorizes her as a moderate fall risk. Patient and PT discussed slow progression of TEs and balance. Patient would benefit from continued skilled PT to address goals.    Personal Factors and Comorbidities  Comorbidity 1;Time since onset of injury/illness/exacerbation    Comorbidities  HTN, heart murmur; bilateral knee scopes, chronic back pain with sciatica and LE weakness    Stability/Clinical Decision Making  Stable/Uncomplicated    Clinical Decision Making  Low    Rehab Potential  Fair    PT Frequency  2x / week    PT Duration  6 weeks    PT Treatment/Interventions  ADLs/Self Care Home Management;Biofeedback;Cryotherapy;Software engineer;Therapeutic activities;Therapeutic exercise;Balance training;Neuromuscular re-education;Patient/family education;Manual techniques;Passive range of motion;Dry needling;Taping    PT Next Visit Plan  continue with Nustep, balance activities, LE strengthening in various positions.    PT Home Exercise Plan  see patient education    Consulted and Agree with Plan of Care  Patient       Patient will benefit from skilled therapeutic intervention in order to improve the following deficits and impairments:  Abnormal gait, Pain, Postural dysfunction, Decreased strength, Decreased coordination, Impaired tone, Decreased range of motion, Decreased balance  Visit Diagnosis: Unsteadiness on feet  Repeated falls  Muscle weakness (generalized)  Unspecified lack of coordination     Problem List Patient Active Problem List   Diagnosis Date Noted  . Gastroesophageal reflux disease 10/19/2019  . Hypertensive disorder 10/19/2019  . Third degree uterine prolapse 10/19/2019  . Spinal stenosis of lumbar region without neurogenic claudication 09/15/2019  . Urinary and fecal incontinence 09/15/2019  .  Cystocele with rectocele 09/15/2019  . Radial styloid tenosynovitis (de quervain) 06/24/2019  . Trigger finger, right middle finger 06/24/2019  . Pain in left wrist 05/27/2019  . Trigger thumb, right thumb 10/29/2018  . Memory loss 07/22/2018  . Low back pain 07/22/2018  . Left lumbar radiculopathy 01/22/2018  . Mild cognitive impairment 10/22/2017  . Anxiety 10/22/2017  . Aortic atherosclerosis ([place]) 12/04/2016  . Ptosis of right eyelid 02/01/2016  . Dermatochalasis of both upper eyelids 03/07/2015  . Vitamin D deficiency 08/19/2014  . 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 Family history of colon cancer-father at 83+ grandparent w/ rectal cancer 01/14/2014  . MGD (meibomian gland dysfunction) 10/14/2013  . Injection of surface of eye 10/14/2013  . Osteopenia 08/12/2013  . CAD (coronary artery disease), native coronary artery 03/22/2013  . Aortic valve stenosis, mild 03/22/2013  . Hyperlipemia 03/11/2013  . Hypertension 03/11/2013  . Allergic rhinitis 03/11/2013  . Left shoulder pain 03/11/2013  . IBS (irritable bowel syndrome) - with chronic recurrent abdominal pain 02/01/2012  . Personal history of colonic polyps - adenoma 02/01/2012    Gabriela Eves, PT, DPT 01/21/2020, 12:57  PM  [facility] [place], [place], [address] Phone: [phone]   Fax:  [phone]  Name: Shelly Bennett MRN: [id number] Date of Birth: 1951/05/30

## 2020-01-26 ENCOUNTER — Encounter: Payer: Self-pay | Admitting: Physical Therapy

## 2020-01-26 ENCOUNTER — Ambulatory Visit: Payer: Medicare PPO | Admitting: Physical Therapy

## 2020-01-26 ENCOUNTER — Other Ambulatory Visit: Payer: Self-pay

## 2020-01-26 DIAGNOSIS — R296 Repeated falls: Secondary | ICD-10-CM

## 2020-01-26 DIAGNOSIS — R2681 Unsteadiness on feet: Secondary | ICD-10-CM

## 2020-01-26 DIAGNOSIS — R279 Unspecified lack of coordination: Secondary | ICD-10-CM

## 2020-01-26 DIAGNOSIS — M6281 Muscle weakness (generalized): Secondary | ICD-10-CM

## 2020-01-26 NOTE — Therapy (Signed)
[facility] [place], [place], [address] Phone: [phone]   Fax:  [phone]  Physical Therapy Treatment  Patient Details  Name: Shelly Bennett MRN: [id number] Date of Birth: [birth year] Referring Provider (PT): Caryl Pina, MD   Encounter Date: 01/26/2020  PT End of Session - 01/26/20 1044    Visit Number  7    Number of Visits  18    Date for PT Re-Evaluation  02/26/20    Authorization Type  progress note every 10th visit, KX modifier at 15th visit    PT Start Time  1041    PT Stop Time  1115    PT Time Calculation (min)  34 min    Activity Tolerance  Patient tolerated treatment well    Behavior During Therapy  [facility] for tasks assessed/performed       Past Medical History:  Diagnosis Date  . Adenomatous colon polyp   . Allergic drug reaction 06/25/2015  . Allergy   . Anxiety   . Arthritis   . At risk for falls    fallen 4 times recently   . CAD (coronary artery disease)    Dr. Burt Knack follows   . Carpal tunnel syndrome   . Chronic headaches   . Coccydynia 02/01/2012  . Cystocele   . Diverticulosis   . External hemorrhoids   . Focal nodular hyperplasia of liver   . GERD (gastroesophageal reflux disease)    some  . HCAP (healthcare-associated pneumonia) 06/25/2015  . Heart murmur   . HLD (hyperlipidemia)   . HTN (hypertension)   . IBS (irritable bowel syndrome)   . Memory loss   . Neuromuscular disorder ([place])    some neuropathy issues in the past- legs fibromyalgia in past-- past hx of steroid injections  . Obesity   . Osteoporosis   . Otitis of left ear 02/01/2017  . Pneumonia   . Skin cancer    squamous cell - face    Past Surgical History:  Procedure Laterality Date  . CARPAL TUNNEL RELEASE    . COLONOSCOPY  12/15/2008   diverticulosis, external hemorrhoids  . KNEE SURGERY  05/22/2015   right  . LAPAROSCOPY    . SKIN SURGERY     squamous cell - right face     There were no vitals  filed for this visit.  Subjective Assessment - 01/26/20 1042    Subjective  COVID-19 screening performed upon arrival. Patient reports fear of falling and difficulty getting up from kneeling or squatting. Goes to see rheumatologist soon.    Pertinent History  HTN, heart murmur, IBS, neuropathy    Limitations  Standing;House hold activities    Diagnostic tests  MRI: normal results per patient    Patient Stated Goals  improve balance    Currently in Pain?  Yes    Pain Score  2     Pain Location  Knee    Pain Orientation  Left    Pain Descriptors / Indicators  Discomfort    Pain Type  Acute pain    Pain Onset  More than a month ago    Pain Frequency  Intermittent         [facility] PT Assessment - 01/26/20 0001      Assessment   Medical Diagnosis  Balance disorder, fall    Referring Provider (PT)  Caryl Pina, MD    Next MD Visit  February 2021    Prior Therapy  yes  Precautions   Precautions  Fall      Restrictions   Weight Bearing Restrictions  No                   [facility] Adult PT Treatment/Exercise - 01/26/20 0001      Exercises   Exercises  Knee/Hip      Lumbar Exercises: Aerobic   Nustep  --      Knee/Hip Exercises: Aerobic   Nustep  L2 x15 min      Knee/Hip Exercises: Standing   Heel Raises  Both;20 reps    Heel Raises Limitations  B toe raise x20 reps      Knee/Hip Exercises: Seated   Long Arc Quad  Strengthening;Both;2 sets;10 reps;Weights    Long Arc Quad Weight  3 lbs.    Clamshell with TheraBand  Red   3x10 reps   Hamstring Curl  Strengthening;Both;2 sets;10 reps    Hamstring Limitations  red theraband    Sit to Sand  10 reps;without UE support      Knee/Hip Exercises: Supine   Bridges  Strengthening;Both;20 reps    Bridges Limitations  feet on dynadisc for core    Straight Leg Raises  Strengthening;Both;20 reps               PT Short Term Goals - 11/24/19 1312      PT SHORT TERM GOAL #1   Title  STG=LTG         PT Long Term Goals - 01/21/20 1256      PT LONG TERM GOAL #1   Title  Patient will be independent with HEP    Time  6    Period  Weeks    Status  On-going      PT LONG TERM GOAL #2   Title  Patient will perform modified 5x sit to stand in 22 seconds or less to reduce risk of falls.    Time  6    Period  Weeks    Status  On-going      PT LONG TERM GOAL #3   Title  Patient will improve LE MMT to 4+/5 or greater to improve stability during functional tasks.    Time  6    Status  On-going      PT LONG TERM GOAL #4   Title  Patient will improve Berg Balance scale by 4 points or greater to decrease risk of falls.    Time  6    Period  Weeks    Status  On-going            Plan - 01/26/20 1155    Clinical Impression Statement  Patient presented in clinic with reports of generalized fatigue since recovering from [place]- 19. Patient also experiencing fear of falling due to her past history. Patient noted during strengthening therex of greater weakness in RLE. By end of therex patient reported fatigue of BLEs.    Personal Factors and Comorbidities  Comorbidity 1;Time since onset of injury/illness/exacerbation    Comorbidities  HTN, heart murmur; bilateral knee scopes, chronic back pain with sciatica and LE weakness    Stability/Clinical Decision Making  Stable/Uncomplicated    Rehab Potential  Fair    PT Frequency  2x / week    PT Duration  6 weeks    PT Treatment/Interventions  ADLs/Self Care Home Management;Biofeedback;Cryotherapy;Software engineer;Therapeutic activities;Therapeutic exercise;Balance training;Neuromuscular re-education;Patient/family education;Manual techniques;Passive range of motion;Dry needling;Taping    PT Next Visit Plan  continue with Nustep, balance activities, LE strengthening in various positions.    PT Home Exercise Plan  see patient education    Consulted and Agree with Plan of Care  Patient       Patient will  benefit from skilled therapeutic intervention in order to improve the following deficits and impairments:  Abnormal gait, Pain, Postural dysfunction, Decreased strength, Decreased coordination, Impaired tone, Decreased range of motion, Decreased balance  Visit Diagnosis: Unsteadiness on feet  Repeated falls  Muscle weakness (generalized)  Unspecified lack of coordination     Problem List Patient Active Problem List   Diagnosis Date Noted  . Gastroesophageal reflux disease 10/19/2019  . Hypertensive disorder 10/19/2019  . Third degree uterine prolapse 10/19/2019  . Spinal stenosis of lumbar region without neurogenic claudication 09/15/2019  . Urinary and fecal incontinence 09/15/2019  . Cystocele with rectocele 09/15/2019  . Radial styloid tenosynovitis (de quervain) 06/24/2019  . Trigger finger, right middle finger 06/24/2019  . Pain in left wrist 05/27/2019  . Trigger thumb, right thumb 10/29/2018  . Memory loss 07/22/2018  . Low back pain 07/22/2018  . Left lumbar radiculopathy 01/22/2018  . Mild cognitive impairment 10/22/2017  . Anxiety 10/22/2017  . Aortic atherosclerosis ([place]) 12/04/2016  . Ptosis of right eyelid 02/01/2016  . Dermatochalasis of both upper eyelids 03/07/2015  . Vitamin D deficiency 08/19/2014  . 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 Family history of colon cancer-father at 47+ grandparent w/ rectal cancer 01/14/2014  . MGD (meibomian gland dysfunction) 10/14/2013  . Injection of surface of eye 10/14/2013  . Osteopenia 08/12/2013  . CAD (coronary artery disease), native coronary artery 03/22/2013  . Aortic valve stenosis, mild 03/22/2013  . Hyperlipemia 03/11/2013  . Hypertension 03/11/2013  . Allergic rhinitis 03/11/2013  . Left shoulder pain 03/11/2013  . IBS (irritable bowel syndrome) - with chronic recurrent abdominal pain 02/01/2012  . Personal history of colonic polyps - adenoma 02/01/2012    Standley Brooking, PTA 01/26/2020, 11:57 AM  [facility] [address] [place], [place], [address] Phone: [phone]   Fax:  [phone]  Name: Shelly Bennett MRN: [id number] Date of Birth: 04-24-1951

## 2020-01-28 ENCOUNTER — Encounter: Payer: Self-pay | Admitting: Physical Therapy

## 2020-01-28 ENCOUNTER — Other Ambulatory Visit: Payer: Self-pay

## 2020-01-28 ENCOUNTER — Ambulatory Visit: Payer: Medicare PPO | Admitting: Physical Therapy

## 2020-01-28 DIAGNOSIS — M6281 Muscle weakness (generalized): Secondary | ICD-10-CM | POA: Diagnosis not present

## 2020-01-28 DIAGNOSIS — R296 Repeated falls: Secondary | ICD-10-CM

## 2020-01-28 DIAGNOSIS — R279 Unspecified lack of coordination: Secondary | ICD-10-CM

## 2020-01-28 DIAGNOSIS — R2681 Unsteadiness on feet: Secondary | ICD-10-CM

## 2020-01-28 NOTE — Therapy (Signed)
[facility] [place], [place], [address] Phone: [phone]   Fax:  [phone]  Physical Therapy Treatment  Patient Details  Name: Shelly Bennett MRN: [id number] Date of Birth: Oct 10, 1951 Referring Provider (PT): Caryl Pina, MD   Encounter Date: 01/28/2020  PT End of Session - 01/28/20 1047    Visit Number  8    Number of Visits  18    Date for PT Re-Evaluation  02/26/20    Authorization Type  progress note every 10th visit, KX modifier at 15th visit    PT Start Time  1032    PT Stop Time  1114    PT Time Calculation (min)  42 min    Activity Tolerance  Patient tolerated treatment well    Behavior During Therapy  [facility] for tasks assessed/performed       Past Medical History:  Diagnosis Date  . Adenomatous colon polyp   . Allergic drug reaction 06/25/2015  . Allergy   . Anxiety   . Arthritis   . At risk for falls    fallen 4 times recently   . CAD (coronary artery disease)    Dr. Burt Knack follows   . Carpal tunnel syndrome   . Chronic headaches   . Coccydynia 02/01/2012  . Cystocele   . Diverticulosis   . External hemorrhoids   . Focal nodular hyperplasia of liver   . GERD (gastroesophageal reflux disease)    some  . HCAP (healthcare-associated pneumonia) 06/25/2015  . Heart murmur   . HLD (hyperlipidemia)   . HTN (hypertension)   . IBS (irritable bowel syndrome)   . Memory loss   . Neuromuscular disorder ([place])    some neuropathy issues in the past- legs fibromyalgia in past-- past hx of steroid injections  . Obesity   . Osteoporosis   . Otitis of left ear 02/01/2017  . Pneumonia   . Skin cancer    squamous cell - face    Past Surgical History:  Procedure Laterality Date  . CARPAL TUNNEL RELEASE    . COLONOSCOPY  12/15/2008   diverticulosis, external hemorrhoids  . KNEE SURGERY  05/22/2015   right  . LAPAROSCOPY    . SKIN SURGERY     squamous cell - right face     There were no vitals  filed for this visit.  Subjective Assessment - 01/28/20 1044    Subjective  COVID 19 screening performed on patient upon arrival. Patient reports pulling her R groin muscle this morning but no other complaints.    Pertinent History  HTN, heart murmur, IBS, neuropathy    Limitations  Standing;House hold activities    Diagnostic tests  MRI: normal results per patient    Patient Stated Goals  improve balance    Currently in Pain?  No/denies         [facility] PT Assessment - 01/28/20 0001      Assessment   Medical Diagnosis  Balance disorder, fall    Referring Provider (PT)  Caryl Pina, MD    Next MD Visit  February 2021    Prior Therapy  yes      Precautions   Precautions  Fall      Restrictions   Weight Bearing Restrictions  No                   [facility] Adult PT Treatment/Exercise - 01/28/20 0001      Knee/Hip Exercises: Aerobic   Nustep  L4  x16 min      Knee/Hip Exercises: Standing   Heel Raises  Both;20 reps    Heel Raises Limitations  B toe raise x20 reps    Hip Flexion  AROM;Both;20 reps;Knee bent    Hip Abduction  AROM;Both;20 reps;Knee straight      Knee/Hip Exercises: Seated   Long Arc Quad  Strengthening;Both;3 sets;10 reps;Weights    Long Arc Quad Weight  4 lbs.    Clamshell with TheraBand  Red   2x10 reps   Sit to Sand  20 reps;without UE support      Knee/Hip Exercises: Supine   Bridges  Strengthening;Both;20 reps    Bridges Limitations  feet on dynadisc for core    Bridges with [employer]  Strengthening;Both;20 reps    Straight Leg Raises  Strengthening;Both;20 reps               PT Short Term Goals - 11/24/19 1312      PT SHORT TERM GOAL #1   Title  STG=LTG        PT Long Term Goals - 01/21/20 1256      PT LONG TERM GOAL #1   Title  Patient will be independent with HEP    Time  6    Period  Weeks    Status  On-going      PT LONG TERM GOAL #2   Title  Patient will perform modified 5x sit to stand in 22 seconds or  less to reduce risk of falls.    Time  6    Period  Weeks    Status  On-going      PT LONG TERM GOAL #3   Title  Patient will improve LE MMT to 4+/5 or greater to improve stability during functional tasks.    Time  6    Status  On-going      PT LONG TERM GOAL #4   Title  Patient will improve Berg Balance scale by 4 points or greater to decrease risk of falls.    Time  6    Period  Weeks    Status  On-going            Plan - 01/28/20 1148    Clinical Impression Statement  Patient tolerated today's treatment fairly well although she fatigues with activity. Patient reports lacking confidence and continues to have fear of falling. Core strengthening incorporated today as to improve core stability to prevent falls. Patient reported LE weakness and shaking upon end of treatment.    Personal Factors and Comorbidities  Comorbidity 1;Time since onset of injury/illness/exacerbation    Comorbidities  HTN, heart murmur; bilateral knee scopes, chronic back pain with sciatica and LE weakness    Stability/Clinical Decision Making  Stable/Uncomplicated    Rehab Potential  Fair    PT Frequency  2x / week    PT Duration  6 weeks    PT Treatment/Interventions  ADLs/Self Care Home Management;Biofeedback;Cryotherapy;Software engineer;Therapeutic activities;Therapeutic exercise;Balance training;Neuromuscular re-education;Patient/family education;Manual techniques;Passive range of motion;Dry needling;Taping    PT Next Visit Plan  continue with Nustep, balance activities, LE strengthening in various positions.    PT Home Exercise Plan  see patient education    Consulted and Agree with Plan of Care  Patient       Patient will benefit from skilled therapeutic intervention in order to improve the following deficits and impairments:  Abnormal gait, Pain, Postural dysfunction, Decreased strength, Decreased coordination, Impaired tone, Decreased range of  motion, Decreased  balance  Visit Diagnosis: Unsteadiness on feet  Repeated falls  Muscle weakness (generalized)  Unspecified lack of coordination     Problem List Patient Active Problem List   Diagnosis Date Noted  . Gastroesophageal reflux disease 10/19/2019  . Hypertensive disorder 10/19/2019  . Third degree uterine prolapse 10/19/2019  . Spinal stenosis of lumbar region without neurogenic claudication 09/15/2019  . Urinary and fecal incontinence 09/15/2019  . Cystocele with rectocele 09/15/2019  . Radial styloid tenosynovitis (de quervain) 06/24/2019  . Trigger finger, right middle finger 06/24/2019  . Pain in left wrist 05/27/2019  . Trigger thumb, right thumb 10/29/2018  . Memory loss 07/22/2018  . Low back pain 07/22/2018  . Left lumbar radiculopathy 01/22/2018  . Mild cognitive impairment 10/22/2017  . Anxiety 10/22/2017  . Aortic atherosclerosis ([place]) 12/04/2016  . Ptosis of right eyelid 02/01/2016  . Dermatochalasis of both upper eyelids 03/07/2015  . Vitamin D deficiency 08/19/2014  . 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 Family history of colon cancer-father at 89+ grandparent w/ rectal cancer 01/14/2014  . MGD (meibomian gland dysfunction) 10/14/2013  . Injection of surface of eye 10/14/2013  . Osteopenia 08/12/2013  . CAD (coronary artery disease), native coronary artery 03/22/2013  . Aortic valve stenosis, mild 03/22/2013  . Hyperlipemia 03/11/2013  . Hypertension 03/11/2013  . Allergic rhinitis 03/11/2013  . Left shoulder pain 03/11/2013  . IBS (irritable bowel syndrome) - with chronic recurrent abdominal pain 02/01/2012  . Personal history of colonic polyps - adenoma 02/01/2012    Standley Brooking, PTA 01/28/2020, 12:02 PM  [facility] [address] [place], [place], [address] Phone: [phone]   Fax:  [phone]  Name: Shelly Bennett MRN: [id number] Date of Birth:  1951/09/27

## 2020-02-03 ENCOUNTER — Encounter: Payer: Self-pay | Admitting: Physical Therapy

## 2020-02-03 ENCOUNTER — Ambulatory Visit: Payer: Medicare PPO | Admitting: Physical Therapy

## 2020-02-03 ENCOUNTER — Other Ambulatory Visit: Payer: Self-pay

## 2020-02-03 DIAGNOSIS — R279 Unspecified lack of coordination: Secondary | ICD-10-CM

## 2020-02-03 DIAGNOSIS — R2681 Unsteadiness on feet: Secondary | ICD-10-CM

## 2020-02-03 DIAGNOSIS — M6281 Muscle weakness (generalized): Secondary | ICD-10-CM

## 2020-02-03 DIAGNOSIS — R296 Repeated falls: Secondary | ICD-10-CM

## 2020-02-03 NOTE — Therapy (Signed)
[facility] [place], [place], [address] Phone: [phone]   Fax:  [phone]  Physical Therapy Treatment  Patient Details  Name: Shelly Bennett MRN: [id number] Date of Birth: 06-16-1951 Referring Provider (PT): Caryl Pina, MD   Encounter Date: 02/03/2020  PT End of Session - 02/03/20 1113    Visit Number  9    Number of Visits  18    Date for PT Re-Evaluation  02/26/20    Authorization Type  progress note every 10th visit, KX modifier at 15th visit    PT Start Time  1038    PT Stop Time  1119    PT Time Calculation (min)  41 min    Activity Tolerance  Patient tolerated treatment well    Behavior During Therapy  [facility] for tasks assessed/performed       Past Medical History:  Diagnosis Date  . Adenomatous colon polyp   . Allergic drug reaction 06/25/2015  . Allergy   . Anxiety   . Arthritis   . At risk for falls    fallen 4 times recently   . CAD (coronary artery disease)    Dr. Burt Knack follows   . Carpal tunnel syndrome   . Chronic headaches   . Coccydynia 02/01/2012  . Cystocele   . Diverticulosis   . External hemorrhoids   . Focal nodular hyperplasia of liver   . GERD (gastroesophageal reflux disease)    some  . HCAP (healthcare-associated pneumonia) 06/25/2015  . Heart murmur   . HLD (hyperlipidemia)   . HTN (hypertension)   . IBS (irritable bowel syndrome)   . Memory loss   . Neuromuscular disorder ([place])    some neuropathy issues in the past- legs fibromyalgia in past-- past hx of steroid injections  . Obesity   . Osteoporosis   . Otitis of left ear 02/01/2017  . Pneumonia   . Skin cancer    squamous cell - face    Past Surgical History:  Procedure Laterality Date  . CARPAL TUNNEL RELEASE    . COLONOSCOPY  12/15/2008   diverticulosis, external hemorrhoids  . KNEE SURGERY  05/22/2015   right  . LAPAROSCOPY    . SKIN SURGERY     squamous cell - right face     There were no vitals  filed for this visit.  Subjective Assessment - 02/03/20 1045    Subjective  COVID 19 screening performed on patient upon arrival. Patient arrived feeling some off balance today, did well after last treatment per reported    Pertinent History  HTN, heart murmur, IBS, neuropathy    Limitations  Standing;House hold activities    Diagnostic tests  MRI: normal results per patient    Patient Stated Goals  improve balance    Currently in Pain?  No/denies         [facility] PT Assessment - 02/03/20 0001      Berg Balance Test   Sit to Stand  Able to stand  independently using hands    Standing Unsupported  Able to stand 2 minutes with supervision    Sitting with Back Unsupported but Feet Supported on Floor or Stool  Able to sit safely and securely 2 minutes    Stand to Sit  Controls descent by using hands    Transfers  Able to transfer safely, definite need of hands    Standing Unsupported with Eyes Closed  Able to stand 10 seconds safely    Standing  Unsupported with Feet Together  Able to place feet together independently and stand 1 minute safely    From Standing, Reach Forward with Outstretched Arm  Can reach confidently >25 cm (10")    From Standing Position, Pick up Object from Floor  Able to pick up shoe, needs supervision    From Standing Position, Turn to Look Behind Over each Shoulder  Turn sideways only but maintains balance    Turn 360 Degrees  Able to turn 360 degrees safely but slowly    Standing Unsupported, Alternately Place Feet on Step/Stool  Able to stand independently and complete 8 steps >20 seconds    Standing Unsupported, One Foot in Front  Able to take small step independently and hold 30 seconds    Standing on One Leg  Tries to lift leg/unable to hold 3 seconds but remains standing independently    Total Score  41                   [facility] Adult PT Treatment/Exercise - 02/03/20 0001      Knee/Hip Exercises: Aerobic   Nustep  L4 x16 min, monitored for  progression      Knee/Hip Exercises: Seated   Long Arc Quad  Strengthening;Both;3 sets;10 reps;Weights    Long Arc Quad Weight  4 lbs.    Hamstring Curl  Strengthening;Both;2 sets;10 reps    Hamstring Limitations  red theraband      Knee/Hip Exercises: Supine   Bridges  Strengthening;Both;20 reps    Bridges Limitations  feet on dynadisc for core    Bridges with Clamshell  Strengthening;Both;20 reps   red t-band   Straight Leg Raises  Strengthening;Both;20 reps    Other Supine Knee/Hip Exercises  clam with red t-band x20          Balance Exercises - 02/03/20 1055      Balance Exercises: Standing   Standing Eyes Opened  Narrow base of support (BOS);Wide (BOA);Foam/compliant surface;Solid surface   x several reps each one to challange balance   Standing Eyes Closed  Wide (BOA);Solid surface;2 reps;Time;10 secs    Tandem Stance  Eyes open;Intermittent upper extremity support;4 reps    SLS  Eyes open;Limitations;Solid surface;2 reps    Heel Raises  20 reps    Toe Raise  20 reps    Sit to Stand  Standard surface   2x10         PT Short Term Goals - 11/24/19 1312      PT SHORT TERM GOAL #1   Title  STG=LTG        PT Long Term Goals - 02/03/20 1049      PT LONG TERM GOAL #1   Title  Patient will be independent with HEP    Time  6    Period  Weeks    Status  On-going      PT LONG TERM GOAL #2   Title  Patient will perform modified 5x sit to stand in 22 seconds or less to reduce risk of falls.    Time  6    Period  Weeks    Status  On-going      PT LONG TERM GOAL #3   Title  Patient will improve LE MMT to 4+/5 or greater to improve stability during functional tasks.    Time  6    Period  Weeks    Status  On-going      PT LONG TERM GOAL #4   Title  Patient will improve Berg Balance scale by 4 points or greater to decrease risk of falls.    Time  6    Period  Weeks    Status  On-going   41/56 improved by 3 points 02/03/20           Plan - 02/03/20  1119    Clinical Impression Statement  Patient tolerated treatment well today. Patient felt some off balance upon arrival yet was able to complete all exercises and balance activities with no LOB. Patient did not use an assistive device. Educated patient on using a cane and she will bring next visit to practive correct use and technique for safety. Patient continues to have difficulty with sit to stands today. Patient improved BERG score by 3 points today 41/56. Goals progressing at this time.    Personal Factors and Comorbidities  Comorbidity 1;Time since onset of injury/illness/exacerbation    Comorbidities  HTN, heart murmur; bilateral knee scopes, chronic back pain with sciatica and LE weakness    Stability/Clinical Decision Making  Stable/Uncomplicated    Rehab Potential  Fair    PT Frequency  2x / week    PT Duration  6 weeks    PT Treatment/Interventions  ADLs/Self Care Home Management;Biofeedback;Cryotherapy;Software engineer;Therapeutic activities;Therapeutic exercise;Balance training;Neuromuscular re-education;Patient/family education;Manual techniques;Passive range of motion;Dry needling;Taping    PT Next Visit Plan  continue with POC for strength and  balance activities, LE strengthening in various positions.    Consulted and Agree with Plan of Care  Patient       Patient will benefit from skilled therapeutic intervention in order to improve the following deficits and impairments:  Abnormal gait, Pain, Postural dysfunction, Decreased strength, Decreased coordination, Impaired tone, Decreased range of motion, Decreased balance  Visit Diagnosis: Unsteadiness on feet  Repeated falls  Muscle weakness (generalized)  Unspecified lack of coordination     Problem List Patient Active Problem List   Diagnosis Date Noted  . Gastroesophageal reflux disease 10/19/2019  . Hypertensive disorder 10/19/2019  . Third degree uterine prolapse 10/19/2019  .  Spinal stenosis of lumbar region without neurogenic claudication 09/15/2019  . Urinary and fecal incontinence 09/15/2019  . Cystocele with rectocele 09/15/2019  . Radial styloid tenosynovitis (de quervain) 06/24/2019  . Trigger finger, right middle finger 06/24/2019  . Pain in left wrist 05/27/2019  . Trigger thumb, right thumb 10/29/2018  . Memory loss 07/22/2018  . Low back pain 07/22/2018  . Left lumbar radiculopathy 01/22/2018  . Mild cognitive impairment 10/22/2017  . Anxiety 10/22/2017  . Aortic atherosclerosis ([place]) 12/04/2016  . Ptosis of right eyelid 02/01/2016  . Dermatochalasis of both upper eyelids 03/07/2015  . Vitamin D deficiency 08/19/2014  . 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 Family history of colon cancer-father at 25+ grandparent w/ rectal cancer 01/14/2014  . MGD (meibomian gland dysfunction) 10/14/2013  . Injection of surface of eye 10/14/2013  . Osteopenia 08/12/2013  . CAD (coronary artery disease), native coronary artery 03/22/2013  . Aortic valve stenosis, mild 03/22/2013  . Hyperlipemia 03/11/2013  . Hypertension 03/11/2013  . Allergic rhinitis 03/11/2013  . Left shoulder pain 03/11/2013  . IBS (irritable bowel syndrome) - with chronic recurrent abdominal pain 02/01/2012  . Personal history of colonic polyps - adenoma 02/01/2012    Phillips Climes, PTA 02/03/2020, 11:24 AM  [facility] [place], [place], [address] Phone: [phone]   Fax:  [phone]  Name: Shelly Bennett MRN:  [id number] Date of Birth: 1951-08-13

## 2020-02-05 ENCOUNTER — Ambulatory Visit: Payer: Medicare PPO | Admitting: Physical Therapy

## 2020-02-05 ENCOUNTER — Other Ambulatory Visit: Payer: Self-pay

## 2020-02-05 ENCOUNTER — Encounter: Payer: Self-pay | Admitting: Physical Therapy

## 2020-02-05 DIAGNOSIS — M6281 Muscle weakness (generalized): Secondary | ICD-10-CM

## 2020-02-05 DIAGNOSIS — R2681 Unsteadiness on feet: Secondary | ICD-10-CM

## 2020-02-05 DIAGNOSIS — R296 Repeated falls: Secondary | ICD-10-CM

## 2020-02-05 DIAGNOSIS — R279 Unspecified lack of coordination: Secondary | ICD-10-CM

## 2020-02-05 NOTE — Therapy (Signed)
[facility] [place], [place], [address] Phone: [phone]   Fax:  [phone]  Physical Therapy Treatment  Progress Note Reporting Period 11/23/2020 to 02/05/2020  See note below for Objective Data and Assessment of Progress/Goals.  Goals are ongoing at this time. Short hiatus in treatment secondary to COVID-19. Gabriela Eves, PT, DPT 02/05/20    Patient Details  Name: Shelly Bennett MRN: [id number] Date of Birth: [birth year] Referring Provider (PT): Caryl Pina, MD   Encounter Date: 02/05/2020  PT End of Session - 02/05/20 1145    Visit Number  10    Number of Visits  18    Date for PT Re-Evaluation  02/26/20    Authorization Type  progress note every 10th visit, KX modifier at 15th visit    PT Start Time  1037    PT Stop Time  1117   late arrival   PT Time Calculation (min)  40 min    Equipment Utilized During Treatment  Other (comment)   SPC   Activity Tolerance  Patient tolerated treatment well    Behavior During Therapy  [facility] for tasks assessed/performed       Past Medical History:  Diagnosis Date  . Adenomatous colon polyp   . Allergic drug reaction 06/25/2015  . Allergy   . Anxiety   . Arthritis   . At risk for falls    fallen 4 times recently   . CAD (coronary artery disease)    Dr. Burt Knack follows   . Carpal tunnel syndrome   . Chronic headaches   . Coccydynia 02/01/2012  . Cystocele   . Diverticulosis   . External hemorrhoids   . Focal nodular hyperplasia of liver   . GERD (gastroesophageal reflux disease)    some  . HCAP (healthcare-associated pneumonia) 06/25/2015  . Heart murmur   . HLD (hyperlipidemia)   . HTN (hypertension)   . IBS (irritable bowel syndrome)   . Memory loss   . Neuromuscular disorder ([place])    some neuropathy issues in the past- legs fibromyalgia in past-- past hx of steroid injections  . Obesity   . Osteoporosis   . Otitis of left ear 02/01/2017  .  Pneumonia   . Skin cancer    squamous cell - face    Past Surgical History:  Procedure Laterality Date  . CARPAL TUNNEL RELEASE    . COLONOSCOPY  12/15/2008   diverticulosis, external hemorrhoids  . KNEE SURGERY  05/22/2015   right  . LAPAROSCOPY    . SKIN SURGERY     squamous cell - right face     There were no vitals filed for this visit.  Subjective Assessment - 02/05/20 1036    Subjective  COVID 19 screening performed on patient upon arrival. Reports that she went to the grocery store for the first time since pandemic began and felt safe as long as she had a cart.    Pertinent History  HTN, heart murmur, IBS, neuropathy    Limitations  Standing;House hold activities    Diagnostic tests  MRI: normal results per patient    Patient Stated Goals  improve balance    Currently in Pain?  No/denies         [facility] PT Assessment - 02/05/20 0001      Assessment   Medical Diagnosis  Balance disorder, fall    Referring Provider (PT)  Caryl Pina, MD    Prior Therapy  yes  Precautions   Precautions  Fall      Restrictions   Weight Bearing Restrictions  No                   [facility] Adult PT Treatment/Exercise - 02/05/20 0001      Knee/Hip Exercises: Aerobic   Nustep  L4 x19 min, monitored for progression      Knee/Hip Exercises: Standing   Hip Flexion  Stengthening;Both;20 reps;Knee bent;Limitations    Hip Flexion Limitations  2#    Hip Abduction  Stengthening;Both;20 reps;Knee straight;Limitations    Abduction Limitations  2#    Hip Extension  Stengthening;Both;20 reps;Knee straight;Limitations    Extension Limitations  2#    Forward Step Up  Both;20 reps;Hand Hold: 2;Step Height: 6"      Knee/Hip Exercises: Seated   Long Arc Quad  Strengthening;Both;3 sets;10 reps;Weights    Long Arc Quad Weight  4 lbs.    Clamshell with TheraBand  Red   x20 reps   Sit to Sand  20 reps;without UE support               PT Short Term Goals - 11/24/19  1312      PT SHORT TERM GOAL #1   Title  STG=LTG        PT Long Term Goals - 02/03/20 1049      PT LONG TERM GOAL #1   Title  Patient will be independent with HEP    Time  6    Period  Weeks    Status  On-going      PT LONG TERM GOAL #2   Title  Patient will perform modified 5x sit to stand in 22 seconds or less to reduce risk of falls.    Time  6    Period  Weeks    Status  On-going      PT LONG TERM GOAL #3   Title  Patient will improve LE MMT to 4+/5 or greater to improve stability during functional tasks.    Time  6    Period  Weeks    Status  On-going      PT LONG TERM GOAL #4   Title  Patient will improve Berg Balance scale by 4 points or greater to decrease risk of falls.    Time  6    Period  Weeks    Status  On-going   41/56 improved by 3 points 02/03/20           Plan - 02/05/20 1148    Clinical Impression Statement  Patient presented in clinic with reports of continued LE weakness. Patient did report doing fairly well at recent trip to grocery store as long as she had a cert to hold to. Patient progressed through more standing and functional strengthening exercises. VCs to avoid excessive hip ER with hip abduction. Weakness of B hip abductors noted. Patient did report knee weakness during therex session.    Personal Factors and Comorbidities  Comorbidity 1;Time since onset of injury/illness/exacerbation    Comorbidities  HTN, heart murmur; bilateral knee scopes, chronic back pain with sciatica and LE weakness    Stability/Clinical Decision Making  Stable/Uncomplicated    Rehab Potential  Fair    PT Frequency  2x / week    PT Duration  6 weeks    PT Treatment/Interventions  ADLs/Self Care Home Management;Biofeedback;Cryotherapy;Software engineer;Therapeutic activities;Therapeutic exercise;Balance training;Neuromuscular re-education;Patient/family education;Manual techniques;Passive range of motion;Dry needling;Taping    PT Next  Visit Plan  continue with POC for strength and  balance activities, LE strengthening in various positions.    PT Home Exercise Plan  see patient education    Consulted and Agree with Plan of Care  Patient       Patient will benefit from skilled therapeutic intervention in order to improve the following deficits and impairments:  Abnormal gait, Pain, Postural dysfunction, Decreased strength, Decreased coordination, Impaired tone, Decreased range of motion, Decreased balance  Visit Diagnosis: Unsteadiness on feet  Repeated falls  Muscle weakness (generalized)  Unspecified lack of coordination     Problem List Patient Active Problem List   Diagnosis Date Noted  . Gastroesophageal reflux disease 10/19/2019  . Hypertensive disorder 10/19/2019  . Third degree uterine prolapse 10/19/2019  . Spinal stenosis of lumbar region without neurogenic claudication 09/15/2019  . Urinary and fecal incontinence 09/15/2019  . Cystocele with rectocele 09/15/2019  . Radial styloid tenosynovitis (de quervain) 06/24/2019  . Trigger finger, right middle finger 06/24/2019  . Pain in left wrist 05/27/2019  . Trigger thumb, right thumb 10/29/2018  . Memory loss 07/22/2018  . Low back pain 07/22/2018  . Left lumbar radiculopathy 01/22/2018  . Mild cognitive impairment 10/22/2017  . Anxiety 10/22/2017  . Aortic atherosclerosis ([place]) 12/04/2016  . Ptosis of right eyelid 02/01/2016  . Dermatochalasis of both upper eyelids 03/07/2015  . Vitamin D deficiency 08/19/2014  . 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 Family history of colon cancer-father at 59+ grandparent w/ rectal cancer 01/14/2014  . MGD (meibomian gland dysfunction) 10/14/2013  . Injection of surface of eye 10/14/2013  . Osteopenia 08/12/2013  . CAD (coronary artery disease), native coronary artery 03/22/2013  . Aortic valve stenosis, mild 03/22/2013  . Hyperlipemia 03/11/2013  . Hypertension 03/11/2013   . Allergic rhinitis 03/11/2013  . Left shoulder pain 03/11/2013  . IBS (irritable bowel syndrome) - with chronic recurrent abdominal pain 02/01/2012  . Personal history of colonic polyps - adenoma 02/01/2012    Standley Brooking, PTA 02/05/2020, 12:01 PM  [facility] [address] [place], [place], [address] Phone: [phone]   Fax:  [phone]  Name: Shelly Bennett MRN: [id number] Date of Birth: Aug 03, 1951

## 2020-02-10 ENCOUNTER — Emergency Department (HOSPITAL_COMMUNITY)
Admission: EM | Admit: 2020-02-10 | Discharge: 2020-02-10 | Disposition: A | Discharge status: Home / Self Care | Payer: Medicare PPO | Attending: Emergency Medicine | Admitting: Emergency Medicine

## 2020-02-10 ENCOUNTER — Emergency Department (HOSPITAL_COMMUNITY): Payer: Medicare PPO

## 2020-02-10 ENCOUNTER — Encounter (HOSPITAL_COMMUNITY): Payer: Self-pay | Admitting: Emergency Medicine

## 2020-02-10 ENCOUNTER — Other Ambulatory Visit: Payer: Self-pay

## 2020-02-10 ENCOUNTER — Ambulatory Visit: Payer: Medicare PPO | Admitting: Physical Therapy

## 2020-02-10 DIAGNOSIS — Y999 Unspecified external cause status: Secondary | ICD-10-CM | POA: Diagnosis not present

## 2020-02-10 DIAGNOSIS — S0990XA Unspecified injury of head, initial encounter: Secondary | ICD-10-CM | POA: Insufficient documentation

## 2020-02-10 DIAGNOSIS — R296 Repeated falls: Secondary | ICD-10-CM

## 2020-02-10 DIAGNOSIS — R279 Unspecified lack of coordination: Secondary | ICD-10-CM

## 2020-02-10 DIAGNOSIS — I2510 Atherosclerotic heart disease of native coronary artery without angina pectoris: Secondary | ICD-10-CM | POA: Diagnosis not present

## 2020-02-10 DIAGNOSIS — Y9289 Other specified places as the place of occurrence of the external cause: Secondary | ICD-10-CM | POA: Diagnosis not present

## 2020-02-10 DIAGNOSIS — W1789XA Other fall from one level to another, initial encounter: Secondary | ICD-10-CM | POA: Insufficient documentation

## 2020-02-10 DIAGNOSIS — S4991XA Unspecified injury of right shoulder and upper arm, initial encounter: Secondary | ICD-10-CM | POA: Diagnosis present

## 2020-02-10 DIAGNOSIS — W19XXXA Unspecified fall, initial encounter: Secondary | ICD-10-CM

## 2020-02-10 DIAGNOSIS — S46911A Strain of unspecified muscle, fascia and tendon at shoulder and upper arm level, right arm, initial encounter: Secondary | ICD-10-CM | POA: Insufficient documentation

## 2020-02-10 DIAGNOSIS — R2681 Unsteadiness on feet: Secondary | ICD-10-CM

## 2020-02-10 DIAGNOSIS — Y939 Activity, unspecified: Secondary | ICD-10-CM | POA: Insufficient documentation

## 2020-02-10 DIAGNOSIS — M6281 Muscle weakness (generalized): Secondary | ICD-10-CM

## 2020-02-10 DIAGNOSIS — S161XXA Strain of muscle, fascia and tendon at neck level, initial encounter: Secondary | ICD-10-CM | POA: Diagnosis not present

## 2020-02-10 DIAGNOSIS — Z79899 Other long term (current) drug therapy: Secondary | ICD-10-CM | POA: Diagnosis not present

## 2020-02-10 DIAGNOSIS — Z7982 Long term (current) use of aspirin: Secondary | ICD-10-CM | POA: Insufficient documentation

## 2020-02-10 MED ORDER — HYDROCODONE-ACETAMINOPHEN 5-325 MG PO TABS
1.0000 | ORAL_TABLET | Freq: Once | ORAL | Status: AC
  Administered 2020-02-10: 1 via ORAL
  Filled 2020-02-10: qty 1

## 2020-02-10 MED ORDER — HYDROCODONE-ACETAMINOPHEN 5-325 MG PO TABS: ORAL_TABLET | ORAL | 0 refills | Status: DC

## 2020-02-10 MED ORDER — METHOCARBAMOL 500 MG PO TABS: 500.0000 mg | ORAL_TABLET | Freq: Two times a day (BID) | ORAL | 0 refills | Status: DC

## 2020-02-10 NOTE — ED Provider Notes (Signed)
[facility] Provider Note   CSN: [id number] Arrival date & time: 02/10/20  1521     History Chief Complaint  Patient presents with  . Fall    Shelly Bennett is a 69 y.o. female.  HPI      Shelly Bennett is a 69 y.o. female who presents to the Emergency Department complaining of right rib, shoulder, neck and hip pain secondary to a mechanical fall 2-5 feet off the edge of her porch.  Injury occurred just before ER arrival.  States that she was too close to the edge of the porch and fell into the bushes.  Denies head injury or LOC.  No nausea, vomiting or shortness of breath. Denies symptoms preceding the fall.   Pt take an 81 mg ASA daily.     Past Medical History:  Diagnosis Date  . Adenomatous colon polyp   . Allergic drug reaction 06/25/2015  . Allergy   . Anxiety   . Arthritis   . At risk for falls    fallen 4 times recently   . CAD (coronary artery disease)    Dr. Burt Knack follows   . Carpal tunnel syndrome   . Chronic headaches   . Coccydynia 02/01/2012  . Cystocele   . Diverticulosis   . External hemorrhoids   . Focal nodular hyperplasia of liver   . GERD (gastroesophageal reflux disease)    some  . HCAP (healthcare-associated pneumonia) 06/25/2015  . Heart murmur   . HLD (hyperlipidemia)   . HTN (hypertension)   . IBS (irritable bowel syndrome)   . Memory loss   . Neuromuscular disorder ([place])    some neuropathy issues in the past- legs fibromyalgia in past-- past hx of steroid injections  . Obesity   . Osteoporosis   . Otitis of left ear 02/01/2017  . Pneumonia   . Skin cancer    squamous cell - face    Patient Active Problem List   Diagnosis Date Noted  . Gastroesophageal reflux disease 10/19/2019  . Hypertensive disorder 10/19/2019  . Third degree uterine prolapse 10/19/2019  . Spinal stenosis of lumbar region without neurogenic claudication 09/15/2019  . Urinary and fecal incontinence 09/15/2019  . Cystocele with  rectocele 09/15/2019  . Radial styloid tenosynovitis (de quervain) 06/24/2019  . Trigger finger, right middle finger 06/24/2019  . Pain in left wrist 05/27/2019  . Trigger thumb, right thumb 10/29/2018  . Memory loss 07/22/2018  . Low back pain 07/22/2018  . Left lumbar radiculopathy 01/22/2018  . Mild cognitive impairment 10/22/2017  . Anxiety 10/22/2017  . Aortic atherosclerosis ([place]) 12/04/2016  . Ptosis of right eyelid 02/01/2016  . Dermatochalasis of both upper eyelids 03/07/2015  . Vitamin D deficiency 08/19/2014  . 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 Family history of colon cancer-father at 39+ grandparent w/ rectal cancer 01/14/2014  . MGD (meibomian gland dysfunction) 10/14/2013  . Injection of surface of eye 10/14/2013  . Osteopenia 08/12/2013  . CAD (coronary artery disease), native coronary artery 03/22/2013  . Aortic valve stenosis, mild 03/22/2013  . Hyperlipemia 03/11/2013  . Hypertension 03/11/2013  . Allergic rhinitis 03/11/2013  . Left shoulder pain 03/11/2013  . IBS (irritable bowel syndrome) - with chronic recurrent abdominal pain 02/01/2012  . Personal history of colonic polyps - adenoma 02/01/2012    Past Surgical History:  Procedure Laterality Date  . CARPAL TUNNEL RELEASE    . COLONOSCOPY  12/15/2008   diverticulosis, external hemorrhoids  .  KNEE SURGERY  05/22/2015   right  . LAPAROSCOPY    . SKIN SURGERY     squamous cell - right face      OB History    Gravida      Para      Term      Preterm      AB      Living  4     SAB      TAB      Ectopic      Multiple      Live Births              Family History  Problem Relation Age of Onset  . Prostate cancer Father   . Colon cancer Father 52  . Kidney disease Father   . Heart disease Father   . Alzheimer's disease Father   . Hyperlipidemia Mother   . Hypertension Mother   . Kidney disease Mother   . Osteoporosis Mother   . Heart  murmur Mother   . Dementia Mother   . Hyperlipidemia Sister   . Hypertension Sister   . Heart disease Brother 43       not dx questionable   . Heart disease Maternal Grandfather   . Rectal cancer Maternal Grandmother 46  . Colon polyps Neg Hx   . Esophageal cancer Neg Hx   . Stomach cancer Neg Hx     Social History   Tobacco Use  . Smoking status: Never Smoker  . Smokeless tobacco: Never Used  Substance Use Topics  . Alcohol use: No  . Drug use: No    Home Medications Prior to Admission medications   Medication Sig Start Date End Date Taking? Authorizing Provider  albuterol (VENTOLIN HFA) 108 (90 Base) MCG/ACT inhaler Inhale 2 puffs into the lungs every 6 (six) hours as needed for wheezing or shortness of breath. 01/18/20   Dettinger, Fransisca Kaufmann, MD  amLODipine (NORVASC) 10 MG tablet TAKE (1/2) TABLET DAILY. 10/19/19   Dettinger, Fransisca Kaufmann, MD  aspirin 81 MG tablet Take 81 mg by mouth daily.     [provider]  buPROPion (WELLBUTRIN XL) 150 MG 24 hr tablet Take 1 tablet (150 mg total) by mouth daily. 01/18/20   Dettinger, Fransisca Kaufmann, MD  Calcium Carbonate (CALCARB 600 PO) Take by mouth.    [provider]  Cholecalciferol (VITAMIN D-3) 5000 UNITS TABS Take 1 capsule by mouth as directed. Take one capsule by mouth daily Mon thru Friday    [provider]  dexamethasone (DECADRON) 2 MG tablet Take 4 tablets for 3 days then 3 tablets for 3 days then 2 tablets for 3 days then 1 tablet for 3 days 01/18/20   Dettinger, Fransisca Kaufmann, MD  fexofenadine (ALLEGRA) 180 MG tablet Take 180 mg by mouth daily.    [provider]  fluticasone (FLONASE) 50 MCG/ACT nasal spray Place 2 sprays into both nostrils daily. 01/18/20   Dettinger, Fransisca Kaufmann, MD  hydrochlorothiazide (HYDRODIURIL) 25 MG tablet TAKE (1/2) TABLET DAILY. 10/19/19   Dettinger, Fransisca Kaufmann, MD  HYDROcodone-acetaminophen (NORCO/VICODIN) 5-325 MG tablet Take one tab po q 4 hrs prn pain 02/10/20   Farzana Koci, PA-C    methocarbamol (ROBAXIN) 500 MG tablet Take 1 tablet (500 mg total) by mouth 2 (two) times daily. 02/10/20   Mysha Peeler, PA-C  montelukast (SINGULAIR) 10 MG tablet Take 1 tablet (10 mg total) by mouth at bedtime. 10/19/19   Dettinger, Fransisca Kaufmann, MD  omega-3 acid ethyl esters (LOVAZA) 1 g capsule TAKE (1) CAPSULE FOUR TIMES DAILY. 11/05/19   Dettinger, Fransisca Kaufmann, MD  ondansetron (ZOFRAN) 4 MG tablet Take 1 tablet (4 mg total) by mouth every 8 (eight) hours as needed for nausea or vomiting. 12/16/19 12/15/20  Eileen Stanford, PA-C  rosuvastatin (CRESTOR) 20 MG tablet Take 1 tablet (20 mg total) by mouth daily. 01/18/20   Dettinger, Fransisca Kaufmann, MD    Allergies    Codeine, Mold extract [trichophyton], Penicillins, Ace inhibitors, Angiotensin receptor blockers, Levaquin [levofloxacin], Vytorin [ezetimibe-simvastatin], and Zetia [ezetimibe]  Review of Systems   Review of Systems  Constitutional: Negative for chills and fever.  Eyes: Negative for photophobia and visual disturbance.  Respiratory: Negative for shortness of breath and wheezing.   Cardiovascular: Positive for chest pain (right rib pain).  Gastrointestinal: Negative for abdominal pain, nausea and vomiting.  Genitourinary: Negative for difficulty urinating and dysuria.  Musculoskeletal: Positive for arthralgias (right shoulder pain) and neck pain. Negative for joint swelling.  Skin: Negative for color change and wound.  Neurological: Negative for dizziness, syncope, weakness and headaches.  Psychiatric/Behavioral: Negative for confusion.    Physical Exam Updated Vital Signs BP (!) 145/82 (BP Location: Right Arm)   Pulse 76   Temp 98.9 F (37.2 C) (Oral)   Resp 16   SpO2 99%   Physical Exam Vitals and nursing note reviewed.  Constitutional:      Appearance: Normal appearance. She is not ill-appearing.  HENT:     Head:     Comments: Small abrasion to right parietal scalp.  No bleeding or hematoma.      Right Ear: Tympanic  membrane and ear canal normal.     Left Ear: Tympanic membrane and ear canal normal.     Mouth/Throat:     Mouth: Mucous membranes are moist.  Eyes:     Extraocular Movements: Extraocular movements intact.     Conjunctiva/sclera: Conjunctivae normal.     Pupils: Pupils are equal, round, and reactive to light.  Neck:     Comments: c collar applied at triage.  ttp of lower cervical spine and bilateral cervical paraspinal muscles.  No bony step offs.  Cardiovascular:     Rate and Rhythm: Normal rate and regular rhythm.     Pulses: Normal pulses.  Chest:     Chest wall: Tenderness (mild ttp of the right lateral chest wall.  no abrasion, crepitus or ecchymosis.  ) present.  Abdominal:     Palpations: Abdomen is soft. There is no mass.     Tenderness: There is no abdominal tenderness. There is no guarding.  Musculoskeletal:     Right shoulder: Tenderness present. No swelling, deformity, bony tenderness or crepitus. Normal strength. Normal pulse.     Cervical back: Tenderness present.     Comments: ttp of the anterior right shoulder.  No step off  Neurological:     General: No focal deficit present.     Mental Status: She is alert.     GCS: GCS eye subscore is 4. GCS verbal subscore is 5. GCS motor subscore is 6.     Sensory: Sensation is intact. No sensory deficit.     Motor: Motor function is intact. No weakness or pronator drift.     Coordination: Coordination is intact.     Gait: Gait is intact.     Comments: CN II-XII intact.  Speech clear.  grips equal. No pronator drift or extremity weakness     ED  Results / Procedures / Treatments   Labs (all labs ordered are listed, but only abnormal results are displayed) Labs Reviewed - No data to display  EKG None  Radiology DG Ribs Unilateral W/Chest Right  Result Date: 02/10/2020 CLINICAL DATA:  Pain status post fall. Right shoulder pain and right rib pain. EXAM: RIGHT RIBS AND CHEST - 3+ VIEW COMPARISON:  02/07/2020 FINDINGS: No  fracture or other bone lesions are seen involving the ribs. There is no evidence of pneumothorax or pleural effusion. Both lungs are clear. Heart size and mediastinal contours are within normal limits. Aortic calcifications are noted. IMPRESSION: Negative. Electronically Signed   By: Constance Holster M.D.   On: 02/10/2020 17:34   DG Cervical Spine Complete  Result Date: 02/10/2020 CLINICAL DATA:  Pain status post fall. EXAM: CERVICAL SPINE - COMPLETE 4+ VIEW COMPARISON:  March 22, 2016. FINDINGS: There are degenerative changes throughout the visualized cervical spine, greatest at the C3-C4 and C4-C5 levels. These degenerative changes have progressed since 2017. There is no prevertebral soft tissue swelling. No displaced fracture. No malalignment. IMPRESSION: 1. No acute abnormality. 2. Multilevel degenerative changes of the cervical spine as detailed above. Electronically Signed   By: Constance Holster M.D.   On: 02/10/2020 17:01   DG Lumbar Spine Complete  Result Date: 02/10/2020 CLINICAL DATA:  [place] 2 feet, right rib pain EXAM: LUMBAR SPINE - COMPLETE 4+ VIEW COMPARISON:  11/21/2018 FINDINGS: Frontal, bilateral oblique, and lateral views of the lumbar spine are obtained. There are 5 non-rib-bearing lumbar type vertebral bodies in anatomic alignment. Chronic invagination of the superior endplate of L2, stable. No acute displaced fractures. Mild anterior osteophyte formation from L1 through L4. Mild facet hypertrophy at the lumbosacral junction. Sacroiliac joints are normal. IMPRESSION: 1. Stable spondylosis and facet hypertrophy. 2. No acute bony abnormality. Electronically Signed   By: Randa Ngo M.D.   On: 02/10/2020 18:54   DG Shoulder Right  Result Date: 02/10/2020 CLINICAL DATA:  Patient fell off a porch.  Right shoulder pain. EXAM: RIGHT SHOULDER - 2+ VIEW COMPARISON:  None. FINDINGS: Two view exam of the right shoulder shows no evidence for fracture or dislocation. Acromioclavicular joint  appears preserved. Hypertrophic spurring is noted in the humeral head. IMPRESSION: Negative. Electronically Signed   By: Misty Stanley M.D.   On: 02/10/2020 17:00   CT Head Wo Contrast  Result Date: 02/10/2020 CLINICAL DATA:  Head trauma, minor. Additional history provided: Patient fell approximately 2 feet from a porch today, patient reports right shoulder and rib pain. EXAM: CT HEAD WITHOUT CONTRAST TECHNIQUE: Contiguous axial images were obtained from the base of the skull through the vertex without intravenous contrast. COMPARISON:  Brain MRI 12/03/2019 FINDINGS: Brain: There is no evidence of acute intracranial hemorrhage, intracranial mass, midline shift or extra-axial fluid collection. Mild ill-defined hypoattenuation within the cerebral white matter is nonspecific, but consistent with chronic small vessel ischemic disease. Stable generalized cerebral atrophy. Mild lateral and third ventriculomegaly is unchanged. Vascular: No hyperdense vessel. Atherosclerotic calcifications. Skull: Normal. Negative for fracture or focal lesion. Sinuses/Orbits: Visualized orbits demonstrate no acute abnormality. At the imaged levels, there is trace mucosal thickening within right frontal, bilateral ethmoid and right maxillary sinuses. No significant mastoid effusion. IMPRESSION: No evidence of acute intracranial abnormality. Unchanged from prior MRI 12/03/2019, there is mild lateral and third ventriculomegaly which may be related to central predominant cerebral atrophy, although a component of chronic hydrocephalus is possible. Mild chronic small vessel ischemic disease. Electronically Signed   By:  Kellie Simmering DO   On: 02/10/2020 18:31   DG Hip Unilat W or Wo Pelvis 2-3 Views Right  Result Date: 02/10/2020 CLINICAL DATA:  [place] 2 feet, pain EXAM: DG HIP (WITH OR WITHOUT PELVIS) 2-3V RIGHT COMPARISON:  12/05/2016 FINDINGS: Frontal view the pelvis as well as frontal and frogleg lateral views of the right hip are  obtained. No acute displaced fracture. Symmetrical bilateral hip osteoarthritis. 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 Remainder of the bony pelvis is unremarkable. Sacroiliac joints are normal. IMPRESSION: 1. No acute displaced fracture. 2. Symmetrical hip osteoarthritis, stable. 3. 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 Electronically Signed   By: Randa Ngo M.D.   On: 02/10/2020 18:53    Procedures Procedures (including critical care time)  Medications Ordered in ED Medications  HYDROcodone-acetaminophen (NORCO/VICODIN) 5-325 MG per tablet 1 tablet (1 tablet Oral Given 02/10/20 1820)    ED Course  I have reviewed the triage vital signs and the nursing notes.  Pertinent labs & imaging results that were available during my care of the patient were reviewed by me and considered in my medical decision making (see chart for details).    MDM Rules/Calculators/A&P                       Pt with injuries resulting from a mechanical fall.  NV intact.  No LOC.  No reported head injury, but abrasion to scalp.  Imaging reassuring.  C collar removed by me after review of films.  Pt has ambulated in the dept and gait is steady.  Likely musculoskeletal injuries.  Strict return precautions discussed.  Final Clinical Impression(s) / ED Diagnoses Final diagnoses:  Fall in home, initial encounter  Minor head injury, initial encounter  Acute strain of neck muscle, initial encounter  Strain of right shoulder, initial encounter     Rx / DC Orders ED Discharge Orders         Ordered    HYDROcodone-acetaminophen (NORCO/VICODIN) 5-325 MG tablet     02/10/20 1945    methocarbamol (ROBAXIN) 500 MG tablet  2 times daily     02/10/20 1945           Kem Parkinson, Hershal Coria 02/12/20 2151    Fredia Sorrow, MD 03/01/20 1842

## 2020-02-10 NOTE — ED Notes (Signed)
Patient ambulated to the end of the hall and back to room with a steady even gait.

## 2020-02-10 NOTE — Discharge Instructions (Signed)
Apply ice packs on and off to your shoulder and neck.  You may take pain medication as directed if needed for pain.  This may cause drowsiness.  Follow-up with your primary doctor for recheck, or contact the orthopedic doctor listed to arrange follow-up appointment in 1 week if not improving.

## 2020-02-10 NOTE — Therapy (Addendum)
[facility] [place], [place], [address] Phone: [phone]   Fax:  [phone]  Physical Therapy Treatment   PHYSICAL THERAPY DISCHARGE SUMMARY  Visits from Start of Care: 11  Current functional level related to goals / functional outcomes: See below   Remaining deficits: See goals   Education / Equipment: HEP Plan: Patient agrees to discharge.  Patient goals were not met. Patient is being discharged due to not returning since the last visit.  ?????   Gabriela Eves, PT, DPT  Patient Details  Name: Shelly Bennett MRN: [id number] Date of Birth: [birth year] Referring Provider (PT): Caryl Pina, MD   Encounter Date: 02/10/2020  PT End of Session - 02/10/20 1151    Visit Number  11    Number of Visits  18    Date for PT Re-Evaluation  02/26/20    Authorization Type  progress note every 10th visit, KX modifier at 15th visit    PT Start Time  1032    PT Stop Time  1120    PT Time Calculation (min)  48 min    Equipment Utilized During Treatment  Other (comment)    Activity Tolerance  Patient tolerated treatment well    Behavior During Therapy  [facility] for tasks assessed/performed       Past Medical History:  Diagnosis Date  . Adenomatous colon polyp   . Allergic drug reaction 06/25/2015  . Allergy   . Anxiety   . Arthritis   . At risk for falls    fallen 4 times recently   . CAD (coronary artery disease)    Dr. Burt Knack follows   . Carpal tunnel syndrome   . Chronic headaches   . Coccydynia 02/01/2012  . Cystocele   . Diverticulosis   . External hemorrhoids   . Focal nodular hyperplasia of liver   . GERD (gastroesophageal reflux disease)    some  . HCAP (healthcare-associated pneumonia) 06/25/2015  . Heart murmur   . HLD (hyperlipidemia)   . HTN (hypertension)   . IBS (irritable bowel syndrome)   . Memory loss   . Neuromuscular disorder ([place])    some neuropathy issues in the past- legs  fibromyalgia in past-- past hx of steroid injections  . Obesity   . Osteoporosis   . Otitis of left ear 02/01/2017  . Pneumonia   . Skin cancer    squamous cell - face    Past Surgical History:  Procedure Laterality Date  . CARPAL TUNNEL RELEASE    . COLONOSCOPY  12/15/2008   diverticulosis, external hemorrhoids  . KNEE SURGERY  05/22/2015   right  . LAPAROSCOPY    . SKIN SURGERY     squamous cell - right face     There were no vitals filed for this visit.  Subjective Assessment - 02/10/20 1047    Subjective  COVID-19 screen performed prior to patient entering clinic.  My kids have noticed I'm doing better.  Left hamstring has been bothering me.    Pertinent History  HTN, heart murmur, IBS, neuropathy    Limitations  Standing;House hold activities    Diagnostic tests  MRI: normal results per patient    Patient Stated Goals  improve balance    Currently in Pain?  Yes    Pain Score  2     Pain Location  Knee    Pain Orientation  Left    Pain Descriptors / Indicators  Discomfort    Pain Onset  More than a month ago                       [facility] Adult PT Treatment/Exercise - 02/10/20 0001      Exercises   Exercises  Knee/Hip      Lumbar Exercises: Aerobic   Nustep  Level 4 x 20 minutes.      Lumbar Exercises: Machines for Strengthening   Cybex Knee Extension  --      Knee/Hip Exercises: Machines for Strengthening   Cybex Knee Extension  30# x 3 minutes.    Cybex Knee Flexion  10# x 2 minutes.          Balance Exercises - 02/10/20 1113      Balance Exercises: Standing   Other Standing Exercises  In parallel bars with CGA:  Rockerboard x 5 minutes f/b inverted BOSU ball x 5 minutes.    Other Standing Exercises Comments  Resisted forward walking with blue XTS x 3 minutes.          PT Short Term Goals - 11/24/19 1312      PT SHORT TERM GOAL #1   Title  STG=LTG        PT Long Term Goals - 02/03/20 1049      PT LONG TERM GOAL #1    Title  Patient will be independent with HEP    Time  6    Period  Weeks    Status  On-going      PT LONG TERM GOAL #2   Title  Patient will perform modified 5x sit to stand in 22 seconds or less to reduce risk of falls.    Time  6    Period  Weeks    Status  On-going      PT LONG TERM GOAL #3   Title  Patient will improve LE MMT to 4+/5 or greater to improve stability during functional tasks.    Time  6    Period  Weeks    Status  On-going      PT LONG TERM GOAL #4   Title  Patient will improve Berg Balance scale by 4 points or greater to decrease risk of falls.    Time  6    Period  Weeks    Status  On-going   41/56 improved by 3 points 02/03/20           Plan - 02/10/20 1155    Clinical Impression Statement  The patient did great today especially with parallel bar activities.  She required CGA and minimal UE support on parallel bars but did not lose her balance.    Personal Factors and Comorbidities  Comorbidity 1;Time since onset of injury/illness/exacerbation    Comorbidities  HTN, heart murmur; bilateral knee scopes, chronic back pain with sciatica and LE weakness    Stability/Clinical Decision Making  Stable/Uncomplicated    Rehab Potential  Fair    PT Frequency  2x / week    PT Duration  6 weeks    PT Treatment/Interventions  ADLs/Self Care Home Management;Biofeedback;Cryotherapy;Software engineer;Therapeutic activities;Therapeutic exercise;Balance training;Neuromuscular re-education;Patient/family education;Manual techniques;Passive range of motion;Dry needling;Taping    PT Next Visit Plan  continue with POC for strength and  balance activities, LE strengthening in various positions.    PT Home Exercise Plan  see patient education    Consulted and Agree with Plan of Care  Patient       Patient will benefit from  skilled therapeutic intervention in order to improve the following deficits and impairments:  Abnormal gait, Pain, Postural  dysfunction, Decreased strength, Decreased coordination, Impaired tone, Decreased range of motion, Decreased balance  Visit Diagnosis: Unsteadiness on feet  Repeated falls  Muscle weakness (generalized)  Unspecified lack of coordination     Problem List Patient Active Problem List   Diagnosis Date Noted  . Gastroesophageal reflux disease 10/19/2019  . Hypertensive disorder 10/19/2019  . Third degree uterine prolapse 10/19/2019  . Spinal stenosis of lumbar region without neurogenic claudication 09/15/2019  . Urinary and fecal incontinence 09/15/2019  . Cystocele with rectocele 09/15/2019  . Radial styloid tenosynovitis (de quervain) 06/24/2019  . Trigger finger, right middle finger 06/24/2019  . Pain in left wrist 05/27/2019  . Trigger thumb, right thumb 10/29/2018  . Memory loss 07/22/2018  . Low back pain 07/22/2018  . Left lumbar radiculopathy 01/22/2018  . Mild cognitive impairment 10/22/2017  . Anxiety 10/22/2017  . Aortic atherosclerosis ([place]) 12/04/2016  . Ptosis of right eyelid 02/01/2016  . Dermatochalasis of both upper eyelids 03/07/2015  . Vitamin D deficiency 08/19/2014  . 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 Family history of colon cancer-father at 63+ grandparent w/ rectal cancer 01/14/2014  . MGD (meibomian gland dysfunction) 10/14/2013  . Injection of surface of eye 10/14/2013  . Osteopenia 08/12/2013  . CAD (coronary artery disease), native coronary artery 03/22/2013  . Aortic valve stenosis, mild 03/22/2013  . Hyperlipemia 03/11/2013  . Hypertension 03/11/2013  . Allergic rhinitis 03/11/2013  . Left shoulder pain 03/11/2013  . IBS (irritable bowel syndrome) - with chronic recurrent abdominal pain 02/01/2012  . Personal history of colonic polyps - adenoma 02/01/2012    Mateus Rewerts, [place] MPT 02/10/2020, 11:58 AM  [facility] [address] [place], [place], [address] Phone:  [phone]   Fax:  [phone]  Name: Javayah Magaw MRN: [id number] Date of Birth: Oct 11, 1951

## 2020-02-10 NOTE — ED Triage Notes (Signed)
Patient fell approx 2 feet from a porch. C/O R shoulder and R rib pain.

## 2020-02-12 ENCOUNTER — Encounter: Payer: Medicare Other | Admitting: Physical Therapy

## 2020-02-15 ENCOUNTER — Telehealth: Payer: Self-pay | Admitting: Radiology

## 2020-02-15 NOTE — Telephone Encounter (Signed)
Called and scheduled patient

## 2020-02-15 NOTE — Telephone Encounter (Signed)
Patient was seen in ED after fall from her porch. She has pain in her head, neck and right shoulder. She was told to follow up with her orthopedic doctor. Appointment for Wed. March 3 at [facility] was offered to patient, but she is requesting something a little later due to her having to travel from home. I was unsure if there was somewhere else you felt she could be worked in?  She states that she is in a lot of pain and is concerned that something is wrong.  CB  [phone]

## 2020-02-17 ENCOUNTER — Ambulatory Visit: Payer: Medicare PPO | Admitting: Orthopaedic Surgery

## 2020-02-17 ENCOUNTER — Ambulatory Visit (INDEPENDENT_AMBULATORY_CARE_PROVIDER_SITE_OTHER): Payer: Medicare PPO

## 2020-02-17 ENCOUNTER — Other Ambulatory Visit: Payer: Self-pay

## 2020-02-17 ENCOUNTER — Encounter: Payer: Self-pay | Admitting: Orthopaedic Surgery

## 2020-02-17 VITALS — Ht 68.0 in | Wt 195.0 lb

## 2020-02-17 DIAGNOSIS — R0782 Intercostal pain: Secondary | ICD-10-CM | POA: Diagnosis not present

## 2020-02-17 DIAGNOSIS — M25511 Pain in right shoulder: Secondary | ICD-10-CM

## 2020-02-17 DIAGNOSIS — R079 Chest pain, unspecified: Secondary | ICD-10-CM | POA: Insufficient documentation

## 2020-02-17 NOTE — Progress Notes (Signed)
Office Visit Note   Patient: Shelly Bennett           Date of Birth: [birth year]           MRN: [id number] Visit Date: 02/17/2020              Requested by: Shelly Bennett, Shelly Kaufmann, MD [place],  [place] [address] PCP: Shelly Bennett, Shelly Kaufmann, MD   Assessment & Plan: Visit Diagnoses:  1. Acute pain of right shoulder   2. Intercostal pain     Plan: Shelly Bennett fell last week on her right side.  She has had some recent issues with balance.  She was seen in the [facility] emergency room with negative chest and rib films.  Films of the lumbar spine and cervical spine were negative for fracture but with degenerative changes.  CT scan of her head was negative for any acute changes.  She is presently having pain along her right side with some bruising.  I did review her films and did not see any evidence of acute changes.  I suspect that she may have had a rib bruise or intercostal pain or possibly a nonvisualized rib fracture.  She has hydrocodone and Robaxin for pain.  I tried a back support but it would not fit his her abdomen was too large.  I do not think she has any intra-abdominal issues as she does not have any abdominal pain or nausea and no blood in her urine.  Films of her shoulder were negative.  She could have had a rotator cuff tear but is able to place her arm fully overhead.  I like to see her back in next several weeks.  If she has any further trouble she may want to check with her family physician particularly in reference to her abdomen.  If she has any localizing features over the next several weeks and may repeat your rib films or possibly consider an MRI scan of the lumbar spine.  She might need a refill on her hydrocodone at some point in the next several weeks.  We will also monitor exam of her right shoulder for possible rotator cuff tear  Follow-Up Instructions: Return in about 2 weeks (around 03/02/2020).   Orders:  Orders Placed This Encounter  Procedures  . XR Shoulder  Right   No orders of the defined types were placed in this encounter.     Procedures: No procedures performed   Clinical Data: No additional findings.   Subjective: Chief Complaint  Patient presents with  . Neck - Pain  DOI 02/10/2020  Patient presents today with right sided neck and right shoulder pain. She fell from her porch on 02/10/2020 and landed on her head. She went to [facility] ED and was evaluated. She continues to have pain in her neck, right shoulder, and right rib area. She points to her anterior side of her shoulder as the area of pain. Upon lifting her arm she has pain into her ribs as well. No numbness or tingling in her upper extremities. She does state that she weakness in her right arm since the accident. She takes Hydrocodone, Robaxin, and Tylenol for pain. No previous surgery to either area. She states that she has shortness of breath due to pain when walking around. She has x-rays of her neck in the PACS system.  I did review films of her cervical spine, lumbar spine and chest on the PACS system.  I did not see any  obvious fractures.  There was evidence of a little endplate compression of L2 which is unchanged from prior films.  Her pain is along the right side in the lower rib cage.  There is been no flank pain.  No abdominal complaints.  No blood in her urine.  No nausea or vomiting.  She tolerates a normal diet  HPI  Review of Systems   Objective: Vital Signs: Ht 5\' 8"  (1.727 m)   Wt 195 lb (88.5 kg)   BMI 29.65 kg/m   Physical Exam Constitutional:      Appearance: She is well-developed.  Eyes:     Pupils: Pupils are equal, round, and reactive to light.  Pulmonary:     Effort: Pulmonary effort is normal.  Skin:    General: Skin is warm and dry.  Neurological:     Mental Status: She is alert and oriented to person, place, and time.  Psychiatric:        Behavior: Behavior normal.     Ortho Exam awake alert and oriented x3.  She did not have any  trouble breathing.  Had considerable pain along the mid axillary line of the lower chest and abdomen.  No flank pain.  No abdominal discomfort.  Straight leg raise negative.  No pain with range of motion of either hip.  There was some bruising to the right of her lumbar spine which is resolving.  Was some percussible tenderness of the lumbar spine diffusely.  Motor exam appeared to be intact.  There was some discomfort with range of motion of her right shoulder but no bruising or grating or crepitation.  She had full overhead motion although it was slow.  Films were negative. Specialty Comments:  No specialty comments available.  Imaging: XR Shoulder Right  Result Date: 02/17/2020 Films of the right shoulder obtained in several projections.  Humeral head is centered about the glenoid.  Normal space between the humeral head and the acromion.  Moderate degenerative changes at the acromioclavicular joint with some hypertrophy of the distal clavicle.  No ectopic calcification or evidence of fracture    PMFS History: Patient Active Problem List   Diagnosis Date Noted  . Chest pain 02/17/2020  . Gastroesophageal reflux disease 10/19/2019  . Hypertensive disorder 10/19/2019  . Third degree uterine prolapse 10/19/2019  . Spinal stenosis of lumbar region without neurogenic claudication 09/15/2019  . Urinary and fecal incontinence 09/15/2019  . Cystocele with rectocele 09/15/2019  . Radial styloid tenosynovitis (de quervain) 06/24/2019  . Trigger finger, right middle finger 06/24/2019  . Pain in left wrist 05/27/2019  . Trigger thumb, right thumb 10/29/2018  . Memory loss 07/22/2018  . Low back pain 07/22/2018  . Left lumbar radiculopathy 01/22/2018  . Mild cognitive impairment 10/22/2017  . Anxiety 10/22/2017  . Aortic atherosclerosis ([place]) 12/04/2016  . Ptosis of right eyelid 02/01/2016  . Dermatochalasis of both upper eyelids 03/07/2015  . Vitamin D deficiency 08/19/2014  . HSV (herpes  simplex virus) with ophthalmic complications 99991111  . Family history of colon cancer-father at 79+ grandparent w/ rectal cancer 01/14/2014  . MGD (meibomian gland dysfunction) 10/14/2013  . Injection of surface of eye 10/14/2013  . Osteopenia 08/12/2013  . CAD (coronary artery disease), native coronary artery 03/22/2013  . Aortic valve stenosis, mild 03/22/2013  . Hyperlipemia 03/11/2013  . Hypertension 03/11/2013  . Allergic rhinitis 03/11/2013  . Left shoulder pain 03/11/2013  . IBS (irritable bowel syndrome) - with chronic recurrent abdominal pain 02/01/2012  . Personal  history of colonic polyps - adenoma 02/01/2012   Past Medical History:  Diagnosis Date  . Adenomatous colon polyp   . Allergic drug reaction 06/25/2015  . Allergy   . Anxiety   . Arthritis   . At risk for falls    fallen 4 times recently   . CAD (coronary artery disease)    Shelly. Burt Knack follows   . Carpal tunnel syndrome   . Chronic headaches   . Coccydynia 02/01/2012  . Cystocele   . Diverticulosis   . External hemorrhoids   . Focal nodular hyperplasia of liver   . GERD (gastroesophageal reflux disease)    some  . HCAP (healthcare-associated pneumonia) 06/25/2015  . Heart murmur   . HLD (hyperlipidemia)   . HTN (hypertension)   . IBS (irritable bowel syndrome)   . Memory loss   . Neuromuscular disorder ([place])    some neuropathy issues in the past- legs fibromyalgia in past-- past hx of steroid injections  . Obesity   . Osteoporosis   . Otitis of left ear 02/01/2017  . Pneumonia   . Skin cancer    squamous cell - face    Family History  Problem Relation Age of Onset  . Prostate cancer Father   . Colon cancer Father 92  . Kidney disease Father   . Heart disease Father   . Alzheimer's disease Father   . Hyperlipidemia Mother   . Hypertension Mother   . Kidney disease Mother   . Osteoporosis Mother   . Heart murmur Mother   . Dementia Mother   . Hyperlipidemia Sister   . Hypertension Sister     . Heart disease Brother 67       not dx questionable   . Heart disease Maternal Grandfather   . Rectal cancer Maternal Grandmother 70  . Colon polyps Neg Hx   . Esophageal cancer Neg Hx   . Stomach cancer Neg Hx     Past Surgical History:  Procedure Laterality Date  . CARPAL TUNNEL RELEASE    . COLONOSCOPY  12/15/2008   diverticulosis, external hemorrhoids  . KNEE SURGERY  05/22/2015   right  . LAPAROSCOPY    . SKIN SURGERY     squamous cell - right face    Social History   Occupational History  . Occupation: Pharmacist, hospital retired  Tobacco Use  . Smoking status: Never Smoker  . Smokeless tobacco: Never Used  Substance and Sexual Activity  . Alcohol use: No  . Drug use: No  . Sexual activity: Not Currently    Birth control/protection: None

## 2020-02-22 ENCOUNTER — Telehealth: Payer: Self-pay | Admitting: Orthopaedic Surgery

## 2020-02-22 ENCOUNTER — Other Ambulatory Visit: Payer: Self-pay | Admitting: Orthopaedic Surgery

## 2020-02-22 MED ORDER — HYDROCODONE-ACETAMINOPHEN 5-325 MG PO TABS: 1.0000 | ORAL_TABLET | Freq: Four times a day (QID) | ORAL | 0 refills | Status: DC | PRN

## 2020-02-22 NOTE — Telephone Encounter (Signed)
Called and LMOM that medication has been sent to her pharmacy.

## 2020-02-22 NOTE — Telephone Encounter (Signed)
Sent via imprivata

## 2020-02-22 NOTE — Telephone Encounter (Signed)
Patient called and requested a refill of medication Hydrocodone 5-325. Patient stated was told by Dr. Durward Fortes he would refill this medication for her.  Please call patient @ [phone]

## 2020-02-22 NOTE — Telephone Encounter (Signed)
Please see below.

## 2020-03-02 ENCOUNTER — Ambulatory Visit: Payer: Medicare PPO | Admitting: Orthopaedic Surgery

## 2020-03-02 ENCOUNTER — Encounter: Payer: Self-pay | Admitting: Orthopaedic Surgery

## 2020-03-02 ENCOUNTER — Other Ambulatory Visit: Payer: Self-pay

## 2020-03-02 VITALS — Ht 68.0 in | Wt 195.0 lb

## 2020-03-02 DIAGNOSIS — M25511 Pain in right shoulder: Secondary | ICD-10-CM

## 2020-03-02 NOTE — Progress Notes (Signed)
Office Visit Note   Patient: Shelly Bennett           Date of Birth: May 31, 1951           MRN: [id number] Visit Date: 03/02/2020              Requested by: Dettinger, Fransisca Kaufmann, MD [place],  [place] [address] PCP: Dettinger, Fransisca Kaufmann, MD   Assessment & Plan: Visit Diagnoses:  1. Acute pain of right shoulder     Plan: Several weeks status post fall where she got wedged between pushing her brick wall at home.  Feeling much better.  No longer having any issues with her rib cage.  No abdominal complaints or bloody urine.  Still having some discomfort in her right shoulder but definitely better.  She can sleep on that side and raise it over her head.  [name] does have a chronic issue with balance and is seeing her neurologist and going to rehab.  She is using a rolling walker.  Still has positive impingement but feeling better.  I would suggest obtaining an MRI scan if no improvement with her shoulder over the next 3 to 4 weeks.  She also had inquired about a handicap parking sticker.  I think should be better off getting that from her primary care physician over the neurologist is really has a lot to do with her balance  Follow-Up Instructions: No follow-ups on file.   Orders:  No orders of the defined types were placed in this encounter.  No orders of the defined types were placed in this encounter.     Procedures: No procedures performed   Clinical Data: No additional findings.   Subjective: Chief Complaint  Patient presents with  . Right Shoulder - Follow-up  Patient presents today for a two week follow up on her right shoulder and rib pain. Patient states that her rib pain has improved a lot since her last visit. She is still having pain in her right shoulder and scapula area, but has noticed some improvement. She is taking 1/2 a tablet of hydrocodone as needed for pain control.   HPI  Review of Systems   Objective: Vital Signs: Ht 5\' 8"  (1.727 m)   Wt  195 lb (88.5 kg)   BMI 29.65 kg/m   Physical Exam Constitutional:      Appearance: She is well-developed.  Eyes:     Pupils: Pupils are equal, round, and reactive to light.  Pulmonary:     Effort: Pulmonary effort is normal.  Skin:    General: Skin is warm and dry.  Neurological:     Mental Status: She is alert and oriented to person, place, and time.  Psychiatric:        Behavior: Behavior normal.     Ortho Exam awake alert and oriented x3.  Comfortable sitting.  No distress.  Does use a rolling walker when she is out of the house.  No longer has any rib pain.  Abdomen was soft and nontender.  Able to place her right arm fully overhead.  Still has some tenderness along the anterior lateral subacromial region.  Biceps intact.  Good strength.  Good grip.  No crepitation.  No significant abdominal wall pain  Specialty Comments:  No specialty comments available.  Imaging: No results found.   PMFS History: Patient Active Problem List   Diagnosis Date Noted  . Chest pain 02/17/2020  . Gastroesophageal reflux disease 10/19/2019  . Hypertensive  disorder 10/19/2019  . Third degree uterine prolapse 10/19/2019  . Spinal stenosis of lumbar region without neurogenic claudication 09/15/2019  . Urinary and fecal incontinence 09/15/2019  . Cystocele with rectocele 09/15/2019  . Radial styloid tenosynovitis (de quervain) 06/24/2019  . Trigger finger, right middle finger 06/24/2019  . Pain in left wrist 05/27/2019  . Trigger thumb, right thumb 10/29/2018  . Memory loss 07/22/2018  . Low back pain 07/22/2018  . Left lumbar radiculopathy 01/22/2018  . Mild cognitive impairment 10/22/2017  . Anxiety 10/22/2017  . Aortic atherosclerosis ([place]) 12/04/2016  . Ptosis of right eyelid 02/01/2016  . Dermatochalasis of both upper eyelids 03/07/2015  . Vitamin D deficiency 08/19/2014  . 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 Family history of colon cancer-father  at 28+ grandparent w/ rectal cancer 01/14/2014  . MGD (meibomian gland dysfunction) 10/14/2013  . Injection of surface of eye 10/14/2013  . Osteopenia 08/12/2013  . CAD (coronary artery disease), native coronary artery 03/22/2013  . Aortic valve stenosis, mild 03/22/2013  . Hyperlipemia 03/11/2013  . Hypertension 03/11/2013  . Allergic rhinitis 03/11/2013  . Pain in right shoulder 03/11/2013  . IBS (irritable bowel syndrome) - with chronic recurrent abdominal pain 02/01/2012  . Personal history of colonic polyps - adenoma 02/01/2012   Past Medical History:  Diagnosis Date  . Adenomatous colon polyp   . Allergic drug reaction 06/25/2015  . Allergy   . Anxiety   . Arthritis   . At risk for falls    fallen 4 times recently   . CAD (coronary artery disease)    Dr. Burt Knack follows   . Carpal tunnel syndrome   . Chronic headaches   . Coccydynia 02/01/2012  . Cystocele   . Diverticulosis   . External hemorrhoids   . Focal nodular hyperplasia of liver   . GERD (gastroesophageal reflux disease)    some  . HCAP (healthcare-associated pneumonia) 06/25/2015  . Heart murmur   . HLD (hyperlipidemia)   . HTN (hypertension)   . IBS (irritable bowel syndrome)   . Memory loss   . Neuromuscular disorder ([place])    some neuropathy issues in the past- legs fibromyalgia in past-- past hx of steroid injections  . Obesity   . Osteoporosis   . Otitis of left ear 02/01/2017  . Pneumonia   . Skin cancer    squamous cell - face    Family History  Problem Relation Age of Onset  . Prostate cancer Father   . Colon cancer Father 110  . Kidney disease Father   . Heart disease Father   . Alzheimer's disease Father   . Hyperlipidemia Mother   . Hypertension Mother   . Kidney disease Mother   . Osteoporosis Mother   . Heart murmur Mother   . Dementia Mother   . Hyperlipidemia Sister   . Hypertension Sister   . Heart disease Brother 42       not dx questionable   . Heart disease Maternal  Grandfather   . Rectal cancer Maternal Grandmother 21  . Colon polyps Neg Hx   . Esophageal cancer Neg Hx   . Stomach cancer Neg Hx     Past Surgical History:  Procedure Laterality Date  . CARPAL TUNNEL RELEASE    . COLONOSCOPY  12/15/2008   diverticulosis, external hemorrhoids  . KNEE SURGERY  05/22/2015   right  . LAPAROSCOPY    . SKIN SURGERY     squamous cell - right  face    Social History   Occupational History  . Occupation: Pharmacist, hospital retired  Tobacco Use  . Smoking status: Never Smoker  . Smokeless tobacco: Never Used  Substance and Sexual Activity  . Alcohol use: No  . Drug use: No  . Sexual activity: Not Currently    Birth control/protection: None

## 2020-03-10 ENCOUNTER — Other Ambulatory Visit: Payer: Self-pay

## 2020-03-10 ENCOUNTER — Ambulatory Visit: Payer: Medicare PPO | Admitting: Neurology

## 2020-03-10 ENCOUNTER — Encounter: Payer: Self-pay | Admitting: Neurology

## 2020-03-10 VITALS — BP 165/90 | HR 71 | Temp 97.8°F | Ht 68.0 in | Wt 200.0 lb

## 2020-03-10 DIAGNOSIS — R269 Unspecified abnormalities of gait and mobility: Secondary | ICD-10-CM | POA: Diagnosis not present

## 2020-03-10 DIAGNOSIS — R413 Other amnesia: Secondary | ICD-10-CM | POA: Diagnosis not present

## 2020-03-10 NOTE — Progress Notes (Signed)
PATIENT: Shelly Bennett DOB: 07-14-1951  Chief Complaint  Patient presents with  . Gait Problem    She is here with her daughter, Shelly Bennett. She is concerned about worsening gait causing her to be off balance. Reports having falls. She is using a rolling walker to assist with ambulation.   . Memory Loss    MMSE 28/30 - 12 animals. She was last seen for memory concerns in 2019. Feels her memory loss has further declined.   . Headache    Denies any issues with headaches.  Marland Kitchen PCP    Dettinger, Fransisca Kaufmann, MD      HISTORICAL  Shelly Bennett is a 69 years old female, seen in refer by her primary care doctor Redge Gainer for evaluation of anxiety, memory loss, initial evaluation was on November 6th 2018.   I have reviewed and summarized the referring note, she has history of hypertension, coronary artery disease, hyperlipidemia, is a retired Automotive engineer. She drove herself to clinic today.  She reported stress, she is the main caretaker of her mother that is 57 years old, since 2017, she was noted to repeat herself, forget peoples name, displaced pains,  Since 2018, she also suffered depression anxiety, because of the strained relationship, she is taking BuSpar, and Paxil since August 2018 which seems to help her some,  She has mild gait abnormality due to previous left knee injury.   Laboratory evaluations, B12 474, normal liver functional tests, vitamin D level 57, lipid profile LDL 82, total cholesterol 169,  UPDATE Feb 6th 2019: She continue has mild memory loss, Mini-Mental Status Examination 28/30, MRI of the brain showed moderate generalized atrophy, ventriculomegaly mild supratentorium small vessel disease  She also complains of chronic left low back pain radiating pain to left hip, I have personally reviewed MRI lumbar in 2016, multilevel degenerative changes, most severe at L3-4, with moderate spinal stenosis, bilateral lateral recess  stenosis.  UPDATE August 6th 2019: She still has mild memory loss, misplace things, her mother passed away on 2018-05-20, her left leg pain, low back pain has much improved after physical therapy  UPDATE March 10 2020: She is accompanied by her daughter Shelly Bennett at today's visit, she had worsening gait abnormality, memory loss, initially contributed to her depression of losing her mother in 2020, but her symptoms continue to progress over the past 1 year, especially her gait abnormality, she fell few times in her yard, went to emergency room on February 10, 2020, fell about 4 feet off the edge of her porch, landed on her right side  She denies significant low back pain, denies radiating pain to bilateral lower extremity, or lower extremity paresthesia, denies bowel bladder incontinence   I personally reviewed MRI of the brain in December 2020, generalized atrophy, mild supratentorial and small vessel disease, there was no acute abnormalities.  Laboratory evaluations in December 2020, positive COVID-19, normal and active RPR, Lyme titer, B12, ESR, arthritis panel, vitamin D, CBC, CMP elevated high-sensitivity C-reactive protein 4.8, positive scleroderma antibody, normal thyroid functional test  I personally reviewed MRI of lumbar spine in December 2019, severe spinal stenosis at L3-4, due to circumferential disc bulging, moderate facet and ligamentum flavum hypertrophy, with progressive severe spinal stenosis, mild left greater than right lateral recess stenosis, acute mild L1 and 2 compression fracture  REVIEW OF SYSTEMS: Full 14 system review of systems performed and notable only for as above All other review of systems were negative.  ALLERGIES:  Allergies  Allergen Reactions  . Codeine     ? Reaction ER visit.   . Mold Extract [Trichophyton]     Migraine  . Penicillins Other (See Comments)    "drew my legs up as child" Pt has tolerated cephalexin in the past.  . Ace Inhibitors Cough   . Angiotensin Receptor Blockers Cough  . Levaquin [Levofloxacin] Rash    Per notes from outpatient provider  . Vytorin [Ezetimibe-Simvastatin] Other (See Comments)    cramping  . Zetia [Ezetimibe] Other (See Comments)    cramping    HOME MEDICATIONS: Current Outpatient Medications  Medication Sig Dispense Refill  . amLODipine (NORVASC) 10 MG tablet TAKE (1/2) TABLET DAILY. 45 tablet 3  . aspirin 81 MG tablet Take 81 mg by mouth daily.     Marland Kitchen buPROPion (WELLBUTRIN XL) 150 MG 24 hr tablet Take 1 tablet (150 mg total) by mouth daily. 90 tablet 3  . Calcium Carbonate (CALCARB 600 PO) Take by mouth.    . Cholecalciferol (VITAMIN D-3) 5000 UNITS TABS Take 1 capsule by mouth as directed. Take one capsule by mouth daily Mon thru Friday    . fexofenadine (ALLEGRA) 180 MG tablet Take 180 mg by mouth daily.    . fluticasone (FLONASE) 50 MCG/ACT nasal spray Place 2 sprays into both nostrils daily. 16 g 3  . hydrochlorothiazide (HYDRODIURIL) 25 MG tablet TAKE (1/2) TABLET DAILY. 45 tablet 3  . HYDROcodone-acetaminophen (NORCO/VICODIN) 5-325 MG tablet Take 1 tablet by mouth every 6 (six) hours as needed for moderate pain. 30 tablet 0  . montelukast (SINGULAIR) 10 MG tablet Take 1 tablet (10 mg total) by mouth at bedtime. 90 tablet 3  . omega-3 acid ethyl esters (LOVAZA) 1 g capsule TAKE (1) CAPSULE FOUR TIMES DAILY. 360 capsule 1  . rosuvastatin (CRESTOR) 20 MG tablet Take 1 tablet (20 mg total) by mouth daily. 90 tablet 3   No current facility-administered medications for this visit.    PAST MEDICAL HISTORY: Past Medical History:  Diagnosis Date  . Adenomatous colon polyp   . Allergic drug reaction 06/25/2015  . Allergy   . Anxiety   . Arthritis   . At risk for falls    fallen 4 times recently   . CAD (coronary artery disease)    Dr. Burt Knack follows   . Carpal tunnel syndrome   . Chronic headaches   . Coccydynia 02/01/2012  . Cystocele   . Diverticulosis   . External hemorrhoids   .  Focal nodular hyperplasia of liver   . Gait abnormality   . GERD (gastroesophageal reflux disease)    some  . HCAP (healthcare-associated pneumonia) 06/25/2015  . Heart murmur   . HLD (hyperlipidemia)   . HTN (hypertension)   . IBS (irritable bowel syndrome)   . Memory loss   . Neuromuscular disorder ([place])    some neuropathy issues in the past- legs fibromyalgia in past-- past hx of steroid injections  . Obesity   . Osteoporosis   . Otitis of left ear 02/01/2017  . Pneumonia   . Skin cancer    squamous cell - face    PAST SURGICAL HISTORY: Past Surgical History:  Procedure Laterality Date  . CARPAL TUNNEL RELEASE    . COLONOSCOPY  12/15/2008   diverticulosis, external hemorrhoids  . KNEE SURGERY  05/22/2015   right  . LAPAROSCOPY    . SKIN SURGERY     squamous cell - right face     FAMILY HISTORY: Family  History  Problem Relation Age of Onset  . Prostate cancer Father   . Colon cancer Father 10  . Kidney disease Father   . Heart disease Father   . Alzheimer's disease Father   . Hyperlipidemia Mother   . Hypertension Mother   . Kidney disease Mother   . Osteoporosis Mother   . Heart murmur Mother   . Dementia Mother   . Hyperlipidemia Sister   . Hypertension Sister   . Heart disease Brother 67       not dx questionable   . Heart disease Maternal Grandfather   . Rectal cancer Maternal Grandmother 52  . Colon polyps Neg Hx   . Esophageal cancer Neg Hx   . Stomach cancer Neg Hx     SOCIAL HISTORY: Social History   Socioeconomic History  . Marital status: Married    Spouse name: [name]  . Number of children: 4  . Years of education: 16  . Highest education level: Bachelor's degree (e.g., BA, AB, BS)  Occupational History  . Occupation: Pharmacist, hospital retired  Tobacco Use  . Smoking status: Never Smoker  . Smokeless tobacco: Never Used  Substance and Sexual Activity  . Alcohol use: No  . Drug use: No  . Sexual activity: Not Currently    Birth  control/protection: None  Other Topics Concern  . Not on file  Social History Narrative   Lives at home with her husband.   Right-handed.   1cup coffee each morning. 2-3 small soda cans each day.   Social Determinants of Health   Financial Resource Strain:   . Difficulty of Paying Living Expenses:   Food Insecurity:   . Worried About Charity fundraiser in the Last Year:   . Arboriculturist in the Last Year:   Transportation Needs:   . Film/video editor (Medical):   Marland Kitchen Lack of Transportation (Non-Medical):   Physical Activity: Unknown  . Days of Exercise per Week: Not on file  . Minutes of Exercise per Session: 0 min  Stress:   . Feeling of Stress :   Social Connections:   . Frequency of Communication with Friends and Family:   . Frequency of Social Gatherings with Friends and Family:   . Attends Religious Services:   . Active Member of Clubs or Organizations:   . Attends Archivist Meetings:   Marland Kitchen Marital Status:   Intimate Partner Violence:   . Fear of Current or Ex-Partner:   . Emotionally Abused:   Marland Kitchen Physically Abused:   . Sexually Abused:      PHYSICAL EXAM   Vitals:   03/10/20 1500  BP: (!) 165/90  Pulse: 71  Temp: 97.8 F (36.6 C)  Weight: 200 lb (90.7 kg)  Height: 5' 8"  (1.727 m)    Not recorded      Body mass index is 30.41 kg/m.  PHYSICAL EXAMNIATION:  Gen: NAD, conversant, well nourised, well groomed                     Cardiovascular: Regular rate rhythm, no peripheral edema, warm, nontender. Eyes: Conjunctivae clear without exudates or hemorrhage Neck: Supple, no carotid bruits. Pulmonary: Clear to auscultation bilaterally   NEUROLOGICAL EXAM:  MMSE - Mini Mental State Exam 03/10/2020 02/05/2019 01/30/2018  Orientation to time 5 4 5   Orientation to Place 5 5 5   Registration 3 3 3   Attention/ Calculation 5 5 5   Recall 1 3 3   Language- name  2 objects 2 2 2   Language- repeat 1 1 1   Language- follow 3 step command 3 3 3    Language- read & follow direction 1 1 1   Write a sentence 1 1 1   Copy design 1 1 1   Total score 28 29 30   animal naming 12   CRANIAL NERVES: CN II: Visual fields are full to confrontation. Pupils are round equal and briskly reactive to light. CN III, IV, VI: extraocular movement are normal. No ptosis. CN V: Facial sensation is intact to light touch CN VII: Face is symmetric with normal eye closure  CN VIII: Hearing is normal to causal conversation. CN IX, X: Phonation is normal. CN XI: Head turning and shoulder shrug are intact  MOTOR: There is no pronator drift of out-stretched arms. Muscle bulk and tone are normal. Muscle strength is normal.  REFLEXES: Reflexes are 2+ and symmetric at the biceps, triceps, 3/3 knees, and ankles. Plantar responses are flexor.  SENSORY: Intact to light touch, pinprick and vibratory sensation are intact in fingers and toes.  COORDINATION: There is no trunk or limb dysmetria noted.  GAIT/STANCE: She needs push-up to get up from seated position, wide-based, stiff, cautious gait  DIAGNOSTIC DATA (LABS, IMAGING, TESTING) - I reviewed patient records, labs, notes, testing and imaging myself where available.   ASSESSMENT AND PLAN  Jayline Kilburg is a 70 y.o. female   Worsening gait abnormality  Hyperreflexia on examinations  MRI of cervical spine to rule out cervical myelopathy  Worsening memory loss  Mini-Mental Status Examination is 28/30 today,  Compared to MRI of the brain in December 2020 to November 2018, there was no significant change  Laboratory evaluation showed no treatable etiology, negative RPR, normal B12, TSH  Marcial Pacas, M.D. Ph.D.  [facility] [address] [place], [place] [address] Ph: [phone] Fax: [phone]  CC: Dettinger, Fransisca Kaufmann, MD

## 2020-03-14 ENCOUNTER — Telehealth: Payer: Self-pay | Admitting: Neurology

## 2020-03-14 NOTE — Telephone Encounter (Signed)
[employer] pending faxed notes.  

## 2020-03-16 NOTE — Telephone Encounter (Signed)
Mcarthur Rossetti Josem Kaufmann: [id number] (exp. 03/14/20 to 04/13/20) order sent to GI. They will reach out to the patient to schedule.

## 2020-03-23 ENCOUNTER — Other Ambulatory Visit: Payer: Self-pay

## 2020-03-23 ENCOUNTER — Telehealth: Payer: Self-pay | Admitting: Orthopaedic Surgery

## 2020-03-23 DIAGNOSIS — G8929 Other chronic pain: Secondary | ICD-10-CM

## 2020-03-23 NOTE — Telephone Encounter (Signed)
Patient called requesting a call back from Dr. Rudene Anda nurse. Patient did not give a reason for call back. Patient phone number is [phone] or [phone].

## 2020-03-23 NOTE — Telephone Encounter (Signed)
Patient is continuing to have pain in her right shoulder. She would like to proceed with an MRI. Your dictation states that it was okay to order if no improvement in 3-4weeks. I have placed order and patient is aware.

## 2020-03-24 NOTE — Telephone Encounter (Signed)
thanks

## 2020-04-08 DIAGNOSIS — B0050 Herpesviral ocular disease, unspecified: Secondary | ICD-10-CM | POA: Diagnosis not present

## 2020-04-08 DIAGNOSIS — H02403 Unspecified ptosis of bilateral eyelids: Secondary | ICD-10-CM | POA: Diagnosis not present

## 2020-04-08 DIAGNOSIS — H2513 Age-related nuclear cataract, bilateral: Secondary | ICD-10-CM | POA: Diagnosis not present

## 2020-04-19 ENCOUNTER — Other Ambulatory Visit: Payer: Self-pay

## 2020-04-19 ENCOUNTER — Ambulatory Visit (HOSPITAL_COMMUNITY)
Admission: RE | Admit: 2020-04-19 | Discharge: 2020-04-19 | Disposition: A | Discharge status: Home / Self Care | Payer: Medicare PPO | Source: Ambulatory Visit | Attending: Orthopaedic Surgery | Admitting: Orthopaedic Surgery

## 2020-04-19 DIAGNOSIS — G8929 Other chronic pain: Secondary | ICD-10-CM | POA: Insufficient documentation

## 2020-04-19 DIAGNOSIS — M25511 Pain in right shoulder: Secondary | ICD-10-CM

## 2020-04-25 ENCOUNTER — Other Ambulatory Visit: Payer: Self-pay

## 2020-04-25 ENCOUNTER — Ambulatory Visit
Admission: RE | Admit: 2020-04-25 | Discharge: 2020-04-25 | Disposition: A | Discharge status: Home / Self Care | Payer: Medicare PPO | Source: Ambulatory Visit | Attending: Neurology | Admitting: Neurology

## 2020-04-25 ENCOUNTER — Other Ambulatory Visit: Payer: Medicare PPO

## 2020-04-25 DIAGNOSIS — R260 Ataxic gait: Secondary | ICD-10-CM | POA: Diagnosis not present

## 2020-04-25 DIAGNOSIS — R269 Unspecified abnormalities of gait and mobility: Secondary | ICD-10-CM

## 2020-04-25 DIAGNOSIS — R413 Other amnesia: Secondary | ICD-10-CM

## 2020-04-27 ENCOUNTER — Other Ambulatory Visit: Payer: Self-pay

## 2020-04-27 ENCOUNTER — Encounter: Payer: Self-pay | Admitting: Orthopaedic Surgery

## 2020-04-27 ENCOUNTER — Ambulatory Visit: Payer: Medicare PPO | Admitting: Orthopaedic Surgery

## 2020-04-27 ENCOUNTER — Telehealth: Payer: Self-pay | Admitting: Neurology

## 2020-04-27 VITALS — Ht 68.0 in | Wt 200.0 lb

## 2020-04-27 DIAGNOSIS — Z01419 Encounter for gynecological examination (general) (routine) without abnormal findings: Secondary | ICD-10-CM | POA: Diagnosis not present

## 2020-04-27 DIAGNOSIS — G8929 Other chronic pain: Secondary | ICD-10-CM

## 2020-04-27 DIAGNOSIS — R309 Painful micturition, unspecified: Secondary | ICD-10-CM | POA: Diagnosis not present

## 2020-04-27 DIAGNOSIS — M25511 Pain in right shoulder: Secondary | ICD-10-CM | POA: Diagnosis not present

## 2020-04-27 DIAGNOSIS — R351 Nocturia: Secondary | ICD-10-CM | POA: Diagnosis not present

## 2020-04-27 DIAGNOSIS — N8110 Cystocele, unspecified: Secondary | ICD-10-CM | POA: Diagnosis not present

## 2020-04-27 DIAGNOSIS — N8111 Cystocele, midline: Secondary | ICD-10-CM | POA: Diagnosis not present

## 2020-04-27 DIAGNOSIS — Z1231 Encounter for screening mammogram for malignant neoplasm of breast: Secondary | ICD-10-CM | POA: Diagnosis not present

## 2020-04-27 MED ORDER — HYDROCODONE-ACETAMINOPHEN 5-325 MG PO TABS: 1.0000 | ORAL_TABLET | Freq: Four times a day (QID) | ORAL | 0 refills | Status: DC | PRN

## 2020-04-27 NOTE — Telephone Encounter (Signed)
Left message ( ok per dpr) advising of results. Pt advised to call back if she had any questions.

## 2020-04-27 NOTE — Telephone Encounter (Signed)
IMPRESSION:   MRI cervical spine (without) demonstrating: - At C3-4: disc protrusion and uncovertebral joint hypertrophy with severe biforaminal stenosis  - At C4-5: uncovertebral joint hypertrophy and facet hypertrophy with mild biforaminal stenosis  - At C5-6: disc bulging and uncovertebral joint hypertrophy with mild biforaminal stenosis and borderline mild spinal stenosis   Please call patient, MRI of cervical spine showed multilevel degenerative changes, but there was no significant canal or foraminal narrowing,

## 2020-04-27 NOTE — Progress Notes (Signed)
Office Visit Note   Patient: Shelly Bennett           Date of Birth: 09-Sep-1951           MRN: [id number] Visit Date: 04/27/2020              Requested by: Dettinger, Fransisca Kaufmann, MD [place],  [place] [address] PCP: Dettinger, Fransisca Kaufmann, MD   Assessment & Plan: Visit Diagnoses:  1. 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  There was some limited retraction of the articular fibers of about 14 mm and the tear was about 7 mm wide.  There was no discrete full-thickness tear.  Muscles were unremarkable as was the biceps long head.  There was severe AC joint arthropathy and a type I acromion.  There was mild lateral downsloping but no undersurface spurring.  Labrum was intact.  Mild degenerative changes the glenohumeral joint.  Discussed this in detail with Shelly Bennett and her daughter by phone.  I think the best approach would be a course of physical therapy at [facility] facility in [place] and try Voltaren gel.  She does have a chronic issue with balance and is being followed by the neurologist.  She uses a rolling walker which may aggravate her shoulder pain.  We will see her back in 4 to 6 weeks and consider a subacromial cortisone injection.  She has had significant problems with vasovagal reaction in the past so I am trying to be very careful.  She will try ice or heat and Voltaren gel.  I will renew her prescription for hydrocodone.  She follows up with a neurologist in July.  I would like to see her in about 6 weeks  Follow-Up Instructions: Return in about 6 weeks (around 06/08/2020).   Orders:  Orders Placed This Encounter  Procedures  . Ambulatory referral to Physical Therapy   No orders of the defined types were placed in this encounter.     Procedures: No procedures performed   Clinical Data: No additional findings.   Subjective: Chief Complaint  Patient presents with  .  Right Shoulder - Follow-up    MRI results  Patient presents today for follow up on her right shoulder. She had an MRI done on 04/19/2020. She states that there has been no changes since her last visit.  Still having some trouble sleeping on her right side and raising her arm over her head but she is overall little better since her fall a month or 2 ago.  Also seeing the neurologist for memory loss and balance.  She has had a MRI of her brain and her cervical spine  HPI  Review of Systems   Objective: Vital Signs: Ht 5\' 8"  (1.727 m)   Wt 200 lb (90.7 kg)   BMI 30.41 kg/m   Physical Exam Constitutional:      Appearance: She is well-developed.  Eyes:     Pupils: Pupils are equal, round, and reactive to light.  Pulmonary:     Effort: Pulmonary effort is normal.  Skin:    General: Skin is warm and dry.  Neurological:     Mental Status: She is alert and oriented to person, place, and time.  Psychiatric:        Behavior: Behavior normal.     Ortho Exam right shoulder with minimally positive impingement.  Some tenderness of  the's anterior subacromial region and the [facility] joint.  A little bit of prominence of the [facility] joint consistent with MRI scan with severe arthritis.  Biceps appears to be intact.  No grating or crepitation.  But she had good strength with internal and external rotation good grip and release Specialty Comments:  No specialty comments available.  Imaging: No results found.   PMFS History: Patient Active Problem List   Diagnosis Date Noted  . Gait abnormality 03/10/2020  . Chest pain 02/17/2020  . Gastroesophageal reflux disease 10/19/2019  . Hypertensive disorder 10/19/2019  . Third degree uterine prolapse 10/19/2019  . Spinal stenosis of lumbar region without neurogenic claudication 09/15/2019  . Urinary and fecal incontinence 09/15/2019  . Cystocele with rectocele 09/15/2019  . Radial styloid tenosynovitis (de quervain) 06/24/2019  . Trigger finger, right  middle finger 06/24/2019  . Pain in left wrist 05/27/2019  . Trigger thumb, right thumb 10/29/2018  . Memory loss 07/22/2018  . Low back pain 07/22/2018  . Left lumbar radiculopathy 01/22/2018  . Mild cognitive impairment 10/22/2017  . Anxiety 10/22/2017  . Aortic atherosclerosis ([place]) 12/04/2016  . Ptosis of right eyelid 02/01/2016  . Dermatochalasis of both upper eyelids 03/07/2015  . Vitamin D deficiency 08/19/2014  . 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 Family history of colon cancer-father at 48+ grandparent w/ rectal cancer 01/14/2014  . MGD (meibomian gland dysfunction) 10/14/2013  . Injection of surface of eye 10/14/2013  . Osteopenia 08/12/2013  . CAD (coronary artery disease), native coronary artery 03/22/2013  . Aortic valve stenosis, mild 03/22/2013  . Hyperlipemia 03/11/2013  . Hypertension 03/11/2013  . Allergic rhinitis 03/11/2013  . Pain in right shoulder 03/11/2013  . IBS (irritable bowel syndrome) - with chronic recurrent abdominal pain 02/01/2012  . Personal history of colonic polyps - adenoma 02/01/2012   Past Medical History:  Diagnosis Date  . Adenomatous colon polyp   . Allergic drug reaction 06/25/2015  . Allergy   . Anxiety   . Arthritis   . At risk for falls    fallen 4 times recently   . CAD (coronary artery disease)    Dr. Burt Knack follows   . Carpal tunnel syndrome   . Chronic headaches   . Coccydynia 02/01/2012  . Cystocele   . Diverticulosis   . External hemorrhoids   . Focal nodular hyperplasia of liver   . Gait abnormality   . GERD (gastroesophageal reflux disease)    some  . HCAP (healthcare-associated pneumonia) 06/25/2015  . Heart murmur   . HLD (hyperlipidemia)   . HTN (hypertension)   . IBS (irritable bowel syndrome)   . Memory loss   . Neuromuscular disorder ([place])    some neuropathy issues in the past- legs fibromyalgia in past-- past hx of steroid injections  . Obesity   . Osteoporosis   .  Otitis of left ear 02/01/2017  . Pneumonia   . Skin cancer    squamous cell - face    Family History  Problem Relation Age of Onset  . Prostate cancer Father   . Colon cancer Father 12  . Kidney disease Father   . Heart disease Father   . Alzheimer's disease Father   . Hyperlipidemia Mother   . Hypertension Mother   . Kidney disease Mother   . Osteoporosis Mother   . Heart murmur Mother   . Dementia Mother   . Hyperlipidemia Sister   . Hypertension Sister   . Heart disease  Brother 22       not dx questionable   . Heart disease Maternal Grandfather   . Rectal cancer Maternal Grandmother 50  . Colon polyps Neg Hx   . Esophageal cancer Neg Hx   . Stomach cancer Neg Hx     Past Surgical History:  Procedure Laterality Date  . CARPAL TUNNEL RELEASE    . COLONOSCOPY  12/15/2008   diverticulosis, external hemorrhoids  . KNEE SURGERY  05/22/2015   right  . LAPAROSCOPY    . SKIN SURGERY     squamous cell - right face    Social History   Occupational History  . Occupation: Pharmacist, hospital retired  Tobacco Use  . Smoking status: Never Smoker  . Smokeless tobacco: Never Used  Substance and Sexual Activity  . Alcohol use: No  . Drug use: No  . Sexual activity: Not Currently    Birth control/protection: None

## 2020-05-04 ENCOUNTER — Ambulatory Visit: Payer: Medicare PPO | Admitting: Physical Therapy

## 2020-05-09 ENCOUNTER — Ambulatory Visit: Payer: Medicare PPO | Attending: Orthopaedic Surgery | Admitting: Physical Therapy

## 2020-05-09 ENCOUNTER — Encounter: Payer: Self-pay | Admitting: Physical Therapy

## 2020-05-09 ENCOUNTER — Other Ambulatory Visit: Payer: Self-pay

## 2020-05-09 DIAGNOSIS — M6281 Muscle weakness (generalized): Secondary | ICD-10-CM | POA: Insufficient documentation

## 2020-05-09 DIAGNOSIS — M25611 Stiffness of right shoulder, not elsewhere classified: Secondary | ICD-10-CM | POA: Insufficient documentation

## 2020-05-09 DIAGNOSIS — M25511 Pain in right shoulder: Secondary | ICD-10-CM | POA: Insufficient documentation

## 2020-05-09 NOTE — Therapy (Signed)
[facility] [place], [place], [address] Phone: [phone]   Fax:  [phone]  Physical Therapy Evaluation  Patient Details  Name: Shelly Bennett MRN: [id number] Date of Birth: 11-Jul-1951 Referring Provider (PT): Joni Fears, MD   Encounter Date: 05/09/2020  PT End of Session - 05/09/20 1330    Visit Number  1    Number of Visits  12    Date for PT Re-Evaluation  06/27/20    Authorization Type  progress note every 10th visit, KX modifier at 15th visit    Authorization Time Period  (6 visits used from previous episode of care; in 2021 calendar year)    PT Start Time  1115    PT Stop Time  1158    PT Time Calculation (min)  43 min    Equipment Utilized During Treatment  Other (comment)   SPC   Activity Tolerance  Patient limited by pain    Behavior During Therapy  [facility] for tasks assessed/performed       Past Medical History:  Diagnosis Date  . Adenomatous colon polyp   . Allergic drug reaction 06/25/2015  . Allergy   . Anxiety   . Arthritis   . At risk for falls    fallen 4 times recently   . CAD (coronary artery disease)    Dr. Burt Knack follows   . Carpal tunnel syndrome   . Chronic headaches   . Coccydynia 02/01/2012  . Cystocele   . Diverticulosis   . External hemorrhoids   . Focal nodular hyperplasia of liver   . Gait abnormality   . GERD (gastroesophageal reflux disease)    some  . HCAP (healthcare-associated pneumonia) 06/25/2015  . Heart murmur   . HLD (hyperlipidemia)   . HTN (hypertension)   . IBS (irritable bowel syndrome)   . Memory loss   . Neuromuscular disorder ([place])    some neuropathy issues in the past- legs fibromyalgia in past-- past hx of steroid injections  . Obesity   . Osteoporosis   . Otitis of left ear 02/01/2017  . Pneumonia   . Skin cancer    squamous cell - face    Past Surgical History:  Procedure Laterality Date  . CARPAL TUNNEL RELEASE    . COLONOSCOPY   12/15/2008   diverticulosis, external hemorrhoids  . KNEE SURGERY  05/22/2015   right  . LAPAROSCOPY    . SKIN SURGERY     squamous cell - right face     There were no vitals filed for this visit.   Subjective Assessment - 05/09/20 1319    Subjective  COVID-19 screen performed upon arrival. Patient arrives to physical therapy with reports of right shoulder pain and loss of range of motion due to a fall off her porch on 01/21/2020. Patient reports she has a small tear in her rotator cuff but not large enough for surgery per MD. Patient reports pain with ADLs, pushing up to standing from a chair, and activities involving overhead motions. Patient reports gaining assistance for home activities from her husband when her pain too high. Patient reports pain at worst as 7-8/10 and pain at best as 1-2/10 with rest and medication. Patient's goals are to decrease pain, improve movement, improve strength, have less difficulties with home activities and return to gardening.    Pertinent History  HTN, heart murmur, IBS, neuropathy, CAD, osteoporosis, memory loss    Limitations  House hold activities;Lifting    Diagnostic tests  MRI: RTC tendonopathy/tendonisis, articular surface tearing of supraspinatus tendon, AC joint OA, subacromical/subdelotid bursitis, see imaging    Patient Stated Goals  decrease pain, improve arm mobility    Currently in Pain?  Yes    Pain Score  6     Pain Location  Shoulder    Pain Orientation  Right    Pain Descriptors / Indicators  Aching;Sore    Pain Type  Acute pain    Pain Onset  1 to 4 weeks ago    Pain Frequency  Constant    Aggravating Factors   "a lot of movement"    Pain Relieving Factors  "medicine" voltaren gel, tylenol PRN    Effect of Pain on Daily Activities  difficult to perform certain activities, needs husband to assist.         [facility] PT Assessment - 05/09/20 0001      Assessment   Medical Diagnosis  Chronic right shoulder pain    Referring Provider  (PT)  Joni Fears, MD    Onset Date/Surgical Date  --   April 2021, fall   Next MD Visit  June 2021    Prior Therapy  not for shoulder      Precautions   Precautions  Fall      Restrictions   Weight Bearing Restrictions  No      Balance Screen   Has the patient fallen in the past 6 months  Yes    How many times?  10+    Has the patient had a decrease in activity level because of a fear of falling?   Yes    Is the patient reluctant to leave their home because of a fear of falling?   Yes      [place]  Private residence    Living Arrangements  Spouse/significant other      Prior Function   Level of Independence  Needs assistance with ADLs;Needs assistance with homemaking;Independent with basic ADLs      Posture/Postural Control   Posture/Postural Control  Postural limitations    Postural Limitations  Rounded Shoulders;Forward head      ROM / Strength   AROM / PROM / Strength  AROM;PROM      AROM   Overall AROM   Deficits;Due to pain    AROM Assessment Site  Shoulder    Right/Left Shoulder  Right    Right Shoulder Flexion  121 Degrees    Right Shoulder ABduction  110 Degrees    Right Shoulder Internal Rotation  70 Degrees    Right Shoulder External Rotation  58 Degrees      PROM   Overall PROM   Deficits;Due to pain    PROM Assessment Site  Shoulder    Right/Left Shoulder  Right    Right Shoulder Flexion  130 Degrees    Right Shoulder ABduction  105 Degrees    Right Shoulder Internal Rotation  72 Degrees    Right Shoulder External Rotation  68 Degrees      Strength   Strength Assessment Site  Shoulder    Right/Left Shoulder  Right    Right Shoulder Flexion  3/5    Right Shoulder ABduction  3-/5    Right Shoulder Internal Rotation  4-/5    Right Shoulder External Rotation  4-/5      Palpation   Palpation comment  tenderness to palpation to medial border of right scapula, anterior shoulder around biceps region and lateral  shoulder       Special Tests    Special Tests  Rotator Cuff Impingement    Rotator Cuff Impingment tests  Painful Arc of Motion;Empty Can test      Empty Can test   Findings  Positive    Side  Right      Painful Arc of Motion   Findings  Positive    Side  Right      Ambulation/Gait   Assistive device  Straight cane    Gait Pattern  Step-to pattern;Decreased arm swing - right;Decreased stride length;Narrow base of support;Decreased step length - left;Poor foot clearance - left    Gait Comments  --                  Objective measurements completed on examination: See above findings.      [facility] Adult PT Treatment/Exercise - 05/09/20 0001      Modalities   Modalities  Electrical Stimulation;Moist Heat      Moist Heat Therapy   Number Minutes Moist Heat  10 Minutes    Moist Heat Location  Shoulder      Electrical Stimulation   Electrical Stimulation Location  right shoulder    Electrical Stimulation Action  IFC    Electrical Stimulation Parameters  80-150 hz x10 mins    Electrical Stimulation Goals  Pain             PT Education - 05/09/20 1329    Education Details  supine wand: press ups, AAROM shoulder flexion, AAROM ER    Person(s) Educated  Patient;Caregiver(s)    Methods  Explanation;Handout;Demonstration    Comprehension  Verbalized understanding       PT Short Term Goals - 11/24/19 1312      PT SHORT TERM GOAL #1   Title  STG=LTG        PT Long Term Goals - 05/09/20 2008      PT LONG TERM GOAL #1   Title  Patient will be independent with HEP    Time  6    Period  Weeks    Status  New      PT LONG TERM GOAL #2   Title  Patinet will demonstrate 130+ degrees of right shoulder flexion AROM to improve ability to perform overhead tasks and dressing activities.    Time  6    Period  Weeks    Status  New      PT LONG TERM GOAL #3   Title  Patient will improver right shoulder MMT to 4+/5 or greater to improve stability during functional tasks.     Time  6    Period  Weeks    Status  New      PT LONG TERM GOAL #4   Title  Patient will report ability to perform ADLs, home tasks, and gardening with right shoulder pain less than or equal to 3/10    Baseline  --    Time  6    Period  Days    Status  New             Plan - 05/09/20 1332    Clinical Impression Statement  Patient is a 69 year old female who presents to physical therapy with right shoulder pain, decreased right shoulder ROM, and decreased R shoulder MMT secondary to fall on 01/21/2020. History taken from ED note as patient's recall of history varies from ED notes. Patient very tender to palpation to right medial  border of the scapula, right supraspinatus, and right greater tubercle. Patient and PT discussed plan of care as well as HEP to which patient reported understanding. Patient would benefit from skilled physical therapy to address deficits and address patient's goals.    Personal Factors and Comorbidities  Time since onset of injury/illness/exacerbation;Comorbidity 3+;Age;Transportation    Comorbidities  HTN, heart murmur, IBS, neuropathy, CAD, osteoporosis, memory loss    Examination-Activity Limitations  Dressing;Hygiene/Grooming;Carry;Lift;Reach Overhead;Transfers    Examination-Participation Restrictions  Meal Prep;Laundry;Yard Work    Stability/Clinical Decision Making  Stable/Uncomplicated    Designer, jewellery  Low    Rehab Potential  Good    PT Frequency  2x / week    PT Duration  6 weeks    PT Treatment/Interventions  ADLs/Self Care Home Management;Biofeedback;Cryotherapy;Software engineer;Therapeutic activities;Therapeutic exercise;Balance training;Neuromuscular re-education;Patient/family education;Manual techniques;Passive range of motion;Dry needling;Taping;Ultrasound;Iontophoresis 4mg /ml Dexamethasone    PT Next Visit Plan  pulleys, seated ranger for R shoulder, US/Combo as needed for pain relief, modalities as  needed.    PT Home Exercise Plan  see patient education    Consulted and Agree with Plan of Care  Patient       Patient will benefit from skilled therapeutic intervention in order to improve the following deficits and impairments:  Pain, Decreased strength, Decreased range of motion, Decreased balance, Impaired UE functional use, Decreased safety awareness, Postural dysfunction  Visit Diagnosis: Acute pain of right shoulder - Plan: PT plan of care cert/re-cert  Stiffness of right shoulder, not elsewhere classified - Plan: PT plan of care cert/re-cert  Muscle weakness (generalized) - Plan: PT plan of care cert/re-cert     Problem List Patient Active Problem List   Diagnosis Date Noted  . Gait abnormality 03/10/2020  . Chest pain 02/17/2020  . Gastroesophageal reflux disease 10/19/2019  . Hypertensive disorder 10/19/2019  . Third degree uterine prolapse 10/19/2019  . Spinal stenosis of lumbar region without neurogenic claudication 09/15/2019  . Urinary and fecal incontinence 09/15/2019  . Cystocele with rectocele 09/15/2019  . Radial styloid tenosynovitis (de quervain) 06/24/2019  . Trigger finger, right middle finger 06/24/2019  . Pain in left wrist 05/27/2019  . Trigger thumb, right thumb 10/29/2018  . Memory loss 07/22/2018  . Low back pain 07/22/2018  . Left lumbar radiculopathy 01/22/2018  . Mild cognitive impairment 10/22/2017  . Anxiety 10/22/2017  . Aortic atherosclerosis ([place]) 12/04/2016  . Ptosis of right eyelid 02/01/2016  . Dermatochalasis of both upper eyelids 03/07/2015  . Vitamin D deficiency 08/19/2014  . 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 Family history of colon cancer-father at 21+ grandparent w/ rectal cancer 01/14/2014  . MGD (meibomian gland dysfunction) 10/14/2013  . Injection of surface of eye 10/14/2013  . Osteopenia 08/12/2013  . CAD (coronary artery disease), native coronary artery 03/22/2013  . Aortic valve  stenosis, mild 03/22/2013  . Hyperlipemia 03/11/2013  . Hypertension 03/11/2013  . Allergic rhinitis 03/11/2013  . Pain in right shoulder 03/11/2013  . IBS (irritable bowel syndrome) - with chronic recurrent abdominal pain 02/01/2012  . Personal history of colonic polyps - adenoma 02/01/2012    Gabriela Eves, PT, DPT 05/09/2020, 8:21 PM  [facility] [address] [place], [place], [address] Phone: [phone]   Fax:  [phone]  Name: Tonjia Vivas MRN: [id number] Date of Birth: 06-Feb-1951

## 2020-05-12 ENCOUNTER — Telehealth: Payer: Self-pay

## 2020-05-12 ENCOUNTER — Other Ambulatory Visit: Payer: Self-pay

## 2020-05-12 ENCOUNTER — Encounter: Payer: Self-pay | Admitting: Physical Therapy

## 2020-05-12 ENCOUNTER — Ambulatory Visit: Payer: Medicare PPO | Admitting: Physical Therapy

## 2020-05-12 DIAGNOSIS — M25511 Pain in right shoulder: Secondary | ICD-10-CM

## 2020-05-12 DIAGNOSIS — M25611 Stiffness of right shoulder, not elsewhere classified: Secondary | ICD-10-CM

## 2020-05-12 DIAGNOSIS — M6281 Muscle weakness (generalized): Secondary | ICD-10-CM | POA: Diagnosis not present

## 2020-05-12 NOTE — Therapy (Signed)
[facility] [place], [place], [address] Phone: [phone]   Fax:  [phone]  Physical Therapy Treatment  Patient Details  Name: Sirat Pietrzyk MRN: [id number] Date of Birth: [birth year] Referring Provider (PT): Joni Fears, MD   Encounter Date: 05/12/2020  PT End of Session - 05/12/20 1638    Visit Number  2    Number of Visits  12    Date for PT Re-Evaluation  06/27/20    Authorization Type  progress note every 10th visit, KX modifier at 15th visit    Authorization Time Period  (6 visits used from previous episode of care; in 2021 calendar year)    PT Start Time  0152    PT Stop Time  0241    PT Time Calculation (min)  49 min    Activity Tolerance  Patient limited by pain    Behavior During Therapy  [facility] for tasks assessed/performed       Past Medical History:  Diagnosis Date  . Adenomatous colon polyp   . Allergic drug reaction 06/25/2015  . Allergy   . Anxiety   . Arthritis   . At risk for falls    fallen 4 times recently   . CAD (coronary artery disease)    Dr. Burt Knack follows   . Carpal tunnel syndrome   . Chronic headaches   . Coccydynia 02/01/2012  . Cystocele   . Diverticulosis   . External hemorrhoids   . Focal nodular hyperplasia of liver   . Gait abnormality   . GERD (gastroesophageal reflux disease)    some  . HCAP (healthcare-associated pneumonia) 06/25/2015  . Heart murmur   . HLD (hyperlipidemia)   . HTN (hypertension)   . IBS (irritable bowel syndrome)   . Memory loss   . Neuromuscular disorder ([place])    some neuropathy issues in the past- legs fibromyalgia in past-- past hx of steroid injections  . Obesity   . Osteoporosis   . Otitis of left ear 02/01/2017  . Pneumonia   . Skin cancer    squamous cell - face    Past Surgical History:  Procedure Laterality Date  . CARPAL TUNNEL RELEASE    . COLONOSCOPY  12/15/2008   diverticulosis, external hemorrhoids  . KNEE SURGERY   05/22/2015   right  . LAPAROSCOPY    . SKIN SURGERY     squamous cell - right face     There were no vitals filed for this visit.  Subjective Assessment - 05/12/20 1625    Subjective  COVID-19 screen performed prior to patient entering clinic.  Shoulder hurts.    Pertinent History  HTN, heart murmur, IBS, neuropathy, CAD, osteoporosis, memory loss    Limitations  House hold activities;Lifting    Diagnostic tests  MRI: RTC tendonopathy/tendonisis, articular surface tearing of supraspinatus tendon, AC joint OA, subacromical/subdelotid bursitis, see imaging    Patient Stated Goals  decrease pain, improve arm mobility    Currently in Pain?  Yes    Pain Score  6     Pain Location  Shoulder    Pain Orientation  Right    Pain Descriptors / Indicators  Aching;Sore    Pain Type  Acute pain    Pain Onset  1 to 4 weeks ago                        [facility] Adult PT Treatment/Exercise - 05/12/20 0001  Exercises   Exercises  Shoulder      Shoulder Exercises: Pulleys   Flexion  5 minutes      Modalities   Modalities  Electrical Stimulation;Ultrasound;Vasopneumatic      Electrical Stimulation   Electrical Stimulation Location  RT shoulder.    Electrical Stimulation Action  IFC    Electrical Stimulation Parameters  80-150 Hz x 15 minutes.    Electrical Stimulation Goals  Pain      Ultrasound   Ultrasound Location  Right shoulder.    Ultrasound Parameters  Combo e'stim/U/S at 1.50 W/CM2 x 10 minutes.    Ultrasound Goals  Pain      Vasopneumatic   Number Minutes Vasopneumatic   15 minutes    Vasopnuematic Location   --   Right shoulder.   Vasopneumatic Pressure  Low      Manual Therapy   Manual Therapy  Soft tissue mobilization    Manual therapy comments  STW/M x 8 minutes to patient's affected right shoulder region.               PT Short Term Goals - 11/24/19 1312      PT SHORT TERM GOAL #1   Title  STG=LTG        PT Long Term Goals - 05/09/20  2008      PT LONG TERM GOAL #1   Title  Patient will be independent with HEP    Time  6    Period  Weeks    Status  New      PT LONG TERM GOAL #2   Title  Patinet will demonstrate 130+ degrees of right shoulder flexion AROM to improve ability to perform overhead tasks and dressing activities.    Time  6    Period  Weeks    Status  New      PT LONG TERM GOAL #3   Title  Patient will improver right shoulder MMT to 4+/5 or greater to improve stability during functional tasks.    Time  6    Period  Weeks    Status  New      PT LONG TERM GOAL #4   Title  Patient will report ability to perform ADLs, home tasks, and gardening with right shoulder pain less than or equal to 3/10    Baseline  --    Time  6    Period  Days    Status  New            Plan - 05/12/20 1629    Clinical Impression Statement  Patient very to palpation over right posterior cuff musculature today.  She felt better after treatment.  Provided patient with a consent form for dry needling.    Personal Factors and Comorbidities  Time since onset of injury/illness/exacerbation;Comorbidity 3+;Age;Transportation    Comorbidities  HTN, heart murmur, IBS, neuropathy, CAD, osteoporosis, memory loss    Examination-Activity Limitations  Dressing;Hygiene/Grooming;Carry;Lift;Reach Overhead;Transfers    Stability/Clinical Decision Making  Stable/Uncomplicated    Rehab Potential  Good    PT Frequency  2x / week    PT Duration  6 weeks    PT Treatment/Interventions  ADLs/Self Care Home Management;Biofeedback;Cryotherapy;Software engineer;Therapeutic activities;Therapeutic exercise;Balance training;Neuromuscular re-education;Patient/family education;Manual techniques;Passive range of motion;Dry needling;Taping;Ultrasound;Iontophoresis 4mg /ml Dexamethasone    PT Next Visit Plan  pulleys, seated ranger for R shoulder, US/Combo as needed for pain relief, modalities as needed.    PT Home Exercise Plan   see patient education  Consulted and Agree with Plan of Care  Patient       Patient will benefit from skilled therapeutic intervention in order to improve the following deficits and impairments:  Pain, Decreased strength, Decreased range of motion, Decreased balance, Impaired UE functional use, Decreased safety awareness, Postural dysfunction  Visit Diagnosis: Acute pain of right shoulder  Stiffness of right shoulder, not elsewhere classified     Problem List Patient Active Problem List   Diagnosis Date Noted  . Gait abnormality 03/10/2020  . Chest pain 02/17/2020  . Gastroesophageal reflux disease 10/19/2019  . Hypertensive disorder 10/19/2019  . Third degree uterine prolapse 10/19/2019  . Spinal stenosis of lumbar region without neurogenic claudication 09/15/2019  . Urinary and fecal incontinence 09/15/2019  . Cystocele with rectocele 09/15/2019  . Radial styloid tenosynovitis (de quervain) 06/24/2019  . Trigger finger, right middle finger 06/24/2019  . Pain in left wrist 05/27/2019  . Trigger thumb, right thumb 10/29/2018  . Memory loss 07/22/2018  . Low back pain 07/22/2018  . Left lumbar radiculopathy 01/22/2018  . Mild cognitive impairment 10/22/2017  . Anxiety 10/22/2017  . Aortic atherosclerosis ([place]) 12/04/2016  . Ptosis of right eyelid 02/01/2016  . Dermatochalasis of both upper eyelids 03/07/2015  . Vitamin D deficiency 08/19/2014  . 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 Family history of colon cancer-father at 46+ grandparent w/ rectal cancer 01/14/2014  . MGD (meibomian gland dysfunction) 10/14/2013  . Injection of surface of eye 10/14/2013  . Osteopenia 08/12/2013  . CAD (coronary artery disease), native coronary artery 03/22/2013  . Aortic valve stenosis, mild 03/22/2013  . Hyperlipemia 03/11/2013  . Hypertension 03/11/2013  . Allergic rhinitis 03/11/2013  . Pain in right shoulder 03/11/2013  . IBS (irritable bowel  syndrome) - with chronic recurrent abdominal pain 02/01/2012  . Personal history of colonic polyps - adenoma 02/01/2012    Ludy Messamore, [place] MPT 05/12/2020, 4:39 PM  [facility] [address] [place], [place], [address] Phone: [phone]   Fax:  [phone]  Name: Fedora Gamache MRN: [id number] Date of Birth: 04-01-1951

## 2020-05-12 NOTE — Telephone Encounter (Signed)
Pt came in to office to see if nurse could look at her mouth and see if Dr Dettinger could prescribe her some mouthwash. Explained to pt that if she hasnt had a visit for the issue, then she will need to do that before anything can be prescribed to her per new [facility] policy. Offered pt a televisit/video visit with Dr Dettinger today. Pt declined because she said she had to be at the funeral home and would be back until after 7PM. Scheduled pt for televisit with [facility] tomorrow.

## 2020-05-12 NOTE — Telephone Encounter (Signed)
Since patient received first Covid vaccine a few days ago she has been having problems with her gums and mouth.  She said it feels like she has raw, burning spots in her mouth.  She has been rinsing with salt water but it has not helped.  Would like to know if you can send in [place] to [facility].

## 2020-05-13 ENCOUNTER — Encounter: Payer: Medicare PPO | Admitting: Family Medicine

## 2020-05-13 MED ORDER — MAGIC MOUTHWASH W/LIDOCAINE: 5.0000 mL | Freq: Three times a day (TID) | ORAL | 0 refills | Status: DC | PRN

## 2020-05-13 NOTE — Telephone Encounter (Signed)
Placed order for Magic mouthwash, it will have to be phoned in to the pharmacy.

## 2020-05-13 NOTE — Telephone Encounter (Signed)
Patient aware, script is ready, per message left on voice mail.

## 2020-05-13 NOTE — Progress Notes (Signed)
Patient did not answer.  It does not appear her appointment is needed any longer as Magic mouthwash was prescribed by PCP.

## 2020-05-17 ENCOUNTER — Encounter: Payer: Self-pay | Admitting: Physical Therapy

## 2020-05-17 ENCOUNTER — Ambulatory Visit: Payer: Medicare PPO | Attending: Orthopaedic Surgery | Admitting: Physical Therapy

## 2020-05-17 ENCOUNTER — Other Ambulatory Visit: Payer: Self-pay

## 2020-05-17 DIAGNOSIS — M25511 Pain in right shoulder: Secondary | ICD-10-CM | POA: Diagnosis not present

## 2020-05-17 DIAGNOSIS — M6281 Muscle weakness (generalized): Secondary | ICD-10-CM | POA: Insufficient documentation

## 2020-05-17 DIAGNOSIS — M25611 Stiffness of right shoulder, not elsewhere classified: Secondary | ICD-10-CM | POA: Insufficient documentation

## 2020-05-17 NOTE — Therapy (Signed)
[facility] [place], [place], [address] Phone: [phone]   Fax:  [phone]  Physical Therapy Treatment  Patient Details  Name: Shelly Bennett MRN: [id number] Date of Birth: 10/30/1951 Referring Provider (PT): Joni Fears, MD   Encounter Date: 05/17/2020  PT End of Session - 05/17/20 1342    Visit Number  3    Number of Visits  12    Date for PT Re-Evaluation  06/27/20    Authorization Type  progress note every 10th visit, KX modifier at 15th visit    Authorization Time Period  (6 visits used from previous episode of care; in 2021 calendar year)    PT Start Time  1300    PT Stop Time  1354    PT Time Calculation (min)  54 min    Activity Tolerance  Patient limited by pain    Behavior During Therapy  [facility] for tasks assessed/performed       Past Medical History:  Diagnosis Date  . Adenomatous colon polyp   . Allergic drug reaction 06/25/2015  . Allergy   . Anxiety   . Arthritis   . At risk for falls    fallen 4 times recently   . CAD (coronary artery disease)    Dr. Burt Knack follows   . Carpal tunnel syndrome   . Chronic headaches   . Coccydynia 02/01/2012  . Cystocele   . Diverticulosis   . External hemorrhoids   . Focal nodular hyperplasia of liver   . Gait abnormality   . GERD (gastroesophageal reflux disease)    some  . HCAP (healthcare-associated pneumonia) 06/25/2015  . Heart murmur   . HLD (hyperlipidemia)   . HTN (hypertension)   . IBS (irritable bowel syndrome)   . Memory loss   . Neuromuscular disorder ([place])    some neuropathy issues in the past- legs fibromyalgia in past-- past hx of steroid injections  . Obesity   . Osteoporosis   . Otitis of left ear 02/01/2017  . Pneumonia   . Skin cancer    squamous cell - face    Past Surgical History:  Procedure Laterality Date  . CARPAL TUNNEL RELEASE    . COLONOSCOPY  12/15/2008   diverticulosis, external hemorrhoids  . KNEE SURGERY   05/22/2015   right  . LAPAROSCOPY    . SKIN SURGERY     squamous cell - right face     There were no vitals filed for this visit.  Subjective Assessment - 05/17/20 1341    Subjective  COVID-19 screen performed prior to patient entering clinic.  Patient arrives with more R shoulder pain as she was working in her garden yesterday.    Pertinent History  HTN, heart murmur, IBS, neuropathy, CAD, osteoporosis, memory loss    Limitations  House hold activities;Lifting    Diagnostic tests  MRI: RTC tendonopathy/tendonisis, articular surface tearing of supraspinatus tendon, AC joint OA, subacromical/subdelotid bursitis, see imaging    Patient Stated Goals  decrease pain, improve arm mobility    Currently in Pain?  Yes    Pain Score  6     Pain Location  Shoulder    Pain Orientation  Right    Pain Descriptors / Indicators  Aching;Sore    Pain Type  Acute pain    Pain Onset  1 to 4 weeks ago    Pain Frequency  Constant         [facility] PT Assessment - 05/17/20 0001  Assessment   Medical Diagnosis  Chronic right shoulder pain    Referring Provider (PT)  Joni Fears, MD    Next MD Visit  June 2021    Prior Therapy  not for shoulder      Precautions   Precautions  Fall                    [facility] Adult PT Treatment/Exercise - 05/17/20 0001      Shoulder Exercises: Pulleys   Flexion  5 minutes      Modalities   Modalities  Electrical Stimulation;Ultrasound;Vasopneumatic      Electrical Stimulation   Electrical Stimulation Location  RT posterior shoulder.    Electrical Stimulation Action  pre-mod    Electrical Stimulation Parameters  80-150 hz x15 mins    Electrical Stimulation Goals  Pain      Ultrasound   Ultrasound Location  right posterior shoulder    Ultrasound Parameters  combo e-stim/US 100% 1.5 w/cm 1 mhz x10 mins    Ultrasound Goals  Pain      Vasopneumatic   Number Minutes Vasopneumatic   15 minutes    Vasopnuematic Location   Shoulder     Vasopneumatic Pressure  Low      Manual Therapy   Manual Therapy  Soft tissue mobilization    Manual therapy comments  STW/M to right posterior cuff region                  PT Long Term Goals - 05/09/20 2008      PT LONG TERM GOAL #1   Title  Patient will be independent with HEP    Time  6    Period  Weeks    Status  New      PT LONG TERM GOAL #2   Title  Patinet will demonstrate 130+ degrees of right shoulder flexion AROM to improve ability to perform overhead tasks and dressing activities.    Time  6    Period  Weeks    Status  New      PT LONG TERM GOAL #3   Title  Patient will improver right shoulder MMT to 4+/5 or greater to improve stability during functional tasks.    Time  6    Period  Weeks    Status  New      PT LONG TERM GOAL #4   Title  Patient will report ability to perform ADLs, home tasks, and gardening with right shoulder pain less than or equal to 3/10    Baseline  --    Time  6    Period  Days    Status  New            Plan - 05/17/20 1342    Clinical Impression Statement  Patient responded well to therapy session with the addition of the pulleys. Patient very tender to right shoulder posterior cuff during STW/M therefore gentle pressure applied. No adverse affects upon removal of modalities. Paitnet reported a decrease of pain at end of session.    Personal Factors and Comorbidities  Time since onset of injury/illness/exacerbation;Comorbidity 3+;Age;Transportation    Comorbidities  HTN, heart murmur, IBS, neuropathy, CAD, osteoporosis, memory loss    Examination-Activity Limitations  Dressing;Hygiene/Grooming;Carry;Lift;Reach Overhead;Transfers    Examination-Participation Restrictions  Meal Prep;Laundry;Yard Work    Stability/Clinical Decision Making  Stable/Uncomplicated    Clinical Decision Making  Low    Rehab Potential  Good    PT Frequency  2x /  week    PT Duration  6 weeks    PT Treatment/Interventions  ADLs/Self Care Home  Management;Biofeedback;Cryotherapy;Software engineer;Therapeutic activities;Therapeutic exercise;Balance training;Neuromuscular re-education;Patient/family education;Manual techniques;Passive range of motion;Dry needling;Taping;Ultrasound;Iontophoresis 4mg /ml Dexamethasone    PT Next Visit Plan  pulleys, seated ranger for R shoulder, US/Combo as needed for pain relief, modalities as needed.    PT Home Exercise Plan  see patient education    Consulted and Agree with Plan of Care  Patient       Patient will benefit from skilled therapeutic intervention in order to improve the following deficits and impairments:  Pain, Decreased strength, Decreased range of motion, Decreased balance, Impaired UE functional use, Decreased safety awareness, Postural dysfunction  Visit Diagnosis: Stiffness of right shoulder, not elsewhere classified  Acute pain of right shoulder  Muscle weakness (generalized)     Problem List Patient Active Problem List   Diagnosis Date Noted  . Gait abnormality 03/10/2020  . Chest pain 02/17/2020  . Gastroesophageal reflux disease 10/19/2019  . Hypertensive disorder 10/19/2019  . Third degree uterine prolapse 10/19/2019  . Spinal stenosis of lumbar region without neurogenic claudication 09/15/2019  . Urinary and fecal incontinence 09/15/2019  . Cystocele with rectocele 09/15/2019  . Radial styloid tenosynovitis (de quervain) 06/24/2019  . Trigger finger, right middle finger 06/24/2019  . Pain in left wrist 05/27/2019  . Trigger thumb, right thumb 10/29/2018  . Memory loss 07/22/2018  . Low back pain 07/22/2018  . Left lumbar radiculopathy 01/22/2018  . Mild cognitive impairment 10/22/2017  . Anxiety 10/22/2017  . Aortic atherosclerosis ([place]) 12/04/2016  . Ptosis of right eyelid 02/01/2016  . Dermatochalasis of both upper eyelids 03/07/2015  . Vitamin D deficiency 08/19/2014  . 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 Family history of colon cancer-father at 6+ grandparent w/ rectal cancer 01/14/2014  . MGD (meibomian gland dysfunction) 10/14/2013  . Injection of surface of eye 10/14/2013  . Osteopenia 08/12/2013  . CAD (coronary artery disease), native coronary artery 03/22/2013  . Aortic valve stenosis, mild 03/22/2013  . Hyperlipemia 03/11/2013  . Hypertension 03/11/2013  . Allergic rhinitis 03/11/2013  . Pain in right shoulder 03/11/2013  . IBS (irritable bowel syndrome) - with chronic recurrent abdominal pain 02/01/2012  . Personal history of colonic polyps - adenoma 02/01/2012    Gabriela Eves, PT, DPT 05/17/2020, 6:11 PM  [facility] [address] [place], [place], [address] Phone: [phone]   Fax:  [phone]  Name: Shelly Bennett MRN: [id number] Date of Birth: 08-17-1951

## 2020-05-19 ENCOUNTER — Other Ambulatory Visit: Payer: Self-pay | Admitting: Family Medicine

## 2020-05-19 ENCOUNTER — Ambulatory Visit: Payer: Medicare PPO | Admitting: Physical Therapy

## 2020-05-19 ENCOUNTER — Other Ambulatory Visit: Payer: Self-pay

## 2020-05-19 DIAGNOSIS — M25511 Pain in right shoulder: Secondary | ICD-10-CM | POA: Diagnosis not present

## 2020-05-19 DIAGNOSIS — M6281 Muscle weakness (generalized): Secondary | ICD-10-CM | POA: Diagnosis not present

## 2020-05-19 DIAGNOSIS — M25611 Stiffness of right shoulder, not elsewhere classified: Secondary | ICD-10-CM | POA: Diagnosis not present

## 2020-05-19 NOTE — Therapy (Signed)
[facility] [place], [place], [address] Phone: [phone]   Fax:  [phone]  Physical Therapy Treatment  Patient Details  Name: Shelly Bennett MRN: [id number] Date of Birth: 13-Aug-1951 Referring Provider (PT): Joni Fears, MD   Encounter Date: 05/19/2020  PT End of Session - 05/19/20 1351    Visit Number  4    Number of Visits  12    Date for PT Re-Evaluation  06/27/20    Authorization Type  progress note every 10th visit, KX modifier at 15th visit    PT Start Time  0100    PT Stop Time  0154    PT Time Calculation (min)  54 min    Activity Tolerance  Patient limited by pain    Behavior During Therapy  [facility] for tasks assessed/performed       Past Medical History:  Diagnosis Date  . Adenomatous colon polyp   . Allergic drug reaction 06/25/2015  . Allergy   . Anxiety   . Arthritis   . At risk for falls    fallen 4 times recently   . CAD (coronary artery disease)    Dr. Burt Knack follows   . Carpal tunnel syndrome   . Chronic headaches   . Coccydynia 02/01/2012  . Cystocele   . Diverticulosis   . External hemorrhoids   . Focal nodular hyperplasia of liver   . Gait abnormality   . GERD (gastroesophageal reflux disease)    some  . HCAP (healthcare-associated pneumonia) 06/25/2015  . Heart murmur   . HLD (hyperlipidemia)   . HTN (hypertension)   . IBS (irritable bowel syndrome)   . Memory loss   . Neuromuscular disorder ([place])    some neuropathy issues in the past- legs fibromyalgia in past-- past hx of steroid injections  . Obesity   . Osteoporosis   . Otitis of left ear 02/01/2017  . Pneumonia   . Skin cancer    squamous cell - face    Past Surgical History:  Procedure Laterality Date  . CARPAL TUNNEL RELEASE    . COLONOSCOPY  12/15/2008   diverticulosis, external hemorrhoids  . KNEE SURGERY  05/22/2015   right  . LAPAROSCOPY    . SKIN SURGERY     squamous cell - right face     There  were no vitals filed for this visit.  Subjective Assessment - 05/19/20 1349    Subjective  COVID-19 screen performed prior to patient entering clinic.  These treatments are helping.    Pertinent History  HTN, heart murmur, IBS, neuropathy, CAD, osteoporosis, memory loss    Limitations  House hold activities;Lifting    Diagnostic tests  MRI: RTC tendonopathy/tendonisis, articular surface tearing of supraspinatus tendon, AC joint OA, subacromical/subdelotid bursitis, see imaging    Patient Stated Goals  decrease pain, improve arm mobility    Currently in Pain?  Yes    Pain Score  6     Pain Location  Shoulder    Pain Orientation  Right    Pain Descriptors / Indicators  Aching;Sore    Pain Type  Acute pain    Pain Onset  1 to 4 weeks ago                        [facility] Adult PT Treatment/Exercise - 05/19/20 0001      Modalities   Modalities  Electrical Stimulation;Ultrasound      Electrical Stimulation  Electrical Stimulation Location  RT post shoulder.    Electrical Stimulation Action  Pre-mod.    Electrical Stimulation Parameters  80-150 Hz x 20 minutes.    Electrical Stimulation Goals  Pain      Ultrasound   Ultrasound Location  Rt post cuff.    Ultrasound Parameters  Combo e'stim/U/S at 1.50 W/CM2 x 12 minutes.    Ultrasound Goals  Pain      Manual Therapy   Manual Therapy  Soft tissue mobilization    Manual therapy comments  STW/M x 12 minutes to reduce tone.                  PT Long Term Goals - 05/09/20 2008      PT LONG TERM GOAL #1   Title  Patient will be independent with HEP    Time  6    Period  Weeks    Status  New      PT LONG TERM GOAL #2   Title  Patinet will demonstrate 130+ degrees of right shoulder flexion AROM to improve ability to perform overhead tasks and dressing activities.    Time  6    Period  Weeks    Status  New      PT LONG TERM GOAL #3   Title  Patient will improver right shoulder MMT to 4+/5 or greater to  improve stability during functional tasks.    Time  6    Period  Weeks    Status  New      PT LONG TERM GOAL #4   Title  Patient will report ability to perform ADLs, home tasks, and gardening with right shoulder pain less than or equal to 3/10    Baseline  --    Time  6    Period  Days    Status  New            Plan - 05/19/20 1354    Clinical Impression Statement  Patient remains tender in her posteripor cuff musculature but feels better overall.    Personal Factors and Comorbidities  Time since onset of injury/illness/exacerbation;Comorbidity 3+;Age;Transportation    Comorbidities  HTN, heart murmur, IBS, neuropathy, CAD, osteoporosis, memory loss       Patient will benefit from skilled therapeutic intervention in order to improve the following deficits and impairments:     Visit Diagnosis: Stiffness of right shoulder, not elsewhere classified  Acute pain of right shoulder     Problem List Patient Active Problem List   Diagnosis Date Noted  . Gait abnormality 03/10/2020  . Chest pain 02/17/2020  . Gastroesophageal reflux disease 10/19/2019  . Hypertensive disorder 10/19/2019  . Third degree uterine prolapse 10/19/2019  . Spinal stenosis of lumbar region without neurogenic claudication 09/15/2019  . Urinary and fecal incontinence 09/15/2019  . Cystocele with rectocele 09/15/2019  . Radial styloid tenosynovitis (de quervain) 06/24/2019  . Trigger finger, right middle finger 06/24/2019  . Pain in left wrist 05/27/2019  . Trigger thumb, right thumb 10/29/2018  . Memory loss 07/22/2018  . Low back pain 07/22/2018  . Left lumbar radiculopathy 01/22/2018  . Mild cognitive impairment 10/22/2017  . Anxiety 10/22/2017  . Aortic atherosclerosis ([place]) 12/04/2016  . Ptosis of right eyelid 02/01/2016  . Dermatochalasis of both upper eyelids 03/07/2015  . Vitamin D deficiency 08/19/2014  . 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 Family  history of colon cancer-father at 43+ grandparent w/ rectal cancer 01/14/2014  .  MGD (meibomian gland dysfunction) 10/14/2013  . Injection of surface of eye 10/14/2013  . Osteopenia 08/12/2013  . CAD (coronary artery disease), native coronary artery 03/22/2013  . Aortic valve stenosis, mild 03/22/2013  . Hyperlipemia 03/11/2013  . Hypertension 03/11/2013  . Allergic rhinitis 03/11/2013  . Pain in right shoulder 03/11/2013  . IBS (irritable bowel syndrome) - with chronic recurrent abdominal pain 02/01/2012  . Personal history of colonic polyps - adenoma 02/01/2012    Tiarna Koppen, [place] MPT 05/19/2020, 2:05 PM  [facility] [address] [place], [place], [address] Phone: [phone]   Fax:  [phone]  Name: Emil Saint MRN: [id number] Date of Birth: 27-May-1951

## 2020-05-23 ENCOUNTER — Ambulatory Visit: Payer: Medicare PPO | Admitting: Physical Therapy

## 2020-05-23 ENCOUNTER — Encounter: Payer: Self-pay | Admitting: Physical Therapy

## 2020-05-23 ENCOUNTER — Other Ambulatory Visit: Payer: Self-pay

## 2020-05-23 DIAGNOSIS — M25611 Stiffness of right shoulder, not elsewhere classified: Secondary | ICD-10-CM

## 2020-05-23 DIAGNOSIS — M25511 Pain in right shoulder: Secondary | ICD-10-CM | POA: Diagnosis not present

## 2020-05-23 DIAGNOSIS — M6281 Muscle weakness (generalized): Secondary | ICD-10-CM | POA: Diagnosis not present

## 2020-05-23 NOTE — Patient Instructions (Signed)
Strengthening: Isometric Flexion   Using wall for resistance, press right fist into ball using light pressure. Hold __5__ seconds. Repeat __10__ times per set. Do __2__ sets per session. Do _2___ sessions per day.   Strengthening: Isometric Extension   Using wall for resistance, press back of right arm into ball using light pressure. Hold _5___ seconds. Repeat __10__ times per set. Do __2__ sets per session. Do __2__ sessions per day.   Strengthening: Isometric External Rotation   Using wall to provide resistance, and keeping right arm at side, press back of hand into ball using light pressure. Hold __5__ seconds. Repeat __10__ times per set. Do _2___ sets per session. Do __2__ sessions per day.   Strengthening: Isometric Internal Rotation   Using door frame for resistance, press palm of right hand into ball using light pressure. Keep elbow in at side. Hold _5___ seconds. Repeat __10__ times per set. Do _2___ sets per session. Do __2__ sessions per day.    

## 2020-05-23 NOTE — Therapy (Signed)
[facility] [place], [place], [address] Phone: [phone]   Fax:  [phone]  Physical Therapy Treatment  Patient Details  Name: Shelly Bennett MRN: [id number] Date of Birth: August 13, 1951 Referring Provider (PT): Joni Fears, MD   Encounter Date: 05/23/2020  PT End of Session - 05/23/20 1417    Visit Number  5    Number of Visits  12    Date for PT Re-Evaluation  06/27/20    Authorization Type  progress note every 10th visit, KX modifier at 15th visit    Authorization Time Period  (6 visits used from previous episode of care; in 2021 calendar year)    PT Start Time  0145    PT Stop Time  0228    PT Time Calculation (min)  43 min    Activity Tolerance  Patient limited by pain;Patient tolerated treatment well    Behavior During Therapy  [facility] for tasks assessed/performed       Past Medical History:  Diagnosis Date  . Adenomatous colon polyp   . Allergic drug reaction 06/25/2015  . Allergy   . Anxiety   . Arthritis   . At risk for falls    fallen 4 times recently   . CAD (coronary artery disease)    Dr. Burt Knack follows   . Carpal tunnel syndrome   . Chronic headaches   . Coccydynia 02/01/2012  . Cystocele   . Diverticulosis   . External hemorrhoids   . Focal nodular hyperplasia of liver   . Gait abnormality   . GERD (gastroesophageal reflux disease)    some  . HCAP (healthcare-associated pneumonia) 06/25/2015  . Heart murmur   . HLD (hyperlipidemia)   . HTN (hypertension)   . IBS (irritable bowel syndrome)   . Memory loss   . Neuromuscular disorder ([place])    some neuropathy issues in the past- legs fibromyalgia in past-- past hx of steroid injections  . Obesity   . Osteoporosis   . Otitis of left ear 02/01/2017  . Pneumonia   . Skin cancer    squamous cell - face    Past Surgical History:  Procedure Laterality Date  . CARPAL TUNNEL RELEASE    . COLONOSCOPY  12/15/2008   diverticulosis, external  hemorrhoids  . KNEE SURGERY  05/22/2015   right  . LAPAROSCOPY    . SKIN SURGERY     squamous cell - right face     There were no vitals filed for this visit.  Subjective Assessment - 05/23/20 1351    Subjective  COVID-19 screen performed prior to patient entering clinic.  Patient arrived with increased pain due to reaching and pulling.    Pertinent History  HTN, heart murmur, IBS, neuropathy, CAD, osteoporosis, memory loss    Limitations  House hold activities;Lifting    Diagnostic tests  MRI: RTC tendonopathy/tendonisis, articular surface tearing of supraspinatus tendon, AC joint OA, subacromical/subdelotid bursitis, see imaging    Patient Stated Goals  decrease pain, improve arm mobility    Currently in Pain?  Yes    Pain Score  6     Pain Location  Shoulder    Pain Orientation  Right    Pain Descriptors / Indicators  Sore    Pain Type  Acute pain    Pain Onset  1 to 4 weeks ago    Pain Frequency  Constant    Aggravating Factors   increased activity    Pain Relieving Factors  rest n meds                        [facility] Adult PT Treatment/Exercise - 05/23/20 0001      Electrical Stimulation   Electrical Stimulation Location  RT post shoulder.    Electrical Stimulation Action  premod    Electrical Stimulation Parameters  80-150hz  x min      Ultrasound   Ultrasound Location  Right shoulder    Ultrasound Parameters  combo US/Es @1 .5w/cm2/61min     Ultrasound Goals  Pain      Vasopneumatic   Vasopnuematic Location   Shoulder    Vasopneumatic Pressure  Low    Vasopneumatic Temperature   34      Manual Therapy   Manual Therapy  Soft tissue mobilization    Soft tissue mobilization  manual STW to right ant/post cuff to reduce pain and tone             PT Education - 05/23/20 1421    Education Details  HEP progression    Person(s) Educated  Patient    Methods  Explanation;Demonstration;Handout    Comprehension  Verbalized understanding;Returned  demonstration          PT Long Term Goals - 05/23/20 1420      PT LONG TERM GOAL #1   Title  Patient will be independent with HEP    Time  6    Period  Weeks    Status  On-going      PT LONG TERM GOAL #2   Title  Patinet will demonstrate 130+ degrees of right shoulder flexion AROM to improve ability to perform overhead tasks and dressing activities.    Time  6    Period  Weeks    Status  On-going      PT LONG TERM GOAL #3   Title  Patient will improver right shoulder MMT to 4+/5 or greater to improve stability during functional tasks.    Time  6    Period  Weeks    Status  On-going      PT LONG TERM GOAL #4   Title  Patient will report ability to perform ADLs, home tasks, and gardening with right shoulder pain less than or equal to 3/10    Time  6    Period  Days    Status  On-going            Plan - 05/23/20 1422    Clinical Impression Statement  Patient tolerated treatment fair due to ongoing pain in right shoulder. Patient arrived with soreness due to some activity with shoulder today. Patient had tightness in shoulder and tenderness with manual STW. Patient issued HEP for home progression. Goals ongoing due to pain, strength and ROM limitations.    Personal Factors and Comorbidities  Time since onset of injury/illness/exacerbation;Comorbidity 3+;Age;Transportation    Comorbidities  HTN, heart murmur, IBS, neuropathy, CAD, osteoporosis, memory loss    Examination-Activity Limitations  Dressing;Hygiene/Grooming;Carry;Lift;Reach Overhead;Transfers    Examination-Participation Restrictions  Meal Prep;Laundry;Yard Work    Stability/Clinical Decision Making  Stable/Uncomplicated    Rehab Potential  Good    PT Frequency  2x / week    PT Duration  6 weeks    PT Treatment/Interventions  ADLs/Self Care Home Management;Biofeedback;Cryotherapy;Software engineer;Therapeutic activities;Therapeutic exercise;Balance training;Neuromuscular  re-education;Patient/family education;Manual techniques;Passive range of motion;Dry needling;Taping;Ultrasound;Iontophoresis 4mg /ml Dexamethasone    PT Next Visit Plan  pulleys, seated ranger for R shoulder, US/Combo as  needed for pain relief, modalities as needed.    Consulted and Agree with Plan of Care  Patient       Patient will benefit from skilled therapeutic intervention in order to improve the following deficits and impairments:  Pain, Decreased strength, Decreased range of motion, Decreased balance, Impaired UE functional use, Decreased safety awareness, Postural dysfunction  Visit Diagnosis: Stiffness of right shoulder, not elsewhere classified  Acute pain of right shoulder  Muscle weakness (generalized)     Problem List Patient Active Problem List   Diagnosis Date Noted  . Gait abnormality 03/10/2020  . Chest pain 02/17/2020  . Gastroesophageal reflux disease 10/19/2019  . Hypertensive disorder 10/19/2019  . Third degree uterine prolapse 10/19/2019  . Spinal stenosis of lumbar region without neurogenic claudication 09/15/2019  . Urinary and fecal incontinence 09/15/2019  . Cystocele with rectocele 09/15/2019  . Radial styloid tenosynovitis (de quervain) 06/24/2019  . Trigger finger, right middle finger 06/24/2019  . Pain in left wrist 05/27/2019  . Trigger thumb, right thumb 10/29/2018  . Memory loss 07/22/2018  . Low back pain 07/22/2018  . Left lumbar radiculopathy 01/22/2018  . Mild cognitive impairment 10/22/2017  . Anxiety 10/22/2017  . Aortic atherosclerosis ([place]) 12/04/2016  . Ptosis of right eyelid 02/01/2016  . Dermatochalasis of both upper eyelids 03/07/2015  . Vitamin D deficiency 08/19/2014  . 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 Family history of colon cancer-father at 50+ grandparent w/ rectal cancer 01/14/2014  . MGD (meibomian gland dysfunction) 10/14/2013  . Injection of surface of eye 10/14/2013  . Osteopenia  08/12/2013  . CAD (coronary artery disease), native coronary artery 03/22/2013  . Aortic valve stenosis, mild 03/22/2013  . Hyperlipemia 03/11/2013  . Hypertension 03/11/2013  . Allergic rhinitis 03/11/2013  . Pain in right shoulder 03/11/2013  . IBS (irritable bowel syndrome) - with chronic recurrent abdominal pain 02/01/2012  . Personal history of colonic polyps - adenoma 02/01/2012    Phillips Climes, PTA 05/23/2020, 2:32 PM  [facility] [place], [place], [address] Phone: [phone]   Fax:  [phone]  Name: Shelly Bennett MRN: [id number] Date of Birth: Oct 22, 1951

## 2020-05-24 ENCOUNTER — Telehealth: Payer: Self-pay | Admitting: Family Medicine

## 2020-05-24 NOTE — Telephone Encounter (Signed)
Pt states that she is due for her 2nd covid vaccine. She states with the 1st dose she could not move her legs and had sores in her mouth. She is wanting to know if she should take the 2nd dose or no with the reaction she had. She had the moderna vaccine.

## 2020-05-25 ENCOUNTER — Other Ambulatory Visit: Payer: Self-pay | Admitting: Obstetrics and Gynecology

## 2020-05-25 ENCOUNTER — Telehealth: Payer: Self-pay

## 2020-05-25 DIAGNOSIS — R928 Other abnormal and inconclusive findings on diagnostic imaging of breast: Secondary | ICD-10-CM

## 2020-05-25 NOTE — Telephone Encounter (Signed)
Based on her symptoms, I agree that I would be very hesitant to get the second dose of the vaccine, we did discuss this in the appointment, I think if I were her I would hold off.  I am okay with her husband going ahead with his because he did not have this reaction

## 2020-05-25 NOTE — Telephone Encounter (Signed)
error 

## 2020-05-25 NOTE — Telephone Encounter (Signed)
Patient aware and verbalized understanding. °

## 2020-05-26 ENCOUNTER — Other Ambulatory Visit: Payer: Self-pay

## 2020-05-26 ENCOUNTER — Ambulatory Visit: Payer: Medicare PPO | Admitting: Physical Therapy

## 2020-05-26 DIAGNOSIS — M25611 Stiffness of right shoulder, not elsewhere classified: Secondary | ICD-10-CM | POA: Diagnosis not present

## 2020-05-26 DIAGNOSIS — M25511 Pain in right shoulder: Secondary | ICD-10-CM | POA: Diagnosis not present

## 2020-05-26 DIAGNOSIS — M6281 Muscle weakness (generalized): Secondary | ICD-10-CM | POA: Diagnosis not present

## 2020-05-26 NOTE — Therapy (Signed)
[facility] [place], [place], [address] Phone: [phone]   Fax:  [phone]  Physical Therapy Treatment  Patient Details  Name: Shelly Bennett MRN: [id number] Date of Birth: 1950/12/31 Referring Provider (PT): Joni Fears, MD   Encounter Date: 05/26/2020   PT End of Session - 05/26/20 1434    Visit Number 6    Number of Visits 12    Date for PT Re-Evaluation 06/27/20    Authorization Type progress note every 10th visit, KX modifier at 15th visit    Authorization Time Period (6 visits used from previous episode of care; in 2021 calendar year)    PT Start Time 0150    PT Stop Time 0242    PT Time Calculation (min) 52 min    Equipment Utilized During Treatment Other (comment)    Activity Tolerance Patient limited by pain;Patient tolerated treatment well    Behavior During Therapy [facility] for tasks assessed/performed           Past Medical History:  Diagnosis Date   Adenomatous colon polyp    Allergic drug reaction 06/25/2015   Allergy    Anxiety    Arthritis    At risk for falls    fallen 4 times recently    CAD (coronary artery disease)    Dr. Burt Knack follows    Carpal tunnel syndrome    Chronic headaches    Coccydynia 02/01/2012   Cystocele    Diverticulosis    External hemorrhoids    Focal nodular hyperplasia of liver    Gait abnormality    GERD (gastroesophageal reflux disease)    some   HCAP (healthcare-associated pneumonia) 06/25/2015   Heart murmur    HLD (hyperlipidemia)    HTN (hypertension)    IBS (irritable bowel syndrome)    Memory loss    Neuromuscular disorder ([place])    some neuropathy issues in the past- legs fibromyalgia in past-- past hx of steroid injections   Obesity    Osteoporosis    Otitis of left ear 02/01/2017   Pneumonia    Skin cancer    squamous cell - face    Past Surgical History:  Procedure Laterality Date   CARPAL TUNNEL RELEASE       COLONOSCOPY  12/15/2008   diverticulosis, external hemorrhoids   KNEE SURGERY  05/22/2015   right   LAPAROSCOPY     SKIN SURGERY     squamous cell - right face     There were no vitals filed for this visit.   Subjective Assessment - 05/26/20 1423    Subjective COVID-19 screen performed prior to patient entering clinic.  My shoulder feels better since starting treatment.    Pertinent History HTN, heart murmur, IBS, neuropathy, CAD, osteoporosis, memory loss    Limitations House hold activities;Lifting    Diagnostic tests MRI: RTC tendonopathy/tendonisis, articular surface tearing of supraspinatus tendon, AC joint OA, subacromical/subdelotid bursitis, see imaging    Patient Stated Goals decrease pain, improve arm mobility    Currently in Pain? Yes    Pain Score 5     Pain Location Shoulder    Pain Orientation Right    Pain Descriptors / Indicators Sore    Pain Type Acute pain    Pain Onset 1 to 4 weeks ago                             [facility] Adult PT Treatment/Exercise -  05/26/20 0001      Exercises   Exercises Shoulder      Shoulder Exercises: Pulleys   Flexion Limitations --   6 minutes.     Modalities   Modalities Electrical Stimulation;Ultrasound      Acupuncturist Location RT shoulder.    Electrical Stimulation Action IFC    Electrical Stimulation Parameters 80-150 Hz x 20 minutes.    Electrical Stimulation Goals Tone;Pain      Ultrasound   Ultrasound Location Right shoulder.    Ultrasound Parameters Combo e'stim/U/S at 1.50 W/CM2 x 7 minutes.    Ultrasound Goals Pain      Vasopneumatic   Number Minutes Vasopneumatic  20 minutes   RT shoulder.   Vasopnuematic Location  --   Right shoulder.   Vasopneumatic Pressure Low      Manual Therapy   Manual Therapy Soft tissue mobilization    Soft tissue mobilization IASTM x 5 minutes to patient's middle deltoid and posterior cuff musculature.nt's                         PT Long Term Goals - 05/23/20 1420      PT LONG TERM GOAL #1   Title Patient will be independent with HEP    Time 6    Period Weeks    Status On-going      PT LONG TERM GOAL #2   Title Patinet will demonstrate 130+ degrees of right shoulder flexion AROM to improve ability to perform overhead tasks and dressing activities.    Time 6    Period Weeks    Status On-going      PT LONG TERM GOAL #3   Title Patient will improver right shoulder MMT to 4+/5 or greater to improve stability during functional tasks.    Time 6    Period Weeks    Status On-going      PT LONG TERM GOAL #4   Title Patient will report ability to perform ADLs, home tasks, and gardening with right shoulder pain less than or equal to 3/10    Time 6    Period Days    Status On-going                 Plan - 05/26/20 1429    Clinical Impression Statement The patient is reporting improvement with regards to her right shoulder.  She reports some pain reduction and her range of motion has improved.  She is tender to palption over her right middle deltoid and posterior cuff musculature.    Personal Factors and Comorbidities Time since onset of injury/illness/exacerbation;Comorbidity 3+;Age;Transportation    Comorbidities HTN, heart murmur, IBS, neuropathy, CAD, osteoporosis, memory loss    Examination-Activity Limitations Dressing;Hygiene/Grooming;Carry;Lift;Reach Overhead;Transfers    Examination-Participation Restrictions Meal Prep;Laundry;Yard Work    Stability/Clinical Decision Making Stable/Uncomplicated    Rehab Potential Good    PT Frequency 2x / week    PT Treatment/Interventions ADLs/Self Care Home Management;Biofeedback;Cryotherapy;Software engineer;Therapeutic activities;Therapeutic exercise;Balance training;Neuromuscular re-education;Patient/family education;Manual techniques;Passive range of motion;Dry needling;Taping;Ultrasound;Iontophoresis  4mg /ml Dexamethasone    PT Next Visit Plan pulleys, seated ranger for R shoulder, US/Combo as needed for pain relief, modalities as needed.    PT Home Exercise Plan see patient education    Consulted and Agree with Plan of Care Patient           Patient will benefit from skilled therapeutic intervention in order to improve the following deficits and  impairments:  Pain, Decreased strength, Decreased range of motion, Decreased balance, Impaired UE functional use, Decreased safety awareness, Postural dysfunction  Visit Diagnosis: Stiffness of right shoulder, not elsewhere classified  Acute pain of right shoulder     Problem List Patient Active Problem List   Diagnosis Date Noted   Gait abnormality 03/10/2020   Chest pain 02/17/2020   Gastroesophageal reflux disease 10/19/2019   Hypertensive disorder 10/19/2019   Third degree uterine prolapse 10/19/2019   Spinal stenosis of lumbar region without neurogenic claudication 09/15/2019   Urinary and fecal incontinence 09/15/2019   Cystocele with rectocele 09/15/2019   Radial styloid tenosynovitis (de quervain) 06/24/2019   Trigger finger, right middle finger 06/24/2019   Pain in left wrist 05/27/2019   Trigger thumb, right thumb 10/29/2018   Memory loss 07/22/2018   Low back pain 07/22/2018   Left lumbar radiculopathy 01/22/2018   Mild cognitive impairment 10/22/2017   Anxiety 10/22/2017   Aortic atherosclerosis ([place]) 12/04/2016   Ptosis of right eyelid 02/01/2016   Dermatochalasis of both upper eyelids 03/07/2015   Vitamin D deficiency 08/19/2014   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 70/78/6754   Family history of colon cancer-father at 58+ grandparent w/ rectal cancer 01/14/2014   MGD (meibomian gland dysfunction) 10/14/2013   Injection of surface of eye 10/14/2013   Osteopenia 08/12/2013   CAD (coronary artery disease), native coronary artery 03/22/2013   Aortic valve  stenosis, mild 03/22/2013   Hyperlipemia 03/11/2013   Hypertension 03/11/2013   Allergic rhinitis 03/11/2013   Pain in right shoulder 03/11/2013   IBS (irritable bowel syndrome) - with chronic recurrent abdominal pain 02/01/2012   Personal history of colonic polyps - adenoma 02/01/2012    Zyshawn Bohnenkamp, [place] MPT 05/26/2020, 2:43 PM  [facility] [address] [place], [place], [address] Phone: [phone]   Fax:  [phone]  Name: Shelly Bennett MRN: [id number] Date of Birth: [birth year]

## 2020-05-30 ENCOUNTER — Other Ambulatory Visit: Payer: Self-pay

## 2020-05-30 ENCOUNTER — Ambulatory Visit: Payer: Medicare PPO

## 2020-05-30 DIAGNOSIS — M6281 Muscle weakness (generalized): Secondary | ICD-10-CM

## 2020-05-30 DIAGNOSIS — M25511 Pain in right shoulder: Secondary | ICD-10-CM

## 2020-05-30 DIAGNOSIS — M25611 Stiffness of right shoulder, not elsewhere classified: Secondary | ICD-10-CM | POA: Diagnosis not present

## 2020-05-30 NOTE — Therapy (Signed)
[facility] [place], [place], [address] Phone: [phone]   Fax:  [phone]  Physical Therapy Treatment  Patient Details  Name: Shelly Bennett MRN: [id number] Date of Birth: [birth year] Referring Provider (PT): Joni Fears, MD   Encounter Date: 05/30/2020   PT End of Session - 05/30/20 1444    Visit Number 7    Number of Visits 12    Date for PT Re-Evaluation 06/27/20    Authorization Type progress note every 10th visit, KX modifier at 15th visit    Authorization Time Period (6 visits used from previous episode of care; in 2021 calendar year)    PT Start Time 1346    PT Stop Time 1438    PT Time Calculation (min) 52 min    Equipment Utilized During Treatment Other (comment)   SPC   Activity Tolerance Patient tolerated treatment well;Patient limited by pain;No increased pain    Behavior During Therapy WFL for tasks assessed/performed           Past Medical History:  Diagnosis Date  . Adenomatous colon polyp   . Allergic drug reaction 06/25/2015  . Allergy   . Anxiety   . Arthritis   . At risk for falls    fallen 4 times recently   . CAD (coronary artery disease)    Dr. Burt Knack follows   . Carpal tunnel syndrome   . Chronic headaches   . Coccydynia 02/01/2012  . Cystocele   . Diverticulosis   . External hemorrhoids   . Focal nodular hyperplasia of liver   . Gait abnormality   . GERD (gastroesophageal reflux disease)    some  . HCAP (healthcare-associated pneumonia) 06/25/2015  . Heart murmur   . HLD (hyperlipidemia)   . HTN (hypertension)   . IBS (irritable bowel syndrome)   . Memory loss   . Neuromuscular disorder ([place])    some neuropathy issues in the past- legs fibromyalgia in past-- past hx of steroid injections  . Obesity   . Osteoporosis   . Otitis of left ear 02/01/2017  . Pneumonia   . Skin cancer    squamous cell - face    Past Surgical History:  Procedure Laterality Date  .  CARPAL TUNNEL RELEASE    . COLONOSCOPY  12/15/2008   diverticulosis, external hemorrhoids  . KNEE SURGERY  05/22/2015   right  . LAPAROSCOPY    . SKIN SURGERY     squamous cell - right face     There were no vitals filed for this visit.   Subjective Assessment - 05/30/20 1349    Subjective COVID-19 screen performed prior to patient entering clinic.  Pt stated her shoulder is tired today, has been watering plants earlier today and some pain/fatigue with task.  Reports most difficulty reaching bra strap with Rt arm.    Pertinent History HTN, heart murmur, IBS, neuropathy, CAD, osteoporosis, memory loss    Patient Stated Goals decrease pain, improve arm mobility    Currently in Pain? Yes    Pain Score 4     Pain Location Shoulder    Pain Orientation Right    Pain Descriptors / Indicators Sore;Aching    Pain Type Acute pain    Pain Onset 1 to 4 weeks ago    Pain Frequency Constant    Aggravating Factors  increased activity    Pain Relieving Factors rest and meds    Effect of Pain on Daily Activities difficult to perform certain  activiites, needs husband to assist                             [facility] Adult PT Treatment/Exercise - 05/30/20 0001      Exercises   Exercises Shoulder      Shoulder Exercises: Seated   Other Seated Exercises scapular retraction      Shoulder Exercises: Pulleys   Flexion 3 minutes    Scaption 3 minutes      Shoulder Exercises: ROM/Strengthening   Wall Wash towel sliding up wall for flex and abd 5x with stretch end range      Electrical Stimulation   Electrical Stimulation Location RT shoulder.    Electrical Stimulation Action IFC/US combo    Electrical Stimulation Parameters 80-150 Hzx 8 min    Electrical Stimulation Goals Tone;Pain      Ultrasound   Ultrasound Location Rt shoulder    Ultrasound Parameters combo estim/UE    Ultrasound Goals Pain      Manual Therapy   Manual Therapy Soft tissue mobilization    Manual therapy  comments Manual complete separate than rest of tx    Soft tissue mobilization STM to anterior/middle delt, pecs in seated position with Rt UE on pillow for comfort                       PT Long Term Goals - 05/23/20 1420      PT LONG TERM GOAL #1   Title Patient will be independent with HEP    Time 6    Period Weeks    Status On-going      PT LONG TERM GOAL #2   Title Patinet will demonstrate 130+ degrees of right shoulder flexion AROM to improve ability to perform overhead tasks and dressing activities.    Time 6    Period Weeks    Status On-going      PT LONG TERM GOAL #3   Title Patient will improver right shoulder MMT to 4+/5 or greater to improve stability during functional tasks.    Time 6    Period Weeks    Status On-going      PT LONG TERM GOAL #4   Title Patient will report ability to perform ADLs, home tasks, and gardening with right shoulder pain less than or equal to 3/10    Time 6    Period Days    Status On-going                 Plan - 05/30/20 1445    Clinical Impression Statement Progress shoulder mobility with additional wall washing with towel.  Improved AROM for flexion at 142 degree, abd 135, ER at T2 and IR to T12 in pain free range.  Pt most tender to palpation over Rt anterior and middle tight, tightness Rt pecs.  Reports of relief following manual STM and combo UE/estim.    Personal Factors and Comorbidities Time since onset of injury/illness/exacerbation;Comorbidity 3+;Age;Transportation    Comorbidities HTN, heart murmur, IBS, neuropathy, CAD, osteoporosis, memory loss    Examination-Activity Limitations Dressing;Hygiene/Grooming;Carry;Lift;Reach Overhead;Transfers    Examination-Participation Restrictions Meal Prep;Laundry;Yard Work    Stability/Clinical Decision Making Stable/Uncomplicated    Designer, jewellery Low    Rehab Potential Good    PT Frequency 2x / week    PT Duration 6 weeks    PT Treatment/Interventions  ADLs/Self Care Home Management;Biofeedback;Cryotherapy;Software engineer;Therapeutic activities;Therapeutic exercise;Balance training;Neuromuscular re-education;Patient/family  education;Manual techniques;Passive range of motion;Dry needling;Taping;Ultrasound;Iontophoresis 4mg /ml Dexamethasone    PT Next Visit Plan pulleys, seated ranger for R shoulder, US/Combo as needed for pain relief, modalities as needed.    PT Home Exercise Plan see patient education           Patient will benefit from skilled therapeutic intervention in order to improve the following deficits and impairments:  Pain, Decreased strength, Decreased range of motion, Decreased balance, Impaired UE functional use, Decreased safety awareness, Postural dysfunction  Visit Diagnosis: Acute pain of right shoulder  Stiffness of right shoulder, not elsewhere classified  Muscle weakness (generalized)     Problem List Patient Active Problem List   Diagnosis Date Noted  . Gait abnormality 03/10/2020  . Chest pain 02/17/2020  . Gastroesophageal reflux disease 10/19/2019  . Hypertensive disorder 10/19/2019  . Third degree uterine prolapse 10/19/2019  . Spinal stenosis of lumbar region without neurogenic claudication 09/15/2019  . Urinary and fecal incontinence 09/15/2019  . Cystocele with rectocele 09/15/2019  . Radial styloid tenosynovitis (de quervain) 06/24/2019  . Trigger finger, right middle finger 06/24/2019  . Pain in left wrist 05/27/2019  . Trigger thumb, right thumb 10/29/2018  . Memory loss 07/22/2018  . Low back pain 07/22/2018  . Left lumbar radiculopathy 01/22/2018  . Mild cognitive impairment 10/22/2017  . Anxiety 10/22/2017  . Aortic atherosclerosis ([place]) 12/04/2016  . Ptosis of right eyelid 02/01/2016  . Dermatochalasis of both upper eyelids 03/07/2015  . Vitamin D deficiency 08/19/2014  . 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 Family  history of colon cancer-father at 85+ grandparent w/ rectal cancer 01/14/2014  . MGD (meibomian gland dysfunction) 10/14/2013  . Injection of surface of eye 10/14/2013  . Osteopenia 08/12/2013  . CAD (coronary artery disease), native coronary artery 03/22/2013  . Aortic valve stenosis, mild 03/22/2013  . Hyperlipemia 03/11/2013  . Hypertension 03/11/2013  . Allergic rhinitis 03/11/2013  . Pain in right shoulder 03/11/2013  . IBS (irritable bowel syndrome) - with chronic recurrent abdominal pain 02/01/2012  . Personal history of colonic polyps - adenoma 02/01/2012   Ihor Austin, LPTA/CLT; CBIS [phone]  Aldona Lento 05/30/2020, 2:51 PM  [facility] [place], [place], [address] Phone: [phone]   Fax:  [phone]  Name: Shelly Bennett MRN: [id number] Date of Birth: 15-Jan-1951

## 2020-06-01 ENCOUNTER — Other Ambulatory Visit: Payer: Self-pay

## 2020-06-01 ENCOUNTER — Encounter: Payer: Self-pay | Admitting: Physical Therapy

## 2020-06-01 ENCOUNTER — Ambulatory Visit: Payer: Medicare PPO | Admitting: Physical Therapy

## 2020-06-01 DIAGNOSIS — M25511 Pain in right shoulder: Secondary | ICD-10-CM | POA: Diagnosis not present

## 2020-06-01 DIAGNOSIS — M25611 Stiffness of right shoulder, not elsewhere classified: Secondary | ICD-10-CM

## 2020-06-01 DIAGNOSIS — M6281 Muscle weakness (generalized): Secondary | ICD-10-CM | POA: Diagnosis not present

## 2020-06-01 NOTE — Therapy (Signed)
[facility] [place], [place], [address] Phone: [phone]   Fax:  [phone]  Physical Therapy Treatment  Patient Details  Name: Basia Mcginty MRN: [id number] Date of Birth: 1951-03-25 Referring Provider (PT): Joni Fears, MD   Encounter Date: 06/01/2020   PT End of Session - 06/01/20 1359    Visit Number 8    Number of Visits 12    Date for PT Re-Evaluation 06/27/20    Authorization Type progress note every 10th visit, KX modifier at 15th visit    PT Start Time 1356    PT Stop Time 1436    PT Time Calculation (min) 40 min    Equipment Utilized During Treatment Other (comment)   SPC   Activity Tolerance Patient tolerated treatment well    Behavior During Therapy [facility] for tasks assessed/performed           Past Medical History:  Diagnosis Date  . Adenomatous colon polyp   . Allergic drug reaction 06/25/2015  . Allergy   . Anxiety   . Arthritis   . At risk for falls    fallen 4 times recently   . CAD (coronary artery disease)    Dr. Burt Knack follows   . Carpal tunnel syndrome   . Chronic headaches   . Coccydynia 02/01/2012  . Cystocele   . Diverticulosis   . External hemorrhoids   . Focal nodular hyperplasia of liver   . Gait abnormality   . GERD (gastroesophageal reflux disease)    some  . HCAP (healthcare-associated pneumonia) 06/25/2015  . Heart murmur   . HLD (hyperlipidemia)   . HTN (hypertension)   . IBS (irritable bowel syndrome)   . Memory loss   . Neuromuscular disorder ([place])    some neuropathy issues in the past- legs fibromyalgia in past-- past hx of steroid injections  . Obesity   . Osteoporosis   . Otitis of left ear 02/01/2017  . Pneumonia   . Skin cancer    squamous cell - face    Past Surgical History:  Procedure Laterality Date  . CARPAL TUNNEL RELEASE    . COLONOSCOPY  12/15/2008   diverticulosis, external hemorrhoids  . KNEE SURGERY  05/22/2015   right  .  LAPAROSCOPY    . SKIN SURGERY     squamous cell - right face     There were no vitals filed for this visit.   Subjective Assessment - 06/01/20 1358    Subjective COVID 19 screening performed on patient upon arrival. Patient reports she has been doing some work around her house today but her shoulder feels a little better.    Pertinent History HTN, heart murmur, IBS, neuropathy, CAD, osteoporosis, memory loss    Limitations House hold activities;Lifting    Diagnostic tests MRI: RTC tendonopathy/tendonisis, articular surface tearing of supraspinatus tendon, AC joint OA, subacromical/subdelotid bursitis, see imaging    Patient Stated Goals decrease pain, improve arm mobility    Currently in Pain? Yes    Pain Score 4     Pain Location Shoulder    Pain Orientation Right    Pain Descriptors / Indicators Discomfort    Pain Type Acute pain    Pain Onset 1 to 4 weeks ago    Pain Frequency Constant              [facility] PT Assessment - 06/01/20 0001      Assessment   Medical Diagnosis Chronic right shoulder pain  Referring Provider (PT) Joni Fears, MD    Next MD Visit June 2021    Prior Therapy not for shoulder      Precautions   Precautions Fall                         [facility] Adult PT Treatment/Exercise - 06/01/20 0001      Shoulder Exercises: ROM/Strengthening   UBE (Upper Arm Bike) 120 RPM x6 min (forward)      Modalities   Modalities Electrical Stimulation;Ultrasound      Electrical Stimulation   Electrical Stimulation Location R shoulder    Electrical Stimulation Action IFC    Electrical Stimulation Parameters 80-150 hz x15 min    Electrical Stimulation Goals Pain      Ultrasound   Ultrasound Location R shoulder    Ultrasound Parameters Combo 1.5 w/cm2, 100%, 1 mhz x10 min    Ultrasound Goals Pain                       PT Long Term Goals - 05/23/20 1420      PT LONG TERM GOAL #1   Title Patient will be independent with HEP     Time 6    Period Weeks    Status On-going      PT LONG TERM GOAL #2   Title Patinet will demonstrate 130+ degrees of right shoulder flexion AROM to improve ability to perform overhead tasks and dressing activities.    Time 6    Period Weeks    Status On-going      PT LONG TERM GOAL #3   Title Patient will improver right shoulder MMT to 4+/5 or greater to improve stability during functional tasks.    Time 6    Period Weeks    Status On-going      PT LONG TERM GOAL #4   Title Patient will report ability to perform ADLs, home tasks, and gardening with right shoulder pain less than or equal to 3/10    Time 6    Period Days    Status On-going                 Plan - 06/01/20 1432    Clinical Impression Statement Patient presented in clinic with reports of more discomfort after doing activities around her home. Patient did report R wrist discomfort from carpal tunnel after being on UBE. Discomfort reported with horizonal abduction motion during CW wall wash. Normal modalities response noted following removal of the modalities.    Personal Factors and Comorbidities Time since onset of injury/illness/exacerbation;Comorbidity 3+;Age;Transportation    Comorbidities HTN, heart murmur, IBS, neuropathy, CAD, osteoporosis, memory loss    Examination-Activity Limitations Dressing;Hygiene/Grooming;Carry;Lift;Reach Overhead;Transfers    Examination-Participation Restrictions Meal Prep;Laundry;Yard Work    Stability/Clinical Decision Making Stable/Uncomplicated    Rehab Potential Good    PT Frequency 2x / week    PT Duration 6 weeks    PT Treatment/Interventions ADLs/Self Care Home Management;Biofeedback;Cryotherapy;Software engineer;Therapeutic activities;Therapeutic exercise;Balance training;Neuromuscular re-education;Patient/family education;Manual techniques;Passive range of motion;Dry needling;Taping;Ultrasound;Iontophoresis 4mg /ml Dexamethasone    PT Next  Visit Plan pulleys, seated ranger for R shoulder, US/Combo as needed for pain relief, modalities as needed.    PT Home Exercise Plan see patient education    Consulted and Agree with Plan of Care Patient           Patient will benefit from skilled therapeutic intervention in order to improve the following  deficits and impairments:  Pain, Decreased strength, Decreased range of motion, Decreased balance, Impaired UE functional use, Decreased safety awareness, Postural dysfunction  Visit Diagnosis: Acute pain of right shoulder  Stiffness of right shoulder, not elsewhere classified     Problem List Patient Active Problem List   Diagnosis Date Noted  . Gait abnormality 03/10/2020  . Chest pain 02/17/2020  . Gastroesophageal reflux disease 10/19/2019  . Hypertensive disorder 10/19/2019  . Third degree uterine prolapse 10/19/2019  . Spinal stenosis of lumbar region without neurogenic claudication 09/15/2019  . Urinary and fecal incontinence 09/15/2019  . Cystocele with rectocele 09/15/2019  . Radial styloid tenosynovitis (de quervain) 06/24/2019  . Trigger finger, right middle finger 06/24/2019  . Pain in left wrist 05/27/2019  . Trigger thumb, right thumb 10/29/2018  . Memory loss 07/22/2018  . Low back pain 07/22/2018  . Left lumbar radiculopathy 01/22/2018  . Mild cognitive impairment 10/22/2017  . Anxiety 10/22/2017  . Aortic atherosclerosis ([place]) 12/04/2016  . Ptosis of right eyelid 02/01/2016  . Dermatochalasis of both upper eyelids 03/07/2015  . Vitamin D deficiency 08/19/2014  . 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 Family history of colon cancer-father at 2+ grandparent w/ rectal cancer 01/14/2014  . MGD (meibomian gland dysfunction) 10/14/2013  . Injection of surface of eye 10/14/2013  . Osteopenia 08/12/2013  . CAD (coronary artery disease), native coronary artery 03/22/2013  . Aortic valve stenosis, mild 03/22/2013  .  Hyperlipemia 03/11/2013  . Hypertension 03/11/2013  . Allergic rhinitis 03/11/2013  . Pain in right shoulder 03/11/2013  . IBS (irritable bowel syndrome) - with chronic recurrent abdominal pain 02/01/2012  . Personal history of colonic polyps - adenoma 02/01/2012    Standley Brooking, PTA 06/01/2020, 3:01 PM  [facility] [address] [place], [place], [address] Phone: [phone]   Fax:  [phone]  Name: Felisia Balcom MRN: [id number] Date of Birth: 1951-11-19

## 2020-06-03 ENCOUNTER — Ambulatory Visit
Admission: RE | Admit: 2020-06-03 | Discharge: 2020-06-03 | Disposition: A | Discharge status: Home / Self Care | Payer: Medicare PPO | Source: Ambulatory Visit | Attending: Obstetrics and Gynecology | Admitting: Obstetrics and Gynecology

## 2020-06-03 ENCOUNTER — Other Ambulatory Visit: Payer: Self-pay

## 2020-06-03 DIAGNOSIS — R928 Other abnormal and inconclusive findings on diagnostic imaging of breast: Secondary | ICD-10-CM | POA: Diagnosis not present

## 2020-06-03 DIAGNOSIS — N6489 Other specified disorders of breast: Secondary | ICD-10-CM | POA: Diagnosis not present

## 2020-06-06 ENCOUNTER — Encounter: Payer: Self-pay | Admitting: Physical Therapy

## 2020-06-06 ENCOUNTER — Ambulatory Visit: Payer: Medicare PPO | Admitting: Physical Therapy

## 2020-06-06 ENCOUNTER — Other Ambulatory Visit: Payer: Self-pay

## 2020-06-06 DIAGNOSIS — M25611 Stiffness of right shoulder, not elsewhere classified: Secondary | ICD-10-CM | POA: Diagnosis not present

## 2020-06-06 DIAGNOSIS — M25511 Pain in right shoulder: Secondary | ICD-10-CM

## 2020-06-06 DIAGNOSIS — M6281 Muscle weakness (generalized): Secondary | ICD-10-CM

## 2020-06-06 NOTE — Therapy (Signed)
[facility] [place], [place], [address] Phone: [phone]   Fax:  [phone]  Physical Therapy Treatment  Patient Details  Name: Shelly Bennett MRN: [id number] Date of Birth: 25-Mar-1951 Referring Provider (PT): Joni Fears, MD   Encounter Date: 06/06/2020   PT End of Session - 06/06/20 1437    Visit Number 9    Number of Visits 12    Date for PT Re-Evaluation 06/27/20    Authorization Type progress note every 10th visit, KX modifier at 15th visit    Authorization Time Period (6 visits used from previous episode of care; in 2021 calendar year)    PT Start Time 1345    PT Stop Time 1431    PT Time Calculation (min) 46 min    Equipment Utilized During Treatment Other (comment)   SPC   Activity Tolerance Patient tolerated treatment well    Behavior During Therapy [facility] for tasks assessed/performed           Past Medical History:  Diagnosis Date  . Adenomatous colon polyp   . Allergic drug reaction 06/25/2015  . Allergy   . Anxiety   . Arthritis   . At risk for falls    fallen 4 times recently   . CAD (coronary artery disease)    Dr. Burt Knack follows   . Carpal tunnel syndrome   . Chronic headaches   . Coccydynia 02/01/2012  . Cystocele   . Diverticulosis   . External hemorrhoids   . Focal nodular hyperplasia of liver   . Gait abnormality   . GERD (gastroesophageal reflux disease)    some  . HCAP (healthcare-associated pneumonia) 06/25/2015  . Heart murmur   . HLD (hyperlipidemia)   . HTN (hypertension)   . IBS (irritable bowel syndrome)   . Memory loss   . Neuromuscular disorder ([place])    some neuropathy issues in the past- legs fibromyalgia in past-- past hx of steroid injections  . Obesity   . Osteoporosis   . Otitis of left ear 02/01/2017  . Pneumonia   . Skin cancer    squamous cell - face    Past Surgical History:  Procedure Laterality Date  . CARPAL TUNNEL RELEASE    . COLONOSCOPY   12/15/2008   diverticulosis, external hemorrhoids  . KNEE SURGERY  05/22/2015   right  . LAPAROSCOPY    . SKIN SURGERY     squamous cell - right face     There were no vitals filed for this visit.   Subjective Assessment - 06/06/20 1352    Subjective COVID 19 screening performed on patient upon arrival. Patient states that her shoulder feels some better. Patient states that she feels as if her shoulder is popping more today but correlates her pain to wearing a bra.    Pertinent History HTN, heart murmur, IBS, neuropathy, CAD, osteoporosis, memory loss    Limitations House hold activities;Lifting    Diagnostic tests MRI: RTC tendonopathy/tendonisis, articular surface tearing of supraspinatus tendon, AC joint OA, subacromical/subdelotid bursitis, see imaging    Patient Stated Goals decrease pain, improve arm mobility    Currently in Pain? Yes    Pain Score 4     Pain Location Shoulder    Pain Orientation Right    Pain Descriptors / Indicators Discomfort    Pain Type Acute pain    Pain Onset 1 to 4 weeks ago    Pain Frequency Constant  [facility] PT Assessment - 06/06/20 0001      Assessment   Medical Diagnosis Chronic right shoulder pain    Referring Provider (PT) Joni Fears, MD    Next MD Visit June 2021    Prior Therapy not for shoulder      Precautions   Precautions Fall                         [facility] Adult PT Treatment/Exercise - 06/06/20 0001      Shoulder Exercises: Pulleys   Flexion 5 minutes      Shoulder Exercises: ROM/Strengthening   UBE (Upper Arm Bike) 90 RPM x6 min    Ranger Seated UE ranger into flex, CW circles      Shoulder Exercises: Isometric Strengthening   Flexion 5X5"    Extension 5X5"    External Rotation 5X5"    Internal Rotation 5X5"      Modalities   Modalities Electrical Stimulation;Vasopneumatic;Ultrasound      Electrical Stimulation   Electrical Stimulation Location R shoulder    Electrical Stimulation  Action IFC    Electrical Stimulation Parameters 80-150 hz x10 min    Electrical Stimulation Goals Pain      Ultrasound   Ultrasound Location R shoulder    Ultrasound Parameters Combo 1.5 w/cm2, 100%, 1 mhz x10 min    Ultrasound Goals Pain      Vasopneumatic   Number Minutes Vasopneumatic  10 minutes    Vasopnuematic Location  Shoulder    Vasopneumatic Pressure Low    Vasopneumatic Temperature  34                       PT Long Term Goals - 05/23/20 1420      PT LONG TERM GOAL #1   Title Patient will be independent with HEP    Time 6    Period Weeks    Status On-going      PT LONG TERM GOAL #2   Title Patinet will demonstrate 130+ degrees of right shoulder flexion AROM to improve ability to perform overhead tasks and dressing activities.    Time 6    Period Weeks    Status On-going      PT LONG TERM GOAL #3   Title Patient will improver right shoulder MMT to 4+/5 or greater to improve stability during functional tasks.    Time 6    Period Weeks    Status On-going      PT LONG TERM GOAL #4   Title Patient will report ability to perform ADLs, home tasks, and gardening with right shoulder pain less than or equal to 3/10    Time 6    Period Days    Status On-going                 Plan - 06/06/20 1422    Clinical Impression Statement Patient presented in clinic with reports of some R shoulder pain which she correlated to her bra today. Patient guided through light ROM and isometrics with some discomfort reported by end of pulley session. No R wrist complaints reported by patient after UBE. Normal modalities response noted following removal of the modalities.    Personal Factors and Comorbidities Time since onset of injury/illness/exacerbation;Comorbidity 3+;Age;Transportation    Comorbidities HTN, heart murmur, IBS, neuropathy, CAD, osteoporosis, memory loss    Examination-Activity Limitations Dressing;Hygiene/Grooming;Carry;Lift;Reach Overhead;Transfers     Examination-Participation Restrictions Meal Prep;Laundry;[place] [employer] Work  Stability/Clinical Decision Making Stable/Uncomplicated    Rehab Potential Good    PT Frequency 2x / week    PT Duration 6 weeks    PT Treatment/Interventions ADLs/Self Care Home Management;Biofeedback;Cryotherapy;Software engineer;Therapeutic activities;Therapeutic exercise;Balance training;Neuromuscular re-education;Patient/family education;Manual techniques;Passive range of motion;Dry needling;Taping;Ultrasound;Iontophoresis 4mg /ml Dexamethasone    PT Next Visit Plan pulleys, seated ranger for R shoulder, US/Combo as needed for pain relief, modalities as needed.    PT Home Exercise Plan see patient education    Consulted and Agree with Plan of Care Patient           Patient will benefit from skilled therapeutic intervention in order to improve the following deficits and impairments:  Pain, Decreased strength, Decreased range of motion, Decreased balance, Impaired UE functional use, Decreased safety awareness, Postural dysfunction  Visit Diagnosis: Acute pain of right shoulder  Stiffness of right shoulder, not elsewhere classified  Muscle weakness (generalized)     Problem List Patient Active Problem List   Diagnosis Date Noted  . Gait abnormality 03/10/2020  . Chest pain 02/17/2020  . Gastroesophageal reflux disease 10/19/2019  . Hypertensive disorder 10/19/2019  . Third degree uterine prolapse 10/19/2019  . Spinal stenosis of lumbar region without neurogenic claudication 09/15/2019  . Urinary and fecal incontinence 09/15/2019  . Cystocele with rectocele 09/15/2019  . Radial styloid tenosynovitis (de quervain) 06/24/2019  . Trigger finger, right middle finger 06/24/2019  . Pain in left wrist 05/27/2019  . Trigger thumb, right thumb 10/29/2018  . Memory loss 07/22/2018  . Low back pain 07/22/2018  . Left lumbar radiculopathy 01/22/2018  . Mild cognitive impairment  10/22/2017  . Anxiety 10/22/2017  . Aortic atherosclerosis ([place]) 12/04/2016  . Ptosis of right eyelid 02/01/2016  . Dermatochalasis of both upper eyelids 03/07/2015  . Vitamin D deficiency 08/19/2014  . 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 Family history of colon cancer-father at 60+ grandparent w/ rectal cancer 01/14/2014  . MGD (meibomian gland dysfunction) 10/14/2013  . Injection of surface of eye 10/14/2013  . Osteopenia 08/12/2013  . CAD (coronary artery disease), native coronary artery 03/22/2013  . Aortic valve stenosis, mild 03/22/2013  . Hyperlipemia 03/11/2013  . Hypertension 03/11/2013  . Allergic rhinitis 03/11/2013  . Pain in right shoulder 03/11/2013  . IBS (irritable bowel syndrome) - with chronic recurrent abdominal pain 02/01/2012  . Personal history of colonic polyps - adenoma 02/01/2012    Standley Brooking, PTA 06/06/2020, 2:38 PM  [facility] [address] [place], [place], [address] Phone: [phone]   Fax:  [phone]  Name: Shelly Bennett MRN: [id number] Date of Birth: 10-15-1951

## 2020-06-08 ENCOUNTER — Other Ambulatory Visit: Payer: Self-pay

## 2020-06-08 ENCOUNTER — Encounter: Payer: Self-pay | Admitting: Orthopaedic Surgery

## 2020-06-08 ENCOUNTER — Ambulatory Visit: Payer: Medicare PPO | Admitting: Orthopaedic Surgery

## 2020-06-08 DIAGNOSIS — M25511 Pain in right shoulder: Secondary | ICD-10-CM

## 2020-06-08 DIAGNOSIS — M65311 Trigger thumb, right thumb: Secondary | ICD-10-CM

## 2020-06-08 DIAGNOSIS — G8929 Other chronic pain: Secondary | ICD-10-CM | POA: Diagnosis not present

## 2020-06-08 DIAGNOSIS — M65331 Trigger finger, right middle finger: Secondary | ICD-10-CM

## 2020-06-08 NOTE — Progress Notes (Signed)
Office Visit Note   Patient: Shelly Bennett           Date of Birth: [birth year]           MRN: [id number] Visit Date: 06/08/2020              Requested by: Dettinger, Fransisca Kaufmann, MD [place],  [place] [address] PCP: Dettinger, Fransisca Kaufmann, MD   Assessment & Plan: Visit Diagnoses:  1. Trigger finger, right middle finger   2. Trigger thumb, right thumb   3. Chronic right shoulder pain     Plan: Shelly Bennett has been followed primarily for the issue with her right shoulder as outlined in prior office notes.  She has significant partial tearing of the supraspinatus and has been going to physical therapy at [facility] in [place].  She has been "much better".  She can raise her arm overhead and has much better use of her arm.  She is also now using a four-point cane instead of the walker which I think is made a difference.  She has 2 more sessions of therapy and then she will has a home exercise program.  I will plan to see her in follow-up as needed.  She also has triggering of the right thumb and long finger and will try Voltaren gel and use the four-point cane in her left hand.  She has a significant fear of needles and I am hesitant to inject her hand.  She also notes needing a few steps when she walks referable to her left hip.  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   Orders:  No orders of the defined types were placed in this encounter.  No orders of the defined types were placed in this encounter.     Procedures: No procedures performed   Clinical Data: No additional findings.   Subjective: Chief Complaint  Patient presents with  . Right Shoulder - Follow-up    She is doing better, PT is helping her.  Feeling much better in terms of her right shoulder with better range of motion and  less pain.  She is no longer using the rolling walker but using a four-point cane.  Still having some issues with triggering of the right thumb and long finger  HPI  Review of Systems   Objective: Vital Signs: There were no vitals taken for this visit.  Physical Exam Constitutional:      Appearance: She is well-developed.  Eyes:     Pupils: Pupils are equal, round, and reactive to light.  Pulmonary:     Effort: Pulmonary effort is normal.  Skin:    General: Skin is warm and dry.  Neurological:     Mental Status: She is alert and oriented to person, place, and time.  Psychiatric:        Behavior: Behavior normal.     Ortho Exam able to place right arm overhead with minimally positive impingement symptoms.  Good strength.  Good grip.  Skin intact.  No areas of tenderness.  Does have some tenderness over the volar aspect of the metacarpal phalangeal joint right thumb and long finger consistent with her trigger finger.  Cannot actively trigger either of the 2 fingers.  Neurologically intact.  Minimal discomfort with internal and external rotation of both of her  hips range of motion is symmetrical.  Specialty Comments:  No specialty comments available.  Imaging: No results found.   PMFS History: Patient Active Problem List   Diagnosis Date Noted  . Gait abnormality 03/10/2020  . Chest pain 02/17/2020  . Gastroesophageal reflux disease 10/19/2019  . Hypertensive disorder 10/19/2019  . Third degree uterine prolapse 10/19/2019  . Spinal stenosis of lumbar region without neurogenic claudication 09/15/2019  . Urinary and fecal incontinence 09/15/2019  . Cystocele with rectocele 09/15/2019  . Radial styloid tenosynovitis (de quervain) 06/24/2019  . Trigger finger, right middle finger 06/24/2019  . Pain in left wrist 05/27/2019  . Trigger thumb, right thumb 10/29/2018  . Memory loss 07/22/2018  . Low back pain 07/22/2018  . Left lumbar radiculopathy 01/22/2018  . Mild  cognitive impairment 10/22/2017  . Anxiety 10/22/2017  . Aortic atherosclerosis ([place]) 12/04/2016  . Ptosis of right eyelid 02/01/2016  . Dermatochalasis of both upper eyelids 03/07/2015  . Vitamin D deficiency 08/19/2014  . 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 Family history of colon cancer-father at 72+ grandparent w/ rectal cancer 01/14/2014  . MGD (meibomian gland dysfunction) 10/14/2013  . Injection of surface of eye 10/14/2013  . Osteopenia 08/12/2013  . CAD (coronary artery disease), native coronary artery 03/22/2013  . Aortic valve stenosis, mild 03/22/2013  . Hyperlipemia 03/11/2013  . Hypertension 03/11/2013  . Allergic rhinitis 03/11/2013  . Pain in right shoulder 03/11/2013  . IBS (irritable bowel syndrome) - with chronic recurrent abdominal pain 02/01/2012  . Personal history of colonic polyps - adenoma 02/01/2012   Past Medical History:  Diagnosis Date  . Adenomatous colon polyp   . Allergic drug reaction 06/25/2015  . Allergy   . Anxiety   . Arthritis   . At risk for falls    fallen 4 times recently   . CAD (coronary artery disease)    Dr. Burt Knack follows   . Carpal tunnel syndrome   . Chronic headaches   . Coccydynia 02/01/2012  . Cystocele   . Diverticulosis   . External hemorrhoids   . Focal nodular hyperplasia of liver   . Gait abnormality   . GERD (gastroesophageal reflux disease)    some  . HCAP (healthcare-associated pneumonia) 06/25/2015  . Heart murmur   . HLD (hyperlipidemia)   . HTN (hypertension)   . IBS (irritable bowel syndrome)   . Memory loss   . Neuromuscular disorder ([place])    some neuropathy issues in the past- legs fibromyalgia in past-- past hx of steroid injections  . Obesity   . Osteoporosis   . Otitis of left ear 02/01/2017  . Pneumonia   . Skin cancer    squamous cell - face    Family History  Problem Relation Age of Onset  . Prostate cancer Father   . Colon cancer Father 69  . Kidney disease  Father   . Heart disease Father   . Alzheimer's disease Father   . Hyperlipidemia Mother   . Hypertension Mother   . Kidney disease Mother   . Osteoporosis Mother   . Heart murmur Mother   . Dementia Mother   . Hyperlipidemia Sister   . Hypertension Sister   . Heart disease Brother 25       not dx questionable   . Heart disease Maternal Grandfather   . Rectal cancer Maternal Grandmother 38  . Colon polyps Neg Hx   . Esophageal cancer Neg Hx   . Stomach cancer  Neg Hx     Past Surgical History:  Procedure Laterality Date  . CARPAL TUNNEL RELEASE    . COLONOSCOPY  12/15/2008   diverticulosis, external hemorrhoids  . KNEE SURGERY  05/22/2015   right  . LAPAROSCOPY    . SKIN SURGERY     squamous cell - right face    Social History   Occupational History  . Occupation: Pharmacist, hospital retired  Tobacco Use  . Smoking status: Never Smoker  . Smokeless tobacco: Never Used  Vaping Use  . Vaping Use: Never used  Substance and Sexual Activity  . Alcohol use: No  . Drug use: No  . Sexual activity: Not Currently    Birth control/protection: None     Garald Balding, MD   Note - This record has been created using [employer].  Chart creation errors have been sought, but may not always  have been located. Such creation errors do not reflect on  the standard of medical care.

## 2020-06-09 ENCOUNTER — Ambulatory Visit: Payer: Medicare PPO | Admitting: *Deleted

## 2020-06-09 ENCOUNTER — Other Ambulatory Visit: Payer: Self-pay

## 2020-06-09 DIAGNOSIS — M6281 Muscle weakness (generalized): Secondary | ICD-10-CM

## 2020-06-09 DIAGNOSIS — M25511 Pain in right shoulder: Secondary | ICD-10-CM | POA: Diagnosis not present

## 2020-06-09 DIAGNOSIS — M25611 Stiffness of right shoulder, not elsewhere classified: Secondary | ICD-10-CM

## 2020-06-09 NOTE — Therapy (Signed)
[facility] [place], [place], [address] Phone: [phone]   Fax:  [phone]  Physical Therapy Treatment  Patient Details  Name: Shelly Bennett MRN: [id number] Date of Birth: [birth year] Referring Provider (PT): Joni Fears, MD   Encounter Date: 06/09/2020   PT End of Session - 06/09/20 1432    Visit Number 10    Number of Visits 12    Date for PT Re-Evaluation 06/27/20    Authorization Type progress note every 10th visit, KX modifier at 15th visit    Authorization Time Period (6 visits used from previous episode of care; in 2021 calendar year)    PT Start Time 67    PT Stop Time 1432    PT Time Calculation (min) 47 min           Past Medical History:  Diagnosis Date  . Adenomatous colon polyp   . Allergic drug reaction 06/25/2015  . Allergy   . Anxiety   . Arthritis   . At risk for falls    fallen 4 times recently   . CAD (coronary artery disease)    Dr. Burt Knack follows   . Carpal tunnel syndrome   . Chronic headaches   . Coccydynia 02/01/2012  . Cystocele   . Diverticulosis   . External hemorrhoids   . Focal nodular hyperplasia of liver   . Gait abnormality   . GERD (gastroesophageal reflux disease)    some  . HCAP (healthcare-associated pneumonia) 06/25/2015  . Heart murmur   . HLD (hyperlipidemia)   . HTN (hypertension)   . IBS (irritable bowel syndrome)   . Memory loss   . Neuromuscular disorder ([place])    some neuropathy issues in the past- legs fibromyalgia in past-- past hx of steroid injections  . Obesity   . Osteoporosis   . Otitis of left ear 02/01/2017  . Pneumonia   . Skin cancer    squamous cell - face    Past Surgical History:  Procedure Laterality Date  . CARPAL TUNNEL RELEASE    . COLONOSCOPY  12/15/2008   diverticulosis, external hemorrhoids  . KNEE SURGERY  05/22/2015   right  . LAPAROSCOPY    . SKIN SURGERY     squamous cell - right face     There were no  vitals filed for this visit.   Subjective Assessment - 06/09/20 1357    Subjective COVID 19 screening performed on patient upon arrival. Patient states that her shoulder feels some better and Dr was pleased.    Pertinent History HTN, heart murmur, IBS, neuropathy, CAD, osteoporosis, memory loss    Diagnostic tests MRI: RTC tendonopathy/tendonisis, articular surface tearing of supraspinatus tendon, AC joint OA, subacromical/subdelotid bursitis, see imaging    Patient Stated Goals decrease pain, improve arm mobility    Currently in Pain? Yes    Pain Score 3     Pain Location Shoulder    Pain Orientation Right    Pain Descriptors / Indicators Sore;Discomfort                             [facility] Adult PT Treatment/Exercise - 06/09/20 0001      Shoulder Exercises: Pulleys   Flexion 5 minutes      Shoulder Exercises: ROM/Strengthening   UBE (Upper Arm Bike) 90 RPM x6 min    Ranger Seated UE ranger into flex, CW circles      Modalities  Modalities Electrical Stimulation;Vasopneumatic;Ultrasound      Buyer, retail Action IFC x15 mins    Electrical Stimulation Parameters 80-150hz     Electrical Stimulation Goals Pain      Ultrasound   Ultrasound Location RT shldr    Ultrasound Parameters Combo 1.5 w/cm2 x 8 mins    Ultrasound Goals Pain      Vasopneumatic   Number Minutes Vasopneumatic  10 minutes    Vasopnuematic Location  Shoulder    Vasopneumatic Pressure Low    Vasopneumatic Temperature  34                       PT Long Term Goals - 05/23/20 1420      PT LONG TERM GOAL #1   Title Patient will be independent with HEP    Time 6    Period Weeks    Status On-going      PT LONG TERM GOAL #2   Title Patinet will demonstrate 130+ degrees of right shoulder flexion AROM to improve ability to perform overhead tasks and dressing activities.    Time 6    Period Weeks     Status On-going      PT LONG TERM GOAL #3   Title Patient will improver right shoulder MMT to 4+/5 or greater to improve stability during functional tasks.    Time 6    Period Weeks    Status On-going      PT LONG TERM GOAL #4   Title Patient will report ability to perform ADLs, home tasks, and gardening with right shoulder pain less than or equal to 3/10    Time 6    Period Days    Status On-going                 Plan - 06/09/20 1427    Clinical Impression Statement Pt arrived todayreporting MD being pleased with status and wants her to finish PT visits. She did well with PT Therex with mainly fatigue end of Rx. Normal modality response today    Comorbidities HTN, heart murmur, IBS, neuropathy, CAD, osteoporosis, memory loss    Examination-Activity Limitations Dressing;Hygiene/Grooming;Carry;Lift;Reach Overhead;Transfers    Stability/Clinical Decision Making Stable/Uncomplicated    PT Frequency 2x / week    PT Duration 6 weeks    PT Treatment/Interventions ADLs/Self Care Home Management;Biofeedback;Cryotherapy;Software engineer;Therapeutic activities;Therapeutic exercise;Balance training;Neuromuscular re-education;Patient/family education;Manual techniques;Passive range of motion;Dry needling;Taping;Ultrasound;Iontophoresis 4mg /ml Dexamethasone    PT Next Visit Plan pulleys, seated ranger for R shoulder, US/Combo as needed for pain relief, modalities as needed.                        2 visits left and then DC to HEP    Consulted and Agree with Plan of Care Patient           Patient will benefit from skilled therapeutic intervention in order to improve the following deficits and impairments:     Visit Diagnosis: Acute pain of right shoulder  Stiffness of right shoulder, not elsewhere classified  Muscle weakness (generalized)     Problem List Patient Active Problem List   Diagnosis Date Noted  . Gait abnormality 03/10/2020  . Chest  pain 02/17/2020  . Gastroesophageal reflux disease 10/19/2019  . Hypertensive disorder 10/19/2019  . Third degree uterine prolapse 10/19/2019  . Spinal stenosis of lumbar region without neurogenic claudication 09/15/2019  .  Urinary and fecal incontinence 09/15/2019  . Cystocele with rectocele 09/15/2019  . Radial styloid tenosynovitis (de quervain) 06/24/2019  . Trigger finger, right middle finger 06/24/2019  . Pain in left wrist 05/27/2019  . Trigger thumb, right thumb 10/29/2018  . Memory loss 07/22/2018  . Low back pain 07/22/2018  . Left lumbar radiculopathy 01/22/2018  . Mild cognitive impairment 10/22/2017  . Anxiety 10/22/2017  . Aortic atherosclerosis ([place]) 12/04/2016  . Ptosis of right eyelid 02/01/2016  . Dermatochalasis of both upper eyelids 03/07/2015  . Vitamin D deficiency 08/19/2014  . 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 Family history of colon cancer-father at 64+ grandparent w/ rectal cancer 01/14/2014  . MGD (meibomian gland dysfunction) 10/14/2013  . Injection of surface of eye 10/14/2013  . Osteopenia 08/12/2013  . CAD (coronary artery disease), native coronary artery 03/22/2013  . Aortic valve stenosis, mild 03/22/2013  . Hyperlipemia 03/11/2013  . Hypertension 03/11/2013  . Allergic rhinitis 03/11/2013  . Pain in right shoulder 03/11/2013  . IBS (irritable bowel syndrome) - with chronic recurrent abdominal pain 02/01/2012  . Personal history of colonic polyps - adenoma 02/01/2012    Darelle Kings,CHRIS, PTA 06/09/2020, 6:22 PM  [facility] [address] [place], [place], [address] Phone: [phone]   Fax:  [phone]  Name: Aspynn Clover MRN: [id number] Date of Birth: 1950/12/25

## 2020-06-14 ENCOUNTER — Encounter: Payer: Self-pay | Admitting: Physical Therapy

## 2020-06-14 ENCOUNTER — Ambulatory Visit: Payer: Medicare PPO | Admitting: Physical Therapy

## 2020-06-14 ENCOUNTER — Other Ambulatory Visit: Payer: Self-pay

## 2020-06-14 DIAGNOSIS — M25611 Stiffness of right shoulder, not elsewhere classified: Secondary | ICD-10-CM | POA: Diagnosis not present

## 2020-06-14 DIAGNOSIS — M6281 Muscle weakness (generalized): Secondary | ICD-10-CM

## 2020-06-14 DIAGNOSIS — M25511 Pain in right shoulder: Secondary | ICD-10-CM | POA: Diagnosis not present

## 2020-06-14 NOTE — Therapy (Signed)
[facility] [place], [place], [address] Phone: [phone]   Fax:  [phone]  Physical Therapy Treatment  Patient Details  Name: Shelly Bennett MRN: [id number] Date of Birth: [birth year] Referring Provider (PT): Joni Fears, MD   Encounter Date: 06/14/2020   PT End of Session - 06/14/20 1429    Visit Number 11    Number of Visits 12    Date for PT Re-Evaluation 06/27/20    Authorization Type progress note every 10th visit, KX modifier at 15th visit    Authorization Time Period (6 visits used from previous episode of care; in 2021 calendar year)    PT Start Time 1351    PT Stop Time 1440    PT Time Calculation (min) 49 min    Equipment Utilized During Treatment Other (comment)   SPC   Activity Tolerance Patient tolerated treatment well    Behavior During Therapy [facility] for tasks assessed/performed           Past Medical History:  Diagnosis Date   Adenomatous colon polyp    Allergic drug reaction 06/25/2015   Allergy    Anxiety    Arthritis    At risk for falls    fallen 4 times recently    CAD (coronary artery disease)    Dr. Burt Knack follows    Carpal tunnel syndrome    Chronic headaches    Coccydynia 02/01/2012   Cystocele    Diverticulosis    External hemorrhoids    Focal nodular hyperplasia of liver    Gait abnormality    GERD (gastroesophageal reflux disease)    some   HCAP (healthcare-associated pneumonia) 06/25/2015   Heart murmur    HLD (hyperlipidemia)    HTN (hypertension)    IBS (irritable bowel syndrome)    Memory loss    Neuromuscular disorder ([place])    some neuropathy issues in the past- legs fibromyalgia in past-- past hx of steroid injections   Obesity    Osteoporosis    Otitis of left ear 02/01/2017   Pneumonia    Skin cancer    squamous cell - face    Past Surgical History:  Procedure Laterality Date   CARPAL TUNNEL RELEASE     COLONOSCOPY   12/15/2008   diverticulosis, external hemorrhoids   KNEE SURGERY  05/22/2015   right   LAPAROSCOPY     SKIN SURGERY     squamous cell - right face     There were no vitals filed for this visit.   Subjective Assessment - 06/14/20 1428    Subjective COVID 19 screening performed on patient upon arrival. Patient states feeling better and she was able to get into the pool over the weekend.    Pertinent History HTN, heart murmur, IBS, neuropathy, CAD, osteoporosis, memory loss    Limitations House hold activities;Lifting    Diagnostic tests MRI: RTC tendonopathy/tendonisis, articular surface tearing of supraspinatus tendon, AC joint OA, subacromical/subdelotid bursitis, see imaging    Patient Stated Goals decrease pain, improve arm mobility    Currently in Pain? Yes   did not provide number on pain scale             [facility] PT Assessment - 06/14/20 0001      Assessment   Medical Diagnosis Chronic right shoulder pain    Referring Provider (PT) Joni Fears, MD    Next MD Visit June 2021    Prior Therapy not for shoulder  Precautions   Precautions Fall                         [facility] Adult PT Treatment/Exercise - 06/14/20 0001      Shoulder Exercises: Pulleys   Flexion 5 minutes      Shoulder Exercises: ROM/Strengthening   UBE (Upper Arm Bike) 120 RPM x8 min      Modalities   Modalities Electrical Stimulation;Vasopneumatic;Ultrasound      Electrical Stimulation   Electrical Stimulation Location R shoulder    Electrical Stimulation Action IFC    Electrical Stimulation Parameters 80-150 hz x15 mins    Electrical Stimulation Goals Pain      Ultrasound   Ultrasound Location right shoulder    Ultrasound Parameters combo US/E-stim 100% 1.5 w/cm2 1 mhz x10 mins    Ultrasound Goals Pain      Vasopneumatic   Number Minutes Vasopneumatic  15 minutes    Vasopnuematic Location  Shoulder    Vasopneumatic Pressure Low    Vasopneumatic Temperature  34                         PT Long Term Goals - 05/23/20 1420      PT LONG TERM GOAL #1   Title Patient will be independent with HEP    Time 6    Period Weeks    Status On-going      PT LONG TERM GOAL #2   Title Patinet will demonstrate 130+ degrees of right shoulder flexion AROM to improve ability to perform overhead tasks and dressing activities.    Time 6    Period Weeks    Status On-going      PT LONG TERM GOAL #3   Title Patient will improver right shoulder MMT to 4+/5 or greater to improve stability during functional tasks.    Time 6    Period Weeks    Status On-going      PT LONG TERM GOAL #4   Title Patient will report ability to perform ADLs, home tasks, and gardening with right shoulder pain less than or equal to 3/10    Time 6    Period Days    Status On-going                 Plan - 06/14/20 1430    Clinical Impression Statement Patient responded well to therapy session but with more fatigue with UBE. Resistance returned to 120 RPM in which she performed much better. Patient responded well to combo e-stim/US with reports of decreased pain. Normal response to modalities upon removal of modalities.    Personal Factors and Comorbidities Time since onset of injury/illness/exacerbation;Comorbidity 3+;Age;Transportation    Comorbidities HTN, heart murmur, IBS, neuropathy, CAD, osteoporosis, memory loss    Examination-Activity Limitations Dressing;Hygiene/Grooming;Carry;Lift;Reach Overhead;Transfers    Examination-Participation Restrictions Meal Prep;Laundry;Yard Work    Stability/Clinical Decision Making Stable/Uncomplicated    Designer, jewellery Low    Rehab Potential Good    PT Frequency 2x / week    PT Duration 6 weeks    PT Treatment/Interventions ADLs/Self Care Home Management;Biofeedback;Cryotherapy;Software engineer;Therapeutic activities;Therapeutic exercise;Balance training;Neuromuscular  re-education;Patient/family education;Manual techniques;Passive range of motion;Dry needling;Taping;Ultrasound;Iontophoresis 4mg /ml Dexamethasone    PT Next Visit Plan provide HEP and bands; DC    PT Home Exercise Plan see patient education    Consulted and Agree with Plan of Care Patient  Patient will benefit from skilled therapeutic intervention in order to improve the following deficits and impairments:  Pain, Decreased strength, Decreased range of motion, Decreased balance, Impaired UE functional use, Decreased safety awareness, Postural dysfunction  Visit Diagnosis: Acute pain of right shoulder  Stiffness of right shoulder, not elsewhere classified  Muscle weakness (generalized)     Problem List Patient Active Problem List   Diagnosis Date Noted   Gait abnormality 03/10/2020   Chest pain 02/17/2020   Gastroesophageal reflux disease 10/19/2019   Hypertensive disorder 10/19/2019   Third degree uterine prolapse 10/19/2019   Spinal stenosis of lumbar region without neurogenic claudication 09/15/2019   Urinary and fecal incontinence 09/15/2019   Cystocele with rectocele 09/15/2019   Radial styloid tenosynovitis (de quervain) 06/24/2019   Trigger finger, right middle finger 06/24/2019   Pain in left wrist 05/27/2019   Trigger thumb, right thumb 10/29/2018   Memory loss 07/22/2018   Low back pain 07/22/2018   Left lumbar radiculopathy 01/22/2018   Mild cognitive impairment 10/22/2017   Anxiety 10/22/2017   Aortic atherosclerosis ([place]) 12/04/2016   Ptosis of right eyelid 02/01/2016   Dermatochalasis of both upper eyelids 03/07/2015   Vitamin D deficiency 08/19/2014   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 94/32/7614   Family history of colon cancer-father at 58+ grandparent w/ rectal cancer 01/14/2014   MGD (meibomian gland dysfunction) 10/14/2013   Injection of surface of eye 10/14/2013   Osteopenia 08/12/2013   CAD  (coronary artery disease), native coronary artery 03/22/2013   Aortic valve stenosis, mild 03/22/2013   Hyperlipemia 03/11/2013   Hypertension 03/11/2013   Allergic rhinitis 03/11/2013   Pain in right shoulder 03/11/2013   IBS (irritable bowel syndrome) - with chronic recurrent abdominal pain 02/01/2012   Personal history of colonic polyps - adenoma 02/01/2012    Gabriela Eves, PT, DPT 06/14/2020, 2:55 PM  [facility] [address] [place], [place], [address] Phone: [phone]   Fax:  [phone]  Name: Shelly Bennett MRN: [id number] Date of Birth: 05/10/1951

## 2020-06-15 ENCOUNTER — Encounter: Payer: Medicare PPO | Admitting: Physical Therapy

## 2020-06-16 ENCOUNTER — Other Ambulatory Visit: Payer: Self-pay

## 2020-06-16 ENCOUNTER — Ambulatory Visit: Payer: Medicare PPO | Admitting: Neurology

## 2020-06-16 ENCOUNTER — Ambulatory Visit (INDEPENDENT_AMBULATORY_CARE_PROVIDER_SITE_OTHER): Payer: Medicare PPO | Admitting: *Deleted

## 2020-06-16 ENCOUNTER — Encounter: Payer: Self-pay | Admitting: Neurology

## 2020-06-16 VITALS — BP 157/77 | HR 60 | Ht 68.0 in | Wt 201.0 lb

## 2020-06-16 DIAGNOSIS — M48061 Spinal stenosis, lumbar region without neurogenic claudication: Secondary | ICD-10-CM | POA: Diagnosis not present

## 2020-06-16 DIAGNOSIS — R413 Other amnesia: Secondary | ICD-10-CM | POA: Diagnosis not present

## 2020-06-16 DIAGNOSIS — R269 Unspecified abnormalities of gait and mobility: Secondary | ICD-10-CM

## 2020-06-16 DIAGNOSIS — Z0000 Encounter for general adult medical examination without abnormal findings: Secondary | ICD-10-CM

## 2020-06-16 MED ORDER — MEMANTINE HCL 10 MG PO TABS
10.0000 mg | ORAL_TABLET | Freq: Two times a day (BID) | ORAL | 11 refills | Status: DC
Start: 2020-06-16 — End: 2021-07-20

## 2020-06-16 MED ORDER — DONEPEZIL HCL 10 MG PO TABS: 10.0000 mg | ORAL_TABLET | Freq: Every day | ORAL | 11 refills | Status: DC

## 2020-06-16 NOTE — Progress Notes (Signed)
PATIENT: Shelly Bennett DOB: 1951/01/04  Chief Complaint  Patient presents with  . Gait Problem    She is here with her daughter, Shelly Bennett. She is using a cane to assist with ambulation. They would like to review her cervical MRI results.   . Memory Loss    Feels memory is about the same.  Recent MMSE 28/30 on 03/10/20.     HISTORICAL  Shelly Bennett is a 69 years old female, seen in refer by her primary care doctor Redge Gainer for evaluation of anxiety, memory loss, initial evaluation was on November 6th 2018.   I have reviewed and summarized the referring note, she has history of hypertension, coronary artery disease, hyperlipidemia, is a retired Automotive engineer. She drove herself to clinic today.  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   Laboratory evaluations, B12 474, normal liver functional tests, vitamin D level 57, lipid profile LDL 82, total cholesterol 169,  UPDATE Feb 6th 2019: She continue has mild memory loss, Mini-Mental Status Examination 28/30, MRI of the brain showed moderate generalized atrophy, ventriculomegaly mild supratentorium small vessel disease  She also complains of chronic left low back pain radiating pain to left hip, I have personally reviewed MRI lumbar in 2016, multilevel degenerative changes, most severe at L3-4, with moderate spinal stenosis, bilateral lateral recess stenosis.  UPDATE August 6th 2019: She still has mild memory loss, misplace things, her mother passed away on 15-May-2018, her left leg pain, low back pain has much improved after physical  therapy  UPDATE March 10 2020: She is accompanied by her daughter Shelly Bennett at today's visit, she had worsening gait abnormality, memory loss, initially contributed to her depression of losing her mother in 2020, but her symptoms continue to progress over the past 1 year, especially her gait abnormality, she fell few times in her yard, went to emergency room on February 10, 2020, fell about 4 feet off the edge of her porch, landed on her right side  She denies significant low back pain, denies radiating pain to bilateral lower extremity, or lower extremity paresthesia, denies bowel bladder incontinence  I personally reviewed MRI of the brain in December 2020, generalized atrophy, mild supratentorial and small vessel disease, there was no acute abnormalities.  Laboratory evaluations in December 2020, positive COVID-19, normal and active RPR, Lyme titer, B12, ESR, arthritis panel, vitamin D, CBC, CMP elevated high-sensitivity C-reactive protein 4.8, positive scleroderma antibody, normal thyroid functional test  I personally reviewed MRI of lumbar spine in December 2019, severe spinal stenosis at L3-4, due to circumferential disc bulging, moderate facet and ligamentum flavum hypertrophy, with progressive severe spinal stenosis, mild left greater than right lateral recess stenosis, acute mild L1 and 2 compression fracture  UPDATE June 16 2020: She is accompanied by her daughter Shelly Bennett at today's clinical visit, her memory is relatively stable, Mini-Mental Status Examination in March was 28/30,  She was noted to have mild worsening of gait abnormality, contributed to her low back, hip pain We also personally reviewed MRI of the cervical spine in May 2021, multilevel degenerative changes, but no evidence of significant  canal or foraminal stenosis.  MRI of brain in December 2020, no acute abnormality, mild supratentorium small vessel disease, unchanged mild enlarged bilateral lateral ventricle and third  ventricle, mildly out of proportion to generalized brain atrophy, but no significant change compared to previous MRI in 2018  REVIEW OF SYSTEMS: Full 14 system review of systems performed and notable only for as above All other review of systems were negative.  ALLERGIES: Allergies  Allergen Reactions  . Codeine     ? Reaction ER visit.   . Mold Extract [Trichophyton]     Migraine  . Penicillins Other (See Comments)    "drew my legs up as child" Pt has tolerated cephalexin in the past.  . Ace Inhibitors Cough  . Angiotensin Receptor Blockers Cough  . Levaquin [Levofloxacin] Rash    Per notes from outpatient provider  . Vytorin [Ezetimibe-Simvastatin] Other (See Comments)    cramping  . Zetia [Ezetimibe] Other (See Comments)    cramping    HOME MEDICATIONS: Current Outpatient Medications  Medication Sig Dispense Refill  . amLODipine (NORVASC) 10 MG tablet TAKE (1/2) TABLET DAILY. 45 tablet 3  . aspirin 81 MG tablet Take 81 mg by mouth daily.     . buPROPion (WELLBUTRIN XL) 150 MG 24 hr tablet Take 1 tablet (150 mg total) by mouth daily. 90 tablet 3  . Calcium Carbonate (CALCARB 600 PO) Take by mouth.    . Cholecalciferol (VITAMIN D-3) 5000 UNITS TABS Take 1 capsule by mouth as directed. Take one capsule by mouth daily Mon thru Friday    . fexofenadine (ALLEGRA) 180 MG tablet Take 180 mg by mouth daily.    . fluticasone (FLONASE) 50 MCG/ACT nasal spray Place 2 sprays into both nostrils daily. 16 g 3  . hydrochlorothiazide (HYDRODIURIL) 25 MG tablet TAKE (1/2) TABLET DAILY. 45 tablet 3  . HYDROcodone-acetaminophen (NORCO/VICODIN) 5-325 MG tablet Take 1 tablet by mouth every 6 (six) hours as needed for moderate pain. 30 tablet 0  . magic mouthwash w/lidocaine SOLN Take 5 mLs by mouth 3 (three) times daily as needed for mouth pain. Mixed with equal parts of diphenhydramine Maalox nystatin and viscous lidocaine 90 mL 0  . montelukast (SINGULAIR) 10 MG tablet Take 1 tablet (10 mg total)  by mouth at bedtime. 90 tablet 3  . omega-3 acid ethyl esters (LOVAZA) 1 g capsule TAKE (1) CAPSULE FOUR TIMES DAILY. 360 capsule 0  . rosuvastatin (CRESTOR) 20 MG tablet Take 1 tablet (20 mg total) by mouth daily. 90 tablet 3   No current facility-administered medications for this visit.    PAST MEDICAL HISTORY: Past Medical History:  Diagnosis Date  . Adenomatous colon polyp   . Allergic drug reaction 06/25/2015  . Allergy   . Anxiety   . Arthritis   . At risk for falls    fallen 4 times recently   . CAD (coronary artery disease)    Dr. Cooper follows   . Carpal tunnel syndrome   . Chronic headaches   . Coccydynia 02/01/2012  . Cystocele   . Diverticulosis   . External hemorrhoids   . Focal nodular hyperplasia of liver   . Gait abnormality   . GERD (gastroesophageal reflux disease)    some  . HCAP (healthcare-associated pneumonia) 06/25/2015  . Heart murmur   . HLD (hyperlipidemia)   . HTN (hypertension)   . IBS (irritable bowel syndrome)   . Memory loss   . Neuromuscular disorder ([facility])    some neuropathy issues   in the past- legs fibromyalgia in past-- past hx of steroid injections  . Obesity   . Osteoporosis   . Otitis of left ear 02/01/2017  . Pneumonia   . Skin cancer    squamous cell - face    PAST SURGICAL HISTORY: Past Surgical History:  Procedure Laterality Date  . CARPAL TUNNEL RELEASE    . COLONOSCOPY  12/15/2008   diverticulosis, external hemorrhoids  . KNEE SURGERY  05/22/2015   right  . LAPAROSCOPY    . SKIN SURGERY     squamous cell - right face     FAMILY HISTORY: Family History  Problem Relation Age of Onset  . Prostate cancer Father   . Colon cancer Father 60  . Kidney disease Father   . Heart disease Father   . Alzheimer's disease Father   . Hyperlipidemia Mother   . Hypertension Mother   . Kidney disease Mother   . Osteoporosis Mother   . Heart murmur Mother   . Dementia Mother   . Hyperlipidemia Sister   . Hypertension Sister    . Heart disease Brother 68       not dx questionable   . Heart disease Maternal Grandfather   . Rectal cancer Maternal Grandmother 55  . Colon polyps Neg Hx   . Esophageal cancer Neg Hx   . Stomach cancer Neg Hx     SOCIAL HISTORY: Social History   Socioeconomic History  . Marital status: Married    Spouse name: Thomas  . Number of children: 4  . Years of education: 16  . Highest education level: Bachelor's degree (e.g., BA, AB, BS)  Occupational History  . Occupation: teacher retired  Tobacco Use  . Smoking status: Never Smoker  . Smokeless tobacco: Never Used  Vaping Use  . Vaping Use: Never used  Substance and Sexual Activity  . Alcohol use: No  . Drug use: No  . Sexual activity: Not Currently    Birth control/protection: None  Other Topics Concern  . Not on file  Social History Narrative   Lives at home with her husband.   Right-handed.   1cup coffee each morning. 2-3 small soda cans each day.   Social Determinants of Health   Financial Resource Strain:   . Difficulty of Paying Living Expenses:   Food Insecurity:   . Worried About Running Out of Food in the Last Year:   . Ran Out of Food in the Last Year:   Transportation Needs:   . Lack of Transportation (Medical):   . Lack of Transportation (Non-Medical):   Physical Activity:   . Days of Exercise per Week:   . Minutes of Exercise per Session:   Stress:   . Feeling of Stress :   Social Connections:   . Frequency of Communication with Friends and Family:   . Frequency of Social Gatherings with Friends and Family:   . Attends Religious Services:   . Active Member of Clubs or Organizations:   . Attends Club or Organization Meetings:   . Marital Status:   Intimate Partner Violence:   . Fear of Current or Ex-Partner:   . Emotionally Abused:   . Physically Abused:   . Sexually Abused:      PHYSICAL EXAM   Vitals:   06/16/20 1520  BP: (!) 157/77  Pulse: 60  Weight: 201 lb (91.2 kg)  Height: 5'  8" (1.727 m)   Not recorded     Body mass index is 30.56   kg/m.  PHYSICAL EXAMNIATION:  Gen: NAD, conversant, well nourised, well groomed                     Cardiovascular: Regular rate rhythm, no peripheral edema, warm, nontender. Eyes: Conjunctivae clear without exudates or hemorrhage Neck: Supple, no carotid bruits. Pulmonary: Clear to auscultation bilaterally   NEUROLOGICAL EXAM:  MMSE - Mini Mental State Exam 03/10/2020 02/05/2019 01/30/2018  Orientation to time 5 4 5  Orientation to Place 5 5 5  Registration 3 3 3  Attention/ Calculation 5 5 5  Recall 1 3 3  Language- name 2 objects 2 2 2  Language- repeat 1 1 1  Language- follow 3 step command 3 3 3  Language- read & follow direction 1 1 1  Write a sentence 1 1 1  Copy design 1 1 1  Total score 28 29 30  animal naming 12   CRANIAL NERVES: CN II: Visual fields are full to confrontation. Pupils are round equal and briskly reactive to light. CN III, IV, VI: extraocular movement are normal. No ptosis. CN V: Facial sensation is intact to light touch CN VII: Face is symmetric with normal eye closure  CN VIII: Hearing is normal to causal conversation. CN IX, X: Phonation is normal. CN XI: Head turning and shoulder shrug are intact  MOTOR: There is no pronator drift of out-stretched arms. Muscle bulk and tone are normal. Muscle strength is normal.  REFLEXES: Reflexes are 2+ and symmetric at the biceps, triceps, 3/3 knees, and ankles. Plantar responses are flexor.  SENSORY: Intact to light touch, pinprick and vibratory sensation are intact in fingers and toes.  COORDINATION: There is no trunk or limb dysmetria noted.  GAIT/STANCE: She needs push-up to get up from seated position, wide-based, stiff, cautious gait  DIAGNOSTIC DATA (LABS, IMAGING, TESTING) - I reviewed patient records, labs, notes, testing and imaging myself where available.   ASSESSMENT AND PLAN  Shelly Bennett is a 68 y.o. 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,  Laboratory evaluation showed no treatable etiology, negative RPR, normal B12, TSH   Compared to MRI of the brain in December 2020 to November 2018, there was no significant change, even though her ventriculomegaly is mildly out of proportion to her generalized atrophy, the slow progressive clinical history, and stability on imaging study does not support a diagnosis of normal pressure hydrocephalus,  We have decided to start on Aricept, Namenda  Gait abnormality  Likely multifactorial, due to her chronic low back pain, hip pain, aging, deconditioning, ventriculomegaly, supratentorium small vessel disease  MRI of cervical spine showed no significant canal foraminal narrowing  Encouraged her to continue moderate exercise    , M.D. Ph.D.  [facility] [address], [address] [place], [place] [address] Ph: [phone] Fax: [phone]  CC: Dettinger, Joshua A, MD 

## 2020-06-16 NOTE — Progress Notes (Signed)
MEDICARE ANNUAL WELLNESS VISIT  06/16/2020  Telephone Visit Disclaimer This Medicare AWV was conducted by telephone due to national recommendations for restrictions regarding the COVID-19 Pandemic (e.g. social distancing).  I verified, using two identifiers, that I am speaking with Shelly Bennett or their authorized healthcare agent. I discussed the limitations, risks, security, and privacy concerns of performing an evaluation and management service by telephone and the potential availability of an in-person appointment in the future. The patient expressed understanding and agreed to proceed.   Subjective:  Shelly Bennett is a 69 y.o. female patient of Dettinger, Shelly Kaufmann, MD who had a Medicare Annual Wellness Visit today via telephone. Somara is retired and lives with her husband Shelly Bennett. She has 4 children. She reports that she is socially active, involved in her church, and does interact with friends/family regularly. She is minimally physically active and enjoys gardening and spending time with her grandchildren.  Patient Care Team: Dettinger, Shelly Kaufmann, MD as PCP - General (Family Medicine) Sherren Mocha, MD as PCP - Cardiology (Cardiology) Hillary Bow, MD (Cardiology) Sherren Mocha, MD (Cardiology) Gatha Mayer, MD (Gastroenterology) Newton Pigg, MD (Obstetrics and Gynecology) Garald Balding, MD (Orthopedic Surgery) Marcial Pacas, MD as Consulting Physician (Neurology)  Advanced Directives 06/16/2020 02/10/2020 11/24/2019 10/17/2019 06/16/2019 01/30/2018 01/28/2018  Does Patient Have a Medical Advance Directive? No No Yes No No No No  Type of Advance Directive - - Living will - - - -  Would patient like information on creating a medical advance directive? No - Patient declined No - Patient declined - No - Patient declined Yes (MAU/Ambulatory/Procedural Areas - Information given) Yes (MAU/Ambulatory/Procedural Areas - Information given) -    Hospital  Utilization Over the Past 12 Months: # of hospitalizations or ER visits: 1 # of surgeries: 0  Review of Systems    Patient reports that her overall health is worse compared to last year.  History obtained from the patient and patient chart.   Patient Reported Readings (BP, Pulse, CBG, Weight, etc) none  Pain Assessment Pain : No/denies pain     Current Medications & Allergies (verified) Allergies as of 06/16/2020      Reactions   Codeine    ? Reaction ER visit.    Mold Extract [trichophyton]    Migraine   Penicillins Other (See Comments)   "drew my legs up as child" Pt has tolerated cephalexin in the past.   Ace Inhibitors Cough   Angiotensin Receptor Blockers Cough   Levaquin [levofloxacin] Rash   Per notes from outpatient provider   Vytorin [ezetimibe-simvastatin] Other (See Comments)   cramping   Zetia [ezetimibe] Other (See Comments)   cramping      Medication List       Accurate as of June 16, 2020 10:46 AM. If you have any questions, ask your nurse or doctor.        amLODipine 10 MG tablet Commonly known as: NORVASC TAKE (1/2) TABLET DAILY.   aspirin 81 MG tablet Take 81 mg by mouth daily.   buPROPion 150 MG 24 hr tablet Commonly known as: Wellbutrin XL Take 1 tablet (150 mg total) by mouth daily.   CALCARB 600 PO Take by mouth.   fexofenadine 180 MG tablet Commonly known as: ALLEGRA Take 180 mg by mouth daily.   fluticasone 50 MCG/ACT nasal spray Commonly known as: FLONASE Place 2 sprays into both nostrils daily.   hydrochlorothiazide 25 MG tablet Commonly known as: HYDRODIURIL TAKE (1/2)  TABLET DAILY.   HYDROcodone-acetaminophen 5-325 MG tablet Commonly known as: NORCO/VICODIN Take 1 tablet by mouth every 6 (six) hours as needed for moderate pain.   HYDROcodone-acetaminophen 5-325 MG tablet Commonly known as: NORCO/VICODIN Take 1 tablet by mouth every 6 (six) hours as needed for moderate pain.   magic mouthwash w/lidocaine Soln Take 5  mLs by mouth 3 (three) times daily as needed for mouth pain. Mixed with equal parts of diphenhydramine Maalox nystatin and viscous lidocaine   montelukast 10 MG tablet Commonly known as: SINGULAIR Take 1 tablet (10 mg total) by mouth at bedtime.   omega-3 acid ethyl esters 1 g capsule Commonly known as: LOVAZA TAKE (1) CAPSULE FOUR TIMES DAILY.   rosuvastatin 20 MG tablet Commonly known as: CRESTOR Take 1 tablet (20 mg total) by mouth daily.   Vitamin D-3 125 MCG (5000 UT) Tabs Take 1 capsule by mouth as directed. Take one capsule by mouth daily Mon thru Friday       History (reviewed): Past Medical History:  Diagnosis Date  . Adenomatous colon polyp   . Allergic drug reaction 06/25/2015  . Allergy   . Anxiety   . Arthritis   . At risk for falls    fallen 4 times recently   . CAD (coronary artery disease)    Dr. Burt Knack follows   . Carpal tunnel syndrome   . Chronic headaches   . Coccydynia 02/01/2012  . Cystocele   . Diverticulosis   . External hemorrhoids   . Focal nodular hyperplasia of liver   . Gait abnormality   . GERD (gastroesophageal reflux disease)    some  . HCAP (healthcare-associated pneumonia) 06/25/2015  . Heart murmur   . HLD (hyperlipidemia)   . HTN (hypertension)   . IBS (irritable bowel syndrome)   . Memory loss   . Neuromuscular disorder ([place])    some neuropathy issues in the past- legs fibromyalgia in past-- past hx of steroid injections  . Obesity   . Osteoporosis   . Otitis of left ear 02/01/2017  . Pneumonia   . Skin cancer    squamous cell - face   Past Surgical History:  Procedure Laterality Date  . CARPAL TUNNEL RELEASE    . COLONOSCOPY  12/15/2008   diverticulosis, external hemorrhoids  . KNEE SURGERY  05/22/2015   right  . LAPAROSCOPY    . SKIN SURGERY     squamous cell - right face    Family History  Problem Relation Age of Onset  . Prostate cancer Father   . Colon cancer Father 97  . Kidney disease Father   . Heart  disease Father   . Alzheimer's disease Father   . Hyperlipidemia Mother   . Hypertension Mother   . Kidney disease Mother   . Osteoporosis Mother   . Heart murmur Mother   . Dementia Mother   . Hyperlipidemia Sister   . Hypertension Sister   . Heart disease Brother 53       not dx questionable   . Heart disease Maternal Grandfather   . Rectal cancer Maternal Grandmother 72  . Colon polyps Neg Hx   . Esophageal cancer Neg Hx   . Stomach cancer Neg Hx    Social History   Socioeconomic History  . Marital status: Married    Spouse name: Shelly Bennett  . Number of children: 4  . Years of education: 16  . Highest education level: Bachelor's degree (e.g., BA, AB, BS)  Occupational History  .  Occupation: Pharmacist, hospital retired  Tobacco Use  . Smoking status: Never Smoker  . Smokeless tobacco: Never Used  Vaping Use  . Vaping Use: Never used  Substance and Sexual Activity  . Alcohol use: No  . Drug use: No  . Sexual activity: Not Currently    Birth control/protection: None  Other Topics Concern  . Not on file  Social History Narrative   Lives at home with her husband.   Right-handed.   1cup coffee each morning. 2-3 small soda cans each day.   Social Determinants of Health   Financial Resource Strain:   . Difficulty of Paying Living Expenses:   Food Insecurity:   . Worried About Charity fundraiser in the Last Year:   . Arboriculturist in the Last Year:   Transportation Needs:   . Film/video editor (Medical):   Marland Kitchen Lack of Transportation (Non-Medical):   Physical Activity:   . Days of Exercise per Week:   . Minutes of Exercise per Session:   Stress:   . Feeling of Stress :   Social Connections:   . Frequency of Communication with Friends and Family:   . Frequency of Social Gatherings with Friends and Family:   . Attends Religious Services:   . Active Member of Clubs or Organizations:   . Attends Archivist Meetings:   Marland Kitchen Marital Status:     Activities of Daily  Living In your present state of health, do you have any difficulty performing the following activities: 06/16/2020  Hearing? N  Vision? N  Difficulty concentrating or making decisions? Y  Comment some memory loss  Walking or climbing stairs? Y  Comment stairs, arthritis in bilateral hips  Dressing or bathing? N  Doing errands, shopping? Y  Comment not currently driving  Preparing Food and eating ? N  Using the Toilet? N  In the past six months, have you accidently leaked urine? Y  Comment urgency incontinence  Do you have problems with loss of bowel control? N  Managing your Medications? N  Managing your Finances? N  Housekeeping or managing your Housekeeping? N  Some recent data might be hidden    Patient Education/ Literacy How often do you need to have someone help you when you read instructions, pamphlets, or other written materials from your doctor or pharmacy?: 1 - Never What is the last grade level you completed in school?: bachelors degree  Exercise Current Exercise Habits: Structured exercise class, Exercise limited by: orthopedic condition(s) (rotator cuff tear, hip arthritis)  Diet Patient reports consuming 3 meals a day and 3 snack(s) a day Patient reports that her primary diet is: Regular Patient reports that she does have regular access to food.   Depression Screen PHQ 2/9 Scores 06/16/2020 01/18/2020 11/23/2019 06/16/2019 06/16/2019 02/05/2019 11/21/2018  PHQ - 2 Score 0 0 3 0 0 4 0  PHQ- 9 Score - - 11 - - 4 -     Fall Risk Fall Risk  06/16/2020 01/18/2020 11/23/2019 10/19/2019 06/16/2019  Falls in the past year? 1 0 0 1 1  Number falls in past yr: 0 - - 1 1  Injury with Fall? 1 - - 0 1  Comment - - - - -  Risk for fall due to : History of fall(s) - - - History of fall(s)  Follow up Falls evaluation completed - - - Education provided     Objective:  Shelly Bennett seemed alert and oriented and she participated appropriately  during our telephone visit.  Blood  Pressure Weight BMI  BP Readings from Last 3 Encounters:  03/10/20 (!) 165/90  02/10/20 (!) 145/82  01/18/20 (!) 141/75   Wt Readings from Last 3 Encounters:  04/27/20 200 lb (90.7 kg)  03/10/20 200 lb (90.7 kg)  03/02/20 195 lb (88.5 kg)   BMI Readings from Last 1 Encounters:  04/27/20 30.41 kg/m    *Unable to obtain current vital signs, weight, and BMI due to telephone visit type  Hearing/Vision  . Dorrene did not seem to have difficulty with hearing/understanding during the telephone conversation . Reports that she has had a formal eye exam by an eye care professional within the past year . Reports that she has not had a formal hearing evaluation within the past year *Unable to fully assess hearing and vision during telephone visit type  Cognitive Function: 6CIT Screen 06/16/2020 06/16/2019  What Year? 0 points 0 points  What month? 3 points 0 points  What time? 0 points 0 points  Count back from 20 0 points 0 points  Months in reverse 0 points 0 points  Repeat phrase 2 points 0 points  Total Score 5 0   (Normal:0-7, Significant for Dysfunction: >8)  Normal Cognitive Function Screening: Yes   Immunization & Health Maintenance Record Immunization History  Administered Date(s) Administered  . Fluad Quad(high Dose 65+) 10/19/2019  . Influenza Whole 11/16/2012  . Influenza, High Dose Seasonal PF 09/15/2016, 09/04/2017, 09/24/2018  . Influenza,inj,Quad PF,6+ Mos 09/15/2013, 10/06/2014, 11/01/2015  . Moderna SARS-COVID-2 Vaccination 05/02/2020  . Pneumococcal Conjugate-13 11/01/2015  . Pneumococcal Polysaccharide-23 09/04/2017  . Tdap 05/01/2017    Health Maintenance  Topic Date Due  . Hepatitis C Screening  05/02/2023 (Originally [birth year])  . INFLUENZA VACCINE  07/17/2020  . COLON CANCER SCREENING ANNUAL FOBT  08/04/2020  . MAMMOGRAM  12/24/2020  . COLONOSCOPY  08/04/2022  . TETANUS/TDAP  05/02/2027  . DEXA SCAN  Completed  . PNA vac Low Risk Adult  Completed    . COVID-19 Vaccine  Discontinued       Assessment  This is a routine wellness examination for Shelly Bennett.  Health Maintenance: Due or Overdue There are no preventive care reminders to display for this patient.  Shelly Bennett does not need a referral for Community Assistance: Care Management:   no Social Work:    no Prescription Assistance:  no Nutrition/Diabetes Education:  no   Plan:  Personalized Goals Goals Addressed            This Visit's Progress   . Patient Stated       06/16/2020 AWV Goal: Fall Prevention  . Over the next year, patient will decrease their risk for falls by: o Using assistive devices, such as a cane or walker, as needed o Identifying fall risks within their home and correcting them by: - Removing throw rugs - Adding handrails to stairs or ramps - Removing clutter and keeping a clear pathway throughout the home - Increasing light, especially at night - Adding shower handles/bars - Raising toilet seat o Identifying potential personal risk factors for falls: - Medication side effects - Incontinence/urgency - Vestibular dysfunction - Hearing loss - Musculoskeletal disorders - Neurological disorders - Orthostatic hypotension        Personalized Health Maintenance & Screening Recommendations    Lung Cancer Screening Recommended: no (Low Dose CT Chest recommended if Age 28-80 years, 30 pack-year currently smoking OR have quit w/in past 15 years) Hepatitis C Screening  recommended: no HIV Screening recommended: no  Advanced Directives: Written information was not prepared per patient's request.  Referrals & Orders No orders of the defined types were placed in this encounter.   Follow-up Plan . Follow-up with Dettinger, Shelly Kaufmann, MD as planned   I have personally reviewed and noted the following in the patient's chart:   . Medical and social history . Use of alcohol, tobacco or illicit drugs  . Current medications  and supplements . Functional ability and status . Nutritional status . Physical activity . Advanced directives . List of other physicians . Hospitalizations, surgeries, and ER visits in previous 12 months . Vitals . Screenings to include cognitive, depression, and falls . Referrals and appointments  In addition, I have reviewed and discussed with Shelly Bennett certain preventive protocols, quality metrics, and best practice recommendations. A written personalized care plan for preventive services as well as general preventive health recommendations is available and can be mailed to the patient at her request.      Baldomero Lamy, LPN  12/23/5535

## 2020-06-21 ENCOUNTER — Other Ambulatory Visit: Payer: Self-pay

## 2020-06-21 ENCOUNTER — Ambulatory Visit: Payer: Medicare PPO | Attending: Orthopaedic Surgery | Admitting: Physical Therapy

## 2020-06-21 DIAGNOSIS — R296 Repeated falls: Secondary | ICD-10-CM | POA: Insufficient documentation

## 2020-06-21 DIAGNOSIS — R2681 Unsteadiness on feet: Secondary | ICD-10-CM | POA: Diagnosis not present

## 2020-06-21 DIAGNOSIS — M25611 Stiffness of right shoulder, not elsewhere classified: Secondary | ICD-10-CM | POA: Diagnosis not present

## 2020-06-21 DIAGNOSIS — M25511 Pain in right shoulder: Secondary | ICD-10-CM | POA: Insufficient documentation

## 2020-06-21 DIAGNOSIS — M6281 Muscle weakness (generalized): Secondary | ICD-10-CM | POA: Diagnosis not present

## 2020-06-21 NOTE — Therapy (Signed)
[facility] [place], [place], [address] Phone: [phone]   Fax:  [phone]  Physical Therapy Treatment  Patient Details  Name: Shelly Bennett MRN: [id number] Date of Birth: 11-08-1951 Referring Provider (PT): Joni Fears, MD   Encounter Date: 06/21/2020   PT End of Session - 06/21/20 1351    Visit Number 12    Number of Visits 18    Date for PT Re-Evaluation 07/18/20    Authorization Type progress note every 10th visit, KX modifier at 15th visit    Authorization Time Period (6 visits used from previous episode of care; in 2021 calendar year)    PT Start Time 0146    PT Stop Time 0240    PT Time Calculation (min) 54 min           Past Medical History:  Diagnosis Date  . Adenomatous colon polyp   . Allergic drug reaction 06/25/2015  . Allergy   . Anxiety   . Arthritis   . At risk for falls    fallen 4 times recently   . CAD (coronary artery disease)    Dr. Burt Knack follows   . Carpal tunnel syndrome   . Chronic headaches   . Coccydynia 02/01/2012  . Cystocele   . Diverticulosis   . External hemorrhoids   . Focal nodular hyperplasia of liver   . Gait abnormality   . GERD (gastroesophageal reflux disease)    some  . HCAP (healthcare-associated pneumonia) 06/25/2015  . Heart murmur   . HLD (hyperlipidemia)   . HTN (hypertension)   . IBS (irritable bowel syndrome)   . Memory loss   . Neuromuscular disorder ([place])    some neuropathy issues in the past- legs fibromyalgia in past-- past hx of steroid injections  . Obesity   . Osteoporosis   . Otitis of left ear 02/01/2017  . Pneumonia   . Skin cancer    squamous cell - face    Past Surgical History:  Procedure Laterality Date  . CARPAL TUNNEL RELEASE    . COLONOSCOPY  12/15/2008   diverticulosis, external hemorrhoids  . KNEE SURGERY  05/22/2015   right  . LAPAROSCOPY    . SKIN SURGERY     squamous cell - right face     There were no  vitals filed for this visit.   Subjective Assessment - 06/21/20 1422    Subjective COVID-19 screen performed prior to patient entering clinic.  Shoulder feels better and is moving better.  Been swimming and that has helped.    Pertinent History HTN, heart murmur, IBS, neuropathy, CAD, osteoporosis, memory loss    Limitations House hold activities;Lifting    Diagnostic tests MRI: RTC tendonopathy/tendonisis, articular surface tearing of supraspinatus tendon, AC joint OA, subacromical/subdelotid bursitis, see imaging    Patient Stated Goals decrease pain, improve arm mobility    Currently in Pain? Yes    Pain Score 2     Pain Onset 1 to 4 weeks ago                             [facility] Adult PT Treatment/Exercise - 06/21/20 0001      Exercises   Exercises Shoulder      Shoulder Exercises: Pulleys   Flexion 5 minutes      Shoulder Exercises: ROM/Strengthening   UBE (Upper Arm Bike) 120 RPM's x 8 minutes (4 minutes forward and 4 minutes backward).  Modalities   Modalities Corporate treasurer Location Right shoulder.    Electrical Stimulation Action IFC    Electrical Stimulation Parameters 80-150 Hz x 20 minutes.    Electrical Stimulation Goals Pain      Ultrasound   Ultrasound Location Right shoulder.    Ultrasound Parameters Combo e'stim/U/S at 1.50 W/CM2 x 10 minutes.    Ultrasound Goals Pain      Vasopneumatic   Number Minutes Vasopneumatic  20 minutes    Vasopnuematic Location  --   Right shoulder.   Vasopneumatic Pressure Low                       PT Long Term Goals - 05/23/20 1420      PT LONG TERM GOAL #1   Title Patient will be independent with HEP    Time 6    Period Weeks    Status On-going      PT LONG TERM GOAL #2   Title Patinet will demonstrate 130+ degrees of right shoulder flexion AROM to improve ability to perform overhead tasks and  dressing activities.    Time 6    Period Weeks    Status On-going      PT LONG TERM GOAL #3   Title Patient will improver right shoulder MMT to 4+/5 or greater to improve stability during functional tasks.    Time 6    Period Weeks    Status On-going      PT LONG TERM GOAL #4   Title Patient will report ability to perform ADLs, home tasks, and gardening with right shoulder pain less than or equal to 3/10    Time 6    Period Days    Status On-going                 Plan - 06/21/20 1426    Clinical Impression Statement Patient making very good progress.  She reports a notable reduction in pain and increased functional usage of her right shoulder.    Personal Factors and Comorbidities Time since onset of injury/illness/exacerbation;Comorbidity 3+;Age;Transportation    Comorbidities HTN, heart murmur, IBS, neuropathy, CAD, osteoporosis, memory loss    Examination-Activity Limitations Dressing;Hygiene/Grooming;Carry;Lift;Reach Overhead;Transfers    Examination-Participation Restrictions Meal Prep;Laundry;Yard Work    Stability/Clinical Decision Making Stable/Uncomplicated    Rehab Potential Good    PT Frequency 2x / week    PT Duration 6 weeks    PT Treatment/Interventions ADLs/Self Care Home Management;Biofeedback;Cryotherapy;Software engineer;Therapeutic activities;Therapeutic exercise;Balance training;Neuromuscular re-education;Patient/family education;Manual techniques;Passive range of motion;Dry needling;Taping;Ultrasound;Iontophoresis 4mg /ml Dexamethasone    PT Next Visit Plan provide HEP and bands; DC    PT Home Exercise Plan see patient education    Consulted and Agree with Plan of Care Patient           Patient will benefit from skilled therapeutic intervention in order to improve the following deficits and impairments:     Visit Diagnosis: Acute pain of right shoulder - Plan: PT plan of care cert/re-cert  Stiffness of right shoulder,  not elsewhere classified - Plan: PT plan of care cert/re-cert  Muscle weakness (generalized) - Plan: PT plan of care cert/re-cert     Problem List Patient Active Problem List   Diagnosis Date Noted  . Spinal stenosis of lumbar region 06/16/2020  . Gait abnormality 03/10/2020  . Chest pain 02/17/2020  . Gastroesophageal reflux disease 10/19/2019  . Hypertensive disorder 10/19/2019  .  Third degree uterine prolapse 10/19/2019  . Spinal stenosis of lumbar region without neurogenic claudication 09/15/2019  . Urinary and fecal incontinence 09/15/2019  . Cystocele with rectocele 09/15/2019  . Radial styloid tenosynovitis (de quervain) 06/24/2019  . Trigger finger, right middle finger 06/24/2019  . Pain in left wrist 05/27/2019  . Trigger thumb, right thumb 10/29/2018  . Memory loss 07/22/2018  . Low back pain 07/22/2018  . Left lumbar radiculopathy 01/22/2018  . Mild cognitive impairment 10/22/2017  . Anxiety 10/22/2017  . Aortic atherosclerosis ([place]) 12/04/2016  . Ptosis of right eyelid 02/01/2016  . Dermatochalasis of both upper eyelids 03/07/2015  . Vitamin D deficiency 08/19/2014  . 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 Family history of colon cancer-father at 64+ grandparent w/ rectal cancer 01/14/2014  . MGD (meibomian gland dysfunction) 10/14/2013  . Injection of surface of eye 10/14/2013  . Osteopenia 08/12/2013  . CAD (coronary artery disease), native coronary artery 03/22/2013  . Aortic valve stenosis, mild 03/22/2013  . Hyperlipemia 03/11/2013  . Hypertension 03/11/2013  . Allergic rhinitis 03/11/2013  . Pain in right shoulder 03/11/2013  . IBS (irritable bowel syndrome) - with chronic recurrent abdominal pain 02/01/2012  . Personal history of colonic polyps - adenoma 02/01/2012    Aunesti Pellegrino, [place] MPT 06/21/2020, 2:40 PM  [facility] [address] [place], [place], [address] Phone:  [phone]   Fax:  [phone]  Name: Lamya Lausch MRN: [id number] Date of Birth: 01-20-1951

## 2020-06-23 NOTE — Progress Notes (Signed)
Provider: [facility] Patient: HOME 

## 2020-06-28 ENCOUNTER — Other Ambulatory Visit: Payer: Self-pay

## 2020-06-28 ENCOUNTER — Ambulatory Visit: Payer: Medicare PPO | Admitting: *Deleted

## 2020-06-28 DIAGNOSIS — M25511 Pain in right shoulder: Secondary | ICD-10-CM | POA: Diagnosis not present

## 2020-06-28 DIAGNOSIS — R2681 Unsteadiness on feet: Secondary | ICD-10-CM | POA: Diagnosis not present

## 2020-06-28 DIAGNOSIS — M25611 Stiffness of right shoulder, not elsewhere classified: Secondary | ICD-10-CM

## 2020-06-28 DIAGNOSIS — R296 Repeated falls: Secondary | ICD-10-CM | POA: Diagnosis not present

## 2020-06-28 DIAGNOSIS — M6281 Muscle weakness (generalized): Secondary | ICD-10-CM | POA: Diagnosis not present

## 2020-06-28 NOTE — Therapy (Signed)
[facility] [place], [place], [address] Phone: [phone]   Fax:  [phone]  Physical Therapy Treatment  Patient Details  Name: Shelly Bennett MRN: [id number] Date of Birth: February 20, 1951 Referring Provider (PT): Joni Fears, MD   Encounter Date: 06/28/2020   PT End of Session - 06/28/20 1352    Visit Number 13    Number of Visits 18    Date for PT Re-Evaluation 07/18/20    Authorization Type progress note every 10th visit, KX modifier at 15th visit    Authorization Time Period (6 visits used from previous episode of care; in 2021 calendar year)    PT Start Time 23           Past Medical History:  Diagnosis Date  . Adenomatous colon polyp   . Allergic drug reaction 06/25/2015  . Allergy   . Anxiety   . Arthritis   . At risk for falls    fallen 4 times recently   . CAD (coronary artery disease)    Dr. Burt Knack follows   . Carpal tunnel syndrome   . Chronic headaches   . Coccydynia 02/01/2012  . Cystocele   . Diverticulosis   . External hemorrhoids   . Focal nodular hyperplasia of liver   . Gait abnormality   . GERD (gastroesophageal reflux disease)    some  . HCAP (healthcare-associated pneumonia) 06/25/2015  . Heart murmur   . HLD (hyperlipidemia)   . HTN (hypertension)   . IBS (irritable bowel syndrome)   . Memory loss   . Neuromuscular disorder ([place])    some neuropathy issues in the past- legs fibromyalgia in past-- past hx of steroid injections  . Obesity   . Osteoporosis   . Otitis of left ear 02/01/2017  . Pneumonia   . Skin cancer    squamous cell - face    Past Surgical History:  Procedure Laterality Date  . CARPAL TUNNEL RELEASE    . COLONOSCOPY  12/15/2008   diverticulosis, external hemorrhoids  . KNEE SURGERY  05/22/2015   right  . LAPAROSCOPY    . SKIN SURGERY     squamous cell - right face     There were no vitals filed for this visit.   Subjective Assessment -  06/28/20 1303    Subjective COVID-19 screen performed prior to patient entering clinic.  Shoulder feels better and is moving better.Did ok after last Rx    Pertinent History HTN, heart murmur, IBS, neuropathy, CAD, osteoporosis, memory loss    Limitations House hold activities;Lifting    Diagnostic tests MRI: RTC tendonopathy/tendonisis, articular surface tearing of supraspinatus tendon, AC joint OA, subacromical/subdelotid bursitis, see imaging    Patient Stated Goals decrease pain, improve arm mobility    Currently in Pain? Yes    Pain Score 2     Pain Location Shoulder    Pain Orientation Right    Pain Descriptors / Indicators Sore    Pain Type Acute pain                             [facility] Adult PT Treatment/Exercise - 06/28/20 0001      Exercises   Exercises Shoulder      Shoulder Exercises: Standing   External Rotation Strengthening;Right;Theraband   2x10-15   Theraband Level (Shoulder External Rotation) Level 1 (Yellow)    Internal Rotation Right;Strengthening   2x 10-15   Theraband Level (Shoulder  Internal Rotation) Level 1 (Yellow)      Shoulder Exercises: Pulleys   Flexion 5 minutes      Shoulder Exercises: ROM/Strengthening   UBE (Upper Arm Bike) --      Modalities   Modalities Electrical Stimulation;Ultrasound;Vasopneumatic      Electrical Stimulation   Electrical Stimulation Location Right shoulder.    Electrical Stimulation Action IFC    Electrical Stimulation Parameters 80-150hz  x 15 mins    Electrical Stimulation Goals Pain      Ultrasound   Ultrasound Location RT shldr    Ultrasound Parameters Combo e/stim  1.5 w/cm2 x 12 mins    Ultrasound Goals Pain      Vasopneumatic   Number Minutes Vasopneumatic  15 minutes    Vasopnuematic Location  Shoulder   Right shoulder.   Vasopneumatic Pressure Low    Vasopneumatic Temperature  34                       PT Long Term Goals - 05/23/20 1420      PT LONG TERM GOAL #1   Title  Patient will be independent with HEP    Time 6    Period Weeks    Status On-going      PT LONG TERM GOAL #2   Title Patinet will demonstrate 130+ degrees of right shoulder flexion AROM to improve ability to perform overhead tasks and dressing activities.    Time 6    Period Weeks    Status On-going      PT LONG TERM GOAL #3   Title Patient will improver right shoulder MMT to 4+/5 or greater to improve stability during functional tasks.    Time 6    Period Weeks    Status On-going      PT LONG TERM GOAL #4   Title Patient will report ability to perform ADLs, home tasks, and gardening with right shoulder pain less than or equal to 3/10    Time 6    Period Days    Status On-going                 Plan - 06/28/20 1307    Clinical Impression Statement Pt arrived today reporting that her RT shldr is doing better with less pain. She was able to perform AAROM as well as light strengthening for ER and IR with yellow t-band. US/ combo was performed  to RT shldr with good results as well as vaso and estim.    Personal Factors and Comorbidities Time since onset of injury/illness/exacerbation;Comorbidity 3+;Age;Transportation    Comorbidities HTN, heart murmur, IBS, neuropathy, CAD, osteoporosis, memory loss    Examination-Activity Limitations Dressing;Hygiene/Grooming;Carry;Lift;Reach Overhead;Transfers    Examination-Participation Restrictions Meal Prep;Laundry;Yard Work    Stability/Clinical Decision Making Stable/Uncomplicated    Rehab Potential Good    PT Frequency 2x / week    PT Duration 6 weeks    PT Treatment/Interventions ADLs/Self Care Home Management;Biofeedback;Cryotherapy;Software engineer;Therapeutic activities;Therapeutic exercise;Balance training;Neuromuscular re-education;Patient/family education;Manual techniques;Passive range of motion;Dry needling;Taping;Ultrasound;Iontophoresis 4mg /ml Dexamethasone    PT Next Visit Plan provide HEP and  bands; DC           Patient will benefit from skilled therapeutic intervention in order to improve the following deficits and impairments:  Pain, Decreased strength, Decreased range of motion, Decreased balance, Impaired UE functional use, Decreased safety awareness, Postural dysfunction  Visit Diagnosis: Acute pain of right shoulder  Stiffness of right shoulder, not elsewhere classified  Problem List Patient Active Problem List   Diagnosis Date Noted  . Spinal stenosis of lumbar region 06/16/2020  . Gait abnormality 03/10/2020  . Chest pain 02/17/2020  . Gastroesophageal reflux disease 10/19/2019  . Hypertensive disorder 10/19/2019  . Third degree uterine prolapse 10/19/2019  . Spinal stenosis of lumbar region without neurogenic claudication 09/15/2019  . Urinary and fecal incontinence 09/15/2019  . Cystocele with rectocele 09/15/2019  . Radial styloid tenosynovitis (de quervain) 06/24/2019  . Trigger finger, right middle finger 06/24/2019  . Pain in left wrist 05/27/2019  . Trigger thumb, right thumb 10/29/2018  . Memory loss 07/22/2018  . Low back pain 07/22/2018  . Left lumbar radiculopathy 01/22/2018  . Mild cognitive impairment 10/22/2017  . Anxiety 10/22/2017  . Aortic atherosclerosis ([place]) 12/04/2016  . Ptosis of right eyelid 02/01/2016  . Dermatochalasis of both upper eyelids 03/07/2015  . Vitamin D deficiency 08/19/2014  . 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 Family history of colon cancer-father at 56+ grandparent w/ rectal cancer 01/14/2014  . MGD (meibomian gland dysfunction) 10/14/2013  . Injection of surface of eye 10/14/2013  . Osteopenia 08/12/2013  . CAD (coronary artery disease), native coronary artery 03/22/2013  . Aortic valve stenosis, mild 03/22/2013  . Hyperlipemia 03/11/2013  . Hypertension 03/11/2013  . Allergic rhinitis 03/11/2013  . Pain in right shoulder 03/11/2013  . IBS (irritable bowel syndrome) -  with chronic recurrent abdominal pain 02/01/2012  . Personal history of colonic polyps - adenoma 02/01/2012    Tor Tsuda,CHRIS, PTA 06/28/2020, 5:26 PM  [facility] [place], [place], [address] Phone: [phone]   Fax:  [phone]  Name: Shelly Bennett MRN: [id number] Date of Birth: August 25, 1951

## 2020-06-29 ENCOUNTER — Other Ambulatory Visit: Payer: Self-pay

## 2020-06-29 ENCOUNTER — Ambulatory Visit: Payer: Medicare PPO | Admitting: Physical Therapy

## 2020-06-29 DIAGNOSIS — M6281 Muscle weakness (generalized): Secondary | ICD-10-CM | POA: Diagnosis not present

## 2020-06-29 DIAGNOSIS — M25611 Stiffness of right shoulder, not elsewhere classified: Secondary | ICD-10-CM | POA: Diagnosis not present

## 2020-06-29 DIAGNOSIS — M25511 Pain in right shoulder: Secondary | ICD-10-CM | POA: Diagnosis not present

## 2020-06-29 DIAGNOSIS — R2681 Unsteadiness on feet: Secondary | ICD-10-CM | POA: Diagnosis not present

## 2020-06-29 DIAGNOSIS — R296 Repeated falls: Secondary | ICD-10-CM | POA: Diagnosis not present

## 2020-06-29 NOTE — Therapy (Signed)
[facility] [place], [place], [address] Phone: [phone]   Fax:  [phone]  Physical Therapy Treatment  Patient Details  Name: Shelly Bennett MRN: [id number] Date of Birth: [birth year] Referring Provider (PT): Joni Fears, MD   Encounter Date: 06/29/2020   PT End of Session - 06/29/20 1151    Visit Number 14    Number of Visits 18    Date for PT Re-Evaluation 07/18/20    Authorization Type progress note every 10th visit, KX modifier at 15th visit    Authorization Time Period (6 visits used from previous episode of care; in 2021 calendar year)    PT Start Time 1030    PT Stop Time 1124    PT Time Calculation (min) 54 min    Activity Tolerance Patient tolerated treatment well    Behavior During Therapy [facility] for tasks assessed/performed           Past Medical History:  Diagnosis Date  . Adenomatous colon polyp   . Allergic drug reaction 06/25/2015  . Allergy   . Anxiety   . Arthritis   . At risk for falls    fallen 4 times recently   . CAD (coronary artery disease)    Dr. Burt Knack follows   . Carpal tunnel syndrome   . Chronic headaches   . Coccydynia 02/01/2012  . Cystocele   . Diverticulosis   . External hemorrhoids   . Focal nodular hyperplasia of liver   . Gait abnormality   . GERD (gastroesophageal reflux disease)    some  . HCAP (healthcare-associated pneumonia) 06/25/2015  . Heart murmur   . HLD (hyperlipidemia)   . HTN (hypertension)   . IBS (irritable bowel syndrome)   . Memory loss   . Neuromuscular disorder ([place])    some neuropathy issues in the past- legs fibromyalgia in past-- past hx of steroid injections  . Obesity   . Osteoporosis   . Otitis of left ear 02/01/2017  . Pneumonia   . Skin cancer    squamous cell - face    Past Surgical History:  Procedure Laterality Date  . CARPAL TUNNEL RELEASE    . COLONOSCOPY  12/15/2008   diverticulosis, external hemorrhoids  . KNEE  SURGERY  05/22/2015   right  . LAPAROSCOPY    . SKIN SURGERY     squamous cell - right face     There were no vitals filed for this visit.   Subjective Assessment - 06/29/20 1144    Subjective COVID-19 screen performed prior to patient entering clinic.  I'm at least 80% better.    Pertinent History HTN, heart murmur, IBS, neuropathy, CAD, osteoporosis, memory loss    Limitations House hold activities;Lifting    Diagnostic tests MRI: RTC tendonopathy/tendonisis, articular surface tearing of supraspinatus tendon, AC joint OA, subacromical/subdelotid bursitis, see imaging    Patient Stated Goals decrease pain, improve arm mobility    Currently in Pain? Yes    Pain Location Shoulder    Pain Orientation Right    Pain Descriptors / Indicators Sore    Pain Type Acute pain                             [facility] Adult PT Treatment/Exercise - 06/29/20 0001      Exercises   Exercises Shoulder      Shoulder Exercises: Standing   Other Standing Exercises RW4 with yellow theraband to fatigue  all directions      Shoulder Exercises: Pulleys   Flexion 5 minutes      Shoulder Exercises: ROM/Strengthening   UBE (Upper Arm Bike) 8 minutes.      Modalities   Modalities Teacher, English as a foreign language Location RT shoulder.    Electrical Stimulation Action IFC    Electrical Stimulation Parameters 80-150 Hz x 20 minutes.    Electrical Stimulation Goals Pain      Manual Therapy   Manual Therapy Soft tissue mobilization    Soft tissue mobilization STW/M x 5 minutes to patient's affected right shoulder to reduce tone.                       PT Long Term Goals - 06/29/20 1151      PT LONG TERM GOAL #1   Title Patient will be independent with HEP    Time 6    Period Weeks    Status Achieved      PT LONG TERM GOAL #2   Title Patinet will demonstrate 130+ degrees of right shoulder flexion AROM to improve ability to  perform overhead tasks and dressing activities.    Time 6    Period Weeks    Status Achieved      PT LONG TERM GOAL #3   Title Patient will improver right shoulder MMT to 4+/5 or greater to improve stability during functional tasks.    Time 6    Period Weeks    Status On-going      PT LONG TERM GOAL #4   Title Patient will report ability to perform ADLs, home tasks, and gardening with right shoulder pain less than or equal to 3/10    Time 6    Period Weeks    Status Achieved                 Plan - 06/29/20 1158    Clinical Impression Statement Patient reporting at least an "80%" improvement.  All but LTG #3.    Personal Factors and Comorbidities Time since onset of injury/illness/exacerbation;Comorbidity 3+;Age;Transportation    Comorbidities HTN, heart murmur, IBS, neuropathy, CAD, osteoporosis, memory loss    Examination-Activity Limitations Dressing;Hygiene/Grooming;Carry;Lift;Reach Overhead;Transfers    Examination-Participation Restrictions Meal Prep;Laundry;Yard Work    Stability/Clinical Decision Making Stable/Uncomplicated    Rehab Potential Good    PT Frequency 2x / week    PT Duration 6 weeks    PT Treatment/Interventions ADLs/Self Care Home Management;Biofeedback;Cryotherapy;Software engineer;Therapeutic activities;Therapeutic exercise;Balance training;Neuromuscular re-education;Patient/family education;Manual techniques;Passive range of motion;Dry needling;Taping;Ultrasound;Iontophoresis 4mg /ml Dexamethasone    PT Next Visit Plan provide HEP and bands; DC    PT Home Exercise Plan see patient education    Consulted and Agree with Plan of Care Patient           Patient will benefit from skilled therapeutic intervention in order to improve the following deficits and impairments:  Pain, Decreased strength, Decreased range of motion, Decreased balance, Impaired UE functional use, Decreased safety awareness, Postural  dysfunction  Visit Diagnosis: Acute pain of right shoulder  Stiffness of right shoulder, not elsewhere classified  Muscle weakness (generalized)     Problem List Patient Active Problem List   Diagnosis Date Noted  . Spinal stenosis of lumbar region 06/16/2020  . Gait abnormality 03/10/2020  . Chest pain 02/17/2020  . Gastroesophageal reflux disease 10/19/2019  . Hypertensive disorder 10/19/2019  . Third degree uterine prolapse 10/19/2019  .  Spinal stenosis of lumbar region without neurogenic claudication 09/15/2019  . Urinary and fecal incontinence 09/15/2019  . Cystocele with rectocele 09/15/2019  . Radial styloid tenosynovitis (de quervain) 06/24/2019  . Trigger finger, right middle finger 06/24/2019  . Pain in left wrist 05/27/2019  . Trigger thumb, right thumb 10/29/2018  . Memory loss 07/22/2018  . Low back pain 07/22/2018  . Left lumbar radiculopathy 01/22/2018  . Mild cognitive impairment 10/22/2017  . Anxiety 10/22/2017  . Aortic atherosclerosis ([place]) 12/04/2016  . Ptosis of right eyelid 02/01/2016  . Dermatochalasis of both upper eyelids 03/07/2015  . Vitamin D deficiency 08/19/2014  . 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 Family history of colon cancer-father at 38+ grandparent w/ rectal cancer 01/14/2014  . MGD (meibomian gland dysfunction) 10/14/2013  . Injection of surface of eye 10/14/2013  . Osteopenia 08/12/2013  . CAD (coronary artery disease), native coronary artery 03/22/2013  . Aortic valve stenosis, mild 03/22/2013  . Hyperlipemia 03/11/2013  . Hypertension 03/11/2013  . Allergic rhinitis 03/11/2013  . Pain in right shoulder 03/11/2013  . IBS (irritable bowel syndrome) - with chronic recurrent abdominal pain 02/01/2012  . Personal history of colonic polyps - adenoma 02/01/2012    Shelly Bennett, Shelly Bennett 06/29/2020, 12:14 PM  [facility] [address] [place],  [place], [address] Phone: [phone]   Fax:  [phone]  Name: Shelly Bennett MRN: [id number] Date of Birth: [birth year]

## 2020-07-04 ENCOUNTER — Other Ambulatory Visit: Payer: Self-pay

## 2020-07-04 ENCOUNTER — Ambulatory Visit: Payer: Medicare PPO | Admitting: Physical Therapy

## 2020-07-04 DIAGNOSIS — M6281 Muscle weakness (generalized): Secondary | ICD-10-CM

## 2020-07-04 DIAGNOSIS — R296 Repeated falls: Secondary | ICD-10-CM | POA: Diagnosis not present

## 2020-07-04 DIAGNOSIS — M25611 Stiffness of right shoulder, not elsewhere classified: Secondary | ICD-10-CM | POA: Diagnosis not present

## 2020-07-04 DIAGNOSIS — M25511 Pain in right shoulder: Secondary | ICD-10-CM

## 2020-07-04 DIAGNOSIS — R2681 Unsteadiness on feet: Secondary | ICD-10-CM | POA: Diagnosis not present

## 2020-07-04 NOTE — Patient Instructions (Signed)
  Strengthening: Resisted Flexion   Hold tubing with right arm at side. Pull forward and up. Move shoulder through pain-free range of motion. Repeat __10__ times per set. Do _2-3___ sets per session. Do _2-3___ sessions per day.  Strengthening: Resisted Extension   Hold tubing in right hand, arm forward. Pull arm back, elbow straight. Repeat __10__ times per set. Do _2-3___ sets per session. Do _2-3___ sessions per day.   Strengthening: Resisted Internal Rotation   Hold tubing in right hand, elbow at side and forearm out. Rotate forearm in across body. Repeat __10__ times per set. Do _2-3___ sets per session. Do _2-3___ sessions per day.   Strengthening: Resisted External Rotation   Hold tubing in right hand, elbow at side and forearm across body. Rotate forearm out. Repeat __10__ times per set. Do __2-3__ sets per session. Do ____ sessions per day.   

## 2020-07-04 NOTE — Therapy (Signed)
[facility] [place], [place], [address] Phone: [phone]   Fax:  [phone]  Physical Therapy Treatment  Patient Details  Name: Shelly Bennett MRN: [id number] Date of Birth: [birth year] Referring Provider (PT): Joni Fears, MD   Encounter Date: 07/04/2020   PT End of Session - 07/04/20 1145    Visit Number 15    Number of Visits 18    Date for PT Re-Evaluation 07/18/20    Authorization Type progress note every 10th visit, KX modifier at 15th visit    PT Start Time 1115    PT Stop Time 1159    PT Time Calculation (min) 44 min    Activity Tolerance Patient tolerated treatment well    Behavior During Therapy [facility] for tasks assessed/performed           Past Medical History:  Diagnosis Date  . Adenomatous colon polyp   . Allergic drug reaction 06/25/2015  . Allergy   . Anxiety   . Arthritis   . At risk for falls    fallen 4 times recently   . CAD (coronary artery disease)    Dr. Burt Knack follows   . Carpal tunnel syndrome   . Chronic headaches   . Coccydynia 02/01/2012  . Cystocele   . Diverticulosis   . External hemorrhoids   . Focal nodular hyperplasia of liver   . Gait abnormality   . GERD (gastroesophageal reflux disease)    some  . HCAP (healthcare-associated pneumonia) 06/25/2015  . Heart murmur   . HLD (hyperlipidemia)   . HTN (hypertension)   . IBS (irritable bowel syndrome)   . Memory loss   . Neuromuscular disorder ([place])    some neuropathy issues in the past- legs fibromyalgia in past-- past hx of steroid injections  . Obesity   . Osteoporosis   . Otitis of left ear 02/01/2017  . Pneumonia   . Skin cancer    squamous cell - face    Past Surgical History:  Procedure Laterality Date  . CARPAL TUNNEL RELEASE    . COLONOSCOPY  12/15/2008   diverticulosis, external hemorrhoids  . KNEE SURGERY  05/22/2015   right  . LAPAROSCOPY    . SKIN SURGERY     squamous cell - right face      There were no vitals filed for this visit.   Subjective Assessment - 07/04/20 1118    Subjective COVID-19 screen performed prior to patient entering clinic. Patient arrived with less discomfort overall    Pertinent History HTN, heart murmur, IBS, neuropathy, CAD, osteoporosis, memory loss    Limitations House hold activities;Lifting    Diagnostic tests MRI: RTC tendonopathy/tendonisis, articular surface tearing of supraspinatus tendon, AC joint OA, subacromical/subdelotid bursitis, see imaging    Patient Stated Goals decrease pain, improve arm mobility    Currently in Pain? Yes    Pain Score 2     Pain Location Shoulder    Pain Orientation Right    Pain Descriptors / Indicators Discomfort;Sore    Pain Type Acute pain    Pain Onset More than a month ago    Pain Frequency Intermittent    Aggravating Factors  increased activity    Pain Relieving Factors at rest/meds                             [facility] Adult PT Treatment/Exercise - 07/04/20 0001      Shoulder Exercises: Prone  Other Prone Exercises kneeling 1# for rows and ext 3x10 each      Shoulder Exercises: Standing   Protraction Strengthening;Right;20 reps;10 reps;Theraband    Theraband Level (Shoulder Protraction) Level 1 (Yellow)    External Rotation Strengthening;Right;20 reps;10 reps;Theraband    Theraband Level (Shoulder External Rotation) Level 1 (Yellow)    Internal Rotation Right;Strengthening;20 reps;10 reps;Theraband    Theraband Level (Shoulder Internal Rotation) Level 1 (Yellow)    Retraction Strengthening;Right;20 reps;10 reps;Theraband    Theraband Level (Shoulder Retraction) Level 1 (Yellow)    Other Standing Exercises wall slide with eccentric lowering x fatigue    Other Standing Exercises 1# flexion and scaption 2x10 each      Shoulder Exercises: Pulleys   Flexion 5 minutes      Electrical Stimulation   Electrical Stimulation Location RT shoulder.    Chartered certified accountant IFC     Electrical Stimulation Parameters 80-150hz  x58min    Electrical Stimulation Goals Pain      Vasopneumatic   Number Minutes Vasopneumatic  15 minutes    Vasopnuematic Location  Shoulder    Vasopneumatic Pressure Low    Vasopneumatic Temperature  34                  PT Education - 07/04/20 1151    Education Details HEP with yellow and red band    Person(s) Educated Patient    Methods Explanation;Demonstration;Handout    Comprehension Verbalized understanding;Returned demonstration               PT Long Term Goals - 07/04/20 1146      PT LONG TERM GOAL #1   Title Patient will be independent with HEP    Time 6    Period Weeks    Status Achieved      PT LONG TERM GOAL #2   Title Patinet will demonstrate 130+ degrees of right shoulder flexion AROM to improve ability to perform overhead tasks and dressing activities.    Time 6    Period Weeks    Status Achieved      PT LONG TERM GOAL #3   Title Patient will improver right shoulder MMT to 4+/5 or greater to improve stability during functional tasks.    Time 6    Period Weeks    Status On-going      PT LONG TERM GOAL #4   Title Patient will report ability to perform ADLs, home tasks, and gardening with right shoulder pain less than or equal to 3/10    Time 6    Period Weeks    Status Achieved                 Plan - 07/04/20 1147    Clinical Impression Statement Patient tolerated treatment well today. Patient able to progress with PRE's with no increased discomfort, only required ongoing verbal education for correct technique. Patient able to perom ADL's with greater ease overall. HEP provided for RW4 exercises.Remaining goal progressing.    Personal Factors and Comorbidities Time since onset of injury/illness/exacerbation;Comorbidity 3+;Age;Transportation    Comorbidities HTN, heart murmur, IBS, neuropathy, CAD, osteoporosis, memory loss    Examination-Activity Limitations  Dressing;Hygiene/Grooming;Carry;Lift;Reach Overhead;Transfers    Examination-Participation Restrictions Meal Prep;Laundry;Yard Work    Stability/Clinical Decision Making Stable/Uncomplicated    Rehab Potential Good    PT Frequency 2x / week    PT Duration 6 weeks    PT Treatment/Interventions ADLs/Self Care Home Management;Biofeedback;Cryotherapy;Software engineer;Therapeutic activities;Therapeutic exercise;Balance training;Neuromuscular re-education;Patient/family education;Manual techniques;Passive range  of motion;Dry needling;Taping;Ultrasound;Iontophoresis 4mg /ml Dexamethasone    PT Next Visit Plan cont PRE's and DC (continue with verbal education for technique.)    Consulted and Agree with Plan of Care Patient           Patient will benefit from skilled therapeutic intervention in order to improve the following deficits and impairments:  Pain, Decreased strength, Decreased range of motion, Decreased balance, Impaired UE functional use, Decreased safety awareness, Postural dysfunction  Visit Diagnosis: Acute pain of right shoulder  Stiffness of right shoulder, not elsewhere classified  Muscle weakness (generalized)     Problem List Patient Active Problem List   Diagnosis Date Noted  . Spinal stenosis of lumbar region 06/16/2020  . Gait abnormality 03/10/2020  . Chest pain 02/17/2020  . Gastroesophageal reflux disease 10/19/2019  . Hypertensive disorder 10/19/2019  . Third degree uterine prolapse 10/19/2019  . Spinal stenosis of lumbar region without neurogenic claudication 09/15/2019  . Urinary and fecal incontinence 09/15/2019  . Cystocele with rectocele 09/15/2019  . Radial styloid tenosynovitis (de quervain) 06/24/2019  . Trigger finger, right middle finger 06/24/2019  . Pain in left wrist 05/27/2019  . Trigger thumb, right thumb 10/29/2018  . Memory loss 07/22/2018  . Low back pain 07/22/2018  . Left lumbar radiculopathy 01/22/2018  .  Mild cognitive impairment 10/22/2017  . Anxiety 10/22/2017  . Aortic atherosclerosis ([place]) 12/04/2016  . Ptosis of right eyelid 02/01/2016  . Dermatochalasis of both upper eyelids 03/07/2015  . Vitamin D deficiency 08/19/2014  . 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 Family history of colon cancer-father at 5+ grandparent w/ rectal cancer 01/14/2014  . MGD (meibomian gland dysfunction) 10/14/2013  . Injection of surface of eye 10/14/2013  . Osteopenia 08/12/2013  . CAD (coronary artery disease), native coronary artery 03/22/2013  . Aortic valve stenosis, mild 03/22/2013  . Hyperlipemia 03/11/2013  . Hypertension 03/11/2013  . Allergic rhinitis 03/11/2013  . Pain in right shoulder 03/11/2013  . IBS (irritable bowel syndrome) - with chronic recurrent abdominal pain 02/01/2012  . Personal history of colonic polyps - adenoma 02/01/2012    Phillips Climes, PTA 07/04/2020, 12:01 PM  [facility] [address] [place], [place], [address] Phone: [phone]   Fax:  [phone]  Name: Shelly Bennett MRN: [id number] Date of Birth: 1951/09/14

## 2020-07-07 ENCOUNTER — Other Ambulatory Visit: Payer: Self-pay

## 2020-07-07 ENCOUNTER — Ambulatory Visit: Payer: Medicare PPO | Admitting: Physical Therapy

## 2020-07-07 DIAGNOSIS — M6281 Muscle weakness (generalized): Secondary | ICD-10-CM

## 2020-07-07 DIAGNOSIS — M25611 Stiffness of right shoulder, not elsewhere classified: Secondary | ICD-10-CM | POA: Diagnosis not present

## 2020-07-07 DIAGNOSIS — R2681 Unsteadiness on feet: Secondary | ICD-10-CM | POA: Diagnosis not present

## 2020-07-07 DIAGNOSIS — M25511 Pain in right shoulder: Secondary | ICD-10-CM | POA: Diagnosis not present

## 2020-07-07 DIAGNOSIS — R296 Repeated falls: Secondary | ICD-10-CM | POA: Diagnosis not present

## 2020-07-07 NOTE — Therapy (Signed)
[facility] [place], [place], [address] Phone: [phone]   Fax:  [phone]  Physical Therapy Treatment  Patient Details  Name: Shelly Bennett MRN: [id number] Date of Birth: [birth year] Referring Provider (PT): Joni Fears, MD   Encounter Date: 07/07/2020   PT End of Session - 07/07/20 1059    Visit Number 16    Number of Visits 18    Date for PT Re-Evaluation 07/18/20    Authorization Type progress note every 10th visit, KX modifier at 15th visit    Authorization Time Period (6 visits used from previous episode of care; in 2021 calendar year)    PT Start Time 1031    PT Stop Time 1111    PT Time Calculation (min) 40 min    Activity Tolerance Patient tolerated treatment well    Behavior During Therapy [facility] for tasks assessed/performed           Past Medical History:  Diagnosis Date  . Adenomatous colon polyp   . Allergic drug reaction 06/25/2015  . Allergy   . Anxiety   . Arthritis   . At risk for falls    fallen 4 times recently   . CAD (coronary artery disease)    Dr. Burt Knack follows   . Carpal tunnel syndrome   . Chronic headaches   . Coccydynia 02/01/2012  . Cystocele   . Diverticulosis   . External hemorrhoids   . Focal nodular hyperplasia of liver   . Gait abnormality   . GERD (gastroesophageal reflux disease)    some  . HCAP (healthcare-associated pneumonia) 06/25/2015  . Heart murmur   . HLD (hyperlipidemia)   . HTN (hypertension)   . IBS (irritable bowel syndrome)   . Memory loss   . Neuromuscular disorder ([place])    some neuropathy issues in the past- legs fibromyalgia in past-- past hx of steroid injections  . Obesity   . Osteoporosis   . Otitis of left ear 02/01/2017  . Pneumonia   . Skin cancer    squamous cell - face    Past Surgical History:  Procedure Laterality Date  . CARPAL TUNNEL RELEASE    . COLONOSCOPY  12/15/2008   diverticulosis, external hemorrhoids  . KNEE  SURGERY  05/22/2015   right  . LAPAROSCOPY    . SKIN SURGERY     squamous cell - right face     There were no vitals filed for this visit.   Subjective Assessment - 07/07/20 1050    Subjective COVID-19 screen performed prior to patient entering clinic.  Sore today but much better since starting therapy.    Pertinent History HTN, heart murmur, IBS, neuropathy, CAD, osteoporosis, memory loss    Limitations House hold activities;Lifting    Diagnostic tests MRI: RTC tendonopathy/tendonisis, articular surface tearing of supraspinatus tendon, AC joint OA, subacromical/subdelotid bursitis, see imaging    Patient Stated Goals decrease pain, improve arm mobility    Currently in Pain? Yes    Pain Score 3     Pain Location Shoulder    Pain Orientation Right    Pain Descriptors / Indicators Discomfort;Sore    Pain Type Acute pain    Pain Onset More than a month ago                             [facility] Adult PT Treatment/Exercise - 07/07/20 0001      Shoulder Exercises: Standing  Other Standing Exercises RW4 to fatigue all motions wiht yellow theraband.      Shoulder Exercises: Body Blade   Flexion Limitations 5 minutes.      Modalities   Modalities Health visitor Stimulation Location Right shoulder.    Electrical Stimulation Action IFC    Electrical Stimulation Parameters 80-150 Hz x 20 minutes.    Electrical Stimulation Goals Pain      Vasopneumatic   Number Minutes Vasopneumatic  20 minutes    Vasopnuematic Location  --   Right shoulder.   Vasopneumatic Pressure Low                       PT Long Term Goals - 07/04/20 1146      PT LONG TERM GOAL #1   Title Patient will be independent with HEP    Time 6    Period Weeks    Status Achieved      PT LONG TERM GOAL #2   Title Patinet will demonstrate 130+ degrees of right shoulder flexion AROM to improve ability to perform overhead  tasks and dressing activities.    Time 6    Period Weeks    Status Achieved      PT LONG TERM GOAL #3   Title Patient will improver right shoulder MMT to 4+/5 or greater to improve stability during functional tasks.    Time 6    Period Weeks    Status On-going      PT LONG TERM GOAL #4   Title Patient will report ability to perform ADLs, home tasks, and gardening with right shoulder pain less than or equal to 3/10    Time 6    Period Weeks    Status Achieved                 Plan - 07/07/20 1100    Clinical Impression Statement Patient has made excellent progress.  Her right shoulder range of motionis essentially full now.  Much less pain but with some remaining right shoulder soreness.    Personal Factors and Comorbidities Time since onset of injury/illness/exacerbation;Comorbidity 3+;Age;Transportation    Comorbidities HTN, heart murmur, IBS, neuropathy, CAD, osteoporosis, memory loss    Examination-Activity Limitations Dressing;Hygiene/Grooming;Carry;Lift;Reach Overhead;Transfers    Examination-Participation Restrictions Meal Prep;Laundry;Yard Work    Stability/Clinical Decision Making Stable/Uncomplicated    Rehab Potential Good    PT Frequency 2x / week    PT Duration 6 weeks    PT Treatment/Interventions ADLs/Self Care Home Management;Biofeedback;Cryotherapy;Software engineer;Therapeutic activities;Therapeutic exercise;Balance training;Neuromuscular re-education;Patient/family education;Manual techniques;Passive range of motion;Dry needling;Taping;Ultrasound;Iontophoresis 4mg /ml Dexamethasone    PT Next Visit Plan cont PRE's and DC (continue with verbal education for technique.)    PT Home Exercise Plan see patient education    Consulted and Agree with Plan of Care Patient           Patient will benefit from skilled therapeutic intervention in order to improve the following deficits and impairments:  Pain, Decreased strength, Decreased  range of motion, Decreased balance, Impaired UE functional use, Decreased safety awareness, Postural dysfunction  Visit Diagnosis: Acute pain of right shoulder  Stiffness of right shoulder, not elsewhere classified  Muscle weakness (generalized)     Problem List Patient Active Problem List   Diagnosis Date Noted  . Spinal stenosis of lumbar region 06/16/2020  . Gait abnormality 03/10/2020  . Chest pain 02/17/2020  . Gastroesophageal reflux disease 10/19/2019  .  Hypertensive disorder 10/19/2019  . Third degree uterine prolapse 10/19/2019  . Spinal stenosis of lumbar region without neurogenic claudication 09/15/2019  . Urinary and fecal incontinence 09/15/2019  . Cystocele with rectocele 09/15/2019  . Radial styloid tenosynovitis (de quervain) 06/24/2019  . Trigger finger, right middle finger 06/24/2019  . Pain in left wrist 05/27/2019  . Trigger thumb, right thumb 10/29/2018  . Memory loss 07/22/2018  . Low back pain 07/22/2018  . Left lumbar radiculopathy 01/22/2018  . Mild cognitive impairment 10/22/2017  . Anxiety 10/22/2017  . Aortic atherosclerosis ([place]) 12/04/2016  . Ptosis of right eyelid 02/01/2016  . Dermatochalasis of both upper eyelids 03/07/2015  . Vitamin D deficiency 08/19/2014  . 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 Family history of colon cancer-father at 76+ grandparent w/ rectal cancer 01/14/2014  . MGD (meibomian gland dysfunction) 10/14/2013  . Injection of surface of eye 10/14/2013  . Osteopenia 08/12/2013  . CAD (coronary artery disease), native coronary artery 03/22/2013  . Aortic valve stenosis, mild 03/22/2013  . Hyperlipemia 03/11/2013  . Hypertension 03/11/2013  . Allergic rhinitis 03/11/2013  . Pain in right shoulder 03/11/2013  . IBS (irritable bowel syndrome) - with chronic recurrent abdominal pain 02/01/2012  . Personal history of colonic polyps - adenoma 02/01/2012    Shelly Bennett, [place] MPT 07/07/2020,  11:11 AM  [facility] [address] [place], [place], [address] Phone: [phone]   Fax:  [phone]  Name: Shelly Bennett MRN: [id number] Date of Birth: 09-May-1951

## 2020-07-13 ENCOUNTER — Other Ambulatory Visit: Payer: Self-pay

## 2020-07-13 ENCOUNTER — Encounter: Payer: Self-pay | Admitting: Physical Therapy

## 2020-07-13 ENCOUNTER — Ambulatory Visit: Payer: Medicare PPO | Admitting: Physical Therapy

## 2020-07-13 DIAGNOSIS — M6281 Muscle weakness (generalized): Secondary | ICD-10-CM

## 2020-07-13 DIAGNOSIS — M25511 Pain in right shoulder: Secondary | ICD-10-CM

## 2020-07-13 DIAGNOSIS — R296 Repeated falls: Secondary | ICD-10-CM | POA: Diagnosis not present

## 2020-07-13 DIAGNOSIS — M25611 Stiffness of right shoulder, not elsewhere classified: Secondary | ICD-10-CM

## 2020-07-13 DIAGNOSIS — R2681 Unsteadiness on feet: Secondary | ICD-10-CM

## 2020-07-13 NOTE — Patient Instructions (Signed)
Access Code: [email or web address] URL: [email or web address] Date: 07/13/2020 Prepared by: Kearney Hard  Exercises Doorway Pec Stretch at 90 Degrees Abduction - 1-2 x daily - 7 x weekly - 5 reps - 10 seconds hold Doorway Pec Stretch at 120 Degrees Abduction - 1-2 x daily - 7 x weekly - 5 reps - 10 seconds hold Shoulder Flexion Wall Slide with Towel - 1-2 x daily - 7 x weekly - 5 reps - 10 seconds hold Standing Shoulder Abduction Slides at Wall - 1-2 x daily - 7 x weekly - 5 reps - 10 seocnds hold Standing Wall [employer] with Mini Swiss Ball - 1-2 x daily - 7 x weekly - 20 reps

## 2020-07-13 NOTE — Therapy (Signed)
[facility] [address] [place], [place], [address] Phone: [phone]   Fax:  [phone]  Physical Therapy Treatment  Patient Details  Name: Rilee Knoll MRN: [id number] Date of Birth: 11-17-1951 Referring Provider (PT): Norlene Campbell, MD   Encounter Date: 07/13/2020   PT End of Session - 07/13/20 1041    Visit Number 17    Number of Visits 18    Date for PT Re-Evaluation 07/18/20    Authorization Type progress note every 10th visit, KX modifier at 15th visit    Authorization Time Period (6 visits used from previous episode of care; in 2021 calendar year)    PT Start Time 1037    PT Stop Time 1125    PT Time Calculation (min) 48 min    Equipment Utilized During Treatment Other (comment)    Activity Tolerance Patient tolerated treatment well           Past Medical History:  Diagnosis Date  . Adenomatous colon polyp   . Allergic drug reaction 06/25/2015  . Allergy   . Anxiety   . Arthritis   . At risk for falls    fallen 4 times recently   . CAD (coronary artery disease)    Dr. Excell Seltzer follows   . Carpal tunnel syndrome   . Chronic headaches   . Coccydynia 02/01/2012  . Cystocele   . Diverticulosis   . External hemorrhoids   . Focal nodular hyperplasia of liver   . Gait abnormality   . GERD (gastroesophageal reflux disease)    some  . HCAP (healthcare-associated pneumonia) 06/25/2015  . Heart murmur   . HLD (hyperlipidemia)   . HTN (hypertension)   . IBS (irritable bowel syndrome)   . Memory loss   . Neuromuscular disorder (HCC)    some neuropathy issues in the past- legs fibromyalgia in past-- past hx of steroid injections  . Obesity   . Osteoporosis   . Otitis of left ear 02/01/2017  . Pneumonia   . Skin cancer    squamous cell - face    Past Surgical History:  Procedure Laterality Date  . CARPAL TUNNEL RELEASE    . COLONOSCOPY  12/15/2008   diverticulosis, external hemorrhoids  . KNEE SURGERY   05/22/2015   right  . LAPAROSCOPY    . SKIN SURGERY     squamous cell - right face     There were no vitals filed for this visit.   Subjective Assessment - 07/13/20 1039    Subjective COVID-19 screen performed prior to patient entering clinic.  Pt reporting soreness and pointing to her posterior shoulder. Pain reported at 3/10.    Pertinent History HTN, heart murmur, IBS, neuropathy, CAD, osteoporosis, memory loss    Limitations House hold activities;Lifting    Diagnostic tests MRI: RTC tendonopathy/tendonisis, articular surface tearing of supraspinatus tendon, AC joint OA, subacromical/subdelotid bursitis, see imaging    Patient Stated Goals decrease pain, improve arm mobility    Currently in Pain? Yes    Pain Score 3     Pain Location Shoulder    Pain Orientation Right    Pain Descriptors / Indicators Sore    Pain Type Acute pain    Pain Onset More than a month ago              [facility] PT Assessment - 07/13/20 0001      Assessment   Medical Diagnosis Chronic right shoulder pain    Referring Provider (PT) Theron Arista  Durward Fortes, MD    Next MD Visit June 2021    Prior Therapy not for shoulder      Precautions   Precautions Bernerd Limbo Adult PT Treatment/Exercise - 07/13/20 0001      Shoulder Exercises: Standing   Protraction Strengthening;Right;Theraband    Theraband Level (Shoulder Protraction) Level 1 (Yellow)    Shoulder Elevation Limitations chest press x 15 using yellow theraband    Other Standing Exercises RW4 to fatigue all motions wiht yellow theraband.      Shoulder Exercises: Pulleys   Flexion 3 minutes    ABduction 2 minutes      Vasopneumatic   Number Minutes Vasopneumatic  10 minutes    Vasopnuematic Location  Shoulder    Vasopneumatic Pressure Low    Vasopneumatic Temperature  34                  PT Education - 07/13/20 1108    Education Details HEP    Person(s) Educated Patient    Methods  Explanation;Demonstration    Comprehension Verbalized understanding               PT Long Term Goals - 07/13/20 1107      PT LONG TERM GOAL #1   Title Patient will be independent with HEP    Status Achieved      PT LONG TERM GOAL #2   Status Achieved      PT LONG TERM GOAL #3   Baseline grossly 4+/5    Status Achieved      PT LONG TERM GOAL #4   Baseline pt reporting pain of 3 or less.    Status Achieved                 Plan - 07/13/20 1041    Clinical Impression Statement Pt arriving 7 minutes later to today's appointment. Pt tolerating all exericses well progressing toward more painfree ranges and improvements in functional mobility. Pt has met all her LTG's.  We discussed discharging pt today for her shoulder. Pt feels she needs to work on her balance more than her shoulder. We discussed discharging today and pt going home with continued progressing of her R shoulder exercises and then follow up with her PCP for new referral for balance.    Personal Factors and Comorbidities Time since onset of injury/illness/exacerbation;Comorbidity 3+;Age;Transportation    Comorbidities HTN, heart murmur, IBS, neuropathy, CAD, osteoporosis, memory loss    Examination-Activity Limitations Dressing;Hygiene/Grooming;Carry;Lift;Reach Overhead;Transfers    Examination-Participation Restrictions Meal Prep;Laundry;Yard Work    Stability/Clinical Decision Making Stable/Uncomplicated    Rehab Potential Good    PT Frequency 2x / week    PT Duration 6 weeks    PT Treatment/Interventions ADLs/Self Care Home Management;Biofeedback;Cryotherapy;Software engineer;Therapeutic activities;Therapeutic exercise;Balance training;Neuromuscular re-education;Patient/family education;Manual techniques;Passive range of motion;Dry needling;Taping;Ultrasound;Iontophoresis 68m/ml Dexamethasone    PT Next Visit Plan Discharge today with HEP    PT Home Exercise Plan flexion,  extension, ER, IR with yellow and red bands, rows, wall slides, cane flexion and ER stretching    Consulted and Agree with Plan of Care Patient           Patient will benefit from skilled therapeutic intervention in order to improve the following deficits and impairments:  Pain, Decreased strength, Decreased range of motion, Decreased balance, Impaired UE functional use, Decreased safety awareness, Postural  dysfunction  Visit Diagnosis: Acute pain of right shoulder  Stiffness of right shoulder, not elsewhere classified  Muscle weakness (generalized)  Repeated falls  Unsteadiness on feet     Problem List Patient Active Problem List   Diagnosis Date Noted  . Spinal stenosis of lumbar region 06/16/2020  . Gait abnormality 03/10/2020  . Chest pain 02/17/2020  . Gastroesophageal reflux disease 10/19/2019  . Hypertensive disorder 10/19/2019  . Third degree uterine prolapse 10/19/2019  . Spinal stenosis of lumbar region without neurogenic claudication 09/15/2019  . Urinary and fecal incontinence 09/15/2019  . Cystocele with rectocele 09/15/2019  . Radial styloid tenosynovitis (de quervain) 06/24/2019  . Trigger finger, right middle finger 06/24/2019  . Pain in left wrist 05/27/2019  . Trigger thumb, right thumb 10/29/2018  . Memory loss 07/22/2018  . Low back pain 07/22/2018  . Left lumbar radiculopathy 01/22/2018  . Mild cognitive impairment 10/22/2017  . Anxiety 10/22/2017  . Aortic atherosclerosis ([place]) 12/04/2016  . Ptosis of right eyelid 02/01/2016  . Dermatochalasis of both upper eyelids 03/07/2015  . Vitamin D deficiency 08/19/2014  . 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 Family history of colon cancer-father at 5+ grandparent w/ rectal cancer 01/14/2014  . MGD (meibomian gland dysfunction) 10/14/2013  . Injection of surface of eye 10/14/2013  . Osteopenia 08/12/2013  . CAD (coronary artery disease), native coronary artery  03/22/2013  . Aortic valve stenosis, mild 03/22/2013  . Hyperlipemia 03/11/2013  . Hypertension 03/11/2013  . Allergic rhinitis 03/11/2013  . Pain in right shoulder 03/11/2013  . IBS (irritable bowel syndrome) - with chronic recurrent abdominal pain 02/01/2012  . Personal history of colonic polyps - adenoma 02/01/2012    Oretha Caprice, PT, MPT 07/13/2020, 11:16 AM  [facility] [address] [place], [place], [address] Phone: [phone]   Fax:  [phone]  Name: Pinky Ravan MRN: [id number] Date of Birth: 01-08-1951

## 2020-07-19 ENCOUNTER — Ambulatory Visit: Payer: Medicare PPO | Admitting: Physical Therapy

## 2020-07-25 DIAGNOSIS — L821 Other seborrheic keratosis: Secondary | ICD-10-CM | POA: Diagnosis not present

## 2020-07-25 DIAGNOSIS — L720 Epidermal cyst: Secondary | ICD-10-CM | POA: Diagnosis not present

## 2020-07-25 DIAGNOSIS — D1801 Hemangioma of skin and subcutaneous tissue: Secondary | ICD-10-CM | POA: Diagnosis not present

## 2020-07-25 DIAGNOSIS — Z85828 Personal history of other malignant neoplasm of skin: Secondary | ICD-10-CM | POA: Diagnosis not present

## 2020-07-25 DIAGNOSIS — L814 Other melanin hyperpigmentation: Secondary | ICD-10-CM | POA: Diagnosis not present

## 2020-07-25 DIAGNOSIS — L570 Actinic keratosis: Secondary | ICD-10-CM | POA: Diagnosis not present

## 2020-07-25 DIAGNOSIS — D485 Neoplasm of uncertain behavior of skin: Secondary | ICD-10-CM | POA: Diagnosis not present

## 2020-07-25 DIAGNOSIS — L905 Scar conditions and fibrosis of skin: Secondary | ICD-10-CM | POA: Diagnosis not present

## 2020-08-09 ENCOUNTER — Telehealth (HOSPITAL_COMMUNITY): Payer: Self-pay | Admitting: Radiology

## 2020-08-09 NOTE — Telephone Encounter (Signed)
Called patient to schedule echocardiogram. Patient was not aware of appointment with Dr. Burt Knack on 08/24/20. Will need to reschedule appointment. Please call.

## 2020-08-09 NOTE — Telephone Encounter (Signed)
Patient said to disregard message. She will keep appt.

## 2020-08-23 ENCOUNTER — Other Ambulatory Visit: Payer: Self-pay | Admitting: Family Medicine

## 2020-08-24 ENCOUNTER — Encounter: Payer: Self-pay | Admitting: Cardiovascular Disease

## 2020-08-24 ENCOUNTER — Other Ambulatory Visit: Payer: Self-pay

## 2020-08-24 ENCOUNTER — Ambulatory Visit: Payer: Medicare PPO | Admitting: Cardiovascular Disease

## 2020-08-24 VITALS — BP 136/82 | HR 62 | Ht 68.0 in | Wt 198.6 lb

## 2020-08-24 DIAGNOSIS — I10 Essential (primary) hypertension: Secondary | ICD-10-CM

## 2020-08-24 DIAGNOSIS — I2510 Atherosclerotic heart disease of native coronary artery without angina pectoris: Secondary | ICD-10-CM | POA: Diagnosis not present

## 2020-08-24 DIAGNOSIS — Q231 Congenital insufficiency of aortic valve: Secondary | ICD-10-CM

## 2020-08-24 DIAGNOSIS — E782 Mixed hyperlipidemia: Secondary | ICD-10-CM

## 2020-08-24 NOTE — Patient Instructions (Signed)
Medication Instructions:  Your provider recommends that you continue on your current medications as directed. Please refer to the Current Medication list given to you today.   *If you need a refill on your cardiac medications before your next appointment, please call your pharmacy*  Testing/Procedures: Your echo 9/24 has been cancelled.   Follow-Up: You will be called to arrange your 1 year echo and office visit.

## 2020-08-24 NOTE — Progress Notes (Signed)
Cardiology Office Note:    Date:  08/24/2020   ID:  Shelly Bennett, DOB June 15, 1951, MRN [id number]  PCP:  Dettinger, Fransisca Kaufmann, MD  [facility] Cardiologist:  Sherren Mocha, MD  [place] Electrophysiologist:  None   Referring MD: Dettinger, Fransisca Kaufmann, MD   Chief Complaint  Patient presents with  . Shortness of Breath    History of Present Illness:    Shelly Bennett is a 69 y.o. female with a hx of nonobstructive coronary artery disease and bicuspid aortic valve, presenting for follow-up evaluation.  The patient is here with her husband today who is also a patient.  She continues to have problems with reduced functional capacity related to her back as well as some balance problems.  She ambulates with a cane when she is out.  She had a fall a few months ago and tore her right rotator cuff.  She did not require surgery and has done physical therapy for this.  She denies chest pain, chest pressure, or shortness of breath.  She has had occasional lightheadedness but this seems more like an issue with her balance.  Denies leg swelling, orthopnea, or PND.  Past Medical History:  Diagnosis Date  . Adenomatous colon polyp   . Allergic drug reaction 06/25/2015  . Allergy   . Anxiety   . Arthritis   . At risk for falls    fallen 4 times recently   . CAD (coronary artery disease)    Dr. Burt Knack follows   . Carpal tunnel syndrome   . Chronic headaches   . Coccydynia 02/01/2012  . Cystocele   . Diverticulosis   . External hemorrhoids   . Focal nodular hyperplasia of liver   . Gait abnormality   . GERD (gastroesophageal reflux disease)    some  . HCAP (healthcare-associated pneumonia) 06/25/2015  . Heart murmur   . HLD (hyperlipidemia)   . HTN (hypertension)   . IBS (irritable bowel syndrome)   . Memory loss   . Neuromuscular disorder ([place])    some neuropathy issues in the past- legs fibromyalgia in past-- past hx of steroid injections  . Obesity   . Osteoporosis    . Otitis of left ear 02/01/2017  . Pneumonia   . Skin cancer    squamous cell - face    Past Surgical History:  Procedure Laterality Date  . CARPAL TUNNEL RELEASE    . COLONOSCOPY  12/15/2008   diverticulosis, external hemorrhoids  . KNEE SURGERY  05/22/2015   right  . LAPAROSCOPY    . SKIN SURGERY     squamous cell - right face     Current Medications: Current Meds  Medication Sig  . amLODipine (NORVASC) 10 MG tablet TAKE (1/2) TABLET DAILY.  Marland Kitchen aspirin 81 MG tablet Take 81 mg by mouth daily.   Marland Kitchen buPROPion (WELLBUTRIN XL) 150 MG 24 hr tablet Take 1 tablet (150 mg total) by mouth daily.  . Calcium Carbonate (CALCARB 600 PO) Take by mouth.  . Cholecalciferol (VITAMIN D-3) 5000 UNITS TABS Take 1 capsule by mouth as directed. Take one capsule by mouth daily Mon thru Friday  . donepezil (ARICEPT) 10 MG tablet Take 10 mg by mouth at bedtime. Per patient taking 1/2 tablet  . fexofenadine (ALLEGRA) 180 MG tablet Take 180 mg by mouth daily.  . fluticasone (FLONASE) 50 MCG/ACT nasal spray Place 2 sprays into both nostrils daily.  . hydrochlorothiazide (HYDRODIURIL) 25 MG tablet TAKE (1/2) TABLET DAILY.  Marland Kitchen  HYDROcodone-acetaminophen (NORCO/VICODIN) 5-325 MG tablet Take 1 tablet by mouth every 6 (six) hours as needed for moderate pain.  . magic mouthwash w/lidocaine SOLN Take 5 mLs by mouth 3 (three) times daily as needed for mouth pain. Mixed with equal parts of diphenhydramine Maalox nystatin and viscous lidocaine  . memantine (NAMENDA) 10 MG tablet Take 1 tablet (10 mg total) by mouth 2 (two) times daily.  . montelukast (SINGULAIR) 10 MG tablet Take 1 tablet (10 mg total) by mouth at bedtime.  Marland Kitchen omega-3 acid ethyl esters (LOVAZA) 1 g capsule TAKE (1) CAPSULE FOUR TIMES DAILY.  . rosuvastatin (CRESTOR) 20 MG tablet Take 1 tablet (20 mg total) by mouth daily.     Allergies:   Codeine, Mold extract [trichophyton], Penicillins, Ace inhibitors, Angiotensin receptor blockers, Levaquin  [levofloxacin], Vytorin [ezetimibe-simvastatin], and Zetia [ezetimibe]   Social History   Socioeconomic History  . Marital status: Married    Spouse name: Shelly Bennett  . Number of children: 4  . Years of education: 16  . Highest education level: Bachelor's degree (e.g., BA, AB, BS)  Occupational History  . Occupation: Pharmacist, hospital retired  Tobacco Use  . Smoking status: Never Smoker  . Smokeless tobacco: Never Used  Vaping Use  . Vaping Use: Never used  Substance and Sexual Activity  . Alcohol use: No  . Drug use: No  . Sexual activity: Not Currently    Birth control/protection: None  Other Topics Concern  . Not on file  Social History Narrative   Lives at home with her husband.   Right-handed.   1cup coffee each morning. 2-3 small soda cans each day.   Social Determinants of Health   Financial Resource Strain:   . Difficulty of Paying Living Expenses: Not on file  Food Insecurity:   . Worried About Charity fundraiser in the Last Year: Not on file  . Ran Out of Food in the Last Year: Not on file  Transportation Needs:   . Lack of Transportation (Medical): Not on file  . Lack of Transportation (Non-Medical): Not on file  Physical Activity:   . Days of Exercise per Week: Not on file  . Minutes of Exercise per Session: Not on file  Stress:   . Feeling of Stress : Not on file  Social Connections:   . Frequency of Communication with Friends and Family: Not on file  . Frequency of Social Gatherings with Friends and Family: Not on file  . Attends Religious Services: Not on file  . Active Member of Clubs or Organizations: Not on file  . Attends Archivist Meetings: Not on file  . Marital Status: Not on file     Family History: The patient's family history includes Alzheimer's disease in her father; Colon cancer (age of onset: 42) in her father; Dementia in her mother; Heart disease in her father and maternal grandfather; Heart disease (age of onset: 25) in her brother;  Heart murmur in her mother; Hyperlipidemia in her mother and sister; Hypertension in her mother and sister; Kidney disease in her father and mother; Osteoporosis in her mother; Prostate cancer in her father; Rectal cancer (age of onset: 34) in her maternal grandmother. There is no history of Colon polyps, Esophageal cancer, or Stomach cancer.  ROS:   Please see the history of present illness.    All other systems reviewed and are negative.  EKGs/Labs/Other Studies Reviewed:    The following studies were reviewed today: Echo 09-03-2019: IMPRESSIONS    1.  Left ventricular ejection fraction, by visual estimation, is 60 to  65%. The left ventricle has normal function. Left ventricular septal wall  thickness was normal. Normal left ventricular posterior wall thickness.  There is no left ventricular  hypertrophy.  2. Left ventricular diastolic Doppler parameters are consistent with  pseudonormalization pattern of LV diastolic filling.  3. GLS = -20.7%.     4. Global right ventricle has normal systolic function.The right  ventricular size is normal. No increase in right ventricular wall  thickness.  5. Left atrial size was normal.  6. Right atrial size was normal.  7. The mitral valve is normal in structure. No evidence of mitral valve  regurgitation.  8. The tricuspid valve is normal in structure. Tricuspid valve  regurgitation is trivial.  9. The aortic valve The aortic valve is normal in structure. Aortic valve  regurgitation was not visualized by color flow Doppler. Mild aortic valve  sclerosis without stenosis.  10. The pulmonic valve was normal in structure. Pulmonic valve  regurgitation is not visualized by color flow Doppler.  11. Normal pulmonary artery systolic pressure.  12. The atrial septum is grossly normal.   In comparison to the previous echocardiogram(s): 06/28/17 EF 55-60%. Mild  AS 26mmHg mean, 34mmHg peak.   Coronary CTA  09-15-2019: FINDINGS: Non-cardiac: See separate report from [facility].  Mildly calcified aortic valve. Pulmonary veins drain normally to the left atrium.  Calcium Score: 414 Agatston units.  Coronary Arteries: Right dominant with no anomalies  LM: No plaque or stenosis.  LAD system: Mixed plaque in the proximal LAD, mild (<50%) stenosis. Moderate D1 with calcified plaque in the mid-vessel, suspect mild (<50%) stenosis.  Circumflex system: Mixed plaque ostial LCx, mild (<50%) stenosis.  RCA system: Calcified plaque noted in proximal, mid and distal RCA. Minimal stenosis.  IMPRESSION: 1. Coronary artery calcium score 414 Agatston units. This places the patient in the 91st percentile for age and gender, suggesting high risk for future cardiac events.  2. Extensive plaque but suspect no more than mild stenosis. Will send FFR just to check on the ostial LCx and D1.  FINDINGS: FFR 0.95 mid D1  FFR 0.88 distal LAD  FFR 0.91 mid LCx  IMPRESSION: No evidence for hemodynamically significant coronary disease. EKG:  EKG is ordered today.  The ekg ordered today demonstrates NSR 62 bpm, leftward axis,   Recent Labs: 10/19/2019: ALT 30; BUN 10; Creatinine, Ser 0.84; Potassium 4.4; Sodium 143 11/23/2019: Hemoglobin 13.5; Platelets 329; TSH 2.210  Recent Lipid Panel    Component Value Date/Time   CHOL 192 10/19/2019 1154   TRIG 130 10/19/2019 1154   TRIG 116 05/01/2017 0958   HDL 61 10/19/2019 1154   HDL 64 05/01/2017 0958   CHOLHDL 3.1 10/19/2019 1154   LDLCALC 108 (H) 10/19/2019 1154   LDLCALC 74 08/19/2014 0920    Physical Exam:    VS:  BP 136/82   Pulse 62   Ht 5\' 8"  (1.727 m)   Wt 198 lb 9.6 oz (90.1 kg)   SpO2 97%   BMI 30.20 kg/m     Wt Readings from Last 3 Encounters:  08/24/20 198 lb 9.6 oz (90.1 kg)  06/16/20 201 lb (91.2 kg)  04/27/20 200 lb (90.7 kg)     GEN:  Well nourished, well developed in no acute distress HEENT:  Normal NECK: No JVD; No carotid bruits LYMPHATICS: No lymphadenopathy CARDIAC: RRR, no murmurs, rubs, gallops RESPIRATORY:  Clear to auscultation without rales, wheezing or rhonchi  ABDOMEN: Soft, non-tender, non-distended MUSCULOSKELETAL:  No edema; No deformity  SKIN: Warm and dry NEUROLOGIC:  Alert and oriented x 3 PSYCHIATRIC:  Normal affect   ASSESSMENT:    1. Bicuspid aortic valve   2. Essential hypertension   3. Mixed hyperlipidemia   4. Coronary artery disease involving native coronary artery of native heart without angina pectoris    PLAN:    In order of problems listed above:  1. Physical exam suggest mild to moderate aortic stenosis.  Her echo was performed 1 year ago and I reviewed this today.  We will repeat an echocardiogram next year at the time of her office visit. 2. Blood pressure is well controlled on amlodipine and hydrochlorothiazide. 3. Lipids are above goal.  LDL cholesterol is 108 mg/dL.  We discussed lifestyle modification with diet and exercise.  Her exercise options are very limited.  We talked about the idea of a stationary bike but I am not sure that she could do this.  I am going to touch base with her daughter to discuss this.  We talked about increasing her rosuvastatin but she has had some myalgias and is reluctant to do this.  She has been intolerant of Zetia because of cramping.  She has been intolerant of other statins.  Probably best to continue her current medical program. 4. No symptoms of angina.  Gated coronary CTA results reviewed with negative FFR evaluation.  Continue medical therapy.  Treated with aspirin 81 mg daily and a statin drug.    Medication Adjustments/Labs and Tests Ordered: Current medicines are reviewed at length with the patient today.  Concerns regarding medicines are outlined above.  Orders Placed This Encounter  Procedures  . EKG 12-Lead   No orders of the defined types were placed in this encounter.   Patient  Instructions  Medication Instructions:  Your provider recommends that you continue on your current medications as directed. Please refer to the Current Medication list given to you today.   *If you need a refill on your cardiac medications before your next appointment, please call your pharmacy*  Testing/Procedures: Your echo 9/24 has been cancelled.   Follow-Up: You will be called to arrange your 1 year echo and office visit.    Signed, Sherren Mocha, MD  08/24/2020 2:49 PM    [place]

## 2020-09-06 DIAGNOSIS — R238 Other skin changes: Secondary | ICD-10-CM | POA: Diagnosis not present

## 2020-09-06 DIAGNOSIS — L570 Actinic keratosis: Secondary | ICD-10-CM | POA: Diagnosis not present

## 2020-09-06 DIAGNOSIS — L720 Epidermal cyst: Secondary | ICD-10-CM | POA: Diagnosis not present

## 2020-09-09 ENCOUNTER — Other Ambulatory Visit (HOSPITAL_COMMUNITY): Payer: Medicare PPO

## 2020-09-29 ENCOUNTER — Encounter: Payer: Self-pay | Admitting: Family Medicine

## 2020-09-29 ENCOUNTER — Ambulatory Visit (INDEPENDENT_AMBULATORY_CARE_PROVIDER_SITE_OTHER): Payer: Medicare PPO | Admitting: Family Medicine

## 2020-09-29 ENCOUNTER — Telehealth: Payer: Self-pay

## 2020-09-29 ENCOUNTER — Other Ambulatory Visit: Payer: Self-pay

## 2020-09-29 VITALS — BP 134/69 | HR 60 | Temp 98.0°F | Ht 68.0 in | Wt 197.0 lb

## 2020-09-29 DIAGNOSIS — R350 Frequency of micturition: Secondary | ICD-10-CM

## 2020-09-29 DIAGNOSIS — E782 Mixed hyperlipidemia: Secondary | ICD-10-CM | POA: Diagnosis not present

## 2020-09-29 DIAGNOSIS — Z23 Encounter for immunization: Secondary | ICD-10-CM | POA: Diagnosis not present

## 2020-09-29 DIAGNOSIS — R296 Repeated falls: Secondary | ICD-10-CM

## 2020-09-29 DIAGNOSIS — I10 Essential (primary) hypertension: Secondary | ICD-10-CM

## 2020-09-29 DIAGNOSIS — R2689 Other abnormalities of gait and mobility: Secondary | ICD-10-CM

## 2020-09-29 LAB — URINALYSIS, COMPLETE
Bilirubin, UA: NEGATIVE
Glucose, UA: NEGATIVE
Ketones, UA: NEGATIVE
Nitrite, UA: NEGATIVE
Protein,UA: NEGATIVE
Specific Gravity, UA: 1.02 (ref 1.005–1.030)
Urobilinogen, Ur: 0.2 mg/dL (ref 0.2–1.0)
pH, UA: 7.5 (ref 5.0–7.5)

## 2020-09-29 LAB — MICROSCOPIC EXAMINATION: RBC, Urine: NONE SEEN /hpf (ref 0–2)

## 2020-09-29 MED ORDER — AMLODIPINE BESYLATE 10 MG PO TABS
ORAL_TABLET | ORAL | 3 refills | Status: DC
Start: 2020-09-29 — End: 2021-09-20

## 2020-09-29 MED ORDER — BUPROPION HCL ER (XL) 150 MG PO TB24
150.0000 mg | ORAL_TABLET | Freq: Every day | ORAL | 3 refills | Status: DC
Start: 2020-09-29 — End: 2021-09-20

## 2020-09-29 MED ORDER — OMEGA-3-ACID ETHYL ESTERS 1 G PO CAPS
2.0000 g | ORAL_CAPSULE | Freq: Two times a day (BID) | ORAL | 3 refills | Status: DC
Start: 2020-09-29 — End: 2021-09-20

## 2020-09-29 MED ORDER — ROSUVASTATIN CALCIUM 20 MG PO TABS
20.0000 mg | ORAL_TABLET | Freq: Every day | ORAL | 3 refills | Status: DC
Start: 2020-09-29 — End: 2021-09-20

## 2020-09-29 MED ORDER — HYDROCHLOROTHIAZIDE 25 MG PO TABS
ORAL_TABLET | ORAL | 3 refills | Status: DC
Start: 2020-09-29 — End: 2021-09-20

## 2020-09-29 NOTE — Progress Notes (Signed)
Pulse 60   Temp 98 F (36.7 C)   Ht _0  (1.727 m)   Wt 197 lb (89.4 kg)   SpO2 97%   BMI 29.95 kg/m    Subjective:   Patient ID: Shelly Bennett, female    DOB: 04-28-1951, 70 y.o.   MRN: [id number]  HPI: Shelly Bennett is a 69 y.o. female presenting on 09/29/2020 for Medical Management of Chronic Issues, balance, and increased urination   HPI Patient comes in complaining of urinary frequency especially at night she has to get up a couple times but then sometimes she will have increased urination associated with a bowel movement during the day.  She does admit to a history of irritable bowel syndrome.  Hypertension Patient is currently on amlodipine and hydrochlorothiazide, and their blood pressure today is 134/69. Patient denies any lightheadedness or dizziness. Patient denies headaches, blurred vision, chest pains, shortness of breath, or weakness. Denies any side effects from medication and is content with current medication.   Hyperlipidemia Patient is coming in for recheck of his hyperlipidemia. The patient is currently taking fish oil and Crestor. They deny any issues with myalgias or history of liver damage from it. They deny any focal numbness or weakness or chest pain.   Relevant past medical, surgical, family and social history reviewed and updated as indicated. Interim medical history since our last visit reviewed. Allergies and medications reviewed and updated.  Review of Systems  Constitutional: Negative for chills and fever.  Eyes: Negative for redness and visual disturbance.  Respiratory: Negative for chest tightness and shortness of breath.   Cardiovascular: Negative for chest pain and leg swelling.  Genitourinary: Negative for difficulty urinating and dysuria.  Musculoskeletal: Positive for gait problem. Negative for back pain.  Skin: Negative for rash.  Neurological: Positive for weakness. Negative for light-headedness and headaches.    Psychiatric/Behavioral: Negative for agitation and behavioral problems.  All other systems reviewed and are negative.   Per HPI unless specifically indicated above   Allergies as of 09/29/2020      Reactions   Codeine    ? Reaction ER visit.    Mold Extract [trichophyton]    Migraine   Penicillins Other (See Comments)   "drew my legs up as child" Pt has tolerated cephalexin in the past.   Ace Inhibitors Cough   Angiotensin Receptor Blockers Cough   Levaquin [levofloxacin] Rash   Per notes from outpatient provider   Vytorin [ezetimibe-simvastatin] Other (See Comments)   cramping   Zetia [ezetimibe] Other (See Comments)   cramping      Medication List       Accurate as of September 29, 2020 11:28 AM. If you have any questions, ask your nurse or doctor.        amLODipine 10 MG tablet Commonly known as: NORVASC TAKE (1/2) TABLET DAILY.   aspirin 81 MG tablet Take 81 mg by mouth daily.   buPROPion 150 MG 24 hr tablet Commonly known as: Wellbutrin XL Take 1 tablet (150 mg total) by mouth daily.   CALCARB 600 PO Take by mouth.   donepezil 10 MG tablet Commonly known as: ARICEPT Take 10 mg by mouth at bedtime. Per patient taking 1/2 tablet   fexofenadine 180 MG tablet Commonly known as: ALLEGRA Take 180 mg by mouth daily.   fluticasone 50 MCG/ACT nasal spray Commonly known as: FLONASE Place 2 sprays into both nostrils daily.   hydrochlorothiazide 25 MG tablet Commonly known as: HYDRODIURIL TAKE (1/2)  TABLET DAILY.   HYDROcodone-acetaminophen 5-325 MG tablet Commonly known as: NORCO/VICODIN Take 1 tablet by mouth every 6 (six) hours as needed for moderate pain.   magic mouthwash w/lidocaine Soln Take 5 mLs by mouth 3 (three) times daily as needed for mouth pain. Mixed with equal parts of diphenhydramine Maalox nystatin and viscous lidocaine   memantine 10 MG tablet Commonly known as: Namenda Take 1 tablet (10 mg total) by mouth 2 (two) times daily.    montelukast 10 MG tablet Commonly known as: SINGULAIR Take 1 tablet (10 mg total) by mouth at bedtime.   omega-3 acid ethyl esters 1 g capsule Commonly known as: LOVAZA TAKE (1) CAPSULE FOUR TIMES DAILY.   rosuvastatin 20 MG tablet Commonly known as: CRESTOR Take 1 tablet (20 mg total) by mouth daily.   Vitamin D-3 125 MCG (5000 UT) Tabs Take 1 capsule by mouth as directed. Take one capsule by mouth daily Mon thru Friday        Objective:   Pulse 60   Temp 98 F (36.7 C)   Ht _0  (1.727 m)   Wt 197 lb (89.4 kg)   SpO2 97%   BMI 29.95 kg/m   Wt Readings from Last 3 Encounters:  09/29/20 197 lb (89.4 kg)  08/24/20 198 lb 9.6 oz (90.1 kg)  06/16/20 201 lb (91.2 kg)    Physical Exam Vitals and nursing note reviewed.  Constitutional:      General: She is not in acute distress.    Appearance: She is well-developed. She is not diaphoretic.  Eyes:     Conjunctiva/sclera: Conjunctivae normal.  Cardiovascular:     Rate and Rhythm: Normal rate and regular rhythm.     Heart sounds: Normal heart sounds. No murmur heard.   Pulmonary:     Effort: Pulmonary effort is normal. No respiratory distress.     Breath sounds: Normal breath sounds. No wheezing.  Musculoskeletal:        General: No tenderness. Normal range of motion.  Skin:    General: Skin is warm and dry.     Findings: No rash.  Neurological:     Mental Status: She is alert and oriented to person, place, and time.     Coordination: Coordination normal.  Psychiatric:        Behavior: Behavior normal.      Urinalysis: 0-5 WBCs, no RBCs, 0-10 epithelial cells, no renal epithelial cells.  Amorphous sediment Terry, no mucus, few bacteria, no yeast, trace blood and trace leukocytes. Assessment & Plan:   Problem List Items Addressed This Visit      Cardiovascular and Mediastinum   Hypertension   Relevant Medications   amLODipine (NORVASC) 10 MG tablet   hydrochlorothiazide (HYDRODIURIL) 25 MG tablet    omega-3 acid ethyl esters (LOVAZA) 1 g capsule   rosuvastatin (CRESTOR) 20 MG tablet   Other Relevant Orders   CBC with Differential/Platelet (Completed)   CMP14+EGFR (Completed)     Other   Hyperlipemia   Relevant Medications   amLODipine (NORVASC) 10 MG tablet   hydrochlorothiazide (HYDRODIURIL) 25 MG tablet   omega-3 acid ethyl esters (LOVAZA) 1 g capsule   rosuvastatin (CRESTOR) 20 MG tablet   Other Relevant Orders   Lipid panel (Completed)    Other Visit Diagnoses    Increased frequency of urination    -  Primary   Relevant Orders   Urine Culture (Completed)   Urinalysis, Complete (Completed)   Flu vaccine need  Relevant Orders   Flu Vaccine QUAD High Dose(Fluad) (Completed)      Patient has a lot of weakness and gait instability, recommended for her to go back to physical therapy, she still has 4 sessions left on her current prescription and she can call them to go back.  Urinalysis shows no major issues on the urine, think it is more related to her bowels and recommended a bowel cleanout with some MiraLAX to get rid of constipation and see if she still has issues after that. Follow up plan: Return if symptoms worsen or fail to improve, for Hypertension and hyperlipidemia.  Counseling provided for all of the vaccine components Orders Placed This Encounter  Procedures  . Urine Culture  . Urinalysis, Complete    Caryl Pina, MD [facility] 09/29/2020, 11:28 AM

## 2020-09-29 NOTE — Telephone Encounter (Signed)
Pt just had apt with [facility] today. She walked over to PT building and was told that she needed a new order for PT for balance.  REFERRAL REQUEST Telephone Note  Have you been seen at our office for this problem? Discussed at ov today for balance (Advise that they may need an appointment with their PCP before a referral can be done)  Reason for Referral: PT balance Referral discussed with patient: today ov Best contact number of patient for referral team:   [phone] Has patient been seen by a specialist for this issue before: yes Patient provider preference for referral: [facility] Patient location preference for referral: [place]   Patient notified that referrals can take up to a week or longer to process. If they haven't heard anything within a week they should call back and speak with the referral department.

## 2020-09-29 NOTE — Telephone Encounter (Signed)
Placed new referral for physical therapy.

## 2020-09-30 LAB — URINE CULTURE

## 2020-09-30 LAB — CBC WITH DIFFERENTIAL/PLATELET
Basophils Absolute: 0 10*3/uL (ref 0.0–0.2)
Basos: 1 %
EOS (ABSOLUTE): 0.1 10*3/uL (ref 0.0–0.4)
Eos: 1 %
Hematocrit: 40.1 % (ref 34.0–46.6)
Hemoglobin: 13.7 g/dL (ref 11.1–15.9)
Immature Grans (Abs): 0 10*3/uL (ref 0.0–0.1)
Immature Granulocytes: 0 %
Lymphocytes Absolute: 1.7 10*3/uL (ref 0.7–3.1)
Lymphs: 26 %
MCH: 31.4 pg (ref 26.6–33.0)
MCHC: 34.2 g/dL (ref 31.5–35.7)
MCV: 92 fL (ref 79–97)
Monocytes Absolute: 0.4 10*3/uL (ref 0.1–0.9)
Monocytes: 7 %
Neutrophils Absolute: 4.2 10*3/uL (ref 1.4–7.0)
Neutrophils: 65 %
Platelets: 284 10*3/uL (ref 150–450)
RBC: 4.36 x10E6/uL (ref 3.77–5.28)
RDW: 13.3 % (ref 11.7–15.4)
WBC: 6.3 10*3/uL (ref 3.4–10.8)

## 2020-09-30 LAB — CMP14+EGFR
ALT: 20 IU/L (ref 0–32)
AST: 22 IU/L (ref 0–40)
Albumin/Globulin Ratio: 1.9 (ref 1.2–2.2)
Albumin: 4.4 g/dL (ref 3.8–4.8)
Alkaline Phosphatase: 83 IU/L (ref 44–121)
BUN/Creatinine Ratio: 13 (ref 12–28)
BUN: 11 mg/dL (ref 8–27)
Bilirubin Total: 0.6 mg/dL (ref 0.0–1.2)
CO2: 26 mmol/L (ref 20–29)
Calcium: 10.1 mg/dL (ref 8.7–10.3)
Chloride: 103 mmol/L (ref 96–106)
Creatinine, Ser: 0.86 mg/dL (ref 0.57–1.00)
GFR calc Af Amer: 79 mL/min/{1.73_m2} (ref >59)
GFR calc non Af Amer: 69 mL/min/{1.73_m2} (ref >59)
Globulin, Total: 2.3 g/dL (ref 1.5–4.5)
Glucose: 89 mg/dL (ref 65–99)
Potassium: 4.1 mmol/L (ref 3.5–5.2)
Sodium: 141 mmol/L (ref 134–144)
Total Protein: 6.7 g/dL (ref 6.0–8.5)

## 2020-09-30 LAB — LIPID PANEL
Chol/HDL Ratio: 2.4 ratio (ref 0.0–4.4)
Cholesterol, Total: 168 mg/dL (ref 100–199)
HDL: 69 mg/dL (ref >39)
LDL Chol Calc (NIH): 79 mg/dL (ref 0–99)
Triglycerides: 112 mg/dL (ref 0–149)
VLDL Cholesterol Cal: 20 mg/dL (ref 5–40)

## 2020-10-13 ENCOUNTER — Encounter: Payer: Self-pay | Admitting: Physical Therapy

## 2020-10-13 ENCOUNTER — Other Ambulatory Visit: Payer: Self-pay

## 2020-10-13 ENCOUNTER — Ambulatory Visit: Payer: Medicare PPO | Attending: Family Medicine | Admitting: Physical Therapy

## 2020-10-13 DIAGNOSIS — M6281 Muscle weakness (generalized): Secondary | ICD-10-CM

## 2020-10-13 DIAGNOSIS — R262 Difficulty in walking, not elsewhere classified: Secondary | ICD-10-CM | POA: Insufficient documentation

## 2020-10-13 DIAGNOSIS — R2681 Unsteadiness on feet: Secondary | ICD-10-CM | POA: Diagnosis not present

## 2020-10-13 NOTE — Therapy (Signed)
[facility] [place], [place], [address] Phone: [phone]   Fax:  [phone]  Physical Therapy Evaluation  Patient Details  Name: Shelly Bennett MRN: [id number] Date of Birth: 10-22-1951 Referring Provider (PT): Caryl Pina, MD   Encounter Date: 10/13/2020   PT End of Session - 10/13/20 1557    Visit Number 1    Number of Visits 12    Date for PT Re-Evaluation 12/01/20    Authorization Type [employer] (CQ modifier), Progress note every 10th visit, KX modifer at 15th visit.    PT Start Time 1300    PT Stop Time 1343    PT Time Calculation (min) 43 min    Activity Tolerance Patient tolerated treatment well    Behavior During Therapy WFL for tasks assessed/performed           Past Medical History:  Diagnosis Date   Adenomatous colon polyp    Allergic drug reaction 06/25/2015   Allergy    Anxiety    Arthritis    At risk for falls    fallen 4 times recently    CAD (coronary artery disease)    Dr. Burt Knack follows    Carpal tunnel syndrome    Chronic headaches    Coccydynia 02/01/2012   Cystocele    Diverticulosis    External hemorrhoids    Focal nodular hyperplasia of liver    Gait abnormality    GERD (gastroesophageal reflux disease)    some   HCAP (healthcare-associated pneumonia) 06/25/2015   Heart murmur    HLD (hyperlipidemia)    HTN (hypertension)    IBS (irritable bowel syndrome)    Memory loss    Neuromuscular disorder ([place])    some neuropathy issues in the past- legs fibromyalgia in past-- past hx of steroid injections   Obesity    Osteoporosis    Otitis of left ear 02/01/2017   Pneumonia    Skin cancer    squamous cell - face    Past Surgical History:  Procedure Laterality Date   CARPAL TUNNEL RELEASE     COLONOSCOPY  12/15/2008   diverticulosis, external hemorrhoids   KNEE SURGERY  05/22/2015   right   LAPAROSCOPY     SKIN SURGERY      squamous cell - right face     There were no vitals filed for this visit.    Subjective Assessment - 10/13/20 1550    Subjective COVID-19 screening performed upon arrival. Patient arrives to physical therapy with reports of decreased balance, difficulty walking on unstable surfaces, and bilateral LE weakness, L>R. Patient denies any falls in the past 6 months but reports a fear of falling and feeling like she will fall forward. Patient reports increased time to perform sit<>stand transfers, for ADLs, and home activities. Patient's goals are to improve movement, improve strength, improve balance and walk more confidently.    Pertinent History HTN, CAD, Aortic valve stenosis, Osteopenia    Limitations Lifting;Standing;Walking;House hold activities    Diagnostic tests n/a    Patient Stated Goals improve balance and walking, be more stable when coming to standing.              [facility] PT Assessment - 10/13/20 0001      Assessment   Medical Diagnosis Balance disorder, recurrent falls    Referring Provider (PT) Caryl Pina, MD    Onset Date/Surgical Date --   ongoing   Next MD Visit "6 months"    Prior  Therapy yes      Precautions   Precautions Fall      Restrictions   Weight Bearing Restrictions No      Balance Screen   Has the patient fallen in the past 6 months No    Has the patient had a decrease in activity level because of a fear of falling?  No    Is the patient reluctant to leave their home because of a fear of falling?  No      Home Environment   Living Environment Private residence    Living Arrangements Spouse/significant other      Posture/Postural Control   Posture/Postural Control Postural limitations    Postural Limitations Rounded Shoulders;Forward head      ROM / Strength   AROM / PROM / Strength Strength      Strength   Overall Strength Deficits    Strength Assessment Site Hip;Knee    Right/Left Hip Right;Left    Right Hip Flexion 3+/5    Right Hip  ABduction 4-/5    Left Hip Flexion 4-/5    Left Hip ABduction 4-/5    Right/Left Knee Left;Right    Right Knee Flexion 4/5    Right Knee Extension 4/5    Left Knee Flexion 4/5    Left Knee Extension 4/5      Transfers   Comments use of bilateral UEs for transfers sit <>stand      Ambulation/Gait   Assistive device None    Gait Pattern Step-through pattern;Decreased stride length;Trunk flexed;Wide base of support      Standardized Balance Assessment   Standardized Balance Assessment Five Times Sit to Stand;Berg Balance Test    Five times sit to stand comments  with UE support: 23.78 seconds      Berg Balance Test   Sit to Stand Able to stand  independently using hands    Standing Unsupported Able to stand 2 minutes with supervision    Sitting with Back Unsupported but Feet Supported on Floor or Stool Able to sit safely and securely 2 minutes    Stand to Sit Controls descent by using hands    Transfers Able to transfer safely, definite need of hands    Standing Unsupported with Eyes Closed Able to stand 10 seconds with supervision    Standing Unsupported with Feet Together Able to place feet together independently and stand for 1 minute with supervision    From Standing, Reach Forward with Outstretched Arm Can reach confidently >25 cm (10")    From Standing Position, Pick up Object from Floor Able to pick up shoe, needs supervision    From Standing Position, Turn to Look Behind Over each Shoulder Looks behind from both sides and weight shifts well    Turn 360 Degrees Able to turn 360 degrees safely one side only in 4 seconds or less    Standing Unsupported, Alternately Place Feet on Step/Stool Able to stand independently and complete 8 steps >20 seconds    Standing Unsupported, One Foot in Front Able to take small step independently and hold 30 seconds    Standing on One Leg Tries to lift leg/unable to hold 3 seconds but remains standing independently    Total Score 42                       Objective measurements completed on examination: See above findings.               PT  Education - 10/13/20 1556    Education Details Seated: LAQ, clam shells, pillow squeeze; standing: heel/toe raises    Person(s) Educated Patient    Methods Explanation;Handout;Demonstration    Comprehension Verbalized understanding               PT Long Term Goals - 10/13/20 1711      PT LONG TERM GOAL #1   Title Patient will be independent with HEP    Time 6    Period Weeks    Status New      PT LONG TERM GOAL #2   Title Patient will decrease risk of falls as noted by the ability to perform modified 5x sit to stand with bilateral UE support in 18 seconds or less.    Time 6    Period Weeks    Status New      PT LONG TERM GOAL #3   Title Patient will demonstrate a 3+ point improvement on Berg Balance Scale to decrease risk of falls.    Baseline --    Period Weeks    Status New      PT LONG TERM GOAL #4   Title Patient will demonstrate 4+/5 or greater bilateral LE MMT to improve stability during functional tasks.    Baseline --    Time 6    Period Weeks    Status New                  Plan - 10/13/20 1601    Clinical Impression Statement Patient is a 69 year old female who presents to physical therapy with decreased bilateral LE weakness, right weaker than left and decreased balance that has been ongoing for a few years. Patient's modified 5x sit to stand time of 23.78 seconds categorizes her as a fall risk with decreased LE strength. Berg Balance Score of 42/56 categorizes her as a low fall risk. Patient and PT discussed plan of care and HEP to which patient reported understanding. Patient would benefit from skilled physical therapy to address deficits and patient's goals.    Comorbidities HTN, CAD, Aortic valve stenosis, Osteopenia    Examination-Activity Limitations Locomotion Level;Transfers;Stand;Dressing    Examination-Participation  Restrictions Cleaning    Stability/Clinical Decision Making Stable/Uncomplicated    Clinical Decision Making Low    Rehab Potential Good    PT Frequency 2x / week    PT Duration 6 weeks    PT Treatment/Interventions ADLs/Self Care Home Management;Gait training;Stair training;Functional mobility training;Therapeutic activities;Therapeutic exercise;Balance training;Neuromuscular re-education;Manual techniques;Passive range of motion;Patient/family education    PT Next Visit Plan nustep, LE strengthening and stretching, balance activities, progress to dynamic balance activities.    PT Home Exercise Plan see patient education.    Consulted and Agree with Plan of Care Patient           Patient will benefit from skilled therapeutic intervention in order to improve the following deficits and impairments:  Abnormal gait, Decreased activity tolerance, Decreased balance, Decreased strength, Difficulty walking, Decreased range of motion, Pain, Postural dysfunction  Visit Diagnosis: Unsteadiness on feet  Muscle weakness (generalized)  Difficulty in walking, not elsewhere classified     Problem List Patient Active Problem List   Diagnosis Date Noted   Spinal stenosis of lumbar region 06/16/2020   Gait abnormality 03/10/2020   Chest pain 02/17/2020   Gastroesophageal reflux disease 10/19/2019   Hypertensive disorder 10/19/2019   Third degree uterine prolapse 10/19/2019   Spinal stenosis of lumbar region without neurogenic claudication 09/15/2019  Urinary and fecal incontinence 09/15/2019   Cystocele with rectocele 09/15/2019   Radial styloid tenosynovitis (de quervain) 06/24/2019   Trigger finger, right middle finger 06/24/2019   Pain in left wrist 05/27/2019   Trigger thumb, right thumb 10/29/2018   Memory loss 07/22/2018   Low back pain 07/22/2018   Left lumbar radiculopathy 01/22/2018   Mild cognitive impairment 10/22/2017   Anxiety 10/22/2017   Aortic  atherosclerosis ([place]) 12/04/2016   Ptosis of right eyelid 02/01/2016   Dermatochalasis of both upper eyelids 03/07/2015   Vitamin D deficiency 08/19/2014   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 37/03/8888   Family history of colon cancer-father at 58+ grandparent w/ rectal cancer 01/14/2014   MGD (meibomian gland dysfunction) 10/14/2013   Injection of surface of eye 10/14/2013   Osteopenia 08/12/2013   CAD (coronary artery disease), native coronary artery 03/22/2013   Aortic valve stenosis, mild 03/22/2013   Hyperlipemia 03/11/2013   Hypertension 03/11/2013   Allergic rhinitis 03/11/2013   Pain in right shoulder 03/11/2013   IBS (irritable bowel syndrome) - with chronic recurrent abdominal pain 02/01/2012   Personal history of colonic polyps - adenoma 02/01/2012    Gabriela Eves, PT, DPT  10/13/2020, 5:17 PM  [facility] [address] [place], [place], [address] Phone: [phone]   Fax:  [phone]  Name: Rosa Wyly MRN: [id number] Date of Birth: 1951/08/24

## 2020-10-18 ENCOUNTER — Ambulatory Visit: Payer: Medicare PPO | Attending: Family Medicine | Admitting: Physical Therapy

## 2020-10-18 ENCOUNTER — Encounter: Payer: Self-pay | Admitting: Physical Therapy

## 2020-10-18 ENCOUNTER — Other Ambulatory Visit: Payer: Self-pay

## 2020-10-18 DIAGNOSIS — R2681 Unsteadiness on feet: Secondary | ICD-10-CM | POA: Insufficient documentation

## 2020-10-18 DIAGNOSIS — R262 Difficulty in walking, not elsewhere classified: Secondary | ICD-10-CM | POA: Diagnosis not present

## 2020-10-18 DIAGNOSIS — M6281 Muscle weakness (generalized): Secondary | ICD-10-CM | POA: Insufficient documentation

## 2020-10-18 NOTE — Therapy (Signed)
[facility] [place], [place], [address] Phone: [phone]   Fax:  [phone]  Physical Therapy Treatment  Patient Details  Name: Shelly Bennett MRN: [id number] Date of Birth: 04-Oct-1951 Referring Provider (PT): Caryl Pina, MD   Encounter Date: 10/18/2020   PT End of Session - 10/18/20 1046    Visit Number 2    Number of Visits 12    Date for PT Re-Evaluation 12/01/20    Authorization Type [employer] Medicare (CQ modifier), Progress note every 10th visit, KX modifer at 15th visit.    PT Start Time 1030    PT Stop Time 1115    PT Time Calculation (min) 45 min    Activity Tolerance Patient tolerated treatment well    Behavior During Therapy WFL for tasks assessed/performed           Past Medical History:  Diagnosis Date  . Adenomatous colon polyp   . Allergic drug reaction 06/25/2015  . Allergy   . Anxiety   . Arthritis   . At risk for falls    fallen 4 times recently   . CAD (coronary artery disease)    Dr. Burt Knack follows   . Carpal tunnel syndrome   . Chronic headaches   . Coccydynia 02/01/2012  . Cystocele   . Diverticulosis   . External hemorrhoids   . Focal nodular hyperplasia of liver   . Gait abnormality   . GERD (gastroesophageal reflux disease)    some  . HCAP (healthcare-associated pneumonia) 06/25/2015  . Heart murmur   . HLD (hyperlipidemia)   . HTN (hypertension)   . IBS (irritable bowel syndrome)   . Memory loss   . Neuromuscular disorder ([place])    some neuropathy issues in the past- legs fibromyalgia in past-- past hx of steroid injections  . Obesity   . Osteoporosis   . Otitis of left ear 02/01/2017  . Pneumonia   . Skin cancer    squamous cell - face    Past Surgical History:  Procedure Laterality Date  . CARPAL TUNNEL RELEASE    . COLONOSCOPY  12/15/2008   diverticulosis, external hemorrhoids  . KNEE SURGERY  05/22/2015   right  . LAPAROSCOPY    . SKIN SURGERY      squamous cell - right face     There were no vitals filed for this visit.   Subjective Assessment - 10/18/20 1047    Subjective COVID-19 screening performed upon arrival. Patient reports doing well but with left groin and hip pain today.    Pertinent History HTN, CAD, Aortic valve stenosis, Osteopenia    Limitations Lifting;Standing;Walking;House hold activities    Diagnostic tests n/a    Currently in Pain? Yes    Pain Location Hip    Pain Orientation Left    Pain Descriptors / Indicators Aching    Pain Type Chronic pain    Pain Onset More than a month ago              [facility] PT Assessment - 10/18/20 0001      Assessment   Medical Diagnosis Balance disorder, recurrent falls    Referring Provider (PT) Caryl Pina, MD    Next MD Visit "6 months"    Prior Therapy yes      Precautions   Precautions Fall      Restrictions   Weight Bearing Restrictions No  [facility] Adult PT Treatment/Exercise - 10/18/20 0001      Exercises   Exercises Knee/Hip;Lumbar      Lumbar Exercises: Seated   Other Seated Lumbar Exercises red physioball core crunch x20 3" hold      Knee/Hip Exercises: Stretches   Hip Flexor Stretch Both;Other (comment)   5" hold x10     Knee/Hip Exercises: Aerobic   Nustep level 4 x15 mins      Knee/Hip Exercises: Standing   Forward Step Up Both;10 reps;Hand Hold: 2;Step Height: 4"    Rocker Board 3 minutes      Knee/Hip Exercises: Seated   Clamshell with TheraBand Yellow   x30   Marching Strengthening;Both;2 sets;10 reps    Marching Limitations yellow theraband    Hamstring Curl Strengthening;Both;2 sets;10 reps    Hamstring Limitations yellow theraband    Sit to Sand 2 sets;10 reps;with UE support   1 UE support, switching after 1st set                      PT Long Term Goals - 10/13/20 1711      PT LONG TERM GOAL #1   Title Patient will be independent with HEP    Time 6    Period Weeks     Status New      PT LONG TERM GOAL #2   Title Patient will decrease risk of falls as noted by the ability to perform modified 5x sit to stand with bilateral UE support in 18 seconds or less.    Time 6    Period Weeks    Status New      PT LONG TERM GOAL #3   Title Patient will demonstrate a 3+ point improvement on Berg Balance Scale to decrease risk of falls.    Baseline --    Period Weeks    Status New      PT LONG TERM GOAL #4   Title Patient will demonstrate 4+/5 or greater bilateral LE MMT to improve stability during functional tasks.    Baseline --    Time 6    Period Weeks    Status New                 Plan - 10/18/20 1111    Clinical Impression Statement Patient responded well to therapy session though with fatigue. Patient guided through standing and seated TEs with good technique. Patient required intermittent rest breaks between exercises secondary to fatigue and SOB but overall was able to complete all reps before resting. Patient performed self stretching for hip and low back intermittently throughout session. Patient educated she may feel sore after today's session to which she reported understanding.    Comorbidities HTN, CAD, Aortic valve stenosis, Osteopenia    Examination-Activity Limitations Locomotion Level;Transfers;Stand;Dressing    Examination-Participation Restrictions Cleaning    Stability/Clinical Decision Making Stable/Uncomplicated    Clinical Decision Making Low    Rehab Potential Good    PT Frequency 2x / week    PT Duration 6 weeks    PT Treatment/Interventions ADLs/Self Care Home Management;Gait training;Stair training;Functional mobility training;Therapeutic activities;Therapeutic exercise;Balance training;Neuromuscular re-education;Manual techniques;Passive range of motion;Patient/family education    PT Next Visit Plan nustep, LE strengthening and stretching, balance activities, progress to dynamic balance activities.    PT Home Exercise Plan  see patient education.    Consulted and Agree with Plan of Care Patient           Patient will benefit  from skilled therapeutic intervention in order to improve the following deficits and impairments:  Abnormal gait, Decreased activity tolerance, Decreased balance, Decreased strength, Difficulty walking, Decreased range of motion, Pain, Postural dysfunction  Visit Diagnosis: Unsteadiness on feet  Muscle weakness (generalized)  Difficulty in walking, not elsewhere classified     Problem List Patient Active Problem List   Diagnosis Date Noted  . Spinal stenosis of lumbar region 06/16/2020  . Gait abnormality 03/10/2020  . Chest pain 02/17/2020  . Gastroesophageal reflux disease 10/19/2019  . Hypertensive disorder 10/19/2019  . Third degree uterine prolapse 10/19/2019  . Spinal stenosis of lumbar region without neurogenic claudication 09/15/2019  . Urinary and fecal incontinence 09/15/2019  . Cystocele with rectocele 09/15/2019  . Radial styloid tenosynovitis (de quervain) 06/24/2019  . Trigger finger, right middle finger 06/24/2019  . Pain in left wrist 05/27/2019  . Trigger thumb, right thumb 10/29/2018  . Memory loss 07/22/2018  . Low back pain 07/22/2018  . Left lumbar radiculopathy 01/22/2018  . Mild cognitive impairment 10/22/2017  . Anxiety 10/22/2017  . Aortic atherosclerosis ([place]) 12/04/2016  . Ptosis of right eyelid 02/01/2016  . Dermatochalasis of both upper eyelids 03/07/2015  . Vitamin D deficiency 08/19/2014  . 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 Family history of colon cancer-father at 80+ grandparent w/ rectal cancer 01/14/2014  . MGD (meibomian gland dysfunction) 10/14/2013  . Injection of surface of eye 10/14/2013  . Osteopenia 08/12/2013  . CAD (coronary artery disease), native coronary artery 03/22/2013  . Aortic valve stenosis, mild 03/22/2013  . Hyperlipemia 03/11/2013  . Hypertension 03/11/2013  . Allergic  rhinitis 03/11/2013  . Pain in right shoulder 03/11/2013  . IBS (irritable bowel syndrome) - with chronic recurrent abdominal pain 02/01/2012  . Personal history of colonic polyps - adenoma 02/01/2012    Gabriela Eves, PT, DPT 10/18/2020, 11:25 AM  [facility] [address] [place], [place], [address] Phone: [phone]   Fax:  [phone]  Name: Shelly Bennett MRN: [id number] Date of Birth: May 28, 1951

## 2020-10-20 ENCOUNTER — Ambulatory Visit: Payer: Medicare PPO | Admitting: Physical Therapy

## 2020-10-20 ENCOUNTER — Other Ambulatory Visit: Payer: Self-pay

## 2020-10-20 DIAGNOSIS — M6281 Muscle weakness (generalized): Secondary | ICD-10-CM

## 2020-10-20 DIAGNOSIS — R262 Difficulty in walking, not elsewhere classified: Secondary | ICD-10-CM

## 2020-10-20 DIAGNOSIS — R2681 Unsteadiness on feet: Secondary | ICD-10-CM

## 2020-10-20 NOTE — Therapy (Signed)
[facility] [place], [place], [address] Phone: [phone]   Fax:  [phone]  Physical Therapy Treatment  Patient Details  Name: Shelly Bennett MRN: [id number] Date of Birth: 1950/12/31 Referring Provider (PT): Caryl Pina, MD   Encounter Date: 10/20/2020   PT End of Session - 10/20/20 0958    Visit Number 3    Number of Visits 12    Date for PT Re-Evaluation 12/01/20    Authorization Type [employer] Medicare (CQ modifier), Progress note every 10th visit, KX modifer at 15th visit.    PT Start Time 0955   10 min late arrival today   PT Stop Time 1031    PT Time Calculation (min) 36 min    Activity Tolerance Patient tolerated treatment well    Behavior During Therapy WFL for tasks assessed/performed           Past Medical History:  Diagnosis Date  . Adenomatous colon polyp   . Allergic drug reaction 06/25/2015  . Allergy   . Anxiety   . Arthritis   . At risk for falls    fallen 4 times recently   . CAD (coronary artery disease)    Dr. Burt Knack follows   . Carpal tunnel syndrome   . Chronic headaches   . Coccydynia 02/01/2012  . Cystocele   . Diverticulosis   . External hemorrhoids   . Focal nodular hyperplasia of liver   . Gait abnormality   . GERD (gastroesophageal reflux disease)    some  . HCAP (healthcare-associated pneumonia) 06/25/2015  . Heart murmur   . HLD (hyperlipidemia)   . HTN (hypertension)   . IBS (irritable bowel syndrome)   . Memory loss   . Neuromuscular disorder ([place])    some neuropathy issues in the past- legs fibromyalgia in past-- past hx of steroid injections  . Obesity   . Osteoporosis   . Otitis of left ear 02/01/2017  . Pneumonia   . Skin cancer    squamous cell - face    Past Surgical History:  Procedure Laterality Date  . CARPAL TUNNEL RELEASE    . COLONOSCOPY  12/15/2008   diverticulosis, external hemorrhoids  . KNEE SURGERY  05/22/2015   right  . LAPAROSCOPY     . SKIN SURGERY     squamous cell - right face     There were no vitals filed for this visit.   Subjective Assessment - 10/20/20 0957    Subjective COVID-19 screening performed upon arrival. Patient reports good after last treatment yet some knee pain after 15 min on nustep    Pertinent History HTN, CAD, Aortic valve stenosis, Osteopenia    Limitations Lifting;Standing;Walking;House hold activities    Diagnostic tests n/a    Patient Stated Goals improve balance and walking, be more stable when coming to standing.    Currently in Pain? Yes    Pain Score --   no nuber provided   Pain Location Hip   knee   Pain Orientation Left    Pain Descriptors / Indicators Aching;Discomfort    Pain Type Chronic pain    Pain Onset More than a month ago    Pain Frequency Intermittent    Aggravating Factors  prolong activity    Pain Relieving Factors rest                             [facility] Adult PT Treatment/Exercise - 10/20/20 0001  Lumbar Exercises: Seated   Other Seated Lumbar Exercises red physioball core crunch x20 3" hold      Knee/Hip Exercises: Stretches   Hip Flexor Stretch Both;5 reps;10 seconds      Knee/Hip Exercises: Aerobic   Nustep L4 x57min UE/LE activity      Knee/Hip Exercises: Standing   Knee Flexion AROM;Both;2 sets;10 reps    Knee Flexion Limitations 6" box toe taps    Rocker Board 3 minutes    Other Standing Knee Exercises side stepping  x5 reps      Knee/Hip Exercises: Seated   Clamshell with TheraBand Yellow   x30   Marching Strengthening;Both;2 sets;10 reps    Marching Limitations yellow theraband    Hamstring Curl Strengthening;Both;2 sets;10 reps    Hamstring Limitations yellow theraband                       PT Long Term Goals - 10/20/20 0959      PT LONG TERM GOAL #1   Title Patient will be independent with HEP    Time 6    Period Weeks    Status On-going      PT LONG TERM GOAL #2   Title Patient will decrease  risk of falls as noted by the ability to perform modified 5x sit to stand with bilateral UE support in 18 seconds or less.    Baseline unable to test due to left knee pain 10/20/20    Time 6    Period Weeks    Status On-going      PT LONG TERM GOAL #3   Title Patient will demonstrate a 3+ point improvement on Berg Balance Scale to decrease risk of falls.    Baseline NT 10/20/20    Time 6    Period Weeks    Status On-going      PT LONG TERM GOAL #4   Title Patient will demonstrate 4+/5 or greater bilateral LE MMT to improve stability during functional tasks.    Baseline limited stretch training today due to left knee pain 10/20/20    Time 6    Period Weeks    Status On-going                 Plan - 10/20/20 1032    Clinical Impression Statement Patient arrived with ongoing hip pain and left knee pain that flared up after possible prolong activity on nustep and reported a lot of sitting yesterday. Today modified exercises to avoid increased pain. Patient limited with standing and strength activities today. Patient doing HEP as provided by PT. Goals progressing today due to limitations.    Comorbidities HTN, CAD, Aortic valve stenosis, Osteopenia    Examination-Activity Limitations Locomotion Level;Transfers;Stand;Dressing    Examination-Participation Restrictions Cleaning    Stability/Clinical Decision Making Stable/Uncomplicated    Rehab Potential Good    PT Frequency 2x / week    PT Duration 6 weeks    PT Treatment/Interventions ADLs/Self Care Home Management;Gait training;Stair training;Functional mobility training;Therapeutic activities;Therapeutic exercise;Balance training;Neuromuscular re-education;Manual techniques;Passive range of motion;Patient/family education    PT Next Visit Plan monitor left knee pain and cont with nustep, LE strengthening and stretching, balance activities, progress to dynamic balance activities.    Consulted and Agree with Plan of Care Patient             Patient will benefit from skilled therapeutic intervention in order to improve the following deficits and impairments:  Abnormal gait, Decreased activity tolerance, Decreased  balance, Decreased strength, Difficulty walking, Decreased range of motion, Pain, Postural dysfunction  Visit Diagnosis: Unsteadiness on feet  Muscle weakness (generalized)  Difficulty in walking, not elsewhere classified     Problem List Patient Active Problem List   Diagnosis Date Noted  . Spinal stenosis of lumbar region 06/16/2020  . Gait abnormality 03/10/2020  . Chest pain 02/17/2020  . Gastroesophageal reflux disease 10/19/2019  . Hypertensive disorder 10/19/2019  . Third degree uterine prolapse 10/19/2019  . Spinal stenosis of lumbar region without neurogenic claudication 09/15/2019  . Urinary and fecal incontinence 09/15/2019  . Cystocele with rectocele 09/15/2019  . Radial styloid tenosynovitis (de quervain) 06/24/2019  . Trigger finger, right middle finger 06/24/2019  . Pain in left wrist 05/27/2019  . Trigger thumb, right thumb 10/29/2018  . Memory loss 07/22/2018  . Low back pain 07/22/2018  . Left lumbar radiculopathy 01/22/2018  . Mild cognitive impairment 10/22/2017  . Anxiety 10/22/2017  . Aortic atherosclerosis ([place]) 12/04/2016  . Ptosis of right eyelid 02/01/2016  . Dermatochalasis of both upper eyelids 03/07/2015  . Vitamin D deficiency 08/19/2014  . 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 Family history of colon cancer-father at 18+ grandparent w/ rectal cancer 01/14/2014  . MGD (meibomian gland dysfunction) 10/14/2013  . Injection of surface of eye 10/14/2013  . Osteopenia 08/12/2013  . CAD (coronary artery disease), native coronary artery 03/22/2013  . Aortic valve stenosis, mild 03/22/2013  . Hyperlipemia 03/11/2013  . Hypertension 03/11/2013  . Allergic rhinitis 03/11/2013  . Pain in right shoulder 03/11/2013  . IBS (irritable bowel  syndrome) - with chronic recurrent abdominal pain 02/01/2012  . Personal history of colonic polyps - adenoma 02/01/2012    Phillips Climes, PTA 10/20/2020, 10:34 AM  [facility] [place], [place], [address] Phone: [phone]   Fax:  [phone]  Name: Janet Humphreys MRN: [id number] Date of Birth: Dec 03, 1951

## 2020-10-25 ENCOUNTER — Ambulatory Visit: Payer: Medicare PPO | Admitting: *Deleted

## 2020-10-25 ENCOUNTER — Other Ambulatory Visit: Payer: Self-pay

## 2020-10-25 DIAGNOSIS — R2681 Unsteadiness on feet: Secondary | ICD-10-CM

## 2020-10-25 DIAGNOSIS — R262 Difficulty in walking, not elsewhere classified: Secondary | ICD-10-CM | POA: Diagnosis not present

## 2020-10-25 DIAGNOSIS — M6281 Muscle weakness (generalized): Secondary | ICD-10-CM

## 2020-10-25 NOTE — Therapy (Signed)
[facility] [place], [place], [address] Phone: [phone]   Fax:  [phone]  Physical Therapy Treatment  Patient Details  Name: Shelly Bennett MRN: [id number] Date of Birth: July 19, 1951 Referring Provider (PT): Caryl Pina, MD   Encounter Date: 10/25/2020   PT End of Session - 10/25/20 1328    Visit Number 4    Number of Visits 12    Date for PT Re-Evaluation 12/01/20    Authorization Type [employer] Medicare (CQ modifier), Progress note every 10th visit, KX modifer at 15th visit.    PT Start Time 1030    PT Stop Time 1118    PT Time Calculation (min) 48 min           Past Medical History:  Diagnosis Date  . Adenomatous colon polyp   . Allergic drug reaction 06/25/2015  . Allergy   . Anxiety   . Arthritis   . At risk for falls    fallen 4 times recently   . CAD (coronary artery disease)    Dr. Burt Knack follows   . Carpal tunnel syndrome   . Chronic headaches   . Coccydynia 02/01/2012  . Cystocele   . Diverticulosis   . External hemorrhoids   . Focal nodular hyperplasia of liver   . Gait abnormality   . GERD (gastroesophageal reflux disease)    some  . HCAP (healthcare-associated pneumonia) 06/25/2015  . Heart murmur   . HLD (hyperlipidemia)   . HTN (hypertension)   . IBS (irritable bowel syndrome)   . Memory loss   . Neuromuscular disorder ([place])    some neuropathy issues in the past- legs fibromyalgia in past-- past hx of steroid injections  . Obesity   . Osteoporosis   . Otitis of left ear 02/01/2017  . Pneumonia   . Skin cancer    squamous cell - face    Past Surgical History:  Procedure Laterality Date  . CARPAL TUNNEL RELEASE    . COLONOSCOPY  12/15/2008   diverticulosis, external hemorrhoids  . KNEE SURGERY  05/22/2015   right  . LAPAROSCOPY    . SKIN SURGERY     squamous cell - right face     There were no vitals filed for this visit.   Subjective Assessment - 10/25/20 1103      Subjective COVID-19 screening performed upon arrival. Patient reports good after last treatment. Getting better    Pertinent History HTN, CAD, Aortic valve stenosis, Osteopenia    Limitations Lifting;Standing;Walking;House hold activities    Patient Stated Goals improve balance and walking, be more stable when coming to standing.    Currently in Pain? Yes    Pain Score 5     Pain Orientation Left    Pain Descriptors / Indicators Aching;Discomfort    Pain Type Chronic pain    Pain Onset More than a month ago                             [facility] Adult PT Treatment/Exercise - 10/25/20 0001      Exercises   Exercises Knee/Hip;Lumbar      Knee/Hip Exercises: Aerobic   Nustep L4 x4min UE/LE activity      Knee/Hip Exercises: Standing   Heel Raises Both;2 sets;20 reps    Rocker Board 3 minutes    SLS for each LE x 5    Other Standing Knee Exercises side stepping on balance beam  Knee/Hip Exercises: Seated   Clamshell with TheraBand Red   x30    hold 5 secs   Marching Strengthening;Both;2 sets;10 reps    Hamstring Curl Strengthening;Both;2 sets;10 reps    Hamstring Limitations red    Sit to Sand 10 reps;without UE support                       PT Long Term Goals - 10/20/20 0959      PT LONG TERM GOAL #1   Title Patient will be independent with HEP    Time 6    Period Weeks    Status On-going      PT LONG TERM GOAL #2   Title Patient will decrease risk of falls as noted by the ability to perform modified 5x sit to stand with bilateral UE support in 18 seconds or less.    Baseline unable to test due to left knee pain 10/20/20    Time 6    Period Weeks    Status On-going      PT LONG TERM GOAL #3   Title Patient will demonstrate a 3+ point improvement on Berg Balance Scale to decrease risk of falls.    Baseline NT 10/20/20    Time 6    Period Weeks    Status On-going      PT LONG TERM GOAL #4   Title Patient will demonstrate 4+/5 or  greater bilateral LE MMT to improve stability during functional tasks.    Baseline limited stretch training today due to left knee pain 10/20/20    Time 6    Period Weeks    Status On-going                 Plan - 10/25/20 1335    Clinical Impression Statement Pt arrived today doing fairly well and reports doing better at home. Rx focused on balance and HEP for balance. She also performed LE strengthening exs [facility] as well as [facility]    Comorbidities HTN, CAD, Aortic valve stenosis, Osteopenia    Examination-Activity Limitations Locomotion Level;Transfers;Stand;Dressing    Stability/Clinical Decision Making Stable/Uncomplicated    Rehab Potential Good    PT Frequency 2x / week    PT Duration 6 weeks    PT Treatment/Interventions ADLs/Self Care Home Management;Gait training;Stair training;Functional mobility training;Therapeutic activities;Therapeutic exercise;Balance training;Neuromuscular re-education;Manual techniques;Passive range of motion;Patient/family education    PT Next Visit Plan monitor left knee pain and cont with nustep, LE strengthening and stretching, balance activities, progress to dynamic balance activities.           Patient will benefit from skilled therapeutic intervention in order to improve the following deficits and impairments:  Abnormal gait, Decreased activity tolerance, Decreased balance, Decreased strength, Difficulty walking, Decreased range of motion, Pain, Postural dysfunction  Visit Diagnosis: Unsteadiness on feet  Muscle weakness (generalized)  Difficulty in walking, not elsewhere classified     Problem List Patient Active Problem List   Diagnosis Date Noted  . Spinal stenosis of lumbar region 06/16/2020  . Gait abnormality 03/10/2020  . Chest pain 02/17/2020  . Gastroesophageal reflux disease 10/19/2019  . Hypertensive disorder 10/19/2019  . Third degree uterine prolapse 10/19/2019  . Spinal stenosis of lumbar region without neurogenic  claudication 09/15/2019  . Urinary and fecal incontinence 09/15/2019  . Cystocele with rectocele 09/15/2019  . Radial styloid tenosynovitis (de quervain) 06/24/2019  . Trigger finger, right middle finger 06/24/2019  . Pain in left wrist 05/27/2019  .  Trigger thumb, right thumb 10/29/2018  . Memory loss 07/22/2018  . Low back pain 07/22/2018  . Left lumbar radiculopathy 01/22/2018  . Mild cognitive impairment 10/22/2017  . Anxiety 10/22/2017  . Aortic atherosclerosis ([place]) 12/04/2016  . Ptosis of right eyelid 02/01/2016  . Dermatochalasis of both upper eyelids 03/07/2015  . Vitamin D deficiency 08/19/2014  . 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 Family history of colon cancer-father at 31+ grandparent w/ rectal cancer 01/14/2014  . MGD (meibomian gland dysfunction) 10/14/2013  . Injection of surface of eye 10/14/2013  . Osteopenia 08/12/2013  . CAD (coronary artery disease), native coronary artery 03/22/2013  . Aortic valve stenosis, mild 03/22/2013  . Hyperlipemia 03/11/2013  . Hypertension 03/11/2013  . Allergic rhinitis 03/11/2013  . Pain in right shoulder 03/11/2013  . IBS (irritable bowel syndrome) - with chronic recurrent abdominal pain 02/01/2012  . Personal history of colonic polyps - adenoma 02/01/2012    Illias Pantano,CHRIS, PTA 10/25/2020, 1:46 PM  [facility] [address] [place], [place], [address] Phone: [phone]   Fax:  [phone]  Name: Shelly Bennett MRN: [id number] Date of Birth: [birth year]

## 2020-10-27 ENCOUNTER — Ambulatory Visit: Payer: Medicare PPO | Admitting: Physical Therapy

## 2020-10-27 ENCOUNTER — Other Ambulatory Visit: Payer: Self-pay

## 2020-10-27 DIAGNOSIS — R2681 Unsteadiness on feet: Secondary | ICD-10-CM | POA: Diagnosis not present

## 2020-10-27 DIAGNOSIS — R262 Difficulty in walking, not elsewhere classified: Secondary | ICD-10-CM

## 2020-10-27 DIAGNOSIS — M6281 Muscle weakness (generalized): Secondary | ICD-10-CM | POA: Diagnosis not present

## 2020-10-27 NOTE — Therapy (Signed)
[facility] [place], [place], [address] Phone: [phone]   Fax:  [phone]  Physical Therapy Treatment  Patient Details  Name: Shelly Bennett MRN: [id number] Date of Birth: 09/30/1951 Referring Provider (PT): Caryl Pina, MD   Encounter Date: 10/27/2020   PT End of Session - 10/27/20 1128    Visit Number 5    Number of Visits 12    Date for PT Re-Evaluation 12/01/20    Authorization Type [employer] Medicare (CQ modifier), Progress note every 10th visit, KX modifer at 15th visit.    PT Start Time 1030    PT Stop Time 1117    PT Time Calculation (min) 47 min    Activity Tolerance Patient tolerated treatment well    Behavior During Therapy WFL for tasks assessed/performed           Past Medical History:  Diagnosis Date   Adenomatous colon polyp    Allergic drug reaction 06/25/2015   Allergy    Anxiety    Arthritis    At risk for falls    fallen 4 times recently    CAD (coronary artery disease)    Dr. 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   Subjective Assessment - 10/27/20 1127    Subjective COVID-19 screening performed upon arrival. Patient reported no new complaints.    Pertinent History HTN, CAD, Aortic valve stenosis, Osteopenia    Limitations Lifting;Standing;Walking;House hold activities    Diagnostic tests n/a    Patient Stated Goals improve balance and walking, be more stable when coming to standing.    Currently in Pain? Yes   did not provide number on pain scale             [facility] PT Assessment - 10/27/20 0001      Assessment   Medical Diagnosis Balance disorder, recurrent falls    Referring Provider (PT) Caryl Pina, MD    Next MD Visit "6 months"    Prior Therapy yes      Precautions   Precautions Fall      Restrictions   Weight Bearing Restrictions No                         [facility] Adult PT Treatment/Exercise - 10/27/20 0001  Exercises   Exercises Knee/Hip;Lumbar      Lumbar Exercises: Seated   Other Seated Lumbar Exercises trunk rotation with silver ball x20 no LE support      Knee/Hip Exercises: Aerobic   Nustep L3 x63min UE/LE activity      Knee/Hip Exercises: Standing   Heel Raises Both;2 sets;20 reps    Heel Raises Limitations Toe Raises 2x10    Rocker Board 3 minutes      Knee/Hip Exercises: Seated   Long Arc Quad Strengthening;Both;2 sets;10 reps    Long Arc Quad Weight 2 lbs.    Marching Strengthening;Both;2 sets;10 reps    Marching Weights 2 lbs.    Sit to Sand 10 reps;without UE support               Balance Exercises - 10/27/20 0001      Balance Exercises: Standing   Standing Eyes Opened Narrow base of support (BOS);Foam/compliant surface;Head turns;Cognitive challenge;4 reps   1 min each   Standing, One Foot on a Step Eyes open;8 inch;4 reps;Time   1 min each with reaching   Balance Beam lateral stepping on balance beam x3 mins      Balance Exercises: Seated    Dynamic Sitting Eyes opened;No lower extremity support;Anterior/posterior weight shift;Lateral weight shift;Reaching outside base of support   2x10 reaching bilaterally                 PT Long Term Goals - 10/20/20 0959      PT LONG TERM GOAL #1   Title Patient will be independent with HEP    Time 6    Period Weeks    Status On-going      PT LONG TERM GOAL #2   Title Patient will decrease risk of falls as noted by the ability to perform modified 5x sit to stand with bilateral UE support in 18 seconds or less.    Baseline unable to test due to left knee pain 10/20/20    Time 6    Period Weeks    Status On-going      PT LONG TERM GOAL #3   Title Patient will demonstrate a 3+ point improvement on Berg Balance Scale to decrease risk of falls.    Baseline NT 10/20/20    Time 6    Period Weeks    Status On-going      PT LONG TERM GOAL #4   Title Patient will demonstrate 4+/5 or greater bilateral LE MMT to improve stability during functional tasks.    Baseline limited stretch training today due to left knee pain 10/20/20    Time 6    Period Weeks    Status On-going                 Plan - 10/27/20 1130    Clinical Impression Statement Patient responded well to therapy session but with reports of fatigue. Patient guided through balance activities with stand by assist and intermittent UE support. Patient noted with good use of ankle strategies to maintain balance. Patient provided with rest breaks secondary to fatigue but overall reported feeling good at the end of the session.    Comorbidities HTN, CAD, Aortic valve stenosis, Osteopenia    Examination-Activity Limitations Locomotion Level;Transfers;Stand;Dressing    Examination-Participation Restrictions Cleaning    Stability/Clinical Decision Making Stable/Uncomplicated    Clinical Decision Making Low    Rehab Potential Good    PT Frequency 2x / week    PT Duration 6 weeks  PT Treatment/Interventions ADLs/Self Care  Home Management;Gait training;Stair training;Functional mobility training;Therapeutic activities;Therapeutic exercise;Balance training;Neuromuscular re-education;Manual techniques;Passive range of motion;Patient/family education    PT Next Visit Plan monitor left knee pain and cont with nustep, LE strengthening and stretching, balance activities, progress to dynamic balance activities.    PT Home Exercise Plan see patient education.    Consulted and Agree with Plan of Care Patient           Patient will benefit from skilled therapeutic intervention in order to improve the following deficits and impairments:  Abnormal gait, Decreased activity tolerance, Decreased balance, Decreased strength, Difficulty walking, Decreased range of motion, Pain, Postural dysfunction  Visit Diagnosis: Unsteadiness on feet  Muscle weakness (generalized)  Difficulty in walking, not elsewhere classified     Problem List Patient Active Problem List   Diagnosis Date Noted   Spinal stenosis of lumbar region 06/16/2020   Gait abnormality 03/10/2020   Chest pain 02/17/2020   Gastroesophageal reflux disease 10/19/2019   Hypertensive disorder 10/19/2019   Third degree uterine prolapse 10/19/2019   Spinal stenosis of lumbar region without neurogenic claudication 09/15/2019   Urinary and fecal incontinence 09/15/2019   Cystocele with rectocele 09/15/2019   Radial styloid tenosynovitis (de quervain) 06/24/2019   Trigger finger, right middle finger 06/24/2019   Pain in left wrist 05/27/2019   Trigger thumb, right thumb 10/29/2018   Memory loss 07/22/2018   Low back pain 07/22/2018   Left lumbar radiculopathy 01/22/2018   Mild cognitive impairment 10/22/2017   Anxiety 10/22/2017   Aortic atherosclerosis ([place]) 12/04/2016   Ptosis of right eyelid 02/01/2016   Dermatochalasis of both upper eyelids 03/07/2015   Vitamin D deficiency 08/19/2014   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 99/24/2683   Family history of colon cancer-father at 58+ grandparent w/ rectal cancer 01/14/2014   MGD (meibomian gland dysfunction) 10/14/2013   Injection of surface of eye 10/14/2013   Osteopenia 08/12/2013   CAD (coronary artery disease), native coronary artery 03/22/2013   Aortic valve stenosis, mild 03/22/2013   Hyperlipemia 03/11/2013   Hypertension 03/11/2013   Allergic rhinitis 03/11/2013   Pain in right shoulder 03/11/2013   IBS (irritable bowel syndrome) - with chronic recurrent abdominal pain 02/01/2012   Personal history of colonic polyps - adenoma 02/01/2012    Gabriela Eves, PT, DPT 10/27/2020, 11:40 AM  [facility] [address] [place], [place], [address] Phone: [phone]   Fax:  [phone]  Name: Shelly Bennett MRN: [id number] Date of Birth: 07-May-1951

## 2020-11-01 ENCOUNTER — Ambulatory Visit: Payer: Medicare PPO | Admitting: *Deleted

## 2020-11-01 ENCOUNTER — Other Ambulatory Visit: Payer: Self-pay

## 2020-11-01 DIAGNOSIS — M6281 Muscle weakness (generalized): Secondary | ICD-10-CM

## 2020-11-01 DIAGNOSIS — R2681 Unsteadiness on feet: Secondary | ICD-10-CM

## 2020-11-01 DIAGNOSIS — R262 Difficulty in walking, not elsewhere classified: Secondary | ICD-10-CM

## 2020-11-01 NOTE — Therapy (Signed)
[facility] [place], [place], [address] Phone: [phone]   Fax:  [phone]  Physical Therapy Treatment  Patient Details  Name: Shelly Bennett MRN: [id number] Date of Birth: [birth year] Referring Provider (PT): Caryl Pina, MD   Encounter Date: 11/01/2020   PT End of Session - 11/01/20 1135    Visit Number 6    Number of Visits 12    Date for PT Re-Evaluation 12/01/20    Authorization Type [employer] Medicare (CQ modifier), Progress note every 10th visit, KX modifer at 15th visit.    PT Start Time 1030    PT Stop Time 1120    PT Time Calculation (min) 50 min           Past Medical History:  Diagnosis Date  . Adenomatous colon polyp   . Allergic drug reaction 06/25/2015  . Allergy   . Anxiety   . Arthritis   . At risk for falls    fallen 4 times recently   . CAD (coronary artery disease)    Dr. Burt Knack follows   . Carpal tunnel syndrome   . Chronic headaches   . Coccydynia 02/01/2012  . Cystocele   . Diverticulosis   . External hemorrhoids   . Focal nodular hyperplasia of liver   . Gait abnormality   . GERD (gastroesophageal reflux disease)    some  . HCAP (healthcare-associated pneumonia) 06/25/2015  . Heart murmur   . HLD (hyperlipidemia)   . HTN (hypertension)   . IBS (irritable bowel syndrome)   . Memory loss   . Neuromuscular disorder ([place])    some neuropathy issues in the past- legs fibromyalgia in past-- past hx of steroid injections  . Obesity   . Osteoporosis   . Otitis of left ear 02/01/2017  . Pneumonia   . Skin cancer    squamous cell - face    Past Surgical History:  Procedure Laterality Date  . CARPAL TUNNEL RELEASE    . COLONOSCOPY  12/15/2008   diverticulosis, external hemorrhoids  . KNEE SURGERY  05/22/2015   right  . LAPAROSCOPY    . SKIN SURGERY     squamous cell - right face     There were no vitals filed for this visit.   Subjective Assessment - 11/01/20 1048     Subjective COVID-19 screening performed upon arrival. Patient reported no new complaints and doing better    Pertinent History HTN, CAD, Aortic valve stenosis, Osteopenia    Limitations Lifting;Standing;Walking;House hold activities    Diagnostic tests n/a    Patient Stated Goals improve balance and walking, be more stable when coming to standing.    Currently in Pain? No/denies                             [facility] Adult PT Treatment/Exercise - 11/01/20 0001      Exercises   Exercises Knee/Hip;Lumbar      Knee/Hip Exercises: Aerobic   Nustep L3-4 x146min UE/LE activity      Knee/Hip Exercises: Standing   Heel Raises Both;2 sets;20 reps    Heel Raises Limitations Toe Raises 2x10    Rocker Board 3 minutes    SLS for each LE x 5    Other Standing Knee Exercises side stepping on balance beam    Other Standing Knee Exercises one step holds on balance beam alternating frontt/back foot      Knee/Hip Exercises: Seated  Long Arc [employer] Strengthening;Both;10 reps;3 sets    [employer] 2 lbs.    Sit to Sand 10 reps;without UE support                       PT Long Term Goals - 10/20/20 0959      PT LONG TERM GOAL #1   Title Patient will be independent with HEP    Time 6    Period Weeks    Status On-going      PT LONG TERM GOAL #2   Title Patient will decrease risk of falls as noted by the ability to perform modified 5x sit to stand with bilateral UE support in 18 seconds or less.    Baseline unable to test due to left knee pain 10/20/20    Time 6    Period Weeks    Status On-going      PT LONG TERM GOAL #3   Title Patient will demonstrate a 3+ point improvement on Berg Balance Scale to decrease risk of falls.    Baseline NT 10/20/20    Time 6    Period Weeks    Status On-going      PT LONG TERM GOAL #4   Title Patient will demonstrate 4+/5 or greater bilateral LE MMT to improve stability during functional tasks.    Baseline limited  stretch training today due to left knee pain 10/20/20    Time 6    Period Weeks    Status On-going                 Plan - 11/01/20 1111    Clinical Impression Statement Pt arrived today reporting doing better with balance at home/ ADLs.  Rx focused on balance today with progression as well as strengthening for LE's. Pt experienced some LT knee pain during Rx, but short lived.    Comorbidities HTN, CAD, Aortic valve stenosis, Osteopenia    Examination-Participation Restrictions Cleaning    Stability/Clinical Decision Making Stable/Uncomplicated    Rehab Potential Good    PT Frequency 2x / week    PT Treatment/Interventions ADLs/Self Care Home Management;Gait training;Stair training;Functional mobility training;Therapeutic activities;Therapeutic exercise;Balance training;Neuromuscular re-education;Manual techniques;Passive range of motion;Patient/family education    PT Next Visit Plan monitor left knee pain and cont with nustep, LE strengthening and stretching, balance activities, progress to dynamic balance activities.    Consulted and Agree with Plan of Care Patient           Patient will benefit from skilled therapeutic intervention in order to improve the following deficits and impairments:  Abnormal gait, Decreased activity tolerance, Decreased balance, Decreased strength, Difficulty walking, Decreased range of motion, Pain, Postural dysfunction  Visit Diagnosis: Unsteadiness on feet  Muscle weakness (generalized)  Difficulty in walking, not elsewhere classified     Problem List Patient Active Problem List   Diagnosis Date Noted  . Spinal stenosis of lumbar region 06/16/2020  . Gait abnormality 03/10/2020  . Chest pain 02/17/2020  . Gastroesophageal reflux disease 10/19/2019  . Hypertensive disorder 10/19/2019  . Third degree uterine prolapse 10/19/2019  . Spinal stenosis of lumbar region without neurogenic claudication 09/15/2019  . Urinary and fecal incontinence  09/15/2019  . Cystocele with rectocele 09/15/2019  . Radial styloid tenosynovitis (de quervain) 06/24/2019  . Trigger finger, right middle finger 06/24/2019  . Pain in left wrist 05/27/2019  . Trigger thumb, right thumb 10/29/2018  . Memory loss 07/22/2018  . Low back pain  07/22/2018  . Left lumbar radiculopathy 01/22/2018  . Mild cognitive impairment 10/22/2017  . Anxiety 10/22/2017  . Aortic atherosclerosis ([place]) 12/04/2016  . Ptosis of right eyelid 02/01/2016  . Dermatochalasis of both upper eyelids 03/07/2015  . Vitamin D deficiency 08/19/2014  . 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 Family history of colon cancer-father at 27+ grandparent w/ rectal cancer 01/14/2014  . MGD (meibomian gland dysfunction) 10/14/2013  . Injection of surface of eye 10/14/2013  . Osteopenia 08/12/2013  . CAD (coronary artery disease), native coronary artery 03/22/2013  . Aortic valve stenosis, mild 03/22/2013  . Hyperlipemia 03/11/2013  . Hypertension 03/11/2013  . Allergic rhinitis 03/11/2013  . Pain in right shoulder 03/11/2013  . IBS (irritable bowel syndrome) - with chronic recurrent abdominal pain 02/01/2012  . Personal history of colonic polyps - adenoma 02/01/2012    Shelly Bennett,CHRIS, PTA 11/01/2020, 11:35 AM  [facility] [place], [place], [address] Phone: [phone]   Fax:  [phone]  Name: Lilleigh Hechavarria MRN: [id number] Date of Birth: 1951/04/26

## 2020-11-03 ENCOUNTER — Other Ambulatory Visit: Payer: Self-pay

## 2020-11-03 ENCOUNTER — Ambulatory Visit: Payer: Medicare PPO | Admitting: Physical Therapy

## 2020-11-03 ENCOUNTER — Encounter: Payer: Self-pay | Admitting: Physical Therapy

## 2020-11-03 DIAGNOSIS — R2681 Unsteadiness on feet: Secondary | ICD-10-CM | POA: Diagnosis not present

## 2020-11-03 DIAGNOSIS — R262 Difficulty in walking, not elsewhere classified: Secondary | ICD-10-CM

## 2020-11-03 DIAGNOSIS — M6281 Muscle weakness (generalized): Secondary | ICD-10-CM | POA: Diagnosis not present

## 2020-11-03 NOTE — Therapy (Signed)
[facility] [place], [place], [address] Phone: [phone]   Fax:  [phone]  Physical Therapy Treatment  Patient Details  Name: Shelly Bennett MRN: [id number] Date of Birth: [birth year] Referring Provider (PT): Caryl Pina, MD   Encounter Date: 11/03/2020   PT End of Session - 11/03/20 1041    Visit Number 7    Number of Visits 12    Date for PT Re-Evaluation 12/01/20    Authorization Type [employer] Medicare (CQ modifier), Progress note every 10th visit, KX modifer at 15th visit.    PT Start Time 1035    PT Stop Time 1117    PT Time Calculation (min) 42 min    Activity Tolerance Patient tolerated treatment well    Behavior During Therapy WFL for tasks assessed/performed           Past Medical History:  Diagnosis Date  . Adenomatous colon polyp   . Allergic drug reaction 06/25/2015  . Allergy   . Anxiety   . Arthritis   . At risk for falls    fallen 4 times recently   . CAD (coronary artery disease)    Dr. Burt Knack follows   . Carpal tunnel syndrome   . Chronic headaches   . Coccydynia 02/01/2012  . Cystocele   . Diverticulosis   . External hemorrhoids   . Focal nodular hyperplasia of liver   . Gait abnormality   . GERD (gastroesophageal reflux disease)    some  . HCAP (healthcare-associated pneumonia) 06/25/2015  . Heart murmur   . HLD (hyperlipidemia)   . HTN (hypertension)   . IBS (irritable bowel syndrome)   . Memory loss   . 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 Obesity   . Osteoporosis   . Otitis of left ear 02/01/2017  . Pneumonia   . Skin cancer    squamous cell - face    Past Surgical History:  Procedure Laterality Date  . CARPAL TUNNEL RELEASE    . COLONOSCOPY  12/15/2008   diverticulosis, external hemorrhoids  . KNEE SURGERY  05/22/2015   right  . LAPAROSCOPY    . SKIN SURGERY      squamous cell - right face     There were no vitals filed for this visit.   Subjective Assessment - 11/03/20 1040    Subjective COVID-19 screening performed upon arrival. Patient reported no new complaints and doing better    Pertinent History HTN, CAD, Aortic valve stenosis, Osteopenia    Limitations Lifting;Standing;Walking;House hold activities    Diagnostic tests n/a    Patient Stated Goals improve balance and walking, be more stable when coming to standing.    Currently in Pain? No/denies              [facility] PT Assessment - 11/03/20 0001      Assessment   Medical Diagnosis Balance disorder, recurrent falls    Referring Provider (PT) Caryl Pina, MD    Next MD Visit "6 months"    Prior Therapy yes      Precautions   Precautions Fall      Restrictions   Weight Bearing Restrictions No                         [facility] Adult PT Treatment/Exercise - 11/03/20 0001      Knee/Hip Exercises: Aerobic  Nustep L4 x17 min      Knee/Hip Exercises: Standing   Heel Raises Both;2 sets;10 reps    Heel Raises Limitations Toe Raises 2x10    Forward Step Up Both;2 sets;10 reps;Hand Hold: 2;Step Height: 6"    Other Standing Knee Exercises B toe crunch x10 reps for intrinsic foot strengthening      Knee/Hip Exercises: Seated   Long Arc Quad Strengthening;Both;3 sets;10 reps;Weights    Long Arc Quad Weight 4 lbs.    Clamshell with TheraBand Red   x20 reps   Hamstring Curl Strengthening;Both;3 sets;10 reps;Limitations    Hamstring Limitations red theraband    Sit to Sand 15 reps;without UE support                       PT Long Term Goals - 10/20/20 0959      PT LONG TERM GOAL #1   Title Patient will be independent with HEP    Time 6    Period Weeks    Status On-going      PT LONG TERM GOAL #2   Title Patient will decrease risk of falls as noted by the ability to perform modified 5x sit to stand with bilateral UE support in 18 seconds or less.      Baseline unable to test due to left knee pain 10/20/20    Time 6    Period Weeks    Status On-going      PT LONG TERM GOAL #3   Title Patient will demonstrate a 3+ point improvement on Berg Balance Scale to decrease risk of falls.    Baseline NT 10/20/20    Time 6    Period Weeks    Status On-going      PT LONG TERM GOAL #4   Title Patient will demonstrate 4+/5 or greater bilateral LE MMT to improve stability during functional tasks.    Baseline limited stretch training today due to left knee pain 10/20/20    Time 6    Period Weeks    Status On-going                 Plan - 11/03/20 1220    Clinical Impression Statement Patient arrived in clinic already fairly fatigued but no other complaints. Patient progressed through some LE strengthening with rest breaks as needed. Patient instructed for B foot intrinsic strengthening for balance training. Patient reports difficulty with foot since previous fractures.    Comorbidities HTN, CAD, Aortic valve stenosis, Osteopenia    Examination-Activity Limitations Locomotion Level;Transfers;Stand;Dressing    Examination-Participation Restrictions Cleaning    Stability/Clinical Decision Making Stable/Uncomplicated    Rehab Potential Good    PT Frequency 2x / week    PT Duration 6 weeks    PT Treatment/Interventions ADLs/Self Care Home Management;Gait training;Stair training;Functional mobility training;Therapeutic activities;Therapeutic exercise;Balance training;Neuromuscular re-education;Manual techniques;Passive range of motion;Patient/family education    PT Next Visit Plan monitor left knee pain and cont with nustep, LE strengthening and stretching, balance activities, progress to dynamic balance activities.    PT Home Exercise Plan see patient education.    Consulted and Agree with Plan of Care Patient           Patient will benefit from skilled therapeutic intervention in order to improve the following deficits and impairments:   Abnormal gait, Decreased activity tolerance, Decreased balance, Decreased strength, Difficulty walking, Decreased range of motion, Pain, Postural dysfunction  Visit Diagnosis: Unsteadiness on feet  Muscle weakness (generalized)  Difficulty in walking, not elsewhere classified     Problem List Patient Active Problem List   Diagnosis Date Noted  . Spinal stenosis of lumbar region 06/16/2020  . Gait abnormality 03/10/2020  . Chest pain 02/17/2020  . Gastroesophageal reflux disease 10/19/2019  . Hypertensive disorder 10/19/2019  . Third degree uterine prolapse 10/19/2019  . Spinal stenosis of lumbar region without neurogenic claudication 09/15/2019  . Urinary and fecal incontinence 09/15/2019  . Cystocele with rectocele 09/15/2019  . Radial styloid tenosynovitis (de quervain) 06/24/2019  . Trigger finger, right middle finger 06/24/2019  . Pain in left wrist 05/27/2019  . Trigger thumb, right thumb 10/29/2018  . Memory loss 07/22/2018  . Low back pain 07/22/2018  . Left lumbar radiculopathy 01/22/2018  . Mild cognitive impairment 10/22/2017  . Anxiety 10/22/2017  . Aortic atherosclerosis ([place]) 12/04/2016  . Ptosis of right eyelid 02/01/2016  . Dermatochalasis of both upper eyelids 03/07/2015  . Vitamin D deficiency 08/19/2014  . 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 Family history of colon cancer-father at 73+ grandparent w/ rectal cancer 01/14/2014  . MGD (meibomian gland dysfunction) 10/14/2013  . Injection of surface of eye 10/14/2013  . Osteopenia 08/12/2013  . CAD (coronary artery disease), native coronary artery 03/22/2013  . Aortic valve stenosis, mild 03/22/2013  . Hyperlipemia 03/11/2013  . Hypertension 03/11/2013  . Allergic rhinitis 03/11/2013  . Pain in right shoulder 03/11/2013  . IBS (irritable bowel syndrome) - with chronic recurrent abdominal pain 02/01/2012  . Personal history of colonic polyps - adenoma 02/01/2012     Standley Brooking, PTA 11/03/2020, 12:23 PM  [facility] [address] [place], [place], [address] Phone: [phone]   Fax:  [phone]  Name: Emmanuela Ghazi MRN: [id number] Date of Birth: 1951/05/20

## 2020-11-08 ENCOUNTER — Other Ambulatory Visit: Payer: Self-pay

## 2020-11-08 ENCOUNTER — Ambulatory Visit: Payer: Medicare PPO | Admitting: *Deleted

## 2020-11-08 DIAGNOSIS — R262 Difficulty in walking, not elsewhere classified: Secondary | ICD-10-CM | POA: Diagnosis not present

## 2020-11-08 DIAGNOSIS — M6281 Muscle weakness (generalized): Secondary | ICD-10-CM

## 2020-11-08 DIAGNOSIS — R2681 Unsteadiness on feet: Secondary | ICD-10-CM | POA: Diagnosis not present

## 2020-11-08 NOTE — Therapy (Signed)
[facility] [place], [place], [address] Phone: [phone]   Fax:  [phone]  Physical Therapy Treatment  Patient Details  Name: Shelly Bennett MRN: [id number] Date of Birth: 1951/01/02 Referring Provider (PT): Caryl Pina, MD   Encounter Date: 11/08/2020   PT End of Session - 11/08/20 1111    Visit Number 8    Number of Visits 12    Date for PT Re-Evaluation 12/01/20    Authorization Type [employer] (CQ modifier), Progress note every 10th visit, KX modifer at 15th visit.    PT Start Time 1030    PT Stop Time 1117    PT Time Calculation (min) 47 min           Past Medical History:  Diagnosis Date  . Adenomatous colon polyp   . Allergic drug reaction 06/25/2015  . Allergy   . Anxiety   . Arthritis   . At risk for falls    fallen 4 times recently   . CAD (coronary artery disease)    Dr. Burt Knack follows   . Carpal tunnel syndrome   . Chronic headaches   . Coccydynia 02/01/2012  . Cystocele   . Diverticulosis   . External hemorrhoids   . Focal nodular hyperplasia of liver   . Gait abnormality   . GERD (gastroesophageal reflux disease)    some  . HCAP (healthcare-associated pneumonia) 06/25/2015  . Heart murmur   . HLD (hyperlipidemia)   . HTN (hypertension)   . IBS (irritable bowel syndrome)   . Memory loss   . 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 Obesity   . Osteoporosis   . Otitis of left ear 02/01/2017  . Pneumonia   . Skin cancer    squamous cell - face    Past Surgical History:  Procedure Laterality Date  . CARPAL TUNNEL RELEASE    . COLONOSCOPY  12/15/2008   diverticulosis, external hemorrhoids  . KNEE SURGERY  05/22/2015   right  . LAPAROSCOPY    . SKIN SURGERY     squamous cell - right face     There were no vitals filed for this visit.                       [place] Adult PT Treatment/Exercise - 11/08/20 0001      Knee/Hip Exercises: Aerobic   Nustep L4 x17 min      Knee/Hip Exercises: Standing   Heel Raises Both;2 sets;10 reps    Heel Raises Limitations Toe Raises 2x10    Forward Step Up Both;2 sets;10 reps;Hand Hold: 2;Step Height: 6"    Rocker Board 3 minutes    SLS for each LE x 5    Other Standing Knee Exercises side stepping on balance beam    Other Standing Knee Exercises one step holds on balance beam alternating frontt/back foot      Knee/Hip Exercises: Seated   Long Arc Quad Strengthening;Both;3 sets;10 reps;Weights    Long Arc Quad Weight 4 lbs.    Sit to Sand 5 reps;with UE support   16 secs                      PT Long Term Goals - 11/08/20 1148      PT LONG TERM GOAL #2   Title Patient will decrease risk of falls  as noted by the ability to perform modified 5x sit to stand with bilateral UE support in 18 seconds or less.    Baseline MET x 5 16 secs 11/08/20    Time 6    Period Weeks    Status Achieved                 Plan - 11/08/20 1127    Clinical Impression Statement Pt arrived today doing fairly well, but some RT knee pain. Rx focused on balance and LE strengthening and Pt did great and continues to improve. She was able to meet LTG sit to stand today. Others are ongoing.    Comorbidities HTN, CAD, Aortic valve stenosis, Osteopenia    Examination-Participation Restrictions Cleaning    Stability/Clinical Decision Making Stable/Uncomplicated    Rehab Potential Good    PT Frequency 2x / week    PT Duration 6 weeks    PT Treatment/Interventions ADLs/Self Care Home Management;Gait training;Stair training;Functional mobility training;Therapeutic activities;Therapeutic exercise;Balance training;Neuromuscular re-education;Manual techniques;Passive range of motion;Patient/family education    PT Next Visit Plan monitor left knee pain and cont with nustep, LE strengthening and stretching, balance  activities, progress to dynamic balance activities.    PT Home Exercise Plan see patient education.    Consulted and Agree with Plan of Care Patient           Patient will benefit from skilled therapeutic intervention in order to improve the following deficits and impairments:  Abnormal gait, Decreased activity tolerance, Decreased balance, Decreased strength, Difficulty walking, Decreased range of motion, Pain, Postural dysfunction  Visit Diagnosis: Unsteadiness on feet  Muscle weakness (generalized)  Difficulty in walking, not elsewhere classified     Problem List Patient Active Problem List   Diagnosis Date Noted  . Spinal stenosis of lumbar region 06/16/2020  . Gait abnormality 03/10/2020  . Chest pain 02/17/2020  . Gastroesophageal reflux disease 10/19/2019  . Hypertensive disorder 10/19/2019  . Third degree uterine prolapse 10/19/2019  . Spinal stenosis of lumbar region without neurogenic claudication 09/15/2019  . Urinary and fecal incontinence 09/15/2019  . Cystocele with rectocele 09/15/2019  . Radial styloid tenosynovitis (de quervain) 06/24/2019  . Trigger finger, right middle finger 06/24/2019  . Pain in left wrist 05/27/2019  . Trigger thumb, right thumb 10/29/2018  . Memory loss 07/22/2018  . Low back pain 07/22/2018  . Left lumbar radiculopathy 01/22/2018  . Mild cognitive impairment 10/22/2017  . Anxiety 10/22/2017  . Aortic atherosclerosis ([place]) 12/04/2016  . Ptosis of right eyelid 02/01/2016  . Dermatochalasis of both upper eyelids 03/07/2015  . Vitamin D deficiency 08/19/2014  . 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 Family history of colon cancer-father at 44+ grandparent w/ rectal cancer 01/14/2014  . MGD (meibomian gland dysfunction) 10/14/2013  . Injection of surface of eye 10/14/2013  . Osteopenia 08/12/2013  . CAD (coronary artery disease), native coronary artery 03/22/2013  . Aortic valve stenosis, mild  03/22/2013  . Hyperlipemia 03/11/2013  . Hypertension 03/11/2013  . Allergic rhinitis 03/11/2013  . Pain in right shoulder 03/11/2013  . IBS (irritable bowel syndrome) - with chronic recurrent abdominal pain 02/01/2012  . Personal history of colonic polyps - adenoma 02/01/2012    Tirso Laws,CHRIS, PTA 11/08/2020, 11:52 AM  [facility] [place], [place], [address] Phone: [phone]   Fax:  [phone]  Name: Sehaj Kolden MRN: [id number] Date of Birth: December 22, 1950

## 2020-11-16 DIAGNOSIS — H2513 Age-related nuclear cataract, bilateral: Secondary | ICD-10-CM | POA: Diagnosis not present

## 2020-11-16 DIAGNOSIS — H02403 Unspecified ptosis of bilateral eyelids: Secondary | ICD-10-CM | POA: Diagnosis not present

## 2020-11-18 ENCOUNTER — Ambulatory Visit: Payer: Medicare PPO | Attending: Family Medicine | Admitting: *Deleted

## 2020-11-18 ENCOUNTER — Other Ambulatory Visit: Payer: Self-pay

## 2020-11-18 DIAGNOSIS — R262 Difficulty in walking, not elsewhere classified: Secondary | ICD-10-CM | POA: Insufficient documentation

## 2020-11-18 DIAGNOSIS — R2681 Unsteadiness on feet: Secondary | ICD-10-CM | POA: Insufficient documentation

## 2020-11-18 DIAGNOSIS — M6281 Muscle weakness (generalized): Secondary | ICD-10-CM | POA: Insufficient documentation

## 2020-11-18 NOTE — Therapy (Signed)
[facility] [place], [place], [address] Phone: [phone]   Fax:  [phone]  Physical Therapy Treatment  Patient Details  Name: Shelly Bennett MRN: [id number] Date of Birth: 1951/05/13 Referring Provider (PT): Caryl Pina, MD   Encounter Date: 11/18/2020   PT End of Session - 11/18/20 1048    Visit Number 9    Number of Visits 12    Date for PT Re-Evaluation 12/01/20    Authorization Type [employer] Medicare (CQ modifier), Progress note every 10th visit, KX modifer at 15th visit.    PT Start Time 1042    PT Stop Time 1124    PT Time Calculation (min) 42 min           Past Medical History:  Diagnosis Date  . Adenomatous colon polyp   . Allergic drug reaction 06/25/2015  . Allergy   . Anxiety   . Arthritis   . At risk for falls    fallen 4 times recently   . CAD (coronary artery disease)    Dr. Burt Knack follows   . Carpal tunnel syndrome   . Chronic headaches   . Coccydynia 02/01/2012  . Cystocele   . Diverticulosis   . External hemorrhoids   . Focal nodular hyperplasia of liver   . Gait abnormality   . GERD (gastroesophageal reflux disease)    some  . HCAP (healthcare-associated pneumonia) 06/25/2015  . Heart murmur   . HLD (hyperlipidemia)   . HTN (hypertension)   . IBS (irritable bowel syndrome)   . Memory loss   . Neuromuscular disorder ([place])    some neuropathy issues in the past- legs fibromyalgia in past-- past hx of steroid injections  . Obesity   . Osteoporosis   . Otitis of left ear 02/01/2017  . Pneumonia   . Skin cancer    squamous cell - face    Past Surgical History:  Procedure Laterality Date  . CARPAL TUNNEL RELEASE    . COLONOSCOPY  12/15/2008   diverticulosis, external hemorrhoids  . KNEE SURGERY  05/22/2015   right  . LAPAROSCOPY    . SKIN SURGERY     squamous cell - right face     There were no vitals filed for this visit.                       [facility] Adult PT Treatment/Exercise - 11/18/20 0001      Knee/Hip Exercises: Aerobic   Nustep L4 x10 min      Knee/Hip Exercises: Standing   Heel Raises Both;2 sets;10 reps    Heel Raises Limitations Toe Raises 2x10    Forward Step Up Both;2 sets;10 reps;Hand Hold: 2;Step Height: 6"    Rocker Board 5 minutes    Other Standing Knee Exercises side stepping on balance beam    Other Standing Knee Exercises one step holds on balance beam alternating frontt/back foot      Knee/Hip Exercises: Seated   Long Arc Quad Strengthening;Both;3 sets;10 reps;Weights   pause 2-3 secs at top   [employer] 4 lbs.    Clamshell with TheraBand Red   3x10 reps              Balance Exercises - 11/18/20 0001      Balance Exercises: Standing   Standing Eyes Opened Narrow base of support (BOS);Foam/compliant surface;Head turns;Cognitive challenge;4 reps    Standing, One Foot on a Step Eyes open;8 inch;4 reps;Time  1 min each with reaching   Balance Beam lateral stepping on balance beam x3 mins                  PT Long Term Goals - 11/08/20 1148      PT LONG TERM GOAL #2   Title Patient will decrease risk of falls as noted by the ability to perform modified 5x sit to stand with bilateral UE support in 18 seconds or less.    Baseline MET x 5 16 secs 11/08/20    Time 6    Period Weeks    Status Achieved                 Plan - 11/18/20 1136    Clinical Impression Statement Pt arrived today doing fairly well, but having some LT knee pain. Rx focused on proprioception and LE strengthening and Pt reports she can tell the balnce exs are getting easier.    Comorbidities HTN, CAD, Aortic valve stenosis, Osteopenia    Examination-Activity Limitations Locomotion Level;Transfers;Stand;Dressing    Stability/Clinical Decision Making Stable/Uncomplicated    Rehab Potential Good    PT Frequency 2x / week    PT Duration 6 weeks    PT Treatment/Interventions ADLs/Self Care Home  Management;Gait training;Stair training;Functional mobility training;Therapeutic activities;Therapeutic exercise;Balance training;Neuromuscular re-education;Manual techniques;Passive range of motion;Patient/family education    PT Next Visit Plan monitor left knee pain and cont with nustep, LE strengthening and stretching, balance activities, progress to dynamic balance activities.    PT Home Exercise Plan see patient education.    Consulted and Agree with Plan of Care Patient           Patient will benefit from skilled therapeutic intervention in order to improve the following deficits and impairments:  Abnormal gait, Decreased activity tolerance, Decreased balance, Decreased strength, Difficulty walking, Decreased range of motion, Pain, Postural dysfunction  Visit Diagnosis: Unsteadiness on feet  Muscle weakness (generalized)  Difficulty in walking, not elsewhere classified     Problem List Patient Active Problem List   Diagnosis Date Noted  . Spinal stenosis of lumbar region 06/16/2020  . Gait abnormality 03/10/2020  . Chest pain 02/17/2020  . Gastroesophageal reflux disease 10/19/2019  . Hypertensive disorder 10/19/2019  . Third degree uterine prolapse 10/19/2019  . Spinal stenosis of lumbar region without neurogenic claudication 09/15/2019  . Urinary and fecal incontinence 09/15/2019  . Cystocele with rectocele 09/15/2019  . Radial styloid tenosynovitis (de quervain) 06/24/2019  . Trigger finger, right middle finger 06/24/2019  . Pain in left wrist 05/27/2019  . Trigger thumb, right thumb 10/29/2018  . Memory loss 07/22/2018  . Low back pain 07/22/2018  . Left lumbar radiculopathy 01/22/2018  . Mild cognitive impairment 10/22/2017  . Anxiety 10/22/2017  . Aortic atherosclerosis ([place]) 12/04/2016  . Ptosis of right eyelid 02/01/2016  . Dermatochalasis of both upper eyelids 03/07/2015  . Vitamin D deficiency 08/19/2014  . 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 37/90/2409  . Family history of colon cancer-father at 65+ grandparent w/ rectal cancer 01/14/2014  . MGD (meibomian gland dysfunction) 10/14/2013  . Injection of surface of eye 10/14/2013  . Osteopenia 08/12/2013  . CAD (coronary artery disease), native coronary artery 03/22/2013  . Aortic valve stenosis, mild 03/22/2013  . Hyperlipemia 03/11/2013  . Hypertension 03/11/2013  . Allergic rhinitis 03/11/2013  . Pain in right shoulder 03/11/2013  . IBS (irritable bowel syndrome) - with chronic recurrent abdominal pain 02/01/2012  . Personal history of colonic polyps -  adenoma 02/01/2012    Landri Dorsainvil,CHRIS, PTA 11/18/2020, 11:56 AM  [facility] [place], [place], [address] Phone: [phone]   Fax:  [phone]  Name: Shelly Bennett MRN: [id number] Date of Birth: 1951/10/21

## 2020-11-22 ENCOUNTER — Ambulatory Visit: Payer: Medicare PPO | Admitting: Physical Therapy

## 2020-11-22 ENCOUNTER — Encounter: Payer: Self-pay | Admitting: Physical Therapy

## 2020-11-22 ENCOUNTER — Other Ambulatory Visit: Payer: Self-pay

## 2020-11-22 DIAGNOSIS — R262 Difficulty in walking, not elsewhere classified: Secondary | ICD-10-CM | POA: Diagnosis not present

## 2020-11-22 DIAGNOSIS — M6281 Muscle weakness (generalized): Secondary | ICD-10-CM

## 2020-11-22 DIAGNOSIS — R2681 Unsteadiness on feet: Secondary | ICD-10-CM | POA: Diagnosis not present

## 2020-11-22 NOTE — Therapy (Signed)
[facility] [place], [place], [address] Phone: [phone]   Fax:  [phone]  Physical Therapy Treatment  Progress Note Reporting Period 10/282021 to 11/22/2020  See note below for Objective Data and Assessment of Progress/Goals.      Patient Details  Name: Shelly Bennett MRN: [id number] Date of Birth: 06/22/1951 Referring Provider (PT): Caryl Pina, MD   Encounter Date: 11/22/2020   PT End of Session - 11/22/20 1231    Visit Number 10    Number of Visits 12    Date for PT Re-Evaluation 12/01/20    Authorization Type [employer] Medicare (CQ modifier), Progress note every 10th visit, KX modifer at 15th visit.    PT Start Time 1032    PT Stop Time 1116    PT Time Calculation (min) 44 min    Activity Tolerance Patient tolerated treatment well    Behavior During Therapy WFL for tasks assessed/performed           Past Medical History:  Diagnosis Date  . Adenomatous colon polyp   . Allergic drug reaction 06/25/2015  . Allergy   . Anxiety   . Arthritis   . At risk for falls    fallen 4 times recently   . CAD (coronary artery disease)    Dr. Burt Knack follows   . Carpal tunnel syndrome   . Chronic headaches   . Coccydynia 02/01/2012  . Cystocele   . Diverticulosis   . External hemorrhoids   . Focal nodular hyperplasia of liver   . Gait abnormality   . GERD (gastroesophageal reflux disease)    some  . HCAP (healthcare-associated pneumonia) 06/25/2015  . Heart murmur   . HLD (hyperlipidemia)   . HTN (hypertension)   . IBS (irritable bowel syndrome)   . Memory loss   . Neuromuscular disorder ([place])    some neuropathy issues in the past- legs fibromyalgia in past-- past hx of steroid injections  . Obesity   . Osteoporosis   . Otitis of left ear 02/01/2017  . Pneumonia   . Skin cancer    squamous cell - face    Past Surgical History:  Procedure Laterality Date  . CARPAL TUNNEL RELEASE    .  COLONOSCOPY  12/15/2008   diverticulosis, external hemorrhoids  . KNEE SURGERY  05/22/2015   right  . LAPAROSCOPY    . SKIN SURGERY     squamous cell - right face     There were no vitals filed for this visit.   Subjective Assessment - 11/22/20 1052    Subjective COVID-19 screening performed upon arrival. Patient reported no new complaints but does have some issues with bending over to get something off the ground.    Pertinent History HTN, CAD, Aortic valve stenosis, Osteopenia    Limitations Lifting;Standing;Walking;House hold activities    Diagnostic tests n/a    Patient Stated Goals improve balance and walking, be more stable when coming to standing.    Currently in Pain? No/denies              [facility] PT Assessment - 11/22/20 0001      Assessment   Medical Diagnosis Balance disorder, recurrent falls    Referring Provider (PT) Caryl Pina, MD    Next MD Visit "6 months"    Prior Therapy yes      Precautions   Precautions Fall      Restrictions   Weight Bearing Restrictions No  [place] Adult PT Treatment/Exercise - 11/22/20 0001      Knee/Hip Exercises: Aerobic   Nustep L4 x15 min      Knee/Hip Exercises: Machines for Strengthening   Cybex Knee Extension 10# 3x10 reps    Cybex Knee Flexion 30# 3x10 reps      Knee/Hip Exercises: Seated   Clamshell with TheraBand Red   x20 reps   Sit to Sand 20 reps;without UE support   red theraband for hip abductor              Balance Exercises - 11/22/20 0001      Balance Exercises: Standing   Standing Eyes Opened Narrow base of support (BOS);Foam/compliant surface;Time;Cognitive challenge    Standing Eyes Opened Time x4 min; cognitive challenge with conversation and head turn for distraction    Tandem Stance Eyes open;Foam/compliant surface;Intermittent upper extremity support;Cognitive challenge   x3 min   Other Standing Exercises DLS on airex toe taps to 6" step x4 min                   PT Long Term Goals - 11/08/20 1148      PT LONG TERM GOAL #2   Title Patient will decrease risk of falls as noted by the ability to perform modified 5x sit to stand with bilateral UE support in 18 seconds or less.    Baseline MET x 5 16 secs 11/08/20    Time 6    Period Weeks    Status Achieved                 Plan - 11/22/20 1745    Clinical Impression Statement Patient presented in clinic with no new complaints. Patient slower with transfers today but able to tolerate strengthening well. More focus placed on hip abductors. Cognitive challenge of holding conversation while on uneven surface. Patient continues to show some ankle strategy exaggeration intermittantly with distraction and head turns.    Comorbidities HTN, CAD, Aortic valve stenosis, Osteopenia    Examination-Activity Limitations Locomotion Level;Transfers;Stand;Dressing    Examination-Participation Restrictions Cleaning    Stability/Clinical Decision Making Stable/Uncomplicated    Rehab Potential Good    PT Frequency 2x / week    PT Duration 6 weeks    PT Treatment/Interventions ADLs/Self Care Home Management;Gait training;Stair training;Functional mobility training;Therapeutic activities;Therapeutic exercise;Balance training;Neuromuscular re-education;Manual techniques;Passive range of motion;Patient/family education    PT Next Visit Plan monitor left knee pain and cont with nustep, LE strengthening and stretching, balance activities, progress to dynamic balance activities.    PT Home Exercise Plan see patient education.    Consulted and Agree with Plan of Care Patient           Patient will benefit from skilled therapeutic intervention in order to improve the following deficits and impairments:  Abnormal gait, Decreased activity tolerance, Decreased balance, Decreased strength, Difficulty walking, Decreased range of motion, Pain, Postural dysfunction  Visit Diagnosis: Unsteadiness on  feet  Muscle weakness (generalized)  Difficulty in walking, not elsewhere classified     Problem List Patient Active Problem List   Diagnosis Date Noted  . Spinal stenosis of lumbar region 06/16/2020  . Gait abnormality 03/10/2020  . Chest pain 02/17/2020  . Gastroesophageal reflux disease 10/19/2019  . Hypertensive disorder 10/19/2019  . Third degree uterine prolapse 10/19/2019  . Spinal stenosis of lumbar region without neurogenic claudication 09/15/2019  . Urinary and fecal incontinence 09/15/2019  . Cystocele with rectocele 09/15/2019  . Radial styloid tenosynovitis (de quervain) 06/24/2019  .  Trigger finger, right middle finger 06/24/2019  . Pain in left wrist 05/27/2019  . Trigger thumb, right thumb 10/29/2018  . Memory loss 07/22/2018  . Low back pain 07/22/2018  . Left lumbar radiculopathy 01/22/2018  . Mild cognitive impairment 10/22/2017  . Anxiety 10/22/2017  . Aortic atherosclerosis ([place]) 12/04/2016  . Ptosis of right eyelid 02/01/2016  . Dermatochalasis of both upper eyelids 03/07/2015  . Vitamin D deficiency 08/19/2014  . 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 Family history of colon cancer-father at 58+ grandparent w/ rectal cancer 01/14/2014  . MGD (meibomian gland dysfunction) 10/14/2013  . Injection of surface of eye 10/14/2013  . Osteopenia 08/12/2013  . CAD (coronary artery disease), native coronary artery 03/22/2013  . Aortic valve stenosis, mild 03/22/2013  . Hyperlipemia 03/11/2013  . Hypertension 03/11/2013  . Allergic rhinitis 03/11/2013  . Pain in right shoulder 03/11/2013  . IBS (irritable bowel syndrome) - with chronic recurrent abdominal pain 02/01/2012  . Personal history of colonic polyps - adenoma 02/01/2012    Standley Brooking, PTA 11/22/2020, 5:58 PM  [facility] [address] [place], [place], [address] Phone: [phone]   Fax:  [phone]  Name:  Shelly Bennett MRN: [id number] Date of Birth: [birth year]

## 2020-11-24 ENCOUNTER — Ambulatory Visit: Payer: Medicare PPO | Admitting: Physical Therapy

## 2020-11-24 ENCOUNTER — Encounter: Payer: Self-pay | Admitting: Physical Therapy

## 2020-11-24 ENCOUNTER — Other Ambulatory Visit: Payer: Self-pay

## 2020-11-24 ENCOUNTER — Other Ambulatory Visit: Payer: Self-pay | Admitting: Family Medicine

## 2020-11-24 DIAGNOSIS — R262 Difficulty in walking, not elsewhere classified: Secondary | ICD-10-CM

## 2020-11-24 DIAGNOSIS — M6281 Muscle weakness (generalized): Secondary | ICD-10-CM

## 2020-11-24 DIAGNOSIS — J301 Allergic rhinitis due to pollen: Secondary | ICD-10-CM

## 2020-11-24 DIAGNOSIS — R2681 Unsteadiness on feet: Secondary | ICD-10-CM | POA: Diagnosis not present

## 2020-11-24 NOTE — Therapy (Signed)
[facility] [place], [place], [address] Phone: [phone]   Fax:  [phone]  Physical Therapy Treatment  Patient Details  Name: Shelly Bennett MRN: [id number] Date of Birth: 02/21/1951 Referring Provider (PT): Caryl Pina, MD   Encounter Date: 11/24/2020   PT End of Session - 11/24/20 1047    Visit Number 11    Number of Visits 12    Date for PT Re-Evaluation 12/01/20    Authorization Type [employer] Medicare (CQ modifier), Progress note every 10th visit, KX modifer at 15th visit.    PT Start Time 1033    PT Stop Time 1113    PT Time Calculation (min) 40 min    Activity Tolerance Patient tolerated treatment well    Behavior During Therapy WFL for tasks assessed/performed           Past Medical History:  Diagnosis Date  . Adenomatous colon polyp   . Allergic drug reaction 06/25/2015  . Allergy   . Anxiety   . Arthritis   . At risk for falls    fallen 4 times recently   . CAD (coronary artery disease)    Dr. Burt Knack follows   . Carpal tunnel syndrome   . Chronic headaches   . Coccydynia 02/01/2012  . Cystocele   . Diverticulosis   . External hemorrhoids   . Focal nodular hyperplasia of liver   . Gait abnormality   . GERD (gastroesophageal reflux disease)    some  . HCAP (healthcare-associated pneumonia) 06/25/2015  . Heart murmur   . HLD (hyperlipidemia)   . HTN (hypertension)   . IBS (irritable bowel syndrome)   . Memory loss   . Neuromuscular disorder ([place])    some neuropathy issues in the past- legs fibromyalgia in past-- past hx of steroid injections  . Obesity   . Osteoporosis   . Otitis of left ear 02/01/2017  . Pneumonia   . Skin cancer    squamous cell - face    Past Surgical History:  Procedure Laterality Date  . CARPAL TUNNEL RELEASE    . COLONOSCOPY  12/15/2008   diverticulosis, external hemorrhoids  . KNEE SURGERY  05/22/2015   right  . LAPAROSCOPY    . SKIN SURGERY      squamous cell - right face     There were no vitals filed for this visit.   Subjective Assessment - 11/24/20 1041    Subjective COVID-19 screening performed upon arrival. hurting more in L hips due to arthritis.    Pertinent History HTN, CAD, Aortic valve stenosis, Osteopenia    Limitations Lifting;Standing;Walking;House hold activities    Diagnostic tests n/a    Patient Stated Goals improve balance and walking, be more stable when coming to standing.    Currently in Pain? Yes    Pain Score 3     Pain Location Hip    Pain Orientation Left    Pain Descriptors / Indicators Discomfort    Pain Type Chronic pain    Pain Onset More than a month ago    Pain Frequency Intermittent              [facility] PT Assessment - 11/24/20 0001      Assessment   Medical Diagnosis Balance disorder, recurrent falls    Referring Provider (PT) Caryl Pina, MD    Next MD Visit "6 months"    Prior Therapy yes      Precautions   Precautions Fall  Restrictions   Weight Bearing Restrictions No                         [facility] Adult PT Treatment/Exercise - 11/24/20 0001      Knee/Hip Exercises: Aerobic   Nustep L3 x10 min      Knee/Hip Exercises: Machines for Strengthening   Cybex Knee Extension 10# 2x10 reps    Cybex Knee Flexion 30# 2x10 reps      Knee/Hip Exercises: Seated   Clamshell with TheraBand Green   x20 reps   Sit to Sand 15 reps;without UE support               Balance Exercises - 11/24/20 0001      Balance Exercises: Standing   Tandem Stance Eyes open;1 rep;Time    Tandem Stance Limitations x1 min    SLS Eyes open;Solid surface;Intermittent upper extremity support;3 reps;Time    SLS Time max holds    SLS with Vectors Solid surface;5 reps;Limitations    SLS with Vectors Limitations forward balance pod touches alternating, B side tapping    Retro Gait 5 reps    Sidestepping 5 reps    Cone Rotation Solid surface;R/L   high <> low surfaces   Heel  Raises Both;15 reps;Limitations    Heel Raises Limitations with glute squeeze    Toe Raise Both;15 reps;Limitations    Toe Raise Limitations with glute squeeze    Other Standing Exercises 2# ball squat for balance with glute squeeze x15 reps off 14# step                  PT Long Term Goals - 11/08/20 1148      PT LONG TERM GOAL #2   Title Patient will decrease risk of falls as noted by the ability to perform modified 5x sit to stand with bilateral UE support in 18 seconds or less.    Baseline MET x 5 16 secs 11/08/20    Time 6    Period Weeks    Status Achieved                 Plan - 11/24/20 1128    Clinical Impression Statement Patient presented in clinic with greater L hip pain and R knee pain as well. Patient able to tolerate light strengthening well with lighter reps due to hip/knee pain. Patient progressed to more advanced and functional balance training. Patient noted greater LLE weakness with SLS activities as well as trendelenberg stance.    Comorbidities HTN, CAD, Aortic valve stenosis, Osteopenia    Examination-Activity Limitations Locomotion Level;Transfers;Stand;Dressing    Examination-Participation Restrictions Cleaning    Stability/Clinical Decision Making Stable/Uncomplicated    Rehab Potential Good    PT Frequency 2x / week    PT Duration 6 weeks    PT Treatment/Interventions ADLs/Self Care Home Management;Gait training;Stair training;Functional mobility training;Therapeutic activities;Therapeutic exercise;Balance training;Neuromuscular re-education;Manual techniques;Passive range of motion;Patient/family education    PT Next Visit Plan monitor left knee pain and cont with nustep, LE strengthening and stretching, balance activities, progress to dynamic balance activities.    PT Home Exercise Plan see patient education.    Consulted and Agree with Plan of Care Patient           Patient will benefit from skilled therapeutic intervention in order to  improve the following deficits and impairments:  Abnormal gait,Decreased activity tolerance,Decreased balance,Decreased strength,Difficulty walking,Decreased range of motion,Pain,Postural dysfunction  Visit Diagnosis: Unsteadiness on feet  Muscle  weakness (generalized)  Difficulty in walking, not elsewhere classified     Problem List Patient Active Problem List   Diagnosis Date Noted  . Spinal stenosis of lumbar region 06/16/2020  . Gait abnormality 03/10/2020  . Chest pain 02/17/2020  . Gastroesophageal reflux disease 10/19/2019  . Hypertensive disorder 10/19/2019  . Third degree uterine prolapse 10/19/2019  . Spinal stenosis of lumbar region without neurogenic claudication 09/15/2019  . Urinary and fecal incontinence 09/15/2019  . Cystocele with rectocele 09/15/2019  . Radial styloid tenosynovitis (de quervain) 06/24/2019  . Trigger finger, right middle finger 06/24/2019  . Pain in left wrist 05/27/2019  . Trigger thumb, right thumb 10/29/2018  . Memory loss 07/22/2018  . Low back pain 07/22/2018  . Left lumbar radiculopathy 01/22/2018  . Mild cognitive impairment 10/22/2017  . Anxiety 10/22/2017  . Aortic atherosclerosis ([place]) 12/04/2016  . Ptosis of right eyelid 02/01/2016  . Dermatochalasis of both upper eyelids 03/07/2015  . Vitamin D deficiency 08/19/2014  . 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 Family history of colon cancer-father at 48+ grandparent w/ rectal cancer 01/14/2014  . MGD (meibomian gland dysfunction) 10/14/2013  . Injection of surface of eye 10/14/2013  . Osteopenia 08/12/2013  . CAD (coronary artery disease), native coronary artery 03/22/2013  . Aortic valve stenosis, mild 03/22/2013  . Hyperlipemia 03/11/2013  . Hypertension 03/11/2013  . Allergic rhinitis 03/11/2013  . Pain in right shoulder 03/11/2013  . IBS (irritable bowel syndrome) - with chronic recurrent abdominal pain 02/01/2012  . Personal history of  colonic polyps - adenoma 02/01/2012    Standley Brooking, PTA 11/24/2020, 11:32 AM  [facility] [address] [place], [place], [address] Phone: [phone]   Fax:  [phone]  Name: Shelly Bennett MRN: [id number] Date of Birth: 1951-08-15

## 2020-11-29 ENCOUNTER — Other Ambulatory Visit: Payer: Self-pay

## 2020-11-29 ENCOUNTER — Ambulatory Visit: Payer: Medicare PPO | Admitting: Physical Therapy

## 2020-11-29 ENCOUNTER — Encounter: Payer: Self-pay | Admitting: Physical Therapy

## 2020-11-29 DIAGNOSIS — M6281 Muscle weakness (generalized): Secondary | ICD-10-CM | POA: Diagnosis not present

## 2020-11-29 DIAGNOSIS — R2681 Unsteadiness on feet: Secondary | ICD-10-CM

## 2020-11-29 DIAGNOSIS — R262 Difficulty in walking, not elsewhere classified: Secondary | ICD-10-CM | POA: Diagnosis not present

## 2020-11-29 NOTE — Therapy (Signed)
[facility] [place], [place], [address] Phone: [phone]   Fax:  [phone]  Physical Therapy Treatment  Patient Details  Name: Shelly Bennett MRN: [id number] Date of Birth: [birth year] Referring Provider (PT): Caryl Pina, MD   Encounter Date: 11/29/2020   PT End of Session - 11/29/20 1218    Visit Number 12    Number of Visits 12    Date for PT Re-Evaluation 12/01/20    Authorization Type [employer] Medicare (CQ modifier), Progress note every 10th visit, KX modifer at 15th visit.    PT Start Time 1124    PT Stop Time 1207    PT Time Calculation (min) 43 min    Activity Tolerance Patient tolerated treatment well    Behavior During Therapy WFL for tasks assessed/performed           Past Medical History:  Diagnosis Date  . Adenomatous colon polyp   . Allergic drug reaction 06/25/2015  . Allergy   . Anxiety   . Arthritis   . At risk for falls    fallen 4 times recently   . CAD (coronary artery disease)    Dr. Burt Knack follows   . Carpal tunnel syndrome   . Chronic headaches   . Coccydynia 02/01/2012  . Cystocele   . Diverticulosis   . External hemorrhoids   . Focal nodular hyperplasia of liver   . Gait abnormality   . GERD (gastroesophageal reflux disease)    some  . HCAP (healthcare-associated pneumonia) 06/25/2015  . Heart murmur   . HLD (hyperlipidemia)   . HTN (hypertension)   . IBS (irritable bowel syndrome)   . Memory loss   . Neuromuscular disorder ([place])    some neuropathy issues in the past- legs fibromyalgia in past-- past hx of steroid injections  . Obesity   . Osteoporosis   . Otitis of left ear 02/01/2017  . Pneumonia   . Skin cancer    squamous cell - face    Past Surgical History:  Procedure Laterality Date  . CARPAL TUNNEL RELEASE    . COLONOSCOPY  12/15/2008   diverticulosis, external hemorrhoids  . KNEE SURGERY  05/22/2015   right  . LAPAROSCOPY    . SKIN SURGERY      squamous cell - right face     There were no vitals filed for this visit.   Subjective Assessment - 11/29/20 1140    Subjective COVID-19 screen performed prior to patient entering clinic. Feel like balance is getting better.    Patient is accompained by: Interpreter    Pertinent History HTN, CAD, Aortic valve stenosis, Osteopenia    Limitations Lifting;Standing;Walking;House hold activities    Diagnostic tests n/a    Patient Stated Goals improve balance and walking, be more stable when coming to standing.    Pain Onset More than a month ago                             [facility] Adult PT Treatment/Exercise - 11/29/20 0001      Exercises   Exercises Knee/Hip      Lumbar Exercises: Aerobic   Nustep Level 3 x 10 minutes.      Lumbar Exercises: Standing   Other Standing Lumbar Exercises Blue XTS walking 4 way 2 minutes each direction (8 minutes total).    Other Standing Lumbar Exercises Standing UBE with knees in slight flexion for balance x 7 minutes.  Knee/Hip Exercises: Machines for Strengthening   Cybex Knee Extension 10# x 3 minutes.    Cybex Knee Flexion 30# x 3 minutes.    Cybex Leg Press 1 plate x 3 minutes.      Knee/Hip Exercises: Standing   Other Standing Knee Exercises Rockerboard side to side (2 minutes) and forward and back (2 minutes) 4 minutes total for balance.                       PT Long Term Goals - 11/29/20 1219      PT LONG TERM GOAL #1   Title Patient will be independent with HEP    Time 6    Period Weeks    Status Achieved      PT LONG TERM GOAL #2   Title Patient will decrease risk of falls as noted by the ability to perform modified 5x sit to stand with bilateral UE support in 18 seconds or less.    Baseline MET x 5 16 secs 11/08/20    Time 6    Period Weeks    Status Achieved      PT LONG TERM GOAL #3   Title Patient will demonstrate a 3+ point improvement on Berg Balance Scale to decrease risk of falls.     Baseline NT    Time 6    Period Weeks      PT LONG TERM GOAL #4   Title Patient will demonstrate 4+/5 or greater bilateral LE MMT to improve stability during functional tasks.    Time 6    Period Weeks    Status Achieved                  Patient will benefit from skilled therapeutic intervention in order to improve the following deficits and impairments:     Visit Diagnosis: Unsteadiness on feet  Muscle weakness (generalized)  Difficulty in walking, not elsewhere classified     Problem List Patient Active Problem List   Diagnosis Date Noted  . Spinal stenosis of lumbar region 06/16/2020  . Gait abnormality 03/10/2020  . Chest pain 02/17/2020  . Gastroesophageal reflux disease 10/19/2019  . Hypertensive disorder 10/19/2019  . Third degree uterine prolapse 10/19/2019  . Spinal stenosis of lumbar region without neurogenic claudication 09/15/2019  . Urinary and fecal incontinence 09/15/2019  . Cystocele with rectocele 09/15/2019  . Radial styloid tenosynovitis (de quervain) 06/24/2019  . Trigger finger, right middle finger 06/24/2019  . Pain in left wrist 05/27/2019  . Trigger thumb, right thumb 10/29/2018  . Memory loss 07/22/2018  . Low back pain 07/22/2018  . Left lumbar radiculopathy 01/22/2018  . Mild cognitive impairment 10/22/2017  . Anxiety 10/22/2017  . Aortic atherosclerosis ([place]) 12/04/2016  . Ptosis of right eyelid 02/01/2016  . Dermatochalasis of both upper eyelids 03/07/2015  . Vitamin D deficiency 08/19/2014  . 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 Family history of colon cancer-father at 28+ grandparent w/ rectal cancer 01/14/2014  . MGD (meibomian gland dysfunction) 10/14/2013  . Injection of surface of eye 10/14/2013  . Osteopenia 08/12/2013  . CAD (coronary artery disease), native coronary artery 03/22/2013  . Aortic valve stenosis, mild 03/22/2013  . Hyperlipemia 03/11/2013  . Hypertension 03/11/2013   . Allergic rhinitis 03/11/2013  . Pain in right shoulder 03/11/2013  . IBS (irritable bowel syndrome) - with chronic recurrent abdominal pain 02/01/2012  . Personal history of colonic polyps - adenoma 02/01/2012  PHYSICAL THERAPY DISCHARGE SUMMARY  Visits from Start of Care: 12.  Current functional level related to goals / functional outcomes: See above.   Remaining deficits: See goal section.   Education / Equipment: HEP. Plan: Patient agrees to discharge.  Patient goals were partially met. Patient is being discharged due to being pleased with the current functional level.  ?????      Crissie Aloi, [place] MPT 11/29/2020, 12:24 PM  [facility] [address] [place], [place], [address] Phone: [phone]   Fax:  [phone]  Name: Shelly Bennett MRN: [id number] Date of Birth: [birth year]

## 2020-12-06 DIAGNOSIS — L708 Other acne: Secondary | ICD-10-CM | POA: Diagnosis not present

## 2020-12-06 DIAGNOSIS — L578 Other skin changes due to chronic exposure to nonionizing radiation: Secondary | ICD-10-CM | POA: Diagnosis not present

## 2020-12-06 DIAGNOSIS — Z85828 Personal history of other malignant neoplasm of skin: Secondary | ICD-10-CM | POA: Diagnosis not present

## 2020-12-06 DIAGNOSIS — L821 Other seborrheic keratosis: Secondary | ICD-10-CM | POA: Diagnosis not present

## 2020-12-06 DIAGNOSIS — D229 Melanocytic nevi, unspecified: Secondary | ICD-10-CM | POA: Diagnosis not present

## 2020-12-06 DIAGNOSIS — D1801 Hemangioma of skin and subcutaneous tissue: Secondary | ICD-10-CM | POA: Diagnosis not present

## 2020-12-06 DIAGNOSIS — L905 Scar conditions and fibrosis of skin: Secondary | ICD-10-CM | POA: Diagnosis not present

## 2020-12-06 DIAGNOSIS — D225 Melanocytic nevi of trunk: Secondary | ICD-10-CM | POA: Diagnosis not present

## 2020-12-06 DIAGNOSIS — L820 Inflamed seborrheic keratosis: Secondary | ICD-10-CM | POA: Diagnosis not present

## 2020-12-14 ENCOUNTER — Other Ambulatory Visit: Payer: Self-pay | Admitting: Obstetrics and Gynecology

## 2020-12-14 DIAGNOSIS — N644 Mastodynia: Secondary | ICD-10-CM | POA: Diagnosis not present

## 2021-01-20 ENCOUNTER — Ambulatory Visit
Admission: RE | Admit: 2021-01-20 | Discharge: 2021-01-20 | Disposition: A | Discharge status: Home / Self Care | Payer: Medicare PPO | Source: Ambulatory Visit | Attending: Obstetrics and Gynecology | Admitting: Obstetrics and Gynecology

## 2021-01-20 ENCOUNTER — Other Ambulatory Visit: Payer: Self-pay

## 2021-01-20 ENCOUNTER — Ambulatory Visit: Payer: Medicare PPO

## 2021-01-20 DIAGNOSIS — R928 Other abnormal and inconclusive findings on diagnostic imaging of breast: Secondary | ICD-10-CM | POA: Diagnosis not present

## 2021-01-20 DIAGNOSIS — N644 Mastodynia: Secondary | ICD-10-CM

## 2021-03-03 ENCOUNTER — Other Ambulatory Visit: Payer: Self-pay | Admitting: Family Medicine

## 2021-03-03 DIAGNOSIS — J301 Allergic rhinitis due to pollen: Secondary | ICD-10-CM

## 2021-03-23 ENCOUNTER — Telehealth: Payer: Self-pay

## 2021-03-23 DIAGNOSIS — Q231 Congenital insufficiency of aortic valve: Secondary | ICD-10-CM

## 2021-03-23 NOTE — Telephone Encounter (Signed)
Scheduled echo and 1 year visit with Dr. Burt Knack 08/28/21.

## 2021-03-30 ENCOUNTER — Encounter: Payer: Self-pay | Admitting: Family Medicine

## 2021-03-30 ENCOUNTER — Ambulatory Visit: Payer: Medicare PPO | Admitting: Family Medicine

## 2021-03-30 ENCOUNTER — Other Ambulatory Visit: Payer: Self-pay

## 2021-03-30 VITALS — BP 138/74 | HR 53 | Ht 68.0 in | Wt 182.0 lb

## 2021-03-30 DIAGNOSIS — J301 Allergic rhinitis due to pollen: Secondary | ICD-10-CM

## 2021-03-30 DIAGNOSIS — F419 Anxiety disorder, unspecified: Secondary | ICD-10-CM | POA: Diagnosis not present

## 2021-03-30 DIAGNOSIS — I10 Essential (primary) hypertension: Secondary | ICD-10-CM | POA: Diagnosis not present

## 2021-03-30 DIAGNOSIS — E782 Mixed hyperlipidemia: Secondary | ICD-10-CM | POA: Diagnosis not present

## 2021-03-30 DIAGNOSIS — G3184 Mild cognitive impairment, so stated: Secondary | ICD-10-CM

## 2021-03-30 MED ORDER — FLUTICASONE PROPIONATE 50 MCG/ACT NA SUSP: 2.0000 | Freq: Every day | NASAL | 3 refills | Status: DC

## 2021-03-30 MED ORDER — MONTELUKAST SODIUM 10 MG PO TABS: 1.0000 | ORAL_TABLET | Freq: Every day | ORAL | 3 refills | Status: DC

## 2021-03-30 NOTE — Progress Notes (Signed)
BP 138/74   Pulse (!) 53   Ht _0  (1.727 m)   Wt 182 lb (82.6 kg)   SpO2 99%   BMI 27.67 kg/m    Subjective:   Patient ID: Shelly Bennett, female    DOB: January 06, 1951, 70 y.o.   MRN: [id number]  HPI: Shelly Bennett is a 70 y.o. female presenting on 03/30/2021 for Medical Management of Chronic Issues, Hyperlipidemia, and Hypertension   HPI Patient has anxiety and mild cognitive impairment.  She takes Aricept and amantadine and Wellbutrin to help with this.  She feels like they are doing well for her and denies any major issues.  Patient denies any suicidal ideations.  She says her memory is not improving but it is stable.  Patient takes Singulair for seasonal allergies along with Flonase and needs a refill for that, feels like is doing very well for her and not having any issues currently.  Hypertension Patient is currently on amlodipine and hydrochlorothiazide, and their blood pressure today is 138/74. Patient denies any lightheadedness or dizziness. Patient denies headaches, blurred vision, chest pains, shortness of breath, or weakness. Denies any side effects from medication and is content with current medication.   Hyperlipidemia Patient is coming in for recheck of his hyperlipidemia. The patient is currently taking Crestor and fish oils. They deny any issues with myalgias or history of liver damage from it. They deny any focal numbness or weakness or chest pain.   Relevant past medical, surgical, family and social history reviewed and updated as indicated. Interim medical history since our last visit reviewed. Allergies and medications reviewed and updated.  Review of Systems  Constitutional: Negative for chills and fever.  Eyes: Negative for visual disturbance.  Respiratory: Negative for chest tightness and shortness of breath.   Cardiovascular: Negative for chest pain and leg swelling.  Musculoskeletal: Negative for back pain and gait problem.  Skin: Negative  for rash.  Neurological: Negative for dizziness, light-headedness and headaches.  Psychiatric/Behavioral: Positive for confusion and decreased concentration. Negative for agitation, behavioral problems, dysphoric mood, self-injury and sleep disturbance. The patient is nervous/anxious.   All other systems reviewed and are negative.   Per HPI unless specifically indicated above   Allergies as of 03/30/2021      Reactions   Codeine    ? Reaction ER visit.    Mold Extract [trichophyton]    Migraine   Penicillins Other (See Comments)   "drew my legs up as child" Pt has tolerated cephalexin in the past.   Ace Inhibitors Cough   Angiotensin Receptor Blockers Cough   Levaquin [levofloxacin] Rash   Per notes from outpatient provider   Vytorin [ezetimibe-simvastatin] Other (See Comments)   cramping   Zetia [ezetimibe] Other (See Comments)   cramping      Medication List       Accurate as of March 30, 2021 10:40 AM. If you have any questions, ask your nurse or doctor.        amLODipine 10 MG tablet Commonly known as: NORVASC TAKE (1/2) TABLET DAILY.   aspirin 81 MG tablet Take 81 mg by mouth daily.   buPROPion 150 MG 24 hr tablet Commonly known as: Wellbutrin XL Take 1 tablet (150 mg total) by mouth daily.   CALCARB 600 PO Take by mouth.   donepezil 10 MG tablet Commonly known as: ARICEPT Take 10 mg by mouth at bedtime. Per patient taking 1/2 tablet   fexofenadine 180 MG tablet Commonly known as: ALLEGRA  Take 180 mg by mouth daily.   fluticasone 50 MCG/ACT nasal spray Commonly known as: FLONASE Place 2 sprays into both nostrils daily.   hydrochlorothiazide 25 MG tablet Commonly known as: HYDRODIURIL TAKE (1/2) TABLET DAILY.   HYDROcodone-acetaminophen 5-325 MG tablet Commonly known as: NORCO/VICODIN Take 1 tablet by mouth every 6 (six) hours as needed for moderate pain.   magic mouthwash w/lidocaine Soln Take 5 mLs by mouth 3 (three) times daily as needed for  mouth pain. Mixed with equal parts of diphenhydramine Maalox nystatin and viscous lidocaine   memantine 10 MG tablet Commonly known as: Namenda Take 1 tablet (10 mg total) by mouth 2 (two) times daily.   montelukast 10 MG tablet Commonly known as: SINGULAIR TAKE ONE TABLET AT BEDTIME   omega-3 acid ethyl esters 1 g capsule Commonly known as: LOVAZA Take 2 capsules (2 g total) by mouth 2 (two) times daily.   rosuvastatin 20 MG tablet Commonly known as: CRESTOR Take 1 tablet (20 mg total) by mouth daily.   Vitamin D-3 125 MCG (5000 UT) Tabs Take 1 capsule by mouth as directed. Take one capsule by mouth daily Mon thru Friday        Objective:   BP 138/74   Pulse (!) 53   Ht _0  (1.727 m)   Wt 182 lb (82.6 kg)   SpO2 99%   BMI 27.67 kg/m   Wt Readings from Last 3 Encounters:  03/30/21 182 lb (82.6 kg)  09/29/20 197 lb (89.4 kg)  08/24/20 198 lb 9.6 oz (90.1 kg)    Physical Exam Vitals and nursing note reviewed.  Constitutional:      General: She is not in acute distress.    Appearance: She is well-developed. She is not diaphoretic.  Eyes:     Conjunctiva/sclera: Conjunctivae normal.  Cardiovascular:     Rate and Rhythm: Normal rate and regular rhythm.     Heart sounds: Normal heart sounds. No murmur heard.   Pulmonary:     Effort: Pulmonary effort is normal. No respiratory distress.     Breath sounds: Normal breath sounds. No wheezing.  Abdominal:     General: Abdomen is flat. Bowel sounds are normal. There is no distension.     Tenderness: There is no abdominal tenderness. There is no guarding or rebound.  Musculoskeletal:        General: No tenderness. Normal range of motion.  Skin:    General: Skin is warm and dry.     Findings: No rash.  Neurological:     Mental Status: She is alert and oriented to person, place, and time.     Coordination: Coordination normal.  Psychiatric:        Behavior: Behavior normal.       Assessment & Plan:    Problem List Items Addressed This Visit      Cardiovascular and Mediastinum   Hypertension - Primary   Relevant Orders   CBC with Differential/Platelet   CMP14+EGFR     Respiratory   Allergic rhinitis   Relevant Medications   montelukast (SINGULAIR) 10 MG tablet     Other   Hyperlipemia   Relevant Orders   Lipid panel   Mild cognitive impairment   Anxiety      Patient is also been having some left-sided rib pain, and point tenderness, she cannot recall any specific incident, its been going on for couple weeks, recommended topical anti-inflammatories and stretching, possibly bruised or cracked rib.  Continue current medication  otherwise. Follow up plan: Return in about 6 months (around 09/29/2021), or if symptoms worsen or fail to improve, for Hypertension and hyperlipidemia and anxiety.  Counseling provided for all of the vaccine components No orders of the defined types were placed in this encounter.   Caryl Pina, MD [facility] 03/30/2021, 10:40 AM

## 2021-03-31 LAB — CMP14+EGFR
ALT: 18 IU/L (ref 0–32)
AST: 19 IU/L (ref 0–40)
Albumin/Globulin Ratio: 2 (ref 1.2–2.2)
Albumin: 4.3 g/dL (ref 3.8–4.8)
Alkaline Phosphatase: 71 IU/L (ref 44–121)
BUN/Creatinine Ratio: 21 (ref 12–28)
BUN: 18 mg/dL (ref 8–27)
Bilirubin Total: 0.4 mg/dL (ref 0.0–1.2)
CO2: 22 mmol/L (ref 20–29)
Calcium: 9.7 mg/dL (ref 8.7–10.3)
Chloride: 103 mmol/L (ref 96–106)
Creatinine, Ser: 0.86 mg/dL (ref 0.57–1.00)
Globulin, Total: 2.2 g/dL (ref 1.5–4.5)
Glucose: 88 mg/dL (ref 65–99)
Potassium: 4.3 mmol/L (ref 3.5–5.2)
Sodium: 142 mmol/L (ref 134–144)
Total Protein: 6.5 g/dL (ref 6.0–8.5)
eGFR: 73 mL/min/{1.73_m2} (ref >59)

## 2021-03-31 LAB — CBC WITH DIFFERENTIAL/PLATELET
Basophils Absolute: 0.1 10*3/uL (ref 0.0–0.2)
Basos: 1 %
EOS (ABSOLUTE): 0.1 10*3/uL (ref 0.0–0.4)
Eos: 1 %
Hematocrit: 40.8 % (ref 34.0–46.6)
Hemoglobin: 13.4 g/dL (ref 11.1–15.9)
Immature Grans (Abs): 0 10*3/uL (ref 0.0–0.1)
Immature Granulocytes: 0 %
Lymphocytes Absolute: 1.8 10*3/uL (ref 0.7–3.1)
Lymphs: 21 %
MCH: 30.5 pg (ref 26.6–33.0)
MCHC: 32.8 g/dL (ref 31.5–35.7)
MCV: 93 fL (ref 79–97)
Monocytes Absolute: 0.6 10*3/uL (ref 0.1–0.9)
Monocytes: 7 %
Neutrophils Absolute: 5.8 10*3/uL (ref 1.4–7.0)
Neutrophils: 70 %
Platelets: 236 10*3/uL (ref 150–450)
RBC: 4.39 x10E6/uL (ref 3.77–5.28)
RDW: 13.4 % (ref 11.7–15.4)
WBC: 8.2 10*3/uL (ref 3.4–10.8)

## 2021-03-31 LAB — LIPID PANEL
Chol/HDL Ratio: 2.6 ratio (ref 0.0–4.4)
Cholesterol, Total: 138 mg/dL (ref 100–199)
HDL: 54 mg/dL (ref >39)
LDL Chol Calc (NIH): 71 mg/dL (ref 0–99)
Triglycerides: 63 mg/dL (ref 0–149)
VLDL Cholesterol Cal: 13 mg/dL (ref 5–40)

## 2021-04-03 DIAGNOSIS — H524 Presbyopia: Secondary | ICD-10-CM | POA: Diagnosis not present

## 2021-05-31 ENCOUNTER — Other Ambulatory Visit: Payer: Self-pay | Admitting: Physician Assistant

## 2021-05-31 MED ORDER — NIRMATRELVIR/RITONAVIR (PAXLOVID)TABLET: 3.0000 | ORAL_TABLET | Freq: Two times a day (BID) | ORAL | 0 refills | Status: DC

## 2021-05-31 NOTE — Progress Notes (Signed)
Outpatient Oral COVID Treatment Note  I connected with Shelly Bennett on 05/31/2021/8:23 AM by telephone and verified that I am speaking with the correct person using two identifiers.  I discussed the limitations, risks, security, and privacy concerns of performing an evaluation and management service by telephone and the availability of in person appointments. I also discussed with the patient that there may be a patient responsible charge related to this service. The patient expressed understanding and agreed to proceed.  Patient location: home Provider location: office  Diagnosis: COVID-19 infection  Purpose of visit: Discussion of potential use of Molnupiravir or Paxlovid, a new treatment for mild to moderate COVID-19 viral infection in non-hospitalized patients.   Subjective: Patient is a 70 y.o. female who has been diagnosed with COVID 19 viral infection.  Their symptoms began on 6/13 with cough and fevers.    Past Medical History:  Diagnosis Date   Adenomatous colon polyp    Allergic drug reaction 06/25/2015   Allergy    Anxiety    Arthritis    At risk for falls    fallen 4 times recently    CAD (coronary artery disease)    Dr. Burt Knack follows    Carpal tunnel syndrome    Chronic headaches    Coccydynia 02/01/2012   Cystocele    Diverticulosis    External hemorrhoids    Focal nodular hyperplasia of liver    Gait abnormality    GERD (gastroesophageal reflux disease)    some   HCAP (healthcare-associated pneumonia) 06/25/2015   Heart murmur    HLD (hyperlipidemia)    HTN (hypertension)    IBS (irritable bowel syndrome)    Memory loss    Neuromuscular disorder ([place])    some neuropathy issues in the past- legs fibromyalgia in past-- past hx of steroid injections   Obesity    Osteoporosis    Otitis of left ear 02/01/2017   Pneumonia    Skin cancer    squamous cell - face    Allergies  Allergen Reactions   Codeine     ? Reaction ER visit.    Mold Extract  [Trichophyton]     Migraine   Penicillins Other (See Comments)    "drew my legs up as child" Pt has tolerated cephalexin in the past.   Ace Inhibitors Cough   Angiotensin Receptor Blockers Cough   Levaquin [Levofloxacin] Rash    Per notes from outpatient provider   Vytorin [Ezetimibe-Simvastatin] Other (See Comments)    cramping   Zetia [Ezetimibe] Other (See Comments)    cramping     Current Outpatient Medications:    amLODipine (NORVASC) 10 MG tablet, TAKE (1/2) TABLET DAILY., Disp: 45 tablet, Rfl: 3   aspirin 81 MG tablet, Take 81 mg by mouth daily. , Disp: , Rfl:    buPROPion (WELLBUTRIN XL) 150 MG 24 hr tablet, Take 1 tablet (150 mg total) by mouth daily., Disp: 90 tablet, Rfl: 3   Calcium Carbonate (CALCARB 600 PO), Take by mouth., Disp: , Rfl:    Cholecalciferol (VITAMIN D-3) 5000 UNITS TABS, Take 1 capsule by mouth as directed. Take one capsule by mouth daily Mon thru Friday, Disp: , Rfl:    donepezil (ARICEPT) 10 MG tablet, Take 10 mg by mouth at bedtime. Per patient taking 1/2 tablet, Disp: , Rfl:    fexofenadine (ALLEGRA) 180 MG tablet, Take 180 mg by mouth daily., Disp: , Rfl:    fluticasone (FLONASE) 50 MCG/ACT nasal spray, Place 2 sprays into  both nostrils daily., Disp: 16 g, Rfl: 3   hydrochlorothiazide (HYDRODIURIL) 25 MG tablet, TAKE (1/2) TABLET DAILY., Disp: 45 tablet, Rfl: 3   HYDROcodone-acetaminophen (NORCO/VICODIN) 5-325 MG tablet, Take 1 tablet by mouth every 6 (six) hours as needed for moderate pain., Disp: 30 tablet, Rfl: 0   memantine (NAMENDA) 10 MG tablet, Take 1 tablet (10 mg total) by mouth 2 (two) times daily., Disp: 60 tablet, Rfl: 11   montelukast (SINGULAIR) 10 MG tablet, Take 1 tablet (10 mg total) by mouth at bedtime., Disp: 90 tablet, Rfl: 3   omega-3 acid ethyl esters (LOVAZA) 1 g capsule, Take 2 capsules (2 g total) by mouth 2 (two) times daily., Disp: 360 capsule, Rfl: 3   rosuvastatin (CRESTOR) 20 MG tablet, Take 1 tablet (20 mg total) by mouth  daily., Disp: 90 tablet, Rfl: 3  Objective: Patientsounds stable.  They are in no apparent distress.  Breathing is non labored.  Mood and behavior are normal.  Laboratory Data:  Recent Results (from the past 2160 hour(s))  CBC with Differential/Platelet     Status: None   Collection Time: 03/30/21 11:17 AM  Result Value Ref Range   WBC 8.2 3.4 - 10.8 x10E3/uL   RBC 4.39 3.77 - 5.28 x10E6/uL   Hemoglobin 13.4 11.1 - 15.9 g/dL   Hematocrit 40.8 34.0 - 46.6 %   MCV 93 79 - 97 fL   MCH 30.5 26.6 - 33.0 pg   MCHC 32.8 31.5 - 35.7 g/dL   RDW 13.4 11.7 - 15.4 %   Platelets 236 150 - 450 x10E3/uL   Neutrophils 70 Not Estab. %   Lymphs 21 Not Estab. %   Monocytes 7 Not Estab. %   Eos 1 Not Estab. %   Basos 1 Not Estab. %   Neutrophils Absolute 5.8 1.4 - 7.0 x10E3/uL   Lymphocytes Absolute 1.8 0.7 - 3.1 x10E3/uL   Monocytes Absolute 0.6 0.1 - 0.9 x10E3/uL   EOS (ABSOLUTE) 0.1 0.0 - 0.4 x10E3/uL   Basophils Absolute 0.1 0.0 - 0.2 x10E3/uL   Immature Granulocytes 0 Not Estab. %   Immature Grans (Abs) 0.0 0.0 - 0.1 x10E3/uL  CMP14+EGFR     Status: None   Collection Time: 03/30/21 11:17 AM  Result Value Ref Range   Glucose 88 65 - 99 mg/dL   BUN 18 8 - 27 mg/dL   Creatinine, Ser 0.86 0.57 - 1.00 mg/dL   eGFR 73 >59 mL/min/1.73   BUN/Creatinine Ratio 21 12 - 28   Sodium 142 134 - 144 mmol/L   Potassium 4.3 3.5 - 5.2 mmol/L   Chloride 103 96 - 106 mmol/L   CO2 22 20 - 29 mmol/L   Calcium 9.7 8.7 - 10.3 mg/dL   Total Protein 6.5 6.0 - 8.5 g/dL   Albumin 4.3 3.8 - 4.8 g/dL   Globulin, Total 2.2 1.5 - 4.5 g/dL   Albumin/Globulin Ratio 2.0 1.2 - 2.2   Bilirubin Total 0.4 0.0 - 1.2 mg/dL   Alkaline Phosphatase 71 44 - 121 IU/L   AST 19 0 - 40 IU/L   ALT 18 0 - 32 IU/L  Lipid panel     Status: None   Collection Time: 03/30/21 11:17 AM  Result Value Ref Range   Cholesterol, Total 138 100 - 199 mg/dL   Triglycerides 63 0 - 149 mg/dL   HDL 54 >39 mg/dL   VLDL Cholesterol Cal 13 5 - 40  mg/dL   LDL Chol Calc (NIH) 71 0 -  99 mg/dL   Chol/HDL Ratio 2.6 0.0 - 4.4 ratio    Comment:                                   T. Chol/HDL Ratio                                             Men  Women                               1/2 Avg.Risk  3.4    3.3                                   Avg.Risk  5.0    4.4                                2X Avg.Risk  9.6    7.1                                3X Avg.Risk 23.4   11.0      Assessment: 70 y.o. female with mild/moderate COVID 19 viral infection diagnosed on 6/15 at high risk for progression to severe COVID 19.  Plan:  This patient is a 70 y.o. female that meets the following criteria for Emergency Use Authorization of: Paxlovid 1. Age >12 yr AND > 40 kg 2. SARS-COV-2 positive test 3. Symptom onset < 5 days 4. Mild-to-moderate COVID disease with high risk for severe progression to hospitalization or death  I have spoken and communicated the following to the patient or parent/caregiver regarding: Paxlovid is an unapproved drug that is authorized for use under an Emergency Use Authorization.  There are no adequate, approved, available products for the treatment of COVID-19 in adults who have mild-to-moderate COVID-19 and are at high risk for progressing to severe COVID-19, including hospitalization or death. Other therapeutics are currently authorized. For additional information on all products authorized for treatment or prevention of COVID-19, please see [employer].  There are benefits and risks of taking this treatment as outlined in the "Fact Sheet for Patients and Caregivers."  "Fact Sheet for Patients and Caregivers" was reviewed with patient. A hard copy will be provided to patient from pharmacy prior to the patient receiving treatment. Patients should continue to self-isolate and use infection control measures (e.g., wear mask,  isolate, social distance, avoid sharing personal items, clean and disinfect "high touch" surfaces, and frequent handwashing) according to CDC guidelines.  The patient or parent/caregiver has the option to accept or refuse treatment. Patient medication history was reviewed for potential drug interactions:Interaction with home meds: Wellbutrin Patient's GFR was calculated to be >60, and they were therefore prescribed Normal dose (GFR>60) - nirmatrelvir 15m tab (2 tablet) by mouth twice daily AND ritonavir 1083mtab (1 tablet) by mouth twice daily  After reviewing above information with the patient, the patient agrees to receive Paxlovid.  Follow up instructions:    Take prescription BID x 5 days as directed Reach out to pharmacist for counseling on medication if desired For concerns  regarding further COVID symptoms please follow up with your PCP or urgent care For urgent or life-threatening issues, seek care at your local emergency department  The patient was provided an opportunity to ask questions, and all were answered. The patient agreed with the plan and demonstrated an understanding of the instructions.   Script sent to [facility]  and opted to pick up RX.  The patient was advised to call their PCP or seek an in-person evaluation if the symptoms worsen or if the condition fails to improve as anticipated.   I provided 10 minutes of non face-to-face telephone visit time during this encounter, and > 50% was spent counseling as documented under my assessment & plan.  Angelena Form, PA-C 05/31/2021 /8:23 AM

## 2021-06-20 ENCOUNTER — Ambulatory Visit (INDEPENDENT_AMBULATORY_CARE_PROVIDER_SITE_OTHER): Payer: Medicare PPO

## 2021-06-20 VITALS — Ht 68.0 in | Wt 169.0 lb

## 2021-06-20 DIAGNOSIS — Z0000 Encounter for general adult medical examination without abnormal findings: Secondary | ICD-10-CM

## 2021-06-20 NOTE — Patient Instructions (Signed)
[name] , Thank you for taking time to come for your Medicare Wellness Visit. I appreciate your ongoing commitment to your health goals. Please review the following plan we discussed and let me know if I can assist you in the future.   Screening recommendations/referrals: Colonoscopy: Done 08/05/2019 - Repeat every 3 years Mammogram: Done 01/20/2021 - Repeat annually Bone Density: Done 01/14/2018 - Repeat every 2 years Recommended yearly ophthalmology/optometry visit for glaucoma screening and checkup Recommended yearly dental visit for hygiene and checkup  Vaccinations: Influenza vaccine: Done 09/29/2020 - Repeat annually Pneumococcal vaccine: Done 11/01/2015 & 08/25/2017 Tdap vaccine: Done 05/01/2017 - Repeat in 10 years Shingles vaccine: Due. Shingrix discussed. Please contact your pharmacy for coverage information.    Covid-19:One dose done 05/02/2020 - allergic reaction - advised not to get boosters  Advanced directives: Please bring a copy of your health care power of attorney and living will to the office to be added to your chart at your convenience.  Conditions/risks identified: Aim for 30 minutes of exercise or brisk walking each day, drink 6-8 glasses of water and eat lots of fruits and vegetables.  Next appointment: Follow up in one year for your annual wellness visit    Preventive Care 65 Years and Older, Female Preventive care refers to lifestyle choices and visits with your health care provider that can promote health and wellness. What does preventive care include? A yearly physical exam. This is also called an annual well check. Dental exams once or twice a year. Routine eye exams. Ask your health care provider how often you should have your eyes checked. Personal lifestyle choices, including: Daily care of your teeth and gums. Regular physical activity. Eating a healthy diet. Avoiding tobacco and drug use. Limiting alcohol use. Practicing safe sex. Taking low-dose  aspirin every day. Taking vitamin and mineral supplements as recommended by your health care provider. What happens during an annual well check? The services and screenings done by your health care provider during your annual well check will depend on your age, overall health, lifestyle risk factors, and family history of disease. Counseling  Your health care provider may ask you questions about your: Alcohol use. Tobacco use. Drug use. Emotional well-being. Home and relationship well-being. Sexual activity. Eating habits. History of falls. Memory and ability to understand (cognition). Work and work Statistician. Reproductive health. Screening  You may have the following tests or measurements: Height, weight, and BMI. Blood pressure. Lipid and cholesterol levels. These may be checked every 5 years, or more frequently if you are over 56 years old. Skin check. Lung cancer screening. You may have this screening every year starting at age 6 if you have a 30-pack-year history of smoking and currently smoke or have quit within the past 15 years. Fecal occult blood test (FOBT) of the stool. You may have this test every year starting at age 29. Flexible sigmoidoscopy or colonoscopy. You may have a sigmoidoscopy every 5 years or a colonoscopy every 10 years starting at age 77. Hepatitis C blood test. Hepatitis B blood test. Sexually transmitted disease (STD) testing. Diabetes screening. This is done by checking your blood sugar (glucose) after you have not eaten for a while (fasting). You may have this done every 1-3 years. Bone density scan. This is done to screen for osteoporosis. You may have this done starting at age 37. Mammogram. This may be done every 1-2 years. Talk to your health care provider about how often you should have regular mammograms. Talk with  your health care provider about your test results, treatment options, and if necessary, the need for more tests. Vaccines  Your  health care provider may recommend certain vaccines, such as: Influenza vaccine. This is recommended every year. Tetanus, diphtheria, and acellular pertussis (Tdap, Td) vaccine. You may need a Td booster every 10 years. Zoster vaccine. You may need this after age 25. Pneumococcal 13-valent conjugate (PCV13) vaccine. One dose is recommended after age 43. Pneumococcal polysaccharide (PPSV23) vaccine. One dose is recommended after age 26. Talk to your health care provider about which screenings and vaccines you need and how often you need them. This information is not intended to replace advice given to you by your health care provider. Make sure you discuss any questions you have with your health care provider. Document Released: 12/30/2015 Document Revised: 08/22/2016 Document Reviewed: 10/04/2015 Elsevier Interactive Patient Education  2017 [place] Prevention in the Home Falls can cause injuries. They can happen to people of all ages. There are many things you can do to make your home safe and to help prevent falls. What can I do on the outside of my home? Regularly fix the edges of walkways and driveways and fix any cracks. Remove anything that might make you trip as you walk through a door, such as a raised step or threshold. Trim any bushes or trees on the path to your home. Use bright outdoor lighting. Clear any walking paths of anything that might make someone trip, such as rocks or tools. Regularly check to see if handrails are loose or broken. Make sure that both sides of any steps have handrails. Any raised decks and porches should have guardrails on the edges. Have any leaves, snow, or ice cleared regularly. Use sand or salt on walking paths during winter. Clean up any spills in your garage right away. This includes oil or grease spills. What can I do in the bathroom? Use night lights. Install grab bars by the toilet and in the tub and shower. Do not use towel bars as  grab bars. Use non-skid mats or decals in the tub or shower. If you need to sit down in the shower, use a plastic, non-slip stool. Keep the floor dry. Clean up any water that spills on the floor as soon as it happens. Remove soap buildup in the tub or shower regularly. Attach bath mats securely with double-sided non-slip rug tape. Do not have throw rugs and other things on the floor that can make you trip. What can I do in the bedroom? Use night lights. Make sure that you have a light by your bed that is easy to reach. Do not use any sheets or blankets that are too big for your bed. They should not hang down onto the floor. Have a firm chair that has side arms. You can use this for support while you get dressed. Do not have throw rugs and other things on the floor that can make you trip. What can I do in the kitchen? Clean up any spills right away. Avoid walking on wet floors. Keep items that you use a lot in easy-to-reach places. If you need to reach something above you, use a strong step stool that has a grab bar. Keep electrical cords out of the way. Do not use floor polish or wax that makes floors slippery. If you must use wax, use non-skid floor wax. Do not have throw rugs and other things on the floor that can make you trip.  What can I do with my stairs? Do not leave any items on the stairs. Make sure that there are handrails on both sides of the stairs and use them. Fix handrails that are broken or loose. Make sure that handrails are as long as the stairways. Check any carpeting to make sure that it is firmly attached to the stairs. Fix any carpet that is loose or worn. Avoid having throw rugs at the top or bottom of the stairs. If you do have throw rugs, attach them to the floor with carpet tape. Make sure that you have a light switch at the top of the stairs and the bottom of the stairs. If you do not have them, ask someone to add them for you. What else can I do to help prevent  falls? Wear shoes that: Do not have high heels. Have rubber bottoms. Are comfortable and fit you well. Are closed at the toe. Do not wear sandals. If you use a stepladder: Make sure that it is fully opened. Do not climb a closed stepladder. Make sure that both sides of the stepladder are locked into place. Ask someone to hold it for you, if possible. Clearly mark and make sure that you can see: Any grab bars or handrails. First and last steps. Where the edge of each step is. Use tools that help you move around (mobility aids) if they are needed. These include: Canes. Walkers. Scooters. Crutches. Turn on the lights when you go into a dark area. Replace any light bulbs as soon as they burn out. Set up your furniture so you have a clear path. Avoid moving your furniture around. If any of your floors are uneven, fix them. If there are any pets around you, be aware of where they are. Review your medicines with your doctor. Some medicines can make you feel dizzy. This can increase your chance of falling. Ask your doctor what other things that you can do to help prevent falls. This information is not intended to replace advice given to you by your health care provider. Make sure you discuss any questions you have with your health care provider. Document Released: 09/29/2009 Document Revised: 05/10/2016 Document Reviewed: 01/07/2015 Elsevier Interactive Patient Education  2017 [employer].

## 2021-06-20 NOTE — Progress Notes (Signed)
Subjective:   Shelly Bennett is a 70 y.o. female who presents for Medicare Annual (Subsequent) preventive examination.  Virtual Visit via Telephone Note  I connected with  Shelly Bennett on 06/20/21 at 10:30 AM EDT by telephone and verified that I am speaking with the correct person using two identifiers.  Location: Patient: Home Provider: [facility] Persons participating in the virtual visit: patient/Nurse Health Advisor   I discussed the limitations, risks, security and privacy concerns of performing an evaluation and management service by telephone and the availability of in person appointments. The patient expressed understanding and agreed to proceed.  Interactive audio and video telecommunications were attempted between this nurse and patient, however failed, due to patient having technical difficulties OR patient did not have access to video capability.  We continued and completed visit with audio only.  Some vital signs may be absent or patient reported.   Brayen Bunn E Hensley Treat, LPN   Review of Systems     Cardiac Risk Factors include: advanced age (>58men, >76 women);dyslipidemia;hypertension;sedentary lifestyle     Objective:    Today's Vitals   06/20/21 1449  Weight: 169 lb (76.7 kg)  Height: 5\' 8"  (1.727 m)   Body mass index is 25.7 kg/m.  Advanced Directives 06/20/2021 10/13/2020 07/13/2020 06/16/2020 02/10/2020 11/24/2019 10/17/2019  Does Patient Have a Medical Advance Directive? Yes Yes No No No Yes No  Type of Paramedic of [place];Living will - - - - Living will -  Copy of [place] in Chart? No - copy requested - - - - - -  Would patient like information on creating a medical advance directive? - - - No - Patient declined No - Patient declined - No - Patient declined    Current Medications (verified) Outpatient Encounter Medications as of 06/20/2021  Medication Sig   amLODipine (NORVASC) 10 MG tablet TAKE (1/2)  TABLET DAILY.   aspirin 81 MG tablet Take 81 mg by mouth daily.    buPROPion (WELLBUTRIN XL) 150 MG 24 hr tablet Take 1 tablet (150 mg total) by mouth daily.   Calcium Carbonate (CALCARB 600 PO) Take by mouth.   Cholecalciferol (VITAMIN D-3) 5000 UNITS TABS Take 1 capsule by mouth as directed. Take one capsule by mouth daily Mon thru Friday   donepezil (ARICEPT) 10 MG tablet Take 10 mg by mouth at bedtime. Per patient taking 1/2 tablet   fexofenadine (ALLEGRA) 180 MG tablet Take 180 mg by mouth daily.   fluticasone (FLONASE) 50 MCG/ACT nasal spray Place 2 sprays into both nostrils daily.   hydrochlorothiazide (HYDRODIURIL) 25 MG tablet TAKE (1/2) TABLET DAILY.   HYDROcodone-acetaminophen (NORCO/VICODIN) 5-325 MG tablet Take 1 tablet by mouth every 6 (six) hours as needed for moderate pain.   memantine (NAMENDA) 10 MG tablet Take 1 tablet (10 mg total) by mouth 2 (two) times daily.   montelukast (SINGULAIR) 10 MG tablet Take 1 tablet (10 mg total) by mouth at bedtime.   nirmatrelvir/ritonavir EUA (PAXLOVID) TABS Take 3 tablets by mouth 2 (two) times daily.   omega-3 acid ethyl esters (LOVAZA) 1 g capsule Take 2 capsules (2 g total) by mouth 2 (two) times daily.   rosuvastatin (CRESTOR) 20 MG tablet Take 1 tablet (20 mg total) by mouth daily.   No facility-administered encounter medications on file as of 06/20/2021.    Allergies (verified) Codeine, Mold extract [trichophyton], Penicillins, Ace inhibitors, Angiotensin receptor blockers, Levaquin [levofloxacin], Vytorin [ezetimibe-simvastatin], and Zetia [ezetimibe]   History: Past Medical  History:  Diagnosis Date   Adenomatous colon polyp    Allergic drug reaction 06/25/2015   Allergy    Anxiety    Arthritis    At risk for falls    fallen 4 times recently    CAD (coronary artery disease)    Dr. Burt Knack follows    Carpal tunnel syndrome    Chronic headaches    Coccydynia 02/01/2012   Cystocele    Diverticulosis    External hemorrhoids     Focal nodular hyperplasia of liver    Gait abnormality    GERD (gastroesophageal reflux disease)    some   HCAP (healthcare-associated pneumonia) 06/25/2015   Heart murmur    HLD (hyperlipidemia)    HTN (hypertension)    IBS (irritable bowel syndrome)    Memory loss    Neuromuscular disorder (HCC)    some neuropathy issues in the past- legs fibromyalgia in past-- past hx of steroid injections   Obesity    Osteoporosis    Otitis of left ear 02/01/2017   Pneumonia    Skin cancer    squamous cell - face   Past Surgical History:  Procedure Laterality Date   CARPAL TUNNEL RELEASE     COLONOSCOPY  12/15/2008   diverticulosis, external hemorrhoids   KNEE SURGERY  05/22/2015   right   LAPAROSCOPY     SKIN SURGERY     squamous cell - right face    Family History  Problem Relation Age of Onset   Prostate cancer Father    Colon cancer Father 72   Kidney disease Father    Heart disease Father    Alzheimer's disease Father    Hyperlipidemia Mother    Hypertension Mother    Kidney disease Mother    Osteoporosis Mother    Heart murmur Mother    Dementia Mother    Hyperlipidemia Sister    Hypertension Sister    Heart disease Brother 39       not dx questionable    Heart disease Maternal Grandfather    Rectal cancer Maternal Grandmother 45   Colon polyps Neg Hx    Esophageal cancer Neg Hx    Stomach cancer Neg Hx    Social History   Socioeconomic History   Marital status: Married    Spouse name: [name]   Number of children: 4   Years of education: 16   Highest education level: Bachelor's degree (e.g., BA, AB, BS)  Occupational History   Occupation: Pharmacist, hospital retired  Tobacco Use   Smoking status: Never   Smokeless tobacco: Never  Vaping Use   Vaping Use: Never used  Substance and Sexual Activity   Alcohol use: No   Drug use: No   Sexual activity: Not Currently    Birth control/protection: None  Other Topics Concern   Not on file  Social History Narrative    Lives at home with her husband.   Right-handed.   1 cup coffee each morning. 2-3 small soda cans each day.   Social Determinants of Health   Financial Resource Strain: Low Risk    Difficulty of Paying Living Expenses: Not hard at all  Food Insecurity: No Food Insecurity   Worried About Charity fundraiser in the Last Year: Never true   [place] in the Last Year: Never true  Transportation Needs: No Transportation Needs   Lack of Transportation (Medical): No   Lack of Transportation (Non-Medical): No  Physical Activity: Sufficiently Active  Days of Exercise per Week: 5 days   Minutes of Exercise per Session: 30 min  Stress: No Stress Concern Present   Feeling of Stress : Only a little  Social Connections: Engineer, building services of Communication with Friends and Family: More than three times a week   Frequency of Social Gatherings with Friends and Family: More than three times a week   Attends Religious Services: More than 4 times per year   Active Member of [employer] or Organizations: Yes   Attends Music therapist: More than 4 times per year   Marital Status: Married    Tobacco Counseling Counseling given: Not Answered   Clinical Intake:  Pre-visit preparation completed: Yes  Pain : No/denies pain     BMI - recorded: 25.7 Nutritional Status: BMI 25 -29 Overweight Nutritional Risks: None Diabetes: No  How often do you need to have someone help you when you read instructions, pamphlets, or other written materials from your doctor or pharmacy?: 1 - Never  Diabetic? No  Interpreter Needed?: No  Information entered by :: Shakena Callari, LPN   Activities of Daily Living In your present state of health, do you have any difficulty performing the following activities: 06/20/2021  Hearing? N  Vision? N  Difficulty concentrating or making decisions? Y  Walking or climbing stairs? Y  Dressing or bathing? N  Doing errands, shopping? N  Preparing  Food and eating ? N  Using the Toilet? N  In the past six months, have you accidently leaked urine? Y  Do you have problems with loss of bowel control? N  Managing your Medications? N  Managing your Finances? N  Housekeeping or managing your Housekeeping? N  Some recent data might be hidden    Patient Care Team: Dettinger, Fransisca Kaufmann, MD as PCP - General (Family Medicine) Sherren Mocha, MD as PCP - Cardiology (Cardiology) Hillary Bow, MD (Cardiology) Sherren Mocha, MD (Cardiology) Gatha Mayer, MD (Gastroenterology) Newton Pigg, MD (Obstetrics and Gynecology) Garald Balding, MD (Orthopedic Surgery) Marcial Pacas, MD as Consulting Physician (Neurology)  Indicate any recent Medical Services you may have received from other than [facility] providers in the past year (date may be approximate).     Assessment:   This is a routine wellness examination for [facility].  Hearing/Vision screen Hearing Screening - Comments:: Denies hearing difficulties  Vision Screening - Comments:: Wears eyeglasses - up to date with annual eye exams with Dr Susa Simmonds  Dietary issues and exercise activities discussed: Current Exercise Habits: Home exercise routine, Type of exercise: walking, Time (Minutes): 30, Frequency (Times/Week): 5, Weekly Exercise (Minutes/Week): 150, Intensity: Mild, Exercise limited by: orthopedic condition(s)   Goals Addressed             This Visit's Progress    Exercise 3x per week (30 min per time)   On track      Depression Screen PHQ 2/9 Scores 06/20/2021 03/30/2021 09/29/2020 06/16/2020 01/18/2020 11/23/2019 06/16/2019  PHQ - 2 Score 0 0 0 0 0 3 0  PHQ- 9 Score - - - - - 11 -    Fall Risk Fall Risk  06/20/2021 03/30/2021 09/29/2020 06/16/2020 01/18/2020  Falls in the past year? 0 0 1 1 0  Number falls in past yr: 0 - 0 0 -  Injury with Fall? 0 - 1 1 -  Comment - - - - -  Risk for fall due to : Impaired balance/gait;Orthopedic patient;Impaired vision - Impaired  balance/gait History of  fall(s) -  Follow up Falls prevention discussed;Education provided - Falls evaluation completed Falls evaluation completed -    FALL RISK PREVENTION PERTAINING TO THE HOME:  Any stairs in or around the home? Yes  If so, are there any without handrails? Yes  Husband plans to install them soon Home free of loose throw rugs in walkways, pet beds, electrical cords, etc? Yes  Adequate lighting in your home to reduce risk of falls? Yes   ASSISTIVE DEVICES UTILIZED TO PREVENT FALLS:  Life alert? No  Use of a cane, walker or w/c? Yes  Grab bars in the bathroom? Yes  Shower chair or bench in shower? Yes  Elevated toilet seat or a handicapped toilet? Yes   TIMED UP AND GO:  Was the test performed? No . Telephonic visit   Cognitive Function: MMSE - Mini Mental State Exam 03/10/2020 02/05/2019 01/30/2018 01/22/2018 10/22/2017  Orientation to time 5 4 5 5 5   Orientation to Place 5 5 5 5 5   Registration 3 3 3 3 3   Attention/ Calculation 5 5 5 3 5   Recall 1 3 3 3 2   Language- name 2 objects 2 2 2 2 2   Language- repeat 1 1 1 1 1   Language- follow 3 step command 3 3 3 3 3   Language- read & follow direction 1 1 1 1 1   Write a sentence 1 1 1 1 1   Copy design 1 1 1 1 1   Total score 28 29 30 28 29      6CIT Screen 06/20/2021 06/16/2020 06/16/2019  What Year? 0 points 0 points 0 points  What month? 0 points 3 points 0 points  What time? 0 points 0 points 0 points  Count back from 20 0 points 0 points 0 points  Months in reverse 0 points 0 points 0 points  Repeat phrase 0 points 2 points 0 points  Total Score 0 5 0    Immunizations Immunization History  Administered Date(s) Administered   Fluad Quad(high Dose 65+) 10/19/2019, 09/29/2020   Influenza Whole 11/16/2012   Influenza, High Dose Seasonal PF 09/15/2016, 09/04/2017, 09/24/2018   Influenza,inj,Quad PF,6+ Mos 09/15/2013, 10/06/2014, 11/01/2015   Moderna Sars-Covid-2 Vaccination 05/02/2020   Pneumococcal Conjugate-13  11/01/2015   Pneumococcal Polysaccharide-23 09/04/2017   Tdap 05/01/2017    TDAP status: Up to date  Flu Vaccine status: Up to date  Pneumococcal vaccine status: Up to date  Covid-19 vaccine status: Declined, Education has been provided regarding the importance of this vaccine but patient still declined. Advised may receive this vaccine at local pharmacy or Health Dept.or vaccine clinic. Aware to provide a copy of the vaccination record if obtained from local pharmacy or Health Dept. Verbalized acceptance and understanding.  Qualifies for Shingles Vaccine? Yes   Zostavax completed Yes   Shingrix Completed?: No.    Education has been provided regarding the importance of this vaccine. Patient has been advised to call insurance company to determine out of pocket expense if they have not yet received this vaccine. Advised may also receive vaccine at local pharmacy or Health Dept. Verbalized acceptance and understanding.  Screening Tests Health Maintenance  Topic Date Due   Zoster Vaccines- Shingrix (1 of 2) Never done   COLON CANCER SCREENING ANNUAL FOBT  08/04/2020   Hepatitis C Screening  05/02/2023 (Originally 08/07/1969)   INFLUENZA VACCINE  07/17/2021   COLONOSCOPY (Pts 45-97yrs Insurance coverage will need to be confirmed)  08/04/2022   MAMMOGRAM  01/20/2023   TETANUS/TDAP  05/02/2027  DEXA SCAN  Completed   PNA vac Low Risk Adult  Completed   HPV VACCINES  Aged Out   COVID-19 Vaccine  Discontinued    Health Maintenance  Health Maintenance Due  Topic Date Due   Zoster Vaccines- Shingrix (1 of 2) Never done   COLON CANCER SCREENING ANNUAL FOBT  08/04/2020    Colorectal cancer screening: Type of screening: Colonoscopy. Completed 08/05/2019. Repeat every 3 years  Mammogram status: Completed 01/20/2021. Repeat every year  Bone Density status: Completed 01/14/2018. Results reflect: Bone density results: OSTEOPENIA. Repeat every 2 years.  Lung Cancer Screening: (Low Dose CT  Chest recommended if Age 29-80 years, 30 pack-year currently smoking OR have quit w/in 15years.) does not qualify.   Additional Screening:  Hepatitis C Screening: does qualify; needs this drawn  Vision Screening: Recommended annual ophthalmology exams for early detection of glaucoma and other disorders of the eye. Is the patient up to date with their annual eye exam?  Yes  Who is the provider or what is the name of the office in which the patient attends annual eye exams?  [facility] If pt is not established with a provider, would they like to be referred to a provider to establish care? No .   Dental Screening: Recommended annual dental exams for proper oral hygiene  Community Resource Referral / Chronic Care Management: CRR required this visit?  No   CCM required this visit?  No      Plan:     I have personally reviewed and noted the following in the patient's chart:   Medical and social history Use of alcohol, tobacco or illicit drugs  Current medications and supplements including opioid prescriptions.  Functional ability and status Nutritional status Physical activity Advanced directives List of other physicians Hospitalizations, surgeries, and ER visits in previous 12 months Vitals Screenings to include cognitive, depression, and falls Referrals and appointments  In addition, I have reviewed and discussed with patient certain preventive protocols, quality metrics, and best practice recommendations. A written personalized care plan for preventive services as well as general preventive health recommendations were provided to patient.     Sandrea Hammond, LPN   07/17/4480   Nurse Notes: None

## 2021-06-28 DIAGNOSIS — B0050 Herpesviral ocular disease, unspecified: Secondary | ICD-10-CM | POA: Diagnosis not present

## 2021-06-28 DIAGNOSIS — Z8669 Personal history of other diseases of the nervous system and sense organs: Secondary | ICD-10-CM | POA: Diagnosis not present

## 2021-06-28 DIAGNOSIS — H2513 Age-related nuclear cataract, bilateral: Secondary | ICD-10-CM | POA: Diagnosis not present

## 2021-06-28 DIAGNOSIS — H02403 Unspecified ptosis of bilateral eyelids: Secondary | ICD-10-CM | POA: Diagnosis not present

## 2021-07-05 DIAGNOSIS — Z01419 Encounter for gynecological examination (general) (routine) without abnormal findings: Secondary | ICD-10-CM | POA: Diagnosis not present

## 2021-07-05 DIAGNOSIS — Z1231 Encounter for screening mammogram for malignant neoplasm of breast: Secondary | ICD-10-CM | POA: Diagnosis not present

## 2021-07-05 DIAGNOSIS — N8111 Cystocele, midline: Secondary | ICD-10-CM | POA: Diagnosis not present

## 2021-07-05 DIAGNOSIS — Z124 Encounter for screening for malignant neoplasm of cervix: Secondary | ICD-10-CM | POA: Diagnosis not present

## 2021-07-19 ENCOUNTER — Other Ambulatory Visit: Payer: Self-pay | Admitting: Family Medicine

## 2021-07-19 ENCOUNTER — Other Ambulatory Visit: Payer: Self-pay | Admitting: Neurology

## 2021-08-02 ENCOUNTER — Encounter: Payer: Self-pay | Admitting: Internal Medicine

## 2021-08-02 ENCOUNTER — Other Ambulatory Visit: Payer: Self-pay

## 2021-08-02 ENCOUNTER — Ambulatory Visit (INDEPENDENT_AMBULATORY_CARE_PROVIDER_SITE_OTHER): Payer: Medicare PPO | Admitting: Internal Medicine

## 2021-08-02 ENCOUNTER — Ambulatory Visit (INDEPENDENT_AMBULATORY_CARE_PROVIDER_SITE_OTHER)
Admission: RE | Admit: 2021-08-02 | Discharge: 2021-08-02 | Disposition: A | Discharge status: Home / Self Care | Payer: Medicare PPO | Source: Ambulatory Visit | Attending: Internal Medicine | Admitting: Internal Medicine

## 2021-08-02 VITALS — BP 120/56 | HR 61 | Ht 68.0 in | Wt 173.5 lb

## 2021-08-02 DIAGNOSIS — R159 Full incontinence of feces: Secondary | ICD-10-CM

## 2021-08-02 DIAGNOSIS — R0781 Pleurodynia: Secondary | ICD-10-CM

## 2021-08-02 DIAGNOSIS — R32 Unspecified urinary incontinence: Secondary | ICD-10-CM | POA: Diagnosis not present

## 2021-08-02 DIAGNOSIS — R109 Unspecified abdominal pain: Secondary | ICD-10-CM | POA: Diagnosis not present

## 2021-08-02 NOTE — Progress Notes (Signed)
Shelly Bennett 70 y.o. May 03, 1951 [id number]  Assessment & Plan:   Encounter Diagnoses  Name Primary?   Rib pain on left side Yes   Abdominal wall pain    Urinary and fecal incontinence    I think her problems are musculoskeletal.  Though typically unrevealing I think it is worthwhile getting rib films.  Further plans pending that.  Question injection of steroids.  She is already using Voltaren gel everywhere.  Question physical therapy.  I think these are probably at least somewhat related to her altered gait and issues with her spine and back.  She does not recall any type of injury. Orders Placed This Encounter  Procedures   DG Ribs Bilateral   Consider return to pelvic floor physical therapy if desired.  I think this would help her urinary and rectal/fecal symptomatology to have a refresher if she will.  Rib films NL  Will refer to PT for help w/ pelvic floor and abd wall and ribs  I appreciate the opportunity to care for this patient. CC: Dettinger, Fransisca Kaufmann, MD  Subjective:   Chief Complaint: Left abdominal pain can be stabbing fairly constant, defecation with urination  HPI [name] is here for follow-up with complaints of left-sided abdominal pain that she says her primary care provider told her was a bruised or strained rib.  She has a pain worse with moving at times very tender mostly.  She is concerned about the possibility of pancreatic cancer or something like that.  She was seen last in September after having had a colonoscopy showing adenomatous polyps, because she had fecal and urinary incontinence issues.  She did go to pelvic floor PT and notes indicates she had some benefit.  I had considered magnetic resonance defecography but that did not happen perhaps due to cost.  At any rate she also had falls and hip problems and went through a lot of other physical therapy after that so its been a long trying course with a lot of back pain issues.  The pain she is  describing as sharp and mostly at tenderness but can occur with movement etc. and she is worried about it as outlined above.  She continues with frequent urination and she has defecation when she urinates.  There have been some urgency incontinence issues though not as bad as in the past.  Recent gynecologic follow-up with Dr. Ulanda Edison.  She said she made him aware of this but there was no mention of cystocele or dropped bladder etc. or anything like that.  No recommendations for treatment.     Allergies  Allergen Reactions   Codeine     ? Reaction ER visit.    Mold Extract [Trichophyton]     Migraine   Penicillins Other (See Comments)    "drew my legs up as child" Pt has tolerated cephalexin in the past.   Ace Inhibitors Cough   Angiotensin Receptor Blockers Cough   Levaquin [Levofloxacin] Rash    Per notes from outpatient provider   Vytorin [Ezetimibe-Simvastatin] Other (See Comments)    cramping   Zetia [Ezetimibe] Other (See Comments)    cramping   Current Meds  Medication Sig   amLODipine (NORVASC) 10 MG tablet TAKE (1/2) TABLET DAILY.   aspirin 81 MG tablet Take 81 mg by mouth daily.    buPROPion (WELLBUTRIN XL) 150 MG 24 hr tablet Take 1 tablet (150 mg total) by mouth daily.   Calcium Carbonate (CALCARB 600 PO) Take by mouth.  Cholecalciferol (VITAMIN D-3) 5000 UNITS TABS Take 1 capsule by mouth as directed. Take one capsule by mouth daily Mon thru Friday   donepezil (ARICEPT) 10 MG tablet Take 10 mg by mouth at bedtime. Per patient taking 1/2 tablet   fexofenadine (ALLEGRA) 180 MG tablet Take 180 mg by mouth daily.   fluticasone (FLONASE) 50 MCG/ACT nasal spray Place 2 sprays into both nostrils daily.   hydrochlorothiazide (HYDRODIURIL) 25 MG tablet TAKE (1/2) TABLET DAILY.   HYDROcodone-acetaminophen (NORCO/VICODIN) 5-325 MG tablet Take 1 tablet by mouth every 6 (six) hours as needed for moderate pain.   memantine (NAMENDA) 10 MG tablet TAKE 1 TABLET 2 TIMES A DAY    montelukast (SINGULAIR) 10 MG tablet Take 1 tablet (10 mg total) by mouth at bedtime.   omega-3 acid ethyl esters (LOVAZA) 1 g capsule Take 2 capsules (2 g total) by mouth 2 (two) times daily.   rosuvastatin (CRESTOR) 20 MG tablet Take 1 tablet (20 mg total) by mouth daily.   Past Medical History:  Diagnosis Date   Adenomatous colon polyp    Allergic drug reaction 06/25/2015   Allergy    Anxiety    Arthritis    At risk for falls    fallen 4 times recently    CAD (coronary artery disease)    Dr. Burt Knack follows    Carpal tunnel syndrome    Chronic headaches    Coccydynia 02/01/2012   Cystocele    Diverticulosis    External hemorrhoids    Focal nodular hyperplasia of liver    Gait abnormality    GERD (gastroesophageal reflux disease)    some   HCAP (healthcare-associated pneumonia) 06/25/2015   Heart murmur    HLD (hyperlipidemia)    HTN (hypertension)    IBS (irritable bowel syndrome)    Memory loss    Neuromuscular disorder (HCC)    some neuropathy issues in the past- legs fibromyalgia in past-- past hx of steroid injections   Obesity    Osteoporosis    Otitis of left ear 02/01/2017   Pneumonia    Skin cancer    squamous cell - face   Past Surgical History:  Procedure Laterality Date   CARPAL TUNNEL RELEASE     COLONOSCOPY  12/15/2008   diverticulosis, external hemorrhoids   KNEE SURGERY  05/22/2015   right   LAPAROSCOPY     SKIN SURGERY     squamous cell - right face    Social History   Social History Narrative   Lives at home with her husband.   Right-handed.   1 cup coffee each morning. 2-3 small soda cans each day.   family history includes Alzheimer's disease in her father; Colon cancer (age of onset: 81) in her father; Dementia in her mother; Heart disease in her father and maternal grandfather; Heart disease (age of onset: 90) in her brother; Heart murmur in her mother; Hyperlipidemia in her mother and sister; Hypertension in her mother and sister; Kidney  disease in her father and mother; Osteoporosis in her mother; Prostate cancer in her father; Rectal cancer (age of onset: 16) in her maternal grandmother.   Review of Systems As per HPI  Objective:   Physical Exam BP (!) 120/56   Pulse 61   Ht '5\' 8"'$  (1.727 m)   Wt 173 lb 8 oz (78.7 kg)   BMI 26.38 kg/m   Very tender left inferior ant/lat ribs, pain with truncal twist also Also very tender epigastrium - ++ carnett's  Slow gait antalgic left

## 2021-08-02 NOTE — Patient Instructions (Signed)
If you are age 71 or older, your body mass index should be between 23-30. Your Body mass index is 26.38 kg/m. If this is out of the aforementioned range listed, please consider follow up with your Primary Care Provider.  If you are age 72 or younger, your body mass index should be between 19-25. Your Body mass index is 26.38 kg/m. If this is out of the aformentioned range listed, please consider follow up with your Primary Care Provider.   __________________________________________________________  [facility] providers would like to encourage you to use [facility] to communicate with providers for non-urgent requests or questions.  Due to long hold times on the telephone, sending your provider a message by [facility] may be a faster and more efficient way to get a response.  Please allow 48 business hours for a response.  Please remember that this is for non-urgent requests.   Please go to the basement and have x-rays taken today. We will call you with results and plans.  I appreciate the opportunity to care for you. Silvano Rusk, MD, [facility]

## 2021-08-04 ENCOUNTER — Other Ambulatory Visit: Payer: Self-pay

## 2021-08-04 DIAGNOSIS — R159 Full incontinence of feces: Secondary | ICD-10-CM

## 2021-08-04 DIAGNOSIS — R0781 Pleurodynia: Secondary | ICD-10-CM

## 2021-08-04 DIAGNOSIS — R109 Unspecified abdominal pain: Secondary | ICD-10-CM

## 2021-08-11 ENCOUNTER — Other Ambulatory Visit: Payer: Self-pay | Admitting: Neurology

## 2021-08-14 ENCOUNTER — Other Ambulatory Visit: Payer: Self-pay | Admitting: Family Medicine

## 2021-08-28 ENCOUNTER — Ambulatory Visit: Payer: Medicare PPO | Admitting: Cardiovascular Disease

## 2021-08-28 ENCOUNTER — Encounter: Payer: Self-pay | Admitting: Cardiovascular Disease

## 2021-08-28 ENCOUNTER — Other Ambulatory Visit: Payer: Self-pay

## 2021-08-28 ENCOUNTER — Ambulatory Visit (HOSPITAL_COMMUNITY): Payer: Medicare PPO | Attending: Cardiology

## 2021-08-28 VITALS — BP 118/74 | HR 52 | Ht 68.0 in | Wt 175.6 lb

## 2021-08-28 DIAGNOSIS — Q231 Congenital insufficiency of aortic valve: Secondary | ICD-10-CM

## 2021-08-28 DIAGNOSIS — I10 Essential (primary) hypertension: Secondary | ICD-10-CM | POA: Diagnosis not present

## 2021-08-28 DIAGNOSIS — I2510 Atherosclerotic heart disease of native coronary artery without angina pectoris: Secondary | ICD-10-CM

## 2021-08-28 DIAGNOSIS — E782 Mixed hyperlipidemia: Secondary | ICD-10-CM

## 2021-08-28 LAB — ECHOCARDIOGRAM COMPLETE
AR max vel: 1.67 cm2
AV Area VTI: 1.54 cm2
AV Area mean vel: 1.49 cm2
AV Mean grad: 10 mmHg
AV Peak grad: 15.1 mmHg
Ao pk vel: 1.94 m/s
Area-P 1/2: 2.73 cm2
S' Lateral: 2.3 cm

## 2021-08-28 NOTE — Progress Notes (Signed)
Cardiology Office Note:    Date:  08/28/2021   ID:  Shelly Bennett, DOB 1951/10/28, MRN [id number]  PCP:  Dettinger, Fransisca Kaufmann, MD   [facility] Providers Cardiologist:  Sherren Mocha, MD     Referring MD: Dettinger, Fransisca Kaufmann, MD   Chief Complaint  Patient presents with   Aortic Stenosis     History of Present Illness:    Shelly Bennett is a 70 y.o. female with a hx of nonobstructive coronary artery disease and bicuspid aortic valve, presenting for follow-up evaluation.  The patient is here with her husband today.  She is doing quite well at present.  She denies any changes in her symptoms since last seen 1 year ago.  She has lost a lot of weight with the [place].  Her weight and September 2020 was 217 pounds and today's weight is 175 pounds.  She denies chest pain, chest pressure, or shortness of breath.  Her physical activity is limited by problems with her hip and gait instability.  She denies leg swelling, orthopnea, or PND.  No lightheadedness or syncope.    Past Medical History:  Diagnosis Date   Adenomatous colon polyp    Allergic drug reaction 06/25/2015   Allergy    Anxiety    Arthritis    At risk for falls    fallen 4 times recently    CAD (coronary artery disease)    Dr. Burt Knack follows    Carpal tunnel syndrome    Chronic headaches    Coccydynia 02/01/2012   Cystocele    Diverticulosis    External hemorrhoids    Focal nodular hyperplasia of liver    Gait abnormality    GERD (gastroesophageal reflux disease)    some   HCAP (healthcare-associated pneumonia) 06/25/2015   Heart murmur    HLD (hyperlipidemia)    HTN (hypertension)    IBS (irritable bowel syndrome)    Memory loss    Neuromuscular disorder ([place])    some neuropathy issues in the past- legs fibromyalgia in past-- past hx of steroid injections   Obesity    Osteoporosis    Otitis of left ear 02/01/2017   Pneumonia    Skin cancer    squamous cell - face    Past Surgical  History:  Procedure Laterality Date   CARPAL TUNNEL RELEASE     COLONOSCOPY  12/15/2008   diverticulosis, external hemorrhoids   KNEE SURGERY  05/22/2015   right   LAPAROSCOPY     SKIN SURGERY     squamous cell - right face     Current Medications: Current Meds  Medication Sig   amLODipine (NORVASC) 10 MG tablet TAKE (1/2) TABLET DAILY.   aspirin 81 MG tablet Take 81 mg by mouth daily.    buPROPion (WELLBUTRIN XL) 150 MG 24 hr tablet Take 1 tablet (150 mg total) by mouth daily.   Calcium Carbonate (CALCARB 600 PO) Take by mouth.   Cholecalciferol (VITAMIN D-3) 5000 UNITS TABS Take 1 capsule by mouth as directed. Take one capsule by mouth daily Mon thru Friday   donepezil (ARICEPT) 10 MG tablet Take 10 mg by mouth at bedtime. Per patient taking 1/2 tablet   fexofenadine (ALLEGRA) 180 MG tablet Take 180 mg by mouth daily.   fluticasone (FLONASE) 50 MCG/ACT nasal spray Place 2 sprays into both nostrils daily.   hydrochlorothiazide (HYDRODIURIL) 25 MG tablet TAKE (1/2) TABLET DAILY.   HYDROcodone-acetaminophen (NORCO/VICODIN) 5-325 MG tablet Take 1 tablet by mouth  every 6 (six) hours as needed for moderate pain.   memantine (NAMENDA) 10 MG tablet TAKE 1 TABLET 2 TIMES A DAY   montelukast (SINGULAIR) 10 MG tablet Take 1 tablet (10 mg total) by mouth at bedtime.   omega-3 acid ethyl esters (LOVAZA) 1 g capsule Take 2 capsules (2 g total) by mouth 2 (two) times daily.   rosuvastatin (CRESTOR) 20 MG tablet Take 1 tablet (20 mg total) by mouth daily.     Allergies:   Codeine, Mold extract [trichophyton], Penicillins, Ace inhibitors, Angiotensin receptor blockers, Levaquin [levofloxacin], Vytorin [ezetimibe-simvastatin], and Zetia [ezetimibe]   Social History   Socioeconomic History   Marital status: Married    Spouse name: [name]   Number of children: 4   Years of education: 16   Highest education level: Bachelor's degree (e.g., BA, AB, BS)  Occupational History   Occupation: Pharmacist, hospital  retired  Tobacco Use   Smoking status: Never   Smokeless tobacco: Never  Vaping Use   Vaping Use: Never used  Substance and Sexual Activity   Alcohol use: No   Drug use: No   Sexual activity: Not Currently    Birth control/protection: None  Other Topics Concern   Not on file  Social History Narrative   Lives at home with her husband.   Right-handed.   1 cup coffee each morning. 2-3 small soda cans each day.   Social Determinants of Health   Financial Resource Strain: Low Risk    Difficulty of Paying Living Expenses: Not hard at all  Food Insecurity: No Food Insecurity   Worried About Charity fundraiser in the Last Year: Never true   [place] in the Last Year: Never true  Transportation Needs: No Transportation Needs   Lack of Transportation (Medical): No   Lack of Transportation (Non-Medical): No  Physical Activity: Sufficiently Active   Days of Exercise per Week: 5 days   Minutes of Exercise per Session: 30 min  Stress: No Stress Concern Present   Feeling of Stress : Only a little  Social Connections: Engineer, building services of Communication with Friends and Family: More than three times a week   Frequency of Social Gatherings with Friends and Family: More than three times a week   Attends Religious Services: More than 4 times per year   Active Member of [employer] or Organizations: Yes   Attends Music therapist: More than 4 times per year   Marital Status: Married     Family History: The patient's family history includes Alzheimer's disease in her father; Colon cancer (age of onset: 25) in her father; Dementia in her mother; Heart disease in her father and maternal grandfather; Heart disease (age of onset: 72) in her brother; Heart murmur in her mother; Hyperlipidemia in her mother and sister; Hypertension in her mother and sister; Kidney disease in her father and mother; Osteoporosis in her mother; Prostate cancer in her father; Rectal cancer  (age of onset: 51) in her maternal grandmother. There is no history of Colon polyps, Esophageal cancer, or Stomach cancer.  ROS:   Please see the history of present illness.    Back pain, hip pain, gait instability.  All other systems reviewed and are negative.  EKGs/Labs/Other Studies Reviewed:    The following studies were reviewed today: The patient's echocardiogram from today is reviewed. IMPRESSIONS     1. Left ventricular ejection fraction, by estimation, is 60 to 65%. The  left ventricle has normal  function. The left ventricle has no regional  wall motion abnormalities. Left ventricular diastolic parameters are  consistent with Grade I diastolic  dysfunction (impaired relaxation).   2. Right ventricular systolic function is normal. The right ventricular  size is normal. There is normal pulmonary artery systolic pressure. The  estimated right ventricular systolic pressure is 123XX123 mmHg.   3. The mitral valve is normal in structure. Trivial mitral valve  regurgitation. No evidence of mitral stenosis.   4. The aortic valve is tricuspid. Aortic valve regurgitation is not  visualized. Mild aortic valve stenosis. Aortic valve area, by VTI measures  1.54 cm. Aortic valve mean gradient measures 10.0 mmHg.   5. The inferior vena cava is normal in size with greater than 50%  respiratory variability, suggesting right atrial pressure of 3 mmHg.   Gated coronary CTA 09/15/2019: IMPRESSION: 1. Coronary artery calcium score 414 Agatston units. This places the patient in the 91st percentile for age and gender, suggesting high risk for future cardiac events.   2. Extensive plaque but suspect no more than mild stenosis. Will send FFR just to check on the ostial LCx and D1.  EKG:  EKG is ordered today.  The ekg ordered today demonstrates sinus bradycardia 52 bpm, leftward axis  Recent Labs: 03/30/2021: ALT 18; BUN 18; Creatinine, Ser 0.86; Hemoglobin 13.4; Platelets 236; Potassium 4.3;  Sodium 142  Recent Lipid Panel    Component Value Date/Time   CHOL 138 03/30/2021 1117   TRIG 63 03/30/2021 1117   TRIG 116 05/01/2017 0958   HDL 54 03/30/2021 1117   HDL 64 05/01/2017 0958   CHOLHDL 2.6 03/30/2021 1117   LDLCALC 71 03/30/2021 1117   LDLCALC 74 08/19/2014 0920     Risk Assessment/Calculations:           Physical Exam:    VS:  BP 118/74   Pulse (!) 52   Ht '5\' 8"'$  (1.727 m)   Wt 175 lb 9.6 oz (79.7 kg)   SpO2 97%   BMI 26.70 kg/m     Wt Readings from Last 3 Encounters:  08/28/21 175 lb 9.6 oz (79.7 kg)  08/02/21 173 lb 8 oz (78.7 kg)  06/20/21 169 lb (76.7 kg)     GEN:  Well nourished, well developed in no acute distress HEENT: Normal NECK: No JVD; No carotid bruits LYMPHATICS: No lymphadenopathy CARDIAC: RRR, 2/6 systolic murmur at the right upper sternal border, A2 is audible. RESPIRATORY:  Clear to auscultation without rales, wheezing or rhonchi  ABDOMEN: Soft, non-tender, non-distended MUSCULOSKELETAL:  No edema; No deformity  SKIN: Warm and dry NEUROLOGIC:  Alert and oriented x 3 PSYCHIATRIC:  Normal affect   ASSESSMENT:    1. Bicuspid aortic valve   2. Essential hypertension   3. Mixed hyperlipidemia   4. Coronary artery disease involving native coronary artery of native heart without angina pectoris    PLAN:    In order of problems listed above:  The patient has mild aortic stenosis.  I personally reviewed her echo images today.  I am not sure whether she has a bicuspid or tricuspid valve and she may have partial cusp fusion.  At this point her aortic stenosis is mild and I will follow-up with her in 1 year.  I would anticipate a repeat echo in about 2 years.  She has no history of aortic dilatation.  A CAT scan in September 2020 showed normal caliber thoracic aorta. Blood pressure is well controlled on amlodipine and hydrochlorothiazide Lipids  are satisfactory with an LDL cholesterol of 71 and an HDL cholesterol of 54 on rosuvastatin  20 mg daily. No anginal symptoms.  Treated with aspirin 81 mg daily and a statin drug.  Medication Adjustments/Labs and Tests Ordered: Current medicines are reviewed at length with the patient today.  Concerns regarding medicines are outlined above.  Orders Placed This Encounter  Procedures   EKG 12-Lead   No orders of the defined types were placed in this encounter.   Patient Instructions  Medication Instructions:  Your physician recommends that you continue on your current medications as directed. Please refer to the Current Medication list given to you today.  *If you need a refill on your cardiac medications before your next appointment, please call your pharmacy*   Lab Work: none If you have labs (blood work) drawn today and your tests are completely normal, you will receive your results only by: [place] (if you have MyChart) OR A paper copy in the mail If you have any lab test that is abnormal or we need to change your treatment, we will call you to review the results.   Testing/Procedures: none   Follow-Up: At [facility], you and your health needs are our priority.  As part of our continuing mission to provide you with exceptional heart care, we have created designated Provider Care Teams.  These Care Teams include your primary Cardiologist (physician) and Advanced Practice Providers (APPs -  Physician Assistants and Nurse Practitioners) who all work together to provide you with the care you need, when you need it.  We recommend signing up for the patient portal called "MyChart".  Sign up information is provided on this After Visit Summary.  MyChart is used to connect with patients for Virtual Visits (Telemedicine).  Patients are able to view lab/test results, encounter notes, upcoming appointments, etc.  Non-urgent messages can be sent to your provider as well.   To learn more about what you can do with MyChart, go to [email or web address].    Your next  appointment:   12 month(s)  The format for your next appointment:   In Person  Provider:   You may see Sherren Mocha, MD or one of the following Advanced Practice Providers on your designated Care Team:   Richardson Dopp, PA-C Robbie Lis, [place]   Other Instructions    Signed, Sherren Mocha, MD  08/28/2021 6:10 PM    [facility]

## 2021-08-28 NOTE — Patient Instructions (Signed)
Medication Instructions:  Your physician recommends that you continue on your current medications as directed. Please refer to the Current Medication list given to you today.  *If you need a refill on your cardiac medications before your next appointment, please call your pharmacy*   Lab Work: none If you have labs (blood work) drawn today and your tests are completely normal, you will receive your results only by: [place] (if you have MyChart) OR A paper copy in the mail If you have any lab test that is abnormal or we need to change your treatment, we will call you to review the results.   Testing/Procedures: none   Follow-Up: At [facility], you and your health needs are our priority.  As part of our continuing mission to provide you with exceptional heart care, we have created designated Provider Care Teams.  These Care Teams include your primary Cardiologist (physician) and Advanced Practice Providers (APPs -  Physician Assistants and Nurse Practitioners) who all work together to provide you with the care you need, when you need it.  We recommend signing up for the patient portal called "MyChart".  Sign up information is provided on this After Visit Summary.  MyChart is used to connect with patients for Virtual Visits (Telemedicine).  Patients are able to view lab/test results, encounter notes, upcoming appointments, etc.  Non-urgent messages can be sent to your provider as well.   To learn more about what you can do with MyChart, go to [email or web address].    Your next appointment:   12 month(s)  The format for your next appointment:   In Person  Provider:   You may see Sherren Mocha, MD or one of the following Advanced Practice Providers on your designated Care Team:   Richardson Dopp, PA-C Robbie Lis, [place]   Other Instructions

## 2021-09-12 DIAGNOSIS — Z08 Encounter for follow-up examination after completed treatment for malignant neoplasm: Secondary | ICD-10-CM | POA: Diagnosis not present

## 2021-09-12 DIAGNOSIS — L814 Other melanin hyperpigmentation: Secondary | ICD-10-CM | POA: Diagnosis not present

## 2021-09-12 DIAGNOSIS — L298 Other pruritus: Secondary | ICD-10-CM | POA: Diagnosis not present

## 2021-09-12 DIAGNOSIS — R208 Other disturbances of skin sensation: Secondary | ICD-10-CM | POA: Diagnosis not present

## 2021-09-12 DIAGNOSIS — Z86007 Personal history of in-situ neoplasm of skin: Secondary | ICD-10-CM | POA: Diagnosis not present

## 2021-09-12 DIAGNOSIS — L820 Inflamed seborrheic keratosis: Secondary | ICD-10-CM | POA: Diagnosis not present

## 2021-09-12 DIAGNOSIS — D225 Melanocytic nevi of trunk: Secondary | ICD-10-CM | POA: Diagnosis not present

## 2021-09-12 DIAGNOSIS — L821 Other seborrheic keratosis: Secondary | ICD-10-CM | POA: Diagnosis not present

## 2021-09-12 DIAGNOSIS — D2262 Melanocytic nevi of left upper limb, including shoulder: Secondary | ICD-10-CM | POA: Diagnosis not present

## 2021-09-20 ENCOUNTER — Ambulatory Visit: Payer: Medicare PPO | Admitting: Family Medicine

## 2021-09-20 ENCOUNTER — Other Ambulatory Visit: Payer: Self-pay

## 2021-09-20 ENCOUNTER — Encounter: Payer: Self-pay | Admitting: Family Medicine

## 2021-09-20 VITALS — BP 138/77 | HR 57 | Ht 68.0 in | Wt 177.0 lb

## 2021-09-20 DIAGNOSIS — I2510 Atherosclerotic heart disease of native coronary artery without angina pectoris: Secondary | ICD-10-CM

## 2021-09-20 DIAGNOSIS — G3184 Mild cognitive impairment, so stated: Secondary | ICD-10-CM | POA: Diagnosis not present

## 2021-09-20 DIAGNOSIS — E782 Mixed hyperlipidemia: Secondary | ICD-10-CM | POA: Diagnosis not present

## 2021-09-20 DIAGNOSIS — I10 Essential (primary) hypertension: Secondary | ICD-10-CM | POA: Diagnosis not present

## 2021-09-20 MED ORDER — MEMANTINE HCL 10 MG PO TABS: 10.0000 mg | ORAL_TABLET | Freq: Two times a day (BID) | ORAL | 3 refills | Status: DC

## 2021-09-20 MED ORDER — HYDROCHLOROTHIAZIDE 25 MG PO TABS: ORAL_TABLET | ORAL | 3 refills | Status: DC

## 2021-09-20 MED ORDER — ROSUVASTATIN CALCIUM 20 MG PO TABS: 20.0000 mg | ORAL_TABLET | Freq: Every day | ORAL | 3 refills | Status: DC

## 2021-09-20 MED ORDER — AMLODIPINE BESYLATE 10 MG PO TABS: ORAL_TABLET | ORAL | 3 refills | Status: DC

## 2021-09-20 MED ORDER — MEMANTINE HCL 10 MG PO TABS: 10.0000 mg | ORAL_TABLET | Freq: Two times a day (BID) | ORAL | 1 refills | Status: DC

## 2021-09-20 MED ORDER — OMEGA-3-ACID ETHYL ESTERS 1 G PO CAPS: 2.0000 g | ORAL_CAPSULE | Freq: Two times a day (BID) | ORAL | 3 refills | Status: DC

## 2021-09-20 MED ORDER — BUPROPION HCL ER (XL) 150 MG PO TB24: 150.0000 mg | ORAL_TABLET | Freq: Every day | ORAL | 3 refills | Status: DC

## 2021-09-20 NOTE — Progress Notes (Signed)
BP 138/77   Pulse (!) 57   Ht _0  (1.727 m)   Wt 177 lb (80.3 kg)   SpO2 97%   BMI 26.91 kg/m    Subjective:   Patient ID: Shelly Bennett, female    DOB: 1951/11/18, 70 y.o.   MRN: [id number]  HPI: Shelly Bennett is a 70 y.o. female presenting on 09/20/2021 for Medical Management of Chronic Issues and Hypertension   HPI Hyperlipidemia Patient is coming in for recheck of his hyperlipidemia. The patient is currently taking Crestor and fish oil. They deny any issues with myalgias or history of liver damage from it. They deny any focal numbness or weakness or chest pain.   Hypertension Patient is currently on amlodipine and hydrochlorothiazide, and their blood pressure today is 138/77. Patient denies any lightheadedness or dizziness. Patient denies headaches, blurred vision, chest pains, shortness of breath, or weakness. Denies any side effects from medication and is content with current medication.   Patient has mild cognitive impairment and anxiety and is currently taking Wellbutrin and donepezil and Namenda and feels like they are doing well for her.  Relevant past medical, surgical, family and social history reviewed and updated as indicated. Interim medical history since our last visit reviewed. Allergies and medications reviewed and updated.  Review of Systems  Constitutional:  Negative for chills and fever.  HENT:  Negative for ear pain.   Eyes:  Negative for visual disturbance.  Respiratory:  Negative for chest tightness and shortness of breath.   Cardiovascular:  Negative for chest pain and leg swelling.  Gastrointestinal:  Negative for abdominal pain.  Genitourinary:  Positive for urgency (Incontinence). Negative for dysuria and flank pain.  Musculoskeletal:  Negative for back pain and gait problem.  Skin:  Negative for rash.  Neurological:  Negative for dizziness, light-headedness and headaches.  Psychiatric/Behavioral:  Negative for agitation,  behavioral problems, dysphoric mood and sleep disturbance. The patient is not nervous/anxious.   All other systems reviewed and are negative.  Per HPI unless specifically indicated above   Allergies as of 09/20/2021       Reactions   Codeine    ? Reaction ER visit.    Mold Extract [trichophyton]    Migraine   Penicillins Other (See Comments)   "drew my legs up as child" Pt has tolerated cephalexin in the past.   Ace Inhibitors Cough   Angiotensin Receptor Blockers Cough   Levaquin [levofloxacin] Rash   Per notes from outpatient provider   Vytorin [ezetimibe-simvastatin] Other (See Comments)   cramping   Zetia [ezetimibe] Other (See Comments)   cramping        Medication List        Accurate as of September 20, 2021  4:52 PM. If you have any questions, ask your nurse or doctor.          amLODipine 10 MG tablet Commonly known as: NORVASC TAKE (1/2) TABLET DAILY.   aspirin 81 MG tablet Take 81 mg by mouth daily.   buPROPion 150 MG 24 hr tablet Commonly known as: Wellbutrin XL Take 1 tablet (150 mg total) by mouth daily.   CALCARB 600 PO Take by mouth.   donepezil 10 MG tablet Commonly known as: ARICEPT Take 10 mg by mouth at bedtime. Per patient taking 1/2 tablet   fexofenadine 180 MG tablet Commonly known as: ALLEGRA Take 180 mg by mouth daily.   fluticasone 50 MCG/ACT nasal spray Commonly known as: FLONASE Place 2 sprays into both  nostrils daily.   hydrochlorothiazide 25 MG tablet Commonly known as: HYDRODIURIL TAKE (1/2) TABLET DAILY.   HYDROcodone-acetaminophen 5-325 MG tablet Commonly known as: NORCO/VICODIN Take 1 tablet by mouth every 6 (six) hours as needed for moderate pain.   memantine 10 MG tablet Commonly known as: NAMENDA Take 1 tablet (10 mg total) by mouth 2 (two) times daily.   montelukast 10 MG tablet Commonly known as: SINGULAIR Take 1 tablet (10 mg total) by mouth at bedtime.   omega-3 acid ethyl esters 1 g capsule Commonly  known as: LOVAZA Take 2 capsules (2 g total) by mouth 2 (two) times daily.   rosuvastatin 20 MG tablet Commonly known as: CRESTOR Take 1 tablet (20 mg total) by mouth daily.   Vitamin D-3 125 MCG (5000 UT) Tabs Take 1 capsule by mouth as directed. Take one capsule by mouth daily Mon thru Friday         Objective:   BP 138/77   Pulse (!) 57   Ht _0  (1.727 m)   Wt 177 lb (80.3 kg)   SpO2 97%   BMI 26.91 kg/m   Wt Readings from Last 3 Encounters:  09/20/21 177 lb (80.3 kg)  08/28/21 175 lb 9.6 oz (79.7 kg)  08/02/21 173 lb 8 oz (78.7 kg)    Physical Exam Vitals and nursing note reviewed.  Constitutional:      General: She is not in acute distress.    Appearance: She is well-developed. She is not diaphoretic.  Eyes:     Conjunctiva/sclera: Conjunctivae normal.  Cardiovascular:     Rate and Rhythm: Normal rate and regular rhythm.     Heart sounds: Normal heart sounds. No murmur heard. Pulmonary:     Effort: Pulmonary effort is normal. No respiratory distress.     Breath sounds: Normal breath sounds. No wheezing.  Abdominal:     General: Abdomen is flat. Bowel sounds are normal. There is no distension.     Tenderness: There is no abdominal tenderness. There is no guarding or rebound.     Hernia: No hernia is present.  Musculoskeletal:        General: No tenderness. Normal range of motion.  Skin:    General: Skin is warm and dry.     Findings: No rash.  Neurological:     Mental Status: She is alert and oriented to person, place, and time.     Coordination: Coordination normal.  Psychiatric:        Behavior: Behavior normal.      Assessment & Plan:   Problem List Items Addressed This Visit       Cardiovascular and Mediastinum   Hypertension   Relevant Medications   rosuvastatin (CRESTOR) 20 MG tablet   omega-3 acid ethyl esters (LOVAZA) 1 g capsule   hydrochlorothiazide (HYDRODIURIL) 25 MG tablet   amLODipine (NORVASC) 10 MG tablet   CAD (coronary  artery disease), native coronary artery   Relevant Medications   rosuvastatin (CRESTOR) 20 MG tablet   omega-3 acid ethyl esters (LOVAZA) 1 g capsule   hydrochlorothiazide (HYDRODIURIL) 25 MG tablet   amLODipine (NORVASC) 10 MG tablet   Other Relevant Orders   CBC with Differential/Platelet     Other   Hyperlipemia - Primary   Relevant Medications   rosuvastatin (CRESTOR) 20 MG tablet   omega-3 acid ethyl esters (LOVAZA) 1 g capsule   hydrochlorothiazide (HYDRODIURIL) 25 MG tablet   amLODipine (NORVASC) 10 MG tablet   Other Relevant Orders  CBC with Differential/Platelet   CMP14+EGFR   Lipid panel   Mild cognitive impairment    No change in medication, continue medication currently. Follow up plan: Return in about 6 months (around 03/21/2022), or if symptoms worsen or fail to improve, for Hypertension and hyperlipidemia and memory.  Counseling provided for all of the vaccine components Orders Placed This Encounter  Procedures   CBC with Differential/Platelet   CMP14+EGFR   Lipid panel    Caryl Pina, MD [facility] 09/20/2021, 4:52 PM

## 2021-09-21 LAB — CBC WITH DIFFERENTIAL/PLATELET
Basophils Absolute: 0.1 10*3/uL (ref 0.0–0.2)
Basos: 1 %
EOS (ABSOLUTE): 0.1 10*3/uL (ref 0.0–0.4)
Eos: 1 %
Hematocrit: 42.2 % (ref 34.0–46.6)
Hemoglobin: 14.2 g/dL (ref 11.1–15.9)
Immature Grans (Abs): 0 10*3/uL (ref 0.0–0.1)
Immature Granulocytes: 0 %
Lymphocytes Absolute: 2.1 10*3/uL (ref 0.7–3.1)
Lymphs: 29 %
MCH: 30.9 pg (ref 26.6–33.0)
MCHC: 33.6 g/dL (ref 31.5–35.7)
MCV: 92 fL (ref 79–97)
Monocytes Absolute: 0.4 10*3/uL (ref 0.1–0.9)
Monocytes: 6 %
Neutrophils Absolute: 4.7 10*3/uL (ref 1.4–7.0)
Neutrophils: 63 %
Platelets: 264 10*3/uL (ref 150–450)
RBC: 4.6 x10E6/uL (ref 3.77–5.28)
RDW: 12.8 % (ref 11.7–15.4)
WBC: 7.4 10*3/uL (ref 3.4–10.8)

## 2021-09-21 LAB — CMP14+EGFR
ALT: 20 IU/L (ref 0–32)
AST: 19 IU/L (ref 0–40)
Albumin/Globulin Ratio: 2.5 — ABNORMAL HIGH (ref 1.2–2.2)
Albumin: 4.5 g/dL (ref 3.7–4.7)
Alkaline Phosphatase: 96 IU/L (ref 44–121)
BUN/Creatinine Ratio: 19 (ref 12–28)
BUN: 14 mg/dL (ref 8–27)
Bilirubin Total: 0.5 mg/dL (ref 0.0–1.2)
CO2: 24 mmol/L (ref 20–29)
Calcium: 10.3 mg/dL (ref 8.7–10.3)
Chloride: 104 mmol/L (ref 96–106)
Creatinine, Ser: 0.75 mg/dL (ref 0.57–1.00)
Globulin, Total: 1.8 g/dL (ref 1.5–4.5)
Glucose: 86 mg/dL (ref 70–99)
Potassium: 4.5 mmol/L (ref 3.5–5.2)
Sodium: 143 mmol/L (ref 134–144)
Total Protein: 6.3 g/dL (ref 6.0–8.5)
eGFR: 85 mL/min/{1.73_m2} (ref >59)

## 2021-09-21 LAB — LIPID PANEL
Chol/HDL Ratio: 2.4 ratio (ref 0.0–4.4)
Cholesterol, Total: 177 mg/dL (ref 100–199)
HDL: 75 mg/dL (ref >39)
LDL Chol Calc (NIH): 90 mg/dL (ref 0–99)
Triglycerides: 63 mg/dL (ref 0–149)
VLDL Cholesterol Cal: 12 mg/dL (ref 5–40)

## 2021-12-14 DIAGNOSIS — D485 Neoplasm of uncertain behavior of skin: Secondary | ICD-10-CM | POA: Diagnosis not present

## 2021-12-14 DIAGNOSIS — Z08 Encounter for follow-up examination after completed treatment for malignant neoplasm: Secondary | ICD-10-CM | POA: Diagnosis not present

## 2021-12-14 DIAGNOSIS — Z872 Personal history of diseases of the skin and subcutaneous tissue: Secondary | ICD-10-CM | POA: Diagnosis not present

## 2021-12-14 DIAGNOSIS — C44329 Squamous cell carcinoma of skin of other parts of face: Secondary | ICD-10-CM | POA: Diagnosis not present

## 2021-12-14 DIAGNOSIS — L680 Hirsutism: Secondary | ICD-10-CM | POA: Diagnosis not present

## 2021-12-14 DIAGNOSIS — Z85828 Personal history of other malignant neoplasm of skin: Secondary | ICD-10-CM | POA: Diagnosis not present

## 2022-03-01 DIAGNOSIS — H02132 Senile ectropion of right lower eyelid: Secondary | ICD-10-CM | POA: Diagnosis not present

## 2022-03-01 DIAGNOSIS — C441222 Squamous cell carcinoma of skin of right lower eyelid, including canthus: Secondary | ICD-10-CM | POA: Diagnosis not present

## 2022-03-05 ENCOUNTER — Other Ambulatory Visit: Payer: Self-pay

## 2022-03-05 ENCOUNTER — Encounter: Payer: Self-pay | Admitting: Family Medicine

## 2022-03-05 ENCOUNTER — Telehealth: Payer: Self-pay

## 2022-03-05 ENCOUNTER — Ambulatory Visit: Payer: Medicare PPO | Admitting: Family Medicine

## 2022-03-05 ENCOUNTER — Other Ambulatory Visit: Payer: Medicare PPO

## 2022-03-05 VITALS — BP 152/78 | HR 63 | Ht 68.0 in | Wt 188.0 lb

## 2022-03-05 DIAGNOSIS — Z780 Asymptomatic menopausal state: Secondary | ICD-10-CM

## 2022-03-05 DIAGNOSIS — Z01818 Encounter for other preprocedural examination: Secondary | ICD-10-CM

## 2022-03-05 LAB — COAGUCHEK XS/INR WAIVED
INR: 1 (ref 0.9–1.1)
Prothrombin Time: 12.4 s

## 2022-03-05 NOTE — Telephone Encounter (Signed)
Surgical Clearance form faxed to [facility]/ Dr. Sabino Dick at [phone]. ?

## 2022-03-05 NOTE — Progress Notes (Signed)
? ?BP (!) 152/78   Pulse 63   Ht 5' 8"  (1.727 m)   Wt 188 lb (85.3 kg)   SpO2 98%   BMI 28.59 kg/m?   ? ?Subjective:  ? ?Patient ID: Shelly Bennett, female    DOB: 02-10-1951, 71 y.o.   MRN: [id number] ? ?HPI: ?Shelly Bennett is a 71 y.o. female presenting on 03/05/2022 for Surgical Clearance (MOHS surgery scheduled in the morning for skin cancer under right eye) ? ? ?HPI ?Patient is coming in for preoperative physical exam ?She is going for Mohs surgery in the morning for skin cancer under the right eye.  She stopped her aspirin 4 days ago so tomorrow will be day 5.  She denies any chest pain or palpitations.  When she walks in here she does not get short of breath or chest pains.  Her only real complaint medically is that she gets irritable bowels occasionally. ? ?Relevant past medical, surgical, family and social history reviewed and updated as indicated. Interim medical history since our last visit reviewed. ?Allergies and medications reviewed and updated. ? ?Review of Systems  ?Constitutional:  Negative for chills and fever.  ?Eyes:  Negative for visual disturbance.  ?Respiratory:  Negative for chest tightness and shortness of breath.   ?Cardiovascular:  Negative for chest pain and leg swelling.  ?Musculoskeletal:  Negative for back pain and gait problem.  ?Skin:  Negative for rash.  ?Neurological:  Negative for dizziness, light-headedness and headaches.  ?Psychiatric/Behavioral:  Negative for agitation and behavioral problems.   ?All other systems reviewed and are negative. ? ?Per HPI unless specifically indicated above ? ? ?Allergies as of 03/05/2022   ? ?   Reactions  ? Codeine   ? ? Reaction ER visit.   ? Mold Extract [trichophyton]   ? Migraine  ? Penicillins Other (See Comments)  ? "drew my legs up as child" Pt has tolerated cephalexin in the past.  ? Ace Inhibitors Cough  ? Angiotensin Receptor Blockers Cough  ? Levaquin [levofloxacin] Rash  ? Per notes from outpatient provider  ? Vytorin  [ezetimibe-simvastatin] Other (See Comments)  ? cramping  ? Zetia [ezetimibe] Other (See Comments)  ? cramping  ? ?  ? ?  ?Medication List  ?  ? ?  ? Accurate as of March 05, 2022  2:55 PM. If you have any questions, ask your nurse or doctor.  ?  ?  ? ?  ? ?amLODipine 10 MG tablet ?Commonly known as: NORVASC ?TAKE (1/2) TABLET DAILY. ?  ?aspirin 81 MG tablet ?Take 81 mg by mouth daily. ?  ?buPROPion 150 MG 24 hr tablet ?Commonly known as: Wellbutrin XL ?Take 1 tablet (150 mg total) by mouth daily. ?  ?CALCARB 600 PO ?Take by mouth. ?  ?donepezil 10 MG tablet ?Commonly known as: ARICEPT ?Take 10 mg by mouth at bedtime. Per patient taking 1/2 tablet ?  ?fexofenadine 180 MG tablet ?Commonly known as: ALLEGRA ?Take 180 mg by mouth daily. ?  ?fluticasone 50 MCG/ACT nasal spray ?Commonly known as: FLONASE ?Place 2 sprays into both nostrils daily. ?  ?hydrochlorothiazide 25 MG tablet ?Commonly known as: HYDRODIURIL ?TAKE (1/2) TABLET DAILY. ?  ?HYDROcodone-acetaminophen 5-325 MG tablet ?Commonly known as: NORCO/VICODIN ?Take 1 tablet by mouth every 6 (six) hours as needed for moderate pain. ?  ?memantine 10 MG tablet ?Commonly known as: NAMENDA ?Take 1 tablet (10 mg total) by mouth 2 (two) times daily. ?  ?montelukast 10 MG tablet ?Commonly known as:  SINGULAIR ?Take 1 tablet (10 mg total) by mouth at bedtime. ?  ?omega-3 acid ethyl esters 1 g capsule ?Commonly known as: LOVAZA ?Take 2 capsules (2 g total) by mouth 2 (two) times daily. ?  ?rosuvastatin 20 MG tablet ?Commonly known as: CRESTOR ?Take 1 tablet (20 mg total) by mouth daily. ?  ?Vitamin D-3 125 MCG (5000 UT) Tabs ?Take 1 capsule by mouth as directed. Take one capsule by mouth daily Mon thru Friday ?  ? ?  ? ? ? ?Objective:  ? ?BP (!) 152/78   Pulse 63   Ht 5' 8"  (1.727 m)   Wt 188 lb (85.3 kg)   SpO2 98%   BMI 28.59 kg/m?   ?Wt Readings from Last 3 Encounters:  ?03/05/22 188 lb (85.3 kg)  ?09/20/21 177 lb (80.3 kg)  ?08/28/21 175 lb 9.6 oz (79.7 kg)  ?   ?Physical Exam ?Vitals and nursing note reviewed.  ?Constitutional:   ?   General: She is not in acute distress. ?   Appearance: She is well-developed. She is not diaphoretic.  ?Eyes:  ?   Conjunctiva/sclera: Conjunctivae normal.  ?Cardiovascular:  ?   Rate and Rhythm: Normal rate and regular rhythm.  ?   Heart sounds: Normal heart sounds. No murmur heard. ?Pulmonary:  ?   Effort: Pulmonary effort is normal. No respiratory distress.  ?   Breath sounds: Normal breath sounds. No wheezing.  ?Musculoskeletal:     ?   General: No swelling or tenderness. Normal range of motion.  ?Skin: ?   General: Skin is warm and dry.  ?   Findings: No rash.  ?Neurological:  ?   Mental Status: She is alert and oriented to person, place, and time.  ?   Coordination: Coordination normal.  ?Psychiatric:     ?   Behavior: Behavior normal.  ? ? ? ? ?Assessment & Plan:  ? ?Problem List Items Addressed This Visit   ?None ?Visit Diagnoses   ? ? Preop examination    -  Primary  ? Relevant Orders  ? CBC with Differential/Platelet  ? CMP14+EGFR  ? CoaguChek XS/INR Waived  ? ?  ?  ?Cleared for surgery as long as blood work comes back good, pending lab work. ? ?BP was 132/75 on recheck so was good. ?Follow up plan: ?Return if symptoms worsen or fail to improve. ? ?Counseling provided for all of the vaccine components ?Orders Placed This Encounter  ?Procedures  ? CBC with Differential/Platelet  ? CMP14+EGFR  ? CoaguChek XS/INR Waived  ? ? ?Caryl Pina, MD ?[place] ?03/05/2022, 2:55 PM ? ? ? ? ?

## 2022-03-06 DIAGNOSIS — D0439 Carcinoma in situ of skin of other parts of face: Secondary | ICD-10-CM | POA: Diagnosis not present

## 2022-03-06 LAB — CBC WITH DIFFERENTIAL/PLATELET
Basophils Absolute: 0 10*3/uL (ref 0.0–0.2)
Basos: 1 %
EOS (ABSOLUTE): 0.1 10*3/uL (ref 0.0–0.4)
Eos: 1 %
Hematocrit: 41.9 % (ref 34.0–46.6)
Hemoglobin: 14.4 g/dL (ref 11.1–15.9)
Immature Grans (Abs): 0 10*3/uL (ref 0.0–0.1)
Immature Granulocytes: 0 %
Lymphocytes Absolute: 1.6 10*3/uL (ref 0.7–3.1)
Lymphs: 27 %
MCH: 31.3 pg (ref 26.6–33.0)
MCHC: 34.4 g/dL (ref 31.5–35.7)
MCV: 91 fL (ref 79–97)
Monocytes Absolute: 0.3 10*3/uL (ref 0.1–0.9)
Monocytes: 5 %
Neutrophils Absolute: 3.8 10*3/uL (ref 1.4–7.0)
Neutrophils: 66 %
Platelets: 268 10*3/uL (ref 150–450)
RBC: 4.6 x10E6/uL (ref 3.77–5.28)
RDW: 13.4 % (ref 11.7–15.4)
WBC: 5.8 10*3/uL (ref 3.4–10.8)

## 2022-03-06 LAB — CMP14+EGFR
ALT: 12 IU/L (ref 0–32)
AST: 15 IU/L (ref 0–40)
Albumin/Globulin Ratio: 2.2 (ref 1.2–2.2)
Albumin: 4.2 g/dL (ref 3.7–4.7)
Alkaline Phosphatase: 96 IU/L (ref 44–121)
BUN/Creatinine Ratio: 17 (ref 12–28)
BUN: 12 mg/dL (ref 8–27)
Bilirubin Total: 0.4 mg/dL (ref 0.0–1.2)
CO2: 25 mmol/L (ref 20–29)
Calcium: 9.6 mg/dL (ref 8.7–10.3)
Chloride: 107 mmol/L — ABNORMAL HIGH (ref 96–106)
Creatinine, Ser: 0.69 mg/dL (ref 0.57–1.00)
Globulin, Total: 1.9 g/dL (ref 1.5–4.5)
Glucose: 104 mg/dL — ABNORMAL HIGH (ref 70–99)
Potassium: 4.3 mmol/L (ref 3.5–5.2)
Sodium: 146 mmol/L — ABNORMAL HIGH (ref 134–144)
Total Protein: 6.1 g/dL (ref 6.0–8.5)
eGFR: 93 mL/min/{1.73_m2} (ref >59)

## 2022-03-07 DIAGNOSIS — H02132 Senile ectropion of right lower eyelid: Secondary | ICD-10-CM | POA: Diagnosis not present

## 2022-03-07 DIAGNOSIS — S01411A Laceration without foreign body of right cheek and temporomandibular area, initial encounter: Secondary | ICD-10-CM | POA: Diagnosis not present

## 2022-03-07 DIAGNOSIS — Z483 Aftercare following surgery for neoplasm: Secondary | ICD-10-CM | POA: Diagnosis not present

## 2022-03-07 DIAGNOSIS — Z481 Encounter for planned postprocedural wound closure: Secondary | ICD-10-CM | POA: Diagnosis not present

## 2022-03-07 DIAGNOSIS — C441222 Squamous cell carcinoma of skin of right lower eyelid, including canthus: Secondary | ICD-10-CM | POA: Diagnosis not present

## 2022-03-07 DIAGNOSIS — S0181XA Laceration without foreign body of other part of head, initial encounter: Secondary | ICD-10-CM | POA: Diagnosis not present

## 2022-03-07 DIAGNOSIS — Z85828 Personal history of other malignant neoplasm of skin: Secondary | ICD-10-CM | POA: Diagnosis not present

## 2022-03-07 DIAGNOSIS — H0289 Other specified disorders of eyelid: Secondary | ICD-10-CM | POA: Diagnosis not present

## 2022-03-14 DIAGNOSIS — Z872 Personal history of diseases of the skin and subcutaneous tissue: Secondary | ICD-10-CM | POA: Diagnosis not present

## 2022-03-14 DIAGNOSIS — L814 Other melanin hyperpigmentation: Secondary | ICD-10-CM | POA: Diagnosis not present

## 2022-03-14 DIAGNOSIS — L708 Other acne: Secondary | ICD-10-CM | POA: Diagnosis not present

## 2022-03-14 DIAGNOSIS — Z85828 Personal history of other malignant neoplasm of skin: Secondary | ICD-10-CM | POA: Diagnosis not present

## 2022-03-14 DIAGNOSIS — D225 Melanocytic nevi of trunk: Secondary | ICD-10-CM | POA: Diagnosis not present

## 2022-03-14 DIAGNOSIS — L821 Other seborrheic keratosis: Secondary | ICD-10-CM | POA: Diagnosis not present

## 2022-03-14 DIAGNOSIS — Z08 Encounter for follow-up examination after completed treatment for malignant neoplasm: Secondary | ICD-10-CM | POA: Diagnosis not present

## 2022-03-22 ENCOUNTER — Other Ambulatory Visit: Payer: Medicare PPO

## 2022-03-23 ENCOUNTER — Other Ambulatory Visit: Payer: Medicare PPO

## 2022-03-26 ENCOUNTER — Other Ambulatory Visit: Payer: Medicare PPO

## 2022-03-26 ENCOUNTER — Other Ambulatory Visit (INDEPENDENT_AMBULATORY_CARE_PROVIDER_SITE_OTHER): Payer: Medicare PPO

## 2022-03-26 DIAGNOSIS — Z780 Asymptomatic menopausal state: Secondary | ICD-10-CM | POA: Diagnosis not present

## 2022-03-28 DIAGNOSIS — M8589 Other specified disorders of bone density and structure, multiple sites: Secondary | ICD-10-CM | POA: Diagnosis not present

## 2022-03-28 DIAGNOSIS — Z780 Asymptomatic menopausal state: Secondary | ICD-10-CM | POA: Diagnosis not present

## 2022-04-18 DIAGNOSIS — B0050 Herpesviral ocular disease, unspecified: Secondary | ICD-10-CM | POA: Diagnosis not present

## 2022-04-18 DIAGNOSIS — H2513 Age-related nuclear cataract, bilateral: Secondary | ICD-10-CM | POA: Diagnosis not present

## 2022-04-18 DIAGNOSIS — H02403 Unspecified ptosis of bilateral eyelids: Secondary | ICD-10-CM | POA: Diagnosis not present

## 2022-05-17 ENCOUNTER — Ambulatory Visit: Payer: Medicare PPO | Admitting: Family Medicine

## 2022-05-17 ENCOUNTER — Encounter: Payer: Self-pay | Admitting: Family Medicine

## 2022-05-17 VITALS — BP 169/91 | HR 63 | Temp 98.0°F | Ht 68.0 in | Wt 193.0 lb

## 2022-05-17 DIAGNOSIS — M8580 Other specified disorders of bone density and structure, unspecified site: Secondary | ICD-10-CM

## 2022-05-17 DIAGNOSIS — E782 Mixed hyperlipidemia: Secondary | ICD-10-CM | POA: Diagnosis not present

## 2022-05-17 DIAGNOSIS — I10 Essential (primary) hypertension: Secondary | ICD-10-CM | POA: Diagnosis not present

## 2022-05-17 NOTE — Progress Notes (Signed)
BP (!) 172/96   Pulse 63   Temp 98 F (36.7 C)   Ht 5' 8" (1.727 m)   Wt 193 lb (87.5 kg)   SpO2 99%   BMI 29.35 kg/m    Subjective:   Patient ID: Shelly Bennett, female    DOB: 1951-05-04, 71 y.o.   MRN: [id number]  HPI: Shelly Bennett is a 71 y.o. female presenting on 05/17/2022 for Medical Management of Chronic Issues, Hyperlipidemia, and Hypertension   HPI Hypertension Patient is currently on amlodipine and hydrochlorothiazide, and their blood pressure today is 172/96. Patient denies any lightheadedness or dizziness. Patient denies headaches, blurred vision, chest pains, shortness of breath, or weakness. Denies any side effects from medication and is content with current medication.   Hyperlipidemia Patient is coming in for recheck of his hyperlipidemia. The patient is currently taking Crestor and fish oils. They deny any issues with myalgias or history of liver damage from it. They deny any focal numbness or weakness or chest pain.   Osteoporosis/osteopenia Fractures or history of fracture: None Medication: Calcium and vitamin D although we did discuss Fosamax as a possibility. Duration of treatment: Few years Last bone density scan: 1 month ago, T score was -2.3 which was worse from -1.7 a few years ago Last T score: -2.3  She does feel like her memory is worsening despite the medicine to help and does not feel like it is helping as much but she wants to continue forward with it.  She forgets things all the time and loses things all the time and it gets frustrating to her.  Relevant past medical, surgical, family and social history reviewed and updated as indicated. Interim medical history since our last visit reviewed. Allergies and medications reviewed and updated.  Review of Systems  Constitutional:  Negative for chills and fever.  Eyes:  Negative for visual disturbance.  Respiratory:  Negative for chest tightness and shortness of breath.    Cardiovascular:  Negative for chest pain and leg swelling.  Genitourinary:  Negative for difficulty urinating and dysuria.  Musculoskeletal:  Negative for back pain and gait problem.  Skin:  Negative for rash.  Neurological:  Negative for light-headedness and headaches.  Psychiatric/Behavioral:  Positive for confusion. Negative for agitation, behavioral problems, decreased concentration, dysphoric mood, self-injury, sleep disturbance and suicidal ideas. The patient is not nervous/anxious.   All other systems reviewed and are negative.  Per HPI unless specifically indicated above   Allergies as of 05/17/2022       Reactions   Codeine    ? Reaction ER visit.    Mold Extract [trichophyton]    Migraine   Penicillins Other (See Comments)   "drew my legs up as child" Pt has tolerated cephalexin in the past.   Ace Inhibitors Cough   Angiotensin Receptor Blockers Cough   Levaquin [levofloxacin] Rash   Per notes from outpatient provider   Vytorin [ezetimibe-simvastatin] Other (See Comments)   cramping   Zetia [ezetimibe] Other (See Comments)   cramping        Medication List        Accurate as of May 17, 2022 11:14 AM. If you have any questions, ask your nurse or doctor.          amLODipine 10 MG tablet Commonly known as: NORVASC TAKE (1/2) TABLET DAILY.   aspirin 81 MG tablet Take 81 mg by mouth daily.   buPROPion 150 MG 24 hr tablet Commonly known as: Wellbutrin XL Take  1 tablet (150 mg total) by mouth daily.   CALCARB 600 PO Take by mouth.   donepezil 10 MG tablet Commonly known as: ARICEPT Take 10 mg by mouth at bedtime. Per patient taking 1/2 tablet   fexofenadine 180 MG tablet Commonly known as: ALLEGRA Take 180 mg by mouth daily.   fluticasone 50 MCG/ACT nasal spray Commonly known as: FLONASE Place 2 sprays into both nostrils daily.   hydrochlorothiazide 25 MG tablet Commonly known as: HYDRODIURIL TAKE (1/2) TABLET DAILY.    HYDROcodone-acetaminophen 5-325 MG tablet Commonly known as: NORCO/VICODIN Take 1 tablet by mouth every 6 (six) hours as needed for moderate pain.   memantine 10 MG tablet Commonly known as: NAMENDA Take 1 tablet (10 mg total) by mouth 2 (two) times daily.   montelukast 10 MG tablet Commonly known as: SINGULAIR Take 1 tablet (10 mg total) by mouth at bedtime.   omega-3 acid ethyl esters 1 g capsule Commonly known as: LOVAZA Take 2 capsules (2 g total) by mouth 2 (two) times daily.   rosuvastatin 20 MG tablet Commonly known as: CRESTOR Take 1 tablet (20 mg total) by mouth daily.   Vitamin D-3 125 MCG (5000 UT) Tabs Take 1 capsule by mouth as directed. Take one capsule by mouth daily Mon thru Friday         Objective:   BP (!) 172/96   Pulse 63   Temp 98 F (36.7 C)   Ht 5' 8" (1.727 m)   Wt 193 lb (87.5 kg)   SpO2 99%   BMI 29.35 kg/m   Wt Readings from Last 3 Encounters:  05/17/22 193 lb (87.5 kg)  03/05/22 188 lb (85.3 kg)  09/20/21 177 lb (80.3 kg)    Physical Exam Vitals and nursing note reviewed.  Constitutional:      General: She is not in acute distress.    Appearance: She is well-developed. She is not diaphoretic.  Eyes:     Conjunctiva/sclera: Conjunctivae normal.  Cardiovascular:     Rate and Rhythm: Normal rate and regular rhythm.     Heart sounds: Normal heart sounds. No murmur heard. Pulmonary:     Effort: Pulmonary effort is normal. No respiratory distress.     Breath sounds: Normal breath sounds. No wheezing.  Musculoskeletal:        General: No tenderness. Normal range of motion.  Skin:    General: Skin is warm and dry.     Findings: No rash.  Neurological:     Mental Status: She is alert and oriented to person, place, and time.     Coordination: Coordination normal.  Psychiatric:        Behavior: Behavior normal.        Cognition and Memory: She exhibits impaired recent memory.      Assessment & Plan:   Problem List Items  Addressed This Visit       Cardiovascular and Mediastinum   Hypertension   Relevant Orders   CBC with Differential/Platelet   CMP14+EGFR     Musculoskeletal and Integument   Osteopenia   Relevant Orders   CBC with Differential/Platelet   VITAMIN D 25 Hydroxy (Vit-D Deficiency, Fractures)     Other   Hyperlipemia - Primary   Relevant Orders   CBC with Differential/Platelet   Lipid panel    Memory getting slightly worse per patient, she is already on both memory medicine so nothing to change at this point.  Blood pressure was up, recommend for her to  check it every day for the next couple weeks and call me some numbers.  She was very frantic Follow up plan: Return in about 6 months (around 11/16/2022), or if symptoms worsen or fail to improve, for Hypertension and memory and hyperlipidemia.  Counseling provided for all of the vaccine components Orders Placed This Encounter  Procedures   CBC with Differential/Platelet   CMP14+EGFR   Lipid panel   VITAMIN D 25 Hydroxy (Vit-D Deficiency, Fractures)    Caryl Pina, MD [facility] 05/17/2022, 11:14 AM

## 2022-05-18 LAB — LIPID PANEL
Chol/HDL Ratio: 2.9 ratio (ref 0.0–4.4)
Cholesterol, Total: 207 mg/dL — ABNORMAL HIGH (ref 100–199)
HDL: 72 mg/dL (ref >39)
LDL Chol Calc (NIH): 119 mg/dL — ABNORMAL HIGH (ref 0–99)
Triglycerides: 93 mg/dL (ref 0–149)
VLDL Cholesterol Cal: 16 mg/dL (ref 5–40)

## 2022-05-18 LAB — CBC WITH DIFFERENTIAL/PLATELET
Basophils Absolute: 0.1 10*3/uL (ref 0.0–0.2)
Basos: 1 %
EOS (ABSOLUTE): 0.1 10*3/uL (ref 0.0–0.4)
Eos: 2 %
Hematocrit: 40.9 % (ref 34.0–46.6)
Hemoglobin: 13.8 g/dL (ref 11.1–15.9)
Immature Grans (Abs): 0 10*3/uL (ref 0.0–0.1)
Immature Granulocytes: 1 %
Lymphocytes Absolute: 1.4 10*3/uL (ref 0.7–3.1)
Lymphs: 27 %
MCH: 30.7 pg (ref 26.6–33.0)
MCHC: 33.7 g/dL (ref 31.5–35.7)
MCV: 91 fL (ref 79–97)
Monocytes Absolute: 0.4 10*3/uL (ref 0.1–0.9)
Monocytes: 7 %
Neutrophils Absolute: 3.3 10*3/uL (ref 1.4–7.0)
Neutrophils: 62 %
Platelets: 291 10*3/uL (ref 150–450)
RBC: 4.49 x10E6/uL (ref 3.77–5.28)
RDW: 13.4 % (ref 11.7–15.4)
WBC: 5.3 10*3/uL (ref 3.4–10.8)

## 2022-05-18 LAB — CMP14+EGFR
ALT: 15 IU/L (ref 0–32)
AST: 14 IU/L (ref 0–40)
Albumin/Globulin Ratio: 1.8 (ref 1.2–2.2)
Albumin: 4 g/dL (ref 3.7–4.7)
Alkaline Phosphatase: 99 IU/L (ref 44–121)
BUN/Creatinine Ratio: 15 (ref 12–28)
BUN: 12 mg/dL (ref 8–27)
Bilirubin Total: 0.4 mg/dL (ref 0.0–1.2)
CO2: 23 mmol/L (ref 20–29)
Calcium: 9.4 mg/dL (ref 8.7–10.3)
Chloride: 107 mmol/L — ABNORMAL HIGH (ref 96–106)
Creatinine, Ser: 0.78 mg/dL (ref 0.57–1.00)
Globulin, Total: 2.2 g/dL (ref 1.5–4.5)
Glucose: 89 mg/dL (ref 70–99)
Potassium: 4.4 mmol/L (ref 3.5–5.2)
Sodium: 143 mmol/L (ref 134–144)
Total Protein: 6.2 g/dL (ref 6.0–8.5)
eGFR: 81 mL/min/{1.73_m2} (ref >59)

## 2022-05-18 LAB — VITAMIN D 25 HYDROXY (VIT D DEFICIENCY, FRACTURES): Vit D, 25-Hydroxy: 48.7 ng/mL (ref 30.0–100.0)

## 2022-05-23 DIAGNOSIS — B0050 Herpesviral ocular disease, unspecified: Secondary | ICD-10-CM | POA: Diagnosis not present

## 2022-05-23 DIAGNOSIS — H2513 Age-related nuclear cataract, bilateral: Secondary | ICD-10-CM | POA: Diagnosis not present

## 2022-05-23 DIAGNOSIS — H02403 Unspecified ptosis of bilateral eyelids: Secondary | ICD-10-CM | POA: Diagnosis not present

## 2022-06-21 ENCOUNTER — Ambulatory Visit: Payer: Medicare PPO

## 2022-07-24 ENCOUNTER — Ambulatory Visit (INDEPENDENT_AMBULATORY_CARE_PROVIDER_SITE_OTHER): Payer: Medicare PPO

## 2022-07-24 VITALS — Wt 193.0 lb

## 2022-07-24 DIAGNOSIS — Z0000 Encounter for general adult medical examination without abnormal findings: Secondary | ICD-10-CM | POA: Diagnosis not present

## 2022-07-24 NOTE — Patient Instructions (Signed)
Shelly Bennett , Thank you for taking time to come for your Medicare Wellness Visit. I appreciate your ongoing commitment to your health goals. Please review the following plan we discussed and let me know if I can assist you in the future.   Screening recommendations/referrals: Colonoscopy: Done 08/05/2019 - repeat in 3 years *now due - call for appointment soon Mammogram: Done 01/20/2021 - Repeat annually *I made you an appointment for August 25 @ 10:40 with the mobile unit at [place]: 03/28/2022 - Repeat every 2 years  Recommended yearly ophthalmology/optometry visit for glaucoma screening and checkup Recommended yearly dental visit for hygiene and checkup  Vaccinations: Influenza vaccine: Done 10/07/2021 - Repeat annually  Pneumococcal vaccine: Done 11/01/2015 & 09/04/2017 Tdap vaccine: Done 05/01/2017 - Repeat in 10 years  Shingles vaccine: Due - Shingrix is 2 doses 2-6 months apart and over 90% effective     Covid-19: Done 05/02/2020 - bad reaction - no repeat recommended  Advanced directives: Please bring a copy of your health care power of attorney and living will to the office to be added to your chart at your convenience.   Conditions/risks identified: Consider getting [facility] to package all of your medications in foil packages - this may help you remember to take them. (pill packaging program)  Next appointment: Follow up in one year for your annual wellness visit    Preventive Care 65 Years and Older, Female Preventive care refers to lifestyle choices and visits with your health care provider that can promote health and wellness. What does preventive care include? A yearly physical exam. This is also called an annual well check. Dental exams once or twice a year. Routine eye exams. Ask your health care provider how often you should have your eyes checked. Personal lifestyle choices, including: Daily care of your teeth and gums. Regular physical  activity. Eating a healthy diet. Avoiding tobacco and drug use. Limiting alcohol use. Practicing safe sex. Taking low-dose aspirin every day. Taking vitamin and mineral supplements as recommended by your health care provider. What happens during an annual well check? The services and screenings done by your health care provider during your annual well check will depend on your age, overall health, lifestyle risk factors, and family history of disease. Counseling  Your health care provider may ask you questions about your: Alcohol use. Tobacco use. Drug use. Emotional well-being. Home and relationship well-being. Sexual activity. Eating habits. History of falls. Memory and ability to understand (cognition). Work and work Statistician. Reproductive health. Screening  You may have the following tests or measurements: Height, weight, and BMI. Blood pressure. Lipid and cholesterol levels. These may be checked every 5 years, or more frequently if you are over 31 years old. Skin check. Lung cancer screening. You may have this screening every year starting at age 35 if you have a 30-pack-year history of smoking and currently smoke or have quit within the past 15 years. Fecal occult blood test (FOBT) of the stool. You may have this test every year starting at age 21. Flexible sigmoidoscopy or colonoscopy. You may have a sigmoidoscopy every 5 years or a colonoscopy every 10 years starting at age 28. Hepatitis C blood test. Hepatitis B blood test. Sexually transmitted disease (STD) testing. Diabetes screening. This is done by checking your blood sugar (glucose) after you have not eaten for a while (fasting). You may have this done every 1-3 years. Bone density scan. This is done to screen for osteoporosis. You may have  this done starting at age 66. Mammogram. This may be done every 1-2 years. Talk to your health care provider about how often you should have regular mammograms. Talk with your  health care provider about your test results, treatment options, and if necessary, the need for more tests. Vaccines  Your health care provider may recommend certain vaccines, such as: Influenza vaccine. This is recommended every year. Tetanus, diphtheria, and acellular pertussis (Tdap, Td) vaccine. You may need a Td booster every 10 years. Zoster vaccine. You may need this after age 63. Pneumococcal 13-valent conjugate (PCV13) vaccine. One dose is recommended after age 45. Pneumococcal polysaccharide (PPSV23) vaccine. One dose is recommended after age 39. Talk to your health care provider about which screenings and vaccines you need and how often you need them. This information is not intended to replace advice given to you by your health care provider. Make sure you discuss any questions you have with your health care provider. Document Released: 12/30/2015 Document Revised: 08/22/2016 Document Reviewed: 10/04/2015 Elsevier Interactive Patient Education  2017 [place] Prevention in the Home Falls can cause injuries. They can happen to people of all ages. There are many things you can do to make your home safe and to help prevent falls. What can I do on the outside of my home? Regularly fix the edges of walkways and driveways and fix any cracks. Remove anything that might make you trip as you walk through a door, such as a raised step or threshold. Trim any bushes or trees on the path to your home. Use bright outdoor lighting. Clear any walking paths of anything that might make someone trip, such as rocks or tools. Regularly check to see if handrails are loose or broken. Make sure that both sides of any steps have handrails. Any raised decks and porches should have guardrails on the edges. Have any leaves, snow, or ice cleared regularly. Use sand or salt on walking paths during winter. Clean up any spills in your garage right away. This includes oil or grease spills. What can I  do in the bathroom? Use night lights. Install grab bars by the toilet and in the tub and shower. Do not use towel bars as grab bars. Use non-skid mats or decals in the tub or shower. If you need to sit down in the shower, use a plastic, non-slip stool. Keep the floor dry. Clean up any water that spills on the floor as soon as it happens. Remove soap buildup in the tub or shower regularly. Attach bath mats securely with double-sided non-slip rug tape. Do not have throw rugs and other things on the floor that can make you trip. What can I do in the bedroom? Use night lights. Make sure that you have a light by your bed that is easy to reach. Do not use any sheets or blankets that are too big for your bed. They should not hang down onto the floor. Have a firm chair that has side arms. You can use this for support while you get dressed. Do not have throw rugs and other things on the floor that can make you trip. What can I do in the kitchen? Clean up any spills right away. Avoid walking on wet floors. Keep items that you use a lot in easy-to-reach places. If you need to reach something above you, use a strong step stool that has a grab bar. Keep electrical cords out of the way. Do not use floor polish or  wax that makes floors slippery. If you must use wax, use non-skid floor wax. Do not have throw rugs and other things on the floor that can make you trip. What can I do with my stairs? Do not leave any items on the stairs. Make sure that there are handrails on both sides of the stairs and use them. Fix handrails that are broken or loose. Make sure that handrails are as long as the stairways. Check any carpeting to make sure that it is firmly attached to the stairs. Fix any carpet that is loose or worn. Avoid having throw rugs at the top or bottom of the stairs. If you do have throw rugs, attach them to the floor with carpet tape. Make sure that you have a light switch at the top of the stairs  and the bottom of the stairs. If you do not have them, ask someone to add them for you. What else can I do to help prevent falls? Wear shoes that: Do not have high heels. Have rubber bottoms. Are comfortable and fit you well. Are closed at the toe. Do not wear sandals. If you use a stepladder: Make sure that it is fully opened. Do not climb a closed stepladder. Make sure that both sides of the stepladder are locked into place. Ask someone to hold it for you, if possible. Clearly mark and make sure that you can see: Any grab bars or handrails. First and last steps. Where the edge of each step is. Use tools that help you move around (mobility aids) if they are needed. These include: Canes. Walkers. Scooters. Crutches. Turn on the lights when you go into a dark area. Replace any light bulbs as soon as they burn out. Set up your furniture so you have a clear path. Avoid moving your furniture around. If any of your floors are uneven, fix them. If there are any pets around you, be aware of where they are. Review your medicines with your doctor. Some medicines can make you feel dizzy. This can increase your chance of falling. Ask your doctor what other things that you can do to help prevent falls. This information is not intended to replace advice given to you by your health care provider. Make sure you discuss any questions you have with your health care provider. Document Released: 09/29/2009 Document Revised: 05/10/2016 Document Reviewed: 01/07/2015 Elsevier Interactive Patient Education  2017 [employer].

## 2022-07-24 NOTE — Progress Notes (Signed)
Subjective:   Shelly Bennett is a 71 y.o. female who presents for Medicare Annual (Subsequent) preventive examination.  Virtual Visit via Telephone Note  I connected with  Shelly Bennett on 07/24/22 at  2:00 PM EDT by telephone and verified that I am speaking with the correct person using two identifiers.  Location: Patient: Home Provider: [facility] Persons participating in the virtual visit: patient/Nurse Health Advisor   I discussed the limitations, risks, security and privacy concerns of performing an evaluation and management service by telephone and the availability of in person appointments. The patient expressed understanding and agreed to proceed.  Interactive audio and video telecommunications were attempted between this nurse and patient, however failed, due to patient having technical difficulties OR patient did not have access to video capability.  We continued and completed visit with audio only.  Some vital signs may be absent or patient reported.   Shelly Mellette E Corey Laski, LPN   Review of Systems     Cardiac Risk Factors include: advanced age (>84mn, >>75women);dyslipidemia;hypertension;sedentary lifestyle;Other (see comment), Risk factor comments: CAD, atherosclerosis     Objective:    Today's Vitals   07/24/22 1405  Weight: 193 lb (87.5 kg)  PainSc: 6    Body mass index is 29.35 kg/m.     07/24/2022    2:15 PM 06/20/2021    2:55 PM 10/13/2020    1:17 PM 07/13/2020   10:40 AM 06/16/2020   10:35 AM 02/10/2020    3:39 PM 11/24/2019    1:10 PM  Advanced Directives  Does Patient Have a Medical Advance Directive? Yes Yes Yes No No No Yes  Type of [place]     Living will  Copy of [place] Chart? No - copy requested No - copy requested       Would patient like information on creating a medical advance directive?     No - Patient declined No - Patient  declined     Current Medications (verified) Outpatient Encounter Medications as of 07/24/2022  Medication Sig   amLODipine (NORVASC) 10 MG tablet TAKE (1/2) TABLET DAILY.   aspirin 81 MG tablet Take 81 mg by mouth daily.    buPROPion (WELLBUTRIN XL) 150 MG 24 hr tablet Take 1 tablet (150 mg total) by mouth daily.   Calcium Carbonate (CALCARB 600 PO) Take by mouth.   Cholecalciferol (VITAMIN D-3) 5000 UNITS TABS Take 1 capsule by mouth as directed. Take one capsule by mouth daily Mon thru Friday   donepezil (ARICEPT) 10 MG tablet Take 10 mg by mouth at bedtime. Per patient taking 1/2 tablet   fexofenadine (ALLEGRA) 180 MG tablet Take 180 mg by mouth daily.   fluticasone (FLONASE) 50 MCG/ACT nasal spray Place 2 sprays into both nostrils daily.   hydrochlorothiazide (HYDRODIURIL) 25 MG tablet TAKE (1/2) TABLET DAILY.   HYDROcodone-acetaminophen (NORCO/VICODIN) 5-325 MG tablet Take 1 tablet by mouth every 6 (six) hours as needed for moderate pain.   memantine (NAMENDA) 10 MG tablet Take 1 tablet (10 mg total) by mouth 2 (two) times daily.   montelukast (SINGULAIR) 10 MG tablet Take 1 tablet (10 mg total) by mouth at bedtime.   omega-3 acid ethyl esters (LOVAZA) 1 g capsule Take 2 capsules (2 g total) by mouth 2 (two) times daily.   rosuvastatin (CRESTOR) 20 MG tablet Take 1 tablet (20 mg total) by mouth daily.   SF 5000 PLUS 1.1 %  CREA dental cream Take by mouth as directed.   No facility-administered encounter medications on file as of 07/24/2022.    Allergies (verified) Neomycin-polymyxin-dexameth, Codeine, Mold extract [trichophyton], Penicillins, Ace inhibitors, Angiotensin receptor blockers, Levaquin [levofloxacin], Vytorin [ezetimibe-simvastatin], and Zetia [ezetimibe]   History: Past Medical History:  Diagnosis Date   Adenomatous colon polyp    Allergic drug reaction 06/25/2015   Allergy    Anxiety    Arthritis    At risk for falls    fallen 4 times recently    CAD (coronary artery  disease)    Dr. Burt Knack follows    Carpal tunnel syndrome    Chronic headaches    Coccydynia 02/01/2012   Cystocele    Diverticulosis    External hemorrhoids    Focal nodular hyperplasia of liver    Gait abnormality    GERD (gastroesophageal reflux disease)    some   HCAP (healthcare-associated pneumonia) 06/25/2015   Heart murmur    HLD (hyperlipidemia)    HTN (hypertension)    IBS (irritable bowel syndrome)    Memory loss    Neuromuscular disorder (HCC)    some neuropathy issues in the past- legs fibromyalgia in past-- past hx of steroid injections   Obesity    Osteoporosis    Otitis of left ear 02/01/2017   Pneumonia    Skin cancer    squamous cell - face   Past Surgical History:  Procedure Laterality Date   CARPAL TUNNEL RELEASE     COLONOSCOPY  12/15/2008   diverticulosis, external hemorrhoids   KNEE SURGERY  05/22/2015   right   LAPAROSCOPY     SKIN SURGERY     squamous cell - right face    Family History  Problem Relation Age of Onset   Prostate cancer Father    Colon cancer Father 65   Kidney disease Father    Heart disease Father    Alzheimer's disease Father    Hyperlipidemia Mother    Hypertension Mother    Kidney disease Mother    Osteoporosis Mother    Heart murmur Mother    Dementia Mother    Hyperlipidemia Sister    Hypertension Sister    Heart disease Brother 78       not dx questionable    Heart disease Maternal Grandfather    Rectal cancer Maternal Grandmother 31   Colon polyps Neg Hx    Esophageal cancer Neg Hx    Stomach cancer Neg Hx    Social History   Socioeconomic History   Marital status: Married    Spouse name: [name]   Number of children: 4   Years of education: 16   Highest education level: Bachelor's degree (e.g., BA, AB, BS)  Occupational History   Occupation: Pharmacist, hospital retired  Tobacco Use   Smoking status: Never   Smokeless tobacco: Never  Vaping Use   Vaping Use: Never used  Substance and Sexual Activity   Alcohol  use: No   Drug use: No   Sexual activity: Not Currently    Birth control/protection: None  Other Topics Concern   Not on file  Social History Narrative   Lives at home with her husband.   Right-handed.   1 cup coffee each morning. 2-3 small soda cans each day.   Social Determinants of Health   Financial Resource Strain: Low Risk  (07/24/2022)   Overall Financial Resource Strain (CARDIA)    Difficulty of Paying Living Expenses: Not hard at all  Food  Insecurity: No Food Insecurity (07/24/2022)   Hunger Vital Sign    Worried About Running Out of Food in the Last Year: Never true    Ran Out of Food in the Last Year: Never true  Transportation Needs: No Transportation Needs (07/24/2022)   PRAPARE - Hydrologist (Medical): No    Lack of Transportation (Non-Medical): No  Physical Activity: Insufficiently Active (07/24/2022)   Exercise Vital Sign    Days of Exercise per Week: 6 days    Minutes of Exercise per Session: 20 min  Stress: No Stress Concern Present (07/24/2022)   [place]    Feeling of Stress : Only a little  Social Connections: Socially Integrated (07/24/2022)   Social Connection and Isolation Panel [NHANES]    Frequency of Communication with Friends and Family: More than three times a week    Frequency of Social Gatherings with Friends and Family: More than three times a week    Attends Religious Services: More than 4 times per year    Active Member of [employer] or Organizations: Yes    Attends Music therapist: More than 4 times per year    Marital Status: Married    Tobacco Counseling Counseling given: Not Answered   Clinical Intake:  Pre-visit preparation completed: Yes  Pain : 0-10 Pain Score: 6  Pain Type: Chronic pain Pain Location: Hip Pain Orientation: Left Pain Descriptors / Indicators: Aching, Shooting Pain Onset: More than a month ago Pain Frequency:  Intermittent     BMI - recorded: 29.35 Nutritional Status: BMI 25 -29 Overweight Nutritional Risks: None Diabetes: No  How often do you need to have someone help you when you read instructions, pamphlets, or other written materials from your doctor or pharmacy?: 1 - Never  Diabetic? no  Interpreter Needed?: No  Information entered by :: Tobin Witucki, LPN   Activities of Daily Living    07/24/2022    2:11 PM  In your present state of health, do you have any difficulty performing the following activities:  Hearing? 0  Vision? 0  Difficulty concentrating or making decisions? 1  Walking or climbing stairs? 1  Dressing or bathing? 0  Doing errands, shopping? 1  Preparing Food and eating ? N  Using the Toilet? N  In the past six months, have you accidently leaked urine? Y  Comment wears pads for protection  Do you have problems with loss of bowel control? N  Managing your Medications? Y  Comment daughter helps *daughter is an Therapist, sports*  Armed forces operational officer? Y  Housekeeping or managing your Housekeeping? Y    Patient Care Team: Dettinger, Fransisca Kaufmann, MD as PCP - General (Family Medicine) Sherren Mocha, MD as PCP - Cardiology (Cardiology) Hillary Bow, MD (Cardiology) Sherren Mocha, MD (Cardiology) Gatha Mayer, MD (Gastroenterology) Newton Pigg, MD (Obstetrics and Gynecology) Garald Balding, MD (Orthopedic Surgery) Marcial Pacas, MD as Consulting Physician (Neurology)  Indicate any recent Medical Services you may have received from other than [facility] providers in the past year (date may be approximate).     Assessment:   This is a routine wellness examination for [facility].  Hearing/Vision screen Hearing Screening - Comments:: Denies hearing difficulties   Vision Screening - Comments:: Wears rx glasses prn only - up to date with routine eye exams with [facility] in [place] issues and exercise activities discussed: Current Exercise Habits: Home  exercise routine, Type of exercise:  walking;stretching, Time (Minutes): 20, Frequency (Times/Week): 6, Weekly Exercise (Minutes/Week): 120, Intensity: Mild, Exercise limited by: psychological condition(s);neurologic condition(s);orthopedic condition(s)   Goals Addressed             This Visit's Progress    Exercise 3x per week (30 min per time)   On track    Patient Stated   On track    07/24/2022 AWV Goal: Fall Prevention  Over the next year, patient will decrease their risk for falls by: Using assistive devices, such as a cane or walker, as needed Identifying fall risks within their home and correcting them by: Removing throw rugs Adding handrails to stairs or ramps Removing clutter and keeping a clear pathway throughout the home Increasing light, especially at night Adding shower handles/bars Raising toilet seat Identifying potential personal risk factors for falls: Medication side effects Incontinence/urgency Vestibular dysfunction Hearing loss Musculoskeletal disorders Neurological disorders Orthostatic hypotension         Depression Screen    07/24/2022    2:10 PM 03/05/2022    2:35 PM 09/20/2021    4:09 PM 06/20/2021    2:54 PM 03/30/2021   10:33 AM 09/29/2020   11:00 AM 06/16/2020   10:36 AM  PHQ 2/9 Scores  PHQ - 2 Score 0 0 0 0 0 0 0    Fall Risk    07/24/2022    2:07 PM 03/05/2022    2:35 PM 09/20/2021    4:09 PM 06/20/2021    2:55 PM 03/30/2021   10:33 AM  Fall Risk   Falls in the past year? 0 0 0 0 0  Number falls in past yr: 0   0   Injury with Fall? 0   0   Risk for fall due to : Impaired balance/gait;Orthopedic patient   Impaired balance/gait;Orthopedic patient;Impaired vision   Follow up Falls prevention discussed;Education provided   Falls prevention discussed;Education provided     FALL RISK PREVENTION PERTAINING TO THE HOME:  Any stairs in or around the home? Yes  If so, are there any without handrails? No  Home free of loose throw rugs in  walkways, pet beds, electrical cords, etc? Yes  Adequate lighting in your home to reduce risk of falls? Yes   ASSISTIVE DEVICES UTILIZED TO PREVENT FALLS:  Life alert? No  Use of a cane, walker or w/c? Yes  Grab bars in the bathroom? Yes  Shower chair or bench in shower? Yes  Elevated toilet seat or a handicapped toilet? Yes   TIMED UP AND GO:  Was the test performed? No . Telephonic visit  Cognitive Function:    03/10/2020    3:07 PM 02/05/2019    9:21 AM 01/30/2018   10:35 AM 01/22/2018    9:42 AM 10/22/2017    7:59 AM  MMSE - Mini Mental State Exam  Orientation to time '5 4 5 5 5  '$ Orientation to Place '5 5 5 5 5  '$ Registration '3 3 3 3 3  '$ Attention/ Calculation '5 5 5 3 5  '$ Recall '1 3 3 3 2  '$ Language- name 2 objects '2 2 2 2 2  '$ Language- repeat '1 1 1 1 1  '$ Language- follow 3 step command '3 3 3 3 3  '$ Language- read & follow direction '1 1 1 1 1  '$ Write a sentence '1 1 1 1 1  '$ Copy design '1 1 1 1 1  '$ Total score '28 29 30 28 29        '$ 07/24/2022  2:13 PM 06/20/2021    2:57 PM 06/16/2020   10:41 AM 06/16/2019    1:44 PM  6CIT Screen  What Year? 0 points 0 points 0 points 0 points  What month? 0 points 0 points 3 points 0 points  What time? 0 points 0 points 0 points 0 points  Count back from 20 0 points 0 points 0 points 0 points  Months in reverse 2 points 0 points 0 points 0 points  Repeat phrase 8 points 0 points 2 points 0 points  Total Score 10 points 0 points 5 points 0 points    Immunizations Immunization History  Administered Date(s) Administered   Fluad Quad(high Dose 65+) 10/19/2019, 09/29/2020, 10/07/2021   Influenza Whole 11/16/2012   Influenza, High Dose Seasonal PF 09/15/2016, 09/04/2017, 09/24/2018   Influenza,inj,Quad PF,6+ Mos 09/15/2013, 10/06/2014, 11/01/2015   Moderna Sars-Covid-2 Vaccination 05/02/2020   Pneumococcal Conjugate-13 11/01/2015   Pneumococcal Polysaccharide-23 09/04/2017   Tdap 05/01/2017    TDAP status: Up to date  Flu Vaccine status: Up  to date  Pneumococcal vaccine status: Up to date  Covid-19 vaccine status: Declined, Education has been provided regarding the importance of this vaccine but patient still declined. Advised may receive this vaccine at local pharmacy or Health Dept.or vaccine clinic. Aware to provide a copy of the vaccination record if obtained from local pharmacy or Health Dept. Verbalized acceptance and understanding.  Qualifies for Shingles Vaccine? Yes   Zostavax completed No   Shingrix Completed?: No.    Education has been provided regarding the importance of this vaccine. Patient has been advised to call insurance company to determine out of pocket expense if they have not yet received this vaccine. Advised may also receive vaccine at local pharmacy or Health Dept. Verbalized acceptance and understanding.  Screening Tests Health Maintenance  Topic Date Due   Zoster Vaccines- Shingrix (1 of 2) Never done   COLON CANCER SCREENING ANNUAL FOBT  08/04/2020   INFLUENZA VACCINE  07/17/2022   Hepatitis C Screening  05/02/2023 (Originally 08/07/1969)   COLONOSCOPY (Pts 45-14yr Insurance coverage will need to be confirmed)  08/04/2022   MAMMOGRAM  01/20/2023   TETANUS/TDAP  05/02/2027   Pneumonia Vaccine 71 Years old  Completed   DEXA SCAN  Completed   HPV VACCINES  Aged Out   COVID-19 Vaccine  Discontinued    Health Maintenance  Health Maintenance Due  Topic Date Due   Zoster Vaccines- Shingrix (1 of 2) Never done   COLON CANCER SCREENING ANNUAL FOBT  08/04/2020   INFLUENZA VACCINE  07/17/2022    Colorectal cancer screening: Type of screening: Colonoscopy. Completed 08/05/2019. Repeat every 3 years patient says she will call and make appt soon  Mammogram status: Completed 01/20/2021. Repeat every year - made appt for this month  Bone Density status: Completed 03/28/2022. Results reflect: Bone density results: OSTEOPENIA. Repeat every 2 years.  Lung Cancer Screening: (Low Dose CT Chest recommended if  Age 72-80years, 30 pack-year currently smoking OR have quit w/in 15years.) does not qualify.   Additional Screening:  Hepatitis C Screening: does qualify; DUE  Vision Screening: Recommended annual ophthalmology exams for early detection of glaucoma and other disorders of the eye. Is the patient up to date with their annual eye exam?  Yes  Who is the provider or what is the name of the office in which the patient attends annual eye exams? [facility] If pt is not established with a provider, would they like to be referred to a  provider to establish care? No .   Dental Screening: Recommended annual dental exams for proper oral hygiene  Community Resource Referral / Chronic Care Management: CRR required this visit?  No   CCM required this visit?  No      Plan:     I have personally reviewed and noted the following in the patient's chart:   Medical and social history Use of alcohol, tobacco or illicit drugs  Current medications and supplements including opioid prescriptions.  Functional ability and status Nutritional status Physical activity Advanced directives List of other physicians Hospitalizations, surgeries, and ER visits in previous 12 months Vitals Screenings to include cognitive, depression, and falls Referrals and appointments  In addition, I have reviewed and discussed with patient certain preventive protocols, quality metrics, and best practice recommendations. A written personalized care plan for preventive services as well as general preventive health recommendations were provided to patient.     Sandrea Hammond, LPN   04/23/8324   Nurse Notes: None

## 2022-07-26 NOTE — Progress Notes (Signed)
I have reviewed and agree with the above AWV documentation.   Hendricks Limes, MSN, APRN, FNP-C [facility] 07/26/22

## 2022-07-31 ENCOUNTER — Other Ambulatory Visit: Payer: Self-pay | Admitting: Family Medicine

## 2022-07-31 DIAGNOSIS — Z1231 Encounter for screening mammogram for malignant neoplasm of breast: Secondary | ICD-10-CM

## 2022-08-10 ENCOUNTER — Ambulatory Visit
Admission: RE | Admit: 2022-08-10 | Discharge: 2022-08-10 | Disposition: A | Discharge status: Home / Self Care | Payer: Medicare PPO | Source: Ambulatory Visit | Attending: Family Medicine | Admitting: Family Medicine

## 2022-08-10 DIAGNOSIS — Z1231 Encounter for screening mammogram for malignant neoplasm of breast: Secondary | ICD-10-CM

## 2022-08-28 ENCOUNTER — Other Ambulatory Visit: Payer: Self-pay | Admitting: Family Medicine

## 2022-08-28 DIAGNOSIS — J301 Allergic rhinitis due to pollen: Secondary | ICD-10-CM

## 2022-09-17 ENCOUNTER — Ambulatory Visit: Payer: Medicare PPO | Admitting: Cardiovascular Disease

## 2022-09-18 ENCOUNTER — Ambulatory Visit: Payer: Medicare PPO | Attending: Cardiovascular Disease | Admitting: Cardiovascular Disease

## 2022-09-18 ENCOUNTER — Encounter: Payer: Self-pay | Admitting: Orthopaedic Surgery

## 2022-09-18 ENCOUNTER — Encounter: Payer: Self-pay | Admitting: Cardiovascular Disease

## 2022-09-18 ENCOUNTER — Ambulatory Visit (INDEPENDENT_AMBULATORY_CARE_PROVIDER_SITE_OTHER): Payer: Medicare PPO

## 2022-09-18 ENCOUNTER — Ambulatory Visit: Payer: Medicare PPO | Admitting: Orthopaedic Surgery

## 2022-09-18 VITALS — BP 156/80 | HR 65 | Ht 68.0 in | Wt 199.0 lb

## 2022-09-18 DIAGNOSIS — I2510 Atherosclerotic heart disease of native coronary artery without angina pectoris: Secondary | ICD-10-CM

## 2022-09-18 DIAGNOSIS — M5416 Radiculopathy, lumbar region: Secondary | ICD-10-CM | POA: Diagnosis not present

## 2022-09-18 DIAGNOSIS — M25552 Pain in left hip: Secondary | ICD-10-CM | POA: Diagnosis not present

## 2022-09-18 DIAGNOSIS — Q231 Congenital insufficiency of aortic valve: Secondary | ICD-10-CM

## 2022-09-18 DIAGNOSIS — M5442 Lumbago with sciatica, left side: Secondary | ICD-10-CM | POA: Diagnosis not present

## 2022-09-18 DIAGNOSIS — I10 Essential (primary) hypertension: Secondary | ICD-10-CM | POA: Diagnosis not present

## 2022-09-18 DIAGNOSIS — E782 Mixed hyperlipidemia: Secondary | ICD-10-CM

## 2022-09-18 DIAGNOSIS — G8929 Other chronic pain: Secondary | ICD-10-CM

## 2022-09-18 MED ORDER — AMLODIPINE BESYLATE 10 MG PO TABS: 10.0000 mg | ORAL_TABLET | Freq: Every day | ORAL | 3 refills | Status: DC

## 2022-09-18 NOTE — Progress Notes (Unsigned)
Cardiology Office Note:    Date:  09/18/2022   ID:  Shelly Bennett, DOB 09-Dec-1951, MRN [id number]  PCP:  Dettinger, Fransisca Kaufmann, MD   [place] Providers Cardiologist:  Sherren Mocha, MD     Referring MD: Dettinger, Fransisca Kaufmann, MD   Chief Complaint  Patient presents with   Annual Exam    History of Present Illness:    Shelly Bennett is a 71 y.o. female with a hx of nonobstructive coronary artery disease and bicuspid aortic valve, presenting for follow-up evaluation.  The patient is here alone today.  Her last echocardiogram 1 year ago was reviewed and shows normal LV function with mild aortic stenosis, mean transvalvular gradient 10 mmHg.  In the past year, she is continue to have some problems with gait instability and she ambulates with a cane.  She had lost a lot of weight and now has gained it back.  She used the [employer] and lost her weight quickly.  She states that she knows what think she needs to improve in her diet.  She denies any symptoms of chest pain, chest pressure, shortness of breath, leg swelling, orthopnea, PND, or heart palpitations.  She is compliant with her medications.  Past Medical History:  Diagnosis Date   Adenomatous colon polyp    Allergic drug reaction 06/25/2015   Allergy    Anxiety    Arthritis    At risk for falls    fallen 4 times recently    CAD (coronary artery disease)    Dr. Burt Knack follows    Carpal tunnel syndrome    Chronic headaches    Coccydynia 02/01/2012   Cystocele    Diverticulosis    External hemorrhoids    Focal nodular hyperplasia of liver    Gait abnormality    GERD (gastroesophageal reflux disease)    some   HCAP (healthcare-associated pneumonia) 06/25/2015   Heart murmur    HLD (hyperlipidemia)    HTN (hypertension)    IBS (irritable bowel syndrome)    Memory loss    Neuromuscular disorder ([place])    some neuropathy issues in the past- legs fibromyalgia in past-- past hx of steroid injections    Obesity    Osteoporosis    Otitis of left ear 02/01/2017   Pneumonia    Skin cancer    squamous cell - face    Past Surgical History:  Procedure Laterality Date   CARPAL TUNNEL RELEASE     COLONOSCOPY  12/15/2008   diverticulosis, external hemorrhoids   KNEE SURGERY  05/22/2015   right   LAPAROSCOPY     SKIN SURGERY     squamous cell - right face     Current Medications: Current Meds  Medication Sig   aspirin 81 MG tablet Take 81 mg by mouth daily.    buPROPion (WELLBUTRIN XL) 150 MG 24 hr tablet Take 1 tablet (150 mg total) by mouth daily.   Calcium Carbonate (CALCARB 600 PO) Take by mouth.   Cholecalciferol (VITAMIN D-3) 5000 UNITS TABS Take 1 capsule by mouth as directed. Take one capsule by mouth daily Mon thru Friday   donepezil (ARICEPT) 10 MG tablet Take 10 mg by mouth at bedtime. Per patient taking 1/2 tablet   fexofenadine (ALLEGRA) 180 MG tablet Take 180 mg by mouth daily.   fluticasone (FLONASE) 50 MCG/ACT nasal spray Place 2 sprays into both nostrils daily.   hydrochlorothiazide (HYDRODIURIL) 25 MG tablet TAKE (1/2) TABLET DAILY.   HYDROcodone-acetaminophen (  NORCO/VICODIN) 5-325 MG tablet Take 1 tablet by mouth every 6 (six) hours as needed for moderate pain.   memantine (NAMENDA) 10 MG tablet Take 1 tablet (10 mg total) by mouth 2 (two) times daily.   montelukast (SINGULAIR) 10 MG tablet TAKE ONE TABLET AT BEDTIME   omega-3 acid ethyl esters (LOVAZA) 1 g capsule Take 2 capsules (2 g total) by mouth 2 (two) times daily.   rosuvastatin (CRESTOR) 20 MG tablet Take 1 tablet (20 mg total) by mouth daily.   SF 5000 PLUS 1.1 % CREA dental cream Take by mouth as directed.   [DISCONTINUED] amLODipine (NORVASC) 10 MG tablet TAKE (1/2) TABLET DAILY.     Allergies:   Neomycin-polymyxin-dexameth, Codeine, Mold extract [trichophyton], Penicillins, Ace inhibitors, Angiotensin receptor blockers, Levaquin [levofloxacin], Vytorin [ezetimibe-simvastatin], and Zetia [ezetimibe]    Social History   Socioeconomic History   Marital status: Married    Spouse name: [name]   Number of children: 4   Years of education: 16   Highest education level: Bachelor's degree (e.g., BA, AB, BS)  Occupational History   Occupation: Pharmacist, hospital retired  Tobacco Use   Smoking status: Never   Smokeless tobacco: Never  Vaping Use   Vaping Use: Never used  Substance and Sexual Activity   Alcohol use: No   Drug use: No   Sexual activity: Not Currently    Birth control/protection: None  Other Topics Concern   Not on file  Social History Narrative   Lives at home with her husband.   Right-handed.   1 cup coffee each morning. 2-3 small soda cans each day.   Social Determinants of Health   Financial Resource Strain: Low Risk  (07/24/2022)   Overall Financial Resource Strain (CARDIA)    Difficulty of Paying Living Expenses: Not hard at all  Food Insecurity: No Food Insecurity (07/24/2022)   Hunger Vital Sign    Worried About Running Out of Food in the Last Year: Never true    Ran Out of Food in the Last Year: Never true  Transportation Needs: No Transportation Needs (07/24/2022)   PRAPARE - Hydrologist (Medical): No    Lack of Transportation (Non-Medical): No  Physical Activity: Insufficiently Active (07/24/2022)   Exercise Vital Sign    Days of Exercise per Week: 6 days    Minutes of Exercise per Session: 20 min  Stress: No Stress Concern Present (07/24/2022)   [place]    Feeling of Stress : Only a little  Social Connections: Socially Integrated (07/24/2022)   Social Connection and Isolation Panel [NHANES]    Frequency of Communication with Friends and Family: More than three times a week    Frequency of Social Gatherings with Friends and Family: More than three times a week    Attends Religious Services: More than 4 times per year    Active Member of [employer] or Organizations: Yes     Attends Music therapist: More than 4 times per year    Marital Status: Married     Family History: The patient's family history includes Alzheimer's disease in her father; Colon cancer (age of onset: 70) in her father; Dementia in her mother; Heart disease in her father and maternal grandfather; Heart disease (age of onset: 45) in her brother; Heart murmur in her mother; Hyperlipidemia in her mother and sister; Hypertension in her mother and sister; Kidney disease in her father and mother; Osteoporosis in her  mother; Prostate cancer in her father; Rectal cancer (age of onset: 1) in her maternal grandmother. There is no history of Colon polyps, Esophageal cancer, Stomach cancer, or Breast cancer.  ROS:   Please see the history of present illness.    All other systems reviewed and are negative.  EKGs/Labs/Other Studies Reviewed:    The following studies were reviewed today: Echo 08/28/2021: 1. Left ventricular ejection fraction, by estimation, is 60 to 65%. The  left ventricle has normal function. The left ventricle has no regional  wall motion abnormalities. Left ventricular diastolic parameters are  consistent with Grade I diastolic  dysfunction (impaired relaxation).   2. Right ventricular systolic function is normal. The right ventricular  size is normal. There is normal pulmonary artery systolic pressure. The  estimated right ventricular systolic pressure is 23.7 mmHg.   3. The mitral valve is normal in structure. Trivial mitral valve  regurgitation. No evidence of mitral stenosis.   4. The aortic valve is tricuspid. Aortic valve regurgitation is not  visualized. Mild aortic valve stenosis. Aortic valve area, by VTI measures  1.54 cm. Aortic valve mean gradient measures 10.0 mmHg.   5. The inferior vena cava is normal in size with greater than 50%  respiratory variability, suggesting right atrial pressure of 3 mmHg.   EKG:  EKG is ordered today.  The ekg ordered today  demonstrates normal sinus rhythm 65 bpm, left axis deviation, ST and T wave abnormality consider lateral ischemia.  No significant change from previous tracing 08/28/2021.  Recent Labs: 05/17/2022: ALT 15; BUN 12; Creatinine, Ser 0.78; Hemoglobin 13.8; Platelets 291; Potassium 4.4; Sodium 143  Recent Lipid Panel    Component Value Date/Time   CHOL 207 (H) 05/17/2022 1119   TRIG 93 05/17/2022 1119   TRIG 116 05/01/2017 0958   HDL 72 05/17/2022 1119   HDL 64 05/01/2017 0958   CHOLHDL 2.9 05/17/2022 1119   LDLCALC 119 (H) 05/17/2022 1119   LDLCALC 74 08/19/2014 0920     Risk Assessment/Calculations:      HYPERTENSION CONTROL Vitals:   09/18/22 1333 09/18/22 1413  BP: (!) 160/94 (!) 156/80    The patient's blood pressure is elevated above target today. {Click here if intervention needs to be changed Refresh Note :1}  In order to address the patient's elevated BP: A current anti-hypertensive medication was adjusted today.            Physical Exam:    VS:  BP (!) 156/80   Pulse 65   Ht '5\' 8"'$  (1.727 m)   Wt 199 lb (90.3 kg)   SpO2 98%   BMI 30.26 kg/m     Wt Readings from Last 3 Encounters:  09/18/22 199 lb (90.3 kg)  07/24/22 193 lb (87.5 kg)  05/17/22 193 lb (87.5 kg)     GEN:  Well nourished, well developed in no acute distress HEENT: Normal NECK: No JVD; No carotid bruits LYMPHATICS: No lymphadenopathy CARDIAC: RRR, 2/6 systolic murmur at the right upper sternal border RESPIRATORY:  Clear to auscultation without rales, wheezing or rhonchi  ABDOMEN: Soft, non-tender, non-distended MUSCULOSKELETAL:  No edema; No deformity  SKIN: Warm and dry NEUROLOGIC:  Alert and oriented x 3 PSYCHIATRIC:  Normal affect   ASSESSMENT:    1. Bicuspid aortic valve   2. Essential hypertension   3. Mixed hyperlipidemia   4. Coronary artery disease involving native coronary artery of native heart without angina pectoris    PLAN:    In order of  problems listed above:  The  patient's echocardiogram from 1 year ago is reviewed and demonstrates mild aortic stenosis with a mean transvalvular gradient of 10 mmHg.  Her exam is unchanged.  I would like her to return in 1 year for follow-up evaluation with a repeat echo at that time.  She has had no evidence of aortic root or ascending aortic dilatation. Blood pressure is suboptimally controlled.  The patient's weight has fluctuated a good bit over the past year.  She lost weight quickly on the [place] and has gained it back.  I suspect this is contributing to her hypertension.  My recheck today shows persistently elevated blood pressure.  I asked her to increase amlodipine to 10 mg daily, continue hydrochlorothiazide at current dose, and monitor her blood pressure 1 to 2 days/week.  Her daughter is a cardiac nurse and can keep an eye on her blood pressure. Treated with rosuvastatin 20 mg daily most recent lipids reviewed with a cholesterol of 207, HDL 72, LDL 119.  She has had some trouble with medication intolerance and we will continue her current dose. No anginal symptoms.  Noted to have mild nonobstructive CAD in the past.  Treated with aspirin, amlodipine, and a statin drug.      Medication Adjustments/Labs and Tests Ordered: Current medicines are reviewed at length with the patient today.  Concerns regarding medicines are outlined above.  No orders of the defined types were placed in this encounter.  No orders of the defined types were placed in this encounter.   Patient Instructions  Medication Instructions:  INCREASE Amlodipine to '10mg'$  daily *If you need a refill on your cardiac medications before your next appointment, please call your pharmacy*   Lab Work: NONE If you have labs (blood work) drawn today and your tests are completely normal, you will receive your results only by: [place] (if you have MyChart) OR A paper copy in the mail If you have any lab test that is abnormal or we need to  change your treatment, we will call you to review the results.   Testing/Procedures: ECHO Your physician has requested that you have an echocardiogram. Echocardiography is a painless test that uses sound waves to create images of your heart. It provides your doctor with information about the size and shape of your heart and how well your heart's chambers and valves are working. This procedure takes approximately one hour. There are no restrictions for this procedure.  Follow-Up: At [facility], you and your health needs are our priority.  As part of our continuing mission to provide you with exceptional heart care, we have created designated Provider Care Teams.  These Care Teams include your primary Cardiologist (physician) and Advanced Practice Providers (APPs -  Physician Assistants and Nurse Practitioners) who all work together to provide you with the care you need, when you need it.  Your next appointment:   1 year(s)  The format for your next appointment:   In Person  Provider:   Sherren Mocha, MD  or APP     Important Information About Sugar         Signed, Sherren Mocha, MD  09/18/2022 2:14 PM    [place]

## 2022-09-18 NOTE — Patient Instructions (Signed)
Medication Instructions:  INCREASE Amlodipine to '10mg'$  daily *If you need a refill on your cardiac medications before your next appointment, please call your pharmacy*   Lab Work: NONE If you have labs (blood work) drawn today and your tests are completely normal, you will receive your results only by: [place] (if you have MyChart) OR A paper copy in the mail If you have any lab test that is abnormal or we need to change your treatment, we will call you to review the results.   Testing/Procedures: ECHO Your physician has requested that you have an echocardiogram. Echocardiography is a painless test that uses sound waves to create images of your heart. It provides your doctor with information about the size and shape of your heart and how well your heart's chambers and valves are working. This procedure takes approximately one hour. There are no restrictions for this procedure.  Follow-Up: At [facility], you and your health needs are our priority.  As part of our continuing mission to provide you with exceptional heart care, we have created designated Provider Care Teams.  These Care Teams include your primary Cardiologist (physician) and Advanced Practice Providers (APPs -  Physician Assistants and Nurse Practitioners) who all work together to provide you with the care you need, when you need it.  Your next appointment:   1 year(s)  The format for your next appointment:   In Person  Provider:   Sherren Mocha, MD  or APP     Important Information About Sugar

## 2022-09-18 NOTE — Progress Notes (Signed)
Office Visit Note   Patient: Shelly Bennett           Date of Birth: [birth year]           MRN: [id number] Visit Date: 09/18/2022              Requested by: Bennett, Shelly Kaufmann, MD [place],  [place] [id number] PCP: Bennett, Shelly Kaufmann, MD   Assessment & Plan: Visit Diagnoses:  1. Pain of left hip   2. Chronic left-sided low back pain with left-sided sciatica   3. Left lumbar radiculopathy     Plan: Shelly Bennett has been experiencing some pain in her back left buttock left leg as far distally as her toes for several months with a recent exacerbation.  No injury or trauma.  She does use a cane for chronic problem with balance.  She also has an issue with her memory and is on Aricept per her neurologist.  She has not had any numbness or tingling.  Films of her lumbar spine reveal some degenerative changes but not much change from 2019.  She did have an area of ectopic calcification along the femoral neck medially left hip.  Will order an MRI scan of that area.  If nondiagnostic then would consider an epidural steroid injection for her lumbar spine that appears to be the origin of her pain  Follow-Up Instructions: Return After MRI scan of the pelvis.   Orders:  Orders Placed This Encounter  Procedures   XR Lumbar Spine 2-3 Views   XR Pelvis 1-2 Views   MR Pelvis w/o contrast   No orders of the defined types were placed in this encounter.     Procedures: No procedures performed   Clinical Data: No additional findings.   Subjective: Chief Complaint  Patient presents with   Left Leg - Pain  Several month history of low back left buttock and left leg pain as far distally as her foot.  Has a history of low back pain many years ago with an MRI scan demonstrating some degenerative changes.  She thinks she may have had a cortisone injection years ago that may have given her some relief.  She does have a history of some memory changes and is on Aricept.  Also taking Namenda  her neurologist  HPI  Review of Systems   Objective: Vital Signs: There were no vitals taken for this visit.  Physical Exam Constitutional:      Appearance: She is well-developed.  Pulmonary:     Effort: Pulmonary effort is normal.  Skin:    General: Skin is warm and dry.  Neurological:     Mental Status: She is alert and oriented to person, place, and time.  Psychiatric:        Behavior: Behavior normal.     Ortho Exam awake alert and oriented x3.  Comfortable sitting.  Does have use of a cane because of her chronic history of balance issues.  Some stiffness with internal/external rotation of both of her hips but really no pain.  She did have some discomfort in the area of her left buttock but not along the lateral aspect of her hip.  Straight leg raise is negative.  Did have some percussible tenderness of her lumbar spine  Specialty Comments:  No specialty comments available.  Imaging: XR Lumbar Spine 2-3 Views  Result Date: 09/18/2022 Films of the spine were obtained in several projections.  There was no evidence of a scoliosis.  There are some degenerative changes at the disc base at L5-S1 with narrowing and a little mild rotation of the Shelly Bennett aspect of L2.  These do not appear to be significantly changed from films in 2019.  There is slightly more calcification within the abdominal aorta  XR Pelvis 1-2 Views  Result Date: 09/18/2022 AP the pelvis demonstrated an area of fluffy ectopic calcification along the medial aspect of the proximal femur in the area of the femoral neck.  Not sure this is symptomatic but we will order an MRI scan minimal degenerative changes of both hips    PMFS History: Patient Active Problem List   Diagnosis Date Noted   Gait abnormality 03/10/2020   Gastroesophageal reflux disease 10/19/2019   Third degree uterine prolapse 10/19/2019   Spinal stenosis of lumbar region without neurogenic claudication 09/15/2019   Urinary and fecal  incontinence 09/15/2019   Cystocele with rectocele 09/15/2019   Left lumbar radiculopathy 01/22/2018   Mild cognitive impairment 10/22/2017   Anxiety 10/22/2017   Aortic atherosclerosis ([place]) 12/04/2016   Vitamin D deficiency 08/19/2014   Osteopenia 08/12/2013   CAD (coronary artery disease), native coronary artery 03/22/2013   Aortic valve stenosis, mild 03/22/2013   Hyperlipemia 03/11/2013   Hypertension 03/11/2013   Allergic rhinitis 03/11/2013   IBS (irritable bowel syndrome) - with chronic recurrent abdominal pain 02/01/2012   Personal history of colonic polyps - adenoma 02/01/2012   Past Medical History:  Diagnosis Date   Adenomatous colon polyp    Allergic drug reaction 06/25/2015   Allergy    Anxiety    Arthritis    At risk for falls    fallen 4 times recently    CAD (coronary artery disease)    Dr. Burt Knack follows    Carpal tunnel syndrome    Chronic headaches    Coccydynia 02/01/2012   Cystocele    Diverticulosis    External hemorrhoids    Focal nodular hyperplasia of liver    Gait abnormality    GERD (gastroesophageal reflux disease)    some   HCAP (healthcare-associated pneumonia) 06/25/2015   Heart murmur    HLD (hyperlipidemia)    HTN (hypertension)    IBS (irritable bowel syndrome)    Memory loss    Neuromuscular disorder ([facility])    some neuropathy issues in the past- legs fibromyalgia in past-- past hx of steroid injections   Obesity    Osteoporosis    Otitis of left ear 02/01/2017   Pneumonia    Skin cancer    squamous cell - face    Family History  Problem Relation Age of Onset   Hyperlipidemia Mother    Hypertension Mother    Kidney disease Mother    Osteoporosis Mother    Heart murmur Mother    Dementia Mother    Prostate cancer Father    Colon cancer Father 82   Kidney disease Father    Heart disease Father    Alzheimer's disease Father    Hyperlipidemia Sister    Hypertension Sister    Rectal cancer Maternal Grandmother 29   Heart  disease Maternal Grandfather    Heart disease Brother 64       not dx questionable    Colon polyps Neg Hx    Esophageal cancer Neg Hx    Stomach cancer Neg Hx    Breast cancer Neg Hx     Past Surgical History:  Procedure Laterality Date   CARPAL TUNNEL RELEASE     COLONOSCOPY  12/15/2008   diverticulosis, external hemorrhoids   KNEE SURGERY  05/22/2015   right   LAPAROSCOPY     SKIN SURGERY     squamous cell - right face    Social History   Occupational History   Occupation: Pharmacist, hospital retired  Tobacco Use   Smoking status: Never   Smokeless tobacco: Never  Vaping Use   Vaping Use: Never used  Substance and Sexual Activity   Alcohol use: No   Drug use: No   Sexual activity: Not Currently    Birth control/protection: None     Garald Balding, MD   Note - This record has been created using [employer].  Chart creation errors have been sought, but may not always  have been located. Such creation errors do not reflect on  the standard of medical care. 0

## 2022-09-20 DIAGNOSIS — Z872 Personal history of diseases of the skin and subcutaneous tissue: Secondary | ICD-10-CM | POA: Diagnosis not present

## 2022-09-20 DIAGNOSIS — D225 Melanocytic nevi of trunk: Secondary | ICD-10-CM | POA: Diagnosis not present

## 2022-09-20 DIAGNOSIS — Z85828 Personal history of other malignant neoplasm of skin: Secondary | ICD-10-CM | POA: Diagnosis not present

## 2022-09-20 DIAGNOSIS — Z08 Encounter for follow-up examination after completed treatment for malignant neoplasm: Secondary | ICD-10-CM | POA: Diagnosis not present

## 2022-09-20 DIAGNOSIS — L821 Other seborrheic keratosis: Secondary | ICD-10-CM | POA: Diagnosis not present

## 2022-09-20 DIAGNOSIS — Z09 Encounter for follow-up examination after completed treatment for conditions other than malignant neoplasm: Secondary | ICD-10-CM | POA: Diagnosis not present

## 2022-09-20 DIAGNOSIS — L814 Other melanin hyperpigmentation: Secondary | ICD-10-CM | POA: Diagnosis not present

## 2022-09-20 DIAGNOSIS — Z86007 Personal history of in-situ neoplasm of skin: Secondary | ICD-10-CM | POA: Diagnosis not present

## 2022-09-21 ENCOUNTER — Ambulatory Visit (HOSPITAL_COMMUNITY)
Admission: RE | Admit: 2022-09-21 | Discharge: 2022-09-21 | Disposition: A | Discharge status: Home / Self Care | Payer: Medicare PPO | Source: Ambulatory Visit | Attending: Orthopaedic Surgery | Admitting: Orthopaedic Surgery

## 2022-09-21 DIAGNOSIS — M160 Bilateral primary osteoarthritis of hip: Secondary | ICD-10-CM | POA: Diagnosis not present

## 2022-09-21 DIAGNOSIS — K5730 Diverticulosis of large intestine without perforation or abscess without bleeding: Secondary | ICD-10-CM | POA: Diagnosis not present

## 2022-09-21 DIAGNOSIS — M25552 Pain in left hip: Secondary | ICD-10-CM | POA: Diagnosis not present

## 2022-09-21 DIAGNOSIS — S76312A Strain of muscle, fascia and tendon of the posterior muscle group at thigh level, left thigh, initial encounter: Secondary | ICD-10-CM | POA: Diagnosis not present

## 2022-10-04 ENCOUNTER — Ambulatory Visit: Payer: Medicare PPO | Admitting: Orthopaedic Surgery

## 2022-10-04 ENCOUNTER — Encounter: Payer: Self-pay | Admitting: Orthopaedic Surgery

## 2022-10-04 ENCOUNTER — Ambulatory Visit: Payer: Self-pay

## 2022-10-04 DIAGNOSIS — M5416 Radiculopathy, lumbar region: Secondary | ICD-10-CM | POA: Diagnosis not present

## 2022-10-04 DIAGNOSIS — M170 Bilateral primary osteoarthritis of knee: Secondary | ICD-10-CM

## 2022-10-04 DIAGNOSIS — M25562 Pain in left knee: Secondary | ICD-10-CM | POA: Diagnosis not present

## 2022-10-04 DIAGNOSIS — G8929 Other chronic pain: Secondary | ICD-10-CM

## 2022-10-04 MED ORDER — LIDOCAINE HCL 1 % IJ SOLN
2.0000 mL | INTRAMUSCULAR | Status: AC | PRN
  Administered 2022-10-04: 2 mL

## 2022-10-04 MED ORDER — BUPIVACAINE HCL 0.25 % IJ SOLN
2.0000 mL | INTRAMUSCULAR | Status: AC | PRN
  Administered 2022-10-04: 2 mL via INTRA_ARTICULAR

## 2022-10-04 MED ORDER — METHYLPREDNISOLONE ACETATE 80 MG/ML IJ SUSP
80.0000 mg | INTRAMUSCULAR | Status: AC | PRN
  Administered 2022-10-04: 80 mg via INTRA_ARTICULAR

## 2022-10-04 MED ORDER — METHYLPREDNISOLONE ACETATE 40 MG/ML IJ SUSP
80.0000 mg | INTRAMUSCULAR | Status: AC | PRN
  Administered 2022-10-04: 80 mg via INTRA_ARTICULAR

## 2022-10-04 NOTE — Progress Notes (Signed)
Office Visit Note   Patient: Shelly Bennett           Date of Birth: 06/20/1951           MRN: [id number] Visit Date: 10/04/2022              Requested by: Dettinger, Fransisca Kaufmann, MD [place],  [place] [address] PCP: Dettinger, Fransisca Kaufmann, MD   Assessment & Plan: Visit Diagnoses:  1. Chronic pain of left knee   2. Bilateral primary osteoarthritis of knee   3. Left lumbar radiculopathy     Plan: Shelly Bennett was seen recently for evaluation of left buttock, left lateral hip, left thigh knee and calf pain.  I obtained an AP of her pelvis with some abnormal calcification medial to the femoral neck and lesser trochanter.  I ordered an MRI scan.  The scan demonstrates a mild degenerative changes of both hips and even the pubic symphysis as well as the SI joints.  The area of calcification appeared to be related to the hamstring insertion and was chronic.  Apparently this is not causing her any pain.  She is having more trouble now in the area of her left knee.  Films demonstrated tricompartmental degenerative changes.  Based on her present pain I believe the majority of her discomfort is related to her left knee.  Her knee was not hot red warm or swollen and no effusion.  I will inject their left knee with Depo-Medrol, Marcaine and Xylocaine and monitor her response over the next several weeks.  If this eliminates her problem then we know of the issue is related to the knee if it eliminates the knee pain but still having some buttock and back pain I would suggest an epidural steroid injection.  She had an MRI scan of the lumbar spine in 2019 demonstrating significant degenerative changes and severe stenosis at L3-4  Follow-Up Instructions: Return in about 2 weeks (around 10/18/2022).   Orders:  Orders Placed This Encounter  Procedures   XR KNEE 3 VIEW LEFT   No orders of the defined types were placed in this encounter.     Procedures: Large Joint Inj: L knee on 10/04/2022 3:01  PM Indications: pain and diagnostic evaluation Details: 25 G 1.5 in needle, anteromedial approach  Arthrogram: No  Medications: 2 mL lidocaine 1 %; 80 mg methylPREDNISolone acetate 40 MG/ML; 2 mL bupivacaine 0.25 %; 80 mg methylPREDNISolone acetate 80 MG/ML Outcome: tolerated well, no immediate complications  Left knee injected along the medial compartment without any problems after alcohol Betadine prep Procedure, treatment alternatives, risks and benefits explained, specific risks discussed. Consent was given by the patient. Patient was prepped and draped in the usual sterile fashion.       Clinical Data: No additional findings.   Subjective: Chief Complaint  Patient presents with   Left Hip - Follow-up    MRI review  Patient presents today for follow up on her left hip. She had an MRI and is here today for those results.  Having more pain about the left knee some difficulty with bending stooping squatting and inclines as well as some medial joint pain when she is up and about.  No history of injury or trauma.  A little less pain in her buttock lateral hip and thigh on the left does use a cane for her balance as previously mentioned and is somewhat "forgetful" and is on Aricept HPI  Review of Systems   Objective:  Vital Signs: There were no vitals taken for this visit.  Physical Exam Constitutional:      Appearance: She is well-developed.  Eyes:     Pupils: Pupils are equal, round, and reactive to light.  Pulmonary:     Effort: Pulmonary effort is normal.  Skin:    General: Skin is warm and dry.  Neurological:     Mental Status: She is alert and oriented to person, place, and time.  Psychiatric:        Behavior: Behavior normal.     Ortho Exam awake alert and oriented x3.  Comfortable sitting.  No acute distress.  Straight leg raise negative.  Painless range of motion left hip.  No pain about her left thigh or the lateral aspect of her hip and no specific buttock  pain.  Little bit of discomfort in the lumbar spine and may be in the area of the left SI joint where there is a little bit of arthritis by prior MRI scan and plain film.  Most of her discomfort was localized to the left knee.  The knee was not hot red warm or swollen and not effused.  There was positive crepitation and some pain with patella compression and some medial joint pain but no instability full extension and flexion at least to 100 degrees  Specialty Comments:  No specialty comments available.  Imaging: No results found.   PMFS History: Patient Active Problem List   Diagnosis Date Noted   Bilateral primary osteoarthritis of knee 10/04/2022   Gait abnormality 03/10/2020   Gastroesophageal reflux disease 10/19/2019   Third degree uterine prolapse 10/19/2019   Spinal stenosis of lumbar region without neurogenic claudication 09/15/2019   Urinary and fecal incontinence 09/15/2019   Cystocele with rectocele 09/15/2019   Left lumbar radiculopathy 01/22/2018   Mild cognitive impairment 10/22/2017   Anxiety 10/22/2017   Aortic atherosclerosis ([place]) 12/04/2016   Vitamin D deficiency 08/19/2014   Osteopenia 08/12/2013   CAD (coronary artery disease), native coronary artery 03/22/2013   Aortic valve stenosis, mild 03/22/2013   Hyperlipemia 03/11/2013   Hypertension 03/11/2013   Allergic rhinitis 03/11/2013   IBS (irritable bowel syndrome) - with chronic recurrent abdominal pain 02/01/2012   Personal history of colonic polyps - adenoma 02/01/2012   Past Medical History:  Diagnosis Date   Adenomatous colon polyp    Allergic drug reaction 06/25/2015   Allergy    Anxiety    Arthritis    At risk for falls    fallen 4 times recently    CAD (coronary artery disease)    Dr. Burt Knack follows    Carpal tunnel syndrome    Chronic headaches    Coccydynia 02/01/2012   Cystocele    Diverticulosis    External hemorrhoids    Focal nodular hyperplasia of liver    Gait abnormality    GERD  (gastroesophageal reflux disease)    some   HCAP (healthcare-associated pneumonia) 06/25/2015   Heart murmur    HLD (hyperlipidemia)    HTN (hypertension)    IBS (irritable bowel syndrome)    Memory loss    Neuromuscular disorder (HCC)    some neuropathy issues in the past- legs fibromyalgia in past-- past hx of steroid injections   Obesity    Osteoporosis    Otitis of left ear 02/01/2017   Pneumonia    Skin cancer    squamous cell - face    Family History  Problem Relation Age of Onset   Hyperlipidemia Mother  Hypertension Mother    Kidney disease Mother    Osteoporosis Mother    Heart murmur Mother    Dementia Mother    Prostate cancer Father    Colon cancer Father 33   Kidney disease Father    Heart disease Father    Alzheimer's disease Father    Hyperlipidemia Sister    Hypertension Sister    Rectal cancer Maternal Grandmother 66   Heart disease Maternal Grandfather    Heart disease Brother 15       not dx questionable    Colon polyps Neg Hx    Esophageal cancer Neg Hx    Stomach cancer Neg Hx    Breast cancer Neg Hx     Past Surgical History:  Procedure Laterality Date   CARPAL TUNNEL RELEASE     COLONOSCOPY  12/15/2008   diverticulosis, external hemorrhoids   KNEE SURGERY  05/22/2015   right   LAPAROSCOPY     SKIN SURGERY     squamous cell - right face    Social History   Occupational History   Occupation: Pharmacist, hospital retired  Tobacco Use   Smoking status: Never   Smokeless tobacco: Never  Vaping Use   Vaping Use: Never used  Substance and Sexual Activity   Alcohol use: No   Drug use: No   Sexual activity: Not Currently    Birth control/protection: None

## 2022-10-05 ENCOUNTER — Encounter: Payer: Self-pay | Admitting: Internal Medicine

## 2022-10-23 ENCOUNTER — Encounter: Payer: Self-pay | Admitting: Orthopaedic Surgery

## 2022-10-23 ENCOUNTER — Ambulatory Visit: Payer: Medicare PPO | Admitting: Orthopaedic Surgery

## 2022-10-23 DIAGNOSIS — M25562 Pain in left knee: Secondary | ICD-10-CM | POA: Diagnosis not present

## 2022-10-23 DIAGNOSIS — M5416 Radiculopathy, lumbar region: Secondary | ICD-10-CM

## 2022-10-23 NOTE — Progress Notes (Signed)
Office Visit Note   Patient: Shelly Bennett           Date of Birth: 1951/01/23           MRN: [id number] Visit Date: 10/23/2022              Requested by: Dettinger, Fransisca Kaufmann, MD [address],  [place] [address] PCP: Dettinger, Fransisca Kaufmann, MD   Assessment & Plan: Visit Diagnoses:  1. Left lumbar radiculopathy   2. Left knee pain, unspecified chronicity     Plan: Ms. Schindel presents 2 weeks follow-up after receiving a steroid injection into her left knee.  She has had a history of left-sided posterior buttock hip and knee pain.  She also complained of some left forefoot pain but does have a history of 10 years ago breaking her toes.  She has had significant relief in both her knee and her back symptoms from the knee injection.  It may be that her antalgic gait was affecting her back.  She may follow-up as needed for her knee.  She could get another steroid injection into her knee if the pain or turned.  If she had good but short-lived results she could consider viscosupplementation.  With regards to her foot she does have some clinical deformity of the toes but she has brisk capillary refill and no swelling or redness.  She is not diabetic does not have any neuropathy or change in sensation.  She does have some tightness in her Achilles.  Encouraged her to wear comfortable supportive shoes.  Had an injury to her left foot maybe 10 years ago and thinks that is when all of her problems started  Follow-Up Instructions: Return if symptoms worsen or fail to improve.   Orders:  No orders of the defined types were placed in this encounter.  No orders of the defined types were placed in this encounter.     Procedures: No procedures performed   Clinical Data: No additional findings.   Subjective: Chief Complaint  Patient presents with   Right Knee - Follow-up   Left Knee - Follow-up   Lower Back - Follow-up    HPI Ms. Wolfson comes in today to follow-up on the results  of her left knee injection she received 2 weeks ago.  She does think it was quite helpful with her left knee and is also relieved some of her back symptoms.  Has had some ongoing problems with the left foot dating back to an injury about 10 years ago but does wear good comfortable shoes and appears to be unrelated to her present problems Review of Systems  All other systems reviewed and are negative.    Objective: Vital Signs: There were no vitals taken for this visit.  Physical Exam Constitutional:      Appearance: Normal appearance.  Pulmonary:     Effort: Pulmonary effort is normal.  Skin:    General: Skin is warm and dry.  Neurological:     Mental Status: She is alert.     Ortho Exam Knee she has no swelling no erythema no effusion no warmth.  She does have some crepitus with range of motion.  Decreased symptoms over the patellofemoral joint.  Examination of her left foot she has palpable dorsalis pedis pulse.  Her foot is warm without any redness and brisk capillary refill in all of her toes.  She does have some clawing deformities of the toes and deviation of the lesser toes.  She has an Achilles contracture coming to just short of neutral.  Sensation is intact Specialty Comments:  No specialty comments available.  Imaging: No results found.   PMFS History: Patient Active Problem List   Diagnosis Date Noted   Pain in left knee 10/23/2022   Bilateral primary osteoarthritis of knee 10/04/2022   Gait abnormality 03/10/2020   Gastroesophageal reflux disease 10/19/2019   Third degree uterine prolapse 10/19/2019   Spinal stenosis of lumbar region without neurogenic claudication 09/15/2019   Urinary and fecal incontinence 09/15/2019   Cystocele with rectocele 09/15/2019   Left lumbar radiculopathy 01/22/2018   Mild cognitive impairment 10/22/2017   Anxiety 10/22/2017   Aortic atherosclerosis ([place]) 12/04/2016   Vitamin D deficiency 08/19/2014   Osteopenia 08/12/2013   CAD  (coronary artery disease), native coronary artery 03/22/2013   Aortic valve stenosis, mild 03/22/2013   Hyperlipemia 03/11/2013   Hypertension 03/11/2013   Allergic rhinitis 03/11/2013   IBS (irritable bowel syndrome) - with chronic recurrent abdominal pain 02/01/2012   Personal history of colonic polyps - adenoma 02/01/2012   Past Medical History:  Diagnosis Date   Adenomatous colon polyp    Allergic drug reaction 06/25/2015   Allergy    Anxiety    Arthritis    At risk for falls    fallen 4 times recently    CAD (coronary artery disease)    Dr. Burt Knack follows    Carpal tunnel syndrome    Chronic headaches    Coccydynia 02/01/2012   Cystocele    Diverticulosis    External hemorrhoids    Focal nodular hyperplasia of liver    Gait abnormality    GERD (gastroesophageal reflux disease)    some   HCAP (healthcare-associated pneumonia) 06/25/2015   Heart murmur    HLD (hyperlipidemia)    HTN (hypertension)    IBS (irritable bowel syndrome)    Memory loss    Neuromuscular disorder (HCC)    some neuropathy issues in the past- legs fibromyalgia in past-- past hx of steroid injections   Obesity    Osteoporosis    Otitis of left ear 02/01/2017   Pneumonia    Skin cancer    squamous cell - face    Family History  Problem Relation Age of Onset   Hyperlipidemia Mother    Hypertension Mother    Kidney disease Mother    Osteoporosis Mother    Heart murmur Mother    Dementia Mother    Prostate cancer Father    Colon cancer Father 90   Kidney disease Father    Heart disease Father    Alzheimer's disease Father    Hyperlipidemia Sister    Hypertension Sister    Rectal cancer Maternal Grandmother 63   Heart disease Maternal Grandfather    Heart disease Brother 68       not dx questionable    Colon polyps Neg Hx    Esophageal cancer Neg Hx    Stomach cancer Neg Hx    Breast cancer Neg Hx     Past Surgical History:  Procedure Laterality Date   CARPAL TUNNEL RELEASE      COLONOSCOPY  12/15/2008   diverticulosis, external hemorrhoids   KNEE SURGERY  05/22/2015   right   LAPAROSCOPY     SKIN SURGERY     squamous cell - right face    Social History   Occupational History   Occupation: Pharmacist, hospital retired  Tobacco Use   Smoking status: Never   Smokeless  tobacco: Never  Vaping Use   Vaping Use: Never used  Substance and Sexual Activity   Alcohol use: No   Drug use: No   Sexual activity: Not Currently    Birth control/protection: None

## 2022-11-16 ENCOUNTER — Ambulatory Visit: Payer: Medicare PPO | Admitting: Family Medicine

## 2022-11-29 ENCOUNTER — Ambulatory Visit: Payer: Medicare PPO | Admitting: Family Medicine

## 2022-11-29 ENCOUNTER — Ambulatory Visit (INDEPENDENT_AMBULATORY_CARE_PROVIDER_SITE_OTHER): Payer: Medicare PPO

## 2022-11-29 ENCOUNTER — Ambulatory Visit (HOSPITAL_COMMUNITY)
Admission: RE | Admit: 2022-11-29 | Discharge: 2022-11-29 | Disposition: A | Discharge status: Home / Self Care | Payer: Medicare PPO | Source: Ambulatory Visit | Attending: Family Medicine | Admitting: Family Medicine

## 2022-11-29 ENCOUNTER — Encounter: Payer: Self-pay | Admitting: Family Medicine

## 2022-11-29 VITALS — BP 152/75 | HR 65 | Temp 97.8°F | Ht 68.0 in | Wt 189.0 lb

## 2022-11-29 DIAGNOSIS — R531 Weakness: Secondary | ICD-10-CM | POA: Diagnosis not present

## 2022-11-29 DIAGNOSIS — R410 Disorientation, unspecified: Secondary | ICD-10-CM | POA: Diagnosis not present

## 2022-11-29 DIAGNOSIS — J40 Bronchitis, not specified as acute or chronic: Secondary | ICD-10-CM | POA: Diagnosis not present

## 2022-11-29 DIAGNOSIS — G3184 Mild cognitive impairment, so stated: Secondary | ICD-10-CM

## 2022-11-29 DIAGNOSIS — R413 Other amnesia: Secondary | ICD-10-CM | POA: Diagnosis not present

## 2022-11-29 DIAGNOSIS — R0602 Shortness of breath: Secondary | ICD-10-CM

## 2022-11-29 DIAGNOSIS — R059 Cough, unspecified: Secondary | ICD-10-CM | POA: Diagnosis not present

## 2022-11-29 DIAGNOSIS — G319 Degenerative disease of nervous system, unspecified: Secondary | ICD-10-CM | POA: Diagnosis not present

## 2022-11-29 LAB — URINALYSIS, COMPLETE
Bilirubin, UA: NEGATIVE
Glucose, UA: NEGATIVE
Ketones, UA: NEGATIVE
Nitrite, UA: NEGATIVE
Protein,UA: NEGATIVE
Specific Gravity, UA: 1.01 (ref 1.005–1.030)
Urobilinogen, Ur: 0.2 mg/dL (ref 0.2–1.0)
pH, UA: 6.5 (ref 5.0–7.5)

## 2022-11-29 MED ORDER — DOXYCYCLINE HYCLATE 100 MG PO TABS: 100.0000 mg | ORAL_TABLET | Freq: Two times a day (BID) | ORAL | 0 refills | Status: DC

## 2022-11-29 NOTE — Progress Notes (Signed)
BP (!) 152/75   Pulse 65   Temp 97.8 F (36.6 C)   Ht _0  (1.727 m)   Wt 189 lb (85.7 kg)   SpO2 98%   BMI 28.74 kg/m    Subjective:   Patient ID: Shelly Bennett, female    DOB: [birth year], 71 y.o.   MRN: [id number]  HPI: Shelly Bennett is a 71 y.o. female presenting on 11/29/2022 for memory changes, Cough, and Ear Pain (right)   HPI Patient is being brought in by family with some issues that been going on over the past 4 to 5 days.  She has had some memory changes and weakness and shortness of breath and increased cough and congestion.  They said that 4 days ago overnight she went to the couch to sleep and then got up but could not get up and then crawled to the kitchen and was incontinent of urine all over the kitchen.  They also said over the past 4 to 5 days she has had incontinence of bowel and bladder as well.  They said the past 24 hours her memory has started to come back but she still weak and having some bowel and bladder issues that she does not normally have.  They deny any focal numbness or weakness.  We did an MMSE today and she scored 27 out of 30.  She also complains of some sinus congestion and right ear pain that is been going on as well.  Relevant past medical, surgical, family and social history reviewed and updated as indicated. Interim medical history since our last visit reviewed. Allergies and medications reviewed and updated.  Review of Systems  Constitutional:  Negative for chills and fever.  HENT:  Positive for congestion, ear pain, postnasal drip, sinus pressure and sinus pain. Negative for ear discharge, sneezing and sore throat.   Eyes:  Negative for redness and visual disturbance.  Respiratory:  Positive for cough. Negative for chest tightness and shortness of breath.   Cardiovascular:  Negative for chest pain and leg swelling.  Gastrointestinal:  Positive for diarrhea. Negative for abdominal pain, constipation and nausea.   Genitourinary:  Positive for frequency. Negative for dysuria.  Musculoskeletal:  Negative for back pain and gait problem.  Skin:  Negative for rash.  Neurological:  Negative for light-headedness and headaches.  Psychiatric/Behavioral:  Negative for agitation and behavioral problems.   All other systems reviewed and are negative.   Per HPI unless specifically indicated above   Allergies as of 11/29/2022       Reactions   Neomycin-polymyxin-dexameth    Other reaction(s): eye redness   Codeine    ? Reaction ER visit.    Mold Extract [trichophyton]    Migraine   Penicillins Other (See Comments)   "drew my legs up as child" Pt has tolerated cephalexin in the past.   Ace Inhibitors Cough   Angiotensin Receptor Blockers Cough   Levaquin [levofloxacin] Rash   Per notes from outpatient provider   Vytorin [ezetimibe-simvastatin] Other (See Comments)   cramping   Zetia [ezetimibe] Other (See Comments)   cramping        Medication List        Accurate as of November 29, 2022 12:42 PM. If you have any questions, ask your nurse or doctor.          amLODipine 10 MG tablet Commonly known as: NORVASC Take 1 tablet (10 mg total) by mouth daily.   aspirin 81 MG tablet Take  81 mg by mouth daily.   buPROPion 150 MG 24 hr tablet Commonly known as: Wellbutrin XL Take 1 tablet (150 mg total) by mouth daily.   CALCARB 600 PO Take by mouth.   donepezil 10 MG tablet Commonly known as: ARICEPT Take 10 mg by mouth at bedtime. Per patient taking 1/2 tablet   fexofenadine 180 MG tablet Commonly known as: ALLEGRA Take 180 mg by mouth daily.   fluticasone 50 MCG/ACT nasal spray Commonly known as: FLONASE Place 2 sprays into both nostrils daily.   hydrochlorothiazide 25 MG tablet Commonly known as: HYDRODIURIL TAKE (1/2) TABLET DAILY.   HYDROcodone-acetaminophen 5-325 MG tablet Commonly known as: NORCO/VICODIN Take 1 tablet by mouth every 6 (six) hours as needed for  moderate pain.   memantine 10 MG tablet Commonly known as: NAMENDA Take 1 tablet (10 mg total) by mouth 2 (two) times daily.   montelukast 10 MG tablet Commonly known as: SINGULAIR TAKE ONE TABLET AT BEDTIME   omega-3 acid ethyl esters 1 g capsule Commonly known as: LOVAZA Take 2 capsules (2 g total) by mouth 2 (two) times daily.   rosuvastatin 20 MG tablet Commonly known as: CRESTOR Take 1 tablet (20 mg total) by mouth daily.   SF 5000 Plus 1.1 % Crea dental cream Generic drug: sodium fluoride Take by mouth as directed.   Vitamin D-3 125 MCG (5000 UT) Tabs Take 1 capsule by mouth as directed. Take one capsule by mouth daily Mon thru Friday         Objective:   BP (!) 152/75   Pulse 65   Temp 97.8 F (36.6 C)   Ht _0  (1.727 m)   Wt 189 lb (85.7 kg)   SpO2 98%   BMI 28.74 kg/m   Wt Readings from Last 3 Encounters:  11/29/22 189 lb (85.7 kg)  09/18/22 199 lb (90.3 kg)  07/24/22 193 lb (87.5 kg)    Physical Exam Vitals and nursing note reviewed.  Constitutional:      General: She is not in acute distress.    Appearance: She is well-developed. She is not diaphoretic.  HENT:     Mouth/Throat:     Mouth: Mucous membranes are moist.     Pharynx: Oropharynx is clear. No oropharyngeal exudate or posterior oropharyngeal erythema.  Eyes:     Conjunctiva/sclera: Conjunctivae normal.  Cardiovascular:     Rate and Rhythm: Normal rate and regular rhythm.     Heart sounds: Normal heart sounds. No murmur heard. Pulmonary:     Effort: Pulmonary effort is normal. No respiratory distress.     Breath sounds: Examination of the right-lower field reveals rales. Rales present. No decreased breath sounds, wheezing or rhonchi.  Musculoskeletal:        General: No tenderness. Normal range of motion.  Skin:    General: Skin is warm and dry.     Findings: No rash.  Neurological:     Mental Status: She is alert and oriented to person, place, and time.     Coordination:  Coordination normal.  Psychiatric:        Behavior: Behavior normal.     Chest x-ray: Mild vascular congestion but otherwise clear.  Assessment & Plan:   Problem List Items Addressed This Visit       Other   Mild cognitive impairment   Other Visit Diagnoses     Mild memory disturbance    -  Primary   Relevant Orders   CBC with Differential/Platelet  CMP14+EGFR   Brain natriuretic peptide   DG Chest 2 View   Urinalysis, Complete   Urine Culture   SOB (shortness of breath)       Relevant Orders   CBC with Differential/Platelet   CMP14+EGFR   Brain natriuretic peptide   DG Chest 2 View   Urinalysis, Complete   Urine Culture   Delirium       Relevant Orders   Urinalysis, Complete   Urine Culture   MR Brain Wo Contrast     Mild memory dysfunction on MoCA but more concerned about acute delirium.  Mary-Tobie  Differential diagnosis of infection versus stroke versus sundowning, will do urinalysis and blood work and chest x-ray.  If all normal may consider MRI brain  Unable to do urine here in the office, will bring it back as they can  Will go ahead and treat congestion like bronchitis because she is short of breath  Follow up plan: Return if symptoms worsen or fail to improve.  Counseling provided for all of the vaccine components Orders Placed This Encounter  Procedures   Urine Culture   DG Chest 2 View   MR Brain Wo Contrast   CBC with Differential/Platelet   CMP14+EGFR   Brain natriuretic peptide   Urinalysis, Complete    Caryl Pina, MD [facility] 11/29/2022, 12:42 PM

## 2022-11-30 LAB — CBC WITH DIFFERENTIAL/PLATELET
Basophils Absolute: 0 10*3/uL (ref 0.0–0.2)
Basos: 0 %
EOS (ABSOLUTE): 0 10*3/uL (ref 0.0–0.4)
Eos: 1 %
Hematocrit: 38.2 % (ref 34.0–46.6)
Hemoglobin: 12.9 g/dL (ref 11.1–15.9)
Immature Grans (Abs): 0 10*3/uL (ref 0.0–0.1)
Immature Granulocytes: 0 %
Lymphocytes Absolute: 1.2 10*3/uL (ref 0.7–3.1)
Lymphs: 25 %
MCH: 30.9 pg (ref 26.6–33.0)
MCHC: 33.8 g/dL (ref 31.5–35.7)
MCV: 92 fL (ref 79–97)
Monocytes Absolute: 0.5 10*3/uL (ref 0.1–0.9)
Monocytes: 11 %
Neutrophils Absolute: 3 10*3/uL (ref 1.4–7.0)
Neutrophils: 63 %
Platelets: 220 10*3/uL (ref 150–450)
RBC: 4.17 x10E6/uL (ref 3.77–5.28)
RDW: 13.5 % (ref 11.7–15.4)
WBC: 4.8 10*3/uL (ref 3.4–10.8)

## 2022-11-30 LAB — CMP14+EGFR
ALT: 51 IU/L — ABNORMAL HIGH (ref 0–32)
AST: 99 IU/L — ABNORMAL HIGH (ref 0–40)
Albumin/Globulin Ratio: 1.6 (ref 1.2–2.2)
Albumin: 3.6 g/dL — ABNORMAL LOW (ref 3.8–4.8)
Alkaline Phosphatase: 75 IU/L (ref 44–121)
BUN/Creatinine Ratio: 15 (ref 12–28)
BUN: 11 mg/dL (ref 8–27)
Bilirubin Total: 0.3 mg/dL (ref 0.0–1.2)
CO2: 22 mmol/L (ref 20–29)
Calcium: 8.9 mg/dL (ref 8.7–10.3)
Chloride: 106 mmol/L (ref 96–106)
Creatinine, Ser: 0.74 mg/dL (ref 0.57–1.00)
Globulin, Total: 2.2 g/dL (ref 1.5–4.5)
Glucose: 96 mg/dL (ref 70–99)
Potassium: 3.9 mmol/L (ref 3.5–5.2)
Sodium: 143 mmol/L (ref 134–144)
Total Protein: 5.8 g/dL — ABNORMAL LOW (ref 6.0–8.5)
eGFR: 86 mL/min/{1.73_m2} (ref >59)

## 2022-11-30 LAB — BRAIN NATRIURETIC PEPTIDE: BNP: 20.4 pg/mL (ref 0.0–100.0)

## 2022-12-02 LAB — URINE CULTURE

## 2022-12-05 ENCOUNTER — Ambulatory Visit: Payer: Medicare PPO | Admitting: Orthopaedic Surgery

## 2022-12-05 ENCOUNTER — Encounter: Payer: Self-pay | Admitting: Orthopaedic Surgery

## 2022-12-05 ENCOUNTER — Ambulatory Visit (INDEPENDENT_AMBULATORY_CARE_PROVIDER_SITE_OTHER): Payer: Medicare PPO

## 2022-12-05 DIAGNOSIS — M170 Bilateral primary osteoarthritis of knee: Secondary | ICD-10-CM | POA: Diagnosis not present

## 2022-12-05 DIAGNOSIS — M25561 Pain in right knee: Secondary | ICD-10-CM

## 2022-12-05 MED ORDER — METHYLPREDNISOLONE ACETATE 40 MG/ML IJ SUSP
80.0000 mg | INTRAMUSCULAR | Status: AC | PRN
  Administered 2022-12-05: 80 mg via INTRA_ARTICULAR

## 2022-12-05 MED ORDER — LIDOCAINE HCL 1 % IJ SOLN
2.0000 mL | INTRAMUSCULAR | Status: AC | PRN
  Administered 2022-12-05: 2 mL

## 2022-12-05 MED ORDER — BUPIVACAINE HCL 0.25 % IJ SOLN
2.0000 mL | INTRAMUSCULAR | Status: AC | PRN
  Administered 2022-12-05: 2 mL via INTRA_ARTICULAR

## 2022-12-05 NOTE — Progress Notes (Signed)
Office Visit Note   Patient: Shelly Bennett           Date of Birth: Sep 06, 1951           MRN: [id number] Visit Date: 12/05/2022              Requested by: Worthy Rancher, MD [place],  [place] [address] PCP: Dettinger, Fransisca Kaufmann, MD   Assessment & Plan: Visit Diagnoses:  1. Acute pain of right knee   2. Bilateral primary osteoarthritis of knee     Plan: Shelly Bennett is a pleasant 71 year old woman who is accompanied by her family.  She has had a recent history of right knee pain.  She has been participating in a choir and going from a sitting to a standing position quite a bit.  She also had a respiratory infection and was found on the floor in her kitchen by her husband last week.  Unsure if she had a fall.  She has been trying topical Voltaren gel without much success.  She did recently have a steroid injection into her left knee and is done quite well.  X-rays today of the right knee demonstrate tricompartmental arthritis but most severe over the patellofemoral joint.  This correlates where most of her pain is.  Can try Aleve over-the-counter.  Also continue with Voltaren gel we will go forward with an injection today  Follow-Up Instructions: Return if symptoms worsen or fail to improve.   Orders:  Orders Placed This Encounter  Procedures   Large Joint Inj: R knee   XR KNEE 3 VIEW RIGHT   No orders of the defined types were placed in this encounter.     Procedures: Large Joint Inj: R knee on 12/05/2022 1:30 PM Indications: pain and diagnostic evaluation Details: 25 G 1.5 in needle, anteromedial approach  Arthrogram: No  Medications: 80 mg methylPREDNISolone acetate 40 MG/ML; 2 mL lidocaine 1 %; 2 mL bupivacaine 0.25 % Outcome: tolerated well, no immediate complications Procedure, treatment alternatives, risks and benefits explained, specific risks discussed. Consent was given by the patient.      Clinical Data: No additional  findings.   Subjective: Chief Complaint  Patient presents with   Right Knee - Pain    HPI Shelly Bennett is a 71 year old woman who comes in with her family today.  She has recent history of right anterior knee pain.  She has been doing activities that require sitting and standing several times.  She also had a recent respiratory infection was found on the floor of her kitchen.  Family is unsure if she had an injury to the knee at that time.  She normally ambulates with a cane but is using a walker today  Review of Systems  All other systems reviewed and are negative.    Objective: Vital Signs: There were no vitals taken for this visit.  Physical Exam Constitutional:      Appearance: Normal appearance.  Pulmonary:     Effort: Pulmonary effort is normal.  Neurological:     General: No focal deficit present.     Mental Status: She is alert.     Ortho Exam Right knee no effusion no erythema no warmth.  Her compartments are soft and nontender she is neurovascularly intact.  Minimal tenderness over the medial and lateral joint line but she does have more tenderness over the patella femoral joint with little patellar mobility.  Good varus valgus stability compartments are soft and nontender  Specialty Comments:  No specialty comments available.  Imaging: XR KNEE 3 VIEW RIGHT  Result Date: 12/05/2022 Three-view radiographs of her right knee demonstrate well-maintained alignment.  She does have tricompartmental arthritis with subchondral sclerosis and osteophyte formation.  Most significant over the patellofemoral joint.  Some narrowing of the medial compartment.  No ectopic calcification or acute change    PMFS History: Patient Active Problem List   Diagnosis Date Noted   Pain in right knee 12/05/2022   Pain in left knee 10/23/2022   Bilateral primary osteoarthritis of knee 10/04/2022   Gait abnormality 03/10/2020   Gastroesophageal reflux disease 10/19/2019   Third degree  uterine prolapse 10/19/2019   Spinal stenosis of lumbar region without neurogenic claudication 09/15/2019   Urinary and fecal incontinence 09/15/2019   Cystocele with rectocele 09/15/2019   Left lumbar radiculopathy 01/22/2018   Mild cognitive impairment 10/22/2017   Anxiety 10/22/2017   Aortic atherosclerosis ([place]) 12/04/2016   Vitamin D deficiency 08/19/2014   Osteopenia 08/12/2013   CAD (coronary artery disease), native coronary artery 03/22/2013   Aortic valve stenosis, mild 03/22/2013   Hyperlipemia 03/11/2013   Hypertension 03/11/2013   Allergic rhinitis 03/11/2013   IBS (irritable bowel syndrome) - with chronic recurrent abdominal pain 02/01/2012   Personal history of colonic polyps - adenoma 02/01/2012   Past Medical History:  Diagnosis Date   Adenomatous colon polyp    Allergic drug reaction 06/25/2015   Allergy    Anxiety    Arthritis    At risk for falls    fallen 4 times recently    CAD (coronary artery disease)    Dr. Burt Knack follows    Carpal tunnel syndrome    Chronic headaches    Coccydynia 02/01/2012   Cystocele    Diverticulosis    External hemorrhoids    Focal nodular hyperplasia of liver    Gait abnormality    GERD (gastroesophageal reflux disease)    some   HCAP (healthcare-associated pneumonia) 06/25/2015   Heart murmur    HLD (hyperlipidemia)    HTN (hypertension)    IBS (irritable bowel syndrome)    Memory loss    Neuromuscular disorder (HCC)    some neuropathy issues in the past- legs fibromyalgia in past-- past hx of steroid injections   Obesity    Osteoporosis    Otitis of left ear 02/01/2017   Pneumonia    Skin cancer    squamous cell - face    Family History  Problem Relation Age of Onset   Hyperlipidemia Mother    Hypertension Mother    Kidney disease Mother    Osteoporosis Mother    Heart murmur Mother    Dementia Mother    Prostate cancer Father    Colon cancer Father 87   Kidney disease Father    Heart disease Father     Alzheimer's disease Father    Hyperlipidemia Sister    Hypertension Sister    Rectal cancer Maternal Grandmother 43   Heart disease Maternal Grandfather    Heart disease Brother 52       not dx questionable    Colon polyps Neg Hx    Esophageal cancer Neg Hx    Stomach cancer Neg Hx    Breast cancer Neg Hx     Past Surgical History:  Procedure Laterality Date   CARPAL TUNNEL RELEASE     COLONOSCOPY  12/15/2008   diverticulosis, external hemorrhoids   KNEE SURGERY  05/22/2015   right  LAPAROSCOPY     SKIN SURGERY     squamous cell - right face    Social History   Occupational History   Occupation: Pharmacist, hospital retired  Tobacco Use   Smoking status: Never   Smokeless tobacco: Never  Vaping Use   Vaping Use: Never used  Substance and Sexual Activity   Alcohol use: No   Drug use: No   Sexual activity: Not Currently    Birth control/protection: None

## 2022-12-06 ENCOUNTER — Other Ambulatory Visit: Payer: Self-pay

## 2022-12-06 DIAGNOSIS — R7989 Other specified abnormal findings of blood chemistry: Secondary | ICD-10-CM

## 2022-12-11 ENCOUNTER — Other Ambulatory Visit: Payer: Self-pay | Admitting: Family Medicine

## 2022-12-31 ENCOUNTER — Ambulatory Visit: Payer: Medicare PPO | Admitting: Family Medicine

## 2022-12-31 ENCOUNTER — Encounter: Payer: Self-pay | Admitting: Family Medicine

## 2022-12-31 VITALS — BP 135/80 | HR 64 | Temp 98.3°F | Ht 68.0 in | Wt 200.0 lb

## 2022-12-31 DIAGNOSIS — I2510 Atherosclerotic heart disease of native coronary artery without angina pectoris: Secondary | ICD-10-CM | POA: Diagnosis not present

## 2022-12-31 DIAGNOSIS — R7989 Other specified abnormal findings of blood chemistry: Secondary | ICD-10-CM

## 2022-12-31 DIAGNOSIS — Z23 Encounter for immunization: Secondary | ICD-10-CM

## 2022-12-31 DIAGNOSIS — E782 Mixed hyperlipidemia: Secondary | ICD-10-CM | POA: Diagnosis not present

## 2022-12-31 DIAGNOSIS — G3184 Mild cognitive impairment, so stated: Secondary | ICD-10-CM

## 2022-12-31 DIAGNOSIS — G910 Communicating hydrocephalus: Secondary | ICD-10-CM

## 2022-12-31 DIAGNOSIS — R053 Chronic cough: Secondary | ICD-10-CM

## 2022-12-31 DIAGNOSIS — I10 Essential (primary) hypertension: Secondary | ICD-10-CM

## 2022-12-31 MED ORDER — DONEPEZIL HCL 10 MG PO TABS: 10.0000 mg | ORAL_TABLET | Freq: Every day | ORAL | 3 refills | Status: DC

## 2022-12-31 NOTE — Progress Notes (Signed)
BP 135/80   Pulse 64   Temp 98.3 F (36.8 C)   Ht '5\' 8"'$  (1.727 m)   Wt 200 lb (90.7 kg)   SpO2 98%   BMI 30.41 kg/m    Subjective:   Patient ID: Shelly Bennett, female    DOB: [birth year], 72 y.o.   MRN: [id number]  HPI: Shelly Bennett is a 72 y.o. female presenting on 12/31/2022 for Medical Management of Chronic Issues (F/u cough/Referral neurosurgeon/Flu shot)   HPI Chronic hydrocephalus noted on MRI Patient has chronic unchanged hydrocephalus noted on MRI and wants to go for specialist to see if this is from cerebral atrophy or from something else.  Hypertension Patient is currently on amlodipine and hydrochlorothiazide, and their blood pressure today is 135/80. Patient denies any lightheadedness or dizziness. Patient denies headaches, blurred vision, chest pains, shortness of breath, or weakness. Denies any side effects from medication and is content with current medication.   Hyperlipidemia and CAD Patient is coming in for recheck of his hyperlipidemia. The patient is currently taking Crestor. They deny any issues with myalgias or history of liver damage from it. They deny any focal numbness or weakness or chest pain.   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  She is currently taking Namenda but decided that they do not have the Aricept, they have not had it for some time.  Memory is about the same.  They would like to restart the Aricept.  Relevant past medical, surgical, family and social history reviewed and updated as indicated. Interim medical history since our last visit reviewed. Allergies and medications reviewed and updated.  Review of Systems  Constitutional:  Negative for chills and fever.  Eyes:  Negative for visual disturbance.  Respiratory:  Negative for chest tightness and shortness of breath.   Cardiovascular:  Negative for chest pain and leg swelling.  Genitourinary:  Negative for difficulty urinating and  dysuria.  Musculoskeletal:  Negative for back pain and gait problem.  Skin:  Negative for rash.  Neurological:  Negative for light-headedness and headaches.  Psychiatric/Behavioral:  Positive for confusion and decreased concentration. Negative for agitation, behavioral problems and sleep disturbance.   All other systems reviewed and are negative.   Per HPI unless specifically indicated above   Allergies as of 12/31/2022       Reactions   Neomycin-polymyxin-dexameth    Other reaction(s): eye redness   Codeine    ? Reaction ER visit.    Mold Extract [trichophyton]    Migraine   Penicillins Other (See Comments)   "drew my legs up as child" Pt has tolerated cephalexin in the past.   Ace Inhibitors Cough   Angiotensin Receptor Blockers Cough   Levaquin [levofloxacin] Rash   Per notes from outpatient provider   Vytorin [ezetimibe-simvastatin] Other (See Comments)   cramping   Zetia [ezetimibe] Other (See Comments)   cramping        Medication List        Accurate as of December 31, 2022 11:06 AM. If you have any questions, ask your nurse or doctor.          STOP taking these medications    doxycycline 100 MG tablet Commonly known as: VIBRA-TABS Stopped by: Fransisca Kaufmann Elisabet Gutzmer, MD       TAKE these medications    amLODipine 10 MG tablet Commonly known as: NORVASC Take 1 tablet (10 mg total) by mouth daily.   aspirin 81 MG tablet Take 81 mg  by mouth daily.   buPROPion 150 MG 24 hr tablet Commonly known as: Wellbutrin XL Take 1 tablet (150 mg total) by mouth daily.   CALCARB 600 PO Take by mouth.   donepezil 10 MG tablet Commonly known as: ARICEPT Take 1 tablet (10 mg total) by mouth at bedtime. What changed: additional instructions Changed by: Fransisca Kaufmann Ettie Krontz, MD   fexofenadine 180 MG tablet Commonly known as: ALLEGRA Take 180 mg by mouth daily.   fluticasone 50 MCG/ACT nasal spray Commonly known as: FLONASE Place 2 sprays into both nostrils  daily.   hydrochlorothiazide 25 MG tablet Commonly known as: HYDRODIURIL TAKE (1/2) TABLET DAILY.   HYDROcodone-acetaminophen 5-325 MG tablet Commonly known as: NORCO/VICODIN Take 1 tablet by mouth every 6 (six) hours as needed for moderate pain.   memantine 10 MG tablet Commonly known as: NAMENDA Take 1 tablet (10 mg total) by mouth 2 (two) times daily.   montelukast 10 MG tablet Commonly known as: SINGULAIR TAKE ONE TABLET AT BEDTIME   omega-3 acid ethyl esters 1 g capsule Commonly known as: LOVAZA TAKE TWO CAPSULES TWICE DAILY   rosuvastatin 20 MG tablet Commonly known as: CRESTOR Take 1 tablet (20 mg total) by mouth daily.   SF 5000 Plus 1.1 % Crea dental cream Generic drug: sodium fluoride Take by mouth as directed.   Vitamin D-3 125 MCG (5000 UT) Tabs Take 1 capsule by mouth as directed. Take one capsule by mouth daily Mon thru Friday         Objective:   BP 135/80   Pulse 64   Temp 98.3 F (36.8 C)   Ht '5\' 8"'$  (1.727 m)   Wt 200 lb (90.7 kg)   SpO2 98%   BMI 30.41 kg/m   Wt Readings from Last 3 Encounters:  12/31/22 200 lb (90.7 kg)  11/29/22 189 lb (85.7 kg)  09/18/22 199 lb (90.3 kg)    Physical Exam Vitals and nursing note reviewed.  Constitutional:      General: She is not in acute distress.    Appearance: She is well-developed. She is not diaphoretic.  Eyes:     Conjunctiva/sclera: Conjunctivae normal.  Cardiovascular:     Rate and Rhythm: Normal rate and regular rhythm.     Heart sounds: Normal heart sounds. No murmur heard. Pulmonary:     Effort: Pulmonary effort is normal. No respiratory distress.     Breath sounds: Normal breath sounds. No wheezing.  Musculoskeletal:        General: No tenderness. Normal range of motion.  Skin:    General: Skin is warm and dry.     Findings: No rash.  Neurological:     Mental Status: She is alert and oriented to person, place, and time.     Coordination: Coordination normal.  Psychiatric:         Behavior: Behavior normal.        Cognition and Memory: Memory is impaired. She exhibits impaired recent memory.       Assessment & Plan:   Problem List Items Addressed This Visit       Cardiovascular and Mediastinum   Hypertension   Relevant Orders   CBC with Differential/Platelet   CMP14+EGFR   Lipid panel   CAD (coronary artery disease), native coronary artery     Other   Hyperlipemia - Primary   Relevant Orders   CBC with Differential/Platelet   CMP14+EGFR   Lipid panel   Mild cognitive impairment   Relevant Medications  donepezil (ARICEPT) 10 MG tablet   Other Visit Diagnoses     Persistent cough       Chronic communicating hydrocephalus [facility])       Relevant Orders   Ambulatory referral to Neurosurgery   Elevated liver function tests       Relevant Orders   CMP14+EGFR       Will refer to neurosurgery for possible hydrocephalus.  He is  Will restart Aricept and refer to neurosurgery.  Check blood work today 2. Follow up plan: Return in about 6 months (around 07/01/2023), or if symptoms worsen or fail to improve, for Hypertension and hyperlipidemia.  Counseling provided for all of the vaccine components Orders Placed This Encounter  Procedures   CBC with Differential/Platelet   CMP14+EGFR   Lipid panel   Ambulatory referral to Neurosurgery    Caryl Pina, MD [facility] 12/31/2022, 11:06 AM

## 2023-01-01 LAB — CMP14+EGFR
ALT: 18 IU/L (ref 0–32)
AST: 19 IU/L (ref 0–40)
Albumin/Globulin Ratio: 2.2 (ref 1.2–2.2)
Albumin: 4.2 g/dL (ref 3.8–4.8)
Alkaline Phosphatase: 88 IU/L (ref 44–121)
BUN/Creatinine Ratio: 10 — ABNORMAL LOW (ref 12–28)
BUN: 8 mg/dL (ref 8–27)
Bilirubin Total: 0.4 mg/dL (ref 0.0–1.2)
CO2: 22 mmol/L (ref 20–29)
Calcium: 10.2 mg/dL (ref 8.7–10.3)
Chloride: 106 mmol/L (ref 96–106)
Creatinine, Ser: 0.78 mg/dL (ref 0.57–1.00)
Globulin, Total: 1.9 g/dL (ref 1.5–4.5)
Glucose: 89 mg/dL (ref 70–99)
Potassium: 4.1 mmol/L (ref 3.5–5.2)
Sodium: 142 mmol/L (ref 134–144)
Total Protein: 6.1 g/dL (ref 6.0–8.5)
eGFR: 81 mL/min/{1.73_m2} (ref >59)

## 2023-01-01 LAB — CBC WITH DIFFERENTIAL/PLATELET
Basophils Absolute: 0.1 10*3/uL (ref 0.0–0.2)
Basos: 1 %
EOS (ABSOLUTE): 0.1 10*3/uL (ref 0.0–0.4)
Eos: 2 %
Hematocrit: 35.6 % (ref 34.0–46.6)
Hemoglobin: 11.8 g/dL (ref 11.1–15.9)
Immature Grans (Abs): 0.1 10*3/uL (ref 0.0–0.1)
Immature Granulocytes: 1 %
Lymphocytes Absolute: 2 10*3/uL (ref 0.7–3.1)
Lymphs: 26 %
MCH: 30.3 pg (ref 26.6–33.0)
MCHC: 33.1 g/dL (ref 31.5–35.7)
MCV: 91 fL (ref 79–97)
Monocytes Absolute: 0.5 10*3/uL (ref 0.1–0.9)
Monocytes: 7 %
Neutrophils Absolute: 4.9 10*3/uL (ref 1.4–7.0)
Neutrophils: 63 %
Platelets: 269 10*3/uL (ref 150–450)
RBC: 3.9 x10E6/uL (ref 3.77–5.28)
RDW: 13.5 % (ref 11.7–15.4)
WBC: 7.6 10*3/uL (ref 3.4–10.8)

## 2023-01-01 LAB — LIPID PANEL
Chol/HDL Ratio: 2.3 ratio (ref 0.0–4.4)
Cholesterol, Total: 162 mg/dL (ref 100–199)
HDL: 70 mg/dL (ref >39)
LDL Chol Calc (NIH): 69 mg/dL (ref 0–99)
Triglycerides: 134 mg/dL (ref 0–149)
VLDL Cholesterol Cal: 23 mg/dL (ref 5–40)

## 2023-01-08 ENCOUNTER — Telehealth: Payer: Self-pay | Admitting: Neurology

## 2023-01-08 NOTE — Telephone Encounter (Signed)
After checking D[employer] phone rep left a vm asking pt to call and schedule appointment with Dr Krista Blue

## 2023-01-08 NOTE — Telephone Encounter (Signed)
Pt daughter is calling. Stated she wants to make a appointment for Dr. Krista Blue to go over pt MRI results. PCP placed a referral for neurosurgeon but she was told they needed to see Dr. Krista Blue first for a LB puncture.

## 2023-01-12 ENCOUNTER — Other Ambulatory Visit: Payer: Self-pay | Admitting: Family Medicine

## 2023-01-12 DIAGNOSIS — J301 Allergic rhinitis due to pollen: Secondary | ICD-10-CM

## 2023-01-14 ENCOUNTER — Other Ambulatory Visit: Payer: Self-pay | Admitting: Family Medicine

## 2023-01-14 DIAGNOSIS — J301 Allergic rhinitis due to pollen: Secondary | ICD-10-CM

## 2023-01-14 MED ORDER — MONTELUKAST SODIUM 10 MG PO TABS: 10.0000 mg | ORAL_TABLET | Freq: Every day | ORAL | 0 refills | Status: DC

## 2023-01-24 ENCOUNTER — Ambulatory Visit: Payer: Medicare PPO | Admitting: Neurology

## 2023-01-24 ENCOUNTER — Encounter: Payer: Self-pay | Admitting: Neurology

## 2023-01-24 VITALS — BP 152/82 | HR 70 | Ht 68.0 in | Wt 189.0 lb

## 2023-01-24 DIAGNOSIS — G20C Parkinsonism, unspecified: Secondary | ICD-10-CM

## 2023-01-24 DIAGNOSIS — R32 Unspecified urinary incontinence: Secondary | ICD-10-CM

## 2023-01-24 DIAGNOSIS — R4189 Other symptoms and signs involving cognitive functions and awareness: Secondary | ICD-10-CM

## 2023-01-24 MED ORDER — CARBIDOPA-LEVODOPA 25-100 MG PO TABS: 1.0000 | ORAL_TABLET | Freq: Three times a day (TID) | ORAL | 6 refills | Status: DC

## 2023-01-24 NOTE — Progress Notes (Signed)
Chief Complaint  Patient presents with   Follow-up    Rm 17. Patient with daughter and husband. 12.10.23 husband found patient in floor with urine and had trouble getting memory and couldn't move. Now having trouble with bowel and bladder, worsening memory and now uses a walker where as before she used a cane. Wants MRI reviewed further from prior dr. Also looking to see neuro-surgeon at end of February and needs referral for LP if needed.       ASSESSMENT AND PLAN  Shelly Bennett is a 72 y.o. female   Cognitive impairment and Parkinsonian features  Most consistent with central nervous system degenerative disorder,  Personally reviewed and compared to previous MRIs brain scans, mild small vessel disease, moderate atrophy, ventriculomegaly, overall stable compared to previous scan in 2020,  MRI cervical spine in May 2021 showed multilevel degenerative changes, with mild canal stenosis variable degree of foraminal narrowing  MRI of lumbar spine in 2019 showed degenerative changes, acute mild L1-2 compression fracture, moderate to severe canal stenosis at L3-4,  Discussed with patient and her family, decided to proceed with repeat MRI of the cervical, lumbar spine, to evaluate the progress  [facility]  Referred to physical therapy  Empirically treated with Sinemet 25/100 mg 3 times daily    DIAGNOSTIC DATA (LABS, IMAGING, TESTING) - I reviewed patient records, labs, notes, testing and imaging myself where available.   MEDICAL HISTORY: Shelly Bennett is a 72 years old female, seen in refer by her primary care doctor Redge Gainer for evaluation of anxiety, memory loss, initial evaluation was on November 6th 2018.    I have reviewed and summarized the referring note, she has history of hypertension, coronary artery disease, hyperlipidemia, is a retired Automotive engineer. She drove herself to clinic today.   She reported stress, she is the main caretaker of her  mother that is 25 years old, since 2017, she was noted to repeat herself, forget peoples name, displaced pains,   Since 2018, she also suffered depression anxiety, because of the strained relationship, she is taking BuSpar, and Paxil since August 2018 which seems to help her some,   She has mild gait abnormality due to previous left knee injury.   Laboratory evaluations, B12 474, normal liver functional tests, vitamin D level 57, lipid profile LDL 82, total cholesterol 169,   UPDATE Feb 6th 2019: She continue has mild memory loss, Mini-Mental Status Examination 28/30, MRI of the brain showed moderate generalized atrophy, ventriculomegaly mild supratentorium small vessel disease   She also complains of chronic left low back pain radiating pain to left hip, I have personally reviewed MRI lumbar in 2016, multilevel degenerative changes, most severe at L3-4, with moderate spinal stenosis, bilateral lateral recess stenosis.   UPDATE August 6th 2019: She still has mild memory loss, misplace things, her mother passed away on 2018-06-11, her left leg pain, low back pain has much improved after physical therapy   UPDATE March 10 2020: She is accompanied by her daughter [name] at today's visit, she had worsening gait abnormality, memory loss, initially contributed to her depression of losing her mother in 2020, but her symptoms continue to progress over the past 1 year, especially her gait abnormality, she fell few times in her yard, went to emergency room on February 10, 2020, fell about 4 feet off the edge of her porch, landed on her right side   She denies significant low back pain, denies radiating pain to bilateral  lower extremity, or lower extremity paresthesia, denies bowel bladder incontinence   I personally reviewed MRI of the brain in December 2020, generalized atrophy, mild supratentorial and small vessel disease, there was no acute abnormalities.   Laboratory evaluations in December 2020,  positive COVID-19, normal and active RPR, Lyme titer, B12, ESR, arthritis panel, vitamin D, CBC, CMP elevated high-sensitivity C-reactive protein 4.8, positive scleroderma antibody, normal thyroid functional test   I personally reviewed MRI of lumbar spine in December 2019, severe spinal stenosis at L3-4, due to circumferential disc bulging, moderate facet and ligamentum flavum hypertrophy, with progressive severe spinal stenosis, mild left greater than right lateral recess stenosis, acute mild L1 and 2 compression fracture   UPDATE June 16 2020: She is accompanied by her daughter [name] at today's clinical visit, her memory is relatively stable, Mini-Mental Status Examination in March was 28/30,   She was noted to have mild worsening of gait abnormality, contributed to her low back, hip pain We also personally reviewed MRI of the cervical spine in May 2021, multilevel degenerative changes, but no evidence of significant canal or foraminal stenosis.   MRI of brain in December 2020, no acute abnormality, mild supratentorium small vessel disease, unchanged mild enlarged bilateral lateral ventricle and third ventricle, mildly out of proportion to generalized brain atrophy, but no significant change compared to previous MRI in 2018  UPDATE Jan 24 2023: She was fairly stable before acute event in December 2023, 1 day she was found on the kitchen floor, with bowel and bladder incontinence, apparently she was trying to get up in the middle of the night, patient has no recollection of the event,  She remained confused for the next 2 days, does not know where she was, increased gait abnormality, was seen by primary care physician, Personally reviewed MRI of the brain November 29, 2022, no acute intracranial abnormality, mild small vessel disease moderate cerebral atrophy, compared to previous MRIs in 2020, overall no significant change  Daughter reported a week prior to the incident, she was still driving,  attending Christmas concert,  Since the event, she has to rely on her walker, confused, difficulty following recipe, increased gait abnormality, complains of low back pain, bowel and bladder incontinence, has to wear depends  We reviewed previous imaging studies, MRI cervical spine May 2021, multilevel degenerative changes, no significant canal stenosis, variable degree of foraminal narrowing at C3-4, C4-5,  MRI of lumbar in December 2019, acute mild L1-2 compression fracture, multilevel degenerative changes most noticeable at L3-4 with severe spinal stenosis  PHYSICAL EXAM:   Vitals:   01/24/23 1343  BP: (!) 152/82  Pulse: 70  Weight: 189 lb (85.7 kg)  Height: '5\' 8"'$  (1.727 m)   Body mass index is 28.74 kg/m.  PHYSICAL EXAMNIATION:  Gen: NAD, conversant, well nourised, well groomed                     Cardiovascular: Regular rate rhythm, no peripheral edema, warm, nontender. Eyes: Conjunctivae clear without exudates or hemorrhage Neck: Supple, no carotid bruits. Pulmonary: Clear to auscultation bilaterally   NEUROLOGICAL EXAM:  MENTAL STATUS: Speech/cognition: Awake, alert, oriented to history taking and casual conversation    01/24/2023    1:44 PM 11/29/2022    4:26 PM 03/10/2020    3:07 PM  MMSE - Mini Mental State Exam  Orientation to time '1 4 5  '$ Orientation to Place '5 5 5  '$ Registration '3 3 3  '$ Attention/ Calculation '3 5 5  '$ Recall  $'2 3 1  'F$ Language- name 2 objects '2 2 2  '$ Language- repeat 1 0 1  Language- follow 3 step command '3 3 3  '$ Language- read & follow direction 1 0 1  Write a sentence '1 1 1  '$ Copy design '1 1 1  '$ Total score '23 27 28    '$ CRANIAL NERVES: CN II: Visual fields are full to confrontation. Pupils are round equal and briskly reactive to light. CN III, IV, VI: extraocular movement are normal. No ptosis. CN V: Facial sensation is intact to light touch CN VII: Face is symmetric with normal eye closure  CN VIII: Hearing is normal to causal  conversation. CN IX, X: Phonation is normal. CN XI: Head turning and shoulder shrug are intact  MOTOR: Patient drift of left upper extremity, left more than right rigidity, bradykinesia,  REFLEXES: Brisk reflex, more noticeable at left upper and lower extremity, left Babinski sign  SENSORY: Intact to light touch, pinprick and vibratory sensation are intact in fingers and toes.  COORDINATION: There is no trunk or limb dysmetria noted.  GAIT/STANCE: Need push-up to get up from seated position, tends to hold left arm in elbow flexion, left toe point outwards, stiff, cautious, unsteady gait dragging left leg, retropulse instability  REVIEW OF SYSTEMS:  Full 14 system review of systems performed and notable only for as above All other review of systems were negative.   ALLERGIES: Allergies  Allergen Reactions   Neomycin-Polymyxin-Dexameth     Other reaction(s): eye redness   Codeine     ? Reaction ER visit.    Mold Extract [Trichophyton]     Migraine   Penicillins Other (See Comments)    "drew my legs up as child" Pt has tolerated cephalexin in the past.   Ace Inhibitors Cough   Angiotensin Receptor Blockers Cough   Levaquin [Levofloxacin] Rash    Per notes from outpatient provider   Vytorin [Ezetimibe-Simvastatin] Other (See Comments)    cramping   Zetia [Ezetimibe] Other (See Comments)    cramping    HOME MEDICATIONS: Current Outpatient Medications  Medication Sig Dispense Refill   amLODipine (NORVASC) 10 MG tablet Take 1 tablet (10 mg total) by mouth daily. 90 tablet 3   aspirin 81 MG tablet Take 81 mg by mouth daily.      buPROPion (WELLBUTRIN XL) 150 MG 24 hr tablet TAKE ONE TABLET ONCE DAILY 90 tablet 1   Calcium Carbonate (CALCARB 600 PO) Take by mouth.     Cholecalciferol (VITAMIN D-3) 5000 UNITS TABS Take 1 capsule by mouth as directed. Take one capsule by mouth daily Mon thru Friday     donepezil (ARICEPT) 10 MG tablet Take 1 tablet (10 mg total) by mouth at  bedtime. 90 tablet 3   fexofenadine (ALLEGRA) 180 MG tablet Take 180 mg by mouth daily.     fluticasone (FLONASE) 50 MCG/ACT nasal spray Place 2 sprays into both nostrils daily. 16 g 3   hydrochlorothiazide (HYDRODIURIL) 25 MG tablet TAKE (1/2) TABLET DAILY. 45 tablet 3   HYDROcodone-acetaminophen (NORCO/VICODIN) 5-325 MG tablet Take 1 tablet by mouth every 6 (six) hours as needed for moderate pain. 30 tablet 0   memantine (NAMENDA) 10 MG tablet Take 1 tablet (10 mg total) by mouth 2 (two) times daily. 180 tablet 3   montelukast (SINGULAIR) 10 MG tablet Take 1 tablet (10 mg total) by mouth at bedtime. 90 tablet 0   omega-3 acid ethyl esters (LOVAZA) 1 g capsule TAKE TWO CAPSULES TWICE DAILY  360 capsule 3   rosuvastatin (CRESTOR) 20 MG tablet Take 1 tablet (20 mg total) by mouth daily. 90 tablet 3   SF 5000 PLUS 1.1 % CREA dental cream Take by mouth as directed.     No current facility-administered medications for this visit.    PAST MEDICAL HISTORY: Past Medical History:  Diagnosis Date   Adenomatous colon polyp    Allergic drug reaction 06/25/2015   Allergy    Anxiety    Arthritis    At risk for falls    fallen 4 times recently    CAD (coronary artery disease)    Dr. Burt Knack follows    Carpal tunnel syndrome    Chronic headaches    Coccydynia 02/01/2012   Cystocele    Diverticulosis    External hemorrhoids    Focal nodular hyperplasia of liver    Gait abnormality    GERD (gastroesophageal reflux disease)    some   HCAP (healthcare-associated pneumonia) 06/25/2015   Heart murmur    HLD (hyperlipidemia)    HTN (hypertension)    IBS (irritable bowel syndrome)    Memory loss    Neuromuscular disorder ([place])    some neuropathy issues in the past- legs fibromyalgia in past-- past hx of steroid injections   Obesity    Osteoporosis    Otitis of left ear 02/01/2017   Pneumonia    Skin cancer    squamous cell - face    PAST SURGICAL HISTORY: Past Surgical History:  Procedure  Laterality Date   CARPAL TUNNEL RELEASE     COLONOSCOPY  12/15/2008   diverticulosis, external hemorrhoids   KNEE SURGERY  05/22/2015   right   LAPAROSCOPY     SKIN SURGERY     squamous cell - right face     FAMILY HISTORY: Family History  Problem Relation Age of Onset   Hyperlipidemia Mother    Hypertension Mother    Kidney disease Mother    Osteoporosis Mother    Heart murmur Mother    Dementia Mother    Prostate cancer Father    Colon cancer Father 14   Kidney disease Father    Heart disease Father    Alzheimer's disease Father    Hyperlipidemia Sister    Hypertension Sister    Rectal cancer Maternal Grandmother 29   Heart disease Maternal Grandfather    Heart disease Brother 23       not dx questionable    Colon polyps Neg Hx    Esophageal cancer Neg Hx    Stomach cancer Neg Hx    Breast cancer Neg Hx     SOCIAL HISTORY: Social History   Socioeconomic History   Marital status: Married    Spouse name: [name]   Number of children: 4   Years of education: 16   Highest education level: Bachelor's degree (e.g., BA, AB, BS)  Occupational History   Occupation: Pharmacist, hospital retired  Tobacco Use   Smoking status: Never   Smokeless tobacco: Never  Vaping Use   Vaping Use: Never used  Substance and Sexual Activity   Alcohol use: No   Drug use: No   Sexual activity: Not Currently    Birth control/protection: None  Other Topics Concern   Not on file  Social History Narrative   Lives at home with her husband.   Right-handed.   1 cup coffee each morning. 2-3 small soda cans each day.   Social Determinants of Health   Financial Resource Strain:  Low Risk  (07/24/2022)   Overall Financial Resource Strain (CARDIA)    Difficulty of Paying Living Expenses: Not hard at all  Food Insecurity: No Food Insecurity (07/24/2022)   Hunger Vital Sign    Worried About Running Out of Food in the Last Year: Never true    Ran Out of Food in the Last Year: Never true  Transportation  Needs: No Transportation Needs (07/24/2022)   PRAPARE - Hydrologist (Medical): No    Lack of Transportation (Non-Medical): No  Physical Activity: Insufficiently Active (07/24/2022)   Exercise Vital Sign    Days of Exercise per Week: 6 days    Minutes of Exercise per Session: 20 min  Stress: No Stress Concern Present (07/24/2022)   English    Feeling of Stress : Only a little  Social Connections: Socially Integrated (07/24/2022)   Social Connection and Isolation Panel [NHANES]    Frequency of Communication with Friends and Family: More than three times a week    Frequency of Social Gatherings with Friends and Family: More than three times a week    Attends Religious Services: More than 4 times per year    Active Member of [employer] or Organizations: Yes    Attends Archivist Meetings: More than 4 times per year    Marital Status: Married  Human resources officer Violence: Not At Risk (07/24/2022)   Humiliation, Afraid, Rape, and Kick questionnaire    Fear of Current or Ex-Partner: No    Emotionally Abused: No    Physically Abused: No    Sexually Abused: No      Marcial Pacas, M.D. Ph.D.  [facility] [address], [place], [place] [address] Ph: [phone] Fax: [phone]  CC:  Dettinger, Fransisca Kaufmann, MD [place],  [place] [address]  Dettinger, Fransisca Kaufmann, MD

## 2023-01-30 DIAGNOSIS — B0050 Herpesviral ocular disease, unspecified: Secondary | ICD-10-CM | POA: Diagnosis not present

## 2023-01-30 DIAGNOSIS — H2513 Age-related nuclear cataract, bilateral: Secondary | ICD-10-CM | POA: Diagnosis not present

## 2023-01-30 DIAGNOSIS — H02403 Unspecified ptosis of bilateral eyelids: Secondary | ICD-10-CM | POA: Diagnosis not present

## 2023-02-05 ENCOUNTER — Telehealth: Payer: Self-pay | Admitting: Neurology

## 2023-02-05 NOTE — Telephone Encounter (Signed)
Shelly Bennett: [id number] exp. 02/05/23-03/07/23 sent to [facility] [phone]

## 2023-02-05 NOTE — Telephone Encounter (Signed)
[employer] NPR sent to [facility] [phone]

## 2023-02-06 ENCOUNTER — Ambulatory Visit: Payer: Medicare PPO | Attending: Neurology

## 2023-02-06 ENCOUNTER — Other Ambulatory Visit: Payer: Self-pay

## 2023-02-06 DIAGNOSIS — R4189 Other symptoms and signs involving cognitive functions and awareness: Secondary | ICD-10-CM | POA: Diagnosis not present

## 2023-02-06 DIAGNOSIS — R2681 Unsteadiness on feet: Secondary | ICD-10-CM | POA: Diagnosis not present

## 2023-02-06 DIAGNOSIS — R32 Unspecified urinary incontinence: Secondary | ICD-10-CM | POA: Insufficient documentation

## 2023-02-06 DIAGNOSIS — G20C Parkinsonism, unspecified: Secondary | ICD-10-CM | POA: Diagnosis not present

## 2023-02-06 DIAGNOSIS — M6281 Muscle weakness (generalized): Secondary | ICD-10-CM | POA: Insufficient documentation

## 2023-02-06 NOTE — Therapy (Signed)
OUTPATIENT PHYSICAL THERAPY NEURO EVALUATION   Patient Name: Shelly Bennett MRN: [id number] DOB:[birth year], 72 y.o., female Today's Date: 02/06/2023   PCP: Dettinger, Fransisca Kaufmann, MD REFERRING PROVIDER: Marcial Pacas, MD   END OF SESSION:  PT End of Session - 02/06/23 1303     Visit Number 1    Number of Visits 12    Date for PT Re-Evaluation 04/12/23    PT Start Time 1304    PT Stop Time 27    PT Time Calculation (min) 41 min    Activity Tolerance Patient tolerated treatment well    Behavior During Therapy [facility] for tasks assessed/performed             Past Medical History:  Diagnosis Date   Adenomatous colon polyp    Allergic drug reaction 06/25/2015   Allergy    Anxiety    Arthritis    At risk for falls    fallen 4 times recently    CAD (coronary artery disease)    Dr. Burt Knack follows    Carpal tunnel syndrome    Chronic headaches    Coccydynia 02/01/2012   Cystocele    Diverticulosis    External hemorrhoids    Focal nodular hyperplasia of liver    Gait abnormality    GERD (gastroesophageal reflux disease)    some   HCAP (healthcare-associated pneumonia) 06/25/2015   Heart murmur    HLD (hyperlipidemia)    HTN (hypertension)    IBS (irritable bowel syndrome)    Memory loss    Neuromuscular disorder ([place])    some neuropathy issues in the past- legs fibromyalgia in past-- past hx of steroid injections   Obesity    Osteoporosis    Otitis of left ear 02/01/2017   Pneumonia    Skin cancer    squamous cell - face   Past Surgical History:  Procedure Laterality Date   CARPAL TUNNEL RELEASE     COLONOSCOPY  12/15/2008   diverticulosis, external hemorrhoids   KNEE SURGERY  05/22/2015   right   LAPAROSCOPY     SKIN SURGERY     squamous cell - right face    Patient Active Problem List   Diagnosis Date Noted   Parkinsonism 01/24/2023   Urinary incontinence 01/24/2023   Pain in right knee 12/05/2022   Pain in left knee 10/23/2022   Bilateral primary  osteoarthritis of knee 10/04/2022   Gait abnormality 03/10/2020   Gastroesophageal reflux disease 10/19/2019   Third degree uterine prolapse 10/19/2019   Spinal stenosis of lumbar region without neurogenic claudication 09/15/2019   Urinary and fecal incontinence 09/15/2019   Cystocele with rectocele 09/15/2019   Left lumbar radiculopathy 01/22/2018   Cognitive impairment 10/22/2017   Anxiety 10/22/2017   Aortic atherosclerosis ([place]) 12/04/2016   Vitamin D deficiency 08/19/2014   Osteopenia 08/12/2013   CAD (coronary artery disease), native coronary artery 03/22/2013   Aortic valve stenosis, mild 03/22/2013   Hyperlipemia 03/11/2013   Hypertension 03/11/2013   Allergic rhinitis 03/11/2013   IBS (irritable bowel syndrome) - with chronic recurrent abdominal pain 02/01/2012   Personal history of colonic polyps - adenoma 02/01/2012    ONSET DATE: November-December 2023  REFERRING DIAG: Cognitive impairment; Parkinsonism, unspecified Parkinsonism type; Urinary incontinence, unspecified type   THERAPY DIAG:  Unsteadiness on feet  Muscle weakness (generalized)  Rationale for Evaluation and Treatment: Rehabilitation  SUBJECTIVE:  SUBJECTIVE STATEMENT: Patient reports that she has been falling and having some left leg weakness. She is scheduled to have an MRI on her neck and back on 03/04/23 to assess for the possibility of a Parkinson's diagnosis. She has fallen multiple times, but the worst one was when she fell off her deck and hit her head on a brick wall. However, she has not fallen since between Thanksgiving or Christmas last year. She has noticed that she can walk short distances without her walker, but she primarily uses a walker to get around.  Pt accompanied by: self and significant  other  PERTINENT HISTORY: Hypertension, osteoporosis, osteoarthritis, anxiety, allergies, and memory impairments  PAIN:  Are you having pain? Yes: NPRS scale: 5/10 Pain location: left leg Pain description: throbbing and tingling  PRECAUTIONS: Fall  WEIGHT BEARING RESTRICTIONS: No  FALLS: Has patient fallen in last 6 months? Yes. Number of falls 2  LIVING ENVIRONMENT: Lives with: Lives in: House/apartment Stairs: No Has following equipment at home: [place] - 4 wheeled  PLOF: Independent  PATIENT GOALS: improved safety and balance, improved ease getting into and out of her bed  OBJECTIVE:   COGNITION: Overall cognitive status: Within functional limits for tasks assessed   SENSATION: Patient reports tingling in her left lower extremity with no dermatomal pattern. Light touch: increased sensitivity along L4-5 dermatomes  COORDINATION: Mild tremor observed  DTRs:  Patella 1 = Trace  and Achilles 1 = Trace   POSTURE: flexed trunk   LOWER EXTREMITY ROM: WFL for activities assessed  LOWER EXTREMITY MMT:    MMT Right Eval Left Eval  Hip flexion 4/5 4-/5  Hip extension    Hip abduction    Hip adduction    Hip internal rotation    Hip external rotation    Knee flexion 4+/5 4+/5  Knee extension 4/5; crepitus 4-/5; crepitus  Ankle dorsiflexion 4+/5 4+/5  Ankle plantarflexion    Ankle inversion    Ankle eversion    (Blank rows = not tested)  BED MOBILITY:  To be assessed at follow-up appointments as able  TRANSFERS: Assistive device utilized: Environmental consultant - 4 wheeled  Sit to stand: Modified independence and required UE support from arm rests Stand to sit: Modified independence and required UE support from armrest  GAIT: Gait pattern: step to pattern, decreased step length- Left, decreased stance time- Right, decreased stride length, decreased hip/knee flexion- Right, decreased hip/knee flexion- Left, decreased ankle dorsiflexion- Right, decreased ankle dorsiflexion-  Left, shuffling, decreased trunk rotation, poor foot clearance- Right, and poor foot clearance- Left Assistive device utilized: Walker - 4 wheeled Level of assistance: Modified independence  FUNCTIONAL TESTS:  5 times sit to stand: 48.98 seconds with UE support from armrests Timed up and go (TUG): 26.53 seconds with Rolator  Slump Test: negative  TODAY'S TREATMENT:  DATE:     PATIENT EDUCATION: Education details: Prognosis, plan of care, and goals for therapy Person educated: Patient and Spouse Education method: Explanation Education comprehension: verbalized understanding  HOME EXERCISE PROGRAM:   GOALS: Goals reviewed with patient? Yes  SHORT TERM GOALS: Target date: 02/27/23  Patient will be independent with her initial HEP. Baseline: Goal status: INITIAL  2.  Patient will improve her 5 times sit to stand to 38 seconds or less for improved lower extremity power. Baseline:  Goal status: INITIAL  3.  Patient will improve her timed up and go to 20 seconds or less for improved safety. Baseline:  Goal status: INITIAL  LONG TERM GOALS: Target date: 03/20/23  Patient will be independent with her advanced HEP. Baseline:  Goal status: INITIAL  2.  Patient will improve her 5 times sit to stand to 30 seconds or less for improved lower extremity power. Baseline:  Goal status: INITIAL  3.  Patient will improve her timed up and go to 15 seconds or less for improved safety. Baseline:  Goal status: INITIAL  4.  Patient will be able to transfer from sitting to standing with minimal difficulty for improved independence. Baseline: Initial evaluation: Maximal difficulty Goal status: INITIAL  ASSESSMENT:  CLINICAL IMPRESSION: Patient is a 72 y.o. female who was seen today for physical therapy evaluation and treatment for unsteadiness on her feet and  Parkinson's-like symptoms.  She is at a high fall risk as evidenced by her lower extremity weakness, timed up and go, 5 times sit to stand, and her history of falling.  Recommend that she continue with skilled physical therapy to address her impairments to maximize her safety and functional mobility.  OBJECTIVE IMPAIRMENTS: Abnormal gait, decreased activity tolerance, decreased balance, decreased mobility, difficulty walking, decreased strength, impaired sensation, postural dysfunction, and pain.   ACTIVITY LIMITATIONS: carrying, lifting, standing, squatting, sleeping, stairs, transfers, bed mobility, dressing, locomotion level, and caring for others  PARTICIPATION LIMITATIONS: meal prep, cleaning, laundry, shopping, and community activity  PERSONAL FACTORS: Past/current experiences, Time since onset of injury/illness/exacerbation, Transportation, and 3+ comorbidities: Hypertension, osteoporosis, osteoarthritis, anxiety, allergies, and memory impairments  are also affecting patient's functional outcome.   REHAB POTENTIAL: Fair    CLINICAL DECISION MAKING: Evolving/moderate complexity  EVALUATION COMPLEXITY: Moderate  PLAN:  PT FREQUENCY: 2x/week  PT DURATION: 6 weeks  PLANNED INTERVENTIONS: Therapeutic exercises, Therapeutic activity, Neuromuscular re-education, Balance training, Gait training, Patient/Family education, Self Care, Stair training, Electrical stimulation, Cryotherapy, Moist heat, Manual therapy, and Re-evaluation  PLAN FOR NEXT SESSION: NuStep, lower extremity strengthening, and balance interventions   Darlin Coco, PT 02/06/2023, 4:03 PM

## 2023-02-08 ENCOUNTER — Ambulatory Visit: Payer: Medicare PPO

## 2023-02-08 DIAGNOSIS — M6281 Muscle weakness (generalized): Secondary | ICD-10-CM | POA: Diagnosis not present

## 2023-02-08 DIAGNOSIS — G20C Parkinsonism, unspecified: Secondary | ICD-10-CM | POA: Diagnosis not present

## 2023-02-08 DIAGNOSIS — R4189 Other symptoms and signs involving cognitive functions and awareness: Secondary | ICD-10-CM | POA: Diagnosis not present

## 2023-02-08 DIAGNOSIS — R2681 Unsteadiness on feet: Secondary | ICD-10-CM

## 2023-02-08 DIAGNOSIS — R32 Unspecified urinary incontinence: Secondary | ICD-10-CM | POA: Diagnosis not present

## 2023-02-08 NOTE — Therapy (Signed)
OUTPATIENT PHYSICAL THERAPY NEURO TREATMENT   Patient Name: Shelly Bennett MRN: [id number] DOB:[birth year], 72 y.o., female Today's Date: 02/08/2023   PCP: Dettinger, Fransisca Kaufmann, MD REFERRING PROVIDER: Marcial Pacas, MD   END OF SESSION:  PT End of Session - 02/08/23 1113     Visit Number 2    Number of Visits 12    Date for PT Re-Evaluation 04/12/23    PT Start Time 1115    PT Stop Time 1158    PT Time Calculation (min) 43 min    Activity Tolerance Patient tolerated treatment well    Behavior During Therapy [facility] for tasks assessed/performed              Past Medical History:  Diagnosis Date   Adenomatous colon polyp    Allergic drug reaction 06/25/2015   Allergy    Anxiety    Arthritis    At risk for falls    fallen 4 times recently    CAD (coronary artery disease)    Dr. Burt Knack follows    Carpal tunnel syndrome    Chronic headaches    Coccydynia 02/01/2012   Cystocele    Diverticulosis    External hemorrhoids    Focal nodular hyperplasia of liver    Gait abnormality    GERD (gastroesophageal reflux disease)    some   HCAP (healthcare-associated pneumonia) 06/25/2015   Heart murmur    HLD (hyperlipidemia)    HTN (hypertension)    IBS (irritable bowel syndrome)    Memory loss    Neuromuscular disorder ([place])    some neuropathy issues in the past- legs fibromyalgia in past-- past hx of steroid injections   Obesity    Osteoporosis    Otitis of left ear 02/01/2017   Pneumonia    Skin cancer    squamous cell - face   Past Surgical History:  Procedure Laterality Date   CARPAL TUNNEL RELEASE     COLONOSCOPY  12/15/2008   diverticulosis, external hemorrhoids   KNEE SURGERY  05/22/2015   right   LAPAROSCOPY     SKIN SURGERY     squamous cell - right face    Patient Active Problem List   Diagnosis Date Noted   Parkinsonism 01/24/2023   Urinary incontinence 01/24/2023   Pain in right knee 12/05/2022   Pain in left knee 10/23/2022   Bilateral primary  osteoarthritis of knee 10/04/2022   Gait abnormality 03/10/2020   Gastroesophageal reflux disease 10/19/2019   Third degree uterine prolapse 10/19/2019   Spinal stenosis of lumbar region without neurogenic claudication 09/15/2019   Urinary and fecal incontinence 09/15/2019   Cystocele with rectocele 09/15/2019   Left lumbar radiculopathy 01/22/2018   Cognitive impairment 10/22/2017   Anxiety 10/22/2017   Aortic atherosclerosis ([place]) 12/04/2016   Vitamin D deficiency 08/19/2014   Osteopenia 08/12/2013   CAD (coronary artery disease), native coronary artery 03/22/2013   Aortic valve stenosis, mild 03/22/2013   Hyperlipemia 03/11/2013   Hypertension 03/11/2013   Allergic rhinitis 03/11/2013   IBS (irritable bowel syndrome) - with chronic recurrent abdominal pain 02/01/2012   Personal history of colonic polyps - adenoma 02/01/2012    ONSET DATE: November-December 2023  REFERRING DIAG: Cognitive impairment; Parkinsonism, unspecified Parkinsonism type; Urinary incontinence, unspecified type   THERAPY DIAG:  Unsteadiness on feet  Muscle weakness (generalized)  Rationale for Evaluation and Treatment: Rehabilitation  SUBJECTIVE:  SUBJECTIVE STATEMENT: Patient reports that her legs are bothering her today as they are twitching a lot today.  Pt accompanied by: self and significant other  PERTINENT HISTORY: Hypertension, osteoporosis, osteoarthritis, anxiety, allergies, and memory impairments  PAIN:  Are you having pain? Yes: NPRS scale: 5/10 Pain location: left leg Pain description: throbbing and tingling  PRECAUTIONS: Fall  WEIGHT BEARING RESTRICTIONS: No  FALLS: Has patient fallen in last 6 months? Yes. Number of falls 2  LIVING ENVIRONMENT: Lives with: Lives in: House/apartment Stairs:  No Has following equipment at home: [place] - 4 wheeled  PLOF: Independent  PATIENT GOALS: improved safety and balance, improved ease getting into and out of her bed  OBJECTIVE:   COGNITION: Overall cognitive status: Within functional limits for tasks assessed   SENSATION: Patient reports tingling in her left lower extremity with no dermatomal pattern. Light touch: increased sensitivity along L4-5 dermatomes  COORDINATION: Mild tremor observed  DTRs:  Patella 1 = Trace  and Achilles 1 = Trace   POSTURE: flexed trunk   LOWER EXTREMITY ROM: WFL for activities assessed  LOWER EXTREMITY MMT:    MMT Right Eval Left Eval  Hip flexion 4/5 4-/5  Hip extension    Hip abduction    Hip adduction    Hip internal rotation    Hip external rotation    Knee flexion 4+/5 4+/5  Knee extension 4/5; crepitus 4-/5; crepitus  Ankle dorsiflexion 4+/5 4+/5  Ankle plantarflexion    Ankle inversion    Ankle eversion    (Blank rows = not tested)  BED MOBILITY:  To be assessed at follow-up appointments as able  TRANSFERS: Assistive device utilized: Environmental consultant - 4 wheeled  Sit to stand: Modified independence and required UE support from arm rests Stand to sit: Modified independence and required UE support from armrest  GAIT: Gait pattern: step to pattern, decreased step length- Left, decreased stance time- Right, decreased stride length, decreased hip/knee flexion- Right, decreased hip/knee flexion- Left, decreased ankle dorsiflexion- Right, decreased ankle dorsiflexion- Left, shuffling, decreased trunk rotation, poor foot clearance- Right, and poor foot clearance- Left Assistive device utilized: Walker - 4 wheeled Level of assistance: Modified independence  FUNCTIONAL TESTS:  5 times sit to stand: 48.98 seconds with UE support from armrests Timed up and go (TUG): 26.53 seconds with Rolator  Slump Test: negative  TODAY'S TREATMENT:                                                                                                                               DATE:                                     2/23 EXERCISE LOG  Exercise Repetitions and Resistance Comments  Nustep  L3 x 16 minutes   Seated HS stretch  3 x 30 seconds each    LAQ 2# x  3 minutes   Standing gastroc stretch  4 x 30 seconds    Seated heel raise 2 minutes   Seated hip ADD isometric  2 minutes w/ 5 second hold    Blank cell = exercise not performed today   PATIENT EDUCATION: Education details: Prognosis, plan of care, and goals for therapy Person educated: Patient and Spouse Education method: Explanation Education comprehension: verbalized understanding  HOME EXERCISE PROGRAM:   GOALS: Goals reviewed with patient? Yes  SHORT TERM GOALS: Target date: 02/27/23  Patient will be independent with her initial HEP. Baseline: Goal status: INITIAL  2.  Patient will improve her 5 times sit to stand to 38 seconds or less for improved lower extremity power. Baseline:  Goal status: INITIAL  3.  Patient will improve her timed up and go to 20 seconds or less for improved safety. Baseline:  Goal status: INITIAL  LONG TERM GOALS: Target date: 03/20/23  Patient will be independent with her advanced HEP. Baseline:  Goal status: INITIAL  2.  Patient will improve her 5 times sit to stand to 30 seconds or less for improved lower extremity power. Baseline:  Goal status: INITIAL  3.  Patient will improve her timed up and go to 15 seconds or less for improved safety. Baseline:  Goal status: INITIAL  4.  Patient will be able to transfer from sitting to standing with minimal difficulty for improved independence. Baseline: Initial evaluation: Maximal difficulty Goal status: INITIAL  ASSESSMENT:  CLINICAL IMPRESSION: Patient was introduced to multiple new interventions for improved lower extremity strength and mobility. She required minimal cueing with today's new interventions for proper exercise performance.  She reported feeling better upon the conclusion of treatment and was able to demonstrate a slight improvement in gait mechanics. She continues to require skilled physical therapy to address her remaining impairments to maximize her safety and mobility.   OBJECTIVE IMPAIRMENTS: Abnormal gait, decreased activity tolerance, decreased balance, decreased mobility, difficulty walking, decreased strength, impaired sensation, postural dysfunction, and pain.   ACTIVITY LIMITATIONS: carrying, lifting, standing, squatting, sleeping, stairs, transfers, bed mobility, dressing, locomotion level, and caring for others  PARTICIPATION LIMITATIONS: meal prep, cleaning, laundry, shopping, and community activity  PERSONAL FACTORS: Past/current experiences, Time since onset of injury/illness/exacerbation, Transportation, and 3+ comorbidities: Hypertension, osteoporosis, osteoarthritis, anxiety, allergies, and memory impairments  are also affecting patient's functional outcome.   REHAB POTENTIAL: Fair    CLINICAL DECISION MAKING: Evolving/moderate complexity  EVALUATION COMPLEXITY: Moderate  PLAN:  PT FREQUENCY: 2x/week  PT DURATION: 6 weeks  PLANNED INTERVENTIONS: Therapeutic exercises, Therapeutic activity, Neuromuscular re-education, Balance training, Gait training, Patient/Family education, Self Care, Stair training, Electrical stimulation, Cryotherapy, Moist heat, Manual therapy, and Re-evaluation  PLAN FOR NEXT SESSION: NuStep, lower extremity strengthening, and balance interventions   Darlin Coco, PT 02/08/2023, 12:36 PM

## 2023-02-12 ENCOUNTER — Encounter: Payer: Self-pay | Admitting: *Deleted

## 2023-02-12 ENCOUNTER — Ambulatory Visit: Payer: Medicare PPO | Admitting: *Deleted

## 2023-02-12 DIAGNOSIS — R2681 Unsteadiness on feet: Secondary | ICD-10-CM

## 2023-02-12 DIAGNOSIS — M6281 Muscle weakness (generalized): Secondary | ICD-10-CM

## 2023-02-12 DIAGNOSIS — G20C Parkinsonism, unspecified: Secondary | ICD-10-CM | POA: Diagnosis not present

## 2023-02-12 DIAGNOSIS — R32 Unspecified urinary incontinence: Secondary | ICD-10-CM | POA: Diagnosis not present

## 2023-02-12 DIAGNOSIS — R4189 Other symptoms and signs involving cognitive functions and awareness: Secondary | ICD-10-CM | POA: Diagnosis not present

## 2023-02-12 NOTE — Therapy (Signed)
OUTPATIENT PHYSICAL THERAPY NEURO TREATMENT   Patient Name: Shelly Bennett MRN: [id number] DOB:Jun 19, 1951, 72 y.o., female Today's Date: 02/12/2023   PCP: Dettinger, Fransisca Kaufmann, MD REFERRING PROVIDER: Marcial Pacas, MD   END OF SESSION:  PT End of Session - 02/12/23 1607     Visit Number 3    Number of Visits 12    Date for PT Re-Evaluation 04/12/23    PT Start Time 1600    PT Stop Time 31    PT Time Calculation (min) 50 min              Past Medical History:  Diagnosis Date   Adenomatous colon polyp    Allergic drug reaction 06/25/2015   Allergy    Anxiety    Arthritis    At risk for falls    fallen 4 times recently    CAD (coronary artery disease)    Dr. Burt Knack follows    Carpal tunnel syndrome    Chronic headaches    Coccydynia 02/01/2012   Cystocele    Diverticulosis    External hemorrhoids    Focal nodular hyperplasia of liver    Gait abnormality    GERD (gastroesophageal reflux disease)    some   HCAP (healthcare-associated pneumonia) 06/25/2015   Heart murmur    HLD (hyperlipidemia)    HTN (hypertension)    IBS (irritable bowel syndrome)    Memory loss    Neuromuscular disorder ([place])    some neuropathy issues in the past- legs fibromyalgia in past-- past hx of steroid injections   Obesity    Osteoporosis    Otitis of left ear 02/01/2017   Pneumonia    Skin cancer    squamous cell - face   Past Surgical History:  Procedure Laterality Date   CARPAL TUNNEL RELEASE     COLONOSCOPY  12/15/2008   diverticulosis, external hemorrhoids   KNEE SURGERY  05/22/2015   right   LAPAROSCOPY     SKIN SURGERY     squamous cell - right face    Patient Active Problem List   Diagnosis Date Noted   Parkinsonism 01/24/2023   Urinary incontinence 01/24/2023   Pain in right knee 12/05/2022   Pain in left knee 10/23/2022   Bilateral primary osteoarthritis of knee 10/04/2022   Gait abnormality 03/10/2020   Gastroesophageal reflux disease 10/19/2019    Third degree uterine prolapse 10/19/2019   Spinal stenosis of lumbar region without neurogenic claudication 09/15/2019   Urinary and fecal incontinence 09/15/2019   Cystocele with rectocele 09/15/2019   Left lumbar radiculopathy 01/22/2018   Cognitive impairment 10/22/2017   Anxiety 10/22/2017   Aortic atherosclerosis ([place]) 12/04/2016   Vitamin D deficiency 08/19/2014   Osteopenia 08/12/2013   CAD (coronary artery disease), native coronary artery 03/22/2013   Aortic valve stenosis, mild 03/22/2013   Hyperlipemia 03/11/2013   Hypertension 03/11/2013   Allergic rhinitis 03/11/2013   IBS (irritable bowel syndrome) - with chronic recurrent abdominal pain 02/01/2012   Personal history of colonic polyps - adenoma 02/01/2012    ONSET DATE: November-December 2023  REFERRING DIAG: Cognitive impairment; Parkinsonism, unspecified Parkinsonism type; Urinary incontinence, unspecified type   THERAPY DIAG:  Unsteadiness on feet  Muscle weakness (generalized)  Rationale for Evaluation and Treatment: Rehabilitation  SUBJECTIVE:  SUBJECTIVE STATEMENT: Patient reports that her legs are twitching a lot today.    PERTINENT HISTORY: Hypertension, osteoporosis, osteoarthritis, anxiety, allergies, and memory impairments  PAIN:  Are you having pain? Yes: NPRS scale: 5/10 Pain location: left leg Pain description: throbbing and tingling  PRECAUTIONS: Fall  WEIGHT BEARING RESTRICTIONS: No  FALLS: Has patient fallen in last 6 months? Yes. Number of falls 2  LIVING ENVIRONMENT: Lives with: Lives in: House/apartment Stairs: No Has following equipment at home: [place] - 4 wheeled  PLOF: Independent  PATIENT GOALS: improved safety and balance, improved ease getting into and out of her bed  OBJECTIVE:    COGNITION: Overall cognitive status: Within functional limits for tasks assessed   SENSATION: Patient reports tingling in her left lower extremity with no dermatomal pattern. Light touch: increased sensitivity along L4-5 dermatomes  COORDINATION: Mild tremor observed  DTRs:  Patella 1 = Trace  and Achilles 1 = Trace   POSTURE: flexed trunk   LOWER EXTREMITY ROM: WFL for activities assessed  LOWER EXTREMITY MMT:    MMT Right Eval Left Eval  Hip flexion 4/5 4-/5  Hip extension    Hip abduction    Hip adduction    Hip internal rotation    Hip external rotation    Knee flexion 4+/5 4+/5  Knee extension 4/5; crepitus 4-/5; crepitus  Ankle dorsiflexion 4+/5 4+/5  Ankle plantarflexion    Ankle inversion    Ankle eversion    (Blank rows = not tested)  BED MOBILITY:  To be assessed at follow-up appointments as able  TRANSFERS: Assistive device utilized: Environmental consultant - 4 wheeled  Sit to stand: Modified independence and required UE support from arm rests Stand to sit: Modified independence and required UE support from armrest  GAIT: Gait pattern: step to pattern, decreased step length- Left, decreased stance time- Right, decreased stride length, decreased hip/knee flexion- Right, decreased hip/knee flexion- Left, decreased ankle dorsiflexion- Right, decreased ankle dorsiflexion- Left, shuffling, decreased trunk rotation, poor foot clearance- Right, and poor foot clearance- Left Assistive device utilized: Walker - 4 wheeled Level of assistance: Modified independence  FUNCTIONAL TESTS:  5 times sit to stand: 48.98 seconds with UE support from armrests Timed up and go (TUG): 26.53 seconds with Rolator  Slump Test: negative  TODAY'S TREATMENT:                                                                                                                              DATE:                                     2/27 EXERCISE LOG  Exercise Repetitions and Resistance Comments   Nustep  L3 x 15 minutes   Rocker board Calf stretching, PF/DF   airex NBOS UE reaches 2x20   One step holds X 20 sec each x 2 bil.   Seated  HS stretch     LAQ 2# x 3 minutes   Bil    Standing gastroc stretch     Seated heel raise    Seated hip ADD isometric with ball 2 minutes w/ 10 second hold   Seated clams  Green 3x10   Sit to stand  x 5 and hold 5 secs at top for balance    Blank cell = exercise not performed today   PATIENT EDUCATION: Education details: Prognosis, plan of care, and goals for therapy Person educated: Patient and Spouse Education method: Explanation Education comprehension: verbalized understanding  HOME EXERCISE PROGRAM:   GOALS: Goals reviewed with patient? Yes  SHORT TERM GOALS: Target date: 02/27/23  Patient will be independent with her initial HEP. Baseline: Goal status: INITIAL  2.  Patient will improve her 5 times sit to stand to 38 seconds or less for improved lower extremity power. Baseline:  Goal status: INITIAL  3.  Patient will improve her timed up and go to 20 seconds or less for improved safety. Baseline:  Goal status: INITIAL  LONG TERM GOALS: Target date: 03/20/23  Patient will be independent with her advanced HEP. Baseline:  Goal status: INITIAL  2.  Patient will improve her 5 times sit to stand to 30 seconds or less for improved lower extremity power. Baseline:  Goal status: INITIAL  3.  Patient will improve her timed up and go to 15 seconds or less for improved safety. Baseline:  Goal status: INITIAL  4.  Patient will be able to transfer from sitting to standing with minimal difficulty for improved independence. Baseline: Initial evaluation: Maximal difficulty Goal status: INITIAL  ASSESSMENT:  CLINICAL IMPRESSION:  Pt arrived today doing fairly well and reports doing good after last Rx. Rx again focused on LE therex for strengthening as well as balance act.'s in // bars. Pt frustrated still when walking due to shuffling  gait pattern.      OBJECTIVE IMPAIRMENTS: Abnormal gait, decreased activity tolerance, decreased balance, decreased mobility, difficulty walking, decreased strength, impaired sensation, postural dysfunction, and pain.   ACTIVITY LIMITATIONS: carrying, lifting, standing, squatting, sleeping, stairs, transfers, bed mobility, dressing, locomotion level, and caring for others  PARTICIPATION LIMITATIONS: meal prep, cleaning, laundry, shopping, and community activity  PERSONAL FACTORS: Past/current experiences, Time since onset of injury/illness/exacerbation, Transportation, and 3+ comorbidities: Hypertension, osteoporosis, osteoarthritis, anxiety, allergies, and memory impairments  are also affecting patient's functional outcome.   REHAB POTENTIAL: Fair    CLINICAL DECISION MAKING: Evolving/moderate complexity  EVALUATION COMPLEXITY: Moderate  PLAN:  PT FREQUENCY: 2x/week  PT DURATION: 6 weeks  PLANNED INTERVENTIONS: Therapeutic exercises, Therapeutic activity, Neuromuscular re-education, Balance training, Gait training, Patient/Family education, Self Care, Stair training, Electrical stimulation, Cryotherapy, Moist heat, Manual therapy, and Re-evaluation  PLAN FOR NEXT SESSION: NuStep, lower extremity strengthening, and balance interventions   Shelly Bennett,CHRIS, PTA 02/12/2023, 5:47 PM

## 2023-02-15 ENCOUNTER — Ambulatory Visit: Payer: Medicare PPO | Attending: Neurology

## 2023-02-15 DIAGNOSIS — M6281 Muscle weakness (generalized): Secondary | ICD-10-CM | POA: Insufficient documentation

## 2023-02-15 DIAGNOSIS — R2681 Unsteadiness on feet: Secondary | ICD-10-CM | POA: Diagnosis not present

## 2023-02-15 NOTE — Therapy (Signed)
OUTPATIENT PHYSICAL THERAPY NEURO TREATMENT   Patient Name: Shelly Bennett MRN: [id number] DOB:1951-05-02, 72 y.o., female Today's Date: 02/15/2023   PCP: Dettinger, Fransisca Kaufmann, MD REFERRING PROVIDER: Marcial Pacas, MD   END OF SESSION:  PT End of Session - 02/15/23 1119     Visit Number 4    Number of Visits 12    Date for PT Re-Evaluation 04/12/23    PT Start Time 1115    PT Stop Time [id number]    PT Time Calculation (min) 46 min    Activity Tolerance Patient tolerated treatment well    Behavior During Therapy [facility] for tasks assessed/performed              Past Medical History:  Diagnosis Date   Adenomatous colon polyp    Allergic drug reaction 06/25/2015   Allergy    Anxiety    Arthritis    At risk for falls    fallen 4 times recently    CAD (coronary artery disease)    Dr. Burt Knack follows    Carpal tunnel syndrome    Chronic headaches    Coccydynia 02/01/2012   Cystocele    Diverticulosis    External hemorrhoids    Focal nodular hyperplasia of liver    Gait abnormality    GERD (gastroesophageal reflux disease)    some   HCAP (healthcare-associated pneumonia) 06/25/2015   Heart murmur    HLD (hyperlipidemia)    HTN (hypertension)    IBS (irritable bowel syndrome)    Memory loss    Neuromuscular disorder ([place])    some neuropathy issues in the past- legs fibromyalgia in past-- past hx of steroid injections   Obesity    Osteoporosis    Otitis of left ear 02/01/2017   Pneumonia    Skin cancer    squamous cell - face   Past Surgical History:  Procedure Laterality Date   CARPAL TUNNEL RELEASE     COLONOSCOPY  12/15/2008   diverticulosis, external hemorrhoids   KNEE SURGERY  05/22/2015   right   LAPAROSCOPY     SKIN SURGERY     squamous cell - right face    Patient Active Problem List   Diagnosis Date Noted   Parkinsonism 01/24/2023   Urinary incontinence 01/24/2023   Pain in right knee 12/05/2022   Pain in left knee 10/23/2022   Bilateral primary  osteoarthritis of knee 10/04/2022   Gait abnormality 03/10/2020   Gastroesophageal reflux disease 10/19/2019   Third degree uterine prolapse 10/19/2019   Spinal stenosis of lumbar region without neurogenic claudication 09/15/2019   Urinary and fecal incontinence 09/15/2019   Cystocele with rectocele 09/15/2019   Left lumbar radiculopathy 01/22/2018   Cognitive impairment 10/22/2017   Anxiety 10/22/2017   Aortic atherosclerosis ([place]) 12/04/2016   Vitamin D deficiency 08/19/2014   Osteopenia 08/12/2013   CAD (coronary artery disease), native coronary artery 03/22/2013   Aortic valve stenosis, mild 03/22/2013   Hyperlipemia 03/11/2013   Hypertension 03/11/2013   Allergic rhinitis 03/11/2013   IBS (irritable bowel syndrome) - with chronic recurrent abdominal pain 02/01/2012   Personal history of colonic polyps - adenoma 02/01/2012    ONSET DATE: November-December 2023  REFERRING DIAG: Cognitive impairment; Parkinsonism, unspecified Parkinsonism type; Urinary incontinence, unspecified type   THERAPY DIAG:  Unsteadiness on feet  Muscle weakness (generalized)  Rationale for Evaluation and Treatment: Rehabilitation  SUBJECTIVE:  SUBJECTIVE STATEMENT: Patient reports that she feels like she walking better today.   PERTINENT HISTORY: Hypertension, osteoporosis, osteoarthritis, anxiety, allergies, and memory impairments  PAIN:  Are you having pain? Yes: NPRS scale: 5/10 Pain location: left leg Pain description: throbbing and tingling  PRECAUTIONS: Fall  WEIGHT BEARING RESTRICTIONS: No  FALLS: Has patient fallen in last 6 months? Yes. Number of falls 2  LIVING ENVIRONMENT: Lives with: Lives in: House/apartment Stairs: No Has following equipment at home: [place] - 4 wheeled  PLOF:  Independent  PATIENT GOALS: improved safety and balance, improved ease getting into and out of her bed  OBJECTIVE:   COGNITION: Overall cognitive status: Within functional limits for tasks assessed   SENSATION: Patient reports tingling in her left lower extremity with no dermatomal pattern. Light touch: increased sensitivity along L4-5 dermatomes  COORDINATION: Mild tremor observed  DTRs:  Patella 1 = Trace  and Achilles 1 = Trace   POSTURE: flexed trunk   LOWER EXTREMITY ROM: WFL for activities assessed  LOWER EXTREMITY MMT:    MMT Right Eval Left Eval  Hip flexion 4/5 4-/5  Hip extension    Hip abduction    Hip adduction    Hip internal rotation    Hip external rotation    Knee flexion 4+/5 4+/5  Knee extension 4/5; crepitus 4-/5; crepitus  Ankle dorsiflexion 4+/5 4+/5  Ankle plantarflexion    Ankle inversion    Ankle eversion    (Blank rows = not tested)  BED MOBILITY:  To be assessed at follow-up appointments as able  TRANSFERS: Assistive device utilized: Environmental consultant - 4 wheeled  Sit to stand: Modified independence and required UE support from arm rests Stand to sit: Modified independence and required UE support from armrest  GAIT: Gait pattern: step to pattern, decreased step length- Left, decreased stance time- Right, decreased stride length, decreased hip/knee flexion- Right, decreased hip/knee flexion- Left, decreased ankle dorsiflexion- Right, decreased ankle dorsiflexion- Left, shuffling, decreased trunk rotation, poor foot clearance- Right, and poor foot clearance- Left Assistive device utilized: Walker - 4 wheeled Level of assistance: Modified independence  FUNCTIONAL TESTS:  5 times sit to stand: 48.98 seconds with UE support from armrests Timed up and go (TUG): 26.53 seconds with Rolator  Slump Test: negative  TODAY'S TREATMENT:                                                                                                                               DATE:                                     3/1 EXERCISE LOG  Exercise Repetitions and Resistance Comments  Nustep  L3 x 17 minutes   Rocker board  3 minutes   Standing HS stretch  3 x 30 seconds   Marching on foam  2 minutes   LAQ 3# x  20 reps each    Seated march  3# x 15 reps each    Seated hip ADD isometric  2.5 minutes w/ 5 second hold   Seated clams Green t-band x 2 minutes   Seated hip ABD Green t-band x 2 minutes    Blank cell = exercise not performed today                                    2/27 EXERCISE LOG  Exercise Repetitions and Resistance Comments  Nustep  L3 x 15 minutes   Rocker board Calf stretching, PF/DF   airex NBOS UE reaches 2x20   One step holds X 20 sec each x 2 bil.   Seated HS stretch     LAQ 2# x 3 minutes   Bil    Standing gastroc stretch     Seated heel raise    Seated hip ADD isometric with ball 2 minutes w/ 10 second hold   Seated clams  Green 3x10   Sit to stand  x 5 and hold 5 secs at top for balance    Blank cell = exercise not performed today   PATIENT EDUCATION: Education details: Prognosis, plan of care, and goals for therapy Person educated: Patient and Spouse Education method: Explanation Education comprehension: verbalized understanding  HOME EXERCISE PROGRAM:   GOALS: Goals reviewed with patient? Yes  SHORT TERM GOALS: Target date: 02/27/23  Patient will be independent with her initial HEP. Baseline: Goal status: INITIAL  2.  Patient will improve her 5 times sit to stand to 38 seconds or less for improved lower extremity power. Baseline:  Goal status: INITIAL  3.  Patient will improve her timed up and go to 20 seconds or less for improved safety. Baseline:  Goal status: INITIAL  LONG TERM GOALS: Target date: 03/20/23  Patient will be independent with her advanced HEP. Baseline:  Goal status: INITIAL  2.  Patient will improve her 5 times sit to stand to 30 seconds or less for improved lower extremity  power. Baseline:  Goal status: INITIAL  3.  Patient will improve her timed up and go to 15 seconds or less for improved safety. Baseline:  Goal status: INITIAL  4.  Patient will be able to transfer from sitting to standing with minimal difficulty for improved independence. Baseline: Initial evaluation: Maximal difficulty Goal status: INITIAL  ASSESSMENT:  CLINICAL IMPRESSION:   Treatment focused on familiar intervention for improved lower extremity strength with moderate fatigue. She required moderate cueing with seated hip abduction to prevent contralateral lower extremity stability. Fatigue was her primary limitation with today's interventions as she required multiple seated rest breaks. She reported that her left leg was tired upon the conclusion of treatment. She continues to require skilled physical therapy to address her remaining impairments to maximize her safety and functional mobility.   OBJECTIVE IMPAIRMENTS: Abnormal gait, decreased activity tolerance, decreased balance, decreased mobility, difficulty walking, decreased strength, impaired sensation, postural dysfunction, and pain.   ACTIVITY LIMITATIONS: carrying, lifting, standing, squatting, sleeping, stairs, transfers, bed mobility, dressing, locomotion level, and caring for others  PARTICIPATION LIMITATIONS: meal prep, cleaning, laundry, shopping, and community activity  PERSONAL FACTORS: Past/current experiences, Time since onset of injury/illness/exacerbation, Transportation, and 3+ comorbidities: Hypertension, osteoporosis, osteoarthritis, anxiety, allergies, and memory impairments  are also affecting patient's functional outcome.   REHAB POTENTIAL: Fair    CLINICAL DECISION MAKING: Evolving/moderate complexity  EVALUATION  COMPLEXITY: Moderate  PLAN:  PT FREQUENCY: 2x/week  PT DURATION: 6 weeks  PLANNED INTERVENTIONS: Therapeutic exercises, Therapeutic activity, Neuromuscular re-education, Balance training,  Gait training, Patient/Family education, Self Care, Stair training, Electrical stimulation, Cryotherapy, Moist heat, Manual therapy, and Re-evaluation  PLAN FOR NEXT SESSION: NuStep, lower extremity strengthening, and balance interventions   Darlin Coco, PT 02/15/2023, 12:21 PM

## 2023-02-20 ENCOUNTER — Ambulatory Visit: Payer: Medicare PPO

## 2023-02-20 DIAGNOSIS — R2681 Unsteadiness on feet: Secondary | ICD-10-CM | POA: Diagnosis not present

## 2023-02-20 DIAGNOSIS — M6281 Muscle weakness (generalized): Secondary | ICD-10-CM | POA: Diagnosis not present

## 2023-02-20 NOTE — Therapy (Signed)
OUTPATIENT PHYSICAL THERAPY NEURO TREATMENT   Patient Name: Shelly Bennett MRN: [id number] DOB:06/22/1951, 72 y.o., female Today's Date: 02/20/2023   PCP: Dettinger, Fransisca Kaufmann, MD REFERRING PROVIDER: Marcial Pacas, MD   END OF SESSION:  PT End of Session - 02/20/23 1153     Visit Number 5    Number of Visits 12    Date for PT Re-Evaluation 04/12/23    PT Start Time 1115    PT Stop Time 1200    PT Time Calculation (min) 45 min    Activity Tolerance Patient tolerated treatment well    Behavior During Therapy [facility] for tasks assessed/performed              Past Medical History:  Diagnosis Date   Adenomatous colon polyp    Allergic drug reaction 06/25/2015   Allergy    Anxiety    Arthritis    At risk for falls    fallen 4 times recently    CAD (coronary artery disease)    Dr. Burt Knack follows    Carpal tunnel syndrome    Chronic headaches    Coccydynia 02/01/2012   Cystocele    Diverticulosis    External hemorrhoids    Focal nodular hyperplasia of liver    Gait abnormality    GERD (gastroesophageal reflux disease)    some   HCAP (healthcare-associated pneumonia) 06/25/2015   Heart murmur    HLD (hyperlipidemia)    HTN (hypertension)    IBS (irritable bowel syndrome)    Memory loss    Neuromuscular disorder ([place])    some neuropathy issues in the past- legs fibromyalgia in past-- past hx of steroid injections   Obesity    Osteoporosis    Otitis of left ear 02/01/2017   Pneumonia    Skin cancer    squamous cell - face   Past Surgical History:  Procedure Laterality Date   CARPAL TUNNEL RELEASE     COLONOSCOPY  12/15/2008   diverticulosis, external hemorrhoids   KNEE SURGERY  05/22/2015   right   LAPAROSCOPY     SKIN SURGERY     squamous cell - right face    Patient Active Problem List   Diagnosis Date Noted   Parkinsonism 01/24/2023   Urinary incontinence 01/24/2023   Pain in right knee 12/05/2022   Pain in left knee 10/23/2022   Bilateral primary  osteoarthritis of knee 10/04/2022   Gait abnormality 03/10/2020   Gastroesophageal reflux disease 10/19/2019   Third degree uterine prolapse 10/19/2019   Spinal stenosis of lumbar region without neurogenic claudication 09/15/2019   Urinary and fecal incontinence 09/15/2019   Cystocele with rectocele 09/15/2019   Left lumbar radiculopathy 01/22/2018   Cognitive impairment 10/22/2017   Anxiety 10/22/2017   Aortic atherosclerosis ([place]) 12/04/2016   Vitamin D deficiency 08/19/2014   Osteopenia 08/12/2013   CAD (coronary artery disease), native coronary artery 03/22/2013   Aortic valve stenosis, mild 03/22/2013   Hyperlipemia 03/11/2013   Hypertension 03/11/2013   Allergic rhinitis 03/11/2013   IBS (irritable bowel syndrome) - with chronic recurrent abdominal pain 02/01/2012   Personal history of colonic polyps - adenoma 02/01/2012    ONSET DATE: November-December 2023  REFERRING DIAG: Cognitive impairment; Parkinsonism, unspecified Parkinsonism type; Urinary incontinence, unspecified type   THERAPY DIAG:  Unsteadiness on feet  Muscle weakness (generalized)  Rationale for Evaluation and Treatment: Rehabilitation  SUBJECTIVE:  SUBJECTIVE STATEMENT: Patient reports that she is tired today from doing housework today.   PERTINENT HISTORY: Hypertension, osteoporosis, osteoarthritis, anxiety, allergies, and memory impairments  PAIN:  Are you having pain? Yes: NPRS scale: 0/10 Pain location: left leg Pain description: throbbing and tingling  PRECAUTIONS: Fall  WEIGHT BEARING RESTRICTIONS: No  FALLS: Has patient fallen in last 6 months? Yes. Number of falls 2  LIVING ENVIRONMENT: Lives with: Lives in: House/apartment Stairs: No Has following equipment at home: [place] - 4 wheeled  PLOF:  Independent  PATIENT GOALS: improved safety and balance, improved ease getting into and out of her bed  OBJECTIVE:   COGNITION: Overall cognitive status: Within functional limits for tasks assessed   SENSATION: Patient reports tingling in her left lower extremity with no dermatomal pattern. Light touch: increased sensitivity along L4-5 dermatomes  COORDINATION: Mild tremor observed  DTRs:  Patella 1 = Trace  and Achilles 1 = Trace   POSTURE: flexed trunk   LOWER EXTREMITY ROM: WFL for activities assessed  LOWER EXTREMITY MMT:    MMT Right Eval Left Eval  Hip flexion 4/5 4-/5  Hip extension    Hip abduction    Hip adduction    Hip internal rotation    Hip external rotation    Knee flexion 4+/5 4+/5  Knee extension 4/5; crepitus 4-/5; crepitus  Ankle dorsiflexion 4+/5 4+/5  Ankle plantarflexion    Ankle inversion    Ankle eversion    (Blank rows = not tested)  BED MOBILITY:  To be assessed at follow-up appointments as able  TRANSFERS: Assistive device utilized: Environmental consultant - 4 wheeled  Sit to stand: Modified independence and required UE support from arm rests Stand to sit: Modified independence and required UE support from armrest  GAIT: Gait pattern: step to pattern, decreased step length- Left, decreased stance time- Right, decreased stride length, decreased hip/knee flexion- Right, decreased hip/knee flexion- Left, decreased ankle dorsiflexion- Right, decreased ankle dorsiflexion- Left, shuffling, decreased trunk rotation, poor foot clearance- Right, and poor foot clearance- Left Assistive device utilized: Walker - 4 wheeled Level of assistance: Modified independence  FUNCTIONAL TESTS:  5 times sit to stand: 48.98 seconds with UE support from armrests Timed up and go (TUG): 26.53 seconds with Rolator  Slump Test: negative  TODAY'S TREATMENT:                                                                                                                               DATE:                                     3/6 EXERCISE LOG  Exercise Repetitions and Resistance Comments  Nustep  L4 x 16 minutes    LAQ 4# x 3 minutes   Seated marching 4# x 2 minutes    Standing donkey kicks 15 reps each   Standing heel /  toe raises 2 minutes   Toe taps on step  8" step x 15 reps each    Seated hip ADD isometric  2 minutes w/ 5 second hold    Seated clams  Green t-band x 3 minutes    Blank cell = exercise not performed today                                    3/1 EXERCISE LOG  Exercise Repetitions and Resistance Comments  Nustep  L3 x 17 minutes   Rocker board  3 minutes   Standing HS stretch  3 x 30 seconds   Marching on foam  2 minutes   LAQ 3# x 20 reps each    Seated march  3# x 15 reps each    Seated hip ADD isometric  2.5 minutes w/ 5 second hold   Seated clams Green t-band x 2 minutes   Seated hip ABD Green t-band x 2 minutes    Blank cell = exercise not performed today                                    2/27 EXERCISE LOG  Exercise Repetitions and Resistance Comments  Nustep  L3 x 15 minutes   Rocker board Calf stretching, PF/DF   airex NBOS UE reaches 2x20   One step holds X 20 sec each x 2 bil.   Seated HS stretch     LAQ 2# x 3 minutes   Bil    Standing gastroc stretch     Seated heel raise    Seated hip ADD isometric with ball 2 minutes w/ 10 second hold   Seated clams  Green 3x10   Sit to stand  x 5 and hold 5 secs at top for balance    Blank cell = exercise not performed today   PATIENT EDUCATION: Education details: Prognosis, plan of care, and goals for therapy Person educated: Patient and Spouse Education method: Explanation Education comprehension: verbalized understanding  HOME EXERCISE PROGRAM:   GOALS: Goals reviewed with patient? Yes  SHORT TERM GOALS: Target date: 02/27/23  Patient will be independent with her initial HEP. Baseline: Goal status: INITIAL  2.  Patient will improve her 5 times sit to stand to 38  seconds or less for improved lower extremity power. Baseline:  Goal status: INITIAL  3.  Patient will improve her timed up and go to 20 seconds or less for improved safety. Baseline:  Goal status: INITIAL  LONG TERM GOALS: Target date: 03/20/23  Patient will be independent with her advanced HEP. Baseline:  Goal status: INITIAL  2.  Patient will improve her 5 times sit to stand to 30 seconds or less for improved lower extremity power. Baseline:  Goal status: INITIAL  3.  Patient will improve her timed up and go to 15 seconds or less for improved safety. Baseline:  Goal status: INITIAL  4.  Patient will be able to transfer from sitting to standing with minimal difficulty for improved independence. Baseline: Initial evaluation: Maximal difficulty Goal status: INITIAL  ASSESSMENT:  CLINICAL IMPRESSION:   Patient was introduced to standing donkey kicks with moderate difficulty. She required minimal cueing with this intervention for proper exercise performance to facilitate increased posterior chain engagement. She required multiple seated rest breaks throughout treatment due to increased fatigue.  She reported feeling good upon the conclusion of treatment. She continues to require skilled physical therapy to address her remaining impairments to maximize her safety and functional mobility.   OBJECTIVE IMPAIRMENTS: Abnormal gait, decreased activity tolerance, decreased balance, decreased mobility, difficulty walking, decreased strength, impaired sensation, postural dysfunction, and pain.   ACTIVITY LIMITATIONS: carrying, lifting, standing, squatting, sleeping, stairs, transfers, bed mobility, dressing, locomotion level, and caring for others  PARTICIPATION LIMITATIONS: meal prep, cleaning, laundry, shopping, and community activity  PERSONAL FACTORS: Past/current experiences, Time since onset of injury/illness/exacerbation, Transportation, and 3+ comorbidities: Hypertension, osteoporosis,  osteoarthritis, anxiety, allergies, and memory impairments  are also affecting patient's functional outcome.   REHAB POTENTIAL: Fair    CLINICAL DECISION MAKING: Evolving/moderate complexity  EVALUATION COMPLEXITY: Moderate  PLAN:  PT FREQUENCY: 2x/week  PT DURATION: 6 weeks  PLANNED INTERVENTIONS: Therapeutic exercises, Therapeutic activity, Neuromuscular re-education, Balance training, Gait training, Patient/Family education, Self Care, Stair training, Electrical stimulation, Cryotherapy, Moist heat, Manual therapy, and Re-evaluation  PLAN FOR NEXT SESSION: NuStep, lower extremity strengthening, and balance interventions   Darlin Coco, PT 02/20/2023, 12:34 PM

## 2023-02-22 ENCOUNTER — Ambulatory Visit: Payer: Medicare PPO

## 2023-02-22 DIAGNOSIS — R2681 Unsteadiness on feet: Secondary | ICD-10-CM

## 2023-02-22 DIAGNOSIS — M6281 Muscle weakness (generalized): Secondary | ICD-10-CM

## 2023-02-22 NOTE — Therapy (Signed)
OUTPATIENT PHYSICAL THERAPY NEURO TREATMENT   Patient Name: Shelly Bennett MRN: [id number] DOB:18-Sep-1951, 72 y.o., female Today's Date: 02/22/2023   PCP: Dettinger, Fransisca Kaufmann, MD REFERRING PROVIDER: Marcial Pacas, MD   END OF SESSION:  PT End of Session - 02/22/23 1228     Visit Number 6    Number of Visits 12    Date for PT Re-Evaluation 04/12/23    PT Start Time 1115    PT Stop Time 1200    PT Time Calculation (min) 45 min    Activity Tolerance Patient tolerated treatment well    Behavior During Therapy [facility] for tasks assessed/performed               Past Medical History:  Diagnosis Date   Adenomatous colon polyp    Allergic drug reaction 06/25/2015   Allergy    Anxiety    Arthritis    At risk for falls    fallen 4 times recently    CAD (coronary artery disease)    Dr. Burt Knack follows    Carpal tunnel syndrome    Chronic headaches    Coccydynia 02/01/2012   Cystocele    Diverticulosis    External hemorrhoids    Focal nodular hyperplasia of liver    Gait abnormality    GERD (gastroesophageal reflux disease)    some   HCAP (healthcare-associated pneumonia) 06/25/2015   Heart murmur    HLD (hyperlipidemia)    HTN (hypertension)    IBS (irritable bowel syndrome)    Memory loss    Neuromuscular disorder ([place])    some neuropathy issues in the past- legs fibromyalgia in past-- past hx of steroid injections   Obesity    Osteoporosis    Otitis of left ear 02/01/2017   Pneumonia    Skin cancer    squamous cell - face   Past Surgical History:  Procedure Laterality Date   CARPAL TUNNEL RELEASE     COLONOSCOPY  12/15/2008   diverticulosis, external hemorrhoids   KNEE SURGERY  05/22/2015   right   LAPAROSCOPY     SKIN SURGERY     squamous cell - right face    Patient Active Problem List   Diagnosis Date Noted   Parkinsonism 01/24/2023   Urinary incontinence 01/24/2023   Pain in right knee 12/05/2022   Pain in left knee 10/23/2022   Bilateral primary  osteoarthritis of knee 10/04/2022   Gait abnormality 03/10/2020   Gastroesophageal reflux disease 10/19/2019   Third degree uterine prolapse 10/19/2019   Spinal stenosis of lumbar region without neurogenic claudication 09/15/2019   Urinary and fecal incontinence 09/15/2019   Cystocele with rectocele 09/15/2019   Left lumbar radiculopathy 01/22/2018   Cognitive impairment 10/22/2017   Anxiety 10/22/2017   Aortic atherosclerosis ([place]) 12/04/2016   Vitamin D deficiency 08/19/2014   Osteopenia 08/12/2013   CAD (coronary artery disease), native coronary artery 03/22/2013   Aortic valve stenosis, mild 03/22/2013   Hyperlipemia 03/11/2013   Hypertension 03/11/2013   Allergic rhinitis 03/11/2013   IBS (irritable bowel syndrome) - with chronic recurrent abdominal pain 02/01/2012   Personal history of colonic polyps - adenoma 02/01/2012    ONSET DATE: November-December 2023  REFERRING DIAG: Cognitive impairment; Parkinsonism, unspecified Parkinsonism type; Urinary incontinence, unspecified type   THERAPY DIAG:  Unsteadiness on feet  Muscle weakness (generalized)  Rationale for Evaluation and Treatment: Rehabilitation  SUBJECTIVE:  SUBJECTIVE STATEMENT: Patient reports that she feels good today.   PERTINENT HISTORY: Hypertension, osteoporosis, osteoarthritis, anxiety, allergies, and memory impairments  PAIN:  Are you having pain? Yes: NPRS scale: 0/10 Pain location: left leg Pain description: throbbing and tingling  PRECAUTIONS: Fall  WEIGHT BEARING RESTRICTIONS: No  FALLS: Has patient fallen in last 6 months? Yes. Number of falls 2  LIVING ENVIRONMENT: Lives with: Lives in: House/apartment Stairs: No Has following equipment at home: [place] - 4 wheeled  PLOF: Independent  PATIENT  GOALS: improved safety and balance, improved ease getting into and out of her bed  OBJECTIVE:   COGNITION: Overall cognitive status: Within functional limits for tasks assessed   SENSATION: Patient reports tingling in her left lower extremity with no dermatomal pattern. Light touch: increased sensitivity along L4-5 dermatomes  COORDINATION: Mild tremor observed  DTRs:  Patella 1 = Trace  and Achilles 1 = Trace   POSTURE: flexed trunk   LOWER EXTREMITY ROM: WFL for activities assessed  LOWER EXTREMITY MMT:    MMT Right Eval Left Eval  Hip flexion 4/5 4-/5  Hip extension    Hip abduction    Hip adduction    Hip internal rotation    Hip external rotation    Knee flexion 4+/5 4+/5  Knee extension 4/5; crepitus 4-/5; crepitus  Ankle dorsiflexion 4+/5 4+/5  Ankle plantarflexion    Ankle inversion    Ankle eversion    (Blank rows = not tested)  BED MOBILITY:  To be assessed at follow-up appointments as able  TRANSFERS: Assistive device utilized: Environmental consultant - 4 wheeled  Sit to stand: Modified independence and required UE support from arm rests Stand to sit: Modified independence and required UE support from armrest  GAIT: Gait pattern: step to pattern, decreased step length- Left, decreased stance time- Right, decreased stride length, decreased hip/knee flexion- Right, decreased hip/knee flexion- Left, decreased ankle dorsiflexion- Right, decreased ankle dorsiflexion- Left, shuffling, decreased trunk rotation, poor foot clearance- Right, and poor foot clearance- Left Assistive device utilized: Walker - 4 wheeled Level of assistance: Modified independence  FUNCTIONAL TESTS:  5 times sit to stand: 48.98 seconds with UE support from armrests Timed up and go (TUG): 26.53 seconds with Rolator  Slump Test: negative  TODAY'S TREATMENT:                                                                                                                              DATE:                                      3/8 EXERCISE LOG  Exercise Repetitions and Resistance Comments  Nustep  L4 x 17 minutes   LAQ 4# x 3 minutes   Sit to stand 13 reps    Seated clams Red t-band x 3 minutes   Seated hip ADD isometric  3 minutes  w/ 5 second hold    Side stepping  2 minutes BUE support from parallel bars  Standing marching 2 minutes BUE support from parallel bars  Unsupported static stance 1 minute  Intermittent UE support   Blank cell = exercise not performed today                                    3/6 EXERCISE LOG  Exercise Repetitions and Resistance Comments  Nustep  L4 x 16 minutes    LAQ 4# x 3 minutes   Seated marching 4# x 2 minutes    Standing donkey kicks 15 reps each   Standing heel /toe raises 2 minutes   Toe taps on step  8" step x 15 reps each    Seated hip ADD isometric  2 minutes w/ 5 second hold    Seated clams  Green t-band x 3 minutes    Blank cell = exercise not performed today                                    3/1 EXERCISE LOG  Exercise Repetitions and Resistance Comments  Nustep  L3 x 17 minutes   Rocker board  3 minutes   Standing HS stretch  3 x 30 seconds   Marching on foam  2 minutes   LAQ 3# x 20 reps each    Seated march  3# x 15 reps each    Seated hip ADD isometric  2.5 minutes w/ 5 second hold   Seated clams Green t-band x 2 minutes   Seated hip ABD Green t-band x 2 minutes    Blank cell = exercise not performed today   PATIENT EDUCATION: Education details: Prognosis, plan of care, and goals for therapy Person educated: Patient and Spouse Education method: Explanation Education comprehension: verbalized understanding  HOME EXERCISE PROGRAM:   GOALS: Goals reviewed with patient? Yes  SHORT TERM GOALS: Target date: 02/27/23  Patient will be independent with her initial HEP. Baseline: Goal status: INITIAL  2.  Patient will improve her 5 times sit to stand to 38 seconds or less for improved lower extremity power. Baseline:   Goal status: INITIAL  3.  Patient will improve her timed up and go to 20 seconds or less for improved safety. Baseline:  Goal status: INITIAL  LONG TERM GOALS: Target date: 03/20/23  Patient will be independent with her advanced HEP. Baseline:  Goal status: INITIAL  2.  Patient will improve her 5 times sit to stand to 30 seconds or less for improved lower extremity power. Baseline:  Goal status: INITIAL  3.  Patient will improve her timed up and go to 15 seconds or less for improved safety. Baseline:  Goal status: INITIAL  4.  Patient will be able to transfer from sitting to standing with minimal difficulty for improved independence. Baseline: Initial evaluation: Maximal difficulty Goal status: INITIAL  ASSESSMENT:  CLINICAL IMPRESSION:   Patient was introduced to multiple new standing interventions for her improved lower extremity stability and function with her daily activities. She required minimal cueing with sidestepping to facilitate an increased step length and foot clearance for safety. She is being a mild increase in lower extremity fatigue with today's interventions which required a brief seated rest break. She reported feeling good upon conclusion of treatment. Recommend that she  continue with skilled physical therapy to address her remaining impairments to maximize her safety and functional mobility.  OBJECTIVE IMPAIRMENTS: Abnormal gait, decreased activity tolerance, decreased balance, decreased mobility, difficulty walking, decreased strength, impaired sensation, postural dysfunction, and pain.   ACTIVITY LIMITATIONS: carrying, lifting, standing, squatting, sleeping, stairs, transfers, bed mobility, dressing, locomotion level, and caring for others  PARTICIPATION LIMITATIONS: meal prep, cleaning, laundry, shopping, and community activity  PERSONAL FACTORS: Past/current experiences, Time since onset of injury/illness/exacerbation, Transportation, and 3+ comorbidities:  Hypertension, osteoporosis, osteoarthritis, anxiety, allergies, and memory impairments  are also affecting patient's functional outcome.   REHAB POTENTIAL: Fair    CLINICAL DECISION MAKING: Evolving/moderate complexity  EVALUATION COMPLEXITY: Moderate  PLAN:  PT FREQUENCY: 2x/week  PT DURATION: 6 weeks  PLANNED INTERVENTIONS: Therapeutic exercises, Therapeutic activity, Neuromuscular re-education, Balance training, Gait training, Patient/Family education, Self Care, Stair training, Electrical stimulation, Cryotherapy, Moist heat, Manual therapy, and Re-evaluation  PLAN FOR NEXT SESSION: NuStep, lower extremity strengthening, and balance interventions   Darlin Coco, PT 02/22/2023, 12:36 PM

## 2023-02-23 ENCOUNTER — Other Ambulatory Visit: Payer: Self-pay | Admitting: Family Medicine

## 2023-02-27 ENCOUNTER — Ambulatory Visit: Payer: Medicare PPO

## 2023-02-27 DIAGNOSIS — R2681 Unsteadiness on feet: Secondary | ICD-10-CM

## 2023-02-27 DIAGNOSIS — M6281 Muscle weakness (generalized): Secondary | ICD-10-CM | POA: Diagnosis not present

## 2023-02-27 NOTE — Therapy (Signed)
OUTPATIENT PHYSICAL THERAPY NEURO TREATMENT   Patient Name: Shelly Bennett MRN: [id number] DOB:07-03-1951, 72 y.o., female Today's Date: 02/27/2023   PCP: Dettinger, Fransisca Kaufmann, MD REFERRING PROVIDER: Marcial Pacas, MD   END OF SESSION:  PT End of Session - 02/27/23 0957     Visit Number 7    Number of Visits 12    Date for PT Re-Evaluation 04/12/23    PT Start Time 0945    PT Stop Time 1030    PT Time Calculation (min) 45 min    Activity Tolerance Patient tolerated treatment well    Behavior During Therapy [facility] for tasks assessed/performed                Past Medical History:  Diagnosis Date   Adenomatous colon polyp    Allergic drug reaction 06/25/2015   Allergy    Anxiety    Arthritis    At risk for falls    fallen 4 times recently    CAD (coronary artery disease)    Dr. Burt Knack follows    Carpal tunnel syndrome    Chronic headaches    Coccydynia 02/01/2012   Cystocele    Diverticulosis    External hemorrhoids    Focal nodular hyperplasia of liver    Gait abnormality    GERD (gastroesophageal reflux disease)    some   HCAP (healthcare-associated pneumonia) 06/25/2015   Heart murmur    HLD (hyperlipidemia)    HTN (hypertension)    IBS (irritable bowel syndrome)    Memory loss    Neuromuscular disorder ([place])    some neuropathy issues in the past- legs fibromyalgia in past-- past hx of steroid injections   Obesity    Osteoporosis    Otitis of left ear 02/01/2017   Pneumonia    Skin cancer    squamous cell - face   Past Surgical History:  Procedure Laterality Date   CARPAL TUNNEL RELEASE     COLONOSCOPY  12/15/2008   diverticulosis, external hemorrhoids   KNEE SURGERY  05/22/2015   right   LAPAROSCOPY     SKIN SURGERY     squamous cell - right face    Patient Active Problem List   Diagnosis Date Noted   Parkinsonism 01/24/2023   Urinary incontinence 01/24/2023   Pain in right knee 12/05/2022   Pain in left knee 10/23/2022   Bilateral  primary osteoarthritis of knee 10/04/2022   Gait abnormality 03/10/2020   Gastroesophageal reflux disease 10/19/2019   Third degree uterine prolapse 10/19/2019   Spinal stenosis of lumbar region without neurogenic claudication 09/15/2019   Urinary and fecal incontinence 09/15/2019   Cystocele with rectocele 09/15/2019   Left lumbar radiculopathy 01/22/2018   Cognitive impairment 10/22/2017   Anxiety 10/22/2017   Aortic atherosclerosis ([place]) 12/04/2016   Vitamin D deficiency 08/19/2014   Osteopenia 08/12/2013   CAD (coronary artery disease), native coronary artery 03/22/2013   Aortic valve stenosis, mild 03/22/2013   Hyperlipemia 03/11/2013   Hypertension 03/11/2013   Allergic rhinitis 03/11/2013   IBS (irritable bowel syndrome) - with chronic recurrent abdominal pain 02/01/2012   Personal history of colonic polyps - adenoma 02/01/2012    ONSET DATE: November-December 2023  REFERRING DIAG: Cognitive impairment; Parkinsonism, unspecified Parkinsonism type; Urinary incontinence, unspecified type   THERAPY DIAG:  Unsteadiness on feet  Muscle weakness (generalized)  Rationale for Evaluation and Treatment: Rehabilitation  SUBJECTIVE:  SUBJECTIVE STATEMENT: Patient reports that she feels good today.   PERTINENT HISTORY: Hypertension, osteoporosis, osteoarthritis, anxiety, allergies, and memory impairments  PAIN:  Are you having pain? Yes: NPRS scale: 0/10 Pain location: left leg Pain description: throbbing and tingling  PRECAUTIONS: Fall  WEIGHT BEARING RESTRICTIONS: No  FALLS: Has patient fallen in last 6 months? Yes. Number of falls 2  LIVING ENVIRONMENT: Lives with: Lives in: House/apartment Stairs: No Has following equipment at home: [place] - 4 wheeled  PLOF:  Independent  PATIENT GOALS: improved safety and balance, improved ease getting into and out of her bed  OBJECTIVE:   COGNITION: Overall cognitive status: Within functional limits for tasks assessed   SENSATION: Patient reports tingling in her left lower extremity with no dermatomal pattern. Light touch: increased sensitivity along L4-5 dermatomes  COORDINATION: Mild tremor observed  DTRs:  Patella 1 = Trace  and Achilles 1 = Trace   POSTURE: flexed trunk   LOWER EXTREMITY ROM: WFL for activities assessed  LOWER EXTREMITY MMT:    MMT Right Eval Left Eval  Hip flexion 4/5 4-/5  Hip extension    Hip abduction    Hip adduction    Hip internal rotation    Hip external rotation    Knee flexion 4+/5 4+/5  Knee extension 4/5; crepitus 4-/5; crepitus  Ankle dorsiflexion 4+/5 4+/5  Ankle plantarflexion    Ankle inversion    Ankle eversion    (Blank rows = not tested)  BED MOBILITY:  To be assessed at follow-up appointments as able  TRANSFERS: Assistive device utilized: Environmental consultant - 4 wheeled  Sit to stand: Modified independence and required UE support from arm rests Stand to sit: Modified independence and required UE support from armrest  GAIT: Gait pattern: step to pattern, decreased step length- Left, decreased stance time- Right, decreased stride length, decreased hip/knee flexion- Right, decreased hip/knee flexion- Left, decreased ankle dorsiflexion- Right, decreased ankle dorsiflexion- Left, shuffling, decreased trunk rotation, poor foot clearance- Right, and poor foot clearance- Left Assistive device utilized: Walker - 4 wheeled Level of assistance: Modified independence  FUNCTIONAL TESTS:  5 times sit to stand: 48.98 seconds with UE support from armrests Timed up and go (TUG): 26.53 seconds with Rolator  Slump Test: negative  TODAY'S TREATMENT:                                                                                                                               DATE:                                     3/13 EXERCISE LOG  Exercise Repetitions and Resistance Comments  Nustep  16 minutes   LAQ 5# x 3 minutes   Seated HS curl  Green t-band x 2 x 1 minute each    Seated clams Green t-band x 3 minutes   Standing HS  stretch 3 x 30 seconds   Toe taps on step  8" step x 3 minutes   Standing heel/toe raise  2 minutes     Blank cell = exercise not performed today                                    3/8 EXERCISE LOG  Exercise Repetitions and Resistance Comments  Nustep  L4 x 17 minutes   LAQ 4# x 3 minutes   Sit to stand 13 reps    Seated clams Red t-band x 3 minutes   Seated hip ADD isometric  3 minutes w/ 5 second hold    Side stepping  2 minutes BUE support from parallel bars  Standing marching 2 minutes BUE support from parallel bars  Unsupported static stance 1 minute  Intermittent UE support   Blank cell = exercise not performed today                                    3/6 EXERCISE LOG  Exercise Repetitions and Resistance Comments  Nustep  L4 x 16 minutes    LAQ 4# x 3 minutes   Seated marching 4# x 2 minutes    Standing donkey kicks 15 reps each   Standing heel /toe raises 2 minutes   Toe taps on step  8" step x 15 reps each    Seated hip ADD isometric  2 minutes w/ 5 second hold    Seated clams  Green t-band x 3 minutes    Blank cell = exercise not performed today    PATIENT EDUCATION: Education details: Prognosis, plan of care, and goals for therapy Person educated: Patient and Spouse Education method: Explanation Education comprehension: verbalized understanding  HOME EXERCISE PROGRAM:   GOALS: Goals reviewed with patient? Yes  SHORT TERM GOALS: Target date: 02/27/23  Patient will be independent with her initial HEP. Baseline: Goal status: INITIAL  2.  Patient will improve her 5 times sit to stand to 38 seconds or less for improved lower extremity power. Baseline:  Goal status: INITIAL  3.  Patient will improve  her timed up and go to 20 seconds or less for improved safety. Baseline:  Goal status: INITIAL  LONG TERM GOALS: Target date: 03/20/23  Patient will be independent with her advanced HEP. Baseline:  Goal status: INITIAL  2.  Patient will improve her 5 times sit to stand to 30 seconds or less for improved lower extremity power. Baseline:  Goal status: INITIAL  3.  Patient will improve her timed up and go to 15 seconds or less for improved safety. Baseline:  Goal status: INITIAL  4.  Patient will be able to transfer from sitting to standing with minimal difficulty for improved independence. Baseline: Initial evaluation: Maximal difficulty Goal status: INITIAL  ASSESSMENT:  CLINICAL IMPRESSION:   Patient was introduced to standing heel raises and toe taps onto an eight inch step with moderate difficulty and fatigue. She required minimal cueing with toe taps onto an eight inch step to facilitate improved hip flexion to promote increased foot clearance. She required brief rest breaks between today's interventions due to fatigue. She reported feeling tired upon conclusion of treatment.  Recommend that she continue with skilled physical therapy to address her remaining impairments to maximize her safety and functional mobility.  OBJECTIVE IMPAIRMENTS:  Abnormal gait, decreased activity tolerance, decreased balance, decreased mobility, difficulty walking, decreased strength, impaired sensation, postural dysfunction, and pain.   ACTIVITY LIMITATIONS: carrying, lifting, standing, squatting, sleeping, stairs, transfers, bed mobility, dressing, locomotion level, and caring for others  PARTICIPATION LIMITATIONS: meal prep, cleaning, laundry, shopping, and community activity  PERSONAL FACTORS: Past/current experiences, Time since onset of injury/illness/exacerbation, Transportation, and 3+ comorbidities: Hypertension, osteoporosis, osteoarthritis, anxiety, allergies, and memory impairments  are also  affecting patient's functional outcome.   REHAB POTENTIAL: Fair    CLINICAL DECISION MAKING: Evolving/moderate complexity  EVALUATION COMPLEXITY: Moderate  PLAN:  PT FREQUENCY: 2x/week  PT DURATION: 6 weeks  PLANNED INTERVENTIONS: Therapeutic exercises, Therapeutic activity, Neuromuscular re-education, Balance training, Gait training, Patient/Family education, Self Care, Stair training, Electrical stimulation, Cryotherapy, Moist heat, Manual therapy, and Re-evaluation  PLAN FOR NEXT SESSION: NuStep, lower extremity strengthening, and balance interventions   Darlin Coco, PT 02/27/2023, 2:44 PM

## 2023-03-01 ENCOUNTER — Encounter: Payer: Self-pay | Admitting: Physical Therapy

## 2023-03-01 ENCOUNTER — Ambulatory Visit: Payer: Medicare PPO | Admitting: Physical Therapy

## 2023-03-01 DIAGNOSIS — M6281 Muscle weakness (generalized): Secondary | ICD-10-CM | POA: Diagnosis not present

## 2023-03-01 DIAGNOSIS — R2681 Unsteadiness on feet: Secondary | ICD-10-CM

## 2023-03-01 NOTE — Therapy (Signed)
OUTPATIENT PHYSICAL THERAPY NEURO TREATMENT   Patient Name: Shelly Bennett MRN: [id number] DOB:December 12, 1951, 72 y.o., female Today's Date: 03/01/2023  PCP: Dettinger, Fransisca Kaufmann, MD REFERRING PROVIDER: Marcial Pacas, MD   END OF SESSION:  PT End of Session - 03/01/23 0952     Visit Number 8    Number of Visits 12    Date for PT Re-Evaluation 04/12/23    PT Start Time 0946    PT Stop Time 1034    PT Time Calculation (min) 48 min    Activity Tolerance Patient tolerated treatment well    Behavior During Therapy [facility] for tasks assessed/performed            Past Medical History:  Diagnosis Date   Adenomatous colon polyp    Allergic drug reaction 06/25/2015   Allergy    Anxiety    Arthritis    At risk for falls    fallen 4 times recently    CAD (coronary artery disease)    Dr. Burt Knack follows    Carpal tunnel syndrome    Chronic headaches    Coccydynia 02/01/2012   Cystocele    Diverticulosis    External hemorrhoids    Focal nodular hyperplasia of liver    Gait abnormality    GERD (gastroesophageal reflux disease)    some   HCAP (healthcare-associated pneumonia) 06/25/2015   Heart murmur    HLD (hyperlipidemia)    HTN (hypertension)    IBS (irritable bowel syndrome)    Memory loss    Neuromuscular disorder ([place])    some neuropathy issues in the past- legs fibromyalgia in past-- past hx of steroid injections   Obesity    Osteoporosis    Otitis of left ear 02/01/2017   Pneumonia    Skin cancer    squamous cell - face   Past Surgical History:  Procedure Laterality Date   CARPAL TUNNEL RELEASE     COLONOSCOPY  12/15/2008   diverticulosis, external hemorrhoids   KNEE SURGERY  05/22/2015   right   LAPAROSCOPY     SKIN SURGERY     squamous cell - right face    Patient Active Problem List   Diagnosis Date Noted   Parkinsonism 01/24/2023   Urinary incontinence 01/24/2023   Pain in right knee 12/05/2022   Pain in left knee 10/23/2022   Bilateral primary  osteoarthritis of knee 10/04/2022   Gait abnormality 03/10/2020   Gastroesophageal reflux disease 10/19/2019   Third degree uterine prolapse 10/19/2019   Spinal stenosis of lumbar region without neurogenic claudication 09/15/2019   Urinary and fecal incontinence 09/15/2019   Cystocele with rectocele 09/15/2019   Left lumbar radiculopathy 01/22/2018   Cognitive impairment 10/22/2017   Anxiety 10/22/2017   Aortic atherosclerosis ([place]) 12/04/2016   Vitamin D deficiency 08/19/2014   Osteopenia 08/12/2013   CAD (coronary artery disease), native coronary artery 03/22/2013   Aortic valve stenosis, mild 03/22/2013   Hyperlipemia 03/11/2013   Hypertension 03/11/2013   Allergic rhinitis 03/11/2013   IBS (irritable bowel syndrome) - with chronic recurrent abdominal pain 02/01/2012   Personal history of colonic polyps - adenoma 02/01/2012   ONSET DATE: November-December 2023  REFERRING DIAG: Cognitive impairment; Parkinsonism, unspecified Parkinsonism type; Urinary incontinence, unspecified type   THERAPY DIAG:  Unsteadiness on feet  Muscle weakness (generalized)  Rationale for Evaluation and Treatment: Rehabilitation  SUBJECTIVE:  SUBJECTIVE STATEMENT: Patient reports that she feels good today but has some L hip discomfort after more activity than normal.  PERTINENT HISTORY: Hypertension, osteoporosis, osteoarthritis, anxiety, allergies, and memory impairments  PAIN:  Are you having pain? Yes: NPRS scale: 6/10 Pain location: left hip Pain description: throbbing and tingling  PRECAUTIONS: Fall  WEIGHT BEARING RESTRICTIONS: No  FALLS: Has patient fallen in last 6 months? Yes. Number of falls 2  PATIENT GOALS: improved safety and balance, improved ease getting into and out of her bed  OBJECTIVE:     SENSATION: Patient reports tingling in her left lower extremity with no dermatomal pattern. Light touch: increased sensitivity along L4-5 dermatomes  COORDINATION: Mild tremor observed  DTRs:  Patella 1 = Trace  and Achilles 1 = Trace   POSTURE: flexed trunk   LOWER EXTREMITY MMT:    MMT Right Eval Left Eval  Hip flexion 4/5 4-/5  Hip extension    Hip abduction    Hip adduction    Hip internal rotation    Hip external rotation    Knee flexion 4+/5 4+/5  Knee extension 4/5; crepitus 4-/5; crepitus  Ankle dorsiflexion 4+/5 4+/5  Ankle plantarflexion    Ankle inversion    Ankle eversion    (Blank rows = not tested)  BED MOBILITY:  To be assessed at follow-up appointments as able  TRANSFERS: Assistive device utilized: Environmental consultant - 4 wheeled  Sit to stand: Modified independence and required UE support from arm rests Stand to sit: Modified independence and required UE support from armrest  GAIT: Gait pattern: step to pattern, decreased step length- Left, decreased stance time- Right, decreased stride length, decreased hip/knee flexion- Right, decreased hip/knee flexion- Left, decreased ankle dorsiflexion- Right, decreased ankle dorsiflexion- Left, shuffling, decreased trunk rotation, poor foot clearance- Right, and poor foot clearance- Left Assistive device utilized: Walker - 4 wheeled Level of assistance: Modified independence  FUNCTIONAL TESTS:  5 times sit to stand: 48.98 seconds with UE support from armrests Timed up and go (TUG): 26.53 seconds with Rolator  Slump Test: negative  TODAY'S TREATMENT:                                                                                                                              DATE:                             3/15   EXERCISE LOG  Exercise Repetitions and Resistance Comments  Nustep L3 x15 min   Hip clam Green theraband x20 reps   HS curl  Green theraband x20 reps each   Ball squeeze X3 min   LAQ 5# x25 reps each    Seated marching 5# x20 reps each   Sit to stands X5 reps in 46 seconds    Blank cell = exercise not performed today  3/13 EXERCISE LOG  Exercise Repetitions and Resistance Comments  Nustep  16 minutes   LAQ 5# x 3 minutes   Seated HS curl  Green t-band x 2 x 1 minute each    Seated clams Green t-band x 3 minutes   Standing HS stretch 3 x 30 seconds   Toe taps on step  8" step x 3 minutes   Standing heel/toe raise  2 minutes     Blank cell = exercise not performed today   PATIENT EDUCATION: Education details: HEP and red theraband Person educated: Patient Education method: Explanation, demonstration and handout Education comprehension: verbalized understanding  HOME EXERCISE PROGRAM: [id number]  GOALS: Goals reviewed with patient? Yes  SHORT TERM GOALS: Target date: 02/27/23  Patient will be independent with her initial HEP. Baseline: Goal status: On-going  2.  Patient will improve her 5 times sit to stand to 38 seconds or less for improved lower extremity power. Baseline:  Goal status: On-going  3.  Patient will improve her timed up and go to 20 seconds or less for improved safety. Baseline:  Goal status: On-going  LONG TERM GOALS: Target date: 03/20/23  Patient will be independent with her advanced HEP. Baseline:  Goal status: On-going  2.  Patient will improve her 5 times sit to stand to 30 seconds or less for improved lower extremity power. Baseline:  Goal status: on-going  3.  Patient will improve her timed up and go to 15 seconds or less for improved safety. Baseline:  Goal status: On-going  4.  Patient will be able to transfer from sitting to standing with minimal difficulty for improved independence. Baseline: Initial evaluation: Maximal difficulty Goal status: On-going  ASSESSMENT:  CLINICAL IMPRESSION:   Patient presented in clinic with reports of greater L hip pain as she was more active yesterday than normal.  Patient and her spouse along with her children have modified their home and activity appropriately for patient's independence such as getting from the house to the car and getting showers. Patient able to tolerate therex fairly well although with fatigue. Fatigue was accelerated by testing of sit to stands which were completed at much slower but guarded mobility. Patient was provided new HEP and red theraband with increased demo and VC instruction for technique and parameters.  OBJECTIVE IMPAIRMENTS: Abnormal gait, decreased activity tolerance, decreased balance, decreased mobility, difficulty walking, decreased strength, impaired sensation, postural dysfunction, and pain.   ACTIVITY LIMITATIONS: carrying, lifting, standing, squatting, sleeping, stairs, transfers, bed mobility, dressing, locomotion level, and caring for others  PARTICIPATION LIMITATIONS: meal prep, cleaning, laundry, shopping, and community activity  PERSONAL FACTORS: Past/current experiences, Time since onset of injury/illness/exacerbation, Transportation, and 3+ comorbidities: Hypertension, osteoporosis, osteoarthritis, anxiety, allergies, and memory impairments  are also affecting patient's functional outcome.   REHAB POTENTIAL: Fair    CLINICAL DECISION MAKING: Evolving/moderate complexity  EVALUATION COMPLEXITY: Moderate  PLAN:  PT FREQUENCY: 2x/week  PT DURATION: 6 weeks  PLANNED INTERVENTIONS: Therapeutic exercises, Therapeutic activity, Neuromuscular re-education, Balance training, Gait training, Patient/Family education, Self Care, Stair training, Electrical stimulation, Cryotherapy, Moist heat, Manual therapy, and Re-evaluation  PLAN FOR NEXT SESSION: NuStep, lower extremity strengthening, and balance interventions  Standley Brooking, PTA 03/01/2023, 11:25 AM

## 2023-03-04 ENCOUNTER — Ambulatory Visit (HOSPITAL_COMMUNITY)
Admission: RE | Admit: 2023-03-04 | Discharge: 2023-03-04 | Disposition: A | Discharge status: Home / Self Care | Payer: Medicare PPO | Source: Ambulatory Visit | Attending: Neurology | Admitting: Neurology

## 2023-03-04 DIAGNOSIS — R4189 Other symptoms and signs involving cognitive functions and awareness: Secondary | ICD-10-CM

## 2023-03-04 DIAGNOSIS — R32 Unspecified urinary incontinence: Secondary | ICD-10-CM

## 2023-03-04 DIAGNOSIS — G20C Parkinsonism, unspecified: Secondary | ICD-10-CM

## 2023-03-04 DIAGNOSIS — M5126 Other intervertebral disc displacement, lumbar region: Secondary | ICD-10-CM | POA: Diagnosis not present

## 2023-03-04 DIAGNOSIS — M542 Cervicalgia: Secondary | ICD-10-CM | POA: Diagnosis not present

## 2023-03-06 ENCOUNTER — Ambulatory Visit: Payer: Medicare PPO | Admitting: Physical Therapy

## 2023-03-06 ENCOUNTER — Encounter: Payer: Self-pay | Admitting: Physical Therapy

## 2023-03-06 DIAGNOSIS — M6281 Muscle weakness (generalized): Secondary | ICD-10-CM | POA: Diagnosis not present

## 2023-03-06 DIAGNOSIS — R2681 Unsteadiness on feet: Secondary | ICD-10-CM

## 2023-03-06 NOTE — Therapy (Addendum)
OUTPATIENT PHYSICAL THERAPY NEURO TREATMENT   Patient Name: Shelly Bennett MRN: [id number] DOB:June 27, 1951, 72 y.o., female Today's Date: 03/06/2023  PCP: Dettinger, Fransisca Kaufmann, MD REFERRING PROVIDER: Marcial Pacas, MD   END OF SESSION:  PT End of Session - 03/06/23 1038     Visit Number 9    Number of Visits 12    Date for PT Re-Evaluation 04/12/23    PT Start Time 1033    PT Stop Time 10    PT Time Calculation (min) 42 min    Activity Tolerance Patient tolerated treatment well    Behavior During Therapy [facility] for tasks assessed/performed            Past Medical History:  Diagnosis Date   Adenomatous colon polyp    Allergic drug reaction 06/25/2015   Allergy    Anxiety    Arthritis    At risk for falls    fallen 4 times recently    CAD (coronary artery disease)    Dr. Burt Knack follows    Carpal tunnel syndrome    Chronic headaches    Coccydynia 02/01/2012   Cystocele    Diverticulosis    External hemorrhoids    Focal nodular hyperplasia of liver    Gait abnormality    GERD (gastroesophageal reflux disease)    some   HCAP (healthcare-associated pneumonia) 06/25/2015   Heart murmur    HLD (hyperlipidemia)    HTN (hypertension)    IBS (irritable bowel syndrome)    Memory loss    Neuromuscular disorder ([place])    some neuropathy issues in the past- legs fibromyalgia in past-- past hx of steroid injections   Obesity    Osteoporosis    Otitis of left ear 02/01/2017   Pneumonia    Skin cancer    squamous cell - face   Past Surgical History:  Procedure Laterality Date   CARPAL TUNNEL RELEASE     COLONOSCOPY  12/15/2008   diverticulosis, external hemorrhoids   KNEE SURGERY  05/22/2015   right   LAPAROSCOPY     SKIN SURGERY     squamous cell - right face    Patient Active Problem List   Diagnosis Date Noted   Parkinsonism 01/24/2023   Urinary incontinence 01/24/2023   Pain in right knee 12/05/2022   Pain in left knee 10/23/2022   Bilateral primary  osteoarthritis of knee 10/04/2022   Gait abnormality 03/10/2020   Gastroesophageal reflux disease 10/19/2019   Third degree uterine prolapse 10/19/2019   Spinal stenosis of lumbar region without neurogenic claudication 09/15/2019   Urinary and fecal incontinence 09/15/2019   Cystocele with rectocele 09/15/2019   Left lumbar radiculopathy 01/22/2018   Cognitive impairment 10/22/2017   Anxiety 10/22/2017   Aortic atherosclerosis ([place]) 12/04/2016   Vitamin D deficiency 08/19/2014   Osteopenia 08/12/2013   CAD (coronary artery disease), native coronary artery 03/22/2013   Aortic valve stenosis, mild 03/22/2013   Hyperlipemia 03/11/2013   Hypertension 03/11/2013   Allergic rhinitis 03/11/2013   IBS (irritable bowel syndrome) - with chronic recurrent abdominal pain 02/01/2012   Personal history of colonic polyps - adenoma 02/01/2012   ONSET DATE: November-December 2023  REFERRING DIAG: Cognitive impairment; Parkinsonism, unspecified Parkinsonism type; Urinary incontinence, unspecified type   THERAPY DIAG:  Unsteadiness on feet  Muscle weakness (generalized)  Rationale for Evaluation and Treatment: Rehabilitation  SUBJECTIVE:  SUBJECTIVE STATEMENT: Reports she has one more scan next week. Feels as if her legs are crawling.  PERTINENT HISTORY: Hypertension, osteoporosis, osteoarthritis, anxiety, allergies, and memory impairments  PAIN:  Are you having pain? Yes: NPRS scale: 3/10 Pain location: left hip Pain description: ttingling, numb  PRECAUTIONS: Fall  WEIGHT BEARING RESTRICTIONS: No  FALLS: Has patient fallen in last 6 months? Yes. Number of falls 2  PATIENT GOALS: improved safety and balance, improved ease getting into and out of her bed  OBJECTIVE:    SENSATION: Patient reports  tingling in her left lower extremity with no dermatomal pattern. Light touch: increased sensitivity along L4-5 dermatomes  COORDINATION: Mild tremor observed  DTRs:  Patella 1 = Trace  and Achilles 1 = Trace   POSTURE: flexed trunk   LOWER EXTREMITY MMT:    MMT Right Eval Left Eval  Hip flexion 4/5 4-/5  Hip extension    Hip abduction    Hip adduction    Hip internal rotation    Hip external rotation    Knee flexion 4+/5 4+/5  Knee extension 4/5; crepitus 4-/5; crepitus  Ankle dorsiflexion 4+/5 4+/5  Ankle plantarflexion    Ankle inversion    Ankle eversion    (Blank rows = not tested)  BED MOBILITY:  To be assessed at follow-up appointments as able  TRANSFERS: Assistive device utilized: Environmental consultant - 4 wheeled  Sit to stand: Modified independence and required UE support from arm rests Stand to sit: Modified independence and required UE support from armrest  GAIT: Gait pattern: step to pattern, decreased step length- Left, decreased stance time- Right, decreased stride length, decreased hip/knee flexion- Right, decreased hip/knee flexion- Left, decreased ankle dorsiflexion- Right, decreased ankle dorsiflexion- Left, shuffling, decreased trunk rotation, poor foot clearance- Right, and poor foot clearance- Left Assistive device utilized: Walker - 4 wheeled Level of assistance: Modified independence  FUNCTIONAL TESTS:  5 times sit to stand: 48.98 seconds with UE support from armrests Timed up and go (TUG): 26.53 seconds with Rolator  Slump Test: negative  TODAY'S TREATMENT:                                                                                                                              DATE:                      3/20     EXERCISE LOG  Exercise Repetitions and Resistance Comments  Nustep L3 x15 min   Toe taps 8" box x15 reps   Sidestepping In // bars x5 RT   Sit to stands X10 reps with UE support   LAQ 3# x20 reps   Hip clam Red x20 reps   Ball squeeze  X3  min 5 sec holds   Exaggerated hip flexion  Sitting x20 reps For reported difficulty in donning clothes especially socks.   Blank cell = exercise not performed today  3/15   EXERCISE LOG  Exercise Repetitions and Resistance Comments  Nustep L3 x15 min   Hip clam Green theraband x20 reps   HS curl  Green theraband x20 reps each   Ball squeeze X3 min   LAQ 5# x25 reps each   Seated marching 5# x20 reps each   Sit to stands X5 reps in 46 seconds    Blank cell = exercise not performed today   PATIENT EDUCATION: Education details: HEP and red theraband Person educated: Patient Education method: Explanation, demonstration and handout Education comprehension: verbalized understanding  HOME EXERCISE PROGRAM: [id number]  GOALS: Goals reviewed with patient? Yes  SHORT TERM GOALS: Target date: 02/27/23  Patient will be independent with her initial HEP. Baseline: Goal status: On-going  2.  Patient will improve her 5 times sit to stand to 38 seconds or less for improved lower extremity power. Baseline:  Goal status: On-going  3.  Patient will improve her timed up and go to 20 seconds or less for improved safety. Baseline:  Goal status: On-going  LONG TERM GOALS: Target date: 03/20/23  Patient will be independent with her advanced HEP. Baseline:  Goal status: On-going  2.  Patient will improve her 5 times sit to stand to 30 seconds or less for improved lower extremity power. Baseline:  Goal status: on-going  3.  Patient will improve her timed up and go to 15 seconds or less for improved safety. Baseline:  Goal status: On-going  4.  Patient will be able to transfer from sitting to standing with minimal difficulty for improved independence. Baseline: Initial evaluation: Maximal difficulty Goal status: On-going  ASSESSMENT:  CLINICAL IMPRESSION:   Patient presented in clinic with reports of being fatigued already. Patient reported more difficulty with L  hip today as well with pain and tingling throughout BLE. Patient able to tolerate standing exercise and sit to stands well with max UE support. Patient reports difficulty in donning clothes at times especially pants and socks. Patient encouraged to take larger steps and emphasize hip flexion as indicated. Patient reported fatigue by end of PT session.  OBJECTIVE IMPAIRMENTS: Abnormal gait, decreased activity tolerance, decreased balance, decreased mobility, difficulty walking, decreased strength, impaired sensation, postural dysfunction, and pain.   ACTIVITY LIMITATIONS: carrying, lifting, standing, squatting, sleeping, stairs, transfers, bed mobility, dressing, locomotion level, and caring for others  PARTICIPATION LIMITATIONS: meal prep, cleaning, laundry, shopping, and community activity  PERSONAL FACTORS: Past/current experiences, Time since onset of injury/illness/exacerbation, Transportation, and 3+ comorbidities: Hypertension, osteoporosis, osteoarthritis, anxiety, allergies, and memory impairments  are also affecting patient's functional outcome.   REHAB POTENTIAL: Fair    CLINICAL DECISION MAKING: Evolving/moderate complexity  EVALUATION COMPLEXITY: Moderate  PLAN:  PT FREQUENCY: 2x/week  PT DURATION: 6 weeks  PLANNED INTERVENTIONS: Therapeutic exercises, Therapeutic activity, Neuromuscular re-education, Balance training, Gait training, Patient/Family education, Self Care, Stair training, Electrical stimulation, Cryotherapy, Moist heat, Manual therapy, and Re-evaluation  PLAN FOR NEXT SESSION: NuStep, lower extremity strengthening, and balance interventions  Standley Brooking, PTA 03/06/2023, 11:31 AM

## 2023-03-08 ENCOUNTER — Encounter: Payer: Self-pay | Admitting: Physical Therapy

## 2023-03-08 ENCOUNTER — Ambulatory Visit: Payer: Medicare PPO | Admitting: Physical Therapy

## 2023-03-08 DIAGNOSIS — M6281 Muscle weakness (generalized): Secondary | ICD-10-CM | POA: Diagnosis not present

## 2023-03-08 DIAGNOSIS — R2681 Unsteadiness on feet: Secondary | ICD-10-CM | POA: Diagnosis not present

## 2023-03-08 NOTE — Therapy (Addendum)
OUTPATIENT PHYSICAL THERAPY NEURO TREATMENT   Patient Name: Shelly Bennett MRN: [id number] DOB:September 03, 1951, 72 y.o., female Today's Date: 03/08/2023  PCP: Dettinger, Elige Radon, MD REFERRING PROVIDER: Levert Feinstein, MD   END OF SESSION:  PT End of Session - 03/08/23 1055     Visit Number 10    Number of Visits 12    Date for PT Re-Evaluation 04/12/23    PT Start Time 1033    PT Stop Time 1122    PT Time Calculation (min) 49 min    Activity Tolerance Patient tolerated treatment well    Behavior During Therapy [facility] for tasks assessed/performed            Past Medical History:  Diagnosis Date   Adenomatous colon polyp    Allergic drug reaction 06/25/2015   Allergy    Anxiety    Arthritis    At risk for falls    fallen 4 times recently    CAD (coronary artery disease)    Dr. Excell Seltzer follows    Carpal tunnel syndrome    Chronic headaches    Coccydynia 02/01/2012   Cystocele    Diverticulosis    External hemorrhoids    Focal nodular hyperplasia of liver    Gait abnormality    GERD (gastroesophageal reflux disease)    some   HCAP (healthcare-associated pneumonia) 06/25/2015   Heart murmur    HLD (hyperlipidemia)    HTN (hypertension)    IBS (irritable bowel syndrome)    Memory loss    Neuromuscular disorder (HCC)    some neuropathy issues in the past- legs fibromyalgia in past-- past hx of steroid injections   Obesity    Osteoporosis    Otitis of left ear 02/01/2017   Pneumonia    Skin cancer    squamous cell - face   Past Surgical History:  Procedure Laterality Date   CARPAL TUNNEL RELEASE     COLONOSCOPY  12/15/2008   diverticulosis, external hemorrhoids   KNEE SURGERY  05/22/2015   right   LAPAROSCOPY     SKIN SURGERY     squamous cell - right face    Patient Active Problem List   Diagnosis Date Noted   Parkinsonism 01/24/2023   Urinary incontinence 01/24/2023   Pain in right knee 12/05/2022   Pain in left knee 10/23/2022   Bilateral primary  osteoarthritis of knee 10/04/2022   Gait abnormality 03/10/2020   Gastroesophageal reflux disease 10/19/2019   Third degree uterine prolapse 10/19/2019   Spinal stenosis of lumbar region without neurogenic claudication 09/15/2019   Urinary and fecal incontinence 09/15/2019   Cystocele with rectocele 09/15/2019   Left lumbar radiculopathy 01/22/2018   Cognitive impairment 10/22/2017   Anxiety 10/22/2017   Aortic atherosclerosis (HCC) 12/04/2016   Vitamin D deficiency 08/19/2014   Osteopenia 08/12/2013   CAD (coronary artery disease), native coronary artery 03/22/2013   Aortic valve stenosis, mild 03/22/2013   Hyperlipemia 03/11/2013   Hypertension 03/11/2013   Allergic rhinitis 03/11/2013   IBS (irritable bowel syndrome) - with chronic recurrent abdominal pain 02/01/2012   Personal history of colonic polyps - adenoma 02/01/2012   ONSET DATE: November-December 2023  REFERRING DIAG: Cognitive impairment; Parkinsonism, unspecified Parkinsonism type; Urinary incontinence, unspecified type   THERAPY DIAG:  Unsteadiness on feet  Muscle weakness (generalized)  Rationale for Evaluation and Treatment: Rehabilitation  SUBJECTIVE:  SUBJECTIVE STATEMENT: Reports that she is tired.  PERTINENT HISTORY: Hypertension, osteoporosis, osteoarthritis, anxiety, allergies, and memory impairments  PAIN:  Are you having pain? Yes: NPRS scale: no pain score provided/10 Pain location: left hip Pain description: ttingling, numb  PRECAUTIONS: Fall  WEIGHT BEARING RESTRICTIONS: No  FALLS: Has patient fallen in last 6 months? Yes. Number of falls 2  PATIENT GOALS: improved safety and balance, improved ease getting into and out of her bed  OBJECTIVE:    SENSATION: Patient reports tingling in her left lower  extremity with no dermatomal pattern. Light touch: increased sensitivity along L4-5 dermatomes  COORDINATION: Mild tremor observed  DTRs:  Patella 1 = Trace  and Achilles 1 = Trace   POSTURE: flexed trunk   LOWER EXTREMITY MMT:    MMT Right Eval Left Eval  Hip flexion 4/5 4-/5  Hip extension    Hip abduction    Hip adduction    Hip internal rotation    Hip external rotation    Knee flexion 4+/5 4+/5  Knee extension 4/5; crepitus 4-/5; crepitus  Ankle dorsiflexion 4+/5 4+/5  Ankle plantarflexion    Ankle inversion    Ankle eversion    (Blank rows = not tested)  BED MOBILITY:  To be assessed at follow-up appointments as able  TRANSFERS: Assistive device utilized: Environmental consultant - 4 wheeled  Sit to stand: Modified independence and required UE support from arm rests Stand to sit: Modified independence and required UE support from armrest  GAIT: Gait pattern: step to pattern, decreased step length- Left, decreased stance time- Right, decreased stride length, decreased hip/knee flexion- Right, decreased hip/knee flexion- Left, decreased ankle dorsiflexion- Right, decreased ankle dorsiflexion- Left, shuffling, decreased trunk rotation, poor foot clearance- Right, and poor foot clearance- Left Assistive device utilized: Walker - 4 wheeled Level of assistance: Modified independence  FUNCTIONAL TESTS:  5 times sit to stand: 48.98 seconds with UE support from armrests Timed up and go (TUG): 26.53 seconds with Rolator  Slump Test: negative  TODAY'S TREATMENT:                                                                                                                              DATE:            3/22    EXERCISE LOG  Exercise Repetitions and Resistance Comments  Nustep L2 x16 min   HS curl Red x20 reps   Clam Red x30 reps   Ball squeeze X3 min    LAQ 5# x30 reps   Exaggerated hip flexion Sitting x15 reps   Seated LE D1  AROM x10 reps More pain in L hip   Blank cell = exercise  not performed today                    3/20     EXERCISE LOG  Exercise Repetitions and Resistance Comments  Nustep L3 x15 min   Toe taps 8"  box x15 reps   Sidestepping In // bars x5 RT   Sit to stands X10 reps with UE support   LAQ 3# x20 reps   Hip clam Red x20 reps   Ball squeeze  X3 min 5 sec holds   Exaggerated hip flexion  Sitting x20 reps For reported difficulty in donning clothes especially socks.   Blank cell = exercise not performed today   PATIENT EDUCATION: Education details: HEP and red theraband Person educated: Patient Education method: Explanation, demonstration and handout Education comprehension: verbalized understanding  HOME EXERCISE PROGRAM: [id number]  GOALS: Goals reviewed with patient? Yes  SHORT TERM GOALS: Target date: 02/27/23  Patient will be independent with her initial HEP. Baseline: Goal status: On-going  2.  Patient will improve her 5 times sit to stand to 38 seconds or less for improved lower extremity power. Baseline:  Goal status: On-going  3.  Patient will improve her timed up and go to 20 seconds or less for improved safety. Baseline:  Goal status: On-going  LONG TERM GOALS: Target date: 03/20/23  Patient will be independent with her advanced HEP. Baseline:  Goal status: On-going  2.  Patient will improve her 5 times sit to stand to 30 seconds or less for improved lower extremity power. Baseline:  Goal status: on-going  3.  Patient will improve her timed up and go to 15 seconds or less for improved safety. Baseline:  Goal status: On-going  4.  Patient will be able to transfer from sitting to standing with minimal difficulty for improved independence. Baseline: Initial evaluation: Maximal difficulty Goal status: On-going  ASSESSMENT:  CLINICAL IMPRESSION:   Patient presented in clinic with fatigue and lack of endurance. Patient able to demonstrate appropriate foot clearance today while ambulating with rollator. Patient  progressed through functional ROM exercises such as LE D1 and hip flexion to improve ability to don pants and socks.   03/08/23 PROGRESS REPORT: Patient is making fair progress with skilled physical therapy as evidenced by her functional mobility and progress toward her goals. She has not yet met any of her short or long term goals. Recommend that she continue with her current plan of care prior to being reassessed to evaluate for a potential plateau in progress.   Candi Leash, PT, DPT   PHYSICAL THERAPY DISCHARGE SUMMARY  Visits from Start of Care: 10  Current functional level related to goals / functional outcomes: Patient was unable to meet her goals for skilled physical therapy.    Remaining deficits: Lower extremity strength and power    Education / Equipment: HEP   Patient agrees to discharge. Patient goals were not met. Patient is being discharged due to maximized rehab potential.   Candi Leash, PT, DPT    OBJECTIVE IMPAIRMENTS: Abnormal gait, decreased activity tolerance, decreased balance, decreased mobility, difficulty walking, decreased strength, impaired sensation, postural dysfunction, and pain.   ACTIVITY LIMITATIONS: carrying, lifting, standing, squatting, sleeping, stairs, transfers, bed mobility, dressing, locomotion level, and caring for others  PARTICIPATION LIMITATIONS: meal prep, cleaning, laundry, shopping, and community activity  PERSONAL FACTORS: Past/current experiences, Time since onset of injury/illness/exacerbation, Transportation, and 3+ comorbidities: Hypertension, osteoporosis, osteoarthritis, anxiety, allergies, and memory impairments  are also affecting patient's functional outcome.   REHAB POTENTIAL: Fair    CLINICAL DECISION MAKING: Evolving/moderate complexity  EVALUATION COMPLEXITY: Moderate  PLAN:  PT FREQUENCY: 2x/week  PT DURATION: 6 weeks  PLANNED INTERVENTIONS: Therapeutic exercises, Therapeutic activity, Neuromuscular  re-education, Balance training, Gait training, Patient/Family education, Self Care, Stair  training, Electrical stimulation, Cryotherapy, Moist heat, Manual therapy, and Re-evaluation  PLAN FOR NEXT SESSION: NuStep, lower extremity strengthening, and balance interventions  Marvell Fuller, PTA 03/08/2023, 11:49 AM

## 2023-03-12 ENCOUNTER — Telehealth: Payer: Self-pay | Admitting: Neurology

## 2023-03-12 ENCOUNTER — Encounter: Payer: Self-pay | Admitting: Neurology

## 2023-03-12 ENCOUNTER — Emergency Department (HOSPITAL_BASED_OUTPATIENT_CLINIC_OR_DEPARTMENT_OTHER): Payer: Medicare PPO

## 2023-03-12 ENCOUNTER — Other Ambulatory Visit: Payer: Self-pay

## 2023-03-12 ENCOUNTER — Inpatient Hospital Stay (HOSPITAL_BASED_OUTPATIENT_CLINIC_OR_DEPARTMENT_OTHER)
Admission: EM | Admit: 2023-03-12 | Discharge: 2023-03-15 | DRG: 057 | Disposition: A | Discharge status: Home Health | Payer: Medicare PPO | Attending: Internal Medicine | Admitting: Internal Medicine

## 2023-03-12 DIAGNOSIS — Z8249 Family history of ischemic heart disease and other diseases of the circulatory system: Secondary | ICD-10-CM | POA: Diagnosis not present

## 2023-03-12 DIAGNOSIS — I2510 Atherosclerotic heart disease of native coronary artery without angina pectoris: Secondary | ICD-10-CM | POA: Diagnosis present

## 2023-03-12 DIAGNOSIS — R296 Repeated falls: Secondary | ICD-10-CM | POA: Diagnosis present

## 2023-03-12 DIAGNOSIS — M810 Age-related osteoporosis without current pathological fracture: Secondary | ICD-10-CM | POA: Diagnosis present

## 2023-03-12 DIAGNOSIS — Z800 Family history of malignant neoplasm of digestive organs: Secondary | ICD-10-CM | POA: Diagnosis not present

## 2023-03-12 DIAGNOSIS — Z885 Allergy status to narcotic agent status: Secondary | ICD-10-CM

## 2023-03-12 DIAGNOSIS — Z820 Family history of epilepsy and other diseases of the nervous system: Secondary | ICD-10-CM

## 2023-03-12 DIAGNOSIS — F32A Depression, unspecified: Secondary | ICD-10-CM | POA: Diagnosis present

## 2023-03-12 DIAGNOSIS — Z86010 Personal history of colonic polyps: Secondary | ICD-10-CM

## 2023-03-12 DIAGNOSIS — E785 Hyperlipidemia, unspecified: Secondary | ICD-10-CM | POA: Diagnosis present

## 2023-03-12 DIAGNOSIS — M797 Fibromyalgia: Secondary | ICD-10-CM | POA: Diagnosis present

## 2023-03-12 DIAGNOSIS — Z79899 Other long term (current) drug therapy: Secondary | ICD-10-CM

## 2023-03-12 DIAGNOSIS — I10 Essential (primary) hypertension: Secondary | ICD-10-CM | POA: Diagnosis present

## 2023-03-12 DIAGNOSIS — Z9181 History of falling: Secondary | ICD-10-CM

## 2023-03-12 DIAGNOSIS — R2689 Other abnormalities of gait and mobility: Secondary | ICD-10-CM | POA: Diagnosis present

## 2023-03-12 DIAGNOSIS — Z8262 Family history of osteoporosis: Secondary | ICD-10-CM | POA: Diagnosis not present

## 2023-03-12 DIAGNOSIS — Z841 Family history of disorders of kidney and ureter: Secondary | ICD-10-CM | POA: Diagnosis not present

## 2023-03-12 DIAGNOSIS — R4189 Other symptoms and signs involving cognitive functions and awareness: Secondary | ICD-10-CM | POA: Diagnosis present

## 2023-03-12 DIAGNOSIS — G912 (Idiopathic) normal pressure hydrocephalus: Principal | ICD-10-CM | POA: Diagnosis present

## 2023-03-12 DIAGNOSIS — Z043 Encounter for examination and observation following other accident: Secondary | ICD-10-CM | POA: Diagnosis not present

## 2023-03-12 DIAGNOSIS — R531 Weakness: Secondary | ICD-10-CM | POA: Diagnosis not present

## 2023-03-12 DIAGNOSIS — Z888 Allergy status to other drugs, medicaments and biological substances status: Secondary | ICD-10-CM

## 2023-03-12 DIAGNOSIS — Z7982 Long term (current) use of aspirin: Secondary | ICD-10-CM

## 2023-03-12 DIAGNOSIS — E876 Hypokalemia: Secondary | ICD-10-CM | POA: Diagnosis present

## 2023-03-12 DIAGNOSIS — Z8042 Family history of malignant neoplasm of prostate: Secondary | ICD-10-CM

## 2023-03-12 DIAGNOSIS — Z85828 Personal history of other malignant neoplasm of skin: Secondary | ICD-10-CM

## 2023-03-12 DIAGNOSIS — G20C Parkinsonism, unspecified: Secondary | ICD-10-CM | POA: Diagnosis not present

## 2023-03-12 DIAGNOSIS — K5900 Constipation, unspecified: Secondary | ICD-10-CM | POA: Diagnosis present

## 2023-03-12 DIAGNOSIS — Z83438 Family history of other disorder of lipoprotein metabolism and other lipidemia: Secondary | ICD-10-CM | POA: Diagnosis not present

## 2023-03-12 DIAGNOSIS — W19XXXA Unspecified fall, initial encounter: Secondary | ICD-10-CM | POA: Diagnosis present

## 2023-03-12 DIAGNOSIS — K219 Gastro-esophageal reflux disease without esophagitis: Secondary | ICD-10-CM | POA: Diagnosis present

## 2023-03-12 DIAGNOSIS — G8929 Other chronic pain: Secondary | ICD-10-CM | POA: Diagnosis present

## 2023-03-12 DIAGNOSIS — M549 Dorsalgia, unspecified: Secondary | ICD-10-CM | POA: Diagnosis present

## 2023-03-12 DIAGNOSIS — R413 Other amnesia: Secondary | ICD-10-CM | POA: Diagnosis present

## 2023-03-12 DIAGNOSIS — F419 Anxiety disorder, unspecified: Secondary | ICD-10-CM | POA: Diagnosis present

## 2023-03-12 DIAGNOSIS — M25511 Pain in right shoulder: Secondary | ICD-10-CM | POA: Diagnosis not present

## 2023-03-12 LAB — COMPREHENSIVE METABOLIC PANEL
ALT: 18 U/L (ref 0–44)
AST: 25 U/L (ref 15–41)
Albumin: 4.1 g/dL (ref 3.5–5.0)
Alkaline Phosphatase: 71 U/L (ref 38–126)
Anion gap: 10 (ref 5–15)
BUN: 12 mg/dL (ref 8–23)
CO2: 23 mmol/L (ref 22–32)
Calcium: 9.3 mg/dL (ref 8.9–10.3)
Chloride: 104 mmol/L (ref 98–111)
Creatinine, Ser: 0.86 mg/dL (ref 0.44–1.00)
GFR, Estimated: >60 mL/min (ref >60)
Glucose, Bld: 118 mg/dL — ABNORMAL HIGH (ref 70–99)
Potassium: 3 mmol/L — ABNORMAL LOW (ref 3.5–5.1)
Sodium: 137 mmol/L (ref 135–145)
Total Bilirubin: 1.2 mg/dL (ref 0.3–1.2)
Total Protein: 6.9 g/dL (ref 6.5–8.1)

## 2023-03-12 LAB — CBC
HCT: 38.6 % (ref 36.0–46.0)
Hemoglobin: 13.5 g/dL (ref 12.0–15.0)
MCH: 31.1 pg (ref 26.0–34.0)
MCHC: 35 g/dL (ref 30.0–36.0)
MCV: 88.9 fL (ref 80.0–100.0)
Platelets: 290 10*3/uL (ref 150–400)
RBC: 4.34 MIL/uL (ref 3.87–5.11)
RDW: 13.8 % (ref 11.5–15.5)
WBC: 10.4 10*3/uL (ref 4.0–10.5)
nRBC: 0 % (ref 0.0–0.2)

## 2023-03-12 LAB — CBG MONITORING, ED: Glucose-Capillary: 111 mg/dL — ABNORMAL HIGH (ref 70–99)

## 2023-03-12 MED ORDER — POTASSIUM CHLORIDE CRYS ER 20 MEQ PO TBCR
40.0000 meq | EXTENDED_RELEASE_TABLET | Freq: Once | ORAL | Status: AC
  Administered 2023-03-12: 40 meq via ORAL
  Filled 2023-03-12: qty 2

## 2023-03-12 MED ORDER — OXYCODONE-ACETAMINOPHEN 5-325 MG PO TABS
1.0000 | ORAL_TABLET | Freq: Once | ORAL | Status: AC
  Administered 2023-03-12: 1 via ORAL
  Filled 2023-03-12: qty 1

## 2023-03-12 MED ORDER — HYDROMORPHONE HCL 1 MG/ML IJ SOLN
0.5000 mg | Freq: Once | INTRAMUSCULAR | Status: AC
  Administered 2023-03-12: 0.5 mg via INTRAVENOUS
  Filled 2023-03-12: qty 1

## 2023-03-12 MED ORDER — HYDROMORPHONE HCL 1 MG/ML IJ SOLN
0.5000 mg | INTRAMUSCULAR | Status: AC | PRN
  Administered 2023-03-13 – 2023-03-14 (×3): 0.5 mg via INTRAVENOUS
  Filled 2023-03-12 (×3): qty 0.5

## 2023-03-12 MED ORDER — ONDANSETRON 4 MG PO TBDP
4.0000 mg | ORAL_TABLET | Freq: Once | ORAL | Status: AC
  Administered 2023-03-12: 4 mg via ORAL
  Filled 2023-03-12: qty 1

## 2023-03-12 MED ORDER — HYDROCODONE-ACETAMINOPHEN 5-325 MG PO TABS
1.0000 | ORAL_TABLET | Freq: Once | ORAL | Status: AC
  Administered 2023-03-12: 1 via ORAL
  Filled 2023-03-12: qty 1

## 2023-03-12 MED ORDER — OXYCODONE HCL 5 MG PO TABS
5.0000 mg | ORAL_TABLET | Freq: Four times a day (QID) | ORAL | Status: AC | PRN
  Administered 2023-03-13 (×3): 5 mg via ORAL
  Filled 2023-03-12 (×3): qty 1

## 2023-03-12 MED ORDER — ACETAMINOPHEN 325 MG PO TABS
650.0000 mg | ORAL_TABLET | Freq: Four times a day (QID) | ORAL | Status: DC | PRN
  Administered 2023-03-14 (×2): 650 mg via ORAL
  Filled 2023-03-12 (×2): qty 2

## 2023-03-12 NOTE — Telephone Encounter (Signed)
I reviewed MRI images with neuroradiology.   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 Severe central stenosis can cause gait abnormalities  Also multilevel foraminal stenosis that could cause arm and neck pain from nerve pinching. Recommend neurosurgery referral if she is ammenable send a referral (or did Dr. Rhea Belton note say she already had a neurosurgeon? Please ask and if so who is it) and place referral to neurosurgery for cervical spine stenosis under Dr. Rhea Belton name to neurosurgery thanks   IMPRESSION: 1. No change in the appearance of the cervical spine since the prior MRI. 2. A central protrusion indents the ventral cord at C3-4. Moderate bilateral foraminal narrowing at this level appears worse on the left. 3. Right paracentral protrusion indents the cord at C4-5. Mild bilateral foraminal narrowing at this level. 4. Moderately severe to severe foraminal narrowing at C5-6 is worse on the right.

## 2023-03-12 NOTE — ED Provider Notes (Signed)
[facility] Provider Note   CSN: [id number] Arrival date & time: 03/12/23  1147     History {Add pertinent medical, surgical, social history, OB history to HPI:1} Chief Complaint  Patient presents with   Weakness    Marquida Carlsson is a 72 y.o. female presents today for evaluation of frequent falls.  Patient states she had a fall last night when she was trying to get into her car.  She fell on gravel hit the back of her head, right shoulder and buttocks.  This morning she had a fall while trying to get up and go to the bathroom.  She states she felt dizzy before the fall.  Landed on her buttocks and fell back.  She complains of pain in the back of her head, right shoulder and low back.  States she has been feeling weak in the last few days.  Denies any fever, chest pain, shortness of breath, bowel changes, urinary symptoms.   Weakness     Past Medical History:  Diagnosis Date   Adenomatous colon polyp    Allergic drug reaction 06/25/2015   Allergy    Anxiety    Arthritis    At risk for falls    fallen 4 times recently    CAD (coronary artery disease)    Dr. Burt Knack follows    Carpal tunnel syndrome    Chronic headaches    Coccydynia 02/01/2012   Cystocele    Diverticulosis    External hemorrhoids    Focal nodular hyperplasia of liver    Gait abnormality    GERD (gastroesophageal reflux disease)    some   HCAP (healthcare-associated pneumonia) 06/25/2015   Heart murmur    HLD (hyperlipidemia)    HTN (hypertension)    IBS (irritable bowel syndrome)    Memory loss    Neuromuscular disorder ([place])    some neuropathy issues in the past- legs fibromyalgia in past-- past hx of steroid injections   Obesity    Osteoporosis    Otitis of left ear 02/01/2017   Pneumonia    Skin cancer    squamous cell - face   Past Surgical History:  Procedure Laterality Date   CARPAL TUNNEL RELEASE     COLONOSCOPY  12/15/2008    diverticulosis, external hemorrhoids   KNEE SURGERY  05/22/2015   right   LAPAROSCOPY     SKIN SURGERY     squamous cell - right face      Home Medications Prior to Admission medications   Medication Sig Start Date End Date Taking? Authorizing Provider  amLODipine (NORVASC) 10 MG tablet Take 1 tablet (10 mg total) by mouth daily. 09/18/22   Sherren Mocha, MD  aspirin 81 MG tablet Take 81 mg by mouth daily.     [provider]  buPROPion (WELLBUTRIN XL) 150 MG 24 hr tablet TAKE ONE TABLET ONCE DAILY 01/14/23   Dettinger, Fransisca Kaufmann, MD  Calcium Carbonate (CALCARB 600 PO) Take by mouth.    [provider]  carbidopa-levodopa (SINEMET IR) 25-100 MG tablet Take 1 tablet by mouth 3 (three) times daily. 01/24/23   Marcial Pacas, MD  Cholecalciferol (VITAMIN D-3) 5000 UNITS TABS Take 1 capsule by mouth as directed. Take one capsule by mouth daily Mon thru Friday    [provider]  donepezil (ARICEPT) 10 MG tablet Take 1 tablet (10 mg total) by mouth at bedtime. 12/31/22   Dettinger, Fransisca Kaufmann, MD  fexofenadine Delma Freeze)  180 MG tablet Take 180 mg by mouth daily.    [provider]  fluticasone (FLONASE) 50 MCG/ACT nasal spray Place 2 sprays into both nostrils daily. Patient not taking: Reported on 02/06/2023 03/30/21   Dettinger, Fransisca Kaufmann, MD  hydrochlorothiazide (HYDRODIURIL) 25 MG tablet TAKE 1/2 TABLET DAILY 02/24/23   Dettinger, Fransisca Kaufmann, MD  HYDROcodone-acetaminophen (NORCO/VICODIN) 5-325 MG tablet Take 1 tablet by mouth every 6 (six) hours as needed for moderate pain. Patient not taking: Reported on 02/06/2023 04/27/20   Garald Balding, MD  memantine (NAMENDA) 10 MG tablet Take 1 tablet (10 mg total) by mouth 2 (two) times daily. 09/20/21   Dettinger, Fransisca Kaufmann, MD  montelukast (SINGULAIR) 10 MG tablet Take 1 tablet (10 mg total) by mouth at bedtime. 01/14/23   Dettinger, Fransisca Kaufmann, MD  omega-3 acid ethyl esters (LOVAZA) 1 g capsule TAKE TWO CAPSULES TWICE DAILY  12/11/22   Dettinger, Fransisca Kaufmann, MD  rosuvastatin (CRESTOR) 20 MG tablet TAKE ONE TABLET ONCE DAILY 02/24/23   Dettinger, Fransisca Kaufmann, MD  SF 5000 PLUS 1.1 % CREA dental cream Take by mouth as directed. Patient not taking: Reported on 02/06/2023 06/28/22   [provider]      Allergies    Neomycin-polymyxin-dexameth, Codeine, Mold extract [trichophyton], Penicillins, Ace inhibitors, Angiotensin receptor blockers, Levaquin [levofloxacin], Vytorin [ezetimibe-simvastatin], and Zetia [ezetimibe]    Review of Systems   Review of Systems  Neurological:  Positive for weakness.    Physical Exam Updated Vital Signs BP (!) 132/92 (BP Location: Right Arm)   Pulse 63   Temp 97.7 F (36.5 C) (Oral)   Resp (!) 22   SpO2 100%  Physical Exam  ED Results / Procedures / Treatments   Labs (all labs ordered are listed, but only abnormal results are displayed) Labs Reviewed  CBC  URINALYSIS, ROUTINE W REFLEX MICROSCOPIC  COMPREHENSIVE METABOLIC PANEL  CBG MONITORING, ED    EKG None  Radiology No results found.  Procedures Procedures  {Document cardiac monitor, telemetry assessment procedure when appropriate:1}  Medications Ordered in ED Medications - No data to display  ED Course/ Medical Decision Making/ A&P   {   Click here for ABCD2, HEART and other calculatorsREFRESH Note before signing :1}                          Medical Decision Making Amount and/or Complexity of Data Reviewed Labs: ordered. Radiology: ordered.  Risk Prescription drug management. Decision regarding hospitalization.   This patient presents to the ED for ***, this involves an extensive number of treatment options, and is a complaint that carries with a high risk of complications and morbidity.  The differential diagnosis includes ***.  This is not an exhaustive list.  Lab tests: I ordered and personally interpreted labs.  The pertinent results include: WBC unremarkable. Hbg unremarkable. Platelets  unremarkable. Electrolytes unremarkable. BUN, creatinine unremarkable. ***  Imaging studies: I ordered imaging studies. I personally reviewed, interpreted imaging and agree with the radiologist's interpretations. The results include: ***   Problem list/ ED course/ Critical interventions/ Medical management: HPI: See above Vital signs ***within normal range and stable throughout visit. Laboratory/imaging studies significant for: See above. On physical examination, patient is afebrile and appears in no acute distress. ***Based on patient's clinical presentations and laboratory/imaging studies I suspect ***. Reevaluation of the patient after these medications showed that the patient {resolved/improved/worsened:23923::"improved"}.  I have reviewed the patient home medicines and have made adjustments  as needed.  Cardiac monitoring/EKG: The patient was maintained on a cardiac monitor.  I personally reviewed and interpreted the cardiac monitor which showed an underlying rhythm of: sinus rhythm.  Additional history obtained: External records from outside source obtained and reviewed including: Chart review including previous notes, labs, imaging.  Consultations obtained: I requested consultation with Dr. Maylene Roes hospitalist, and discussed lab and imaging findings as well as pertinent plan.  He/she ***.  Disposition Continued outpatient therapy. Follow-up with PCP*** recommended for reevaluation of symptoms. Treatment plan discussed with patient.  Pt acknowledged understanding was agreeable to the plan. Worrisome signs and symptoms were discussed with patient, and patient acknowledged understanding to return to the ED if they noticed these signs and symptoms. Patient was stable upon discharge.   This chart was dictated using voice recognition software.  Despite best efforts to proofread,  errors can occur which can change the documentation meaning.    {Document critical care time when  appropriate:1} {Document review of labs and clinical decision tools ie heart score, Chads2Vasc2 etc:1}  {Document your independent review of radiology images, and any outside records:1} {Document your discussion with family members, caretakers, and with consultants:1} {Document social determinants of health affecting pt's care:1} {Document your decision making why or why not admission, treatments were needed:1} Final Clinical Impression(s) / ED Diagnoses Final diagnoses:  None    Rx / DC Orders ED Discharge Orders     None

## 2023-03-12 NOTE — ED Notes (Signed)
Report called to Verdis Frederickson, RN for 2W #[phone] at [facility].

## 2023-03-12 NOTE — ED Notes (Signed)
Patient transported to CT 

## 2023-03-12 NOTE — ED Notes (Signed)
ED Provider at bedside. 

## 2023-03-12 NOTE — ED Triage Notes (Signed)
Patient presents to ED via POV from home. Here due to frequent falls. Patient reports bilateral leg weakness. Reports injuring her back and right shoulder during the falls. Positive head strike. Not on blood thinners. Tearful in triage.

## 2023-03-12 NOTE — ED Notes (Signed)
[facility] at bedside for transport. 

## 2023-03-12 NOTE — ED Notes (Signed)
Attempted report x 1, to call again after 2120.

## 2023-03-12 NOTE — Telephone Encounter (Signed)
See prior call about her mri cervical spine. She also has a lot of degenerative changes, multilevel central canal stenosis in her low back and nerve pinching. Again, she should see neurosurgery for her back and cervical spine. Ask if she has one (in Dr. Rhea Belton notes it said she did?) and if we can send her to a neurosurgeon for evaluation. thanks    EXAM: MRI LUMBAR SPINE WITHOUT CONTRAST   TECHNIQUE: Multiplanar, multisequence MR imaging of the lumbar spine was performed. No intravenous contrast was administered.   COMPARISON:  MRI lumbar spine 11/25/2018.   FINDINGS: Segmentation:  Standard.   Alignment:  Maintained.   Vertebrae: No acute fracture, evidence of discitis, or bone lesion. Mild superior endplate compression fractures of L1 and L2 are remote and unchanged. A few small Schmorl's nodes are noted.   Conus medullaris and cauda equina: Conus extends to the L1 level. Conus and cauda equina appear normal.   Paraspinal and other soft tissues: Right renal cyst again seen. Otherwise negative.   Disc levels:   T12-L1 is imaged in the sagittal plane only. There is a disc bulge without stenosis.   L1-2: There is a shallow disc bulge and some facet degenerative disease. Mild central canal narrowing in seen. The foramina are open.   L2-3: There is a lobulated central and left paracentral disc protrusion, mild-to-moderate facet arthropathy and ligamentum flavum thickening. Moderate central canal stenosis is present and there is narrowing in the lateral recesses which has progressed since the prior exam. The foramina are open.   L3-4: Broad-based disc bulge, ligamentum flavum thickening and facet degenerative disease again seen. There is moderately severe central canal stenosis which appears slightly worse than on the prior exam. The foramina are open.   L4-5: There is a shallow disc bulge and ligamentum flavum thickening. Mild to moderate central canal stenosis and  mild bilateral foraminal narrowing. No change.   L5-S1: There is ligamentum flavum thickening and a shallow disc bulge. The central canal and right foramen are open. Mild left foraminal narrowing. No change.   IMPRESSION: 1. Moderate central canal stenosis at L2-3 with narrowing in the lateral recesses appears slightly worse than on the prior exam. 2. Moderately severe central canal stenosis at L3-4 appears slightly worse than on the prior exam. 3. Mild to moderate central canal stenosis and mild bilateral foraminal narrowing at L4-5 is unchanged. 4. Mild left foraminal narrowing at L5-S1 is unchanged.

## 2023-03-12 NOTE — ED Notes (Signed)
Called [facility] for transport talked to [place] at 5:09

## 2023-03-13 ENCOUNTER — Encounter (HOSPITAL_COMMUNITY)
Admission: RE | Admit: 2023-03-13 | Discharge: 2023-03-13 | Disposition: A | Discharge status: Home / Self Care | Payer: Medicare PPO | Source: Ambulatory Visit | Attending: Neurology | Admitting: Neurology

## 2023-03-13 DIAGNOSIS — G20C Parkinsonism, unspecified: Secondary | ICD-10-CM | POA: Diagnosis not present

## 2023-03-13 DIAGNOSIS — R4189 Other symptoms and signs involving cognitive functions and awareness: Secondary | ICD-10-CM

## 2023-03-13 DIAGNOSIS — R296 Repeated falls: Secondary | ICD-10-CM | POA: Diagnosis not present

## 2023-03-13 DIAGNOSIS — R32 Unspecified urinary incontinence: Secondary | ICD-10-CM

## 2023-03-13 LAB — BASIC METABOLIC PANEL
Anion gap: 7 (ref 5–15)
BUN: 8 mg/dL (ref 8–23)
CO2: 24 mmol/L (ref 22–32)
Calcium: 8.9 mg/dL (ref 8.9–10.3)
Chloride: 106 mmol/L (ref 98–111)
Creatinine, Ser: 0.75 mg/dL (ref 0.44–1.00)
GFR, Estimated: >60 mL/min (ref >60)
Glucose, Bld: 115 mg/dL — ABNORMAL HIGH (ref 70–99)
Potassium: 3.4 mmol/L — ABNORMAL LOW (ref 3.5–5.1)
Sodium: 137 mmol/L (ref 135–145)

## 2023-03-13 LAB — CBC
HCT: 37.1 % (ref 36.0–46.0)
Hemoglobin: 12.9 g/dL (ref 12.0–15.0)
MCH: 31.5 pg (ref 26.0–34.0)
MCHC: 34.8 g/dL (ref 30.0–36.0)
MCV: 90.5 fL (ref 80.0–100.0)
Platelets: 235 10*3/uL (ref 150–400)
RBC: 4.1 MIL/uL (ref 3.87–5.11)
RDW: 14.3 % (ref 11.5–15.5)
WBC: 7.5 10*3/uL (ref 4.0–10.5)
nRBC: 0 % (ref 0.0–0.2)

## 2023-03-13 LAB — MAGNESIUM: Magnesium: 2.2 mg/dL (ref 1.7–2.4)

## 2023-03-13 LAB — PHOSPHORUS: Phosphorus: 3.7 mg/dL (ref 2.5–4.6)

## 2023-03-13 MED ORDER — POTASSIUM IODIDE (ANTIDOTE) 130 MG PO TABS
ORAL_TABLET | ORAL | Status: AC
  Administered 2023-03-13: 130 mg via ORAL
  Filled 2023-03-13: qty 1

## 2023-03-13 MED ORDER — MELATONIN 5 MG PO TABS: 5.0000 mg | ORAL_TABLET | Freq: Every evening | ORAL | Status: DC | PRN

## 2023-03-13 MED ORDER — DONEPEZIL HCL 10 MG PO TABS
10.0000 mg | ORAL_TABLET | Freq: Every day | ORAL | Status: DC
  Administered 2023-03-13 – 2023-03-14 (×3): 10 mg via ORAL
  Filled 2023-03-13 (×3): qty 1

## 2023-03-13 MED ORDER — MONTELUKAST SODIUM 10 MG PO TABS
10.0000 mg | ORAL_TABLET | Freq: Every day | ORAL | Status: DC
  Administered 2023-03-13 – 2023-03-14 (×3): 10 mg via ORAL
  Filled 2023-03-13 (×3): qty 1

## 2023-03-13 MED ORDER — POTASSIUM IODIDE (ANTIDOTE) 130 MG PO TABS: 130.0000 mg | ORAL_TABLET | Freq: Once | ORAL | Status: DC

## 2023-03-13 MED ORDER — ROSUVASTATIN CALCIUM 20 MG PO TABS
20.0000 mg | ORAL_TABLET | Freq: Every day | ORAL | Status: DC
  Administered 2023-03-13 – 2023-03-15 (×3): 20 mg via ORAL
  Filled 2023-03-13 (×3): qty 1

## 2023-03-13 MED ORDER — PROCHLORPERAZINE EDISYLATE 10 MG/2ML IJ SOLN: 5.0000 mg | Freq: Four times a day (QID) | INTRAMUSCULAR | Status: DC | PRN

## 2023-03-13 MED ORDER — BUPROPION HCL ER (XL) 150 MG PO TB24
150.0000 mg | ORAL_TABLET | Freq: Every day | ORAL | Status: DC
  Administered 2023-03-13 – 2023-03-15 (×3): 150 mg via ORAL
  Filled 2023-03-13 (×3): qty 1

## 2023-03-13 MED ORDER — MEMANTINE HCL 10 MG PO TABS
10.0000 mg | ORAL_TABLET | Freq: Two times a day (BID) | ORAL | Status: DC
  Administered 2023-03-13 – 2023-03-15 (×5): 10 mg via ORAL
  Filled 2023-03-13 (×5): qty 1

## 2023-03-13 MED ORDER — POTASSIUM CHLORIDE CRYS ER 20 MEQ PO TBCR
40.0000 meq | EXTENDED_RELEASE_TABLET | Freq: Once | ORAL | Status: AC
  Administered 2023-03-13: 40 meq via ORAL
  Filled 2023-03-13: qty 2

## 2023-03-13 MED ORDER — CARBIDOPA-LEVODOPA 25-100 MG PO TABS
1.0000 | ORAL_TABLET | Freq: Three times a day (TID) | ORAL | Status: DC
  Filled 2023-03-13 (×3): qty 1

## 2023-03-13 MED ORDER — POLYETHYLENE GLYCOL 3350 17 G PO PACK
17.0000 g | PACK | Freq: Every day | ORAL | Status: DC
  Administered 2023-03-14: 17 g via ORAL
  Filled 2023-03-13 (×2): qty 1

## 2023-03-13 MED ORDER — IOFLUPANE I 123 185 MBQ/2.5ML IV SOLN
4.5000 | Freq: Once | INTRAVENOUS | Status: AC | PRN
  Administered 2023-03-13: 4.5 via INTRAVENOUS
  Filled 2023-03-13: qty 5

## 2023-03-13 MED ORDER — ENOXAPARIN SODIUM 40 MG/0.4ML IJ SOSY
40.0000 mg | PREFILLED_SYRINGE | INTRAMUSCULAR | Status: DC
  Administered 2023-03-13 – 2023-03-15 (×3): 40 mg via SUBCUTANEOUS
  Filled 2023-03-13 (×3): qty 0.4

## 2023-03-13 MED ORDER — AMLODIPINE BESYLATE 10 MG PO TABS
10.0000 mg | ORAL_TABLET | Freq: Every day | ORAL | Status: DC
  Administered 2023-03-13 – 2023-03-15 (×3): 10 mg via ORAL
  Filled 2023-03-13 (×3): qty 1

## 2023-03-13 NOTE — Progress Notes (Signed)
   03/13/23 0900  Orthostatic Lying   BP- Lying 142/68  Pulse- Lying 62  Orthostatic Sitting  BP- Sitting (!) 134/96  Pulse- Sitting 68  Orthostatic Standing at 0 minutes  BP- Standing at 0 minutes 143/80  Pulse- Standing at 0 minutes 70  Orthostatic Standing at 3 minutes  BP- Standing at 3 minutes 125/72  Pulse- Standing at 3 minutes 71     Jarelis Ehlert S, PT DPT Acute Rehabilitation Services Pager [phone]  Office [phone]

## 2023-03-13 NOTE — Telephone Encounter (Signed)
Called and spoke to spouse, on dpr. Who informed me she went to ER yesterday after a fall,advised husband to call back when she came home to go over results and recommendations.

## 2023-03-13 NOTE — Telephone Encounter (Signed)
See other phone note

## 2023-03-13 NOTE — TOC Initial Note (Signed)
Transition of Care [facility]) - Initial/Assessment Note    Patient Details  Name: Shelly Bennett MRN: [id number] Date of Birth: 03-May-1951  [facility]) CM/SW Contact:    Curlene Labrum, RN Phone Number: 03/13/2023, 11:39 AM  Clinical Narrative:                 CM met with the patient at the bedside to discuss transitions of care needs.  The patient lives with her husband at the home.  She was admitted for frequent falls and was transferred to [facility] for nuclear medicine procedure.  The patient was provided with Medicare observation letter at the bedside.  The patient is currently active with [facility] Outpatient Therapy department in [place].  DME at the home includes ramp, rolator, shower seat.  No DME needed at this time.  The patient was tearful and chaplain services offered and patient appreciated Chaplain support and prayers - consult placed for Chaplain services.  CM will continue to follow the patient for discharge planning needs.  No other needs at this time.  Expected Discharge Plan: OP Rehab Barriers to Discharge: Continued Medical Work up   Patient Goals and CMS Choice Patient states their goals for this hospitalization and ongoing recovery are:: To get better and return home CMS Medicare.gov Compare Post Acute Care list provided to:: Patient Choice offered to / list presented to : Patient [place] ownership interest in [facility].provided to:: Patient    Expected Discharge Plan and Services   Discharge Planning Services: CM Consult Post Acute Care Choice: Resumption of Svcs/PTA Provider Living arrangements for the past 2 months: Single Family Home                                      Prior Living Arrangements/Services Living arrangements for the past 2 months: Single Family Home Lives with:: Spouse Patient language and need for interpreter reviewed:: Yes Do you feel safe going back to the place where you live?: Yes       Need for Family Participation in Patient Care: Yes (Comment) Care giver support system in place?: Yes (comment) Current home services: DME (DME at the home includes home ramp, Rolator, shower seat, Hand held shower head) Criminal Activity/Legal Involvement Pertinent to Current Situation/Hospitalization: No - Comment as needed  Activities of Daily Living Home Assistive Devices/Equipment: Wheelchair, Environmental consultant (specify type) ADL Screening (condition at time of admission) Patient's cognitive ability adequate to safely complete daily activities?: Yes Is the patient deaf or have difficulty hearing?: No Does the patient have difficulty seeing, even when wearing glasses/contacts?: No Does the patient have difficulty concentrating, remembering, or making decisions?: No Patient able to express need for assistance with ADLs?: No Does the patient have difficulty dressing or bathing?: No Independently performs ADLs?: Yes (appropriate for developmental age) Does the patient have difficulty walking or climbing stairs?: Yes Weakness of Legs: Both Weakness of Arms/Hands: None  Permission Sought/Granted Permission sought to share information with : Case Manager, Customer service manager, Family Supports Permission granted to share information with : Yes, Verbal Permission Granted        Permission granted to share info w Relationship: spouse - Crista Deakin [id number]     Emotional Assessment Appearance:: Appears stated age Attitude/Demeanor/Rapport: Gracious Affect (typically observed): Accepting Orientation: : Oriented to Self, Oriented to Place, Oriented to  Time, Oriented to Situation Alcohol / Substance Use: Not Applicable  Psych Involvement: No (comment)  Admission diagnosis:  Frequent falls [R29.6] Patient Active Problem List   Diagnosis Date Noted   Frequent falls 03/12/2023   Parkinsonism 01/24/2023   Urinary incontinence 01/24/2023   Pain in right knee 12/05/2022   Pain  in left knee 10/23/2022   Bilateral primary osteoarthritis of knee 10/04/2022   Gait abnormality 03/10/2020   Gastroesophageal reflux disease 10/19/2019   Third degree uterine prolapse 10/19/2019   Spinal stenosis of lumbar region without neurogenic claudication 09/15/2019   Urinary and fecal incontinence 09/15/2019   Cystocele with rectocele 09/15/2019   Left lumbar radiculopathy 01/22/2018   Cognitive impairment 10/22/2017   Anxiety 10/22/2017   Aortic atherosclerosis ([place]) 12/04/2016   Vitamin D deficiency 08/19/2014   Osteopenia 08/12/2013   CAD (coronary artery disease), native coronary artery 03/22/2013   Aortic valve stenosis, mild 03/22/2013   Hyperlipemia 03/11/2013   Hypertension 03/11/2013   Allergic rhinitis 03/11/2013   IBS (irritable bowel syndrome) - with chronic recurrent abdominal pain 02/01/2012   Personal history of colonic polyps - adenoma 02/01/2012   PCP:  Dettinger, Fransisca Kaufmann, MD Pharmacy:   [place], [address] [place] [address] Phone: [phone] Fax: [phone]  [place] [facility] [place], [place] [address] [address] [place] [address] Phone: [phone] Fax: [phone]     Social Determinants of Health (SDOH) Social History: SDOH Screenings   Food Insecurity: No Food Insecurity (03/12/2023)  Housing: Low Risk  (03/12/2023)  Transportation Needs: No Transportation Needs (03/12/2023)  Utilities: Not At Risk (03/12/2023)  Alcohol Screen: Low Risk  (07/24/2022)  Depression (PHQ2-9): Medium Risk (12/31/2022)  Financial Resource Strain: Low Risk  (07/24/2022)  Physical Activity: Insufficiently Active (07/24/2022)  Social Connections: Socially Integrated (07/24/2022)  Stress: No Stress Concern Present (07/24/2022)  Tobacco Use: Low Risk  (03/08/2023)   SDOH Interventions:     Readmission Risk Interventions     No data to display

## 2023-03-13 NOTE — Progress Notes (Signed)
PROGRESS NOTE  Shelly Bennett [id number] DOB: 1951-05-12 DOA: 03/12/2023 PCP: Dettinger, Fransisca Kaufmann, MD  HPI/Recap of past 24 hours: Shelly Bennett is a 72 y.o. female with medical history significant for chronic anxiety/depression, memory loss, hyperlipidemia, hypertension, coronary artery disease, who initially presented to [facility] ED due to frequent falls, associated with worsening gait abnormality.  Endorses multiple recent falls PTA. Had some dizziness prior to the fall.  She landed on her buttocks and then fell backwards. In the ED, VSS. Lab studies unremarkable except for serum potassium 3.0, imaging reveals no acute fracture.  Ambulated in the ED but too unstable on feet for discharge home. Pt admitted for further management.   Today, patient continues to report some chronic pain worse on her lower back.  Orthostatic vital signs done today was negative.  Patient continues to remain unbalanced.  Denies any new complaints.    Assessment/Plan: Principal Problem:   Frequent falls  Frequent falls Concern for possible neurodegenerative disorder as per outpatient neurologist, further workup in progress ?NPH Orthostatic vitals unremarkable Appears to have some parkinsonian features, including shuffling gait PT OT assessment Fall precautions Imaging revealed no acute fracture TOC consulted to assist with DME's  Hypokalemia Replace as needed   Cognitive impairment and parkinsonian features Resume home Sinemet Continue fall precautions Reorient as needed   Chronic anxiety/depression Resume home regimen   Hypertension Continue amlodipine   Hyperlipidemia Crestor      Estimated body mass index is 28.74 kg/m as calculated from the following:   Height as of 01/24/23: 5\' 8"  (1.727 m).   Weight as of 01/24/23: 85.7 kg.     Code Status: Full  Family Communication: None at bedside  Disposition Plan: Status is: Observation The patient will require care spanning  > 2 midnights and should be moved to inpatient because: Level of care      Consultants: None  Procedures: None  Antimicrobials: None  DVT prophylaxis: Lovenox   Objective: Vitals:   03/12/23 2226 03/12/23 2300 03/13/23 0430 03/13/23 1057  BP: (!) 153/78 (!) 154/90 (!) 127/52 (!) 150/70  Pulse: 70 71 64 (!) 59  Resp: 16  18   Temp: 98 F (36.7 C) 98 F (36.7 C) 98.4 F (36.9 C)   TempSrc:  Oral Oral   SpO2: 100% 97% 97% 98%    Intake/Output Summary (Last 24 hours) at 03/13/2023 1447 Last data filed at 03/13/2023 1200 Gross per 24 hour  Intake 238 ml  Output --  Net 238 ml   There were no vitals filed for this visit.  Exam: General: NAD  Cardiovascular: S1, S2 present Respiratory: CTAB Abdomen: Soft, nontender, nondistended, bowel sounds present Musculoskeletal: No bilateral pedal edema noted Skin: Normal Psychiatry: Normal mood    Data Reviewed: CBC: Recent Labs  Lab 03/12/23 1232 03/13/23 0421  WBC 10.4 7.5  HGB 13.5 12.9  HCT 38.6 37.1  MCV 88.9 90.5  PLT 290 [id number]   Basic Metabolic Panel: Recent Labs  Lab 03/12/23 1232 03/13/23 0421  NA 137 137  K 3.0* 3.4*  CL 104 106  CO2 23 24  GLUCOSE 118* 115*  BUN 12 8  CREATININE 0.86 0.75  CALCIUM 9.3 8.9  MG  --  2.2  PHOS  --  3.7   GFR: CrCl cannot be calculated (Unknown ideal weight.). Liver Function Tests: Recent Labs  Lab 03/12/23 1232  AST 25  ALT 18  ALKPHOS 71  BILITOT 1.2  PROT 6.9  ALBUMIN 4.1  No results for input(s): "LIPASE", "AMYLASE" in the last 168 hours. No results for input(s): "AMMONIA" in the last 168 hours. Coagulation Profile: No results for input(s): "INR", "PROTIME" in the last 168 hours. Cardiac Enzymes: No results for input(s): "CKTOTAL", "CKMB", "CKMBINDEX", "TROPONINI" in the last 168 hours. BNP (last 3 results) No results for input(s): "PROBNP" in the last 8760 hours. HbA1C: No results for input(s): "HGBA1C" in the last 72 hours. CBG: Recent  Labs  Lab 03/12/23 1245  GLUCAP 111*   Lipid Profile: No results for input(s): "CHOL", "HDL", "LDLCALC", "TRIG", "CHOLHDL", "LDLDIRECT" in the last 72 hours. Thyroid Function Tests: No results for input(s): "TSH", "T4TOTAL", "FREET4", "T3FREE", "THYROIDAB" in the last 72 hours. Anemia Panel: No results for input(s): "VITAMINB12", "FOLATE", "FERRITIN", "TIBC", "IRON", "RETICCTPCT" in the last 72 hours. Urine analysis:    Component Value Date/Time   COLORURINE YELLOW 06/25/2015 1628   APPEARANCEUR Cloudy (A) 11/29/2022 0000   LABSPEC 1.007 06/25/2015 1628   PHURINE 7.0 06/25/2015 1628   GLUCOSEU Negative 11/29/2022 0000   HGBUR NEGATIVE 06/25/2015 1628   BILIRUBINUR Negative 11/29/2022 0000   KETONESUR NEGATIVE 06/25/2015 1628   PROTEINUR Negative 11/29/2022 0000   PROTEINUR NEGATIVE 06/25/2015 1628   UROBILINOGEN 0.2 06/25/2015 1628   NITRITE Negative 11/29/2022 0000   NITRITE NEGATIVE 06/25/2015 1628   LEUKOCYTESUR 2+ (A) 11/29/2022 0000   Sepsis Labs: @LABRCNTIP (procalcitonin:4,lacticidven:4)  )No results found for this or any previous visit (from the past 240 hour(s)).    Studies: No results found.  Scheduled Meds:  amLODipine  10 mg Oral Daily   buPROPion  150 mg Oral Daily   carbidopa-levodopa  1 tablet Oral TID   donepezil  10 mg Oral QHS   enoxaparin (LOVENOX) injection  40 mg Subcutaneous Q24H   memantine  10 mg Oral BID   montelukast  10 mg Oral QHS   rosuvastatin  20 mg Oral Daily    Continuous Infusions:   LOS: 0 days     Alma Friendly, MD [facility]  If 7PM-7AM, please contact night-coverage [email or web address] 03/13/2023, 2:47 PM

## 2023-03-13 NOTE — Progress Notes (Signed)
Chaplain responded to [facility] consult for prayer. Chaplain introduced spiritual care and offered support during hospitalization. Chaplain asked open ended questions to facilitate emotional expression and story telling. Shelly Bennett shared that she is supposed to have a test at 2:00 which will hopefully give her more information about the weakness and numbness she's been experiencing. Shelly Bennett shared that her daily life has been significantly impacted by declining health over the last several weeks. She and her husband are no longer able to sleep in the same bed, she cannot climb the stairs to participate in her church or community choir, she cannot play with her grandchildren in the same way, and is unable to dress in her usual clothes. She does not feel like herself anymore and is disconnected from her typical faith practices. Shelly Bennett continues to name that she needs to be faithful and trust God, but with tears in her eyes. Chaplain utilized reflective listening to explore Shelly Bennett's perception of what it means to be a faithful Christian. She shared that her mother served others without regard for self and accepted a number of tragic circumstances quickly. Chaplain offered an invitation to reframe her perspective exploring Biblical (pt is a [place]) examples of anger, uncertainty, overwhelm, doubt, and worry as parts of stories of faith. Together we explored the indicators of humanity and the opportunities they give to develop deeper faith. We also explored coping strategies for her upcoming procedure and chaplain normalized the use of medical interventions in support of anxiety in addition to non-medical.   Please page as further needs arise.  Shelly Bennett. Elyn Peers, M.Div. [facility] Chaplain Pager [phone] Office [phone]

## 2023-03-13 NOTE — Care Management Obs Status (Cosign Needed)
[place] NOTIFICATION   Patient Details  Name: Shelly Bennett MRN: [id number] Date of Birth: 1951/07/26   Medicare Observation Status Notification Given:  Yes    Curlene Labrum, RN 03/13/2023, 11:19 AM

## 2023-03-13 NOTE — H&P (Signed)
History and Physical  Shelly Bennett [id number] DOB: September 15, 1951 DOA: 03/12/2023  Referring physician: Accepted by Dr. Maylene Roes, [facility], hospitalist service. PCP: Dettinger, Fransisca Kaufmann, MD  Outpatient Specialists: Neurology. Patient coming from: Home through [facility] ED  Chief Complaint: Falls  HPI: Shelly Bennett is a 72 y.o. female with medical history significant for chronic anxiety/depression, memory loss, hyperlipidemia, hypertension, coronary artery disease, retired Automotive engineer, who initially presented to [facility] ED due to frequent falls.  Associated with worsening gait abnormality.  Endorses she had a fall last night when she was trying to get into her car.  [address] and hit the back of her head, right shoulder, and buttocks.  She had another fall this morning while trying to get up and go to the bathroom.  Had some dizziness prior to the fall.  She landed on her buttocks and then fell backwards.  The patient was brought into the ED for further evaluation.  In the ED, imaging reveals no acute fracture.  Ambulated in the ED but too unstable on feet for discharge home.  PT OT evaluation and TOC consult were placed.  EDP requested admission.  The patient was admitted by [facility], hospitalist service, to [place] as observation status.  At the time of this visit, the patient complains of generalized minor aches.  ED Course: Tmax 98.3.  BP 144/90, pulse 71, respiratory rate 16, O2 saturation 97% on room air.  Lab studies markable for serum potassium 3.0, glucose 118.  CBC essentially unremarkable.  Review of Systems: Review of systems as noted in the HPI. All other systems reviewed and are negative.   Past Medical History:  Diagnosis Date   Adenomatous colon polyp    Allergic drug reaction 06/25/2015   Allergy    Anxiety    Arthritis    At risk for falls    fallen 4 times recently    CAD (coronary artery disease)    Dr. Burt Knack follows    Carpal tunnel syndrome    Chronic  headaches    Coccydynia 02/01/2012   Cystocele    Diverticulosis    External hemorrhoids    Focal nodular hyperplasia of liver    Gait abnormality    GERD (gastroesophageal reflux disease)    some   HCAP (healthcare-associated pneumonia) 06/25/2015   Heart murmur    HLD (hyperlipidemia)    HTN (hypertension)    IBS (irritable bowel syndrome)    Memory loss    Neuromuscular disorder ([place])    some neuropathy issues in the past- legs fibromyalgia in past-- past hx of steroid injections   Obesity    Osteoporosis    Otitis of left ear 02/01/2017   Pneumonia    Skin cancer    squamous cell - face   Past Surgical History:  Procedure Laterality Date   CARPAL TUNNEL RELEASE     COLONOSCOPY  12/15/2008   diverticulosis, external hemorrhoids   KNEE SURGERY  05/22/2015   right   LAPAROSCOPY     SKIN SURGERY     squamous cell - right face     Social History:  reports that she has never smoked. She has never used smokeless tobacco. She reports that she does not drink alcohol and does not use drugs.   Allergies  Allergen Reactions   Neomycin-Polymyxin-Dexameth     Other reaction(s): eye redness   Codeine     ? Reaction ER visit.    Mold Extract [Trichophyton]     Migraine  Penicillins Other (See Comments)    "drew my legs up as child" Pt has tolerated cephalexin in the past.   Ace Inhibitors Cough   Angiotensin Receptor Blockers Cough   Levaquin [Levofloxacin] Rash    Per notes from outpatient provider   Vytorin [Ezetimibe-Simvastatin] Other (See Comments)    cramping   Zetia [Ezetimibe] Other (See Comments)    cramping    Family History  Problem Relation Age of Onset   Hyperlipidemia Mother    Hypertension Mother    Kidney disease Mother    Osteoporosis Mother    Heart murmur Mother    Dementia Mother    Prostate cancer Father    Colon cancer Father 39   Kidney disease Father    Heart disease Father    Alzheimer's disease Father    Hyperlipidemia Sister     Hypertension Sister    Rectal cancer Maternal Grandmother 30   Heart disease Maternal Grandfather    Heart disease Brother 36       not dx questionable    Colon polyps Neg Hx    Esophageal cancer Neg Hx    Stomach cancer Neg Hx    Breast cancer Neg Hx       Prior to Admission medications   Medication Sig Start Date End Date Taking? Authorizing Provider  amLODipine (NORVASC) 10 MG tablet Take 1 tablet (10 mg total) by mouth daily. 09/18/22   Sherren Mocha, MD  aspirin 81 MG tablet Take 81 mg by mouth daily.     [provider]  buPROPion (WELLBUTRIN XL) 150 MG 24 hr tablet TAKE ONE TABLET ONCE DAILY 01/14/23   Dettinger, Fransisca Kaufmann, MD  Calcium Carbonate (CALCARB 600 PO) Take by mouth.    [provider]  carbidopa-levodopa (SINEMET IR) 25-100 MG tablet Take 1 tablet by mouth 3 (three) times daily. 01/24/23   Marcial Pacas, MD  Cholecalciferol (VITAMIN D-3) 5000 UNITS TABS Take 1 capsule by mouth as directed. Take one capsule by mouth daily Mon thru Friday    [provider]  donepezil (ARICEPT) 10 MG tablet Take 1 tablet (10 mg total) by mouth at bedtime. 12/31/22   Dettinger, Fransisca Kaufmann, MD  fexofenadine (ALLEGRA) 180 MG tablet Take 180 mg by mouth daily.    [provider]  fluticasone (FLONASE) 50 MCG/ACT nasal spray Place 2 sprays into both nostrils daily. Patient not taking: Reported on 02/06/2023 03/30/21   Dettinger, Fransisca Kaufmann, MD  hydrochlorothiazide (HYDRODIURIL) 25 MG tablet TAKE 1/2 TABLET DAILY 02/24/23   Dettinger, Fransisca Kaufmann, MD  HYDROcodone-acetaminophen (NORCO/VICODIN) 5-325 MG tablet Take 1 tablet by mouth every 6 (six) hours as needed for moderate pain. Patient not taking: Reported on 02/06/2023 04/27/20   Garald Balding, MD  memantine (NAMENDA) 10 MG tablet Take 1 tablet (10 mg total) by mouth 2 (two) times daily. 09/20/21   Dettinger, Fransisca Kaufmann, MD  montelukast (SINGULAIR) 10 MG tablet Take 1 tablet (10 mg total) by mouth at bedtime. 01/14/23    Dettinger, Fransisca Kaufmann, MD  omega-3 acid ethyl esters (LOVAZA) 1 g capsule TAKE TWO CAPSULES TWICE DAILY 12/11/22   Dettinger, Fransisca Kaufmann, MD  rosuvastatin (CRESTOR) 20 MG tablet TAKE ONE TABLET ONCE DAILY 02/24/23   Dettinger, Fransisca Kaufmann, MD  SF 5000 PLUS 1.1 % CREA dental cream Take by mouth as directed. Patient not taking: Reported on 02/06/2023 06/28/22   [provider]    Physical Exam: BP (!) 154/90   Pulse 71  Temp 98 F (36.7 C) (Oral)   Resp 16   SpO2 97%   General: 72 y.o. year-old female well developed well nourished in no acute distress.  Alert and interactive. Cardiovascular: Regular rate and rhythm with no rubs or gallops.  No thyromegaly or JVD noted.  No lower extremity edema. 2/4 pulses in all 4 extremities. Respiratory: Clear to auscultation with no wheezes or rales. Good inspiratory effort. Abdomen: Soft nontender nondistended with normal bowel sounds x4 quadrants. Muskuloskeletal: No cyanosis, clubbing or edema noted bilaterally Neuro: CN II-XII intact, strength, sensation, reflexes Skin: No ulcerative lesions noted or rashes Psychiatry: Judgement and insight appear normal. Mood is appropriate for condition and setting          Labs on Admission:  Basic Metabolic Panel: Recent Labs  Lab 03/12/23 1232  NA 137  K 3.0*  CL 104  CO2 23  GLUCOSE 118*  BUN 12  CREATININE 0.86  CALCIUM 9.3   Liver Function Tests: Recent Labs  Lab 03/12/23 1232  AST 25  ALT 18  ALKPHOS 71  BILITOT 1.2  PROT 6.9  ALBUMIN 4.1   No results for input(s): "LIPASE", "AMYLASE" in the last 168 hours. No results for input(s): "AMMONIA" in the last 168 hours. CBC: Recent Labs  Lab 03/12/23 1232  WBC 10.4  HGB 13.5  HCT 38.6  MCV 88.9  PLT 290   Cardiac Enzymes: No results for input(s): "CKTOTAL", "CKMB", "CKMBINDEX", "TROPONINI" in the last 168 hours.  BNP (last 3 results) Recent Labs    11/29/22 1216  BNP 20.4    ProBNP (last 3 results) No results for  input(s): "PROBNP" in the last 8760 hours.  CBG: Recent Labs  Lab 03/12/23 1245  GLUCAP 111*    Radiological Exams on Admission: CT Head Wo Contrast  Result Date: 03/12/2023 CLINICAL DATA:  Frequent falls EXAM: CT HEAD WITHOUT CONTRAST CT CERVICAL SPINE WITHOUT CONTRAST TECHNIQUE: Multidetector CT imaging of the head and cervical spine was performed following the standard protocol without intravenous contrast. Multiplanar CT image reconstructions of the cervical spine were also generated. RADIATION DOSE REDUCTION: This exam was performed according to the departmental dose-optimization program which includes automated exposure control, adjustment of the mA and/or kV according to patient size and/or use of iterative reconstruction technique. COMPARISON:  MR head 11/29/2022, cervical spine MRI 03/04/2023 FINDINGS: CT HEAD FINDINGS Brain: There is no acute intracranial hemorrhage, extra-axial fluid collection, or acute infarct. There is unchanged background parenchymal volume loss with prominence of the ventricular system and extra-axial CSF spaces. Foci of hypodensity in the supratentorial white matter likely reflect sequela of mild chronic small vessel ischemic change. An empty sella is noted, nonspecific. The suprasellar region is normal. There is no mass lesion. There is no mass effect or midline shift. Vascular: There is calcification of the bilateral carotid siphons. Skull: Normal. Negative for fracture or focal lesion. Sinuses/Orbits: The paranasal sinuses are clear. The globes and orbits are unremarkable. Other: None. CT CERVICAL SPINE FINDINGS Alignment: Normal. Skull base and vertebrae: Skull base alignment is maintained. Vertebral body heights are preserved. There is no evidence of acute fracture. There is no suspicious osseous lesion. Soft tissues and spinal canal: No prevertebral fluid or swelling. No visible canal hematoma. Disc levels: There is disc space narrowing degenerative endplate change  throughout the cervical spine, most advanced at C3-C4 through C5-C6 there is overall mild facet arthropathy. Multilevel spinal canal and neural foraminal stenosis is detailed on the report from the recent cervical spine MRI.  Upper chest: The imaged lung apices are clear. Other: None. IMPRESSION: 1. No acute intracranial pathology. 2. No acute fracture or traumatic malalignment of the cervical spine. Electronically Signed   By: Valetta Mole M.D.   On: 03/12/2023 13:47   CT Cervical Spine Wo Contrast  Result Date: 03/12/2023 CLINICAL DATA:  Frequent falls EXAM: CT HEAD WITHOUT CONTRAST CT CERVICAL SPINE WITHOUT CONTRAST TECHNIQUE: Multidetector CT imaging of the head and cervical spine was performed following the standard protocol without intravenous contrast. Multiplanar CT image reconstructions of the cervical spine were also generated. RADIATION DOSE REDUCTION: This exam was performed according to the departmental dose-optimization program which includes automated exposure control, adjustment of the mA and/or kV according to patient size and/or use of iterative reconstruction technique. COMPARISON:  MR head 11/29/2022, cervical spine MRI 03/04/2023 FINDINGS: CT HEAD FINDINGS Brain: There is no acute intracranial hemorrhage, extra-axial fluid collection, or acute infarct. There is unchanged background parenchymal volume loss with prominence of the ventricular system and extra-axial CSF spaces. Foci of hypodensity in the supratentorial white matter likely reflect sequela of mild chronic small vessel ischemic change. An empty sella is noted, nonspecific. The suprasellar region is normal. There is no mass lesion. There is no mass effect or midline shift. Vascular: There is calcification of the bilateral carotid siphons. Skull: Normal. Negative for fracture or focal lesion. Sinuses/Orbits: The paranasal sinuses are clear. The globes and orbits are unremarkable. Other: None. CT CERVICAL SPINE FINDINGS Alignment:  Normal. Skull base and vertebrae: Skull base alignment is maintained. Vertebral body heights are preserved. There is no evidence of acute fracture. There is no suspicious osseous lesion. Soft tissues and spinal canal: No prevertebral fluid or swelling. No visible canal hematoma. Disc levels: There is disc space narrowing degenerative endplate change throughout the cervical spine, most advanced at C3-C4 through C5-C6 there is overall mild facet arthropathy. Multilevel spinal canal and neural foraminal stenosis is detailed on the report from the recent cervical spine MRI. Upper chest: The imaged lung apices are clear. Other: None. IMPRESSION: 1. No acute intracranial pathology. 2. No acute fracture or traumatic malalignment of the cervical spine. Electronically Signed   By: Valetta Mole M.D.   On: 03/12/2023 13:47   DG Lumbar Spine 2-3 Views  Result Date: 03/12/2023 CLINICAL DATA:  Falls. EXAM: LUMBAR SPINE - 2-3 VIEW COMPARISON:  None Available. FINDINGS: Mild L1 compression deformity is noted consistent with old fracture. No acute fracture or spondylolisthesis is noted. Mild degenerative disc disease is noted at L1-2, L2-3 and L3-4 with anterior osteophyte formation. IMPRESSION: Mild multilevel degenerative disc disease. No acute abnormality seen. Aortic Atherosclerosis (ICD10-I70.0). Electronically Signed   By: Marijo Conception M.D.   On: 03/12/2023 13:43   DG Hips Bilat W or Wo Pelvis 3-4 Views  Result Date: 03/12/2023 CLINICAL DATA:  Fall. EXAM: DG HIP (WITH OR WITHOUT PELVIS) 3-4V BILAT COMPARISON:  September 18, 2022.  September 21, 2022. FINDINGS: No fracture or dislocation is noted. Mild osteophyte formation is seen involving both hips. Sacroiliac joints are unremarkable. Stable large calcification seen projected over left ischial tuberosity consistent with old traumatic injury as described on prior MRI. 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 No acute abnormality seen. Electronically  Signed   By: Marijo Conception M.D.   On: 03/12/2023 13:40   DG Shoulder Right  Result Date: 03/12/2023 CLINICAL DATA:  Right shoulder pain after fall. EXAM: RIGHT SHOULDER - 2+ VIEW COMPARISON:  February 17, 2020. FINDINGS: There is  no evidence of fracture or dislocation. There is no evidence of arthropathy or other focal bone abnormality. Soft tissues are unremarkable. IMPRESSION: Negative. Electronically Signed   By: Marijo Conception M.D.   On: 03/12/2023 13:38    EKG: I independently viewed the EKG done and my findings are as followed: Sinus rhythm rate of 58.  Nonspecific ST-T changes.  QTc 440.  Assessment/Plan Present on Admission: **None**  Principal Problem:   Frequent falls  Frequent falls, unclear etiology PT OT assessment Fall precautions. Imaging revealed no acute fracture. TOC consulted to assist with DME's.  Cognitive impairment and parkinsonian features Resume home regimen. Continue fall precautions. Reorient as needed.  Chronic anxiety/depression Resume home regimen.  Hypertension Resume home oral antihypertensives. Closely monitor vital signs  Hyperlipidemia Resume home regimen   DVT prophylaxis: Subcu Lovenox daily  Code Status: Full code  Family Communication: None at bedside  Disposition Plan: Admitted to MedSurg unit  Consults called: None.  Admission status: Observation status   Status is: Observation    Kayleen Memos MD [facility] Pager [phone]  If 7PM-7AM, please contact night-coverage [email or web address] Password [facility]  03/13/2023, 12:57 AM

## 2023-03-13 NOTE — Evaluation (Signed)
Physical Therapy Evaluation Patient Details Name: Shelly Bennett MRN: [id number] DOB: [birth year] Today's Date: 03/13/2023  History of Present Illness  72 yo female presents to ED on 3/26 with falls hitting head, bilat LE weakness. PMH includes anxiety, depression, cognitive impairment with parkinsonian features, HTN, HLD, CAD, osteoporosis.  Clinical Impression   Pt presents with generalized weakness L>R, impaired balance, severe back pain, shuffling gait, and decreased activity tolerance. Pt to benefit from acute PT to address deficits. Pt ambulated short hallway distance with shuffling gait pattern, typically uses rollator and pt is thrown off by RW given slower speed. Pt orthostatic vitals were negative for orthostatic hypotension, pt complains of near constant dizziness with mobility, nausea, and shortness of breath with SPO2 100% on RA. PT anticipates pt's anxiety is heightened presently, pt admits she has "bad" anxiety. PT to progress mobility as tolerated, and will continue to follow acutely.         Recommendations for follow up therapy are one component of a multi-disciplinary discharge planning process, led by the attending physician.  Recommendations may be updated based on patient status, additional functional criteria and insurance authorization.  Follow Up Recommendations       Assistance Recommended at Discharge Frequent or constant Supervision/Assistance  Patient can return home with the following  A little help with walking and/or transfers;A little help with bathing/dressing/bathroom    Equipment Recommendations None recommended by PT  Recommendations for Other Services       Functional Status Assessment Patient has had a recent decline in their functional status and demonstrates the ability to make significant improvements in function in a reasonable and predictable amount of time.     Precautions / Restrictions Precautions Precautions:  Fall Restrictions Weight Bearing Restrictions: No      Mobility  Bed Mobility Overal bed mobility: Needs Assistance Bed Mobility: Rolling, Sidelying to Sit Rolling: Min assist Sidelying to sit: Min assist       General bed mobility comments: assist for trunk elevation off of bed, increased time and effort.    Transfers Overall transfer level: Needs assistance Equipment used: Rolling walker (2 wheels) Transfers: Sit to/from Stand Sit to Stand: Min assist           General transfer comment: light rise and steady assist    Ambulation/Gait Ambulation/Gait assistance: Min guard Gait Distance (Feet): 80 Feet Assistive device: Rolling walker (2 wheels) Gait Pattern/deviations: Step-through pattern, Decreased stride length, Trunk flexed, Shuffle Gait velocity: decr     General Gait Details: cues for increasing step length, upright posture  Stairs            Wheelchair Mobility    Modified Rankin (Stroke Patients Only)       Balance Overall balance assessment: Needs assistance, History of Falls Sitting-balance support: No upper extremity supported, Feet supported Sitting balance-Leahy Scale: Fair     Standing balance support: Bilateral upper extremity supported, During functional activity Standing balance-Leahy Scale: Poor Standing balance comment: reliant on external support                             Pertinent Vitals/Pain Pain Assessment Pain Assessment: Faces Faces Pain Scale: Hurts even more Pain Location: back Pain Descriptors / Indicators: Spasm, Sore Pain Intervention(s): Limited activity within patient's tolerance, Monitored during session, Repositioned    Home Living Family/patient expects to be discharged to:: Private residence Living Arrangements: Spouse/significant other Available Help at Discharge: Family;Available 24 hours/day Type of  Home: House Home Access: [place]: One level Home  Equipment: [place] (4 wheels);Shower seat;Hand held shower head      Prior Function Prior Level of Function : Needs assist;History of Falls (last six months)             Mobility Comments: pt reports using rollator for ambulation ADLs Comments: pt states her husband helps her in and out of the tub, and they share cooking/cleaning tasks     Hand Dominance   Dominant Hand: Right    Extremity/Trunk Assessment   Upper Extremity Assessment Upper Extremity Assessment: Defer to OT evaluation    Lower Extremity Assessment Lower Extremity Assessment: Generalized weakness;LLE deficits/detail LLE Deficits / Details: 3+/5 knee flexion, otherwise 4/5 throughout. Difficult to assess via formal MMT given back pain    Cervical / Trunk Assessment Cervical / Trunk Assessment: Normal  Communication   Communication: No difficulties  Cognition Arousal/Alertness: Awake/alert Behavior During Therapy: Anxious Overall Cognitive Status: History of cognitive impairments - at baseline                                 General Comments: documented history of cognitive impairment, primarily memory deficits.        General Comments      Exercises     Assessment/Plan    PT Assessment Patient needs continued PT services  PT Problem List Decreased strength;Decreased mobility;Decreased activity tolerance;Decreased balance;Decreased knowledge of use of DME;Pain;Decreased safety awareness;Decreased cognition       PT Treatment Interventions Therapeutic activities;DME instruction;Gait training;Therapeutic exercise;Patient/family education;Balance training;Neuromuscular re-education;Functional mobility training    PT Goals (Current goals can be found in the Care Plan section)  Acute Rehab PT Goals PT Goal Formulation: With patient Time For Goal Achievement: 03/27/23 Potential to Achieve Goals: Good    Frequency Min 3X/week     Co-evaluation               AM-PAC PT "6  Clicks" Mobility  Outcome Measure Help needed turning from your back to your side while in a flat bed without using bedrails?: A Little Help needed moving from lying on your back to sitting on the side of a flat bed without using bedrails?: A Little Help needed moving to and from a bed to a chair (including a wheelchair)?: A Little Help needed standing up from a chair using your arms (e.g., wheelchair or bedside chair)?: A Little Help needed to walk in hospital room?: A Little Help needed climbing 3-5 steps with a railing? : A Little 6 Click Score: 18    End of Session Equipment Utilized During Treatment: Gait belt Activity Tolerance: Patient tolerated treatment well Patient left: in chair;with call bell/phone within reach;with chair alarm set Nurse Communication: Mobility status PT Visit Diagnosis: Other abnormalities of gait and mobility (R26.89);History of falling (Z91.81)    Time: [id number] PT Time Calculation (min) (ACUTE ONLY): 35 min   Charges:   PT Evaluation $PT Eval Low Complexity: 1 Low PT Treatments $Therapeutic Activity: 8-22 mins        Stacie Glaze, PT DPT Acute Rehabilitation Services Pager [phone]  Office [phone]   [facility] Ruffin Pyo 03/13/2023, 9:59 AM

## 2023-03-13 NOTE — Evaluation (Signed)
Occupational Therapy Evaluation Patient Details Name: Shelly Bennett MRN: [id number] DOB: 09-02-1951 Today's Date: 03/13/2023   History of Present Illness 72 yo female presents to ED on 3/26 with falls hitting head, bilat LE weakness. PMH includes anxiety, depression, cognitive impairment with parkinsonian features, HTN, HLD, CAD, osteoporosis.   Clinical Impression   Shelly Bennett was evaluated s/p the above admission list. She is generally mod I at baseline. Upon evaluation she was limited by decreased activity tolerance, nausea and generalized anxiety. Overall she complete mobility and ADLs with generalized superivsion A with RW. Pt will benefit from continued acute OT services. Anticipate good progress acutely with no OT needs at follow up.       Recommendations for follow up therapy are one component of a multi-disciplinary discharge planning process, led by the attending physician.  Recommendations may be updated based on patient status, additional functional criteria and insurance authorization.   Assistance Recommended at Discharge PRN  Patient can return home with the following A little help with walking and/or transfers;A little help with bathing/dressing/bathroom;Assist for transportation;Help with stairs or ramp for entrance;Assistance with cooking/housework    Functional Status Assessment  Patient has had a recent decline in their functional status and demonstrates the ability to make significant improvements in function in a reasonable and predictable amount of time.  Equipment Recommendations  None recommended by OT       Precautions / Restrictions Precautions Precautions: Fall Restrictions Weight Bearing Restrictions: No      Mobility Bed Mobility Overal bed mobility: Needs Assistance Bed Mobility: Sit to Supine       Sit to supine: Supervision   General bed mobility comments: sitting EOB upon arrival    Transfers Overall transfer level: Needs  assistance Equipment used: Rolling walker (2 wheels) Transfers: Sit to/from Stand Sit to Stand: Supervision           General transfer comment: no physical assist required, pt with poor management of RW      Balance Overall balance assessment: Needs assistance Sitting-balance support: Feet supported Sitting balance-Leahy Scale: Fair     Standing balance support: Bilateral upper extremity supported, During functional activity Standing balance-Leahy Scale: Poor                             ADL either performed or assessed with clinical judgement   ADL Overall ADL's : Needs assistance/impaired Eating/Feeding: Independent;Sitting   Grooming: Supervision/safety;Standing   Upper Body Bathing: Set up;Sitting   Lower Body Bathing: Supervison/ safety;Sit to/from stand   Upper Body Dressing : Set up;Sitting   Lower Body Dressing: Supervision/safety;Sit to/from stand   Toilet Transfer: Supervision/safety;Ambulation;Rolling walker (2 wheels)   Toileting- Clothing Manipulation and Hygiene: Supervision/safety;Sitting/lateral lean       Functional mobility during ADLs: Supervision/safety;Rolling walker (2 wheels) General ADL Comments: generalized supervision A for safety only     Vision Baseline Vision/History: 0 No visual deficits Vision Assessment?: No apparent visual deficits     Perception Perception Perception Tested?: No   Praxis Praxis Praxis tested?: Not tested    Pertinent Vitals/Pain Pain Assessment Pain Assessment: Faces Faces Pain Scale: Hurts a little bit Pain Location: generalized, nausea Pain Descriptors / Indicators: Spasm, Sore Pain Intervention(s): Limited activity within patient's tolerance, Monitored during session     Hand Dominance Right   Extremity/Trunk Assessment Upper Extremity Assessment Upper Extremity Assessment: Generalized weakness   Lower Extremity Assessment Lower Extremity Assessment: Defer to PT evaluation  Cervical / Trunk Assessment Cervical / Trunk Assessment: Normal   Communication Communication Communication: No difficulties   Cognition Arousal/Alertness: Awake/alert Behavior During Therapy: Anxious Overall Cognitive Status: History of cognitive impairments - at baseline                                 General Comments: documented history of cognitive impairment, primarily memory deficits.     General Comments  VSS on RA, pt with emesis prior to my arrival    Exercises     Shoulder Instructions      Home Living Family/patient expects to be discharged to:: Private residence Living Arrangements: Spouse/significant other Available Help at Discharge: Family;Available 24 hours/day Type of Home: House Home Access: Ramped entrance     Home Layout: One level     Bathroom Shower/Tub: Teacher, early years/pre: Standard     Home Equipment: Rollator (4 wheels);Shower seat;Hand held shower head          Prior Functioning/Environment Prior Level of Function : Needs assist;History of Falls (last six months)             Mobility Comments: pt reports using rollator for ambulation ADLs Comments: pt states her husband helps her in and out of the tub, and they share cooking/cleaning tasks        OT Problem List: Decreased activity tolerance      OT Treatment/Interventions: Self-care/ADL training;Therapeutic exercise;DME and/or AE instruction;Therapeutic activities;Balance training;Patient/family education    OT Goals(Current goals can be found in the care plan section) Acute Rehab OT Goals Patient Stated Goal: to feel better OT Goal Formulation: With patient Time For Goal Achievement: 03/27/23 Potential to Achieve Goals: Good ADL Goals Additional ADL Goal #1: Pt will complete BADLs with mod I Additional ADL Goal #2: Pt will tolerate 10x minutes of functional OOB activity to demonstrate increased activity tolerance  OT Frequency: Min  2X/week       AM-PAC OT "6 Clicks" Daily Activity     Outcome Measure Help from another person eating meals?: None Help from another person taking care of personal grooming?: A Little Help from another person toileting, which includes using toliet, bedpan, or urinal?: A Little Help from another person bathing (including washing, rinsing, drying)?: A Little Help from another person to put on and taking off regular upper body clothing?: None Help from another person to put on and taking off regular lower body clothing?: A Little 6 Click Score: 20   End of Session Equipment Utilized During Treatment: Gait belt;Rolling walker (2 wheels) Nurse Communication: Mobility status  Activity Tolerance: Patient tolerated treatment well Patient left: in bed;with call bell/phone within reach;with bed alarm set  OT Visit Diagnosis: Unsteadiness on feet (R26.81);Other abnormalities of gait and mobility (R26.89);Muscle weakness (generalized) (M62.81)                Time: [id number] OT Time Calculation (min): 18 min Charges:  OT General Charges $OT Visit: 1 Visit OT Evaluation $OT Eval Moderate Complexity: [place], OTR/L [place] [facility] Walker Communication Preferred   Elliot Cousin 03/13/2023, 2:43 PM

## 2023-03-14 ENCOUNTER — Encounter: Payer: Medicare PPO | Admitting: Physical Therapy

## 2023-03-14 DIAGNOSIS — Z86010 Personal history of colonic polyps: Secondary | ICD-10-CM | POA: Diagnosis not present

## 2023-03-14 DIAGNOSIS — E876 Hypokalemia: Secondary | ICD-10-CM | POA: Diagnosis present

## 2023-03-14 DIAGNOSIS — Z885 Allergy status to narcotic agent status: Secondary | ICD-10-CM | POA: Diagnosis not present

## 2023-03-14 DIAGNOSIS — I2510 Atherosclerotic heart disease of native coronary artery without angina pectoris: Secondary | ICD-10-CM | POA: Diagnosis present

## 2023-03-14 DIAGNOSIS — K219 Gastro-esophageal reflux disease without esophagitis: Secondary | ICD-10-CM | POA: Diagnosis present

## 2023-03-14 DIAGNOSIS — G912 (Idiopathic) normal pressure hydrocephalus: Secondary | ICD-10-CM | POA: Diagnosis present

## 2023-03-14 DIAGNOSIS — F32A Depression, unspecified: Secondary | ICD-10-CM | POA: Diagnosis present

## 2023-03-14 DIAGNOSIS — Z800 Family history of malignant neoplasm of digestive organs: Secondary | ICD-10-CM | POA: Diagnosis not present

## 2023-03-14 DIAGNOSIS — R4189 Other symptoms and signs involving cognitive functions and awareness: Secondary | ICD-10-CM | POA: Diagnosis present

## 2023-03-14 DIAGNOSIS — Z85828 Personal history of other malignant neoplasm of skin: Secondary | ICD-10-CM | POA: Diagnosis not present

## 2023-03-14 DIAGNOSIS — E785 Hyperlipidemia, unspecified: Secondary | ICD-10-CM | POA: Diagnosis present

## 2023-03-14 DIAGNOSIS — M797 Fibromyalgia: Secondary | ICD-10-CM | POA: Diagnosis present

## 2023-03-14 DIAGNOSIS — Z820 Family history of epilepsy and other diseases of the nervous system: Secondary | ICD-10-CM | POA: Diagnosis not present

## 2023-03-14 DIAGNOSIS — K5900 Constipation, unspecified: Secondary | ICD-10-CM | POA: Diagnosis present

## 2023-03-14 DIAGNOSIS — R2689 Other abnormalities of gait and mobility: Secondary | ICD-10-CM | POA: Diagnosis present

## 2023-03-14 DIAGNOSIS — Z8262 Family history of osteoporosis: Secondary | ICD-10-CM | POA: Diagnosis not present

## 2023-03-14 DIAGNOSIS — Z841 Family history of disorders of kidney and ureter: Secondary | ICD-10-CM | POA: Diagnosis not present

## 2023-03-14 DIAGNOSIS — R413 Other amnesia: Secondary | ICD-10-CM | POA: Diagnosis present

## 2023-03-14 DIAGNOSIS — R296 Repeated falls: Secondary | ICD-10-CM | POA: Diagnosis present

## 2023-03-14 DIAGNOSIS — I10 Essential (primary) hypertension: Secondary | ICD-10-CM | POA: Diagnosis present

## 2023-03-14 DIAGNOSIS — Z8249 Family history of ischemic heart disease and other diseases of the circulatory system: Secondary | ICD-10-CM | POA: Diagnosis not present

## 2023-03-14 DIAGNOSIS — M810 Age-related osteoporosis without current pathological fracture: Secondary | ICD-10-CM | POA: Diagnosis present

## 2023-03-14 DIAGNOSIS — Z888 Allergy status to other drugs, medicaments and biological substances status: Secondary | ICD-10-CM | POA: Diagnosis not present

## 2023-03-14 DIAGNOSIS — W19XXXA Unspecified fall, initial encounter: Secondary | ICD-10-CM | POA: Diagnosis present

## 2023-03-14 DIAGNOSIS — Z83438 Family history of other disorder of lipoprotein metabolism and other lipidemia: Secondary | ICD-10-CM | POA: Diagnosis not present

## 2023-03-14 LAB — CBC WITH DIFFERENTIAL/PLATELET
Abs Immature Granulocytes: 0.03 10*3/uL (ref 0.00–0.07)
Basophils Absolute: 0.1 10*3/uL (ref 0.0–0.1)
Basophils Relative: 1 %
Eosinophils Absolute: 0.1 10*3/uL (ref 0.0–0.5)
Eosinophils Relative: 1 %
HCT: 42.1 % (ref 36.0–46.0)
Hemoglobin: 13.9 g/dL (ref 12.0–15.0)
Immature Granulocytes: 0 %
Lymphocytes Relative: 25 %
Lymphs Abs: 1.9 10*3/uL (ref 0.7–4.0)
MCH: 30.8 pg (ref 26.0–34.0)
MCHC: 33 g/dL (ref 30.0–36.0)
MCV: 93.1 fL (ref 80.0–100.0)
Monocytes Absolute: 0.4 10*3/uL (ref 0.1–1.0)
Monocytes Relative: 5 %
Neutro Abs: 5 10*3/uL (ref 1.7–7.7)
Neutrophils Relative %: 68 %
Platelets: 259 10*3/uL (ref 150–400)
RBC: 4.52 MIL/uL (ref 3.87–5.11)
RDW: 14.2 % (ref 11.5–15.5)
WBC: 7.4 10*3/uL (ref 4.0–10.5)
nRBC: 0 % (ref 0.0–0.2)

## 2023-03-14 LAB — BASIC METABOLIC PANEL
Anion gap: 12 (ref 5–15)
BUN: 9 mg/dL (ref 8–23)
CO2: 22 mmol/L (ref 22–32)
Calcium: 9.3 mg/dL (ref 8.9–10.3)
Chloride: 104 mmol/L (ref 98–111)
Creatinine, Ser: 0.8 mg/dL (ref 0.44–1.00)
GFR, Estimated: >60 mL/min (ref >60)
Glucose, Bld: 157 mg/dL — ABNORMAL HIGH (ref 70–99)
Potassium: 3.5 mmol/L (ref 3.5–5.1)
Sodium: 138 mmol/L (ref 135–145)

## 2023-03-14 MED ORDER — OXYCODONE HCL 5 MG PO TABS
5.0000 mg | ORAL_TABLET | Freq: Four times a day (QID) | ORAL | Status: DC | PRN
  Administered 2023-03-14 – 2023-03-15 (×3): 5 mg via ORAL
  Filled 2023-03-14 (×3): qty 1

## 2023-03-14 MED ORDER — POLYETHYLENE GLYCOL 3350 17 G PO PACK
17.0000 g | PACK | Freq: Two times a day (BID) | ORAL | Status: DC
  Administered 2023-03-14 – 2023-03-15 (×2): 17 g via ORAL
  Filled 2023-03-14 (×2): qty 1

## 2023-03-14 MED ORDER — SENNOSIDES-DOCUSATE SODIUM 8.6-50 MG PO TABS
1.0000 | ORAL_TABLET | Freq: Two times a day (BID) | ORAL | Status: DC
  Administered 2023-03-14 – 2023-03-15 (×2): 1 via ORAL
  Filled 2023-03-14 (×2): qty 1

## 2023-03-14 NOTE — Progress Notes (Signed)
PROGRESS NOTE  Shelly Bennett [id number] DOB: 1951/06/14 DOA: 03/12/2023 PCP: Dettinger, Fransisca Kaufmann, MD  HPI/Recap of past 24 hours: Shelly Bennett is a 72 y.o. female with medical history significant for chronic anxiety/depression, memory loss, hyperlipidemia, hypertension, coronary artery disease, who initially presented to [facility] ED due to frequent falls, associated with worsening gait abnormality.  Endorses multiple recent falls PTA. Had some dizziness prior to the fall.  She landed on her buttocks and then fell backwards. In the ED, VSS. Lab studies unremarkable except for serum potassium 3.0, imaging reveals no acute fracture.  Ambulated in the ED but unstable on feet for discharge home. Pt admitted for further management.     Today, met patient at bedside commode stating she is significantly impacted causing her so much discomfort. Ordered tap water enema with relief. Continues to c/o chronic back pain    Assessment/Plan: Principal Problem:   Frequent falls  Frequent falls Concern for possible neurodegenerative disorder as per outpatient neurologist, further workup in progress ?NPH Orthostatic vitals unremarkable Appears to have some parkinsonian features, including shuffling gait PT OT assessment Fall precautions Imaging revealed no acute fracture TOC consulted to assist with DME's  Hypokalemia Replace as needed   Cognitive impairment and parkinsonian features Resume home Sinemet Continue fall precautions Reorient as needed   Chronic anxiety/depression Resume home regimen   Hypertension Continue amlodipine   Hyperlipidemia Crestor  Constipation Bowel regimen      Estimated body mass index is 28.74 kg/m as calculated from the following:   Height as of 01/24/23: 5\' 8"  (1.727 m).   Weight as of 01/24/23: 85.7 kg.     Code Status: Full  Family Communication: None at bedside  Disposition Plan: Status is: Inpatient  The patient will require  care spanning > 2 midnights and should be moved to inpatient because: Level of care      Consultants: None  Procedures: None  Antimicrobials: None  DVT prophylaxis: Lovenox   Objective: Vitals:   03/14/23 0356 03/14/23 0806 03/14/23 1001 03/14/23 1548  BP: (!) 149/71 123/66 (!) 144/73 (!) 155/73  Pulse: 65 63 61 67  Resp: 18     Temp: 97.6 F (36.4 C) 97.7 F (36.5 C)  (!) 97.5 F (36.4 C)  TempSrc: Oral Oral  Oral  SpO2: 99% 100% 100% 100%    Intake/Output Summary (Last 24 hours) at 03/14/2023 1735 Last data filed at 03/14/2023 1001 Gross per 24 hour  Intake 120 ml  Output --  Net 120 ml   There were no vitals filed for this visit.  Exam: General: NAD  Cardiovascular: S1, S2 present Respiratory: CTAB Abdomen: Soft, nontender, nondistended, bowel sounds present Musculoskeletal: No bilateral pedal edema noted Skin: Normal Psychiatry: Normal mood    Data Reviewed: CBC: Recent Labs  Lab 03/12/23 1232 03/13/23 0421 03/14/23 0857  WBC 10.4 7.5 7.4  NEUTROABS  --   --  5.0  HGB 13.5 12.9 13.9  HCT 38.6 37.1 42.1  MCV 88.9 90.5 93.1  PLT 290 235 [id number]   Basic Metabolic Panel: Recent Labs  Lab 03/12/23 1232 03/13/23 0421 03/14/23 0857  NA 137 137 138  K 3.0* 3.4* 3.5  CL 104 106 104  CO2 23 24 22   GLUCOSE 118* 115* 157*  BUN 12 8 9   CREATININE 0.86 0.75 0.80  CALCIUM 9.3 8.9 9.3  MG  --  2.2  --   PHOS  --  3.7  --    GFR: CrCl cannot be  calculated (Unknown ideal weight.). Liver Function Tests: Recent Labs  Lab 03/12/23 1232  AST 25  ALT 18  ALKPHOS 71  BILITOT 1.2  PROT 6.9  ALBUMIN 4.1   No results for input(s): "LIPASE", "AMYLASE" in the last 168 hours. No results for input(s): "AMMONIA" in the last 168 hours. Coagulation Profile: No results for input(s): "INR", "PROTIME" in the last 168 hours. Cardiac Enzymes: No results for input(s): "CKTOTAL", "CKMB", "CKMBINDEX", "TROPONINI" in the last 168 hours. BNP (last 3  results) No results for input(s): "PROBNP" in the last 8760 hours. HbA1C: No results for input(s): "HGBA1C" in the last 72 hours. CBG: Recent Labs  Lab 03/12/23 1245  GLUCAP 111*   Lipid Profile: No results for input(s): "CHOL", "HDL", "LDLCALC", "TRIG", "CHOLHDL", "LDLDIRECT" in the last 72 hours. Thyroid Function Tests: No results for input(s): "TSH", "T4TOTAL", "FREET4", "T3FREE", "THYROIDAB" in the last 72 hours. Anemia Panel: No results for input(s): "VITAMINB12", "FOLATE", "FERRITIN", "TIBC", "IRON", "RETICCTPCT" in the last 72 hours. Urine analysis:    Component Value Date/Time   COLORURINE YELLOW 06/25/2015 1628   APPEARANCEUR Cloudy (A) 11/29/2022 0000   LABSPEC 1.007 06/25/2015 1628   PHURINE 7.0 06/25/2015 1628   GLUCOSEU Negative 11/29/2022 0000   HGBUR NEGATIVE 06/25/2015 1628   BILIRUBINUR Negative 11/29/2022 0000   KETONESUR NEGATIVE 06/25/2015 1628   PROTEINUR Negative 11/29/2022 0000   PROTEINUR NEGATIVE 06/25/2015 1628   UROBILINOGEN 0.2 06/25/2015 1628   NITRITE Negative 11/29/2022 0000   NITRITE NEGATIVE 06/25/2015 1628   LEUKOCYTESUR 2+ (A) 11/29/2022 0000   Sepsis Labs: @LABRCNTIP (procalcitonin:4,lacticidven:4)  )No results found for this or any previous visit (from the past 240 hour(s)).    Studies: No results found.  Scheduled Meds:  amLODipine  10 mg Oral Daily   buPROPion  150 mg Oral Daily   carbidopa-levodopa  1 tablet Oral TID   donepezil  10 mg Oral QHS   enoxaparin (LOVENOX) injection  40 mg Subcutaneous Q24H   memantine  10 mg Oral BID   montelukast  10 mg Oral QHS   polyethylene glycol  17 g Oral Daily   rosuvastatin  20 mg Oral Daily    Continuous Infusions:   LOS: 0 days     Alma Friendly, MD [facility]  If 7PM-7AM, please contact night-coverage [email or web address] 03/14/2023, 5:35 PM

## 2023-03-14 NOTE — Plan of Care (Signed)
Alert and oriented x4 during the shift. Up with standby/min assist to bedside commode with walker. Patient deferred miralax, as she did not want to be up during the night using the restroom. Gave oxy x1 dose in 8pm hour. Gave dilaudid x1 dose at 0350. Patient visualized before hand off, chest rise and fall noted at 0637.    Problem: Education: Goal: Knowledge of General Education information will improve Description: Including pain rating scale, medication(s)/side effects and non-pharmacologic comfort measures Outcome: Progressing   Problem: Health Behavior/Discharge Planning: Goal: Ability to manage health-related needs will improve Outcome: Progressing   Problem: Clinical Measurements: Goal: Ability to maintain clinical measurements within normal limits will improve Outcome: Progressing Goal: Will remain free from infection Outcome: Progressing Goal: Diagnostic test results will improve Outcome: Progressing Goal: Respiratory complications will improve Outcome: Progressing Goal: Cardiovascular complication will be avoided Outcome: Progressing   Problem: Activity: Goal: Risk for activity intolerance will decrease Outcome: Progressing   Problem: Nutrition: Goal: Adequate nutrition will be maintained Outcome: Progressing   Problem: Coping: Goal: Level of anxiety will decrease Outcome: Progressing   Problem: Elimination: Goal: Will not experience complications related to bowel motility Outcome: Progressing Goal: Will not experience complications related to urinary retention Outcome: Progressing   Problem: Pain Managment: Goal: General experience of comfort will improve Outcome: Progressing   Problem: Safety: Goal: Ability to remain free from injury will improve Outcome: Progressing   Problem: Skin Integrity: Goal: Risk for impaired skin integrity will decrease Outcome: Progressing

## 2023-03-15 DIAGNOSIS — R296 Repeated falls: Secondary | ICD-10-CM | POA: Diagnosis not present

## 2023-03-15 LAB — BASIC METABOLIC PANEL
Anion gap: 11 (ref 5–15)
BUN: 9 mg/dL (ref 8–23)
CO2: 22 mmol/L (ref 22–32)
Calcium: 9.4 mg/dL (ref 8.9–10.3)
Chloride: 106 mmol/L (ref 98–111)
Creatinine, Ser: 0.71 mg/dL (ref 0.44–1.00)
GFR, Estimated: >60 mL/min (ref >60)
Glucose, Bld: 99 mg/dL (ref 70–99)
Potassium: 4.1 mmol/L (ref 3.5–5.1)
Sodium: 139 mmol/L (ref 135–145)

## 2023-03-15 MED ORDER — SENNOSIDES-DOCUSATE SODIUM 8.6-50 MG PO TABS: 1.0000 | ORAL_TABLET | Freq: Two times a day (BID) | ORAL | Status: AC

## 2023-03-15 MED ORDER — SORBITOL 70 % SOLN
960.0000 mL | TOPICAL_OIL | Freq: Once | ORAL | Status: AC
  Administered 2023-03-15: 960 mL via RECTAL
  Filled 2023-03-15: qty 240

## 2023-03-15 MED ORDER — POLYETHYLENE GLYCOL 3350 17 G PO PACK: 17.0000 g | PACK | Freq: Two times a day (BID) | ORAL | 0 refills | Status: AC

## 2023-03-15 MED ORDER — HYDROCODONE-ACETAMINOPHEN 5-325 MG PO TABS: 1.0000 | ORAL_TABLET | Freq: Four times a day (QID) | ORAL | 0 refills | Status: AC | PRN

## 2023-03-15 NOTE — Progress Notes (Signed)
Physical Therapy Treatment Patient Details Name: Shelly Bennett MRN: [id number] DOB: Dec 07, 1951 Today's Date: 03/15/2023   History of Present Illness 72 yo female presents to ED on 3/26 with falls hitting head, bilat LE weakness. PMH includes anxiety, depression, cognitive impairment with parkinsonian features, HTN, HLD, CAD, osteoporosis.    PT Comments    Pt eager to mobilize, ambulated well in hallway with use of rollator. Pt initially using RW, PT transitioned pt to rollator given halting, short steps. Pt requires cues for increasing step length and safety with rollator throughout mobility. Pt fatigues with continued mobility, but knows when to rest and recover. PT to continue to follow.      Recommendations for follow up therapy are one component of a multi-disciplinary discharge planning process, led by the attending physician.  Recommendations may be updated based on patient status, additional functional criteria and insurance authorization.  Follow Up Recommendations       Assistance Recommended at Discharge Frequent or constant Supervision/Assistance  Patient can return home with the following A little help with walking and/or transfers;A little help with bathing/dressing/bathroom   Equipment Recommendations  None recommended by PT    Recommendations for Other Services       Precautions / Restrictions Precautions Precautions: Fall Restrictions Weight Bearing Restrictions: No     Mobility  Bed Mobility Overal bed mobility: Needs Assistance Bed Mobility: Supine to Sit, Sit to Supine     Supine to sit: Min assist Sit to supine: Min assist        Transfers Overall transfer level: Needs assistance Equipment used: Rolling walker (2 wheels) Transfers: Sit to/from Stand Sit to Stand: Min guard           General transfer comment: for safety, cues for hand placement. sit<>stand x3, from EOB and toilet and rollator    Ambulation/Gait Ambulation/Gait  assistance: Min guard Gait Distance (Feet): 250 Feet (x2 - seated rest break) Assistive device: Rollator (4 wheels) Gait Pattern/deviations: Step-through pattern, Decreased stride length, Trunk flexed, Shuffle Gait velocity: decr     General Gait Details: cues for increasing foot clearance, lengthening step length. Increasing festinating and unsteadiness with fatigue   Stairs             Wheelchair Mobility    Modified Rankin (Stroke Patients Only)       Balance Overall balance assessment: Needs assistance Sitting-balance support: Feet supported Sitting balance-Leahy Scale: Fair     Standing balance support: Bilateral upper extremity supported, During functional activity, Reliant on assistive device for balance Standing balance-Leahy Scale: Poor                              Cognition Arousal/Alertness: Awake/alert Behavior During Therapy: Anxious Overall Cognitive Status: History of cognitive impairments - at baseline                                 General Comments: documented history of cognitive impairment, primarily memory deficits.        Exercises      General Comments        Pertinent Vitals/Pain Pain Assessment Pain Assessment: Faces Faces Pain Scale: Hurts even more Pain Descriptors / Indicators: Spasm, Sore Pain Intervention(s): Limited activity within patient's tolerance, Monitored during session, Repositioned    Home Living  Prior Function            PT Goals (current goals can now be found in the care plan section) Acute Rehab PT Goals PT Goal Formulation: With patient Time For Goal Achievement: 03/27/23 Potential to Achieve Goals: Good Progress towards PT goals: Progressing toward goals    Frequency    Min 3X/week      PT Plan Current plan remains appropriate    Co-evaluation              AM-PAC PT "6 Clicks" Mobility   Outcome Measure  Help needed  turning from your back to your side while in a flat bed without using bedrails?: A Little Help needed moving from lying on your back to sitting on the side of a flat bed without using bedrails?: A Little Help needed moving to and from a bed to a chair (including a wheelchair)?: A Little Help needed standing up from a chair using your arms (e.g., wheelchair or bedside chair)?: A Little Help needed to walk in hospital room?: A Little Help needed climbing 3-5 steps with a railing? : A Lot 6 Click Score: 17    End of Session   Activity Tolerance: Patient tolerated treatment well Patient left: in bed;with call bell/phone within reach;with bed alarm set Nurse Communication: Mobility status PT Visit Diagnosis: Other abnormalities of gait and mobility (R26.89);History of falling (Z91.81)     Time: 1340-1409 PT Time Calculation (min) (ACUTE ONLY): 29 min  Charges:  $Gait Training: 8-22 mins $Therapeutic Activity: 8-22 mins                     Stacie Glaze, PT DPT Acute Rehabilitation Services Pager [phone]  Office [phone]    Louis Matte 03/15/2023, 3:44 PM

## 2023-03-15 NOTE — Plan of Care (Signed)

## 2023-03-16 NOTE — Discharge Summary (Signed)
Physician Discharge Summary   Patient: Clemmie Dejong MRN: [id number] DOB: 1951-09-11  Admit date:     03/12/2023  Discharge date: 03/15/2023  Discharge Physician: Alma Friendly   PCP: Dettinger, Fransisca Kaufmann, MD   Recommendations at discharge:   Follow-up with PCP in 1 week Follow-up with neurology as scheduled  Discharge Diagnoses: Principal Problem:   Frequent falls    Hospital Course: Stefani Gritter is a 72 y.o. female with medical history significant for chronic anxiety/depression, memory loss, hyperlipidemia, hypertension, coronary artery disease, who initially presented to [facility] ED due to frequent falls, associated with worsening gait abnormality.  Endorses multiple recent falls PTA. Had some dizziness prior to the fall.  She landed on her buttocks and then fell backwards. In the ED, VSS. Lab studies unremarkable except for serum potassium 3.0, imaging reveals no acute fracture.  Ambulated in the ED but unstable on feet for discharge home. Pt admitted for further management.     Spoke to daughter in details about overall care and the need to follow up with outpatient neurologist Dr Krista Blue.  Attempted to contact Dr. Krista Blue via secure chat, but office was closed for the holiday weekend.  Patient had remained stable throughout the hospital stay, no new/worsening changes when noted by myself and family.  Daughter works here at [employer].  Ensured she would make a close follow-up with Dr. Krista Blue and patient's primary care as well.    Assessment and Plan:  Frequent falls Concern for possible neurodegenerative disorder as per outpatient neurologist, further workup in progress ?NPH Orthostatic vitals unremarkable Appears to have some parkinsonian features, including shuffling gait PT OT assessment- continue with outpatient PT Fall precautions Imaging revealed no acute fracture TOC consulted to assist with DME's   Hypokalemia Replaced as needed   Cognitive impairment and  parkinsonian features Resume home Sinemet (daughter stopped it due to side effects, told her to discuss with Dr Krista Blue) Continue fall precautions Reorient as needed   Chronic anxiety/depression Resume home regimen   Hypertension Continue amlodipine   Hyperlipidemia Crestor   Constipation Bowel regimen     Consultants: None Procedures performed: None Disposition: Home Diet recommendation:  Cardiac diet    DISCHARGE MEDICATION: Allergies as of 03/15/2023       Reactions   Neomycin-polymyxin-dexameth    Other reaction(s): eye redness   Codeine    ? Reaction ER visit.    Mold Extract [trichophyton]    Migraine   Penicillins Other (See Comments)   "drew my legs up as child" Pt has tolerated cephalexin in the past.   Ace Inhibitors Cough   Angiotensin Receptor Blockers Cough   Levaquin [levofloxacin] Rash   Per notes from outpatient provider   Vytorin [ezetimibe-simvastatin] Other (See Comments)   cramping   Zetia [ezetimibe] Other (See Comments)   cramping        Medication List     STOP taking these medications    fluticasone 50 MCG/ACT nasal spray Commonly known as: FLONASE       TAKE these medications    amLODipine 10 MG tablet Commonly known as: NORVASC Take 1 tablet (10 mg total) by mouth daily.   aspirin 81 MG tablet Take 81 mg by mouth daily.   buPROPion 150 MG 24 hr tablet Commonly known as: WELLBUTRIN XL TAKE ONE TABLET ONCE DAILY   carbidopa-levodopa 25-100 MG tablet Commonly known as: SINEMET IR Take 1 tablet by mouth 3 (three) times daily.   donepezil 10 MG tablet Commonly known  as: ARICEPT Take 1 tablet (10 mg total) by mouth at bedtime.   hydrochlorothiazide 25 MG tablet Commonly known as: HYDRODIURIL TAKE 1/2 TABLET DAILY What changed:  how much to take how to take this when to take this   HYDROcodone-acetaminophen 5-325 MG tablet Commonly known as: NORCO/VICODIN Take 1 tablet by mouth every 6 (six) hours as needed  for up to 7 days for moderate pain.   memantine 10 MG tablet Commonly known as: NAMENDA Take 1 tablet (10 mg total) by mouth 2 (two) times daily.   montelukast 10 MG tablet Commonly known as: SINGULAIR Take 1 tablet (10 mg total) by mouth at bedtime.   omega-3 acid ethyl esters 1 g capsule Commonly known as: LOVAZA TAKE TWO CAPSULES TWICE DAILY What changed: See the new instructions.   polyethylene glycol 17 g packet Commonly known as: MIRALAX / GLYCOLAX Take 17 g by mouth 2 (two) times daily.   rosuvastatin 20 MG tablet Commonly known as: CRESTOR TAKE ONE TABLET ONCE DAILY   senna-docusate 8.6-50 MG tablet Commonly known as: Senokot-S Take 1 tablet by mouth 2 (two) times daily.   Vitamin D-3 125 MCG (5000 UT) Tabs Take 1 capsule by mouth See admin instructions. Take one capsule by mouth daily Mon thru Friday        Follow-up [facility] [place]. Call.   Specialty: Rehabilitation Why: Please follow up with the [facility] in [place] for continued services once you are discharged home from the hospital. Contact information: [address] [id number] [place] [phone]        Dettinger, Fransisca Kaufmann, MD. Schedule an appointment as soon as possible for a visit.   Specialties: Family Medicine, Cardiology Why: Please make an appointment and follow up with your primary care physician in the next 7-10 days after discharged home from the hospital. Contact information: [place] [address] [phone]                Discharge Exam: There were no vitals filed for this visit. General: NAD  Cardiovascular: S1, S2 present Respiratory: CTAB Abdomen: Soft, nontender, nondistended, bowel sounds present Musculoskeletal: No bilateral pedal edema noted Skin: Normal Psychiatry: Normal mood   Condition at discharge: stable  The results of significant diagnostics  from this hospitalization (including imaging, microbiology, ancillary and laboratory) are listed below for reference.   Imaging Studies: NM BRAIN DATSCAN TUMOR LOC INFLAM SPECT 1 DAY  Result Date: 03/15/2023 CLINICAL DATA:  72 year old female with gait disturbance and some cognitive impairment. EXAM: NUCLEAR MEDICINE BRAIN IMAGING WITH SPECT  (DaTscan ) TECHNIQUE: SPECT images of the brain were obtained after intravenous injection of radiopharmaceutical. 4 hour post injection imaging. Appropriate positioning. 130 mg IO STAT given orally for thyroid blockade. RADIOPHARMACEUTICALS:  4.5 millicuries I [id number] Ioflupane COMPARISON:  None Available. FINDINGS: Symmetric uptake within LEFT and RIGHT striata. The heads of the caudate nuclei and the posterior striata (putamen) are normal shape. No evidence of significant loss of dopamine transport populations in the basal ganglia. IMPRESSION: Ioflupane scan within normal limits. No reduced radiotracer activity in basal ganglia to suggest Parkinson's syndrome pathology. k Of note, DaTSCAN is not diagnostic of Parkinsonian syndromes, which remains a clinical diagnosis. DaTscan is an adjuvant test to aid in the clinical diagnosis of Parkinsonian syndromes. Electronically Signed   By: Suzy Bouchard M.D.   On: 03/15/2023 11:29   CT Head Wo Contrast  Result Date:  03/12/2023 CLINICAL DATA:  Frequent falls EXAM: CT HEAD WITHOUT CONTRAST CT CERVICAL SPINE WITHOUT CONTRAST TECHNIQUE: Multidetector CT imaging of the head and cervical spine was performed following the standard protocol without intravenous contrast. Multiplanar CT image reconstructions of the cervical spine were also generated. RADIATION DOSE REDUCTION: This exam was performed according to the departmental dose-optimization program which includes automated exposure control, adjustment of the mA and/or kV according to patient size and/or use of iterative reconstruction technique. COMPARISON:  MR head 11/29/2022,  cervical spine MRI 03/04/2023 FINDINGS: CT HEAD FINDINGS Brain: There is no acute intracranial hemorrhage, extra-axial fluid collection, or acute infarct. There is unchanged background parenchymal volume loss with prominence of the ventricular system and extra-axial CSF spaces. Foci of hypodensity in the supratentorial white matter likely reflect sequela of mild chronic small vessel ischemic change. An empty sella is noted, nonspecific. The suprasellar region is normal. There is no mass lesion. There is no mass effect or midline shift. Vascular: There is calcification of the bilateral carotid siphons. Skull: Normal. Negative for fracture or focal lesion. Sinuses/Orbits: The paranasal sinuses are clear. The globes and orbits are unremarkable. Other: None. CT CERVICAL SPINE FINDINGS Alignment: Normal. Skull base and vertebrae: Skull base alignment is maintained. Vertebral body heights are preserved. There is no evidence of acute fracture. There is no suspicious osseous lesion. Soft tissues and spinal canal: No prevertebral fluid or swelling. No visible canal hematoma. Disc levels: There is disc space narrowing degenerative endplate change throughout the cervical spine, most advanced at C3-C4 through C5-C6 there is overall mild facet arthropathy. Multilevel spinal canal and neural foraminal stenosis is detailed on the report from the recent cervical spine MRI. Upper chest: The imaged lung apices are clear. Other: None. IMPRESSION: 1. No acute intracranial pathology. 2. No acute fracture or traumatic malalignment of the cervical spine. Electronically Signed   By: Valetta Mole M.D.   On: 03/12/2023 13:47   CT Cervical Spine Wo Contrast  Result Date: 03/12/2023 CLINICAL DATA:  Frequent falls EXAM: CT HEAD WITHOUT CONTRAST CT CERVICAL SPINE WITHOUT CONTRAST TECHNIQUE: Multidetector CT imaging of the head and cervical spine was performed following the standard protocol without intravenous contrast. Multiplanar CT image  reconstructions of the cervical spine were also generated. RADIATION DOSE REDUCTION: This exam was performed according to the departmental dose-optimization program which includes automated exposure control, adjustment of the mA and/or kV according to patient size and/or use of iterative reconstruction technique. COMPARISON:  MR head 11/29/2022, cervical spine MRI 03/04/2023 FINDINGS: CT HEAD FINDINGS Brain: There is no acute intracranial hemorrhage, extra-axial fluid collection, or acute infarct. There is unchanged background parenchymal volume loss with prominence of the ventricular system and extra-axial CSF spaces. Foci of hypodensity in the supratentorial white matter likely reflect sequela of mild chronic small vessel ischemic change. An empty sella is noted, nonspecific. The suprasellar region is normal. There is no mass lesion. There is no mass effect or midline shift. Vascular: There is calcification of the bilateral carotid siphons. Skull: Normal. Negative for fracture or focal lesion. Sinuses/Orbits: The paranasal sinuses are clear. The globes and orbits are unremarkable. Other: None. CT CERVICAL SPINE FINDINGS Alignment: Normal. Skull base and vertebrae: Skull base alignment is maintained. Vertebral body heights are preserved. There is no evidence of acute fracture. There is no suspicious osseous lesion. Soft tissues and spinal canal: No prevertebral fluid or swelling. No visible canal hematoma. Disc levels: There is disc space narrowing degenerative endplate change throughout the cervical spine, most advanced at  C3-C4 through C5-C6 there is overall mild facet arthropathy. Multilevel spinal canal and neural foraminal stenosis is detailed on the report from the recent cervical spine MRI. Upper chest: The imaged lung apices are clear. Other: None. IMPRESSION: 1. No acute intracranial pathology. 2. No acute fracture or traumatic malalignment of the cervical spine. Electronically Signed   By: Valetta Mole  M.D.   On: 03/12/2023 13:47   DG Lumbar Spine 2-3 Views  Result Date: 03/12/2023 CLINICAL DATA:  Falls. EXAM: LUMBAR SPINE - 2-3 VIEW COMPARISON:  None Available. FINDINGS: Mild L1 compression deformity is noted consistent with old fracture. No acute fracture or spondylolisthesis is noted. Mild degenerative disc disease is noted at L1-2, L2-3 and L3-4 with anterior osteophyte formation. IMPRESSION: Mild multilevel degenerative disc disease. No acute abnormality seen. Aortic Atherosclerosis (ICD10-I70.0). Electronically Signed   By: Marijo Conception M.D.   On: 03/12/2023 13:43   DG Hips Bilat W or Wo Pelvis 3-4 Views  Result Date: 03/12/2023 CLINICAL DATA:  Fall. EXAM: DG HIP (WITH OR WITHOUT PELVIS) 3-4V BILAT COMPARISON:  September 18, 2022.  September 21, 2022. FINDINGS: No fracture or dislocation is noted. Mild osteophyte formation is seen involving both hips. Sacroiliac joints are unremarkable. Stable large calcification seen projected over left ischial tuberosity consistent with old traumatic injury as described on prior MRI. 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 No acute abnormality seen. Electronically Signed   By: Marijo Conception M.D.   On: 03/12/2023 13:40   DG Shoulder Right  Result Date: 03/12/2023 CLINICAL DATA:  Right shoulder pain after fall. EXAM: RIGHT SHOULDER - 2+ VIEW COMPARISON:  February 17, 2020. FINDINGS: There is no evidence of fracture or dislocation. There is no evidence of arthropathy or other focal bone abnormality. Soft tissues are unremarkable. IMPRESSION: Negative. Electronically Signed   By: Marijo Conception M.D.   On: 03/12/2023 13:38   MR LUMBAR SPINE WO CONTRAST  Result Date: 03/07/2023 CLINICAL DATA:  Low back pain radiating into both legs for 2 months. EXAM: MRI LUMBAR SPINE WITHOUT CONTRAST TECHNIQUE: Multiplanar, multisequence MR imaging of the lumbar spine was performed. No intravenous contrast was administered. COMPARISON:  MRI lumbar spine 11/25/2018.  FINDINGS: Segmentation:  Standard. Alignment:  Maintained. Vertebrae: No acute fracture, evidence of discitis, or bone lesion. Mild superior endplate compression fractures of L1 and L2 are remote and unchanged. A few small Schmorl's nodes are noted. Conus medullaris and cauda equina: Conus extends to the L1 level. Conus and cauda equina appear normal. Paraspinal and other soft tissues: Right renal cyst again seen. Otherwise negative. Disc levels: T12-L1 is imaged in the sagittal plane only. There is a disc bulge without stenosis. L1-2: There is a shallow disc bulge and some facet degenerative disease. Mild central canal narrowing in seen. The foramina are open. L2-3: There is a lobulated central and left paracentral disc protrusion, mild-to-moderate facet arthropathy and ligamentum flavum thickening. Moderate central canal stenosis is present and there is narrowing in the lateral recesses which has progressed since the prior exam. The foramina are open. L3-4: Broad-based disc bulge, ligamentum flavum thickening and facet degenerative disease again seen. There is moderately severe central canal stenosis which appears slightly worse than on the prior exam. The foramina are open. L4-5: There is a shallow disc bulge and ligamentum flavum thickening. Mild to moderate central canal stenosis and mild bilateral foraminal narrowing. No change. L5-S1: There is ligamentum flavum thickening and a shallow disc bulge. The central canal and right  foramen are open. Mild left foraminal narrowing. No change. IMPRESSION: 1. Moderate central canal stenosis at L2-3 with narrowing in the lateral recesses appears slightly worse than on the prior exam. 2. Moderately severe central canal stenosis at L3-4 appears slightly worse than on the prior exam. 3. Mild to moderate central canal stenosis and mild bilateral foraminal narrowing at L4-5 is unchanged. 4. Mild left foraminal narrowing at L5-S1 is unchanged. Electronically Signed   By:  Inge Rise M.D.   On: 03/07/2023 09:15   MR CERVICAL SPINE WO CONTRAST  Result Date: 03/07/2023 CLINICAL DATA:  Neck pain radiating into both arms for 2 months. EXAM: MRI CERVICAL SPINE WITHOUT CONTRAST TECHNIQUE: Multiplanar, multisequence MR imaging of the cervical spine was performed. No intravenous contrast was administered. COMPARISON:  MRI cervical spine 04/25/2020. FINDINGS: Alignment: Trace anterolisthesis C7 on T1 is due to facet degenerative change. Alignment is otherwise normal. Vertebrae: No fracture, evidence of discitis, or bone lesion. Cord: Normal signal throughout. Posterior Fossa, vertebral arteries, paraspinal tissues: Negative. Disc levels: C2-3: Mild facet degenerative disease.  Otherwise negative. C3-4: Loss of disc space height. Disc bulge with a superimposed small central protrusion again seen. The ventral cord is indented by the patient's protrusion. Moderate bilateral foraminal narrowing appears worse on the left. No change. C4-5: Loss of disc space height. A right paracentral protrusion and indents the ventral cord. Foramina mildly narrowed. No change. C5-6: Loss of disc space height. There is a shallow disc osteophyte complex and bilateral uncovertebral disease. The ventral thecal sac is effaced. Moderately severe to severe foraminal narrowing is worse on the right. No change. C6-7: Shallow disc osteophyte complex to the left and uncovertebral disease. The central canal and right foramen are open. Mild left foraminal narrowing is noted. No change. C7-T1: There is some facet degenerative disease, worse on the left. Minimal disc bulge without stenosis. No change. IMPRESSION: 1. No change in the appearance of the cervical spine since the prior MRI. 2. A central protrusion indents the ventral cord at C3-4. Moderate bilateral foraminal narrowing at this level appears worse on the left. 3. Right paracentral protrusion indents the cord at C4-5. Mild bilateral foraminal narrowing at  this level. 4. Moderately severe to severe foraminal narrowing at C5-6 is worse on the right. Electronically Signed   By: Inge Rise M.D.   On: 03/07/2023 09:08    Microbiology: Results for orders placed or performed in visit on 11/29/22  Urine Culture     Status: None   Collection Time: 11/29/22  4:08 PM   Specimen: Urine   UR  Result Value Ref Range Status   Urine Culture, Routine Final report  Final   Organism ID, Bacteria Comment  Final    Comment: Mixed urogenital flora 10,000-25,000 colony forming units per mL     Labs: CBC: Recent Labs  Lab 03/12/23 1232 03/13/23 0421 03/14/23 0857  WBC 10.4 7.5 7.4  NEUTROABS  --   --  5.0  HGB 13.5 12.9 13.9  HCT 38.6 37.1 42.1  MCV 88.9 90.5 93.1  PLT 290 235 [id number]   Basic Metabolic Panel: Recent Labs  Lab 03/12/23 1232 03/13/23 0421 03/14/23 0857 03/15/23 0438  NA 137 137 138 139  K 3.0* 3.4* 3.5 4.1  CL 104 106 104 106  CO2 23 24 22 22   GLUCOSE 118* 115* 157* 99  BUN 12 8 9 9   CREATININE 0.86 0.75 0.80 0.71  CALCIUM 9.3 8.9 9.3 9.4  MG  --  2.2  --   --  PHOS  --  3.7  --   --    Liver Function Tests: Recent Labs  Lab 03/12/23 1232  AST 25  ALT 18  ALKPHOS 71  BILITOT 1.2  PROT 6.9  ALBUMIN 4.1   CBG: Recent Labs  Lab 03/12/23 1245  GLUCAP 111*    Discharge time spent: greater than 30 minutes.  Signed: Alma Friendly, MD [facility] 03/16/2023

## 2023-03-18 ENCOUNTER — Telehealth: Payer: Self-pay | Admitting: Family Medicine

## 2023-03-18 NOTE — Telephone Encounter (Signed)
Pt was called and had to leave a message with the date and time.

## 2023-03-18 NOTE — Telephone Encounter (Signed)
Please call back and see if pt can come on 4/4 at 8:55. It has been scheduled. If that does not work then we can offer DOD with Dettinger on 4/8.

## 2023-03-18 NOTE — Telephone Encounter (Signed)
Patient was in hospital 3/26-/3/29 because of a fall. Offered appointment with other providers but daughter only wanted her to see PCP. She is aware that he is booked but she requested that a message be put in to see if patient could be worked in.

## 2023-03-20 NOTE — Telephone Encounter (Signed)
Please put her on my schedule as soon as possible, April 10, Wednesday morning 9 AM

## 2023-03-20 NOTE — Telephone Encounter (Signed)
Patient on schedule.

## 2023-03-20 NOTE — Telephone Encounter (Signed)
Thi is your patient thanks

## 2023-03-21 ENCOUNTER — Encounter: Payer: Self-pay | Admitting: Family Medicine

## 2023-03-21 ENCOUNTER — Ambulatory Visit: Payer: Medicare PPO | Admitting: Family Medicine

## 2023-03-21 VITALS — BP 127/79 | HR 62 | Ht 68.0 in | Wt 186.0 lb

## 2023-03-21 DIAGNOSIS — M545 Low back pain, unspecified: Secondary | ICD-10-CM

## 2023-03-21 DIAGNOSIS — G319 Degenerative disease of nervous system, unspecified: Secondary | ICD-10-CM | POA: Diagnosis not present

## 2023-03-21 DIAGNOSIS — K5903 Drug induced constipation: Secondary | ICD-10-CM | POA: Diagnosis not present

## 2023-03-21 MED ORDER — DICLOFENAC SODIUM 50 MG PO TBEC
50.0000 mg | DELAYED_RELEASE_TABLET | Freq: Two times a day (BID) | ORAL | 2 refills | Status: DC
Start: 2023-03-21 — End: 2023-06-25

## 2023-03-21 NOTE — Progress Notes (Addendum)
BP 127/79   Pulse 62   Ht 5\' 8"  (1.727 m)   Wt 186 lb (84.4 kg)   SpO2 100%   BMI 28.28 kg/m    Subjective:   Patient ID: Shelly Bennett, female    DOB: 30-Aug-1951, 72 y.o.   MRN: [id number]  HPI: Shelly Bennett is a 72 y.o. female presenting on 03/21/2023 for Hospitalization Follow-up (Multiple falls, loss of balance, weak)   HPI Hospital follow-up Patient is coming in today for hospital follow-up.  She was admitted on 03/12/2023 and discharged on 03/15/2023.  She was admitted for frequent falls associated with worsening gait abnormality and some dizziness.  She was recommended for possible diagnosis of neurodegenerative disorder.  Patient has been getting progressively weak quickly and when having more falls and most recently she fell on her back and lower hips and has pain through her back and lower hips region.  She is here with her husband and daughter and they said her strength has significantly worsened over the past couple months.  They tried to get her back in with neurology but did not have anything through July and they want to see about second opinion.  They did a Parkinson's scan in the hospital and it did not show any major signs of Parkinson's.  Get some hydrocodone from the hospital he is using it but is causing a lot of constipation still using MiraLAX and docusate to help with that.  Relevant past medical, surgical, family and social history reviewed and updated as indicated. Interim medical history since our last visit reviewed. Allergies and medications reviewed and updated.  Review of Systems  Constitutional:  Negative for chills and fever.  Eyes:  Negative for redness and visual disturbance.  Respiratory:  Negative for chest tightness and shortness of breath.   Cardiovascular:  Negative for chest pain and leg swelling.  Gastrointestinal:  Positive for constipation.  Musculoskeletal:  Positive for arthralgias, back pain, gait problem and myalgias.  Skin:   Negative for rash.  Neurological:  Positive for weakness. Negative for dizziness, speech difficulty, light-headedness and headaches.  Psychiatric/Behavioral:  Negative for agitation and behavioral problems.   All other systems reviewed and are negative.   Per HPI unless specifically indicated above   Allergies as of 03/21/2023       Reactions   Neomycin-polymyxin-dexameth    Other reaction(s): eye redness   Codeine    ? Reaction ER visit.    Mold Extract [trichophyton]    Migraine   Penicillins Other (See Comments)   "drew my legs up as child" Pt has tolerated cephalexin in the past.   Ace Inhibitors Cough   Angiotensin Receptor Blockers Cough   Levaquin [levofloxacin] Rash   Per notes from outpatient provider   Vytorin [ezetimibe-simvastatin] Other (See Comments)   cramping   Zetia [ezetimibe] Other (See Comments)   cramping        Medication List        Accurate as of March 21, 2023 11:59 PM. If you have any questions, ask your nurse or doctor.          amLODipine 10 MG tablet Commonly known as: NORVASC Take 1 tablet (10 mg total) by mouth daily.   aspirin 81 MG tablet Take 81 mg by mouth daily.   buPROPion 150 MG 24 hr tablet Commonly known as: WELLBUTRIN XL TAKE ONE TABLET ONCE DAILY   carbidopa-levodopa 25-100 MG tablet Commonly known as: SINEMET IR Take 1 tablet by mouth 3 (  three) times daily.   diclofenac 50 MG EC tablet Commonly known as: VOLTAREN Take 1 tablet (50 mg total) by mouth 2 (two) times daily. Started by: Elige Radon Deagan Sevin, MD   donepezil 10 MG tablet Commonly known as: ARICEPT Take 1 tablet (10 mg total) by mouth at bedtime.   hydrochlorothiazide 25 MG tablet Commonly known as: HYDRODIURIL TAKE 1/2 TABLET DAILY What changed:  how much to take how to take this when to take this   HYDROcodone-acetaminophen 5-325 MG tablet Commonly known as: NORCO/VICODIN Take 1 tablet by mouth every 6 (six) hours as needed for up to 7 days  for moderate pain.   memantine 10 MG tablet Commonly known as: NAMENDA Take 1 tablet (10 mg total) by mouth 2 (two) times daily.   montelukast 10 MG tablet Commonly known as: SINGULAIR Take 1 tablet (10 mg total) by mouth at bedtime.   omega-3 acid ethyl esters 1 g capsule Commonly known as: LOVAZA TAKE TWO CAPSULES TWICE DAILY What changed: See the new instructions.   polyethylene glycol 17 g packet Commonly known as: MIRALAX / GLYCOLAX Take 17 g by mouth 2 (two) times daily.   rosuvastatin 20 MG tablet Commonly known as: CRESTOR TAKE ONE TABLET ONCE DAILY   senna-docusate 8.6-50 MG tablet Commonly known as: Senokot-S Take 1 tablet by mouth 2 (two) times daily.   Vitamin D-3 125 MCG (5000 UT) Tabs Take 1 capsule by mouth See admin instructions. Take one capsule by mouth daily Mon thru Friday               Durable Medical Equipment  (From admission, onward)           Start     Ordered   05/23/23 0000  For home use only DME 4 wheeled rolling walker with seat ([id number])       Comments: Wants walker with 12 inch wheels for outside. Diagnosis bilateral back pain without sciatica.  Question:  Patient needs a walker to treat with the following condition  Answer:  Sciatica associated with disorder of lumbar spine   05/23/23 1501   03/21/23 0000  For home use only DME Other see comment       Comments: Raised toilet seat  Question:  Length of Need  Answer:  Lifetime   03/21/23 0935             Objective:   BP 127/79   Pulse 62   Ht 5\' 8"  (1.727 m)   Wt 186 lb (84.4 kg)   SpO2 100%   BMI 28.28 kg/m   Wt Readings from Last 3 Encounters:  05/02/23 185 lb (83.9 kg)  03/27/23 186 lb 8 oz (84.6 kg)  03/21/23 186 lb (84.4 kg)    Physical Exam Vitals and nursing note reviewed.  Constitutional:      General: She is not in acute distress.    Appearance: She is well-developed. She is not diaphoretic.  Eyes:     Conjunctiva/sclera: Conjunctivae normal.   Cardiovascular:     Rate and Rhythm: Normal rate and regular rhythm.     Heart sounds: Normal heart sounds. No murmur heard. Pulmonary:     Effort: Pulmonary effort is normal. No respiratory distress.     Breath sounds: Normal breath sounds. No wheezing.  Abdominal:     General: Abdomen is flat. Bowel sounds are normal. There is no distension.     Tenderness: There is no abdominal tenderness. There is no guarding or rebound.  Musculoskeletal:        General: Tenderness (Bilateral lower back pain) present. Normal range of motion.  Skin:    General: Skin is warm and dry.     Findings: No rash.  Neurological:     Mental Status: She is alert and oriented to person, place, and time.     Coordination: Coordination normal.  Psychiatric:        Behavior: Behavior normal.       Assessment & Plan:   Problem List Items Addressed This Visit   None Visit Diagnoses     Neurodegenerative disorder (HCC)    -  Primary   Relevant Orders   For home use only DME Other see comment   Ambulatory referral to Neurology   For home use only DME 4 wheeled rolling walker with seat ([id number])   Acute bilateral low back pain without sciatica       Relevant Medications   diclofenac (VOLTAREN) 50 MG EC tablet   Other Relevant Orders   For home use only DME 4 wheeled rolling walker with seat ([id number])   Drug-induced constipation           Will send patient to a different neurologist for second opinion.  They said they cannot get into the current neurologist until after July some time and they did not have anything sooner.  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  For her back pain from the fall recommended we try Voltaren and then we try Voltaren gel and then she can also use heating pads and she has a little bit of hydrocodone left from the hospital and she can use that occasionally but try not to use it too often.  Her weakness and ability to  walk is significantly decreased over the past month or 2 and having increased falls. Follow up plan: Return if symptoms worsen or fail to improve.  Counseling provided for all of the vaccine components Orders Placed This Encounter  Procedures   For home use only DME Other see comment   For home use only DME 4 wheeled rolling walker with seat ([id number])   Ambulatory referral to Neurology    Arville Care, MD [facility] 05/23/2023, 3:01 PM

## 2023-03-27 ENCOUNTER — Ambulatory Visit: Payer: Medicare PPO | Admitting: Neurology

## 2023-03-27 ENCOUNTER — Encounter: Payer: Self-pay | Admitting: Neurology

## 2023-03-27 ENCOUNTER — Telehealth: Payer: Self-pay | Admitting: Neurology

## 2023-03-27 VITALS — BP 150/120 | HR 63 | Ht 67.5 in | Wt 186.5 lb

## 2023-03-27 DIAGNOSIS — R4189 Other symptoms and signs involving cognitive functions and awareness: Secondary | ICD-10-CM | POA: Diagnosis not present

## 2023-03-27 DIAGNOSIS — M5440 Lumbago with sciatica, unspecified side: Secondary | ICD-10-CM | POA: Diagnosis not present

## 2023-03-27 DIAGNOSIS — R32 Unspecified urinary incontinence: Secondary | ICD-10-CM

## 2023-03-27 NOTE — Telephone Encounter (Signed)
Referral sent to pain clinic @ [facility], phone # [phone].

## 2023-03-27 NOTE — Progress Notes (Addendum)
Chief Complaint  Patient presents with   Follow-up    Rm 14, daughter &husband  Cognitive impairment, recently hospitalized after 2 falls, decreased mobility, weakness, unsteady gait      ASSESSMENT AND PLAN  Shelly Bennett is a 72 y.o. female   Cognitive impairment   Gait abnormality Most consistent with central nervous system degenerative disorder,  Personally reviewed and compared to previous MRIs brain scans, mild small vessel disease, moderate atrophy, ventriculomegaly, overall stable compared to previous scan in 2020,  MRI cervical spine in May 2021 showed multilevel degenerative changes, with mild canal stenosis variable degree of foraminal narrowing  MRI of lumbar spine showed moderate stenosis L2-3, moderate to severe stenosis L3-4, variable degree of foraminal stenosis,  DaTSCAN was negative in March 2024, could not tolerate Sinemet due to GI side effect, did not provide help either,  Her gait abnormality multifactorial, deconditioning, slow reaction time due to worsening cognitive impairment, MoCA examination 18/30 today, moderate low back pain, left hip, knee pain all contributed,  Refer her to neurosurgical pain management for her worsening low back pain  EMG nerve conduction study for better evaluation of lumbar radiculopathy  Continue physical therapy    DIAGNOSTIC DATA (LABS, IMAGING, TESTING) - I reviewed patient records, labs, notes, testing and imaging myself where available. Addendum: Neurosurgeon evaluation by Shelly Bennett Shelly Bennett on April 15, 2023, progressive neurodegenerative disorder, decreased cognitive function, Long discussion with patient and family, with the stability of her ventricular size, suspicious for normal pressure hydrocephalus is not high, nevertheless, with her significant magnetic gait, functional disability, do suggest high-volume lumbar puncture for further evaluation,  MEDICAL HISTORY: Shelly Bennett is a 72 years old  female, seen in refer by her primary care doctor Shelly Bennett for evaluation of anxiety, memory loss, initial evaluation was on November 6th 2018.    PMHx  of hypertension, coronary artery disease, hyperlipidemia,   She is a retired Tourist information centre manager. She drove herself to clinic today.   She reported stress, she is the main caretaker of her mother that is 23 years old, since 2017, she was noted to repeat herself, forget peoples name, displaced pains,   Since 2018, she also suffered depression anxiety, because of the strained relationship, she is taking BuSpar, and Paxil since August 2018 which seems to help her some,   She has mild gait abnormality due to previous left knee injury.   Laboratory evaluations, B12 474, normal liver functional tests, vitamin D level 57, lipid profile LDL 82, total cholesterol 161,   UPDATE Feb 6th 2019: She continue has mild memory loss, Mini-Mental Status Examination 28/30, MRI of the brain showed moderate generalized atrophy, ventriculomegaly mild supratentorium small vessel disease   She also complains of chronic left low back pain radiating pain to left hip, I have personally reviewed MRI lumbar in 2016, multilevel degenerative changes, most severe at L3-4, with moderate spinal stenosis, bilateral lateral recess stenosis.   UPDATE August 6th 2019: She still has mild memory loss, misplace things, her mother passed away on Jun 01, 2018, her left leg pain, low back pain has much improved after physical therapy   UPDATE March 10 2020: She is accompanied by her daughter [name] at today's visit, she had worsening gait abnormality, memory loss, initially contributed to her depression of losing her mother in 2020, but her symptoms continue to progress over the past 1 year, especially her gait abnormality, she fell few times in her yard, went to emergency room  on February 10, 2020, fell about 4 feet off the edge of her porch, landed on her right side   She  denies significant low back pain, denies radiating pain to bilateral lower extremity, or lower extremity paresthesia, denies bowel bladder incontinence   I personally reviewed MRI of the brain in December 2020, generalized atrophy, mild supratentorial and small vessel disease, there was no acute abnormalities.   Laboratory evaluations in December 2020, positive COVID-19, normal and active RPR, Lyme titer, B12, ESR, arthritis panel, vitamin D, CBC, CMP elevated high-sensitivity C-reactive protein 4.8, positive scleroderma antibody, normal thyroid functional test   I personally reviewed MRI of lumbar spine in December 2019, severe spinal stenosis at L3-4, due to circumferential disc bulging, moderate facet and ligamentum flavum hypertrophy, with progressive severe spinal stenosis, mild left greater than right lateral recess stenosis, acute mild L1 and 2 compression fracture   UPDATE June 16 2020: She is accompanied by her daughter [name] at today's clinical visit, her memory is relatively stable, Mini-Mental Status Examination in March was 28/30,   She was noted to have mild worsening of gait abnormality, contributed to her low back, hip pain We also personally reviewed MRI of the cervical spine in May 2021, multilevel degenerative changes, but no evidence of significant canal or foraminal stenosis.   MRI of brain in December 2020, no acute abnormality, mild supratentorium small vessel disease, unchanged mild enlarged bilateral lateral ventricle and third ventricle, mildly out of proportion to generalized brain atrophy, but no significant change compared to previous MRI in 2018  UPDATE Jan 24 2023: She was fairly stable before acute event in December 2023, 1 day she was found on the kitchen floor, with bowel and bladder incontinence, apparently she was trying to get up in the middle of the night, patient has no recollection of the event,  She remained confused for the next 2 days, does not know where  she was, increased gait abnormality, was seen by primary care physician, Personally reviewed MRI of the brain November 29, 2022, no acute intracranial abnormality, mild small vessel disease moderate cerebral atrophy, compared to previous MRIs in 2020, overall no significant change  Daughter reported a week prior to the incident, she was still driving, attending Christmas concert,  Since the event, she has to rely on her walker, confused, difficulty following recipe, increased gait abnormality, complains of low back pain, bowel and bladder incontinence, has to wear depends  We reviewed previous imaging studies, MRI cervical spine May 2021, multilevel degenerative changes, no significant canal stenosis, variable degree of foraminal narrowing at C3-4, C4-5,  MRI of lumbar in December 2019, acute mild L1-2 compression fracture, multilevel degenerative changes most noticeable at L3-4 with severe spinal stenosis  UPDATE March 27 2023: Is accompanied by her husband and daughter at today's clinical visit, presented to hospital on March 26 after fell twice in 1 day, stepping out of the car, getting up after lying in bed for a while, orthostatic blood pressure was negative during hospital stay, negative at today's visit as well  She complains of worsening left hip, knee pain, low back pain since fall  We personally reviewed MRI of cervical spine March 07, 2023 multilevel degenerative changes, no significant canal stenosis, moderate to severe foraminal narrowing at C5-6, worse on the right  MRI of lumbar spine moderate central canal stenosis L2-3, moderately severe canal stenosis L3-4, slight worsening compared to previous exam  MRI of the brain December 2023, no acute abnormality, mild small vessel disease,  moderate cerebral atrophy, evidence of ventriculomegaly, stable compared to previous scan in 2020  DaTSCAN March 13, 2023 showed no evidence of reduced radiotracer activity at basal ganglion, she was  giving a trial of sinemet, could not tolerated due to GI side effect, did not provide help either  Laboratory evaluation showed mild low potassium 3.0 initially, has no significant abnormality.  PHYSICAL EXAM:   Vitals:   03/27/23 0850  BP: (!) 150/120  Pulse: 63  Weight: 186 lb 8 oz (84.6 kg)  Height: 5' 7.5" (1.715 m)   Body mass index is 28.78 kg/m.  Sitting down 161/92, HR 61, Standing up 151/84, HR 67, standing up one minute 156/86, HR 63  PHYSICAL EXAMNIATION:  Gen: NAD, conversant, well nourised, well groomed                     Cardiovascular: Regular rate rhythm, no peripheral edema, warm, nontender. Eyes: Conjunctivae clear without exudates or hemorrhage Neck: Supple, no carotid bruits. Pulmonary: Clear to auscultation bilaterally   NEUROLOGICAL EXAM:  MENTAL STATUS: Speech/cognition: Awake, alert, oriented to history taking and casual conversation    03/27/2023    9:00 AM  Montreal Cognitive Assessment   Visuospatial/ Executive (0/5) 1  Naming (0/3) 3  Attention: Read list of digits (0/2) 2  Attention: Read list of letters (0/1) 0  Attention: Serial 7 subtraction starting at 100 (0/3) 2  Language: Repeat phrase (0/2) 2  Language : Fluency (0/1) 1  Abstraction (0/2) 2  Delayed Recall (0/5) 0  Orientation (0/6) 5  Total 18    CRANIAL NERVES: CN II: Visual fields are full to confrontation. Pupils are round equal and briskly reactive to light. CN III, IV, VI: extraocular movement are normal. No ptosis. CN V: Facial sensation is intact to light touch CN VII: Face is symmetric with normal eye closure  CN VIII: Hearing is normal to causal conversation. CN IX, X: Phonation is normal. CN XI: Head turning and shoulder shrug are intact  MOTOR: Mild limitation of left lower extremity movement due to left hip and knee pain, felt there was no significant weakness, I did not notice significant rigidity or bradykinesia  REFLEXES: Brisk reflex, more noticeable at  left upper and lower extremity, bilateral Babinski sign  SENSORY: Intact to light touch, pinprick and vibratory sensation are intact in fingers and toes.  COORDINATION: There is no trunk or limb dysmetria noted.  GAIT/STANCE: Need push-up to get up from seated position, wide-based cautious, dragging left leg more  REVIEW OF SYSTEMS:  Full 14 system review of systems performed and notable only for as above All other review of systems were negative.   ALLERGIES: Allergies  Allergen Reactions   Neomycin-Polymyxin-Dexameth     Other reaction(s): eye redness   Codeine     ? Reaction ER visit.    Mold Extract [Trichophyton]     Migraine   Penicillins Other (See Comments)    "drew my legs up as child" Pt has tolerated cephalexin in the past.   Ace Inhibitors Cough   Angiotensin Receptor Blockers Cough   Levaquin [Levofloxacin] Rash    Per notes from outpatient provider   Vytorin [Ezetimibe-Simvastatin] Other (See Comments)    cramping   Zetia [Ezetimibe] Other (See Comments)    cramping    HOME MEDICATIONS: Current Outpatient Medications  Medication Sig Dispense Refill   amLODipine (NORVASC) 10 MG tablet Take 1 tablet (10 mg total) by mouth daily. 90 tablet 3   aspirin  81 MG tablet Take 81 mg by mouth daily.      buPROPion (WELLBUTRIN XL) 150 MG 24 hr tablet TAKE ONE TABLET ONCE DAILY 90 tablet 1   Cholecalciferol (VITAMIN D-3) 5000 UNITS TABS Take 1 capsule by mouth See admin instructions. Take one capsule by mouth daily Mon thru Friday     diclofenac (VOLTAREN) 50 MG EC tablet Take 1 tablet (50 mg total) by mouth 2 (two) times daily. 60 tablet 2   donepezil (ARICEPT) 10 MG tablet Take 1 tablet (10 mg total) by mouth at bedtime. 90 tablet 3   hydrochlorothiazide (HYDRODIURIL) 25 MG tablet TAKE 1/2 TABLET DAILY (Patient taking differently: Take 12.5 mg by mouth daily. TAKE 1/2 TABLET DAILY) 45 tablet 1   memantine (NAMENDA) 10 MG tablet Take 1 tablet (10 mg total) by mouth 2  (two) times daily. 180 tablet 3   montelukast (SINGULAIR) 10 MG tablet Take 1 tablet (10 mg total) by mouth at bedtime. 90 tablet 0   omega-3 acid ethyl esters (LOVAZA) 1 g capsule TAKE TWO CAPSULES TWICE DAILY (Patient taking differently: Take 2 g by mouth 2 (two) times daily.) 360 capsule 3   polyethylene glycol (MIRALAX / GLYCOLAX) 17 g packet Take 17 g by mouth 2 (two) times daily. 14 each 0   rosuvastatin (CRESTOR) 20 MG tablet TAKE ONE TABLET ONCE DAILY 90 tablet 1   senna-docusate (SENOKOT-S) 8.6-50 MG tablet Take 1 tablet by mouth 2 (two) times daily.     No current facility-administered medications for this visit.    PAST MEDICAL HISTORY: Past Medical History:  Diagnosis Date   Adenomatous colon polyp    Allergic drug reaction 06/25/2015   Allergy    Anxiety    Arthritis    At risk for falls    fallen 4 times recently    CAD (coronary artery disease)    Dr. Excell Seltzer follows    Carpal tunnel syndrome    Chronic headaches    Coccydynia 02/01/2012   Cystocele    Diverticulosis    External hemorrhoids    Focal nodular hyperplasia of liver    Gait abnormality    GERD (gastroesophageal reflux disease)    some   HCAP (healthcare-associated pneumonia) 06/25/2015   Heart murmur    HLD (hyperlipidemia)    HTN (hypertension)    IBS (irritable bowel syndrome)    Memory loss    Neuromuscular disorder    some neuropathy issues in the past- legs fibromyalgia in past-- past hx of steroid injections   Obesity    Osteoporosis    Otitis of left ear 02/01/2017   Pneumonia    Skin cancer    squamous cell - face    PAST SURGICAL HISTORY: Past Surgical History:  Procedure Laterality Date   CARPAL TUNNEL RELEASE     COLONOSCOPY  12/15/2008   diverticulosis, external hemorrhoids   KNEE SURGERY  05/22/2015   right   LAPAROSCOPY     SKIN SURGERY     squamous cell - right face     FAMILY HISTORY: Family History  Problem Relation Age of Onset   Hyperlipidemia Mother     Hypertension Mother    Kidney disease Mother    Osteoporosis Mother    Heart murmur Mother    Dementia Mother    Prostate cancer Father    Colon cancer Father 9   Kidney disease Father    Heart disease Father    Alzheimer's disease Father    Hyperlipidemia  Sister    Hypertension Sister    Rectal cancer Maternal Grandmother 78   Heart disease Maternal Grandfather    Heart disease Brother 67       not dx questionable    Colon polyps Neg Hx    Esophageal cancer Neg Hx    Stomach cancer Neg Hx    Breast cancer Neg Hx     SOCIAL HISTORY: Social History   Socioeconomic History   Marital status: Married    Spouse name: Maisie Fus   Number of children: 4   Years of education: 16   Highest education level: Bachelor's degree (e.g., BA, AB, BS)  Occupational History   Occupation: Runner, broadcasting/film/video retired  Tobacco Use   Smoking status: Never   Smokeless tobacco: Never  Vaping Use   Vaping Use: Never used  Substance and Sexual Activity   Alcohol use: No   Drug use: No   Sexual activity: Not Currently    Birth control/protection: None  Other Topics Concern   Not on file  Social History Narrative   Lives at home with her husband.   Right-handed.   1 cup coffee each morning. 2-3 small soda cans each day.   Social Determinants of Health   Financial Resource Strain: Low Risk  (07/24/2022)   Overall Financial Resource Strain (CARDIA)    Difficulty of Paying Living Expenses: Not hard at all  Food Insecurity: No Food Insecurity (03/12/2023)   Hunger Vital Sign    Worried About Running Out of Food in the Last Year: Never true    Ran Out of Food in the Last Year: Never true  Transportation Needs: No Transportation Needs (03/12/2023)   PRAPARE - Administrator, Civil Service (Medical): No    Lack of Transportation (Non-Medical): No  Physical Activity: Insufficiently Active (07/24/2022)   Exercise Vital Sign    Days of Exercise per Week: 6 days    Minutes of Exercise per Session: 20  min  Stress: No Stress Concern Present (07/24/2022)   [employer] of Occupational Health - Occupational Stress Questionnaire    Feeling of Stress : Only a little  Social Connections: Socially Integrated (07/24/2022)   Social Connection and Isolation Panel [NHANES]    Frequency of Communication with Friends and Family: More than three times a week    Frequency of Social Gatherings with Friends and Family: More than three times a week    Attends Religious Services: More than 4 times per year    Active Member of [employer] or Organizations: Yes    Attends Banker Meetings: More than 4 times per year    Marital Status: Married  Catering manager Violence: Not At Risk (03/12/2023)   Humiliation, Afraid, Rape, and Kick questionnaire    Fear of Current or Ex-Partner: No    Emotionally Abused: No    Physically Abused: No    Sexually Abused: No      Levert Feinstein, M.D. Ph.D.  [facility] [address], [address] [place], [place] [address] Ph: [phone] Fax: [phone]  CC:  Dettinger, Elige Radon, MD [address] [place],  [place] [address]  Dettinger, Elige Radon, MD

## 2023-04-01 ENCOUNTER — Other Ambulatory Visit: Payer: Self-pay | Admitting: Family Medicine

## 2023-04-12 DIAGNOSIS — L570 Actinic keratosis: Secondary | ICD-10-CM | POA: Diagnosis not present

## 2023-04-12 DIAGNOSIS — L820 Inflamed seborrheic keratosis: Secondary | ICD-10-CM | POA: Diagnosis not present

## 2023-04-12 DIAGNOSIS — L728 Other follicular cysts of the skin and subcutaneous tissue: Secondary | ICD-10-CM | POA: Diagnosis not present

## 2023-04-12 DIAGNOSIS — L821 Other seborrheic keratosis: Secondary | ICD-10-CM | POA: Diagnosis not present

## 2023-04-12 DIAGNOSIS — L814 Other melanin hyperpigmentation: Secondary | ICD-10-CM | POA: Diagnosis not present

## 2023-04-12 DIAGNOSIS — Z86007 Personal history of in-situ neoplasm of skin: Secondary | ICD-10-CM | POA: Diagnosis not present

## 2023-04-12 DIAGNOSIS — Z08 Encounter for follow-up examination after completed treatment for malignant neoplasm: Secondary | ICD-10-CM | POA: Diagnosis not present

## 2023-04-13 ENCOUNTER — Other Ambulatory Visit: Payer: Self-pay | Admitting: Family Medicine

## 2023-04-13 DIAGNOSIS — J301 Allergic rhinitis due to pollen: Secondary | ICD-10-CM

## 2023-04-15 DIAGNOSIS — Z6828 Body mass index (BMI) 28.0-28.9, adult: Secondary | ICD-10-CM | POA: Diagnosis not present

## 2023-04-15 DIAGNOSIS — G9389 Other specified disorders of brain: Secondary | ICD-10-CM | POA: Diagnosis not present

## 2023-04-19 ENCOUNTER — Encounter: Payer: Self-pay | Admitting: Neurology

## 2023-04-19 DIAGNOSIS — R32 Unspecified urinary incontinence: Secondary | ICD-10-CM

## 2023-04-19 DIAGNOSIS — G20C Parkinsonism, unspecified: Secondary | ICD-10-CM

## 2023-04-19 DIAGNOSIS — R4189 Other symptoms and signs involving cognitive functions and awareness: Secondary | ICD-10-CM

## 2023-04-19 DIAGNOSIS — M5440 Lumbago with sciatica, unspecified side: Secondary | ICD-10-CM

## 2023-04-23 ENCOUNTER — Encounter: Payer: Self-pay | Admitting: Neurology

## 2023-04-23 ENCOUNTER — Telehealth: Payer: Self-pay | Admitting: Neurology

## 2023-04-23 ENCOUNTER — Telehealth: Payer: Self-pay

## 2023-04-23 DIAGNOSIS — G20C Parkinsonism, unspecified: Secondary | ICD-10-CM

## 2023-04-23 DIAGNOSIS — R32 Unspecified urinary incontinence: Secondary | ICD-10-CM

## 2023-04-23 DIAGNOSIS — M5440 Lumbago with sciatica, unspecified side: Secondary | ICD-10-CM

## 2023-04-23 DIAGNOSIS — R4189 Other symptoms and signs involving cognitive functions and awareness: Secondary | ICD-10-CM

## 2023-04-23 NOTE — Telephone Encounter (Signed)
Orders Placed This Encounter  Procedures  . DG FL GUIDED LUMBAR PUNCTURE   

## 2023-04-23 NOTE — Telephone Encounter (Signed)
Unable to reach pt over the phone, sent mychart msg informing pt of need to reschedule 07/10/23 appt - MD out

## 2023-04-23 NOTE — Telephone Encounter (Signed)
I have ordered large volume LP at IR, please put her on my schedule before and after IR to evaluate.  Orders Placed This Encounter  Procedures   DG FL GUIDED LUMBAR PUNCTURE

## 2023-04-23 NOTE — Telephone Encounter (Signed)
Orders Placed This Encounter  Procedures   [facility] Phospho-Tau/Total-Tau/A Beta42,Analysis and Interp,CSF(Symptomatic)

## 2023-04-23 NOTE — Telephone Encounter (Signed)
Call to daughter [name], advised per Dr. Terrace Arabia that she would like to see her in the clinic pre and post op LP at IR. Informed daughter that May 16th would work best for Dr. Terrace Arabia if she can possible get that date. Daughter would like to know exactly what LP fluids will be tested for. She is okay with information being sent via MyChart.

## 2023-04-23 NOTE — Addendum Note (Signed)
Addended by: Levert Feinstein on: 04/23/2023 05:46 PM   Modules accepted: Orders

## 2023-05-02 ENCOUNTER — Other Ambulatory Visit: Payer: Medicare PPO

## 2023-05-02 ENCOUNTER — Encounter: Payer: Self-pay | Admitting: Neurology

## 2023-05-02 ENCOUNTER — Ambulatory Visit (INDEPENDENT_AMBULATORY_CARE_PROVIDER_SITE_OTHER): Payer: Self-pay | Admitting: Neurology

## 2023-05-02 ENCOUNTER — Ambulatory Visit: Payer: Medicare PPO | Admitting: Neurology

## 2023-05-02 ENCOUNTER — Telehealth: Payer: Self-pay

## 2023-05-02 ENCOUNTER — Ambulatory Visit
Admission: RE | Admit: 2023-05-02 | Discharge: 2023-05-02 | Disposition: A | Discharge status: Home / Self Care | Payer: Medicare PPO | Source: Ambulatory Visit | Attending: Neurology | Admitting: Neurology

## 2023-05-02 VITALS — BP 122/76 | HR 62

## 2023-05-02 VITALS — BP 151/75 | HR 63

## 2023-05-02 VITALS — BP 134/78 | Ht 68.0 in | Wt 185.0 lb

## 2023-05-02 DIAGNOSIS — R269 Unspecified abnormalities of gait and mobility: Secondary | ICD-10-CM | POA: Diagnosis not present

## 2023-05-02 DIAGNOSIS — F0390 Unspecified dementia without behavioral disturbance: Secondary | ICD-10-CM

## 2023-05-02 DIAGNOSIS — R4189 Other symptoms and signs involving cognitive functions and awareness: Secondary | ICD-10-CM

## 2023-05-02 DIAGNOSIS — R32 Unspecified urinary incontinence: Secondary | ICD-10-CM

## 2023-05-02 DIAGNOSIS — M5440 Lumbago with sciatica, unspecified side: Secondary | ICD-10-CM

## 2023-05-02 DIAGNOSIS — G20C Parkinsonism, unspecified: Secondary | ICD-10-CM

## 2023-05-02 DIAGNOSIS — R296 Repeated falls: Secondary | ICD-10-CM | POA: Diagnosis not present

## 2023-05-02 DIAGNOSIS — M545 Low back pain, unspecified: Secondary | ICD-10-CM | POA: Diagnosis not present

## 2023-05-02 DIAGNOSIS — G3184 Mild cognitive impairment, so stated: Secondary | ICD-10-CM | POA: Diagnosis not present

## 2023-05-02 NOTE — Progress Notes (Signed)
See previous note dated May 16th 2024

## 2023-05-02 NOTE — Discharge Instructions (Signed)

## 2023-05-02 NOTE — Progress Notes (Addendum)
Chief Complaint  Patient presents with   Other    Rm 15, Visit prior to LP      ASSESSMENT AND PLAN  Acire Tang is a 72 y.o. female   Cognitive impairment   Gait abnormality Most consistent with central nervous system degenerative disorder,  Personally reviewed and compared to previous MRIs brain scans, mild small vessel disease, moderate atrophy, ventriculomegaly, overall stable compared to previous scan in 2020,  MRI cervical spine in May 2021 showed multilevel degenerative changes, with mild canal stenosis variable degree of foraminal narrowing  MRI of lumbar spine showed moderate stenosis L2-3, moderate to severe stenosis L3-4, variable degree of foraminal stenosis,  DaTSCAN was negative in March 2024, could not tolerate Sinemet due to GI side effect, did not provide help either,  Her gait abnormality multifactorial, deconditioning, slow reaction time due to worsening cognitive impairment, moderate low back pain, left hip, knee pain all contributed,  Family want to try large-volume lumbar puncture to rule out possibility of normal pressure hydrocephalus, I did not see significant change before and post LP on today's examination,    DIAGNOSTIC DATA (LABS, IMAGING, TESTING) - I reviewed patient records, labs, notes, testing and imaging myself where available.  Neurosurgery evaluation by Dr. Cindee Salt on May 10, 2023, patient with large-volume lumbar puncture, not a VP shunt candidate, moderate to severe lumbar stenosis, consider left L2-3 epidural injection for low back pain,  MEDICAL HISTORY: Hydia Copelin is a 72 years old female, seen in refer by her primary care doctor [name] for evaluation of anxiety, memory loss, initial evaluation was on November 6th 2018.    PMHx  of hypertension, coronary artery disease, hyperlipidemia,   She is a retired Tourist information centre manager. She drove herself to clinic today.   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   Laboratory evaluations, B12 474, normal liver functional tests, vitamin D level 57, lipid profile LDL 82, total cholesterol 161,   UPDATE Feb 6th 2019: She continue has mild memory loss, Mini-Mental Status Examination 28/30, MRI of the brain showed moderate generalized atrophy, ventriculomegaly mild supratentorium small vessel disease   She also complains of chronic left low back pain radiating pain to left hip, I have personally reviewed MRI lumbar in 2016, multilevel degenerative changes, most severe at L3-4, with moderate spinal stenosis, bilateral lateral recess stenosis.   UPDATE August 6th 2019: She still has mild memory loss, misplace things, her mother passed away on 06-02-18, her left leg pain, low back pain has much improved after physical therapy   UPDATE March 10 2020: She is accompanied by her daughter [name] at today's visit, she had worsening gait abnormality, memory loss, initially contributed to her depression of losing her mother in 2020, but her symptoms continue to progress over the past 1 year, especially her gait abnormality, she fell few times in her yard, went to emergency room on February 10, 2020, fell about 4 feet off the edge of her porch, landed on her right side   She denies significant low back pain, denies radiating pain to bilateral lower extremity,  or lower extremity paresthesia, denies bowel bladder incontinence   I personally reviewed MRI of the brain in December 2020, generalized atrophy, mild supratentorial and small vessel disease, there was no acute abnormalities.   Laboratory  evaluations in December 2020, positive COVID-19, normal and active RPR, Lyme titer, B12, ESR, arthritis panel, vitamin D, CBC, CMP elevated high-sensitivity C-reactive protein 4.8, positive scleroderma antibody, normal thyroid functional test   I personally reviewed MRI of lumbar spine in December 2019, severe spinal stenosis at L3-4, due to circumferential disc bulging, moderate facet and ligamentum flavum hypertrophy, with progressive severe spinal stenosis, mild left greater than right lateral recess stenosis, acute mild L1 and 2 compression fracture   UPDATE June 16 2020: She is accompanied by her daughter [name] at today's clinical visit, her memory is relatively stable, Mini-Mental Status Examination in March was 28/30,   She was noted to have mild worsening of gait abnormality, contributed to her low back, hip pain We also personally reviewed MRI of the cervical spine in May 2021, multilevel degenerative changes, but no evidence of significant canal or foraminal stenosis.   MRI of brain in December 2020, no acute abnormality, mild supratentorium small vessel disease, unchanged mild enlarged bilateral lateral ventricle and third ventricle, mildly out of proportion to generalized brain atrophy, but no significant change compared to previous MRI in 2018  UPDATE Jan 24 2023: She was fairly stable before acute event in December 2023, 1 day she was found on the kitchen floor, with bowel and bladder incontinence, apparently she was trying to get up in the middle of the night, patient has no recollection of the event,  She remained confused for the next 2 days, does not know where she was, increased gait abnormality, was seen by primary care physician, Personally reviewed MRI of the brain November 29, 2022, no acute intracranial abnormality, mild small vessel disease moderate cerebral atrophy, compared to previous MRIs in 2020, overall no significant change  Daughter reported a week prior to the  incident, she was still driving, attending Christmas concert,  Since the event, she has to rely on her walker, confused, difficulty following recipe, increased gait abnormality, complains of low back pain, bowel and bladder incontinence, has to wear depends  We reviewed previous imaging studies, MRI cervical spine May 2021, multilevel degenerative changes, no significant canal stenosis, variable degree of foraminal narrowing at C3-4, C4-5,  MRI of lumbar in December 2019, acute mild L1-2 compression fracture, multilevel degenerative changes most noticeable at L3-4 with severe spinal stenosis  UPDATE March 27 2023: Is accompanied by her husband and daughter at today's clinical visit, presented to hospital on March 26 after fell twice in 1 day, stepping out of the car, getting up after lying in bed for a while, orthostatic blood pressure was negative during hospital stay, negative at today's visit as well  She complains of worsening left hip, knee pain, low back pain since fall  We personally reviewed MRI of cervical spine March 07, 2023 multilevel degenerative changes, no significant canal stenosis, moderate to severe foraminal narrowing at C5-6, worse on the right  MRI of lumbar spine moderate central canal stenosis L2-3, moderately severe canal stenosis L3-4, slight worsening compared to previous exam  MRI of the brain December 2023, no acute abnormality, mild small vessel disease, moderate cerebral atrophy, evidence of ventriculomegaly, stable compared to previous scan in 2020  DaTSCAN March 13, 2023 showed no evidence of reduced radiotracer activity at basal ganglion, she was giving a trial of  sinemet, could not tolerated due to GI side effect, did not provide help either  Laboratory evaluation showed mild low potassium 3.0 initially, has no significant abnormality.  UPDATE May 02 2023:  Neurosurgeon evaluation by Dr. Marliss Czar on April 15, 2023, progressive neurodegenerative  disorder, decreased cognitive function, Long discussion with patient and family, with the stability of her ventricular size, suspicious for normal pressure hydrocephalus is not high, nevertheless, with her significant magnetic gait, functional disability, do suggest high-volume lumbar puncture for further evaluation,  Family decided to proceed with large-volume lumbar puncture, on schedule today, accompanied by husband and daughter at today's clinical visit, pre and post lumbar puncture evaluation, complains of 5 out of 10 right knee and left hip pain, gait abnormality,  MoCA 19/30 pre-LP, will repeat post LP, spinal fluid testing including phosphorylated tau, a beta 42 analysis,  I evaluated patient before and right after lumbar puncture, there was no significant change noted in her gait abnormality, cognitive function,  PHYSICAL EXAM:   Vitals:   05/02/23 0849  BP: 134/78  Weight: 185 lb (83.9 kg)  Height: 5\' 8"  (1.727 m)   Body mass index is 28.13 kg/m.    PHYSICAL EXAMNIATION:  Gen: NAD, conversant, well nourised, well groomed                     Cardiovascular: Regular rate rhythm, no peripheral edema, warm, nontender. Eyes: Conjunctivae clear without exudates or hemorrhage Neck: Supple, no carotid bruits. Pulmonary: Clear to auscultation bilaterally   NEUROLOGICAL EXAM:  MENTAL STATUS: Speech/cognition: Awake, alert, oriented to history taking and casual conversation     05/02/2023   11:00 AM 05/02/2023    9:00 AM 03/27/2023    9:00 AM  Montreal Cognitive Assessment   Visuospatial/ Executive (0/5) 3 3 1   Naming (0/3) 3 3 3   Attention: Read list of digits (0/2) 2 2 2   Attention: Read list of letters (0/1) 1 1 0  Attention: Serial 7 subtraction starting at 100 (0/3) 1 3 2   Language: Repeat phrase (0/2) 2 2 2   Language : Fluency (0/1) 1 1 1   Abstraction (0/2) 2 2 2   Delayed Recall (0/5) 1 1 0  Orientation (0/6) 4 1 5   Total 20 19 18     CRANIAL NERVES: CN II: Visual  fields are full to confrontation. Pupils are round equal and briskly reactive to light. CN III, IV, VI: extraocular movement are normal. No ptosis. CN V: Facial sensation is intact to light touch CN VII: Face is symmetric with normal eye closure  CN VIII: Hearing is normal to causal conversation. CN IX, X: Phonation is normal. CN XI: Head turning and shoulder shrug are intact  MOTOR: Left shoulder pain limited proximal upper extremity strength no significant upper extremity weakness  Mild limitation of left lower extremity movement due to left hip and knee pain, felt there was no significant weakness, I did not notice significant rigidity or bradykinesia  REFLEXES: Brisk reflex, more noticeable at left upper and lower extremity, bilateral Babinski sign  SENSORY: Intact to light touch,   COORDINATION: There is no trunk or limb dysmetria noted.  GAIT/STANCE: Need push-up to get up from seated position, wide-based , antalgic, cautious, difficulty initiate gait  REVIEW OF SYSTEMS:  Full 14 system review of systems performed and notable only for as above All other review of systems were negative.   ALLERGIES: Allergies  Allergen Reactions   Neomycin-Polymyxin-Dexameth     Other reaction(s): eye redness  Codeine     ? Reaction ER visit.    Mold Extract [Trichophyton]     Migraine   Penicillins Other (See Comments)    "drew my legs up as child" Pt has tolerated cephalexin in the past.   Ace Inhibitors Cough   Angiotensin Receptor Blockers Cough   Levaquin [Levofloxacin] Rash    Per notes from outpatient provider   Vytorin [Ezetimibe-Simvastatin] Other (See Comments)    cramping   Zetia [Ezetimibe] Other (See Comments)    cramping    HOME MEDICATIONS: Current Outpatient Medications  Medication Sig Dispense Refill   amLODipine (NORVASC) 10 MG tablet Take 1 tablet (10 mg total) by mouth daily. 90 tablet 3   aspirin 81 MG tablet Take 81 mg by mouth daily.      buPROPion  (WELLBUTRIN XL) 150 MG 24 hr tablet TAKE ONE TABLET ONCE DAILY 90 tablet 1   Cholecalciferol (VITAMIN D-3) 5000 UNITS TABS Take 1 capsule by mouth See admin instructions. Take one capsule by mouth daily Mon thru Friday     diclofenac (VOLTAREN) 50 MG EC tablet Take 1 tablet (50 mg total) by mouth 2 (two) times daily. 60 tablet 2   donepezil (ARICEPT) 10 MG tablet Take 1 tablet (10 mg total) by mouth at bedtime. 90 tablet 3   hydrochlorothiazide (HYDRODIURIL) 25 MG tablet TAKE 1/2 TABLET DAILY (Patient taking differently: Take 12.5 mg by mouth daily. TAKE 1/2 TABLET DAILY) 45 tablet 1   memantine (NAMENDA) 10 MG tablet TAKE ONE TABLET TWICE DAILY 180 tablet 0   montelukast (SINGULAIR) 10 MG tablet TAKE ONE TABLET AT BEDTIME 90 tablet 1   omega-3 acid ethyl esters (LOVAZA) 1 g capsule TAKE TWO CAPSULES TWICE DAILY (Patient taking differently: Take 2 g by mouth 2 (two) times daily.) 360 capsule 3   polyethylene glycol (MIRALAX / GLYCOLAX) 17 g packet Take 17 g by mouth 2 (two) times daily. 14 each 0   rosuvastatin (CRESTOR) 20 MG tablet TAKE ONE TABLET ONCE DAILY 90 tablet 1   senna-docusate (SENOKOT-S) 8.6-50 MG tablet Take 1 tablet by mouth 2 (two) times daily.     No current facility-administered medications for this visit.    PAST MEDICAL HISTORY: Past Medical History:  Diagnosis Date   Adenomatous colon polyp    Allergic drug reaction 06/25/2015   Allergy    Anxiety    Arthritis    At risk for falls    fallen 4 times recently    CAD (coronary artery disease)    Dr. Excell Seltzer follows    Carpal tunnel syndrome    Chronic headaches    Coccydynia 02/01/2012   Cystocele    Diverticulosis    External hemorrhoids    Focal nodular hyperplasia of liver    Gait abnormality    GERD (gastroesophageal reflux disease)    some   HCAP (healthcare-associated pneumonia) 06/25/2015   Heart murmur    HLD (hyperlipidemia)    HTN (hypertension)    IBS (irritable bowel syndrome)    Memory loss     Neuromuscular disorder (HCC)    some neuropathy issues in the past- legs fibromyalgia in past-- past hx of steroid injections   Obesity    Osteoporosis    Otitis of left ear 02/01/2017   Pneumonia    Skin cancer    squamous cell - face    PAST SURGICAL HISTORY: Past Surgical History:  Procedure Laterality Date   CARPAL TUNNEL RELEASE     COLONOSCOPY  12/15/2008   diverticulosis, external hemorrhoids   KNEE SURGERY  05/22/2015   right   LAPAROSCOPY     SKIN SURGERY     squamous cell - right face     FAMILY HISTORY: Family History  Problem Relation Age of Onset   Hyperlipidemia Mother    Hypertension Mother    Kidney disease Mother    Osteoporosis Mother    Heart murmur Mother    Dementia Mother    Prostate cancer Father    Colon cancer Father 72   Kidney disease Father    Heart disease Father    Alzheimer's disease Father    Hyperlipidemia Sister    Hypertension Sister    Rectal cancer Maternal Grandmother 32   Heart disease Maternal Grandfather    Heart disease Brother 75       not dx questionable    Colon polyps Neg Hx    Esophageal cancer Neg Hx    Stomach cancer Neg Hx    Breast cancer Neg Hx     SOCIAL HISTORY: Social History   Socioeconomic History   Marital status: Married    Spouse name: Maisie Fus   Number of children: 4   Years of education: 16   Highest education level: Bachelor's degree (e.g., BA, AB, BS)  Occupational History   Occupation: Runner, broadcasting/film/video retired  Tobacco Use   Smoking status: Never   Smokeless tobacco: Never  Vaping Use   Vaping Use: Never used  Substance and Sexual Activity   Alcohol use: No   Drug use: No   Sexual activity: Not Currently    Birth control/protection: None  Other Topics Concern   Not on file  Social History Narrative   Lives at home with her husband.   Right-handed.   1 cup coffee each morning. 2-3 small soda cans each day.   Social Determinants of Health   Financial Resource Strain: Low Risk  (07/24/2022)    Overall Financial Resource Strain (CARDIA)    Difficulty of Paying Living Expenses: Not hard at all  Food Insecurity: No Food Insecurity (03/12/2023)   Hunger Vital Sign    Worried About Running Out of Food in the Last Year: Never true    Ran Out of Food in the Last Year: Never true  Transportation Needs: No Transportation Needs (03/12/2023)   PRAPARE - Administrator, Civil Service (Medical): No    Lack of Transportation (Non-Medical): No  Physical Activity: Insufficiently Active (07/24/2022)   Exercise Vital Sign    Days of Exercise per Week: 6 days    Minutes of Exercise per Session: 20 min  Stress: No Stress Concern Present (07/24/2022)   [employer] of Occupational Health - Occupational Stress Questionnaire    Feeling of Stress : Only a little  Social Connections: Socially Integrated (07/24/2022)   Social Connection and Isolation Panel [NHANES]    Frequency of Communication with Friends and Family: More than three times a week    Frequency of Social Gatherings with Friends and Family: More than three times a week    Attends Religious Services: More than 4 times per year    Active Member of [employer] or Organizations: Yes    Attends Banker Meetings: More than 4 times per year    Marital Status: Married  Catering manager Violence: Not At Risk (03/12/2023)   Humiliation, Afraid, Rape, and Kick questionnaire    Fear of Current or Ex-Partner: No    Emotionally Abused: No  Physically Abused: No    Sexually Abused: No      Levert Feinstein, M.D. Ph.D.  [facility] [address] [place], [place] [address] Ph: [phone] Fax: [phone]  CC:  Dettinger, Elige Radon, MD [address] [place],  [place] [address]  Dettinger, Elige Radon, MD    Total time spent reviewing the chart, obtaining history, examined patient, ordering tests, documentation, consultations and family, care coordination was 60 minutes

## 2023-05-02 NOTE — Telephone Encounter (Signed)
Daughter call requesting is they could come sooner due to finishing the LP sooner. Dr, Terrace Arabia agreed. Daughter updated and appreciative.

## 2023-05-03 LAB — GLUCOSE, CSF: Glucose, CSF: 58 mg/dL (ref 40–80)

## 2023-05-03 LAB — GRAM STAIN: SPECIMEN QUALITY:: ADEQUATE

## 2023-05-04 LAB — GRAM STAIN: MICRO NUMBER:: 14965410

## 2023-05-06 LAB — CSF CELL COUNT WITH DIFFERENTIAL
RBC Count, CSF: 1 cells/uL — ABNORMAL HIGH
TOTAL NUCLEATED CELL: 1 cells/uL (ref 0–5)

## 2023-05-06 LAB — FUNGUS CULTURE W SMEAR: MICRO NUMBER:: 14965411

## 2023-05-06 NOTE — Telephone Encounter (Signed)
Erroneous encounter will close.

## 2023-05-08 LAB — FUNGUS CULTURE W SMEAR

## 2023-05-08 LAB — GRAM STAIN

## 2023-05-08 LAB — ADMARK® PHOSPHO-TAU/TOTAL-TAU/A BETA42,CSF

## 2023-05-08 LAB — CSF CELL COUNT WITH DIFFERENTIAL

## 2023-05-08 LAB — PROTEIN, CSF: Total Protein, CSF: 39 mg/dL (ref 15–60)

## 2023-05-08 LAB — VDRL, CSF: VDRL Quant, CSF: NONREACTIVE

## 2023-05-09 NOTE — Telephone Encounter (Signed)
"  The specimen submitted did not  meet the specimen requirements.  Please refer to the [employer] On-Line Test Directory  or call Client Services for proper  requirements."  She might have to repeat the test again.

## 2023-05-09 NOTE — Telephone Encounter (Signed)
I don't think any additional changes can be made. She might have to repeat lumbar puncture.

## 2023-05-10 DIAGNOSIS — M5416 Radiculopathy, lumbar region: Secondary | ICD-10-CM | POA: Diagnosis not present

## 2023-05-22 DIAGNOSIS — M5416 Radiculopathy, lumbar region: Secondary | ICD-10-CM | POA: Diagnosis not present

## 2023-05-23 ENCOUNTER — Telehealth: Payer: Self-pay | Admitting: Family Medicine

## 2023-05-23 NOTE — Addendum Note (Signed)
Addended by: Arville Care on: 05/23/2023 03:02 PM   Modules accepted: Orders

## 2023-05-24 NOTE — Telephone Encounter (Signed)
LMOVM to find out where they want order for walker to be sent to

## 2023-05-24 NOTE — Telephone Encounter (Signed)
Pt came by and picked up order for walker.

## 2023-06-03 LAB — FUNGUS CULTURE W SMEAR
SMEAR:: NONE SEEN
SPECIMEN QUALITY:: ADEQUATE

## 2023-06-25 ENCOUNTER — Other Ambulatory Visit: Payer: Self-pay | Admitting: Family Medicine

## 2023-06-25 DIAGNOSIS — M545 Low back pain, unspecified: Secondary | ICD-10-CM

## 2023-06-27 ENCOUNTER — Ambulatory Visit: Payer: Medicare PPO | Admitting: Family Medicine

## 2023-06-27 ENCOUNTER — Encounter: Payer: Self-pay | Admitting: Family Medicine

## 2023-06-27 VITALS — Ht 68.0 in

## 2023-06-27 DIAGNOSIS — I10 Essential (primary) hypertension: Secondary | ICD-10-CM | POA: Diagnosis not present

## 2023-06-27 DIAGNOSIS — M25562 Pain in left knee: Secondary | ICD-10-CM | POA: Diagnosis not present

## 2023-06-27 DIAGNOSIS — G319 Degenerative disease of nervous system, unspecified: Secondary | ICD-10-CM

## 2023-06-27 DIAGNOSIS — M545 Low back pain, unspecified: Secondary | ICD-10-CM | POA: Diagnosis not present

## 2023-06-27 DIAGNOSIS — I2510 Atherosclerotic heart disease of native coronary artery without angina pectoris: Secondary | ICD-10-CM | POA: Diagnosis not present

## 2023-06-27 DIAGNOSIS — J301 Allergic rhinitis due to pollen: Secondary | ICD-10-CM

## 2023-06-27 DIAGNOSIS — I700 Atherosclerosis of aorta: Secondary | ICD-10-CM

## 2023-06-27 DIAGNOSIS — E782 Mixed hyperlipidemia: Secondary | ICD-10-CM | POA: Diagnosis not present

## 2023-06-27 MED ORDER — MEMANTINE HCL 10 MG PO TABS: 10.0000 mg | ORAL_TABLET | Freq: Two times a day (BID) | ORAL | 3 refills | Status: DC

## 2023-06-27 MED ORDER — BUPROPION HCL ER (XL) 150 MG PO TB24: 150.0000 mg | ORAL_TABLET | Freq: Every day | ORAL | 3 refills | Status: DC

## 2023-06-27 MED ORDER — HYDROCHLOROTHIAZIDE 25 MG PO TABS: ORAL_TABLET | ORAL | 3 refills | Status: DC

## 2023-06-27 MED ORDER — DICLOFENAC SODIUM 50 MG PO TBEC: 50.0000 mg | DELAYED_RELEASE_TABLET | Freq: Two times a day (BID) | ORAL | 3 refills | Status: DC

## 2023-06-27 MED ORDER — MONTELUKAST SODIUM 10 MG PO TABS
10.0000 mg | ORAL_TABLET | Freq: Every day | ORAL | 3 refills | Status: DC
Start: 2023-06-27 — End: 2024-07-02

## 2023-06-27 MED ORDER — METHYLPREDNISOLONE ACETATE 80 MG/ML IJ SUSP
80.0000 mg | Freq: Once | INTRAMUSCULAR | Status: AC
Start: 2023-06-27 — End: 2023-06-27
  Administered 2023-06-27: 80 mg via INTRA_ARTICULAR

## 2023-06-27 MED ORDER — ROSUVASTATIN CALCIUM 20 MG PO TABS: 20.0000 mg | ORAL_TABLET | Freq: Every day | ORAL | 3 refills | Status: DC

## 2023-06-27 NOTE — Progress Notes (Signed)
Ht 5\' 8"  (1.727 m)   BMI 28.13 kg/m    Subjective:   Patient ID: Shelly Bennett, female    DOB: Feb 10, 1951, 72 y.o.   MRN: [id number]  HPI: Shelly Bennett is a 72 y.o. female presenting on 06/27/2023 for Medical Management of Chronic Issues, Hyperlipidemia, Hypertension, and Neurodegenerative disorder (Has seen Neurology and Neuro Surgeon. Multiple labs and tests has been performed. Still unsure of dx and what has caused symptoms.)   HPI Hypertension Patient is currently on amlodipine 10 and hydrochlorothiazide, and their blood pressure today is 155/87. Patient denies any lightheadedness or dizziness. Patient denies headaches, blurred vision, chest pains, shortness of breath, or weakness. Denies any side effects from medication and is content with current medication.   Hyperlipidemia Patient is coming in for recheck of his hyperlipidemia. The patient is currently taking fish oil and Crestor. They deny any issues with myalgias or history of liver damage from it. They deny any focal numbness or weakness or chest pain.   Neurodegenerative disorder Patient is seeing neurology and neurosurgery for neurodegenerative disorder and they are doing multiple testing including spinal tap and other testing to try and figure out what is causing her issues.  Left knee strain Over the past 2 weeks she has been complaining about left knee pain and thinks she may have twisted it getting up and doing some exercise.  It has been hard on the medial aspect of the left knee.  She does have a neurodegenerative disorder which makes her weaker and they think that is why she went to [place] today.  She has been using topical Voltaren and takes an oral anti-inflammatory.  Relevant past medical, surgical, family and social history reviewed and updated as indicated. Interim medical history since our last visit reviewed. Allergies and medications reviewed and updated.  Review of Systems  Constitutional:   Negative for chills and fever.  Eyes:  Negative for redness and visual disturbance.  Respiratory:  Negative for chest tightness and shortness of breath.   Cardiovascular:  Negative for chest pain and leg swelling.  Genitourinary:  Negative for difficulty urinating and dysuria.  Musculoskeletal:  Positive for arthralgias, gait problem and joint swelling. Negative for back pain.  Skin:  Negative for rash.  Neurological:  Positive for weakness and numbness. Negative for light-headedness and headaches.  Psychiatric/Behavioral:  Negative for agitation and behavioral problems.   All other systems reviewed and are negative.   Per HPI unless specifically indicated above   Allergies as of 06/27/2023       Reactions   Neomycin-polymyxin-dexameth    Other reaction(s): eye redness   Codeine    ? Reaction ER visit.    Mold Extract [trichophyton]    Migraine   Penicillins Other (See Comments)   "drew my legs up as child" Pt has tolerated cephalexin in the past.   Ace Inhibitors Cough   Angiotensin Receptor Blockers Cough   Levaquin [levofloxacin] Rash   Per notes from outpatient provider   Vytorin [ezetimibe-simvastatin] Other (See Comments)   cramping   Zetia [ezetimibe] Other (See Comments)   cramping        Medication List        Accurate as of June 27, 2023 11:04 AM. If you have any questions, ask your nurse or doctor.          amLODipine 10 MG tablet Commonly known as: NORVASC Take 1 tablet (10 mg total) by mouth daily.   aspirin 81 MG tablet Take  81 mg by mouth daily.   buPROPion 150 MG 24 hr tablet Commonly known as: WELLBUTRIN XL Take 1 tablet (150 mg total) by mouth daily.   calcium carbonate 1500 (600 Ca) MG Tabs tablet Commonly known as: OSCAL Take 600 mg of elemental calcium by mouth daily.   diclofenac 50 MG EC tablet Commonly known as: VOLTAREN Take 1 tablet (50 mg total) by mouth 2 (two) times daily.   donepezil 10 MG tablet Commonly known as:  ARICEPT Take 1 tablet (10 mg total) by mouth at bedtime.   hydrochlorothiazide 25 MG tablet Commonly known as: HYDRODIURIL TAKE 1/2 TABLET DAILY What changed:  how much to take how to take this when to take this   memantine 10 MG tablet Commonly known as: NAMENDA Take 1 tablet (10 mg total) by mouth 2 (two) times daily.   montelukast 10 MG tablet Commonly known as: SINGULAIR Take 1 tablet (10 mg total) by mouth at bedtime.   omega-3 acid ethyl esters 1 g capsule Commonly known as: LOVAZA TAKE TWO CAPSULES TWICE DAILY What changed: See the new instructions.   polyethylene glycol 17 g packet Commonly known as: MIRALAX / GLYCOLAX Take 17 g by mouth 2 (two) times daily.   rosuvastatin 20 MG tablet Commonly known as: CRESTOR Take 1 tablet (20 mg total) by mouth daily.   senna-docusate 8.6-50 MG tablet Commonly known as: Senokot-S Take 1 tablet by mouth 2 (two) times daily.   Vitamin D-3 125 MCG (5000 UT) Tabs Take 1 capsule by mouth See admin instructions. Take one capsule by mouth daily Mon thru Friday         Objective:   Ht 5\' 8"  (1.727 m)   BMI 28.13 kg/m   Wt Readings from Last 3 Encounters:  05/02/23 185 lb (83.9 kg)  03/27/23 186 lb 8 oz (84.6 kg)  03/21/23 186 lb (84.4 kg)    Physical Exam Vitals and nursing note reviewed.  Constitutional:      General: She is not in acute distress.    Appearance: She is well-developed. She is not diaphoretic.  Eyes:     Conjunctiva/sclera: Conjunctivae normal.  Cardiovascular:     Rate and Rhythm: Normal rate and regular rhythm.     Heart sounds: Murmur (Systolic, 3 out of 6 murmur best heard at right second intercostal space) heard.  Pulmonary:     Effort: Pulmonary effort is normal. No respiratory distress.     Breath sounds: Normal breath sounds. No wheezing.  Musculoskeletal:        General: Normal range of motion.     Right knee: Normal. No tenderness.     Left knee: Crepitus present. Normal range of  motion. Tenderness present over the medial joint line. No LCL laxity, MCL laxity, ACL laxity or PCL laxity.Abnormal meniscus. Normal alignment and normal patellar mobility.  Skin:    General: Skin is warm and dry.     Findings: No rash.  Neurological:     Mental Status: She is alert and oriented to person, place, and time.     Coordination: Coordination normal.  Psychiatric:        Behavior: Behavior normal.     Knee injection: Consent form signed. Risk factors of bleeding and infection discussed with patient and patient is agreeable towards injection. Patient prepped with Betadine. Lateral approach towards injection used. Injected 80 mg of Depo-Medrol and 1 mL of 2% lidocaine. Patient tolerated procedure well and no side effects from noted. Minimal to no  bleeding. Simple bandage applied after.   Assessment & Plan:   Problem List Items Addressed This Visit       Cardiovascular and Mediastinum   Hypertension   Relevant Medications   hydrochlorothiazide (HYDRODIURIL) 25 MG tablet   rosuvastatin (CRESTOR) 20 MG tablet   Aortic atherosclerosis (HCC)   Relevant Medications   hydrochlorothiazide (HYDRODIURIL) 25 MG tablet   rosuvastatin (CRESTOR) 20 MG tablet   CAD (coronary artery disease), native coronary artery   Relevant Medications   hydrochlorothiazide (HYDRODIURIL) 25 MG tablet   rosuvastatin (CRESTOR) 20 MG tablet     Respiratory   Allergic rhinitis   Relevant Medications   montelukast (SINGULAIR) 10 MG tablet     Other   Hyperlipemia - Primary   Relevant Medications   hydrochlorothiazide (HYDRODIURIL) 25 MG tablet   rosuvastatin (CRESTOR) 20 MG tablet   Pain in left knee   Relevant Medications   diclofenac (VOLTAREN) 50 MG EC tablet   methylPREDNISolone acetate (DEPO-MEDROL) injection 80 mg   Other Visit Diagnoses     Acute bilateral low back pain without sciatica       Relevant Medications   diclofenac (VOLTAREN) 50 MG EC tablet   methylPREDNISolone acetate  (DEPO-MEDROL) injection 80 mg   Neurodegenerative disorder (HCC)       Relevant Medications   buPROPion (WELLBUTRIN XL) 150 MG 24 hr tablet   memantine (NAMENDA) 10 MG tablet   Other Relevant Orders   Ambulatory referral to Neurology       Blood pressure today but is difficult for her to get in, want him to check blood pressures over the next 2 weeks at home and call me with numbers.  Amlodipine was just increased by cardiology.  Knee injection done.  Referral to neurology at [facility]. Follow up plan: Return in about 6 months (around 12/28/2023), or if symptoms worsen or fail to improve, for Hypertension and hyperlipidemia and neurodegenerative disorder.  Counseling provided for all of the vaccine components Orders Placed This Encounter  Procedures   Ambulatory referral to Neurology    Arville Care, MD [facility] 06/27/2023, 11:04 AM

## 2023-07-05 ENCOUNTER — Ambulatory Visit: Payer: Medicare PPO | Admitting: Neurology

## 2023-07-05 ENCOUNTER — Ambulatory Visit (INDEPENDENT_AMBULATORY_CARE_PROVIDER_SITE_OTHER): Payer: Medicare PPO | Admitting: Neurology

## 2023-07-05 DIAGNOSIS — R2689 Other abnormalities of gait and mobility: Secondary | ICD-10-CM | POA: Diagnosis not present

## 2023-07-05 DIAGNOSIS — R4189 Other symptoms and signs involving cognitive functions and awareness: Secondary | ICD-10-CM | POA: Diagnosis not present

## 2023-07-05 DIAGNOSIS — R32 Unspecified urinary incontinence: Secondary | ICD-10-CM

## 2023-07-05 DIAGNOSIS — M48062 Spinal stenosis, lumbar region with neurogenic claudication: Secondary | ICD-10-CM | POA: Diagnosis not present

## 2023-07-05 DIAGNOSIS — M5440 Lumbago with sciatica, unspecified side: Secondary | ICD-10-CM

## 2023-07-05 DIAGNOSIS — R269 Unspecified abnormalities of gait and mobility: Secondary | ICD-10-CM

## 2023-07-05 DIAGNOSIS — M5416 Radiculopathy, lumbar region: Secondary | ICD-10-CM | POA: Diagnosis not present

## 2023-07-05 NOTE — Procedures (Signed)
Full Name: Shelly Bennett Gender: Female MRN #: [id number] Date of Birth: [birth year]    Visit Date: 07/05/2023 09:44 Age: 72 Years Examining Physician: Dr. Levert Feinstein Referring Physician: Dr. Levert Feinstein Height: 5 feet 9 inch History: 72 years old with chronic low back pain, progressive gait abnormalities.    Summary of tests: Nerve conduction studies:  Bilateral sural, superfacial peroneal sensory responses were normal.   Bilateral tibial, peroneal to EDB motor responses were normal.   Electromyography: Selected needle examination of bilateral lower extremity muscles and lumbar paraspinal muscles were normal.   Conclusion: This is a normal study. There is no evidence of large fiber peripheral neuropathy or active lumbar radiculopathies.    ------------------------------- Levert Feinstein, M.D.Ph.D.  [facility] [address] [place], [place] [address] Tel: [phone] Fax: [phone]  Verbal informed consent was obtained from the patient, patient was informed of potential risk of procedure, including bruising, bleeding, hematoma formation, infection, muscle weakness, muscle pain, numbness, among others.        MNC    Nerve / Sites Muscle Latency Ref. Amplitude Ref. Rel Amp Segments Distance Velocity Ref. Area    ms ms mV mV %  cm m/s m/s mVms  R Peroneal - EDB     Ankle EDB 4.4 ?6.5 4.1 ?2.0 100 Ankle - EDB 9   13.8     Fib head EDB 10.9  2.9  70.8 Fib head - Ankle 28.6 44 ?44 10.1     Pop fossa EDB 13.2  3.8  133 Pop fossa - Fib head 10 44 ?44 13.6         Pop fossa - Ankle      L Peroneal - EDB     Ankle EDB 5.3 ?6.5 3.4 ?2.0 100 Ankle - EDB 9   11.3     Fib head EDB 12.6  2.5  73.6 Fib head - Ankle 30 41 ?44 8.8     Pop fossa EDB 14.2  3.0  117 Pop fossa - Fib head 9 55 ?44 11.0         Pop fossa - Ankle      R Tibial - AH     Ankle AH 5.1 ?5.8 4.1 ?4.0 100 Ankle - AH 9   15.9     Pop fossa AH 14.3  2.1  52.3 Pop fossa - Ankle  39 42 ?41 6.7  L Tibial - AH     Ankle AH 4.5 ?5.8 4.9 ?4.0 100 Ankle - AH 9   23.3     Pop fossa AH 14.4  2.2  43.9 Pop fossa - Ankle 40 41 ?41 8.8             SNC    Nerve / Sites Rec. Site Peak Lat Ref.  Amp Ref. Segments Distance    ms ms V V  cm  R Sural - Ankle (Calf)     Calf Ankle 3.8 ?4.4 7 ?6 Calf - Ankle 14  L Sural - Ankle (Calf)     Calf Ankle 3.8 ?4.4 7 ?6 Calf - Ankle 14  R Superficial peroneal - Ankle     Lat leg Ankle 4.2 ?4.4 5 ?6 Lat leg - Ankle 14  L Superficial peroneal - Ankle     Lat leg Ankle 3.9 ?4.4 8 ?6 Lat leg - Ankle 14             F  Wave  Nerve F Lat Ref.   ms ms  R Tibial - AH 56.3 ?56.0  L Tibial - AH 57.4 ?56.0         EMG Summary Table    Spontaneous MUAP Recruitment  Muscle IA Fib PSW Fasc Other Amp Dur. Poly Pattern  R. Tibialis anterior Normal None None None _______ Normal Normal Normal Normal  R. Tibialis posterior Normal None None None _______ Normal Normal Normal Normal  R. Peroneus longus Normal None None None _______ Normal Normal Normal Normal  R. Gastrocnemius (Medial head) Normal None None None _______ Normal Normal Normal Normal  R. Vastus lateralis Normal None None None _______ Normal Normal Normal Normal  L. Tibialis anterior Normal None None None _______ Normal Normal Normal Normal  L. Tibialis posterior Normal None None None _______ Normal Normal Normal Normal  L. Peroneus longus Normal None None None _______ Normal Normal Normal Normal  L. Gastrocnemius (Medial head) Normal None None None _______ Normal Normal Normal Normal  L. Vastus lateralis Normal None None None _______ Normal Normal Normal Normal  R. Lumbar paraspinals (low) Normal None None None _______ Normal Normal Normal Normal  R. Lumbar paraspinals (mid) Normal None None None _______ Normal Normal Normal Normal  L. Lumbar paraspinals (low) Normal None None None _______ Normal Normal Normal Normal  L. Lumbar paraspinals (mid) Normal None None None _______  Normal Normal Normal Normal

## 2023-07-05 NOTE — Progress Notes (Addendum)
ASSESSMENT AND PLAN  Shelly Bennett is a 72 y.o. female   Cognitive impairment   Gait abnormality Most consistent with central nervous system degenerative disorder,  Personally reviewed and compared to previous MRIs brain scans, mild small vessel disease, moderate atrophy, ventriculomegaly, overall stable compared to previous scan in 2020,  MRI cervical spine in May 2021 showed multilevel degenerative changes, with mild canal stenosis variable degree of foraminal narrowing  MRI of lumbar spine showed moderate stenosis L2-3, moderate to severe stenosis L3-4, variable degree of foraminal stenosis,  DaTSCAN was negative in March 2024, could not tolerate Sinemet due to GI side effect, did not provide help either,  Her gait abnormality multifactorial, brain atrophy, ventriculomegaly  likely play major role, but has been present since 2020, large volume LP on May 16th 2024 did not show significant improvement,  deconditioning, slow reaction time due to worsening cognitive impairment, moderate low back pain, left hip, knee pain all contributed too her gait abnormality.  Will start OP PT  Refer to [facility] for 2nd opinion    DIAGNOSTIC DATA (LABS, IMAGING, TESTING) - I reviewed patient records, labs, notes, testing and imaging myself where available.  Neurosurgery evaluation by Dr. Cindee Salt on May 10, 2023, patient with large-volume lumbar puncture, not a VP shunt candidate, moderate to severe lumbar stenosis, consider left L2-3 epidural injection for low back pain,  Addendum, evaluation at [facility] on July 25, 2023 by Dr.Moore, [name]: Unclear diagnosis, may be Alzheimer's disease as part of the issue along with knee pain, was referred to cognitive neurologist, concerning lumbar puncture to assess disorder,  MEDICAL HISTORY: Shelly Bennett is a 72 years old female, seen in refer by her primary care doctor [name] for evaluation of anxiety, memory loss, initial  evaluation was on November 6th 2018.    PMHx  of hypertension, coronary artery disease, hyperlipidemia,   She is a retired Tourist information centre manager. She drove herself to clinic today.   She reported stress, she is the main caretaker of her mother that is 53 years old, since 2017, she was noted to repeat herself, forget peoples name, displaced pains,   Since 2018, she also suffered depression anxiety, because of the strained relationship, she is taking BuSpar, and Paxil since August 2018 which seems to help her some,   She has mild gait abnormality due to previous left knee injury.   Laboratory evaluations, B12 474, normal liver functional tests, vitamin D level 57, lipid profile LDL 82, total cholesterol 784,   UPDATE Feb 6th 2019: She continue has mild memory loss, Mini-Mental Status Examination 28/30, MRI of the brain showed moderate generalized atrophy, ventriculomegaly mild supratentorium small vessel disease   She also complains of chronic left low back pain radiating pain to left hip, I have personally reviewed MRI lumbar in 2016, multilevel degenerative changes, most severe at L3-4, with moderate spinal stenosis, bilateral lateral recess stenosis.   UPDATE August 6th 2019: She still has mild memory loss, misplace things, her mother passed away on 05/26/2018, her left leg pain, low back pain has much improved after physical therapy   UPDATE March 10 2020: She is accompanied by her daughter [name] at today's visit, she had worsening gait abnormality, memory loss, initially contributed to her depression of losing her mother in 2020, but her symptoms continue to progress over the past 1 year, especially her gait abnormality, she fell few times in her yard, went to emergency room on February 10, 2020, fell about 4 feet off the edge of her porch, landed on her right side   She denies significant low back pain, denies radiating pain to bilateral lower extremity, or lower extremity  paresthesia, denies bowel bladder incontinence   I personally reviewed MRI of the brain in December 2020, generalized atrophy, mild supratentorial and small vessel disease, there was no acute abnormalities.   Laboratory evaluations in December 2020, positive COVID-19, normal and active RPR, Lyme titer, B12, ESR, arthritis panel, vitamin D, CBC, CMP elevated high-sensitivity C-reactive protein 4.8, positive scleroderma antibody, normal thyroid functional test   I personally reviewed MRI of lumbar spine in December 2019, severe spinal stenosis at L3-4, due to circumferential disc bulging, moderate facet and ligamentum flavum hypertrophy, with progressive severe spinal stenosis, mild left greater than right lateral recess stenosis, acute mild L1 and 2 compression fracture   UPDATE June 16 2020: She is accompanied by her daughter [name] at today's clinical visit, her memory is relatively stable, Mini-Mental Status Examination in March was 28/30,   She was noted to have mild worsening of gait abnormality, contributed to her low back, hip pain We also personally reviewed MRI of the cervical spine in May 2021, multilevel degenerative changes, but no evidence of significant canal or foraminal stenosis.   MRI of brain in December 2020, no acute abnormality, mild supratentorium small vessel disease, unchanged mild enlarged bilateral lateral ventricle and third ventricle, mildly out of proportion to generalized brain atrophy, but no significant change compared to previous MRI in 2018  UPDATE Jan 24 2023: She was fairly stable before acute event in December 2023, 1 day she was found on the kitchen floor, with bowel and bladder incontinence, apparently she was trying to get up in the middle of the night, patient has no recollection of the event,  She remained confused for the next 2 days, does not know where she was, increased gait abnormality, was seen by primary care physician, Personally reviewed MRI of the  brain November 29, 2022, no acute intracranial abnormality, mild small vessel disease moderate cerebral atrophy, compared to previous MRIs in 2020, overall no significant change  Daughter reported a week prior to the incident, she was still driving, attending Christmas concert,  Since the event, she has to rely on her walker, confused, difficulty following recipe, increased gait abnormality, complains of low back pain, bowel and bladder incontinence, has to wear depends  We reviewed previous imaging studies, MRI cervical spine May 2021, multilevel degenerative changes, no significant canal stenosis, variable degree of foraminal narrowing at C3-4, C4-5,  MRI of lumbar in December 2019, acute mild L1-2 compression fracture, multilevel degenerative changes most noticeable at L3-4 with severe spinal stenosis  UPDATE March 27 2023: Is accompanied by her husband and daughter at today's clinical visit, presented to hospital on March 26 after fell twice in 1 day, stepping out of the car, getting up after lying in bed for a while, orthostatic blood pressure was negative during hospital stay, negative at today's visit as well  She complains of worsening left hip, knee pain, low back pain since fall  We personally reviewed MRI of cervical spine March 07, 2023 multilevel degenerative changes, no significant canal stenosis, moderate to severe foraminal narrowing at C5-6, worse on the right  MRI of lumbar spine moderate central canal stenosis L2-3, moderately severe canal stenosis L3-4, slight worsening compared to previous exam  MRI of the brain December 2023, no acute abnormality, mild small vessel disease, moderate cerebral atrophy,  evidence of ventriculomegaly, stable compared to previous scan in 2020  DaTSCAN March 13, 2023 showed no evidence of reduced radiotracer activity at basal ganglion, she was giving a trial of sinemet, could not tolerated due to GI side effect, did not provide help  either  Laboratory evaluation showed mild low potassium 3.0 initially, has no significant abnormality.  UPDATE May 02 2023:  Neurosurgeon evaluation by Dr. Marliss Czar on April 15, 2023, progressive neurodegenerative disorder, decreased cognitive function, Long discussion with patient and family, with the stability of her ventricular size, suspicious for normal pressure hydrocephalus is not high, nevertheless, with her significant magnetic gait, functional disability, do suggest high-volume lumbar puncture for further evaluation,  Family decided to proceed with large-volume lumbar puncture, on schedule today, accompanied by husband and daughter at today's clinical visit, pre and post lumbar puncture evaluation, complains of 5 out of 10 right knee and left hip pain, gait abnormality,  MoCA 19/30 pre-LP, will repeat post LP, spinal fluid testing including phosphorylated tau, a beta 42 analysis,  I evaluated patient before and right after lumbar puncture, there was no significant change noted in her gait abnormality, cognitive function,  UPDATE July 05 2023: She return for EMG/NCS study today, which is essential normal, there was no large fiber neuropathy, active lumbar radiculopathy. Again reviewed history with patient and husband, slow worsening gait abnormality since 2020, acute worsening since Nov 2023, upper respiratory infection, cough for one month.   CSF on May 02 2023:  normal protein,  glucose, cell count  PHYSICAL EXAM:   160/84, HR55 PHYSICAL EXAMNIATION:  Gen: NAD, conversant, well nourised, well groomed                     Cardiovascular: Regular rate rhythm, no peripheral edema, warm, nontender. Eyes: Conjunctivae clear without exudates or hemorrhage Neck: Supple, no carotid bruits. Pulmonary: Clear to auscultation bilaterally   NEUROLOGICAL EXAM:  MENTAL STATUS: Speech/cognition: Awake, alert, oriented to history taking and casual conversation     05/02/2023    11:00 AM 05/02/2023    9:00 AM 03/27/2023    9:00 AM  Montreal Cognitive Assessment   Visuospatial/ Executive (0/5) 3 3 1   Naming (0/3) 3 3 3   Attention: Read list of digits (0/2) 2 2 2   Attention: Read list of letters (0/1) 1 1 0  Attention: Serial 7 subtraction starting at 100 (0/3) 1 3 2   Language: Repeat phrase (0/2) 2 2 2   Language : Fluency (0/1) 1 1 1   Abstraction (0/2) 2 2 2   Delayed Recall (0/5) 1 1 0  Orientation (0/6) 4 1 5   Total 20 19 18     CRANIAL NERVES: CN II: Visual fields are full to confrontation. Pupils are round equal and briskly reactive to light. CN III, IV, VI: extraocular movement are normal. No ptosis. CN V: Facial sensation is intact to light touch CN VII: Face is symmetric with normal eye closure  CN VIII: Hearing is normal to causal conversation. CN IX, X: Phonation is normal. CN XI: Head turning and shoulder shrug are intact  MOTOR: Left shoulder pain limited proximal upper extremity strength no significant upper extremity weakness  Mild limitation of left lower extremity movement due to left hip and knee pain, felt there was no significant weakness, I did not notice significant rigidity or bradykinesia  REFLEXES: Brisk reflex, more noticeable at left upper and lower extremity, bilateral Babinski sign  SENSORY: Intact to light touch,   COORDINATION: There is no trunk  or limb dysmetria noted.  GAIT/STANCE: Need push-up to get up from seated position, wide-based , antalgic, cautious, difficulty initiate gait  REVIEW OF SYSTEMS:  Full 14 system review of systems performed and notable only for as above All other review of systems were negative.   ALLERGIES: Allergies  Allergen Reactions   Neomycin-Polymyxin-Dexameth     Other reaction(s): eye redness   Codeine     ? Reaction ER visit.    Mold Extract [Trichophyton]     Migraine   Penicillins Other (See Comments)    "drew my legs up as child" Pt has tolerated cephalexin in the past.   Ace  Inhibitors Cough   Angiotensin Receptor Blockers Cough   Levaquin [Levofloxacin] Rash    Per notes from outpatient provider   Vytorin [Ezetimibe-Simvastatin] Other (See Comments)    cramping   Zetia [Ezetimibe] Other (See Comments)    cramping    HOME MEDICATIONS: Current Outpatient Medications  Medication Sig Dispense Refill   amLODipine (NORVASC) 10 MG tablet Take 1 tablet (10 mg total) by mouth daily. 90 tablet 3   aspirin 81 MG tablet Take 81 mg by mouth daily.      buPROPion (WELLBUTRIN XL) 150 MG 24 hr tablet Take 1 tablet (150 mg total) by mouth daily. 90 tablet 3   calcium carbonate (OSCAL) 1500 (600 Ca) MG TABS tablet Take 600 mg of elemental calcium by mouth daily.     Cholecalciferol (VITAMIN D-3) 5000 UNITS TABS Take 1 capsule by mouth See admin instructions. Take one capsule by mouth daily Mon thru Friday     diclofenac (VOLTAREN) 50 MG EC tablet Take 1 tablet (50 mg total) by mouth 2 (two) times daily. 180 tablet 3   donepezil (ARICEPT) 10 MG tablet Take 1 tablet (10 mg total) by mouth at bedtime. 90 tablet 3   hydrochlorothiazide (HYDRODIURIL) 25 MG tablet TAKE 1/2 TABLET DAILY 45 tablet 3   memantine (NAMENDA) 10 MG tablet Take 1 tablet (10 mg total) by mouth 2 (two) times daily. 180 tablet 3   montelukast (SINGULAIR) 10 MG tablet Take 1 tablet (10 mg total) by mouth at bedtime. 90 tablet 3   omega-3 acid ethyl esters (LOVAZA) 1 g capsule TAKE TWO CAPSULES TWICE DAILY (Patient taking differently: Take 2 g by mouth 2 (two) times daily.) 360 capsule 3   polyethylene glycol (MIRALAX / GLYCOLAX) 17 g packet Take 17 g by mouth 2 (two) times daily. 14 each 0   rosuvastatin (CRESTOR) 20 MG tablet Take 1 tablet (20 mg total) by mouth daily. 90 tablet 3   senna-docusate (SENOKOT-S) 8.6-50 MG tablet Take 1 tablet by mouth 2 (two) times daily.     No current facility-administered medications for this visit.    PAST MEDICAL HISTORY: Past Medical History:  Diagnosis Date    Adenomatous colon polyp    Allergic drug reaction 06/25/2015   Allergy    Anxiety    Arthritis    At risk for falls    fallen 4 times recently    CAD (coronary artery disease)    Dr. Excell Seltzer follows    Carpal tunnel syndrome    Chronic headaches    Coccydynia 02/01/2012   Cystocele    Diverticulosis    External hemorrhoids    Focal nodular hyperplasia of liver    Gait abnormality    GERD (gastroesophageal reflux disease)    some   HCAP (healthcare-associated pneumonia) 06/25/2015   Heart murmur    HLD (hyperlipidemia)  HTN (hypertension)    IBS (irritable bowel syndrome)    Memory loss    Neuromuscular disorder (HCC)    some neuropathy issues in the past- legs fibromyalgia in past-- past hx of steroid injections   Obesity    Osteoporosis    Otitis of left ear 02/01/2017   Pneumonia    Skin cancer    squamous cell - face    PAST SURGICAL HISTORY: Past Surgical History:  Procedure Laterality Date   CARPAL TUNNEL RELEASE     COLONOSCOPY  12/15/2008   diverticulosis, external hemorrhoids   KNEE SURGERY  05/22/2015   right   LAPAROSCOPY     SKIN SURGERY     squamous cell - right face     FAMILY HISTORY: Family History  Problem Relation Age of Onset   Hyperlipidemia Mother    Hypertension Mother    Kidney disease Mother    Osteoporosis Mother    Heart murmur Mother    Dementia Mother    Prostate cancer Father    Colon cancer Father 51   Kidney disease Father    Heart disease Father    Alzheimer's disease Father    Hyperlipidemia Sister    Hypertension Sister    Rectal cancer Maternal Grandmother 33   Heart disease Maternal Grandfather    Heart disease Brother 69       not dx questionable    Colon polyps Neg Hx    Esophageal cancer Neg Hx    Stomach cancer Neg Hx    Breast cancer Neg Hx     SOCIAL HISTORY: Social History   Socioeconomic History   Marital status: Married    Spouse name: Maisie Fus   Number of children: 4   Years of education: 16    Highest education level: Bachelor's degree (e.g., BA, AB, BS)  Occupational History   Occupation: Runner, broadcasting/film/video retired  Tobacco Use   Smoking status: Never   Smokeless tobacco: Never  Vaping Use   Vaping status: Never Used  Substance and Sexual Activity   Alcohol use: No   Drug use: No   Sexual activity: Not Currently    Birth control/protection: None  Other Topics Concern   Not on file  Social History Narrative   Lives at home with her husband.   Right-handed.   1 cup coffee each morning. 2-3 small soda cans each day.   Social Determinants of Health   Financial Resource Strain: Low Risk  (07/24/2022)   Overall Financial Resource Strain (CARDIA)    Difficulty of Paying Living Expenses: Not hard at all  Food Insecurity: No Food Insecurity (03/12/2023)   Hunger Vital Sign    Worried About Running Out of Food in the Last Year: Never true    Ran Out of Food in the Last Year: Never true  Transportation Needs: No Transportation Needs (03/12/2023)   PRAPARE - Administrator, Civil Service (Medical): No    Lack of Transportation (Non-Medical): No  Physical Activity: Insufficiently Active (07/24/2022)   Exercise Vital Sign    Days of Exercise per Week: 6 days    Minutes of Exercise per Session: 20 min  Stress: No Stress Concern Present (07/24/2022)   [employer] of Occupational Health - Occupational Stress Questionnaire    Feeling of Stress : Only a little  Social Connections: Socially Integrated (07/24/2022)   Social Connection and Isolation Panel [NHANES]    Frequency of Communication with Friends and Family: More than three times a week  Frequency of Social Gatherings with Friends and Family: More than three times a week    Attends Religious Services: More than 4 times per year    Active Member of [employer] or Organizations: Yes    Attends Banker Meetings: More than 4 times per year    Marital Status: Married  Catering manager Violence: Not At Risk (03/12/2023)    Humiliation, Afraid, Rape, and Kick questionnaire    Fear of Current or Ex-Partner: No    Emotionally Abused: No    Physically Abused: No    Sexually Abused: No      Levert Feinstein, M.D. Ph.D.  [facility] [address], [address] [place], [place] [address] Ph: [phone] Fax: [phone]  CC:  Dettinger, Elige Radon, MD [address] [place],  [place] [address]  Dettinger, Elige Radon, MD

## 2023-07-08 ENCOUNTER — Telehealth: Payer: Self-pay | Admitting: Neurology

## 2023-07-08 NOTE — Telephone Encounter (Signed)
Referral faxed to [facility]: Phone: [phone] Fax: [phone]

## 2023-07-10 ENCOUNTER — Encounter: Payer: Medicare PPO | Admitting: Neurology

## 2023-07-11 ENCOUNTER — Ambulatory Visit: Payer: Medicare PPO | Admitting: Neurology

## 2023-07-12 ENCOUNTER — Encounter: Payer: Self-pay | Admitting: Physical Therapy

## 2023-07-12 ENCOUNTER — Ambulatory Visit: Payer: Medicare PPO | Attending: Neurology | Admitting: Physical Therapy

## 2023-07-12 ENCOUNTER — Other Ambulatory Visit: Payer: Self-pay

## 2023-07-12 DIAGNOSIS — M5440 Lumbago with sciatica, unspecified side: Secondary | ICD-10-CM | POA: Diagnosis not present

## 2023-07-12 DIAGNOSIS — R2689 Other abnormalities of gait and mobility: Secondary | ICD-10-CM | POA: Insufficient documentation

## 2023-07-12 DIAGNOSIS — M6281 Muscle weakness (generalized): Secondary | ICD-10-CM | POA: Diagnosis not present

## 2023-07-12 DIAGNOSIS — R2681 Unsteadiness on feet: Secondary | ICD-10-CM | POA: Insufficient documentation

## 2023-07-12 DIAGNOSIS — R269 Unspecified abnormalities of gait and mobility: Secondary | ICD-10-CM | POA: Insufficient documentation

## 2023-07-12 DIAGNOSIS — R4189 Other symptoms and signs involving cognitive functions and awareness: Secondary | ICD-10-CM | POA: Insufficient documentation

## 2023-07-12 DIAGNOSIS — R32 Unspecified urinary incontinence: Secondary | ICD-10-CM | POA: Insufficient documentation

## 2023-07-12 DIAGNOSIS — Z9181 History of falling: Secondary | ICD-10-CM | POA: Diagnosis not present

## 2023-07-12 NOTE — Therapy (Signed)
OUTPATIENT PHYSICAL THERAPY LOWER EXTREMITY EVALUATION   Patient Name: Shelly Bennett MRN: [id number] DOB:[birth year], 72 y.o., female Today's Date: 07/12/2023  END OF SESSION:  PT End of Session - 07/12/23 1000     Visit Number 1    Number of Visits 17    Date for PT Re-Evaluation 09/06/23    Authorization Type [employer] MCR    Authorization Time Period 07/12/23 to 09/06/23    Progress Note Due on Visit 10    PT Start Time 0932    PT Stop Time 1013    PT Time Calculation (min) 41 min    Activity Tolerance Patient tolerated treatment well    Behavior During Therapy [facility] for tasks assessed/performed             Past Medical History:  Diagnosis Date   Adenomatous colon polyp    Allergic drug reaction 06/25/2015   Allergy    Anxiety    Arthritis    At risk for falls    fallen 4 times recently    CAD (coronary artery disease)    Dr. Excell Seltzer follows    Carpal tunnel syndrome    Chronic headaches    Coccydynia 02/01/2012   Cystocele    Diverticulosis    External hemorrhoids    Focal nodular hyperplasia of liver    Gait abnormality    GERD (gastroesophageal reflux disease)    some   HCAP (healthcare-associated pneumonia) 06/25/2015   Heart murmur    HLD (hyperlipidemia)    HTN (hypertension)    IBS (irritable bowel syndrome)    Memory loss    Neuromuscular disorder (HCC)    some neuropathy issues in the past- legs fibromyalgia in past-- past hx of steroid injections   Obesity    Osteoporosis    Otitis of left ear 02/01/2017   Pneumonia    Skin cancer    squamous cell - face   Past Surgical History:  Procedure Laterality Date   CARPAL TUNNEL RELEASE     COLONOSCOPY  12/15/2008   diverticulosis, external hemorrhoids   KNEE SURGERY  05/22/2015   right   LAPAROSCOPY     SKIN SURGERY     squamous cell - right face    Patient Active Problem List   Diagnosis Date Noted   Dementia (HCC) 05/02/2023   Frequent falls 03/12/2023   Parkinsonism 01/24/2023    Urinary incontinence 01/24/2023   Pain in right knee 12/05/2022   Pain in left knee 10/23/2022   Bilateral primary osteoarthritis of knee 10/04/2022   Gait abnormality 03/10/2020   Gastroesophageal reflux disease 10/19/2019   Third degree uterine prolapse 10/19/2019   Spinal stenosis of lumbar region without neurogenic claudication 09/15/2019   Urinary and fecal incontinence 09/15/2019   Cystocele with rectocele 09/15/2019   Low back pain with sciatica 07/22/2018   Left lumbar radiculopathy 01/22/2018   Cognitive impairment 10/22/2017   Anxiety 10/22/2017   Aortic atherosclerosis (HCC) 12/04/2016   Vitamin D deficiency 08/19/2014   Osteopenia 08/12/2013   CAD (coronary artery disease), native coronary artery 03/22/2013   Aortic valve stenosis, mild 03/22/2013   Hyperlipemia 03/11/2013   Hypertension 03/11/2013   Allergic rhinitis 03/11/2013   IBS (irritable bowel syndrome) - with chronic recurrent abdominal pain 02/01/2012   Personal history of colonic polyps - adenoma 02/01/2012    PCP: Arville Care MD   REFERRING PROVIDER: Levert Feinstein, MD  REFERRING DIAG: R41.89 (ICD-10-CM) - Cognitive impairment R32 (ICD-10-CM) - Urinary incontinence, unspecified type M54.40 (  ICD-10-CM) - Low back pain with sciatica, sciatica laterality unspecified, unspecified back pain laterality, unspecified chronicity R26.9 (ICD-10-CM) - Gait abnormality  THERAPY DIAG:  Unsteadiness on feet  Muscle weakness (generalized)  Other abnormalities of gait and mobility  History of falling  Rationale for Evaluation and Treatment: Rehabilitation  ONSET DATE: chronic   SUBJECTIVE:   SUBJECTIVE STATEMENT: My legs just tingle and everything, I've not been able to walk well for a long time without the walker. I've been to 2 neurologists, one wanted to refer me to [facility] (that was Dr. Terrace Arabia), the other doctor Dr. Maisie Fus said that he's leaving the door open for surgery on my back. Had a fall were I really  slammed my arm, I was using the rollator and was rushing to get in the car and just hit the ground, this was last week when all the computer things happened. Was tested for parkinsons but this test came back negative.   PERTINENT HISTORY: Anxiety, hx falls, CAD, carpal tunnel, gait abnormality, HCAP, heart murmur, HLD, HTN, IBS, memory loss, neuromuscular disorder, knee surgery, Parkinsonism  PAIN:  Are you having pain? No 0/10, sometimes has back pain and L LE numbness   PRECAUTIONS: None  RED FLAGS: None   WEIGHT BEARING RESTRICTIONS: No  FALLS:  Has patient fallen in last 6 months? Yes. Number of falls 1 about a week ago   LIVING ENVIRONMENT: Lives with: lives with their family Lives in: House/apartment Stairs:  level entry to carport  Has following equipment at home: Environmental consultant - 4 wheeled and Wheelchair (manual)  OCCUPATION: retired  PLOF: Independent, Independent with household mobility with device, Independent with community mobility with device, and Requires assistive device for independence  PATIENT GOALS: be able to get legs working/be able to walk better   NEXT MD VISIT: October 10th with referring   OBJECTIVE:     PATIENT SURVEYS:  FOTO will take 2nd session   COGNITION: Overall cognitive status: Within functional limits for tasks assessed     SENSATION: L LE numbness all the time "feels like dragging a cinder block"          LOWER EXTREMITY MMT:  MMT Right eval Left eval  Hip flexion 3 3  Hip extension    Hip abduction 3 at best 3 at best   Hip adduction    Hip internal rotation    Hip external rotation    Knee flexion 4 4-  Knee extension 4+ 4  Ankle dorsiflexion 5 5  Ankle plantarflexion    Ankle inversion    Ankle eversion     (Blank rows = not tested)    FUNCTIONAL TESTS:  5 times sit to stand: 27 seconds BUEs on rollator posterior LOB with initial power up to stand  Timed up and go (TUG): 40 seconds with rollator    GAIT: Distance walked: in clinic distances  Assistive device utilized: Environmental consultant - 4 wheeled Level of assistance: Modified independence Comments: L LE with very short step lengths, tends to stick and freeze with poor progression, trunk flexed, limited stance time R LE, tends to push rollator too far ahead, some freezing and difficulty noted on turns    TODAY'S TREATMENT:  DATE:   Eval  Objective measures, care planning, appropriate education  TherEx  Nustep L3 x10 minutes BUEs/BLEs  Seated clams red TB x10 Seated marches red TB x10  STS x3 focus on BOS and forward head to reduce posterior LOB during transition     PATIENT EDUCATION:  Education details: exam findings, HEP, POC  Person educated: Patient Education method: Explanation, Demonstration, and Handouts Education comprehension: verbalized understanding, returned demonstration, and needs further education  HOME EXERCISE PROGRAM: Access Code: [email or web address] URL: [email or web address] Date: 07/12/2023 Prepared by: Nedra Hai  Exercises - Seated Hip Abduction with Resistance  - 2 x daily - 7 x weekly - 1 sets - 10 reps - 2 seconds hold - Seated March with Resistance  - 2 x daily - 7 x weekly - 1 sets - 10 reps - 2 seconds hold - Sit to Stand with Armchair  - 2 x daily - 7 x weekly - 1 sets - 10 reps  ASSESSMENT:  CLINICAL IMPRESSION: Patient is a 72 y.o. F who was seen today for physical therapy evaluation and treatment for low back pain and gait abnormality. Exam with objective findings as above. Will really benefit from skilled PT services to optimize overall level of function and minimize fall risk moving forward.    OBJECTIVE IMPAIRMENTS: Abnormal gait, decreased activity tolerance, decreased balance, decreased coordination, decreased knowledge of use of DME, decreased mobility,  difficulty walking, decreased strength, improper body mechanics, postural dysfunction, and pain.   ACTIVITY LIMITATIONS: bending, standing, squatting, stairs, transfers, and locomotion level  PARTICIPATION LIMITATIONS: driving, shopping, and community activity  PERSONAL FACTORS: Age, Behavior pattern, Fitness, Past/current experiences, and Time since onset of injury/illness/exacerbation are also affecting patient's functional outcome.   REHAB POTENTIAL: Fair chronicity of impairments   CLINICAL DECISION MAKING: Evolving/moderate complexity  EVALUATION COMPLEXITY: Moderate   GOALS: Goals reviewed with patient? Yes  SHORT TERM GOALS: Target date: 08/09/2023   Will be compliant with appropriate progressive HEP  Baseline: Goal status: INITIAL  2.  Step lengths/stance times to be equal B and will be able to ambulate more consistently with upright posture/appropriate distance from rollator  Baseline:  Goal status: INITIAL  3.  Will report no more than 1 fall in the past 4 weeks  Baseline:  Goal status: INITIAL  4.  Will be able to complete TUG test in 20 seconds or less LRAD  Baseline:  Goal status: INITIAL    LONG TERM GOALS: Target date: 09/06/2023    MMT to improve by at least 1 grade all weak groups  Baseline:  Goal status: INITIAL  2.  Will report no falls within the past 4 weeks  Baseline:  Goal status: INITIAL  3.  Will score at least 48 on Berg balance test  Baseline:  Goal status: INITIAL  4.  Will be able to ambulate community distances with fatigue no more than 3/10, appropriate use of LRAD Baseline:  Goal status: INITIAL  5.  Will be compliant with appropriate long term exercise program (land based vs water based vs silver sneakers group class) to maintain functional gains and prevent return of functional impairments  Baseline:  Goal status: INITIAL  6.  Will be able to complete 5xSTS test in 15 seconds or less no posterior LOB  Baseline:  Goal  status: INITIAL   PLAN:  PT FREQUENCY: 2x/week  PT DURATION: 8 weeks  PLANNED INTERVENTIONS: Therapeutic exercises, Therapeutic activity, Neuromuscular re-education, Gait training, Self Care, and Manual therapy  PLAN FOR NEXT SESSION:  Nustep for reciprocal movement training, gait and balance training, strength, gait training outdoors as well. Try RW instead of 4WW and see if she's a bit safer with it?   Nedra Hai, PT, DPT 07/12/23 11:14 AM

## 2023-07-17 ENCOUNTER — Encounter: Payer: Self-pay | Admitting: Physical Therapy

## 2023-07-17 ENCOUNTER — Ambulatory Visit: Payer: Medicare PPO

## 2023-07-17 DIAGNOSIS — R2689 Other abnormalities of gait and mobility: Secondary | ICD-10-CM | POA: Diagnosis not present

## 2023-07-17 DIAGNOSIS — R2681 Unsteadiness on feet: Secondary | ICD-10-CM | POA: Diagnosis not present

## 2023-07-17 DIAGNOSIS — M5440 Lumbago with sciatica, unspecified side: Secondary | ICD-10-CM | POA: Diagnosis not present

## 2023-07-17 DIAGNOSIS — R269 Unspecified abnormalities of gait and mobility: Secondary | ICD-10-CM | POA: Diagnosis not present

## 2023-07-17 DIAGNOSIS — M6281 Muscle weakness (generalized): Secondary | ICD-10-CM | POA: Diagnosis not present

## 2023-07-17 DIAGNOSIS — R4189 Other symptoms and signs involving cognitive functions and awareness: Secondary | ICD-10-CM | POA: Diagnosis not present

## 2023-07-17 DIAGNOSIS — Z9181 History of falling: Secondary | ICD-10-CM

## 2023-07-17 DIAGNOSIS — R32 Unspecified urinary incontinence: Secondary | ICD-10-CM | POA: Diagnosis not present

## 2023-07-17 NOTE — Therapy (Addendum)
OUTPATIENT PHYSICAL THERAPY LOWER EXTREMITY TREATMENT   Patient Name: Shelly Bennett MRN: [id number] DOB:[birth year], 72 y.o., female Today's Date: 07/17/2023  END OF SESSION:  PT End of Session - 07/17/23 1103     Visit Number 2    Number of Visits 17    Date for PT Re-Evaluation 09/06/23    Authorization Type [employer] MCR    Authorization Time Period 07/12/23 to 09/06/23    Progress Note Due on Visit 10    PT Start Time 1102    PT Stop Time 1135    PT Time Calculation (min) 33 min    Activity Tolerance Patient tolerated treatment well    Behavior During Therapy [facility] for tasks assessed/performed            Past Medical History:  Diagnosis Date   Adenomatous colon polyp    Allergic drug reaction 06/25/2015   Allergy    Anxiety    Arthritis    At risk for falls    fallen 4 times recently    CAD (coronary artery disease)    Dr. Excell Seltzer follows    Carpal tunnel syndrome    Chronic headaches    Coccydynia 02/01/2012   Cystocele    Diverticulosis    External hemorrhoids    Focal nodular hyperplasia of liver    Gait abnormality    GERD (gastroesophageal reflux disease)    some   HCAP (healthcare-associated pneumonia) 06/25/2015   Heart murmur    HLD (hyperlipidemia)    HTN (hypertension)    IBS (irritable bowel syndrome)    Memory loss    Neuromuscular disorder (HCC)    some neuropathy issues in the past- legs fibromyalgia in past-- past hx of steroid injections   Obesity    Osteoporosis    Otitis of left ear 02/01/2017   Pneumonia    Skin cancer    squamous cell - face   Past Surgical History:  Procedure Laterality Date   CARPAL TUNNEL RELEASE     COLONOSCOPY  12/15/2008   diverticulosis, external hemorrhoids   KNEE SURGERY  05/22/2015   right   LAPAROSCOPY     SKIN SURGERY     squamous cell - right face    Patient Active Problem List   Diagnosis Date Noted   Dementia (HCC) 05/02/2023   Frequent falls 03/12/2023   Parkinsonism 01/24/2023   Urinary  incontinence 01/24/2023   Pain in right knee 12/05/2022   Pain in left knee 10/23/2022   Bilateral primary osteoarthritis of knee 10/04/2022   Gait abnormality 03/10/2020   Gastroesophageal reflux disease 10/19/2019   Third degree uterine prolapse 10/19/2019   Spinal stenosis of lumbar region without neurogenic claudication 09/15/2019   Urinary and fecal incontinence 09/15/2019   Cystocele with rectocele 09/15/2019   Low back pain with sciatica 07/22/2018   Left lumbar radiculopathy 01/22/2018   Cognitive impairment 10/22/2017   Anxiety 10/22/2017   Aortic atherosclerosis (HCC) 12/04/2016   Vitamin D deficiency 08/19/2014   Osteopenia 08/12/2013   CAD (coronary artery disease), native coronary artery 03/22/2013   Aortic valve stenosis, mild 03/22/2013   Hyperlipemia 03/11/2013   Hypertension 03/11/2013   Allergic rhinitis 03/11/2013   IBS (irritable bowel syndrome) - with chronic recurrent abdominal pain 02/01/2012   Personal history of colonic polyps - adenoma 02/01/2012   PCP: Arville Care MD   REFERRING PROVIDER: Levert Feinstein, MD  REFERRING DIAG: R41.89 (ICD-10-CM) - Cognitive impairment R32 (ICD-10-CM) - Urinary incontinence, unspecified type M54.40 (ICD-10-CM) -  Low back pain with sciatica, sciatica laterality unspecified, unspecified back pain laterality, unspecified chronicity R26.9 (ICD-10-CM) - Gait abnormality  THERAPY DIAG:  Unsteadiness on feet  Muscle weakness (generalized)  Other abnormalities of gait and mobility  History of falling  Rationale for Evaluation and Treatment: Rehabilitation  ONSET DATE: chronic   SUBJECTIVE:   SUBJECTIVE STATEMENT: Reports that her LLE is shaking and "acting up" more today. States that her LLE was sore after evaluation.  PERTINENT HISTORY: Anxiety, hx falls, CAD, carpal tunnel, gait abnormality, HCAP, heart murmur, HLD, HTN, IBS, memory loss, neuromuscular disorder, knee surgery, Parkinsonism  PAIN:  Are you having  pain? No 0/10, sometimes has back pain and L LE numbness   PRECAUTIONS: None  RED FLAGS: None   WEIGHT BEARING RESTRICTIONS: No  FALLS:  Has patient fallen in last 6 months? Yes. Number of falls 1 about a week ago   PLOF: Independent, Independent with household mobility with device, Independent with community mobility with device, and Requires assistive device for independence  PATIENT GOALS: be able to get legs working/be able to walk better   NEXT MD VISIT: October 10th with referring   OBJECTIVE:   PATIENT SURVEYS:  FOTO will take 2nd session   COGNITION: Overall cognitive status: Within functional limits for tasks assessed     SENSATION: L LE numbness all the time "feels like dragging a cinder block"   LOWER EXTREMITY MMT:  MMT Right eval Left eval  Hip flexion 3 3  Hip extension    Hip abduction 3 at best 3 at best   Hip adduction    Hip internal rotation    Hip external rotation    Knee flexion 4 4-  Knee extension 4+ 4  Ankle dorsiflexion 5 5  Ankle plantarflexion    Ankle inversion    Ankle eversion     (Blank rows = not tested)  FUNCTIONAL TESTS:  5 times sit to stand: 27 seconds BUEs on rollator posterior LOB with initial power up to stand  Timed up and go (TUG): 40 seconds with rollator   GAIT: Distance walked: in clinic distances  Assistive device utilized: Environmental consultant - 4 wheeled Level of assistance: Modified independence Comments: L LE with very short step lengths, tends to stick and freeze with poor progression, trunk flexed, limited stance time R LE, tends to push rollator too far ahead, some freezing and difficulty noted on turns   TODAY'S TREATMENT:                                                                                                                              DATE: 07/17/23  EXERCISE LOG  Exercise Repetitions and Resistance Comments  Nustep L2 x10 min   LAQ BLE AROM alternating x30 reps   Seated hip flexion BLE AROM x30 reps    Seated heel/toe raises X30 reps   Seated hip abduction X20 reps    Blank cell = exercise not performed  today   PATIENT EDUCATION:  Education details: exam findings, HEP, POC  Person educated: Patient Education method: Explanation, Demonstration, and Handouts Education comprehension: verbalized understanding, returned demonstration, and needs further education  HOME EXERCISE PROGRAM: Access Code: [email or web address] URL: [email or web address] Date: 07/12/2023 Prepared by: Nedra Hai  Exercises - Seated Hip Abduction with Resistance  - 2 x daily - 7 x weekly - 1 sets - 10 reps - 2 seconds hold - Seated March with Resistance  - 2 x daily - 7 x weekly - 1 sets - 10 reps - 2 seconds hold - Sit to Stand with Armchair  - 2 x daily - 7 x weekly - 1 sets - 10 reps  ASSESSMENT:  CLINICAL IMPRESSION: Patient presented in clinic with reports of shuffling and LLE "acting up" more today than normal. Patient able to tolerate mild therex with little resistance due to muscle fatigue and LLE tingling. Patient reported the LLE tingling especially by the end of therapy session when fatigued. Patient also indicates that she has crepitus of her knees and states that her L knee does not look as similar to R knee.  OBJECTIVE IMPAIRMENTS: Abnormal gait, decreased activity tolerance, decreased balance, decreased coordination, decreased knowledge of use of DME, decreased mobility, difficulty walking, decreased strength, improper body mechanics, postural dysfunction, and pain.   ACTIVITY LIMITATIONS: bending, standing, squatting, stairs, transfers, and locomotion level  PARTICIPATION LIMITATIONS: driving, shopping, and community activity  PERSONAL FACTORS: Age, Behavior pattern, Fitness, Past/current experiences, and Time since onset of injury/illness/exacerbation are also affecting patient's functional outcome.   REHAB POTENTIAL: Fair chronicity of impairments   CLINICAL DECISION MAKING:  Evolving/moderate complexity  EVALUATION COMPLEXITY: Moderate  GOALS: Goals reviewed with patient? Yes  SHORT TERM GOALS: Target date: 08/09/2023   Will be compliant with appropriate progressive HEP  Baseline: Goal status: INITIAL  2.  Step lengths/stance times to be equal B and will be able to ambulate more consistently with upright posture/appropriate distance from rollator  Baseline:  Goal status: INITIAL  3.  Will report no more than 1 fall in the past 4 weeks  Baseline:  Goal status: INITIAL  4.  Will be able to complete TUG test in 20 seconds or less LRAD  Baseline:  Goal status: INITIAL  LONG TERM GOALS: Target date: 09/06/2023   MMT to improve by at least 1 grade all weak groups  Baseline:  Goal status: INITIAL  2.  Will report no falls within the past 4 weeks  Baseline:  Goal status: INITIAL  3.  Will score at least 48 on Berg balance test  Baseline:  Goal status: INITIAL  4.  Will be able to ambulate community distances with fatigue no more than 3/10, appropriate use of LRAD Baseline:  Goal status: INITIAL  5.  Will be compliant with appropriate long term exercise program (land based vs water based vs silver sneakers group class) to maintain functional gains and prevent return of functional impairments  Baseline:  Goal status: INITIAL  6.  Will be able to complete 5xSTS test in 15 seconds or less no posterior LOB  Baseline:  Goal status: INITIAL  PLAN:  PT FREQUENCY: 2x/week  PT DURATION: 8 weeks  PLANNED INTERVENTIONS: Therapeutic exercises, Therapeutic activity, Neuromuscular re-education, Gait training, Self Care, and Manual therapy  PLAN FOR NEXT SESSION: Nustep for reciprocal movement training, gait and balance training, strength, gait training outdoors as well. Try RW instead of 4WW and see if she's a bit safer with it?   Adelina Mings  P Issacc Merlo, PTA 07/17/23 12:03 PM

## 2023-07-19 ENCOUNTER — Ambulatory Visit: Payer: Medicare PPO | Admitting: Physical Therapy

## 2023-07-21 ENCOUNTER — Encounter: Payer: Self-pay | Admitting: Family Medicine

## 2023-07-23 ENCOUNTER — Encounter: Payer: Self-pay | Admitting: Neurology

## 2023-07-24 ENCOUNTER — Ambulatory Visit: Payer: Medicare PPO | Attending: Neurology

## 2023-07-24 DIAGNOSIS — R2681 Unsteadiness on feet: Secondary | ICD-10-CM | POA: Diagnosis not present

## 2023-07-24 DIAGNOSIS — M6281 Muscle weakness (generalized): Secondary | ICD-10-CM | POA: Diagnosis not present

## 2023-07-24 DIAGNOSIS — Z9181 History of falling: Secondary | ICD-10-CM | POA: Diagnosis not present

## 2023-07-24 DIAGNOSIS — R2689 Other abnormalities of gait and mobility: Secondary | ICD-10-CM | POA: Insufficient documentation

## 2023-07-24 NOTE — Therapy (Signed)
OUTPATIENT PHYSICAL THERAPY LOWER EXTREMITY TREATMENT   Patient Name: Shelly Bennett MRN: [id number] DOB:[birth year], 72 y.o., female Today's Date: 07/24/2023  END OF SESSION:  PT End of Session - 07/24/23 1105     Visit Number 3    Number of Visits 17    Date for PT Re-Evaluation 09/06/23    Authorization Type [employer] MCR    Authorization Time Period 07/12/23 to 09/06/23    Progress Note Due on Visit 10    PT Start Time 1100    PT Stop Time 1145    PT Time Calculation (min) 45 min    Activity Tolerance Patient tolerated treatment well    Behavior During Therapy [facility] for tasks assessed/performed            Past Medical History:  Diagnosis Date   Adenomatous colon polyp    Allergic drug reaction 06/25/2015   Allergy    Anxiety    Arthritis    At risk for falls    fallen 4 times recently    CAD (coronary artery disease)    Dr. Excell Bennett follows    Carpal tunnel syndrome    Chronic headaches    Coccydynia 02/01/2012   Cystocele    Diverticulosis    External hemorrhoids    Focal nodular hyperplasia of liver    Gait abnormality    GERD (gastroesophageal reflux disease)    some   HCAP (healthcare-associated pneumonia) 06/25/2015   Heart murmur    HLD (hyperlipidemia)    HTN (hypertension)    IBS (irritable bowel syndrome)    Memory loss    Neuromuscular disorder (HCC)    some neuropathy issues in the past- legs fibromyalgia in past-- past hx of steroid injections   Obesity    Osteoporosis    Otitis of left ear 02/01/2017   Pneumonia    Skin cancer    squamous cell - face   Past Surgical History:  Procedure Laterality Date   CARPAL TUNNEL RELEASE     COLONOSCOPY  12/15/2008   diverticulosis, external hemorrhoids   KNEE SURGERY  05/22/2015   right   LAPAROSCOPY     SKIN SURGERY     squamous cell - right face    Patient Active Problem List   Diagnosis Date Noted   Dementia (HCC) 05/02/2023   Frequent falls 03/12/2023   Parkinsonism 01/24/2023   Urinary  incontinence 01/24/2023   Pain in right knee 12/05/2022   Pain in left knee 10/23/2022   Bilateral primary osteoarthritis of knee 10/04/2022   Gait abnormality 03/10/2020   Gastroesophageal reflux disease 10/19/2019   Third degree uterine prolapse 10/19/2019   Spinal stenosis of lumbar region without neurogenic claudication 09/15/2019   Urinary and fecal incontinence 09/15/2019   Cystocele with rectocele 09/15/2019   Low back pain with sciatica 07/22/2018   Left lumbar radiculopathy 01/22/2018   Cognitive impairment 10/22/2017   Anxiety 10/22/2017   Aortic atherosclerosis (HCC) 12/04/2016   Vitamin D deficiency 08/19/2014   Osteopenia 08/12/2013   CAD (coronary artery disease), native coronary artery 03/22/2013   Aortic valve stenosis, mild 03/22/2013   Hyperlipemia 03/11/2013   Hypertension 03/11/2013   Allergic rhinitis 03/11/2013   IBS (irritable bowel syndrome) - with chronic recurrent abdominal pain 02/01/2012   Personal history of colonic polyps - adenoma 02/01/2012   PCP: Shelly Care MD   REFERRING PROVIDER: Levert Feinstein, MD  REFERRING DIAG: R41.89 (ICD-10-CM) - Cognitive impairment R32 (ICD-10-CM) - Urinary incontinence, unspecified type M54.40 (ICD-10-CM) -  Low back pain with sciatica, sciatica laterality unspecified, unspecified back pain laterality, unspecified chronicity R26.9 (ICD-10-CM) - Gait abnormality  THERAPY DIAG:  Unsteadiness on feet  Muscle weakness (generalized)  Other abnormalities of gait and mobility  History of falling  Rationale for Evaluation and Treatment: Rehabilitation  ONSET DATE: chronic   SUBJECTIVE:   SUBJECTIVE STATEMENT: Pt arrives for today's treatment session reporting 5/10 left knee pain.  Pt reports she has an appt at [facility] tomorrow.    PERTINENT HISTORY: Anxiety, hx falls, CAD, carpal tunnel, gait abnormality, HCAP, heart murmur, HLD, HTN, IBS, memory loss, neuromuscular disorder, knee surgery, Parkinsonism  PAIN:  Are  you having pain? Yes: NPRS scale: 5/10 Pain location: left knee    PRECAUTIONS: None  RED FLAGS: None   WEIGHT BEARING RESTRICTIONS: No  FALLS:  Has patient fallen in last 6 months? Yes. Number of falls 1 about a week ago   PLOF: Independent, Independent with household mobility with device, Independent with community mobility with device, and Requires assistive device for independence  PATIENT GOALS: be able to get legs working/be able to walk better   NEXT MD VISIT: October 10th with referring   OBJECTIVE:   PATIENT SURVEYS:  FOTO will take 2nd session   COGNITION: Overall cognitive status: Within functional limits for tasks assessed     SENSATION: L LE numbness all the time "feels like dragging a cinder block"   LOWER EXTREMITY MMT:  MMT Right eval Left eval  Hip flexion 3 3  Hip extension    Hip abduction 3 at best 3 at best   Hip adduction    Hip internal rotation    Hip external rotation    Knee flexion 4 4-  Knee extension 4+ 4  Ankle dorsiflexion 5 5  Ankle plantarflexion    Ankle inversion    Ankle eversion     (Blank rows = not tested)  FUNCTIONAL TESTS:  5 times sit to stand: 27 seconds BUEs on rollator posterior LOB with initial power up to stand  Timed up and go (TUG): 40 seconds with rollator   GAIT: Distance walked: in clinic distances  Assistive device utilized: Environmental consultant - 4 wheeled Level of assistance: Modified independence Comments: L LE with very short step lengths, tends to stick and freeze with poor progression, trunk flexed, limited stance time R LE, tends to push rollator too far ahead, some freezing and difficulty noted on turns   TODAY'S TREATMENT:                                                                                                                              DATE: 07/24/23  EXERCISE LOG  Exercise Repetitions and Resistance Comments  Nustep L2 x17 min   LAQ BLE AROM alternating x30 reps   Seated hip flexion BLE AROM x30  reps   Seated heel/toe raises X30 reps   Seated hip abduction X30 reps   Seated hip adduction X3  mins    Blank cell = exercise not performed today   PATIENT EDUCATION:  Education details: exam findings, HEP, POC  Person educated: Patient Education method: Explanation, Demonstration, and Handouts Education comprehension: verbalized understanding, returned demonstration, and needs further education  HOME EXERCISE PROGRAM: Access Code: [email or web address] URL: [email or web address] Date: 07/12/2023 Prepared by: Nedra Hai  Exercises - Seated Hip Abduction with Resistance  - 2 x daily - 7 x weekly - 1 sets - 10 reps - 2 seconds hold - Seated March with Resistance  - 2 x daily - 7 x weekly - 1 sets - 10 reps - 2 seconds hold - Sit to Stand with Armchair  - 2 x daily - 7 x weekly - 1 sets - 10 reps  ASSESSMENT:  CLINICAL IMPRESSION: Pt arrives for today's treatment session reporting 5/10 left knee pain. Pt reports that she has an appointment with [facility] tomorrow.  Pt able to tolerate increased reps with several exercises today with noted fatigue. Pt given seated rest breaks as needed.  Discussed use of moist heat and/or ice at home for left knee pain relief.  Pt denied any change in pain at completion of today's treatment session.  OBJECTIVE IMPAIRMENTS: Abnormal gait, decreased activity tolerance, decreased balance, decreased coordination, decreased knowledge of use of DME, decreased mobility, difficulty walking, decreased strength, improper body mechanics, postural dysfunction, and pain.   ACTIVITY LIMITATIONS: bending, standing, squatting, stairs, transfers, and locomotion level  PARTICIPATION LIMITATIONS: driving, shopping, and community activity  PERSONAL FACTORS: Age, Behavior pattern, Fitness, Past/current experiences, and Time since onset of injury/illness/exacerbation are also affecting patient's functional outcome.   REHAB POTENTIAL: Fair chronicity of impairments    CLINICAL DECISION MAKING: Evolving/moderate complexity  EVALUATION COMPLEXITY: Moderate  GOALS: Goals reviewed with patient? Yes  SHORT TERM GOALS: Target date: 08/09/2023   Will be compliant with appropriate progressive HEP  Baseline: Goal status: INITIAL  2.  Step lengths/stance times to be equal B and will be able to ambulate more consistently with upright posture/appropriate distance from rollator  Baseline:  Goal status: INITIAL  3.  Will report no more than 1 fall in the past 4 weeks  Baseline:  Goal status: INITIAL  4.  Will be able to complete TUG test in 20 seconds or less LRAD  Baseline:  Goal status: INITIAL  LONG TERM GOALS: Target date: 09/06/2023   MMT to improve by at least 1 grade all weak groups  Baseline:  Goal status: INITIAL  2.  Will report no falls within the past 4 weeks  Baseline:  Goal status: INITIAL  3.  Will score at least 48 on Berg balance test  Baseline:  Goal status: INITIAL  4.  Will be able to ambulate community distances with fatigue no more than 3/10, appropriate use of LRAD Baseline:  Goal status: INITIAL  5.  Will be compliant with appropriate long term exercise program (land based vs water based vs silver sneakers group class) to maintain functional gains and prevent return of functional impairments  Baseline:  Goal status: INITIAL  6.  Will be able to complete 5xSTS test in 15 seconds or less no posterior LOB  Baseline:  Goal status: INITIAL  PLAN:  PT FREQUENCY: 2x/week  PT DURATION: 8 weeks  PLANNED INTERVENTIONS: Therapeutic exercises, Therapeutic activity, Neuromuscular re-education, Gait training, Self Bennett, and Manual therapy  PLAN FOR NEXT SESSION: Nustep for reciprocal movement training, gait and balance training, strength, gait training outdoors as well. Try RW instead of [id number] and see  if she's a bit safer with it?   Marvell Fuller, PTA 07/24/23 12:09 PM

## 2023-07-25 DIAGNOSIS — G319 Degenerative disease of nervous system, unspecified: Secondary | ICD-10-CM | POA: Diagnosis not present

## 2023-07-26 ENCOUNTER — Ambulatory Visit: Payer: Medicare PPO

## 2023-07-26 DIAGNOSIS — R2681 Unsteadiness on feet: Secondary | ICD-10-CM | POA: Diagnosis not present

## 2023-07-26 DIAGNOSIS — R2689 Other abnormalities of gait and mobility: Secondary | ICD-10-CM | POA: Diagnosis not present

## 2023-07-26 DIAGNOSIS — M6281 Muscle weakness (generalized): Secondary | ICD-10-CM

## 2023-07-26 DIAGNOSIS — Z9181 History of falling: Secondary | ICD-10-CM | POA: Diagnosis not present

## 2023-07-26 NOTE — Therapy (Signed)
OUTPATIENT PHYSICAL THERAPY LOWER EXTREMITY TREATMENT   Patient Name: Shelly Bennett MRN: [id number] DOB:Sep 04, 1951, 72 y.o., female Today's Date: 07/26/2023  END OF SESSION:  PT End of Session - 07/26/23 1106     Visit Number 4    Number of Visits 17    Date for PT Re-Evaluation 09/06/23    Authorization Type [employer] MCR    Authorization Time Period 07/12/23 to 09/06/23    Progress Note Due on Visit 10    PT Start Time 1100    PT Stop Time 1145    PT Time Calculation (min) 45 min    Activity Tolerance Patient tolerated treatment well    Behavior During Therapy [facility] for tasks assessed/performed            Past Medical History:  Diagnosis Date   Adenomatous colon polyp    Allergic drug reaction 06/25/2015   Allergy    Anxiety    Arthritis    At risk for falls    fallen 4 times recently    CAD (coronary artery disease)    Dr. Excell Seltzer follows    Carpal tunnel syndrome    Chronic headaches    Coccydynia 02/01/2012   Cystocele    Diverticulosis    External hemorrhoids    Focal nodular hyperplasia of liver    Gait abnormality    GERD (gastroesophageal reflux disease)    some   HCAP (healthcare-associated pneumonia) 06/25/2015   Heart murmur    HLD (hyperlipidemia)    HTN (hypertension)    IBS (irritable bowel syndrome)    Memory loss    Neuromuscular disorder (HCC)    some neuropathy issues in the past- legs fibromyalgia in past-- past hx of steroid injections   Obesity    Osteoporosis    Otitis of left ear 02/01/2017   Pneumonia    Skin cancer    squamous cell - face   Past Surgical History:  Procedure Laterality Date   CARPAL TUNNEL RELEASE     COLONOSCOPY  12/15/2008   diverticulosis, external hemorrhoids   KNEE SURGERY  05/22/2015   right   LAPAROSCOPY     SKIN SURGERY     squamous cell - right face    Patient Active Problem List   Diagnosis Date Noted   Dementia (HCC) 05/02/2023   Frequent falls 03/12/2023   Parkinsonism 01/24/2023   Urinary  incontinence 01/24/2023   Pain in right knee 12/05/2022   Pain in left knee 10/23/2022   Bilateral primary osteoarthritis of knee 10/04/2022   Gait abnormality 03/10/2020   Gastroesophageal reflux disease 10/19/2019   Third degree uterine prolapse 10/19/2019   Spinal stenosis of lumbar region without neurogenic claudication 09/15/2019   Urinary and fecal incontinence 09/15/2019   Cystocele with rectocele 09/15/2019   Low back pain with sciatica 07/22/2018   Left lumbar radiculopathy 01/22/2018   Cognitive impairment 10/22/2017   Anxiety 10/22/2017   Aortic atherosclerosis (HCC) 12/04/2016   Vitamin D deficiency 08/19/2014   Osteopenia 08/12/2013   CAD (coronary artery disease), native coronary artery 03/22/2013   Aortic valve stenosis, mild 03/22/2013   Hyperlipemia 03/11/2013   Hypertension 03/11/2013   Allergic rhinitis 03/11/2013   IBS (irritable bowel syndrome) - with chronic recurrent abdominal pain 02/01/2012   Personal history of colonic polyps - adenoma 02/01/2012   PCP: Arville Care MD   REFERRING PROVIDER: Levert Feinstein, MD  REFERRING DIAG: R41.89 (ICD-10-CM) - Cognitive impairment R32 (ICD-10-CM) - Urinary incontinence, unspecified type M54.40 (ICD-10-CM) -  Low back pain with sciatica, sciatica laterality unspecified, unspecified back pain laterality, unspecified chronicity R26.9 (ICD-10-CM) - Gait abnormality  THERAPY DIAG:  Unsteadiness on feet  Muscle weakness (generalized)  Other abnormalities of gait and mobility  History of falling  Rationale for Evaluation and Treatment: Rehabilitation  ONSET DATE: chronic   SUBJECTIVE:   SUBJECTIVE STATEMENT: Pt reports minimal left knee pain due to using valerian gel prior to coming to therapy.  Pt reports appt yesterday at [facility] went well.    PERTINENT HISTORY: Anxiety, hx falls, CAD, carpal tunnel, gait abnormality, HCAP, heart murmur, HLD, HTN, IBS, memory loss, neuromuscular disorder, knee surgery,  Parkinsonism  PAIN:  Are you having pain? Yes: NPRS scale: 2/10 Pain location: left knee    PRECAUTIONS: None  RED FLAGS: None   WEIGHT BEARING RESTRICTIONS: No  FALLS:  Has patient fallen in last 6 months? Yes. Number of falls 1 about a week ago   PLOF: Independent, Independent with household mobility with device, Independent with community mobility with device, and Requires assistive device for independence  PATIENT GOALS: be able to get legs working/be able to walk better   NEXT MD VISIT: October 10th with referring   OBJECTIVE:   PATIENT SURVEYS:  FOTO will take 2nd session   COGNITION: Overall cognitive status: Within functional limits for tasks assessed     SENSATION: L LE numbness all the time "feels like dragging a cinder block"   LOWER EXTREMITY MMT:  MMT Right eval Left eval  Hip flexion 3 3  Hip extension    Hip abduction 3 at best 3 at best   Hip adduction    Hip internal rotation    Hip external rotation    Knee flexion 4 4-  Knee extension 4+ 4  Ankle dorsiflexion 5 5  Ankle plantarflexion    Ankle inversion    Ankle eversion     (Blank rows = not tested)  FUNCTIONAL TESTS:  5 times sit to stand: 27 seconds BUEs on rollator posterior LOB with initial power up to stand  Timed up and go (TUG): 40 seconds with rollator   GAIT: Distance walked: in clinic distances  Assistive device utilized: Environmental consultant - 4 wheeled Level of assistance: Modified independence Comments: L LE with very short step lengths, tends to stick and freeze with poor progression, trunk flexed, limited stance time R LE, tends to push rollator too far ahead, some freezing and difficulty noted on turns   TODAY'S TREATMENT:                                                                                                                              DATE: 07/26/23  EXERCISE LOG  Exercise Repetitions and Resistance Comments  Nustep L3 x17 min   LAQ 2# 2 x 10 reps bil   Seated hip  flexion 2# 2 x 10 reps bil   Seated heel/toe raises X30 reps   Seated hip abduction Yellow  tband x 30 reps   Seated hip adduction X3 mins   Seated ham curls Yellow x 20 reps bil    Blank cell = exercise not performed today   PATIENT EDUCATION:  Education details: exam findings, HEP, POC  Person educated: Patient Education method: Explanation, Demonstration, and Handouts Education comprehension: verbalized understanding, returned demonstration, and needs further education  HOME EXERCISE PROGRAM: Access Code: [email or web address] URL: [email or web address] Date: 07/12/2023 Prepared by: Nedra Hai  Exercises - Seated Hip Abduction with Resistance  - 2 x daily - 7 x weekly - 1 sets - 10 reps - 2 seconds hold - Seated March with Resistance  - 2 x daily - 7 x weekly - 1 sets - 10 reps - 2 seconds hold - Sit to Stand with Armchair  - 2 x daily - 7 x weekly - 1 sets - 10 reps  ASSESSMENT:  CLINICAL IMPRESSION: Pt arrives for today's treatment session reporting minimal left knee pain.  Pt able to tolerate the addition of resistance to several seated exercises today.  Pt with increased left knee pain after completion of today's treatment.  Pt encouraged to contact MD about another injection in left knee.  Pt reports fatigue at completion of treatment session and minimal increase in left knee pain. OBJECTIVE IMPAIRMENTS: Abnormal gait, decreased activity tolerance, decreased balance, decreased coordination, decreased knowledge of use of DME, decreased mobility, difficulty walking, decreased strength, improper body mechanics, postural dysfunction, and pain.   ACTIVITY LIMITATIONS: bending, standing, squatting, stairs, transfers, and locomotion level  PARTICIPATION LIMITATIONS: driving, shopping, and community activity  PERSONAL FACTORS: Age, Behavior pattern, Fitness, Past/current experiences, and Time since onset of injury/illness/exacerbation are also affecting patient's functional  outcome.   REHAB POTENTIAL: Fair chronicity of impairments   CLINICAL DECISION MAKING: Evolving/moderate complexity  EVALUATION COMPLEXITY: Moderate  GOALS: Goals reviewed with patient? Yes  SHORT TERM GOALS: Target date: 08/09/2023   Will be compliant with appropriate progressive HEP  Baseline: Goal status: INITIAL  2.  Step lengths/stance times to be equal B and will be able to ambulate more consistently with upright posture/appropriate distance from rollator  Baseline:  Goal status: INITIAL  3.  Will report no more than 1 fall in the past 4 weeks  Baseline:  Goal status: INITIAL  4.  Will be able to complete TUG test in 20 seconds or less LRAD  Baseline:  Goal status: INITIAL  LONG TERM GOALS: Target date: 09/06/2023   MMT to improve by at least 1 grade all weak groups  Baseline:  Goal status: INITIAL  2.  Will report no falls within the past 4 weeks  Baseline:  Goal status: INITIAL  3.  Will score at least 48 on Berg balance test  Baseline:  Goal status: INITIAL  4.  Will be able to ambulate community distances with fatigue no more than 3/10, appropriate use of LRAD Baseline:  Goal status: INITIAL  5.  Will be compliant with appropriate long term exercise program (land based vs water based vs silver sneakers group class) to maintain functional gains and prevent return of functional impairments  Baseline:  Goal status: INITIAL  6.  Will be able to complete 5xSTS test in 15 seconds or less no posterior LOB  Baseline:  Goal status: INITIAL  PLAN:  PT FREQUENCY: 2x/week  PT DURATION: 8 weeks  PLANNED INTERVENTIONS: Therapeutic exercises, Therapeutic activity, Neuromuscular re-education, Gait training, Self Care, and Manual therapy  PLAN FOR NEXT SESSION: Nustep for reciprocal movement training, gait and balance  training, strength, gait training outdoors as well. Try RW instead of 4WW and see if she's a bit safer with it?   Marvell Fuller, PTA 07/26/23  11:58 AM

## 2023-07-30 ENCOUNTER — Ambulatory Visit: Payer: Medicare PPO

## 2023-07-30 DIAGNOSIS — R2681 Unsteadiness on feet: Secondary | ICD-10-CM

## 2023-07-30 DIAGNOSIS — Z9181 History of falling: Secondary | ICD-10-CM | POA: Diagnosis not present

## 2023-07-30 DIAGNOSIS — M6281 Muscle weakness (generalized): Secondary | ICD-10-CM

## 2023-07-30 DIAGNOSIS — R2689 Other abnormalities of gait and mobility: Secondary | ICD-10-CM | POA: Diagnosis not present

## 2023-07-30 NOTE — Therapy (Signed)
OUTPATIENT PHYSICAL THERAPY LOWER EXTREMITY TREATMENT   Patient Name: Shelly Bennett MRN: [id number] DOB:Jul 20, 1951, 72 y.o., female Today's Date: 07/30/2023  END OF SESSION:  PT End of Session - 07/30/23 1115     Visit Number 5    Number of Visits 17    Date for PT Re-Evaluation 09/06/23    Authorization Type [employer] MCR    Authorization Time Period 07/12/23 to 09/06/23    Progress Note Due on Visit 10    PT Start Time 1100    Activity Tolerance Patient tolerated treatment well    Behavior During Therapy [facility] for tasks assessed/performed            Past Medical History:  Diagnosis Date   Adenomatous colon polyp    Allergic drug reaction 06/25/2015   Allergy    Anxiety    Arthritis    At risk for falls    fallen 4 times recently    CAD (coronary artery disease)    Dr. Excell Seltzer follows    Carpal tunnel syndrome    Chronic headaches    Coccydynia 02/01/2012   Cystocele    Diverticulosis    External hemorrhoids    Focal nodular hyperplasia of liver    Gait abnormality    GERD (gastroesophageal reflux disease)    some   HCAP (healthcare-associated pneumonia) 06/25/2015   Heart murmur    HLD (hyperlipidemia)    HTN (hypertension)    IBS (irritable bowel syndrome)    Memory loss    Neuromuscular disorder (HCC)    some neuropathy issues in the past- legs fibromyalgia in past-- past hx of steroid injections   Obesity    Osteoporosis    Otitis of left ear 02/01/2017   Pneumonia    Skin cancer    squamous cell - face   Past Surgical History:  Procedure Laterality Date   CARPAL TUNNEL RELEASE     COLONOSCOPY  12/15/2008   diverticulosis, external hemorrhoids   KNEE SURGERY  05/22/2015   right   LAPAROSCOPY     SKIN SURGERY     squamous cell - right face    Patient Active Problem List   Diagnosis Date Noted   Dementia (HCC) 05/02/2023   Frequent falls 03/12/2023   Parkinsonism 01/24/2023   Urinary incontinence 01/24/2023   Pain in right knee 12/05/2022    Pain in left knee 10/23/2022   Bilateral primary osteoarthritis of knee 10/04/2022   Gait abnormality 03/10/2020   Gastroesophageal reflux disease 10/19/2019   Third degree uterine prolapse 10/19/2019   Spinal stenosis of lumbar region without neurogenic claudication 09/15/2019   Urinary and fecal incontinence 09/15/2019   Cystocele with rectocele 09/15/2019   Low back pain with sciatica 07/22/2018   Left lumbar radiculopathy 01/22/2018   Cognitive impairment 10/22/2017   Anxiety 10/22/2017   Aortic atherosclerosis (HCC) 12/04/2016   Vitamin D deficiency 08/19/2014   Osteopenia 08/12/2013   CAD (coronary artery disease), native coronary artery 03/22/2013   Aortic valve stenosis, mild 03/22/2013   Hyperlipemia 03/11/2013   Hypertension 03/11/2013   Allergic rhinitis 03/11/2013   IBS (irritable bowel syndrome) - with chronic recurrent abdominal pain 02/01/2012   Personal history of colonic polyps - adenoma 02/01/2012   PCP: Arville Care MD   REFERRING PROVIDER: Levert Feinstein, MD  REFERRING DIAG: R41.89 (ICD-10-CM) - Cognitive impairment R32 (ICD-10-CM) - Urinary incontinence, unspecified type M54.40 (ICD-10-CM) - Low back pain with sciatica, sciatica laterality unspecified, unspecified back pain laterality, unspecified chronicity R26.9 (ICD-10-CM) -  Gait abnormality  THERAPY DIAG:  Unsteadiness on feet  Muscle weakness (generalized)  Other abnormalities of gait and mobility  History of falling  Rationale for Evaluation and Treatment: Rehabilitation  ONSET DATE: chronic   SUBJECTIVE:   SUBJECTIVE STATEMENT: Pt reports 4/10 left knee pain today.  Pt reports rainy weather has caused increase pain.  PERTINENT HISTORY: Anxiety, hx falls, CAD, carpal tunnel, gait abnormality, HCAP, heart murmur, HLD, HTN, IBS, memory loss, neuromuscular disorder, knee surgery, Parkinsonism  PAIN:  Are you having pain? Yes: NPRS scale: 4/10 Pain location: left knee    PRECAUTIONS:  None  RED FLAGS: None   WEIGHT BEARING RESTRICTIONS: No  FALLS:  Has patient fallen in last 6 months? Yes. Number of falls 1 about a week ago   PLOF: Independent, Independent with household mobility with device, Independent with community mobility with device, and Requires assistive device for independence  PATIENT GOALS: be able to get legs working/be able to walk better   NEXT MD VISIT: October 10th with referring   OBJECTIVE:   PATIENT SURVEYS:  FOTO will take 2nd session   COGNITION: Overall cognitive status: Within functional limits for tasks assessed     SENSATION: L LE numbness all the time "feels like dragging a cinder block"   LOWER EXTREMITY MMT:  MMT Right eval Left eval  Hip flexion 3 3  Hip extension    Hip abduction 3 at best 3 at best   Hip adduction    Hip internal rotation    Hip external rotation    Knee flexion 4 4-  Knee extension 4+ 4  Ankle dorsiflexion 5 5  Ankle plantarflexion    Ankle inversion    Ankle eversion     (Blank rows = not tested)  FUNCTIONAL TESTS:  5 times sit to stand: 27 seconds BUEs on rollator posterior LOB with initial power up to stand  Timed up and go (TUG): 40 seconds with rollator   GAIT: Distance walked: in clinic distances  Assistive device utilized: Environmental consultant - 4 wheeled Level of assistance: Modified independence Comments: L LE with very short step lengths, tends to stick and freeze with poor progression, trunk flexed, limited stance time R LE, tends to push rollator too far ahead, some freezing and difficulty noted on turns   TODAY'S TREATMENT:                                                                                                                              DATE: 07/30/23  EXERCISE LOG  Exercise Repetitions and Resistance Comments  Nustep L1-2 x17 min   LAQ 2# 2 x 10 reps bil   Seated hip flexion 2# 2 x 10 reps bil   Seated heel/toe raises X30 reps   Seated hip abduction Yellow tband x 30 reps    Seated Toe Taps 4" step x 25 reps bil    Seated hip adduction X3 mins   Seated ham curls  Yellow x 20 reps bil    Blank cell = exercise not performed today   PATIENT EDUCATION:  Education details: exam findings, HEP, POC  Person educated: Patient Education method: Explanation, Demonstration, and Handouts Education comprehension: verbalized understanding, returned demonstration, and needs further education  HOME EXERCISE PROGRAM: Access Code: [email or web address] URL: [email or web address] Date: 07/12/2023 Prepared by: Nedra Hai  Exercises - Seated Hip Abduction with Resistance  - 2 x daily - 7 x weekly - 1 sets - 10 reps - 2 seconds hold - Seated March with Resistance  - 2 x daily - 7 x weekly - 1 sets - 10 reps - 2 seconds hold - Sit to Stand with Armchair  - 2 x daily - 7 x weekly - 1 sets - 10 reps  ASSESSMENT:  CLINICAL IMPRESSION: Pt arrives for today's treatment session reporting 4/10 left knee pain. Pt reports bil knee "popping" today with ambulation and performance on Nustep.  Pt denies any pain with "popping." Pt introduced to seated toe taps on 4" step today with notable fatigue.  Pt denies any increase in pain at completion of today's treatment session.   OBJECTIVE IMPAIRMENTS: Abnormal gait, decreased activity tolerance, decreased balance, decreased coordination, decreased knowledge of use of DME, decreased mobility, difficulty walking, decreased strength, improper body mechanics, postural dysfunction, and pain.   ACTIVITY LIMITATIONS: bending, standing, squatting, stairs, transfers, and locomotion level  PARTICIPATION LIMITATIONS: driving, shopping, and community activity  PERSONAL FACTORS: Age, Behavior pattern, Fitness, Past/current experiences, and Time since onset of injury/illness/exacerbation are also affecting patient's functional outcome.   REHAB POTENTIAL: Fair chronicity of impairments   CLINICAL DECISION MAKING: Evolving/moderate  complexity  EVALUATION COMPLEXITY: Moderate  GOALS: Goals reviewed with patient? Yes  SHORT TERM GOALS: Target date: 08/09/2023   Will be compliant with appropriate progressive HEP  Baseline: Goal status: INITIAL  2.  Step lengths/stance times to be equal B and will be able to ambulate more consistently with upright posture/appropriate distance from rollator  Baseline:  Goal status: INITIAL  3.  Will report no more than 1 fall in the past 4 weeks  Baseline:  Goal status: INITIAL  4.  Will be able to complete TUG test in 20 seconds or less LRAD  Baseline:  Goal status: INITIAL  LONG TERM GOALS: Target date: 09/06/2023   MMT to improve by at least 1 grade all weak groups  Baseline:  Goal status: INITIAL  2.  Will report no falls within the past 4 weeks  Baseline:  Goal status: INITIAL  3.  Will score at least 48 on Berg balance test  Baseline:  Goal status: INITIAL  4.  Will be able to ambulate community distances with fatigue no more than 3/10, appropriate use of LRAD Baseline:  Goal status: INITIAL  5.  Will be compliant with appropriate long term exercise program (land based vs water based vs silver sneakers group class) to maintain functional gains and prevent return of functional impairments  Baseline:  Goal status: INITIAL  6.  Will be able to complete 5xSTS test in 15 seconds or less no posterior LOB  Baseline:  Goal status: INITIAL  PLAN:  PT FREQUENCY: 2x/week  PT DURATION: 8 weeks  PLANNED INTERVENTIONS: Therapeutic exercises, Therapeutic activity, Neuromuscular re-education, Gait training, Self Care, and Manual therapy  PLAN FOR NEXT SESSION: Nustep for reciprocal movement training, gait and balance training, strength, gait training outdoors as well. Try RW instead of 4WW and see if she's a bit safer with it?  Rexene Agent, PTA 07/30/23 11:51 AM

## 2023-07-31 ENCOUNTER — Encounter: Payer: Medicare PPO | Admitting: Neurology

## 2023-08-02 ENCOUNTER — Ambulatory Visit: Payer: Medicare PPO

## 2023-08-02 DIAGNOSIS — Z9181 History of falling: Secondary | ICD-10-CM

## 2023-08-02 DIAGNOSIS — R2681 Unsteadiness on feet: Secondary | ICD-10-CM

## 2023-08-02 DIAGNOSIS — M6281 Muscle weakness (generalized): Secondary | ICD-10-CM

## 2023-08-02 DIAGNOSIS — R2689 Other abnormalities of gait and mobility: Secondary | ICD-10-CM

## 2023-08-02 NOTE — Therapy (Signed)
OUTPATIENT PHYSICAL THERAPY LOWER EXTREMITY TREATMENT   Patient Name: Shelly Bennett MRN: [id number] DOB:1951-05-24, 72 y.o., female Today's Date: 08/02/2023  END OF SESSION:  PT End of Session - 08/02/23 1105     Visit Number 6    Number of Visits 17    Date for PT Re-Evaluation 09/06/23    Authorization Type [employer] MCR    Authorization Time Period 07/12/23 to 09/06/23    Progress Note Due on Visit 10    PT Start Time 1100    PT Stop Time 1145    PT Time Calculation (min) 45 min    Activity Tolerance Patient tolerated treatment well    Behavior During Therapy [facility] for tasks assessed/performed            Past Medical History:  Diagnosis Date   Adenomatous colon polyp    Allergic drug reaction 06/25/2015   Allergy    Anxiety    Arthritis    At risk for falls    fallen 4 times recently    CAD (coronary artery disease)    Dr. Excell Seltzer follows    Carpal tunnel syndrome    Chronic headaches    Coccydynia 02/01/2012   Cystocele    Diverticulosis    External hemorrhoids    Focal nodular hyperplasia of liver    Gait abnormality    GERD (gastroesophageal reflux disease)    some   HCAP (healthcare-associated pneumonia) 06/25/2015   Heart murmur    HLD (hyperlipidemia)    HTN (hypertension)    IBS (irritable bowel syndrome)    Memory loss    Neuromuscular disorder (HCC)    some neuropathy issues in the past- legs fibromyalgia in past-- past hx of steroid injections   Obesity    Osteoporosis    Otitis of left ear 02/01/2017   Pneumonia    Skin cancer    squamous cell - face   Past Surgical History:  Procedure Laterality Date   CARPAL TUNNEL RELEASE     COLONOSCOPY  12/15/2008   diverticulosis, external hemorrhoids   KNEE SURGERY  05/22/2015   right   LAPAROSCOPY     SKIN SURGERY     squamous cell - right face    Patient Active Problem List   Diagnosis Date Noted   Dementia (HCC) 05/02/2023   Frequent falls 03/12/2023   Parkinsonism 01/24/2023   Urinary  incontinence 01/24/2023   Pain in right knee 12/05/2022   Pain in left knee 10/23/2022   Bilateral primary osteoarthritis of knee 10/04/2022   Gait abnormality 03/10/2020   Gastroesophageal reflux disease 10/19/2019   Third degree uterine prolapse 10/19/2019   Spinal stenosis of lumbar region without neurogenic claudication 09/15/2019   Urinary and fecal incontinence 09/15/2019   Cystocele with rectocele 09/15/2019   Low back pain with sciatica 07/22/2018   Left lumbar radiculopathy 01/22/2018   Cognitive impairment 10/22/2017   Anxiety 10/22/2017   Aortic atherosclerosis (HCC) 12/04/2016   Vitamin D deficiency 08/19/2014   Osteopenia 08/12/2013   CAD (coronary artery disease), native coronary artery 03/22/2013   Aortic valve stenosis, mild 03/22/2013   Hyperlipemia 03/11/2013   Hypertension 03/11/2013   Allergic rhinitis 03/11/2013   IBS (irritable bowel syndrome) - with chronic recurrent abdominal pain 02/01/2012   Personal history of colonic polyps - adenoma 02/01/2012   PCP: Arville Care MD   REFERRING PROVIDER: Levert Feinstein, MD  REFERRING DIAG: R41.89 (ICD-10-CM) - Cognitive impairment R32 (ICD-10-CM) - Urinary incontinence, unspecified type M54.40 (ICD-10-CM) -  Low back pain with sciatica, sciatica laterality unspecified, unspecified back pain laterality, unspecified chronicity R26.9 (ICD-10-CM) - Gait abnormality  THERAPY DIAG:  Unsteadiness on feet  Muscle weakness (generalized)  Other abnormalities of gait and mobility  History of falling  Rationale for Evaluation and Treatment: Rehabilitation  ONSET DATE: chronic   SUBJECTIVE:   SUBJECTIVE STATEMENT: Pt reports 4/10 left knee pain today.    PERTINENT HISTORY: Anxiety, hx falls, CAD, carpal tunnel, gait abnormality, HCAP, heart murmur, HLD, HTN, IBS, memory loss, neuromuscular disorder, knee surgery, Parkinsonism  PAIN:  Are you having pain? Yes: NPRS scale: 4/10 Pain location: left knee     PRECAUTIONS: None  RED FLAGS: None   WEIGHT BEARING RESTRICTIONS: No  FALLS:  Has patient fallen in last 6 months? Yes. Number of falls 1 about a week ago   PLOF: Independent, Independent with household mobility with device, Independent with community mobility with device, and Requires assistive device for independence  PATIENT GOALS: be able to get legs working/be able to walk better   NEXT MD VISIT: October 10th with referring   OBJECTIVE:   PATIENT SURVEYS:  FOTO will take 2nd session   COGNITION: Overall cognitive status: Within functional limits for tasks assessed     SENSATION: L LE numbness all the time "feels like dragging a cinder block"   LOWER EXTREMITY MMT:  MMT Right eval Left eval  Hip flexion 3 3  Hip extension    Hip abduction 3 at best 3 at best   Hip adduction    Hip internal rotation    Hip external rotation    Knee flexion 4 4-  Knee extension 4+ 4  Ankle dorsiflexion 5 5  Ankle plantarflexion    Ankle inversion    Ankle eversion     (Blank rows = not tested)  FUNCTIONAL TESTS:  5 times sit to stand: 27 seconds BUEs on rollator posterior LOB with initial power up to stand  Timed up and go (TUG): 40 seconds with rollator   GAIT: Distance walked: in clinic distances  Assistive device utilized: Environmental consultant - 4 wheeled Level of assistance: Modified independence Comments: L LE with very short step lengths, tends to stick and freeze with poor progression, trunk flexed, limited stance time R LE, tends to push rollator too far ahead, some freezing and difficulty noted on turns   TODAY'S TREATMENT:                                                                                                                              DATE: 08/02/23  EXERCISE LOG  Exercise Repetitions and Resistance Comments  Nustep L1-2 x17 min   LAQ 2# x25  reps bil   Seated hip flexion 2# x25 reps bil   Seated heel/toe raises X30 reps   Seated hip abduction Yellow tband x  2 mins   Seated Toe Taps 4" step x 25 reps bil    Seated hip adduction  X3 mins   Seated ham curls Yellow x 25 reps bil    Blank cell = exercise not performed today   PATIENT EDUCATION:  Education details: exam findings, HEP, POC  Person educated: Patient Education method: Explanation, Demonstration, and Handouts Education comprehension: verbalized understanding, returned demonstration, and needs further education  HOME EXERCISE PROGRAM: Access Code: [email or web address] URL: [email or web address] Date: 07/12/2023 Prepared by: Nedra Hai  Exercises - Seated Hip Abduction with Resistance  - 2 x daily - 7 x weekly - 1 sets - 10 reps - 2 seconds hold - Seated March with Resistance  - 2 x daily - 7 x weekly - 1 sets - 10 reps - 2 seconds hold - Sit to Stand with Armchair  - 2 x daily - 7 x weekly - 1 sets - 10 reps  ASSESSMENT:  CLINICAL IMPRESSION: Pt arrives for today's treatment session reporting mild to moderate left knee pain.  Pt reports that she forgot to put Voltaren cream on her knee prior to therapy.  Pt able to tolerate increased time or reps with all exercises today.  Pt noted mild increase in fatigue with increased reps/time, but no pain.  Pt denied any change in pain at completion of today's treatment session.   OBJECTIVE IMPAIRMENTS: Abnormal gait, decreased activity tolerance, decreased balance, decreased coordination, decreased knowledge of use of DME, decreased mobility, difficulty walking, decreased strength, improper body mechanics, postural dysfunction, and pain.   ACTIVITY LIMITATIONS: bending, standing, squatting, stairs, transfers, and locomotion level  PARTICIPATION LIMITATIONS: driving, shopping, and community activity  PERSONAL FACTORS: Age, Behavior pattern, Fitness, Past/current experiences, and Time since onset of injury/illness/exacerbation are also affecting patient's functional outcome.   REHAB POTENTIAL: Fair chronicity of impairments   CLINICAL  DECISION MAKING: Evolving/moderate complexity  EVALUATION COMPLEXITY: Moderate  GOALS: Goals reviewed with patient? Yes  SHORT TERM GOALS: Target date: 08/09/2023   Will be compliant with appropriate progressive HEP  Baseline: Goal status: INITIAL  2.  Step lengths/stance times to be equal B and will be able to ambulate more consistently with upright posture/appropriate distance from rollator  Baseline:  Goal status: INITIAL  3.  Will report no more than 1 fall in the past 4 weeks  Baseline:  Goal status: INITIAL  4.  Will be able to complete TUG test in 20 seconds or less LRAD  Baseline:  Goal status: INITIAL  LONG TERM GOALS: Target date: 09/06/2023   MMT to improve by at least 1 grade all weak groups  Baseline:  Goal status: INITIAL  2.  Will report no falls within the past 4 weeks  Baseline:  Goal status: INITIAL  3.  Will score at least 48 on Berg balance test  Baseline:  Goal status: INITIAL  4.  Will be able to ambulate community distances with fatigue no more than 3/10, appropriate use of LRAD Baseline:  Goal status: INITIAL  5.  Will be compliant with appropriate long term exercise program (land based vs water based vs silver sneakers group class) to maintain functional gains and prevent return of functional impairments  Baseline:  Goal status: INITIAL  6.  Will be able to complete 5xSTS test in 15 seconds or less no posterior LOB  Baseline:  Goal status: INITIAL  PLAN:  PT FREQUENCY: 2x/week  PT DURATION: 8 weeks  PLANNED INTERVENTIONS: Therapeutic exercises, Therapeutic activity, Neuromuscular re-education, Gait training, Self Care, and Manual therapy  PLAN FOR NEXT SESSION: Nustep for reciprocal movement training, gait and balance training, strength, gait training  outdoors as well. Try RW instead of 4WW and see if she's a bit safer with it?   Rexene Agent, PTA 08/02/23 12:34 PM

## 2023-08-04 DIAGNOSIS — G319 Degenerative disease of nervous system, unspecified: Secondary | ICD-10-CM | POA: Diagnosis not present

## 2023-08-04 DIAGNOSIS — I10 Essential (primary) hypertension: Secondary | ICD-10-CM | POA: Diagnosis not present

## 2023-08-04 DIAGNOSIS — E785 Hyperlipidemia, unspecified: Secondary | ICD-10-CM | POA: Diagnosis not present

## 2023-08-04 DIAGNOSIS — R4781 Slurred speech: Secondary | ICD-10-CM | POA: Diagnosis not present

## 2023-08-04 DIAGNOSIS — I639 Cerebral infarction, unspecified: Secondary | ICD-10-CM | POA: Diagnosis not present

## 2023-08-04 DIAGNOSIS — R202 Paresthesia of skin: Secondary | ICD-10-CM | POA: Diagnosis not present

## 2023-08-04 DIAGNOSIS — I6789 Other cerebrovascular disease: Secondary | ICD-10-CM | POA: Diagnosis not present

## 2023-08-04 DIAGNOSIS — I672 Cerebral atherosclerosis: Secondary | ICD-10-CM | POA: Diagnosis not present

## 2023-08-04 DIAGNOSIS — R079 Chest pain, unspecified: Secondary | ICD-10-CM | POA: Diagnosis not present

## 2023-08-04 DIAGNOSIS — R479 Unspecified speech disturbances: Secondary | ICD-10-CM | POA: Diagnosis not present

## 2023-08-04 DIAGNOSIS — R29898 Other symptoms and signs involving the musculoskeletal system: Secondary | ICD-10-CM | POA: Diagnosis not present

## 2023-08-04 DIAGNOSIS — I700 Atherosclerosis of aorta: Secondary | ICD-10-CM | POA: Diagnosis not present

## 2023-08-04 DIAGNOSIS — R001 Bradycardia, unspecified: Secondary | ICD-10-CM | POA: Diagnosis not present

## 2023-08-04 DIAGNOSIS — R200 Anesthesia of skin: Secondary | ICD-10-CM | POA: Diagnosis not present

## 2023-08-04 DIAGNOSIS — I6389 Other cerebral infarction: Secondary | ICD-10-CM | POA: Diagnosis not present

## 2023-08-04 DIAGNOSIS — R29810 Facial weakness: Secondary | ICD-10-CM | POA: Diagnosis not present

## 2023-08-04 DIAGNOSIS — F0390 Unspecified dementia without behavioral disturbance: Secondary | ICD-10-CM | POA: Diagnosis not present

## 2023-08-04 DIAGNOSIS — G8929 Other chronic pain: Secondary | ICD-10-CM | POA: Diagnosis not present

## 2023-08-05 DIAGNOSIS — I350 Nonrheumatic aortic (valve) stenosis: Secondary | ICD-10-CM | POA: Diagnosis not present

## 2023-08-05 DIAGNOSIS — I10 Essential (primary) hypertension: Secondary | ICD-10-CM | POA: Diagnosis not present

## 2023-08-05 DIAGNOSIS — F0390 Unspecified dementia without behavioral disturbance: Secondary | ICD-10-CM | POA: Diagnosis not present

## 2023-08-05 DIAGNOSIS — I639 Cerebral infarction, unspecified: Secondary | ICD-10-CM | POA: Diagnosis not present

## 2023-08-05 DIAGNOSIS — K219 Gastro-esophageal reflux disease without esophagitis: Secondary | ICD-10-CM | POA: Diagnosis not present

## 2023-08-05 DIAGNOSIS — R55 Syncope and collapse: Secondary | ICD-10-CM | POA: Diagnosis not present

## 2023-08-05 DIAGNOSIS — I2510 Atherosclerotic heart disease of native coronary artery without angina pectoris: Secondary | ICD-10-CM | POA: Diagnosis not present

## 2023-08-07 ENCOUNTER — Ambulatory Visit: Payer: Medicare PPO

## 2023-08-07 DIAGNOSIS — R2689 Other abnormalities of gait and mobility: Secondary | ICD-10-CM | POA: Diagnosis not present

## 2023-08-07 DIAGNOSIS — Z9181 History of falling: Secondary | ICD-10-CM | POA: Diagnosis not present

## 2023-08-07 DIAGNOSIS — R2681 Unsteadiness on feet: Secondary | ICD-10-CM

## 2023-08-07 DIAGNOSIS — M6281 Muscle weakness (generalized): Secondary | ICD-10-CM

## 2023-08-07 NOTE — Therapy (Signed)
OUTPATIENT PHYSICAL THERAPY LOWER EXTREMITY TREATMENT   Patient Name: Shelly Bennett MRN: [id number] DOB:1951-02-23, 72 y.o., female Today's Date: 08/07/2023  END OF SESSION:  PT End of Session - 08/07/23 1109     Visit Number 7    Number of Visits 17    Date for PT Re-Evaluation 09/06/23    Authorization Type [employer] MCR    Authorization Time Period 07/12/23 to 09/06/23    Progress Note Due on Visit 10    PT Start Time 1100    PT Stop Time 1146    PT Time Calculation (min) 46 min    Activity Tolerance Patient tolerated treatment well    Behavior During Therapy [facility] for tasks assessed/performed             Past Medical History:  Diagnosis Date   Adenomatous colon polyp    Allergic drug reaction 06/25/2015   Allergy    Anxiety    Arthritis    At risk for falls    fallen 4 times recently    CAD (coronary artery disease)    Dr. Excell Seltzer follows    Carpal tunnel syndrome    Chronic headaches    Coccydynia 02/01/2012   Cystocele    Diverticulosis    External hemorrhoids    Focal nodular hyperplasia of liver    Gait abnormality    GERD (gastroesophageal reflux disease)    some   HCAP (healthcare-associated pneumonia) 06/25/2015   Heart murmur    HLD (hyperlipidemia)    HTN (hypertension)    IBS (irritable bowel syndrome)    Memory loss    Neuromuscular disorder (HCC)    some neuropathy issues in the past- legs fibromyalgia in past-- past hx of steroid injections   Obesity    Osteoporosis    Otitis of left ear 02/01/2017   Pneumonia    Skin cancer    squamous cell - face   Past Surgical History:  Procedure Laterality Date   CARPAL TUNNEL RELEASE     COLONOSCOPY  12/15/2008   diverticulosis, external hemorrhoids   KNEE SURGERY  05/22/2015   right   LAPAROSCOPY     SKIN SURGERY     squamous cell - right face    Patient Active Problem List   Diagnosis Date Noted   Dementia (HCC) 05/02/2023   Frequent falls 03/12/2023   Parkinsonism 01/24/2023    Urinary incontinence 01/24/2023   Pain in right knee 12/05/2022   Pain in left knee 10/23/2022   Bilateral primary osteoarthritis of knee 10/04/2022   Gait abnormality 03/10/2020   Gastroesophageal reflux disease 10/19/2019   Third degree uterine prolapse 10/19/2019   Spinal stenosis of lumbar region without neurogenic claudication 09/15/2019   Urinary and fecal incontinence 09/15/2019   Cystocele with rectocele 09/15/2019   Low back pain with sciatica 07/22/2018   Left lumbar radiculopathy 01/22/2018   Cognitive impairment 10/22/2017   Anxiety 10/22/2017   Aortic atherosclerosis (HCC) 12/04/2016   Vitamin D deficiency 08/19/2014   Osteopenia 08/12/2013   CAD (coronary artery disease), native coronary artery 03/22/2013   Aortic valve stenosis, mild 03/22/2013   Hyperlipemia 03/11/2013   Hypertension 03/11/2013   Allergic rhinitis 03/11/2013   IBS (irritable bowel syndrome) - with chronic recurrent abdominal pain 02/01/2012   Personal history of colonic polyps - adenoma 02/01/2012   PCP: Arville Care MD   REFERRING PROVIDER: Levert Feinstein, MD  REFERRING DIAG: R41.89 (ICD-10-CM) - Cognitive impairment R32 (ICD-10-CM) - Urinary incontinence, unspecified type M54.40 (ICD-10-CM) -  Low back pain with sciatica, sciatica laterality unspecified, unspecified back pain laterality, unspecified chronicity R26.9 (ICD-10-CM) - Gait abnormality  THERAPY DIAG:  Unsteadiness on feet  Muscle weakness (generalized)  Other abnormalities of gait and mobility  History of falling  Rationale for Evaluation and Treatment: Rehabilitation  ONSET DATE: chronic   SUBJECTIVE:   SUBJECTIVE STATEMENT: Patient reports that she had to go to the hospital on Sunday (08/04/23) after she slumped over at church. She is unsure what happened, but she was in the hospital for two days. She was told that everything was clear, but she is wearing a heart monitor for 16 days.   PERTINENT HISTORY: Anxiety, hx  falls, CAD, carpal tunnel, gait abnormality, HCAP, heart murmur, HLD, HTN, IBS, memory loss, neuromuscular disorder, knee surgery, Parkinsonism  PAIN:  Are you having pain? Yes: NPRS scale: 0/10 Pain location: left knee    PRECAUTIONS: None  RED FLAGS: None   WEIGHT BEARING RESTRICTIONS: No  FALLS:  Has patient fallen in last 6 months? Yes. Number of falls 1 about a week ago   PLOF: Independent, Independent with household mobility with device, Independent with community mobility with device, and Requires assistive device for independence  PATIENT GOALS: be able to get legs working/be able to walk better   NEXT MD VISIT: October 10th with referring   OBJECTIVE:   PATIENT SURVEYS:  FOTO will take 2nd session   COGNITION: Overall cognitive status: Within functional limits for tasks assessed     SENSATION: L LE numbness all the time "feels like dragging a cinder block"   LOWER EXTREMITY MMT:  MMT Right eval Left eval  Hip flexion 3 3  Hip extension    Hip abduction 3 at best 3 at best   Hip adduction    Hip internal rotation    Hip external rotation    Knee flexion 4 4-  Knee extension 4+ 4  Ankle dorsiflexion 5 5  Ankle plantarflexion    Ankle inversion    Ankle eversion     (Blank rows = not tested)  FUNCTIONAL TESTS:  5 times sit to stand: 27 seconds BUEs on rollator posterior LOB with initial power up to stand  Timed up and go (TUG): 40 seconds with rollator   GAIT: Distance walked: in clinic distances  Assistive device utilized: Environmental consultant - 4 wheeled Level of assistance: Modified independence Comments: L LE with very short step lengths, tends to stick and freeze with poor progression, trunk flexed, limited stance time R LE, tends to push rollator too far ahead, some freezing and difficulty noted on turns   TODAY'S TREATMENT:                                                                                                                              DATE:  08/07/23 EXERCISE LOG  Exercise Repetitions and Resistance Comments  Nustep  L1 x 15 minutes    Seated heel/toe raises  3 minutes    LAQ 3# x 3 minutes Alternating LE   Seated marching  3# x 3 minutes Alternating LE   Ball toss  5 minutes  Standing   Blank cell = exercise not performed today    08/02/23  EXERCISE LOG  Exercise Repetitions and Resistance Comments  Nustep L1-2 x17 min   LAQ 2# x25  reps bil   Seated hip flexion 2# x25 reps bil   Seated heel/toe raises X30 reps   Seated hip abduction Yellow tband x 2 mins   Seated Toe Taps 4" step x 25 reps bil    Seated hip adduction X3 mins   Seated ham curls Yellow x 25 reps bil    Blank cell = exercise not performed today   PATIENT EDUCATION:  Education details: exam findings, HEP, POC  Person educated: Patient Education method: Explanation, Demonstration, and Handouts Education comprehension: verbalized understanding, returned demonstration, and needs further education  HOME EXERCISE PROGRAM: Access Code: [email or web address] URL: [email or web address] Date: 07/12/2023 Prepared by: Nedra Hai  Exercises - Seated Hip Abduction with Resistance  - 2 x daily - 7 x weekly - 1 sets - 10 reps - 2 seconds hold - Seated March with Resistance  - 2 x daily - 7 x weekly - 1 sets - 10 reps - 2 seconds hold - Sit to Stand with Armchair  - 2 x daily - 7 x weekly - 1 sets - 10 reps  ASSESSMENT:  CLINICAL IMPRESSION: Patient presented to treatment reporting a recent hospitalization on 08/04/23. She reported feeling no significant change since her last appointment. However, her five time sit to stand test regressed from 27 seconds at her initial evaluation to 36 seconds at today's appointment, but she did not experience a loss of balance with this test. She exhibited no significant strength deficits compared to her initial evaluation. Today's treatment focused on familiar interventions for improved lower  extremity strength. She required close supervision with today's ball toss intervention for safety. She reported feeling good upon the conclusion of treatment. Recommend that she continue with skilled physical therapy to address her remaining impairments to maximize her safety and functional mobility.   OBJECTIVE IMPAIRMENTS: Abnormal gait, decreased activity tolerance, decreased balance, decreased coordination, decreased knowledge of use of DME, decreased mobility, difficulty walking, decreased strength, improper body mechanics, postural dysfunction, and pain.   ACTIVITY LIMITATIONS: bending, standing, squatting, stairs, transfers, and locomotion level  PARTICIPATION LIMITATIONS: driving, shopping, and community activity  PERSONAL FACTORS: Age, Behavior pattern, Fitness, Past/current experiences, and Time since onset of injury/illness/exacerbation are also affecting patient's functional outcome.   REHAB POTENTIAL: Fair chronicity of impairments   CLINICAL DECISION MAKING: Evolving/moderate complexity  EVALUATION COMPLEXITY: Moderate  GOALS: Goals reviewed with patient? Yes  SHORT TERM GOALS: Target date: 08/09/2023   Will be compliant with appropriate progressive HEP  Baseline: Goal status: MET  2.  Step lengths/stance times to be equal B and will be able to ambulate more consistently with upright posture/appropriate distance from rollator  Baseline:  Goal status: ON GOING   3.  Will report no more than 1 fall in the past 4 weeks  Baseline:  Goal status: MET  4.  Will be able to complete TUG test in 20 seconds or less LRAD  Baseline: 40.11 seconds with Rolator  Goal status: ON GOING  LONG TERM GOALS: Target  date: 09/06/2023   MMT to improve by at least 1 grade all weak groups  Baseline:  Goal status: INITIAL  2.  Will report no falls within the past 4 weeks  Baseline:  Goal status: ON GOING  3.  Will score at least 48 on Berg balance test  Baseline:  Goal status:  INITIAL  4.  Will be able to ambulate community distances with fatigue no more than 3/10, appropriate use of LRAD Baseline:  Goal status: ON GOING  5.  Will be compliant with appropriate long term exercise program (land based vs water based vs silver sneakers group class) to maintain functional gains and prevent return of functional impairments  Baseline:  Goal status: ON GOING  6.  Will be able to complete 5xSTS test in 15 seconds or less no posterior LOB  Baseline: 36.60 seconds with upper extremity support Goal status: ON GOING  PLAN:  PT FREQUENCY: 2x/week  PT DURATION: 8 weeks  PLANNED INTERVENTIONS: Therapeutic exercises, Therapeutic activity, Neuromuscular re-education, Gait training, Self Care, and Manual therapy  PLAN FOR NEXT SESSION: Nustep for reciprocal movement training, gait and balance training, strength, gait training outdoors as well. Try RW instead of 4WW and see if she's a bit safer with it?   Candi Leash, PT, DPT  08/07/23 2:06 PM

## 2023-08-09 ENCOUNTER — Ambulatory Visit: Payer: Medicare PPO

## 2023-08-09 DIAGNOSIS — M6281 Muscle weakness (generalized): Secondary | ICD-10-CM | POA: Diagnosis not present

## 2023-08-09 DIAGNOSIS — R2681 Unsteadiness on feet: Secondary | ICD-10-CM

## 2023-08-09 DIAGNOSIS — R2689 Other abnormalities of gait and mobility: Secondary | ICD-10-CM | POA: Diagnosis not present

## 2023-08-09 DIAGNOSIS — Z9181 History of falling: Secondary | ICD-10-CM | POA: Diagnosis not present

## 2023-08-09 NOTE — Therapy (Signed)
OUTPATIENT PHYSICAL THERAPY LOWER EXTREMITY TREATMENT   Patient Name: Shelly Bennett MRN: [id number] DOB:[birth year], 72 y.o., female Today's Date: 08/09/2023  END OF SESSION:  PT End of Session - 08/09/23 1108     Visit Number 8    Number of Visits 17    Date for PT Re-Evaluation 09/06/23    Authorization Type [employer] MCR    Authorization Time Period 07/12/23 to 09/06/23    Progress Note Due on Visit 10    PT Start Time 1100    PT Stop Time 1145    PT Time Calculation (min) 45 min    Activity Tolerance Patient tolerated treatment well    Behavior During Therapy [facility] for tasks assessed/performed             Past Medical History:  Diagnosis Date   Adenomatous colon polyp    Allergic drug reaction 06/25/2015   Allergy    Anxiety    Arthritis    At risk for falls    fallen 4 times recently    CAD (coronary artery disease)    Dr. Excell Seltzer follows    Carpal tunnel syndrome    Chronic headaches    Coccydynia 02/01/2012   Cystocele    Diverticulosis    External hemorrhoids    Focal nodular hyperplasia of liver    Gait abnormality    GERD (gastroesophageal reflux disease)    some   HCAP (healthcare-associated pneumonia) 06/25/2015   Heart murmur    HLD (hyperlipidemia)    HTN (hypertension)    IBS (irritable bowel syndrome)    Memory loss    Neuromuscular disorder (HCC)    some neuropathy issues in the past- legs fibromyalgia in past-- past hx of steroid injections   Obesity    Osteoporosis    Otitis of left ear 02/01/2017   Pneumonia    Skin cancer    squamous cell - face   Past Surgical History:  Procedure Laterality Date   CARPAL TUNNEL RELEASE     COLONOSCOPY  12/15/2008   diverticulosis, external hemorrhoids   KNEE SURGERY  05/22/2015   right   LAPAROSCOPY     SKIN SURGERY     squamous cell - right face    Patient Active Problem List   Diagnosis Date Noted   Dementia (HCC) 05/02/2023   Frequent falls 03/12/2023   Parkinsonism 01/24/2023    Urinary incontinence 01/24/2023   Pain in right knee 12/05/2022   Pain in left knee 10/23/2022   Bilateral primary osteoarthritis of knee 10/04/2022   Gait abnormality 03/10/2020   Gastroesophageal reflux disease 10/19/2019   Third degree uterine prolapse 10/19/2019   Spinal stenosis of lumbar region without neurogenic claudication 09/15/2019   Urinary and fecal incontinence 09/15/2019   Cystocele with rectocele 09/15/2019   Low back pain with sciatica 07/22/2018   Left lumbar radiculopathy 01/22/2018   Cognitive impairment 10/22/2017   Anxiety 10/22/2017   Aortic atherosclerosis (HCC) 12/04/2016   Vitamin D deficiency 08/19/2014   Osteopenia 08/12/2013   CAD (coronary artery disease), native coronary artery 03/22/2013   Aortic valve stenosis, mild 03/22/2013   Hyperlipemia 03/11/2013   Hypertension 03/11/2013   Allergic rhinitis 03/11/2013   IBS (irritable bowel syndrome) - with chronic recurrent abdominal pain 02/01/2012   Personal history of colonic polyps - adenoma 02/01/2012   PCP: Arville Care MD   REFERRING PROVIDER: Levert Feinstein, MD  REFERRING DIAG: R41.89 (ICD-10-CM) - Cognitive impairment R32 (ICD-10-CM) - Urinary incontinence, unspecified type M54.40 (ICD-10-CM) -  Low back pain with sciatica, sciatica laterality unspecified, unspecified back pain laterality, unspecified chronicity R26.9 (ICD-10-CM) - Gait abnormality  THERAPY DIAG:  Unsteadiness on feet  Muscle weakness (generalized)  Other abnormalities of gait and mobility  History of falling  Rationale for Evaluation and Treatment: Rehabilitation  ONSET DATE: chronic   SUBJECTIVE:   SUBJECTIVE STATEMENT: Pt reports feeling pretty good today as yesterday was her birthday.  Left knee with minimal pain.   PERTINENT HISTORY: Anxiety, hx falls, CAD, carpal tunnel, gait abnormality, HCAP, heart murmur, HLD, HTN, IBS, memory loss, neuromuscular disorder, knee surgery, Parkinsonism  PAIN:  Are you having  pain? Yes: NPRS scale: 2/10 Pain location: left knee    PRECAUTIONS: None  RED FLAGS: None   WEIGHT BEARING RESTRICTIONS: No  FALLS:  Has patient fallen in last 6 months? Yes. Number of falls 1 about a week ago   PLOF: Independent, Independent with household mobility with device, Independent with community mobility with device, and Requires assistive device for independence  PATIENT GOALS: be able to get legs working/be able to walk better   NEXT MD VISIT: October 10th with referring   OBJECTIVE:   PATIENT SURVEYS:  FOTO will take 2nd session   COGNITION: Overall cognitive status: Within functional limits for tasks assessed     SENSATION: L LE numbness all the time "feels like dragging a cinder block"   LOWER EXTREMITY MMT:  MMT Right eval Left eval  Hip flexion 3 3  Hip extension    Hip abduction 3 at best 3 at best   Hip adduction    Hip internal rotation    Hip external rotation    Knee flexion 4 4-  Knee extension 4+ 4  Ankle dorsiflexion 5 5  Ankle plantarflexion    Ankle inversion    Ankle eversion     (Blank rows = not tested)  FUNCTIONAL TESTS:  5 times sit to stand: 27 seconds BUEs on rollator posterior LOB with initial power up to stand  Timed up and go (TUG): 40 seconds with rollator   GAIT: Distance walked: in clinic distances  Assistive device utilized: Environmental consultant - 4 wheeled Level of assistance: Modified independence Comments: L LE with very short step lengths, tends to stick and freeze with poor progression, trunk flexed, limited stance time R LE, tends to push rollator too far ahead, some freezing and difficulty noted on turns   TODAY'S TREATMENT:                                                                                                                              DATE:                                   08/09/23 EXERCISE LOG  Exercise Repetitions and Resistance Comments  Nustep  L2 x 15 minutes    Seated heel/toe raises  3.5  minutes     LAQ 3# x 3.5 minutes Alternating LE   Seated marching  3# x 3.5 minutes Alternating LE   Ball toss  5 minutes  Standing   Blank cell = exercise not performed today    08/02/23  EXERCISE LOG  Exercise Repetitions and Resistance Comments  Nustep L1-2 x17 min   LAQ 2# x25  reps bil   Seated hip flexion 2# x25 reps bil   Seated heel/toe raises X30 reps   Seated hip abduction Yellow tband x 2 mins   Seated Toe Taps 4" step x 25 reps bil    Seated hip adduction X3 mins   Seated ham curls Yellow x 25 reps bil    Blank cell = exercise not performed today   PATIENT EDUCATION:  Education details: exam findings, HEP, POC  Person educated: Patient Education method: Explanation, Demonstration, and Handouts Education comprehension: verbalized understanding, returned demonstration, and needs further education  HOME EXERCISE PROGRAM: Access Code: [email or web address] URL: [email or web address] Date: 07/12/2023 Prepared by: Nedra Hai  Exercises - Seated Hip Abduction with Resistance  - 2 x daily - 7 x weekly - 1 sets - 10 reps - 2 seconds hold - Seated March with Resistance  - 2 x daily - 7 x weekly - 1 sets - 10 reps - 2 seconds hold - Sit to Stand with Armchair  - 2 x daily - 7 x weekly - 1 sets - 10 reps  ASSESSMENT:  CLINICAL IMPRESSION: Pt arrives for today's treatment session reporting 2/10 left knee pain.  Pt able to tolerate increased time with all exercises today.  Pt challenged by standing ball toss today.  Pt showing increased activity tolerance and endurance with completion of exercises.  Pt denied any change in pain at completion of today's treatment session.  OBJECTIVE IMPAIRMENTS: Abnormal gait, decreased activity tolerance, decreased balance, decreased coordination, decreased knowledge of use of DME, decreased mobility, difficulty walking, decreased strength, improper body mechanics, postural dysfunction, and pain.   ACTIVITY LIMITATIONS: bending, standing, squatting,  stairs, transfers, and locomotion level  PARTICIPATION LIMITATIONS: driving, shopping, and community activity  PERSONAL FACTORS: Age, Behavior pattern, Fitness, Past/current experiences, and Time since onset of injury/illness/exacerbation are also affecting patient's functional outcome.   REHAB POTENTIAL: Fair chronicity of impairments   CLINICAL DECISION MAKING: Evolving/moderate complexity  EVALUATION COMPLEXITY: Moderate  GOALS: Goals reviewed with patient? Yes  SHORT TERM GOALS: Target date: 08/09/2023   Will be compliant with appropriate progressive HEP  Baseline: Goal status: MET  2.  Step lengths/stance times to be equal B and will be able to ambulate more consistently with upright posture/appropriate distance from rollator  Baseline:  Goal status: ON GOING   3.  Will report no more than 1 fall in the past 4 weeks  Baseline:  Goal status: MET  4.  Will be able to complete TUG test in 20 seconds or less LRAD  Baseline: 40.11 seconds with Rolator  Goal status: ON GOING  LONG TERM GOALS: Target date: 09/06/2023   MMT to improve by at least 1 grade all weak groups  Baseline:  Goal status: INITIAL  2.  Will report no falls within the past 4 weeks  Baseline:  Goal status: ON GOING  3.  Will score at least 48 on Berg balance test  Baseline:  Goal status: INITIAL  4.  Will be able to ambulate community distances with fatigue no more than 3/10, appropriate use of LRAD Baseline:  Goal status: ON GOING  5.  Will be compliant with appropriate long term exercise program (land based vs water based vs silver sneakers group class) to maintain functional gains and prevent return of functional impairments  Baseline:  Goal status: ON GOING  6.  Will be able to complete 5xSTS test in 15 seconds or less no posterior LOB  Baseline: 36.60 seconds with upper extremity support Goal status: ON GOING  PLAN:  PT FREQUENCY: 2x/week  PT DURATION: 8 weeks  PLANNED  INTERVENTIONS: Therapeutic exercises, Therapeutic activity, Neuromuscular re-education, Gait training, Self Care, and Manual therapy  PLAN FOR NEXT SESSION: Nustep for reciprocal movement training, gait and balance training, strength, gait training outdoors as well. Try RW instead of 4WW and see if she's a bit safer with it?   Candi Leash, PT, DPT  08/09/23 12:50 PM

## 2023-08-14 ENCOUNTER — Ambulatory Visit: Payer: Medicare PPO | Admitting: Physical Therapy

## 2023-08-14 ENCOUNTER — Ambulatory Visit (INDEPENDENT_AMBULATORY_CARE_PROVIDER_SITE_OTHER): Payer: Medicare PPO

## 2023-08-14 ENCOUNTER — Encounter: Payer: Self-pay | Admitting: Physical Therapy

## 2023-08-14 VITALS — Ht 67.0 in | Wt 180.0 lb

## 2023-08-14 DIAGNOSIS — Z1231 Encounter for screening mammogram for malignant neoplasm of breast: Secondary | ICD-10-CM

## 2023-08-14 DIAGNOSIS — M6281 Muscle weakness (generalized): Secondary | ICD-10-CM

## 2023-08-14 DIAGNOSIS — R2689 Other abnormalities of gait and mobility: Secondary | ICD-10-CM | POA: Diagnosis not present

## 2023-08-14 DIAGNOSIS — R2681 Unsteadiness on feet: Secondary | ICD-10-CM | POA: Diagnosis not present

## 2023-08-14 DIAGNOSIS — Z9181 History of falling: Secondary | ICD-10-CM

## 2023-08-14 DIAGNOSIS — Z0000 Encounter for general adult medical examination without abnormal findings: Secondary | ICD-10-CM

## 2023-08-14 DIAGNOSIS — Z1211 Encounter for screening for malignant neoplasm of colon: Secondary | ICD-10-CM

## 2023-08-14 NOTE — Patient Instructions (Signed)
Shelly Bennett , Thank you for taking time to come for your Medicare Wellness Visit. I appreciate your ongoing commitment to your health goals. Please review the following plan we discussed and let me know if I can assist you in the future.   Referrals/Orders/Follow-Ups/Clinician Recommendations: Aim for 30 minutes of exercise or brisk walking, 6-8 glasses of water, and 5 servings of fruits and vegetables each day.   This is a list of the screening recommended for you and due dates:  Health Maintenance  Topic Date Due   Mammogram  08/11/2023   Flu Shot  07/18/2023   Zoster (Shingles) Vaccine (1 of 2) 12/30/2023*   Colon Cancer Screening  06/26/2024*   Hepatitis C Screening  06/26/2024*   Stool Blood Test  06/29/2024*   Medicare Annual Wellness Visit  08/13/2024   DTaP/Tdap/Td vaccine (2 - Td or Tdap) 05/02/2027   Pneumonia Vaccine  Completed   DEXA scan (bone density measurement)  Completed   HPV Vaccine  Aged Out   COVID-19 Vaccine  Discontinued  *Topic was postponed. The date shown is not the original due date.    Advanced directives: (Copy Requested) Please bring a copy of your health care power of attorney and living will to the office to be added to your chart at your convenience.  Next Medicare Annual Wellness Visit scheduled for next year: Yes  Insert Preventive Care attachment Insert FALL PREVENTION attachment if needed

## 2023-08-14 NOTE — Progress Notes (Signed)
Subjective:   Shelly Bennett is a 72 y.o. female who presents for Medicare Annual (Subsequent) preventive examination.  Visit Complete: Virtual  I connected with  Shelly Bennett on 08/14/23 by a audio enabled telemedicine application and verified that I am speaking with the correct person using two identifiers.  Patient Location: Home  Provider Location: Home Office  I discussed the limitations of evaluation and management by telemedicine. The patient expressed understanding and agreed to proceed.  Patient Medicare AWV questionnaire was completed by the patient on 08/14/2023; I have confirmed that all information answered by patient is correct and no changes since this date.  Review of Systems    Vital Signs: Unable to obtain new vitals due to this being a telehealth visit.  Cardiac Risk Factors include: advanced age (>10men, >17 women);dyslipidemia;hypertension     Objective:    Today's Vitals   08/14/23 0902  Weight: 180 lb (81.6 kg)  Height: 5\' 7"  (1.702 m)   Body mass index is 28.19 kg/m.     08/14/2023    9:07 AM 07/12/2023    9:34 AM 03/12/2023   11:00 PM 02/06/2023    3:31 PM 07/24/2022    2:15 PM 06/20/2021    2:55 PM 10/13/2020    1:17 PM  Advanced Directives  Does Patient Have a Medical Advance Directive? Yes Yes Yes Yes Yes Yes Yes  Type of Estate agent of [place];Living will Healthcare Power of [place];Living will Healthcare Power of [place];Living will  Healthcare Power of [place];Living will Healthcare Power of [place];Living will   Does patient want to make changes to medical advance directive?  No - Patient declined No - Guardian declined      Copy of Healthcare Power of Attorney in Chart? No - copy requested No - copy requested No - copy requested  No - copy requested No - copy requested     Current Medications (verified) Outpatient Encounter Medications as of 08/14/2023  Medication Sig   amLODipine (NORVASC) 10 MG  tablet Take 1 tablet (10 mg total) by mouth daily.   aspirin 81 MG tablet Take 81 mg by mouth daily.    buPROPion (WELLBUTRIN XL) 150 MG 24 hr tablet Take 1 tablet (150 mg total) by mouth daily.   calcium carbonate (OSCAL) 1500 (600 Ca) MG TABS tablet Take 600 mg of elemental calcium by mouth daily.   Cholecalciferol (VITAMIN D-3) 5000 UNITS TABS Take 1 capsule by mouth See admin instructions. Take one capsule by mouth daily Mon thru Friday   diclofenac (VOLTAREN) 50 MG EC tablet Take 1 tablet (50 mg total) by mouth 2 (two) times daily.   donepezil (ARICEPT) 10 MG tablet Take 1 tablet (10 mg total) by mouth at bedtime.   hydrochlorothiazide (HYDRODIURIL) 25 MG tablet TAKE 1/2 TABLET DAILY   memantine (NAMENDA) 10 MG tablet Take 1 tablet (10 mg total) by mouth 2 (two) times daily.   montelukast (SINGULAIR) 10 MG tablet Take 1 tablet (10 mg total) by mouth at bedtime.   omega-3 acid ethyl esters (LOVAZA) 1 g capsule TAKE TWO CAPSULES TWICE DAILY (Patient taking differently: Take 2 g by mouth 2 (two) times daily.)   polyethylene glycol (MIRALAX / GLYCOLAX) 17 g packet Take 17 g by mouth 2 (two) times daily.   rosuvastatin (CRESTOR) 20 MG tablet Take 1 tablet (20 mg total) by mouth daily.   senna-docusate (SENOKOT-S) 8.6-50 MG tablet Take 1 tablet by mouth 2 (two) times daily.   No facility-administered  encounter medications on file as of 08/14/2023.    Allergies (verified) Neomycin-polymyxin-dexameth, Codeine, Mold extract [trichophyton], Penicillins, Ace inhibitors, Angiotensin receptor blockers, Levaquin [levofloxacin], Vytorin [ezetimibe-simvastatin], and Zetia [ezetimibe]   History: Past Medical History:  Diagnosis Date   Adenomatous colon polyp    Allergic drug reaction 06/25/2015   Allergy    Anxiety    Arthritis    At risk for falls    fallen 4 times recently    CAD (coronary artery disease)    Dr. Excell Seltzer follows    Carpal tunnel syndrome    Chronic headaches    Coccydynia  02/01/2012   Cystocele    Diverticulosis    External hemorrhoids    Focal nodular hyperplasia of liver    Gait abnormality    GERD (gastroesophageal reflux disease)    some   HCAP (healthcare-associated pneumonia) 06/25/2015   Heart murmur    HLD (hyperlipidemia)    HTN (hypertension)    IBS (irritable bowel syndrome)    Memory loss    Neuromuscular disorder (HCC)    some neuropathy issues in the past- legs fibromyalgia in past-- past hx of steroid injections   Obesity    Osteoporosis    Otitis of left ear 02/01/2017   Pneumonia    Skin cancer    squamous cell - face   Past Surgical History:  Procedure Laterality Date   CARPAL TUNNEL RELEASE     COLONOSCOPY  12/15/2008   diverticulosis, external hemorrhoids   KNEE SURGERY  05/22/2015   right   LAPAROSCOPY     SKIN SURGERY     squamous cell - right face    Family History  Problem Relation Age of Onset   Hyperlipidemia Mother    Hypertension Mother    Kidney disease Mother    Osteoporosis Mother    Heart murmur Mother    Dementia Mother    Prostate cancer Father    Colon cancer Father 20   Kidney disease Father    Heart disease Father    Alzheimer's disease Father    Hyperlipidemia Sister    Hypertension Sister    Rectal cancer Maternal Grandmother 79   Heart disease Maternal Grandfather    Heart disease Brother 6       not dx questionable    Colon polyps Neg Hx    Esophageal cancer Neg Hx    Stomach cancer Neg Hx    Breast cancer Neg Hx    Social History   Socioeconomic History   Marital status: Married    Spouse name: Shelly Bennett   Number of children: 4   Years of education: 16   Highest education level: Bachelor's degree (e.g., BA, AB, BS)  Occupational History   Occupation: Runner, broadcasting/film/video retired  Tobacco Use   Smoking status: Never   Smokeless tobacco: Never  Vaping Use   Vaping status: Never Used  Substance and Sexual Activity   Alcohol use: No   Drug use: No   Sexual activity: Not Currently    Birth  control/protection: None  Other Topics Concern   Not on file  Social History Narrative   Lives at home with her husband.   Right-handed.   1 cup coffee each morning. 2-3 small soda cans each day.   Social Determinants of Health   Financial Resource Strain: Low Risk  (08/14/2023)   Overall Financial Resource Strain (CARDIA)    Difficulty of Paying Living Expenses: Not hard at all  Food Insecurity: No Food Insecurity (08/14/2023)  Hunger Vital Sign    Worried About Running Out of Food in the Last Year: Never true    Ran Out of Food in the Last Year: Never true  Transportation Needs: No Transportation Needs (08/14/2023)   PRAPARE - Administrator, Civil Service (Medical): No    Lack of Transportation (Non-Medical): No  Physical Activity: Insufficiently Active (08/14/2023)   Exercise Vital Sign    Days of Exercise per Week: 3 days    Minutes of Exercise per Session: 30 min  Stress: No Stress Concern Present (08/14/2023)   [employer] of Occupational Health - Occupational Stress Questionnaire    Feeling of Stress : Not at all  Social Connections: Socially Integrated (08/14/2023)   Social Connection and Isolation Panel [NHANES]    Frequency of Communication with Friends and Family: More than three times a week    Frequency of Social Gatherings with Friends and Family: More than three times a week    Attends Religious Services: More than 4 times per year    Active Member of [employer] or Organizations: Yes    Attends Engineer, structural: More than 4 times per year    Marital Status: Married    Tobacco Counseling Counseling given: Not Answered   Clinical Intake:  Pre-visit preparation completed: Yes  Pain : No/denies pain     Nutritional Risks: None Diabetes: No  How often do you need to have someone help you when you read instructions, pamphlets, or other written materials from your doctor or pharmacy?: 1 - Never  Interpreter Needed?: No  Information  entered by :: Renie Ora, LPN   Activities of Daily Living    08/14/2023    9:07 AM 03/15/2023    5:14 PM  In your present state of health, do you have any difficulty performing the following activities:  Hearing? 0   Vision? 0   Difficulty concentrating or making decisions? 0   Walking or climbing stairs? 0   Dressing or bathing? 0   Doing errands, shopping? 0 1  Comment  Husband is primary care giver  Preparing Food and eating ? N   Using the Toilet? N   In the past six months, have you accidently leaked urine? N   Do you have problems with loss of bowel control? N   Managing your Medications? N   Managing your Finances? N   Housekeeping or managing your Housekeeping? N     Patient Care Team: Dettinger, Elige Radon, MD as PCP - General (Family Medicine) Tonny Bollman, MD as PCP - Cardiology (Cardiology) Herby Abraham, MD (Cardiology) Tonny Bollman, MD (Cardiology) Iva Boop, MD (Gastroenterology) Tracey Harries, MD (Obstetrics and Gynecology) Valeria Batman, MD (Inactive) (Orthopedic Surgery) Levert Feinstein, MD as Consulting Physician (Neurology)  Indicate any recent Medical Services you may have received from other than [facility] providers in the past year (date may be approximate).     Assessment:   This is a routine wellness examination for [facility].  Hearing/Vision screen Vision Screening - Comments:: Wears rx glasses - up to date with routine eye exams with  Dr.Gickingack   Dietary issues and exercise activities discussed:     Goals Addressed             This Visit's Progress    Exercise 3x per week (30 min per time)   On track      Depression Screen    08/14/2023    9:06 AM 06/27/2023   10:10  AM 03/21/2023    8:55 AM 12/31/2022   10:45 AM 11/29/2022   11:53 AM 07/24/2022    2:10 PM 03/05/2022    2:35 PM  PHQ 2/9 Scores  PHQ - 2 Score 0 4 6 2  0 0 0  PHQ- 9 Score 0 10 27 5        Fall Risk    08/14/2023    9:03 AM 06/27/2023   10:10 AM  03/21/2023    8:55 AM 12/31/2022   10:46 AM 11/29/2022   11:53 AM  Fall Risk   Falls in the past year? 0 1 1 0 0  Number falls in past yr: 0 1 1 0   Injury with Fall? 0 1 0 0   Risk for fall due to : No Fall Risks Impaired balance/gait;Impaired mobility Impaired balance/gait;History of fall(s) No Fall Risks   Follow up Falls prevention discussed Falls evaluation completed Falls evaluation completed Falls evaluation completed     MEDICARE RISK AT HOME: Medicare Risk at Home Any stairs in or around the home?: No If so, are there any without handrails?: No Home free of loose throw rugs in walkways, pet beds, electrical cords, etc?: Yes Adequate lighting in your home to reduce risk of falls?: Yes Life alert?: No Use of a cane, walker or w/c?: No Grab bars in the bathroom?: Yes Shower chair or bench in shower?: Yes Elevated toilet seat or a handicapped toilet?: Yes  TIMED UP AND GO:  Was the test performed?  No    Cognitive Function:    01/24/2023    1:44 PM 11/29/2022    4:26 PM 03/10/2020    3:07 PM 02/05/2019    9:21 AM 01/30/2018   10:35 AM  MMSE - Mini Mental State Exam  Orientation to time 1 4 5 4 5   Orientation to Place 5 5 5 5 5   Registration 3 3 3 3 3   Attention/ Calculation 3 5 5 5 5   Recall 2 3 1 3 3   Language- name 2 objects 2 2 2 2 2   Language- repeat 1 0 1 1 1   Language- follow 3 step command 3 3 3 3 3   Language- read & follow direction 1 0 1 1 1   Write a sentence 1 1 1 1 1   Copy design 1 1 1 1 1   Total score 23 27 28 29 30       05/02/2023   11:00 AM 05/02/2023    9:00 AM 03/27/2023    9:00 AM  Montreal Cognitive Assessment   Visuospatial/ Executive (0/5) 3 3 1   Naming (0/3) 3 3 3   Attention: Read list of digits (0/2) 2 2 2   Attention: Read list of letters (0/1) 1 1 0  Attention: Serial 7 subtraction starting at 100 (0/3) 1 3 2   Language: Repeat phrase (0/2) 2 2 2   Language : Fluency (0/1) 1 1 1   Abstraction (0/2) 2 2 2   Delayed Recall (0/5) 1 1 0   Orientation (0/6) 4 1 5   Total 20 19 18       08/14/2023    9:07 AM 07/24/2022    2:13 PM 06/20/2021    2:57 PM 06/16/2020   10:41 AM 06/16/2019    1:44 PM  6CIT Screen  What Year? 0 points 0 points 0 points 0 points 0 points  What month? 0 points 0 points 0 points 3 points 0 points  What time? 0 points 0 points 0 points 0 points 0 points  Count  back from 20 0 points 0 points 0 points 0 points 0 points  Months in reverse 0 points 2 points 0 points 0 points 0 points  Repeat phrase 2 points 8 points 0 points 2 points 0 points  Total Score 2 points 10 points 0 points 5 points 0 points    Immunizations Immunization History  Administered Date(s) Administered   Fluad Quad(high Dose 65+) 10/19/2019, 09/29/2020, 10/07/2021, 12/31/2022   Influenza Whole 11/16/2012   Influenza, High Dose Seasonal PF 09/15/2016, 09/04/2017, 09/24/2018   Influenza,inj,Quad PF,6+ Mos 09/15/2013, 10/06/2014, 11/01/2015   Moderna Sars-Covid-2 Vaccination 05/02/2020   Pneumococcal Conjugate-13 11/01/2015   Pneumococcal Polysaccharide-23 09/04/2017   Tdap 05/01/2017    TDAP status: Up to date  Flu Vaccine status: Up to date  Pneumococcal vaccine status: Up to date  Covid-19 vaccine status: Completed vaccines  Qualifies for Shingles Vaccine? Yes   Zostavax completed No   Shingrix Completed?: No.    Education has been provided regarding the importance of this vaccine. Patient has been advised to call insurance company to determine out of pocket expense if they have not yet received this vaccine. Advised may also receive vaccine at local pharmacy or Health Dept. Verbalized acceptance and understanding.  Screening Tests Health Maintenance  Topic Date Due   MAMMOGRAM  08/11/2023   INFLUENZA VACCINE  07/18/2023   Zoster Vaccines- Shingrix (1 of 2) 12/30/2023 (Originally 08/07/1970)   Colonoscopy  06/26/2024 (Originally 08/04/2022)   Hepatitis C Screening  06/26/2024 (Originally 08/07/1969)   COLON CANCER  SCREENING ANNUAL FOBT  06/29/2024 (Originally 08/04/2020)   Medicare Annual Wellness (AWV)  08/13/2024   DTaP/Tdap/Td (2 - Td or Tdap) 05/02/2027   Pneumonia Vaccine 74+ Years old  Completed   DEXA SCAN  Completed   HPV VACCINES  Aged Out   COVID-19 Vaccine  Discontinued    Health Maintenance  Health Maintenance Due  Topic Date Due   MAMMOGRAM  08/11/2023   INFLUENZA VACCINE  07/18/2023    Colorectal cancer screening: Referral to GI placed 08/14/2023. Pt aware the office will call re: appt.  Mammogram status: Ordered 08/14/2023. Pt provided with contact info and advised to call to schedule appt.   Bone Density status: Completed 03/28/2022. Results reflect: Bone density results: OSTEOPOROSIS. Repeat every 2 years.  Lung Cancer Screening: (Low Dose CT Chest recommended if Age 87-80 years, 20 pack-year currently smoking OR have quit w/in 15years.) does not qualify.   Lung Cancer Screening Referral: n/a  Additional Screening:  Hepatitis C Screening: does qualify;   Vision Screening: Recommended annual ophthalmology exams for early detection of glaucoma and other disorders of the eye. Is the patient up to date with their annual eye exam?  Yes  Who is the provider or what is the name of the office in which the patient attends annual eye exams? Dr.Ginkingack  If pt is not established with a provider, would they like to be referred to a provider to establish care? No .   Dental Screening: Recommended annual dental exams for proper oral hygiene   Community Resource Referral / Chronic Care Management: CRR required this visit?  No   CCM required this visit?  No     Plan:     I have personally reviewed and noted the following in the patient's chart:   Medical and social history Use of alcohol, tobacco or illicit drugs  Current medications and supplements including opioid prescriptions. Patient is not currently taking opioid prescriptions. Functional ability and  status Nutritional status  Physical activity Advanced directives List of other physicians Hospitalizations, surgeries, and ER visits in previous 12 months Vitals Screenings to include cognitive, depression, and falls Referrals and appointments  In addition, I have reviewed and discussed with patient certain preventive protocols, quality metrics, and best practice recommendations. A written personalized care plan for preventive services as well as general preventive health recommendations were provided to patient.     Lorrene Reid, LPN   2/84/1324   After Visit Summary: (MyChart) Due to this being a telephonic visit, the after visit summary with patients personalized plan was offered to patient via MyChart   Nurse Notes: none

## 2023-08-14 NOTE — Therapy (Signed)
OUTPATIENT PHYSICAL THERAPY LOWER EXTREMITY TREATMENT   Patient Name: Shelly Bennett MRN: [id number] DOB:12-30-1950, 72 y.o., female Today's Date: 08/14/2023  END OF SESSION:  PT End of Session - 08/14/23 1304     Visit Number 9    Number of Visits 17    Date for PT Re-Evaluation 09/06/23    Authorization Type [employer] MCR    Authorization Time Period 07/12/23 to 09/06/23    Progress Note Due on Visit 10    PT Start Time 1301    PT Stop Time 1344    PT Time Calculation (min) 43 min    Activity Tolerance Patient tolerated treatment well    Behavior During Therapy [facility] for tasks assessed/performed             Past Medical History:  Diagnosis Date   Adenomatous colon polyp    Allergic drug reaction 06/25/2015   Allergy    Anxiety    Arthritis    At risk for falls    fallen 4 times recently    CAD (coronary artery disease)    Dr. Excell Seltzer follows    Carpal tunnel syndrome    Chronic headaches    Coccydynia 02/01/2012   Cystocele    Diverticulosis    External hemorrhoids    Focal nodular hyperplasia of liver    Gait abnormality    GERD (gastroesophageal reflux disease)    some   HCAP (healthcare-associated pneumonia) 06/25/2015   Heart murmur    HLD (hyperlipidemia)    HTN (hypertension)    IBS (irritable bowel syndrome)    Memory loss    Neuromuscular disorder (HCC)    some neuropathy issues in the past- legs fibromyalgia in past-- past hx of steroid injections   Obesity    Osteoporosis    Otitis of left ear 02/01/2017   Pneumonia    Skin cancer    squamous cell - face   Past Surgical History:  Procedure Laterality Date   CARPAL TUNNEL RELEASE     COLONOSCOPY  12/15/2008   diverticulosis, external hemorrhoids   KNEE SURGERY  05/22/2015   right   LAPAROSCOPY     SKIN SURGERY     squamous cell - right face    Patient Active Problem List   Diagnosis Date Noted   Dementia (HCC) 05/02/2023   Frequent falls 03/12/2023   Parkinsonism 01/24/2023    Urinary incontinence 01/24/2023   Pain in right knee 12/05/2022   Pain in left knee 10/23/2022   Bilateral primary osteoarthritis of knee 10/04/2022   Gait abnormality 03/10/2020   Gastroesophageal reflux disease 10/19/2019   Third degree uterine prolapse 10/19/2019   Spinal stenosis of lumbar region without neurogenic claudication 09/15/2019   Urinary and fecal incontinence 09/15/2019   Cystocele with rectocele 09/15/2019   Low back pain with sciatica 07/22/2018   Left lumbar radiculopathy 01/22/2018   Cognitive impairment 10/22/2017   Anxiety 10/22/2017   Aortic atherosclerosis (HCC) 12/04/2016   Vitamin D deficiency 08/19/2014   Osteopenia 08/12/2013   CAD (coronary artery disease), native coronary artery 03/22/2013   Aortic valve stenosis, mild 03/22/2013   Hyperlipemia 03/11/2013   Hypertension 03/11/2013   Allergic rhinitis 03/11/2013   IBS (irritable bowel syndrome) - with chronic recurrent abdominal pain 02/01/2012   Personal history of colonic polyps - adenoma 02/01/2012   PCP: Arville Care MD   REFERRING PROVIDER: Levert Feinstein, MD  REFERRING DIAG: R41.89 (ICD-10-CM) - Cognitive impairment R32 (ICD-10-CM) - Urinary incontinence, unspecified type M54.40 (ICD-10-CM) -  Low back pain with sciatica, sciatica laterality unspecified, unspecified back pain laterality, unspecified chronicity R26.9 (ICD-10-CM) - Gait abnormality  THERAPY DIAG:  Unsteadiness on feet  Muscle weakness (generalized)  Other abnormalities of gait and mobility  History of falling  Rationale for Evaluation and Treatment: Rehabilitation  ONSET DATE: chronic   SUBJECTIVE:   SUBJECTIVE STATEMENT: Reports that ever since she woke up she has had difficulty in moving LLE. Now has a heart monitor.  PERTINENT HISTORY: Anxiety, hx falls, CAD, carpal tunnel, gait abnormality, HCAP, heart murmur, HLD, HTN, IBS, memory loss, neuromuscular disorder, knee surgery, Parkinsonism   PAIN:  Are you having  pain? Yes: NPRS scale: 3/10 Pain location: B knee    PRECAUTIONS: None  RED FLAGS: None   WEIGHT BEARING RESTRICTIONS: No  FALLS:  Has patient fallen in last 6 months? Yes. Number of falls 1 about a week ago   PLOF: Independent, Independent with household mobility with device, Independent with community mobility with device, and Requires assistive device for independence  PATIENT GOALS: be able to get legs working/be able to walk better   NEXT MD VISIT: October 10th with referring   OBJECTIVE:   PATIENT SURVEYS:  FOTO will take 2nd session   COGNITION: Overall cognitive status: Within functional limits for tasks assessed     SENSATION: L LE numbness all the time "feels like dragging a cinder block"   LOWER EXTREMITY MMT:  MMT Right eval Left eval  Hip flexion 3 3  Hip extension    Hip abduction 3 at best 3 at best   Hip adduction    Hip internal rotation    Hip external rotation    Knee flexion 4 4-  Knee extension 4+ 4  Ankle dorsiflexion 5 5  Ankle plantarflexion    Ankle inversion    Ankle eversion     (Blank rows = not tested)  FUNCTIONAL TESTS:  5 times sit to stand: 27 seconds BUEs on rollator posterior LOB with initial power up to stand  Timed up and go (TUG): 40 seconds with rollator   GAIT: Distance walked: in clinic distances  Assistive device utilized: Environmental consultant - 4 wheeled Level of assistance: Modified independence Comments: L LE with very short step lengths, tends to stick and freeze with poor progression, trunk flexed, limited stance time R LE, tends to push rollator too far ahead, some freezing and difficulty noted on turns   TODAY'S TREATMENT:                                                                                                                              DATE:   08/14/23 EXERCISE LOG  Exercise Repetitions and Resistance Comments  Nustep  L2 x 19 minutes    Seated heel/toe raises  4 min   LAQ X20 reps (3/10 B knee pain)   Seated  marching  3# x4 min Alternating LE   Ball squeeze  X2 min  Seated clam Red theraband x2 min    Blank cell = exercise not performed today   PATIENT EDUCATION:  Education details: exam findings, HEP, POC  Person educated: Patient Education method: Explanation, Demonstration, and Handouts Education comprehension: verbalized understanding, returned demonstration, and needs further education  HOME EXERCISE PROGRAM: Access Code: [email or web address] URL: [email or web address] Date: 07/12/2023 Prepared by: Nedra Hai  Exercises - Seated Hip Abduction with Resistance  - 2 x daily - 7 x weekly - 1 sets - 10 reps - 2 seconds hold - Seated March with Resistance  - 2 x daily - 7 x weekly - 1 sets - 10 reps - 2 seconds hold - Sit to Stand with Armchair  - 2 x daily - 7 x weekly - 1 sets - 10 reps  ASSESSMENT:  CLINICAL IMPRESSION: Patient presented in clinic with reports of difficulty with LLE since waking as well as B knee pain. Patient able to tolerate therex fair due to knee pain. Patient denies any recent falls.  OBJECTIVE IMPAIRMENTS: Abnormal gait, decreased activity tolerance, decreased balance, decreased coordination, decreased knowledge of use of DME, decreased mobility, difficulty walking, decreased strength, improper body mechanics, postural dysfunction, and pain.   ACTIVITY LIMITATIONS: bending, standing, squatting, stairs, transfers, and locomotion level  PARTICIPATION LIMITATIONS: driving, shopping, and community activity  PERSONAL FACTORS: Age, Behavior pattern, Fitness, Past/current experiences, and Time since onset of injury/illness/exacerbation are also affecting patient's functional outcome.   REHAB POTENTIAL: Fair chronicity of impairments   CLINICAL DECISION MAKING: Evolving/moderate complexity  EVALUATION COMPLEXITY: Moderate  GOALS: Goals reviewed with patient? Yes  SHORT TERM GOALS: Target date: 08/09/2023   Will be compliant with appropriate progressive  HEP  Baseline: Goal status: MET  2.  Step lengths/stance times to be equal B and will be able to ambulate more consistently with upright posture/appropriate distance from rollator  Baseline:  Goal status: ON GOING   3.  Will report no more than 1 fall in the past 4 weeks  Baseline:  Goal status: MET  4.  Will be able to complete TUG test in 20 seconds or less LRAD  Baseline: 40.11 seconds with Rolator  Goal status: ON GOING  LONG TERM GOALS: Target date: 09/06/2023   MMT to improve by at least 1 grade all weak groups  Baseline:  Goal status: ON GOING  2.  Will report no falls within the past 4 weeks  Baseline:  Goal status: MET  3.  Will score at least 48 on Berg balance test  Baseline:  Goal status: INITIAL  4.  Will be able to ambulate community distances with fatigue no more than 3/10, appropriate use of LRAD Baseline:  Goal status: ON GOING  5.  Will be compliant with appropriate long term exercise program (land based vs water based vs silver sneakers group class) to maintain functional gains and prevent return of functional impairments  Baseline:  Goal status: ON GOING  6.  Will be able to complete 5xSTS test in 15 seconds or less no posterior LOB  Baseline: 36.60 seconds with upper extremity support Goal status: ON GOING  PLAN:  PT FREQUENCY: 2x/week  PT DURATION: 8 weeks  PLANNED INTERVENTIONS: Therapeutic exercises, Therapeutic activity, Neuromuscular re-education, Gait training, Self Care, and Manual therapy  PLAN FOR NEXT SESSION: Nustep for reciprocal movement training, gait and balance training, strength, gait training outdoors as well. Try RW instead of 4WW and see if she's a bit safer with it?   Marvell Fuller, PTA 08/14/23 2:00 PM

## 2023-08-15 ENCOUNTER — Encounter: Payer: Self-pay | Admitting: Family

## 2023-08-15 ENCOUNTER — Ambulatory Visit (INDEPENDENT_AMBULATORY_CARE_PROVIDER_SITE_OTHER): Payer: Medicare PPO | Admitting: Family

## 2023-08-15 VITALS — BP 134/77 | HR 58 | Temp 97.8°F | Ht 67.0 in | Wt 190.0 lb

## 2023-08-15 DIAGNOSIS — R269 Unspecified abnormalities of gait and mobility: Secondary | ICD-10-CM

## 2023-08-15 DIAGNOSIS — R55 Syncope and collapse: Secondary | ICD-10-CM | POA: Diagnosis not present

## 2023-08-15 DIAGNOSIS — G309 Alzheimer's disease, unspecified: Secondary | ICD-10-CM | POA: Diagnosis not present

## 2023-08-15 DIAGNOSIS — Z09 Encounter for follow-up examination after completed treatment for conditions other than malignant neoplasm: Secondary | ICD-10-CM

## 2023-08-15 DIAGNOSIS — F02A0 Dementia in other diseases classified elsewhere, mild, without behavioral disturbance, psychotic disturbance, mood disturbance, and anxiety: Secondary | ICD-10-CM | POA: Diagnosis not present

## 2023-08-15 DIAGNOSIS — I10 Essential (primary) hypertension: Secondary | ICD-10-CM

## 2023-08-15 MED ORDER — HYDROCHLOROTHIAZIDE 25 MG PO TABS
25.0000 mg | ORAL_TABLET | Freq: Every day | ORAL | 1 refills | Status: DC
Start: 2023-08-15 — End: 2024-01-02

## 2023-08-15 NOTE — Patient Instructions (Signed)
Hypertension, Adult High blood pressure (hypertension) is when the force of blood pumping through the arteries is too strong. The arteries are the blood vessels that carry blood from the heart throughout the body. Hypertension forces the heart to work harder to pump blood and may cause arteries to become narrow or stiff. Untreated or uncontrolled hypertension can lead to a heart attack, heart failure, a stroke, kidney disease, and other problems. A blood pressure reading consists of a higher number over a lower number. Ideally, your blood pressure should be below 120/80. The first ("top") number is called the systolic pressure. It is a measure of the pressure in your arteries as your heart beats. The second ("bottom") number is called the diastolic pressure. It is a measure of the pressure in your arteries as the heart relaxes. What are the causes? The exact cause of this condition is not known. There are some conditions that result in high blood pressure. What increases the risk? Certain factors may make you more likely to develop high blood pressure. Some of these risk factors are under your control, including: Smoking. Not getting enough exercise or physical activity. Being overweight. Having too much fat, sugar, calories, or salt (sodium) in your diet. Drinking too much alcohol. Other risk factors include: Having a personal history of heart disease, diabetes, high cholesterol, or kidney disease. Stress. Having a family history of high blood pressure and high cholesterol. Having obstructive sleep apnea. Age. The risk increases with age. What are the signs or symptoms? High blood pressure may not cause symptoms. Very high blood pressure (hypertensive crisis) may cause: Headache. Fast or irregular heartbeats (palpitations). Shortness of breath. Nosebleed. Nausea and vomiting. Vision changes. Severe chest pain, dizziness, and seizures. How is this diagnosed? This condition is diagnosed by  measuring your blood pressure while you are seated, with your arm resting on a flat surface, your legs uncrossed, and your feet flat on the floor. The cuff of the blood pressure monitor will be placed directly against the skin of your upper arm at the level of your heart. Blood pressure should be measured at least twice using the same arm. Certain conditions can cause a difference in blood pressure between your right and left arms. If you have a high blood pressure reading during one visit or you have normal blood pressure with other risk factors, you may be asked to: Return on a different day to have your blood pressure checked again. Monitor your blood pressure at home for 1 week or longer. If you are diagnosed with hypertension, you may have other blood or imaging tests to help your health care provider understand your overall risk for other conditions. How is this treated? This condition is treated by making healthy lifestyle changes, such as eating healthy foods, exercising more, and reducing your alcohol intake. You may be referred for counseling on a healthy diet and physical activity. Your health care provider may prescribe medicine if lifestyle changes are not enough to get your blood pressure under control and if: Your systolic blood pressure is above 130. Your diastolic blood pressure is above 80. Your personal target blood pressure may vary depending on your medical conditions, your age, and other factors. Follow these instructions at home: Eating and drinking  Eat a diet that is high in fiber and potassium, and low in sodium, added sugar, and fat. An example of this eating plan is called the DASH diet. DASH stands for Dietary Approaches to Stop Hypertension. To eat this way: Eat   plenty of fresh fruits and vegetables. Try to fill one half of your plate at each meal with fruits and vegetables. Eat whole grains, such as whole-wheat pasta, brown rice, or whole-grain bread. Fill about one  fourth of your plate with whole grains. Eat or drink low-fat dairy products, such as skim milk or low-fat yogurt. Avoid fatty cuts of meat, processed or cured meats, and poultry with skin. Fill about one fourth of your plate with lean proteins, such as fish, chicken without skin, beans, eggs, or tofu. Avoid pre-made and processed foods. These tend to be higher in sodium, added sugar, and fat. Reduce your daily sodium intake. Many people with hypertension should eat less than 1,500 mg of sodium a day. Do not drink alcohol if: Your health care provider tells you not to drink. You are pregnant, may be pregnant, or are planning to become pregnant. If you drink alcohol: Limit how much you have to: 0-1 drink a day for women. 0-2 drinks a day for men. Know how much alcohol is in your drink. In the [place] one drink equals one 12 oz bottle of beer (355 mL), one 5 oz glass of wine (148 mL), or one 1 oz glass of hard liquor (44 mL). Lifestyle  Work with your health care provider to maintain a healthy body weight or to lose weight. Ask what an ideal weight is for you. Get at least 30 minutes of exercise that causes your heart to beat faster (aerobic exercise) most days of the week. Activities may include walking, swimming, or biking. Include exercise to strengthen your muscles (resistance exercise), such as Pilates or lifting weights, as part of your weekly exercise routine. Try to do these types of exercises for 30 minutes at least 3 days a week. Do not use any products that contain nicotine or tobacco. These products include cigarettes, chewing tobacco, and vaping devices, such as e-cigarettes. If you need help quitting, ask your health care provider. Monitor your blood pressure at home as told by your health care provider. Keep all follow-up visits. This is important. Medicines Take over-the-counter and prescription medicines only as told by your health care provider. Follow directions carefully. Blood  pressure medicines must be taken as prescribed. Do not skip doses of blood pressure medicine. Doing this puts you at risk for problems and can make the medicine less effective. Ask your health care provider about side effects or reactions to medicines that you should watch for. Contact a health care provider if you: Think you are having a reaction to a medicine you are taking. Have headaches that keep coming back (recurring). Feel dizzy. Have swelling in your ankles. Have trouble with your vision. Get help right away if you: Develop a severe headache or confusion. Have unusual weakness or numbness. Feel faint. Have severe pain in your chest or abdomen. Vomit repeatedly. Have trouble breathing. These symptoms may be an emergency. Get help right away. Call 911. Do not wait to see if the symptoms will go away. Do not drive yourself to the hospital. Summary Hypertension is when the force of blood pumping through your arteries is too strong. If this condition is not controlled, it may put you at risk for serious complications. Your personal target blood pressure may vary depending on your medical conditions, your age, and other factors. For most people, a normal blood pressure is less than 120/80. Hypertension is treated with lifestyle changes, medicines, or a combination of both. Lifestyle changes include losing weight, eating a healthy,   low-sodium diet, exercising more, and limiting alcohol. This information is not intended to replace advice given to you by your health care provider. Make sure you discuss any questions you have with your health care provider. Document Revised: 10/10/2021 Document Reviewed: 10/10/2021 Elsevier Patient Education  2024 Elsevier Inc.  

## 2023-08-15 NOTE — Progress Notes (Signed)
Subjective:    Patient ID: Shelly Bennett, female    DOB: 11-26-1951, 72 y.o.   MRN: [id number]  Chief Complaint  Patient presents with   Hospitalization Follow-up   Pt presents to the office today for hospital follow up. She went to the ED on 08/04/23 with a near syncopal episode and confusion. She had a negative CTA, head CT.   She is followed by Neurologists for unilateral weakness and shuffling gait and dementia and her next appointment is 09/26/23. She doing PT twice a week. She has an appointment with cognitive neurologists to establish care in January 2025. She is taking Namenda and Aricept.   Hypertension This is a chronic problem. The current episode started more than 1 year ago. The problem has been waxing and waning since onset. The problem is uncontrolled. Associated symptoms include malaise/fatigue. Pertinent negatives include no peripheral edema or shortness of breath. Risk factors for coronary artery disease include dyslipidemia and sedentary lifestyle. The current treatment provides mild improvement.      Review of Systems  Constitutional:  Positive for malaise/fatigue.  Respiratory:  Negative for shortness of breath.   All other systems reviewed and are negative.  Family History  Problem Relation Age of Onset   Hyperlipidemia Mother    Hypertension Mother    Kidney disease Mother    Osteoporosis Mother    Heart murmur Mother    Dementia Mother    Prostate cancer Father    Colon cancer Father 17   Kidney disease Father    Heart disease Father    Alzheimer's disease Father    Hyperlipidemia Sister    Hypertension Sister    Rectal cancer Maternal Grandmother 66   Heart disease Maternal Grandfather    Heart disease Brother 54       not dx questionable    Colon polyps Neg Hx    Esophageal cancer Neg Hx    Stomach cancer Neg Hx    Breast cancer Neg Hx    Social History   Socioeconomic History   Marital status: Married    Spouse name: Maisie Fus    Number of children: 4   Years of education: 16   Highest education level: Bachelor's degree (e.g., BA, AB, BS)  Occupational History   Occupation: Runner, broadcasting/film/video retired  Tobacco Use   Smoking status: Never   Smokeless tobacco: Never  Vaping Use   Vaping status: Never Used  Substance and Sexual Activity   Alcohol use: No   Drug use: No   Sexual activity: Not Currently    Birth control/protection: None  Other Topics Concern   Not on file  Social History Narrative   Lives at home with her husband.   Right-handed.   1 cup coffee each morning. 2-3 small soda cans each day.   Social Determinants of Health   Financial Resource Strain: Low Risk  (08/14/2023)   Overall Financial Resource Strain (CARDIA)    Difficulty of Paying Living Expenses: Not hard at all  Food Insecurity: No Food Insecurity (08/14/2023)   Hunger Vital Sign    Worried About Running Out of Food in the Last Year: Never true    Ran Out of Food in the Last Year: Never true  Transportation Needs: No Transportation Needs (08/14/2023)   PRAPARE - Administrator, Civil Service (Medical): No    Lack of Transportation (Non-Medical): No  Physical Activity: Insufficiently Active (08/14/2023)   Exercise Vital Sign    Days of Exercise per  Week: 3 days    Minutes of Exercise per Session: 30 min  Stress: No Stress Concern Present (08/14/2023)   [employer] of Occupational Health - Occupational Stress Questionnaire    Feeling of Stress : Not at all  Social Connections: Socially Integrated (08/14/2023)   Social Connection and Isolation Panel [NHANES]    Frequency of Communication with Friends and Family: More than three times a week    Frequency of Social Gatherings with Friends and Family: More than three times a week    Attends Religious Services: More than 4 times per year    Active Member of [employer] or Organizations: Yes    Attends Engineer, structural: More than 4 times per year    Marital Status: Married        Objective:   Physical Exam Vitals reviewed.  Constitutional:      General: She is not in acute distress.    Appearance: She is well-developed.  HENT:     Head: Normocephalic and atraumatic.     Right Ear: Tympanic membrane normal.     Left Ear: Tympanic membrane normal.  Eyes:     Pupils: Pupils are equal, round, and reactive to light.  Neck:     Thyroid: No thyromegaly.  Cardiovascular:     Rate and Rhythm: Normal rate and regular rhythm.     Heart sounds: Normal heart sounds. No murmur heard. Pulmonary:     Effort: Pulmonary effort is normal. No respiratory distress.     Breath sounds: Normal breath sounds. No wheezing.  Abdominal:     General: Bowel sounds are normal. There is no distension.     Palpations: Abdomen is soft.     Tenderness: There is no abdominal tenderness.  Musculoskeletal:        General: No tenderness. Normal range of motion.     Cervical back: Normal range of motion and neck supple.  Skin:    General: Skin is warm and dry.  Neurological:     Mental Status: She is alert and oriented to person, place, and time.     Cranial Nerves: No cranial nerve deficit.     Motor: Weakness (in wheelchair) present.     Gait: Gait abnormal.     Deep Tendon Reflexes: Reflexes are normal and symmetric.  Psychiatric:        Behavior: Behavior normal.        Thought Content: Thought content normal.        Judgment: Judgment normal.       BP 134/77   Pulse (!) 58   Temp 97.8 F (36.6 C) (Temporal)   Ht 5\' 7"  (1.702 m)   Wt 190 lb (86.2 kg)   SpO2 98%   BMI 29.76 kg/m      Assessment & Plan:   Cleon Menard comes in today with chief complaint of Hospitalization Follow-up   Diagnosis and orders addressed:  1. Mild Alzheimer's dementia, unspecified timing of dementia onset, unspecified whether behavioral, psychotic, or mood disturbance or anxiety (HCC) - CMP14+EGFR  2. Gait abnormality - CMP14+EGFR  3. Hypertension Continue  hydrochlorothiazide 25 mg -Daily blood pressure log given with instructions on how to fill out and told to bring to next visit -Dash diet information given -Exercise encouraged - Stress Management  -Continue current meds - CMP14+EGFR - hydrochlorothiazide (HYDRODIURIL) 25 MG tablet; Take 1 tablet (25 mg total) by mouth daily. TAKE 1/2 TABLET DAILY  Dispense: 90 tablet; Refill: 1  4. Hospital  discharge follow-up - CMP14+EGFR - hydrochlorothiazide (HYDRODIURIL) 25 MG tablet; Take 1 tablet (25 mg total) by mouth daily. TAKE 1/2 TABLET DAILY  Dispense: 90 tablet; Refill: 1  5. Near syncope - CMP14+EGFR   Labs pending Continue with PT Health Maintenance reviewed Diet and exercise encouraged  Follow up plan: Keep chronic follow up with PCP and Neurologists   Jannifer Rodney, FNP

## 2023-08-16 ENCOUNTER — Ambulatory Visit: Payer: Medicare PPO | Admitting: *Deleted

## 2023-08-16 ENCOUNTER — Telehealth (HOSPITAL_COMMUNITY): Payer: Self-pay | Admitting: Cardiovascular Disease

## 2023-08-16 DIAGNOSIS — Z9181 History of falling: Secondary | ICD-10-CM | POA: Diagnosis not present

## 2023-08-16 DIAGNOSIS — R2681 Unsteadiness on feet: Secondary | ICD-10-CM | POA: Diagnosis not present

## 2023-08-16 DIAGNOSIS — M6281 Muscle weakness (generalized): Secondary | ICD-10-CM

## 2023-08-16 DIAGNOSIS — R2689 Other abnormalities of gait and mobility: Secondary | ICD-10-CM

## 2023-08-16 LAB — CMP14+EGFR
ALT: 17 IU/L (ref 0–32)
AST: 18 IU/L (ref 0–40)
Albumin: 4.8 g/dL (ref 3.8–4.8)
Alkaline Phosphatase: 98 IU/L (ref 44–121)
BUN/Creatinine Ratio: 17 (ref 12–28)
BUN: 16 mg/dL (ref 8–27)
Bilirubin Total: 0.6 mg/dL (ref 0.0–1.2)
CO2: 25 mmol/L (ref 20–29)
Calcium: 10.6 mg/dL — ABNORMAL HIGH (ref 8.7–10.3)
Chloride: 101 mmol/L (ref 96–106)
Creatinine, Ser: 0.96 mg/dL (ref 0.57–1.00)
Globulin, Total: 1.9 g/dL (ref 1.5–4.5)
Glucose: 92 mg/dL (ref 70–99)
Potassium: 3.8 mmol/L (ref 3.5–5.2)
Sodium: 141 mmol/L (ref 134–144)
Total Protein: 6.7 g/dL (ref 6.0–8.5)
eGFR: 62 mL/min/{1.73_m2} (ref >59)

## 2023-08-16 NOTE — Therapy (Addendum)
OUTPATIENT PHYSICAL THERAPY LOWER EXTREMITY TREATMENT   Patient Name: Shandale Dirk MRN: [id number] DOB:06-08-1951, 72 y.o., female Today's Date: 08/16/2023  END OF SESSION:  PT End of Session - 08/16/23 1123     Visit Number 10    Number of Visits 17    Date for PT Re-Evaluation 09/06/23    Authorization Type [employer] MCR    Authorization Time Period 07/12/23 to 09/06/23    PT Start Time 1100    PT Stop Time 1150    PT Time Calculation (min) 50 min             Past Medical History:  Diagnosis Date   Adenomatous colon polyp    Allergic drug reaction 06/25/2015   Allergy    Anxiety    Arthritis    At risk for falls    fallen 4 times recently    CAD (coronary artery disease)    Dr. Excell Seltzer follows    Carpal tunnel syndrome    Chronic headaches    Coccydynia 02/01/2012   Cystocele    Diverticulosis    External hemorrhoids    Focal nodular hyperplasia of liver    Gait abnormality    GERD (gastroesophageal reflux disease)    some   HCAP (healthcare-associated pneumonia) 06/25/2015   Heart murmur    HLD (hyperlipidemia)    HTN (hypertension)    IBS (irritable bowel syndrome)    Memory loss    Neuromuscular disorder (HCC)    some neuropathy issues in the past- legs fibromyalgia in past-- past hx of steroid injections   Obesity    Osteoporosis    Otitis of left ear 02/01/2017   Pneumonia    Skin cancer    squamous cell - face   Past Surgical History:  Procedure Laterality Date   CARPAL TUNNEL RELEASE     COLONOSCOPY  12/15/2008   diverticulosis, external hemorrhoids   KNEE SURGERY  05/22/2015   right   LAPAROSCOPY     SKIN SURGERY     squamous cell - right face    Patient Active Problem List   Diagnosis Date Noted   Dementia (HCC) 05/02/2023   Frequent falls 03/12/2023   Parkinsonism 01/24/2023   Urinary incontinence 01/24/2023   Pain in right knee 12/05/2022   Pain in left knee 10/23/2022   Bilateral primary osteoarthritis of knee 10/04/2022    Gait abnormality 03/10/2020   Gastroesophageal reflux disease 10/19/2019   Third degree uterine prolapse 10/19/2019   Spinal stenosis of lumbar region without neurogenic claudication 09/15/2019   Urinary and fecal incontinence 09/15/2019   Cystocele with rectocele 09/15/2019   Low back pain with sciatica 07/22/2018   Left lumbar radiculopathy 01/22/2018   Cognitive impairment 10/22/2017   Anxiety 10/22/2017   Aortic atherosclerosis (HCC) 12/04/2016   Vitamin D deficiency 08/19/2014   Osteopenia 08/12/2013   CAD (coronary artery disease), native coronary artery 03/22/2013   Aortic valve stenosis, mild 03/22/2013   Hyperlipemia 03/11/2013   Hypertension 03/11/2013   Allergic rhinitis 03/11/2013   IBS (irritable bowel syndrome) - with chronic recurrent abdominal pain 02/01/2012   Personal history of colonic polyps - adenoma 02/01/2012   PCP: Arville Care MD   REFERRING PROVIDER: Levert Feinstein, MD  REFERRING DIAG: R41.89 (ICD-10-CM) - Cognitive impairment R32 (ICD-10-CM) - Urinary incontinence, unspecified type M54.40 (ICD-10-CM) - Low back pain with sciatica, sciatica laterality unspecified, unspecified back pain laterality, unspecified chronicity R26.9 (ICD-10-CM) - Gait abnormality  THERAPY DIAG:  Unsteadiness on feet  Muscle weakness (generalized)  Other abnormalities of gait and mobility  History of falling  Rationale for Evaluation and Treatment: Rehabilitation  ONSET DATE: chronic   SUBJECTIVE:   SUBJECTIVE STATEMENT: Pt arrived stating she feels good today  PERTINENT HISTORY: Anxiety, hx falls, CAD, carpal tunnel, gait abnormality, HCAP, heart murmur, HLD, HTN, IBS, memory loss, neuromuscular disorder, knee surgery, Parkinsonism   PAIN:  Are you having pain? Yes: NPRS scale: 3/10 Pain location: B knee    PRECAUTIONS: None  RED FLAGS: None   WEIGHT BEARING RESTRICTIONS: No  FALLS:  Has patient fallen in last 6 months? Yes. Number of falls 1 about a  week ago   PLOF: Independent, Independent with household mobility with device, Independent with community mobility with device, and Requires assistive device for independence  PATIENT GOALS: be able to get legs working/be able to walk better   NEXT MD VISIT: October 10th with referring   OBJECTIVE:   PATIENT SURVEYS:  FOTO will take 2nd session   COGNITION: Overall cognitive status: Within functional limits for tasks assessed     SENSATION: L LE numbness all the time "feels like dragging a cinder block"   LOWER EXTREMITY MMT:  MMT Right eval Left eval  Hip flexion 3 3  Hip extension    Hip abduction 3 at best 3 at best   Hip adduction    Hip internal rotation    Hip external rotation    Knee flexion 4 4-  Knee extension 4+ 4  Ankle dorsiflexion 5 5  Ankle plantarflexion    Ankle inversion    Ankle eversion     (Blank rows = not tested)  FUNCTIONAL TESTS:  5 times sit to stand: 27 seconds BUEs on rollator posterior LOB with initial power up to stand  Timed up and go (TUG): 40 seconds with rollator   GAIT: Distance walked: in clinic distances  Assistive device utilized: Environmental consultant - 4 wheeled Level of assistance: Modified independence Comments: L LE with very short step lengths, tends to stick and freeze with poor progression, trunk flexed, limited stance time R LE, tends to push rollator too far ahead, some freezing and difficulty noted on turns   TODAY'S TREATMENT:                                                                                                                              DATE:   08/16/23 EXERCISE LOG  Exercise Repetitions and Resistance Comments  Nustep  L2 x 20 minutes    Seated heel/toe raises  3 min   Seated dyna disc X 2 mins each side CW/ CCW circles   LAQ X20 reps  2#wt   (3/10 B knee pain)   Seated marching  2# 3x20 Alternating LE   Ball squeeze  X2 min   Seated clam Red theraband x2 min    Blank cell = exercise not performed today    PATIENT EDUCATION:  Education details:  exam findings, HEP, POC  Person educated: Patient Education method: Explanation, Demonstration, and Handouts Education comprehension: verbalized understanding, returned demonstration, and needs further education  HOME EXERCISE PROGRAM: Access Code: [email or web address] URL: [email or web address] Date: 07/12/2023 Prepared by: Nedra Hai  Exercises - Seated Hip Abduction with Resistance  - 2 x daily - 7 x weekly - 1 sets - 10 reps - 2 seconds hold - Seated March with Resistance  - 2 x daily - 7 x weekly - 1 sets - 10 reps - 2 seconds hold - Sit to Stand with Armchair  - 2 x daily - 7 x weekly - 1 sets - 10 reps  ASSESSMENT:  CLINICAL IMPRESSION: Patient arrived today doing better with energy and was able to continue with therex for LE strengthening as well as mm control and ankle proprioception exs. Dyna disc circles were added today and pt did fairlywell with ex on RT foot, but challenging on LT and needed tactile cueing. Mainly fatigued end of session.Pt unable to meet any new  Ltg's today.  08/16/23 PROGRESS REPORT:  Patient is making minimal progress with skilled physical therapy as evidenced by her functional mobility, progress toward her goals, and objective measures. She was unable to meet any of her long term goals for skilled physical therapy at this time. Recommend that she continue with her current plan of care to evaluate for the potential plateau in progress.   Candi Leash, PT, DPT   OBJECTIVE IMPAIRMENTS: Abnormal gait, decreased activity tolerance, decreased balance, decreased coordination, decreased knowledge of use of DME, decreased mobility, difficulty walking, decreased strength, improper body mechanics, postural dysfunction, and pain.   ACTIVITY LIMITATIONS: bending, standing, squatting, stairs, transfers, and locomotion level  PARTICIPATION LIMITATIONS: driving, shopping, and community activity  PERSONAL FACTORS:  Age, Behavior pattern, Fitness, Past/current experiences, and Time since onset of injury/illness/exacerbation are also affecting patient's functional outcome.   REHAB POTENTIAL: Fair chronicity of impairments   CLINICAL DECISION MAKING: Evolving/moderate complexity  EVALUATION COMPLEXITY: Moderate  GOALS: Goals reviewed with patient? Yes  SHORT TERM GOALS: Target date: 08/09/2023   Will be compliant with appropriate progressive HEP  Baseline: Goal status: MET  2.  Step lengths/stance times to be equal B and will be able to ambulate more consistently with upright posture/appropriate distance from rollator  Baseline:  Goal status: ON GOING   3.  Will report no more than 1 fall in the past 4 weeks  Baseline:  Goal status: MET  4.  Will be able to complete TUG test in 20 seconds or less LRAD  Baseline: 40.11 seconds with Rolator  Goal status: ON GOING  LONG TERM GOALS: Target date: 09/06/2023   MMT to improve by at least 1 grade all weak groups  Baseline:  Goal status: ON GOING  2.  Will report no falls within the past 4 weeks  Baseline:  Goal status: MET  3.  Will score at least 48 on Berg balance test  Baseline:  Goal status: On going  4.  Will be able to ambulate community distances with fatigue no more than 3/10, appropriate use of LRAD Baseline:  Goal status: ON GOING  5.  Will be compliant with appropriate long term exercise program (land based vs water based vs silver sneakers group class) to maintain functional gains and prevent return of functional impairments  Baseline:  Goal status: ON GOING  6.  Will be able to complete 5xSTS test in 15 seconds or less no posterior LOB  Baseline: 36.60 seconds with  upper extremity support Goal status: ON GOING  PLAN:  PT FREQUENCY: 2x/week  PT DURATION: 8 weeks  PLANNED INTERVENTIONS: Therapeutic exercises, Therapeutic activity, Neuromuscular re-education, Gait training, Self Care, and Manual therapy  PLAN FOR NEXT  SESSION: Nustep for reciprocal movement training, gait and balance training, strength, gait training outdoors as well. Try RW instead of 4WW and see if she's a bit safer with it?   Marvell Fuller, PTA 08/16/23 1:10 PM

## 2023-08-16 NOTE — Telephone Encounter (Signed)
Patients echocardiogram order was cancelled for reason below:  08/16/23 Spoke with daughter and she is a [employer] fundraiser and patient had echo at [facility] on an admit visit and its in Care Everywhere and doesnt need. Order will be removed from the echo WQ. Daughter state that Dr. Excell Seltzer is aware.

## 2023-08-21 ENCOUNTER — Ambulatory Visit: Payer: Medicare PPO | Attending: Neurology

## 2023-08-21 DIAGNOSIS — R2681 Unsteadiness on feet: Secondary | ICD-10-CM | POA: Insufficient documentation

## 2023-08-21 DIAGNOSIS — M6281 Muscle weakness (generalized): Secondary | ICD-10-CM | POA: Insufficient documentation

## 2023-08-21 DIAGNOSIS — R2689 Other abnormalities of gait and mobility: Secondary | ICD-10-CM | POA: Diagnosis not present

## 2023-08-21 DIAGNOSIS — Z9181 History of falling: Secondary | ICD-10-CM | POA: Insufficient documentation

## 2023-08-21 NOTE — Therapy (Signed)
OUTPATIENT PHYSICAL THERAPY LOWER EXTREMITY TREATMENT   Patient Name: Shelly Bennett MRN: [id number] DOB:10-26-1951, 72 y.o., female Today's Date: 08/21/2023  END OF SESSION:  PT End of Session - 08/21/23 1313     Visit Number 11    Number of Visits 17    Date for PT Re-Evaluation 09/06/23    Authorization Type [employer] MCR    Authorization Time Period 07/12/23 to 09/06/23    PT Start Time 1300    PT Stop Time 1345    PT Time Calculation (min) 45 min             Past Medical History:  Diagnosis Date   Adenomatous colon polyp    Allergic drug reaction 06/25/2015   Allergy    Anxiety    Arthritis    At risk for falls    fallen 4 times recently    CAD (coronary artery disease)    Dr. Excell Seltzer follows    Carpal tunnel syndrome    Chronic headaches    Coccydynia 02/01/2012   Cystocele    Diverticulosis    External hemorrhoids    Focal nodular hyperplasia of liver    Gait abnormality    GERD (gastroesophageal reflux disease)    some   HCAP (healthcare-associated pneumonia) 06/25/2015   Heart murmur    HLD (hyperlipidemia)    HTN (hypertension)    IBS (irritable bowel syndrome)    Memory loss    Neuromuscular disorder (HCC)    some neuropathy issues in the past- legs fibromyalgia in past-- past hx of steroid injections   Obesity    Osteoporosis    Otitis of left ear 02/01/2017   Pneumonia    Skin cancer    squamous cell - face   Past Surgical History:  Procedure Laterality Date   CARPAL TUNNEL RELEASE     COLONOSCOPY  12/15/2008   diverticulosis, external hemorrhoids   KNEE SURGERY  05/22/2015   right   LAPAROSCOPY     SKIN SURGERY     squamous cell - right face    Patient Active Problem List   Diagnosis Date Noted   Dementia (HCC) 05/02/2023   Frequent falls 03/12/2023   Parkinsonism 01/24/2023   Urinary incontinence 01/24/2023   Pain in right knee 12/05/2022   Pain in left knee 10/23/2022   Bilateral primary osteoarthritis of knee 10/04/2022    Gait abnormality 03/10/2020   Gastroesophageal reflux disease 10/19/2019   Third degree uterine prolapse 10/19/2019   Spinal stenosis of lumbar region without neurogenic claudication 09/15/2019   Urinary and fecal incontinence 09/15/2019   Cystocele with rectocele 09/15/2019   Low back pain with sciatica 07/22/2018   Left lumbar radiculopathy 01/22/2018   Cognitive impairment 10/22/2017   Anxiety 10/22/2017   Aortic atherosclerosis (HCC) 12/04/2016   Vitamin D deficiency 08/19/2014   Osteopenia 08/12/2013   CAD (coronary artery disease), native coronary artery 03/22/2013   Aortic valve stenosis, mild 03/22/2013   Hyperlipemia 03/11/2013   Hypertension 03/11/2013   Allergic rhinitis 03/11/2013   IBS (irritable bowel syndrome) - with chronic recurrent abdominal pain 02/01/2012   Personal history of colonic polyps - adenoma 02/01/2012   PCP: Arville Care MD   REFERRING PROVIDER: Levert Feinstein, MD  REFERRING DIAG: R41.89 (ICD-10-CM) - Cognitive impairment R32 (ICD-10-CM) - Urinary incontinence, unspecified type M54.40 (ICD-10-CM) - Low back pain with sciatica, sciatica laterality unspecified, unspecified back pain laterality, unspecified chronicity R26.9 (ICD-10-CM) - Gait abnormality  THERAPY DIAG:  Unsteadiness on feet  Muscle weakness (generalized)  Other abnormalities of gait and mobility  History of falling  Rationale for Evaluation and Treatment: Rehabilitation  ONSET DATE: chronic   SUBJECTIVE:   SUBJECTIVE STATEMENT: Pt reports 4/10 left knee pain today.  Pt reports increased fatigue today.   PERTINENT HISTORY: Anxiety, hx falls, CAD, carpal tunnel, gait abnormality, HCAP, heart murmur, HLD, HTN, IBS, memory loss, neuromuscular disorder, knee surgery, Parkinsonism   PAIN:  Are you having pain? Yes: NPRS scale: 4/10 Pain location: B knee    PRECAUTIONS: None  RED FLAGS: None   WEIGHT BEARING RESTRICTIONS: No  FALLS:  Has patient fallen in last 6  months? Yes. Number of falls 1 about a week ago   PLOF: Independent, Independent with household mobility with device, Independent with community mobility with device, and Requires assistive device for independence  PATIENT GOALS: be able to get legs working/be able to walk better   NEXT MD VISIT: October 10th with referring   OBJECTIVE:   PATIENT SURVEYS:  FOTO will take 2nd session   COGNITION: Overall cognitive status: Within functional limits for tasks assessed     SENSATION: L LE numbness all the time "feels like dragging a cinder block"   LOWER EXTREMITY MMT:  MMT Right eval Left eval  Hip flexion 3 3  Hip extension    Hip abduction 3 at best 3 at best   Hip adduction    Hip internal rotation    Hip external rotation    Knee flexion 4 4-  Knee extension 4+ 4  Ankle dorsiflexion 5 5  Ankle plantarflexion    Ankle inversion    Ankle eversion     (Blank rows = not tested)  FUNCTIONAL TESTS:  5 times sit to stand: 27 seconds BUEs on rollator posterior LOB with initial power up to stand  Timed up and go (TUG): 40 seconds with rollator   GAIT: Distance walked: in clinic distances  Assistive device utilized: Environmental consultant - 4 wheeled Level of assistance: Modified independence Comments: L LE with very short step lengths, tends to stick and freeze with poor progression, trunk flexed, limited stance time R LE, tends to push rollator too far ahead, some freezing and difficulty noted on turns   TODAY'S TREATMENT:                                                                                                                              DATE:   08/21/23 EXERCISE LOG  Exercise Repetitions and Resistance Comments  Nustep  L2 x 20 minutes    Seated heel/toe raises  3 min   Seated dyna disc    LAQ 2# x 25 reps bil  (3/10 B knee pain)   Seated marching  2# 3x20 Alternating LE   Ball squeeze  X2 min   Seated clam Red theraband x2 min    Blank cell = exercise not performed today    PATIENT EDUCATION:  Education details:  exam findings, HEP, POC  Person educated: Patient Education method: Explanation, Demonstration, and Handouts Education comprehension: verbalized understanding, returned demonstration, and needs further education  HOME EXERCISE PROGRAM: Access Code: [email or web address] URL: [email or web address] Date: 07/12/2023 Prepared by: Nedra Hai  Exercises - Seated Hip Abduction with Resistance  - 2 x daily - 7 x weekly - 1 sets - 10 reps - 2 seconds hold - Seated March with Resistance  - 2 x daily - 7 x weekly - 1 sets - 10 reps - 2 seconds hold - Sit to Stand with Armchair  - 2 x daily - 7 x weekly - 1 sets - 10 reps  ASSESSMENT:  CLINICAL IMPRESSION: Pt arrives for today's treatment session reporting 4/10 left knee pain.  Pt demonstrates increased difficulty with left foot clearance today, with tendency to "drag" left foot forward.  Pt able to tolerate increased reps or time with all exercises today.  Pt encouraged to concentrate on foot clearance and safety with ambulation.  Pt denied any change in pain at completion of today's treatment session.  OBJECTIVE IMPAIRMENTS: Abnormal gait, decreased activity tolerance, decreased balance, decreased coordination, decreased knowledge of use of DME, decreased mobility, difficulty walking, decreased strength, improper body mechanics, postural dysfunction, and pain.   ACTIVITY LIMITATIONS: bending, standing, squatting, stairs, transfers, and locomotion level  PARTICIPATION LIMITATIONS: driving, shopping, and community activity  PERSONAL FACTORS: Age, Behavior pattern, Fitness, Past/current experiences, and Time since onset of injury/illness/exacerbation are also affecting patient's functional outcome.   REHAB POTENTIAL: Fair chronicity of impairments   CLINICAL DECISION MAKING: Evolving/moderate complexity  EVALUATION COMPLEXITY: Moderate  GOALS: Goals reviewed with patient? Yes  SHORT TERM GOALS:  Target date: 08/09/2023   Will be compliant with appropriate progressive HEP  Baseline: Goal status: MET  2.  Step lengths/stance times to be equal B and will be able to ambulate more consistently with upright posture/appropriate distance from rollator  Baseline:  Goal status: ON GOING   3.  Will report no more than 1 fall in the past 4 weeks  Baseline:  Goal status: MET  4.  Will be able to complete TUG test in 20 seconds or less LRAD  Baseline: 40.11 seconds with Rolator  Goal status: ON GOING  LONG TERM GOALS: Target date: 09/06/2023   MMT to improve by at least 1 grade all weak groups  Baseline:  Goal status: ON GOING  2.  Will report no falls within the past 4 weeks  Baseline:  Goal status: MET  3.  Will score at least 48 on Berg balance test  Baseline:  Goal status: On going  4.  Will be able to ambulate community distances with fatigue no more than 3/10, appropriate use of LRAD Baseline:  Goal status: ON GOING  5.  Will be compliant with appropriate long term exercise program (land based vs water based vs silver sneakers group class) to maintain functional gains and prevent return of functional impairments  Baseline:  Goal status: ON GOING  6.  Will be able to complete 5xSTS test in 15 seconds or less no posterior LOB  Baseline: 36.60 seconds with upper extremity support Goal status: ON GOING  PLAN:  PT FREQUENCY: 2x/week  PT DURATION: 8 weeks  PLANNED INTERVENTIONS: Therapeutic exercises, Therapeutic activity, Neuromuscular re-education, Gait training, Self Care, and Manual therapy  PLAN FOR NEXT SESSION: Nustep for reciprocal movement training, gait and balance training, strength, gait training outdoors as well. Try RW instead of 4WW and see if she's a bit safer  with it?   Rexene Agent, PTA 08/21/23 2:04 PM

## 2023-08-23 ENCOUNTER — Encounter: Payer: Medicare PPO | Admitting: Physical Therapy

## 2023-08-23 DIAGNOSIS — G458 Other transient cerebral ischemic attacks and related syndromes: Secondary | ICD-10-CM | POA: Diagnosis not present

## 2023-08-26 ENCOUNTER — Inpatient Hospital Stay: Admission: RE | Admit: 2023-08-26 | Payer: Medicare PPO | Source: Ambulatory Visit

## 2023-08-26 DIAGNOSIS — I471 Supraventricular tachycardia, unspecified: Secondary | ICD-10-CM | POA: Diagnosis not present

## 2023-08-26 DIAGNOSIS — I4729 Other ventricular tachycardia: Secondary | ICD-10-CM | POA: Diagnosis not present

## 2023-08-28 ENCOUNTER — Ambulatory Visit: Payer: Medicare PPO

## 2023-08-28 DIAGNOSIS — R2681 Unsteadiness on feet: Secondary | ICD-10-CM | POA: Diagnosis not present

## 2023-08-28 DIAGNOSIS — Z9181 History of falling: Secondary | ICD-10-CM

## 2023-08-28 DIAGNOSIS — R2689 Other abnormalities of gait and mobility: Secondary | ICD-10-CM | POA: Diagnosis not present

## 2023-08-28 DIAGNOSIS — M6281 Muscle weakness (generalized): Secondary | ICD-10-CM

## 2023-08-28 NOTE — Therapy (Signed)
OUTPATIENT PHYSICAL THERAPY LOWER EXTREMITY TREATMENT   Patient Name: Shelly Bennett MRN: [id number] DOB:01-19-1951, 72 y.o., female Today's Date: 08/28/2023  END OF SESSION:  PT End of Session - 08/28/23 1108     Visit Number 12    Number of Visits 17    Date for PT Re-Evaluation 09/06/23    Authorization Type [employer] MCR    Authorization Time Period 07/12/23 to 09/06/23    PT Start Time 1100    PT Stop Time 1145    PT Time Calculation (min) 45 min    Activity Tolerance Patient tolerated treatment well    Behavior During Therapy [facility] for tasks assessed/performed             Past Medical History:  Diagnosis Date   Adenomatous colon polyp    Allergic drug reaction 06/25/2015   Allergy    Anxiety    Arthritis    At risk for falls    fallen 4 times recently    CAD (coronary artery disease)    Dr. Excell Seltzer follows    Carpal tunnel syndrome    Chronic headaches    Coccydynia 02/01/2012   Cystocele    Diverticulosis    External hemorrhoids    Focal nodular hyperplasia of liver    Gait abnormality    GERD (gastroesophageal reflux disease)    some   HCAP (healthcare-associated pneumonia) 06/25/2015   Heart murmur    HLD (hyperlipidemia)    HTN (hypertension)    IBS (irritable bowel syndrome)    Memory loss    Neuromuscular disorder (HCC)    some neuropathy issues in the past- legs fibromyalgia in past-- past hx of steroid injections   Obesity    Osteoporosis    Otitis of left ear 02/01/2017   Pneumonia    Skin cancer    squamous cell - face   Past Surgical History:  Procedure Laterality Date   CARPAL TUNNEL RELEASE     COLONOSCOPY  12/15/2008   diverticulosis, external hemorrhoids   KNEE SURGERY  05/22/2015   right   LAPAROSCOPY     SKIN SURGERY     squamous cell - right face    Patient Active Problem List   Diagnosis Date Noted   Dementia (HCC) 05/02/2023   Frequent falls 03/12/2023   Parkinsonism 01/24/2023   Urinary incontinence 01/24/2023   Pain  in right knee 12/05/2022   Pain in left knee 10/23/2022   Bilateral primary osteoarthritis of knee 10/04/2022   Gait abnormality 03/10/2020   Gastroesophageal reflux disease 10/19/2019   Third degree uterine prolapse 10/19/2019   Spinal stenosis of lumbar region without neurogenic claudication 09/15/2019   Urinary and fecal incontinence 09/15/2019   Cystocele with rectocele 09/15/2019   Low back pain with sciatica 07/22/2018   Left lumbar radiculopathy 01/22/2018   Cognitive impairment 10/22/2017   Anxiety 10/22/2017   Aortic atherosclerosis (HCC) 12/04/2016   Vitamin D deficiency 08/19/2014   Osteopenia 08/12/2013   CAD (coronary artery disease), native coronary artery 03/22/2013   Aortic valve stenosis, mild 03/22/2013   Hyperlipemia 03/11/2013   Hypertension 03/11/2013   Allergic rhinitis 03/11/2013   IBS (irritable bowel syndrome) - with chronic recurrent abdominal pain 02/01/2012   Personal history of colonic polyps - adenoma 02/01/2012   PCP: Arville Care MD   REFERRING PROVIDER: Levert Feinstein, MD  REFERRING DIAG: R41.89 (ICD-10-CM) - Cognitive impairment R32 (ICD-10-CM) - Urinary incontinence, unspecified type M54.40 (ICD-10-CM) - Low back pain with sciatica, sciatica laterality unspecified,  unspecified back pain laterality, unspecified chronicity R26.9 (ICD-10-CM) - Gait abnormality  THERAPY DIAG:  Unsteadiness on feet  Muscle weakness (generalized)  Other abnormalities of gait and mobility  History of falling  Rationale for Evaluation and Treatment: Rehabilitation  ONSET DATE: chronic   SUBJECTIVE:   SUBJECTIVE STATEMENT: Pt reports 4/10 left knee pain today.  Pt reports increased difficulty with walking today.   PERTINENT HISTORY: Anxiety, hx falls, CAD, carpal tunnel, gait abnormality, HCAP, heart murmur, HLD, HTN, IBS, memory loss, neuromuscular disorder, knee surgery, Parkinsonism   PAIN:  Are you having pain? Yes: NPRS scale: 4/10 Pain location: B  knee    PRECAUTIONS: None  RED FLAGS: None   WEIGHT BEARING RESTRICTIONS: No  FALLS:  Has patient fallen in last 6 months? Yes. Number of falls 1 about a week ago   PLOF: Independent, Independent with household mobility with device, Independent with community mobility with device, and Requires assistive device for independence  PATIENT GOALS: be able to get legs working/be able to walk better   NEXT MD VISIT: October 10th with referring   OBJECTIVE:   PATIENT SURVEYS:  FOTO will take 2nd session   COGNITION: Overall cognitive status: Within functional limits for tasks assessed     SENSATION: L LE numbness all the time "feels like dragging a cinder block"   LOWER EXTREMITY MMT:  MMT Right eval Left eval  Hip flexion 3 3  Hip extension    Hip abduction 3 at best 3 at best   Hip adduction    Hip internal rotation    Hip external rotation    Knee flexion 4 4-  Knee extension 4+ 4  Ankle dorsiflexion 5 5  Ankle plantarflexion    Ankle inversion    Ankle eversion     (Blank rows = not tested)  FUNCTIONAL TESTS:  5 times sit to stand: 27 seconds BUEs on rollator posterior LOB with initial power up to stand  Timed up and go (TUG): 40 seconds with rollator   GAIT: Distance walked: in clinic distances  Assistive device utilized: Environmental consultant - 4 wheeled Level of assistance: Modified independence Comments: L LE with very short step lengths, tends to stick and freeze with poor progression, trunk flexed, limited stance time R LE, tends to push rollator too far ahead, some freezing and difficulty noted on turns   TODAY'S TREATMENT:                                                                                                                              DATE:   08/28/23 EXERCISE LOG  Exercise Repetitions and Resistance Comments  Nustep  L2 x 20 minutes    Seated heel/toe raises  3 min   Seated dyna disc    LAQ 2# x 25 reps bil  (3/10 B knee pain)   Seated marching  2#  3x20 Alternating LE   Ball squeeze  X2 min   Seated clam  Red theraband x2 min    Blank cell = exercise not performed today   PATIENT EDUCATION:  Education details: exam findings, HEP, POC  Person educated: Patient Education method: Explanation, Demonstration, and Handouts Education comprehension: verbalized understanding, returned demonstration, and needs further education  HOME EXERCISE PROGRAM: Access Code: [email or web address] URL: [email or web address] Date: 07/12/2023 Prepared by: Nedra Hai  Exercises - Seated Hip Abduction with Resistance  - 2 x daily - 7 x weekly - 1 sets - 10 reps - 2 seconds hold - Seated March with Resistance  - 2 x daily - 7 x weekly - 1 sets - 10 reps - 2 seconds hold - Sit to Stand with Armchair  - 2 x daily - 7 x weekly - 1 sets - 10 reps  ASSESSMENT:  CLINICAL IMPRESSION: Pt arrives for today's treatment session denying any pain, but pt reports increased difficulty with lifting and advancing left foot today.  Pt exhibiting decreased foot clearance of left foot with all ambulation today.  Pt states that left foot "feels like it is tied to a cinder block."  Pt also states that she has good day and bad days and today, must be a bad day, but seems that the bad days are starting to out number the good ones.    OBJECTIVE IMPAIRMENTS: Abnormal gait, decreased activity tolerance, decreased balance, decreased coordination, decreased knowledge of use of DME, decreased mobility, difficulty walking, decreased strength, improper body mechanics, postural dysfunction, and pain.   ACTIVITY LIMITATIONS: bending, standing, squatting, stairs, transfers, and locomotion level  PARTICIPATION LIMITATIONS: driving, shopping, and community activity  PERSONAL FACTORS: Age, Behavior pattern, Fitness, Past/current experiences, and Time since onset of injury/illness/exacerbation are also affecting patient's functional outcome.   REHAB POTENTIAL: Fair chronicity of  impairments   CLINICAL DECISION MAKING: Evolving/moderate complexity  EVALUATION COMPLEXITY: Moderate  GOALS: Goals reviewed with patient? Yes  SHORT TERM GOALS: Target date: 08/09/2023   Will be compliant with appropriate progressive HEP  Baseline: Goal status: MET  2.  Step lengths/stance times to be equal B and will be able to ambulate more consistently with upright posture/appropriate distance from rollator  Baseline:  Goal status: ON GOING   3.  Will report no more than 1 fall in the past 4 weeks  Baseline:  Goal status: MET  4.  Will be able to complete TUG test in 20 seconds or less LRAD  Baseline: 40.11 seconds with Rolator  Goal status: ON GOING  LONG TERM GOALS: Target date: 09/06/2023   MMT to improve by at least 1 grade all weak groups  Baseline:  Goal status: ON GOING  2.  Will report no falls within the past 4 weeks  Baseline:  Goal status: MET  3.  Will score at least 48 on Berg balance test  Baseline:  Goal status: On going  4.  Will be able to ambulate community distances with fatigue no more than 3/10, appropriate use of LRAD Baseline:  Goal status: ON GOING  5.  Will be compliant with appropriate long term exercise program (land based vs water based vs silver sneakers group class) to maintain functional gains and prevent return of functional impairments  Baseline:  Goal status: ON GOING  6.  Will be able to complete 5xSTS test in 15 seconds or less no posterior LOB  Baseline: 36.60 seconds with upper extremity support Goal status: ON GOING  PLAN:  PT FREQUENCY: 2x/week  PT DURATION: 8 weeks  PLANNED INTERVENTIONS: Therapeutic exercises, Therapeutic activity, Neuromuscular re-education,  Gait training, Self Care, and Manual therapy  PLAN FOR NEXT SESSION: Nustep for reciprocal movement training, gait and balance training, strength, gait training outdoors as well. Try RW instead of 4WW and see if she's a bit safer with it?   Rexene Agent, PTA 08/28/23 1:07 PM

## 2023-08-30 ENCOUNTER — Ambulatory Visit: Payer: Medicare PPO

## 2023-08-30 DIAGNOSIS — Z9181 History of falling: Secondary | ICD-10-CM

## 2023-08-30 DIAGNOSIS — M6281 Muscle weakness (generalized): Secondary | ICD-10-CM

## 2023-08-30 DIAGNOSIS — R2681 Unsteadiness on feet: Secondary | ICD-10-CM | POA: Diagnosis not present

## 2023-08-30 DIAGNOSIS — R2689 Other abnormalities of gait and mobility: Secondary | ICD-10-CM

## 2023-08-30 NOTE — Therapy (Addendum)
OUTPATIENT PHYSICAL THERAPY LOWER EXTREMITY TREATMENT   Patient Name: Shelly Bennett MRN: [id number] DOB:[birth year], 72 y.o., female Today's Date: 08/30/2023  END OF SESSION:  PT End of Session - 08/30/23 1116     Visit Number 13    Number of Visits 17    Date for PT Re-Evaluation 09/06/23    Authorization Type [employer] MCR    Authorization Time Period 07/12/23 to 09/06/23    Activity Tolerance Patient tolerated treatment well    Behavior During Therapy [facility] for tasks assessed/performed             Past Medical History:  Diagnosis Date   Adenomatous colon polyp    Allergic drug reaction 06/25/2015   Allergy    Anxiety    Arthritis    At risk for falls    fallen 4 times recently    CAD (coronary artery disease)    Dr. Excell Seltzer follows    Carpal tunnel syndrome    Chronic headaches    Coccydynia 02/01/2012   Cystocele    Diverticulosis    External hemorrhoids    Focal nodular hyperplasia of liver    Gait abnormality    GERD (gastroesophageal reflux disease)    some   HCAP (healthcare-associated pneumonia) 06/25/2015   Heart murmur    HLD (hyperlipidemia)    HTN (hypertension)    IBS (irritable bowel syndrome)    Memory loss    Neuromuscular disorder (HCC)    some neuropathy issues in the past- legs fibromyalgia in past-- past hx of steroid injections   Obesity    Osteoporosis    Otitis of left ear 02/01/2017   Pneumonia    Skin cancer    squamous cell - face   Past Surgical History:  Procedure Laterality Date   CARPAL TUNNEL RELEASE     COLONOSCOPY  12/15/2008   diverticulosis, external hemorrhoids   KNEE SURGERY  05/22/2015   right   LAPAROSCOPY     SKIN SURGERY     squamous cell - right face    Patient Active Problem List   Diagnosis Date Noted   Dementia (HCC) 05/02/2023   Frequent falls 03/12/2023   Parkinsonism 01/24/2023   Urinary incontinence 01/24/2023   Pain in right knee 12/05/2022   Pain in left knee 10/23/2022   Bilateral primary  osteoarthritis of knee 10/04/2022   Gait abnormality 03/10/2020   Gastroesophageal reflux disease 10/19/2019   Third degree uterine prolapse 10/19/2019   Spinal stenosis of lumbar region without neurogenic claudication 09/15/2019   Urinary and fecal incontinence 09/15/2019   Cystocele with rectocele 09/15/2019   Low back pain with sciatica 07/22/2018   Left lumbar radiculopathy 01/22/2018   Cognitive impairment 10/22/2017   Anxiety 10/22/2017   Aortic atherosclerosis (HCC) 12/04/2016   Vitamin D deficiency 08/19/2014   Osteopenia 08/12/2013   CAD (coronary artery disease), native coronary artery 03/22/2013   Aortic valve stenosis, mild 03/22/2013   Hyperlipemia 03/11/2013   Hypertension 03/11/2013   Allergic rhinitis 03/11/2013   IBS (irritable bowel syndrome) - with chronic recurrent abdominal pain 02/01/2012   Personal history of colonic polyps - adenoma 02/01/2012   PCP: Arville Care MD   REFERRING PROVIDER: Levert Feinstein, MD  REFERRING DIAG: R41.89 (ICD-10-CM) - Cognitive impairment R32 (ICD-10-CM) - Urinary incontinence, unspecified type M54.40 (ICD-10-CM) - Low back pain with sciatica, sciatica laterality unspecified, unspecified back pain laterality, unspecified chronicity R26.9 (ICD-10-CM) - Gait abnormality  THERAPY DIAG:  Unsteadiness on feet  Muscle weakness (generalized)  Other abnormalities of gait and mobility  History of falling  Rationale for Evaluation and Treatment: Rehabilitation  ONSET DATE: chronic   SUBJECTIVE:   SUBJECTIVE STATEMENT: Pt reports 4/10 left knee pain today.  Pt reports increased difficulty with walking today.  Pt states feels as if left foot is in cement.    PERTINENT HISTORY: Anxiety, hx falls, CAD, carpal tunnel, gait abnormality, HCAP, heart murmur, HLD, HTN, IBS, memory loss, neuromuscular disorder, knee surgery, Parkinsonism   PAIN:  Are you having pain? Yes: NPRS scale: 4/10 Pain location: left knee    PRECAUTIONS:  None  RED FLAGS: None   WEIGHT BEARING RESTRICTIONS: No  FALLS:  Has patient fallen in last 6 months? Yes. Number of falls 1 about a week ago   PLOF: Independent, Independent with household mobility with device, Independent with community mobility with device, and Requires assistive device for independence  PATIENT GOALS: be able to get legs working/be able to walk better   NEXT MD VISIT: October 10th with referring   OBJECTIVE:   PATIENT SURVEYS:  FOTO will take 2nd session   COGNITION: Overall cognitive status: Within functional limits for tasks assessed     SENSATION: L LE numbness all the time "feels like dragging a cinder block"   LOWER EXTREMITY MMT:  MMT Right eval Left eval  Hip flexion 3 3  Hip extension    Hip abduction 3 at best 3 at best   Hip adduction    Hip internal rotation    Hip external rotation    Knee flexion 4 4-  Knee extension 4+ 4  Ankle dorsiflexion 5 5  Ankle plantarflexion    Ankle inversion    Ankle eversion     (Blank rows = not tested)  FUNCTIONAL TESTS:  5 times sit to stand: 27 seconds BUEs on rollator posterior LOB with initial power up to stand  Timed up and go (TUG): 40 seconds with rollator   GAIT: Distance walked: in clinic distances  Assistive device utilized: Environmental consultant - 4 wheeled Level of assistance: Modified independence Comments: L LE with very short step lengths, tends to stick and freeze with poor progression, trunk flexed, limited stance time R LE, tends to push rollator too far ahead, some freezing and difficulty noted on turns   TODAY'S TREATMENT:                                                                                                                              DATE:   08/30/23 EXERCISE LOG  Exercise Repetitions and Resistance Comments  Nustep  L2 x 20 minutes    Seated heel/toe raises  3 min   Seated dyna disc    LAQ 2# x 25 reps bil  (3/10 B knee pain)   Seated marching  2# 3x20 Alternating LE    Ball squeeze  X2 min   Seated clam Red theraband x2 min    Blank cell = exercise  not performed today   PATIENT EDUCATION:  Education details: exam findings, HEP, POC  Person educated: Patient Education method: Explanation, Demonstration, and Handouts Education comprehension: verbalized understanding, returned demonstration, and needs further education  HOME EXERCISE PROGRAM: Access Code: [email or web address] URL: [email or web address] Date: 07/12/2023 Prepared by: Nedra Hai  Exercises - Seated Hip Abduction with Resistance  - 2 x daily - 7 x weekly - 1 sets - 10 reps - 2 seconds hold - Seated March with Resistance  - 2 x daily - 7 x weekly - 1 sets - 10 reps - 2 seconds hold - Sit to Stand with Armchair  - 2 x daily - 7 x weekly - 1 sets - 10 reps  ASSESSMENT:  CLINICAL IMPRESSION: Pt arrives for today's treatment session reporting 4/10 left knee pain.  Pt reports that her left foot "feels like its in cement."  Pt able to perform TUG in 34 seconds and 5 STS in 21.7 seconds.  Pt has made progress towards both of her goals, but has not met either one of them at this time.  Pt demonstrates increased MMT scores in all test muscle groups, but continues to demonstrate limitation and deficits in all muscle groups.  Pt reported 4/10 left knee pain at completion of today's treatment session.   PHYSICAL THERAPY DISCHARGE SUMMARY  Visits from Start of Care: 13  Current functional level related to goals / functional outcomes: Patient was able to make fair progress with skilled physical therapy. However, she was unable to meet all of her short or long term goals.    Remaining deficits: Gait deviations along with lower extremity power and strength   Education / Equipment: HEP    Patient agrees to discharge. Patient goals were partially met. Patient is being discharged due to maximized rehab potential.   Candi Leash, PT, DPT    OBJECTIVE IMPAIRMENTS: Abnormal gait, decreased  activity tolerance, decreased balance, decreased coordination, decreased knowledge of use of DME, decreased mobility, difficulty walking, decreased strength, improper body mechanics, postural dysfunction, and pain.   ACTIVITY LIMITATIONS: bending, standing, squatting, stairs, transfers, and locomotion level  PARTICIPATION LIMITATIONS: driving, shopping, and community activity  PERSONAL FACTORS: Age, Behavior pattern, Fitness, Past/current experiences, and Time since onset of injury/illness/exacerbation are also affecting patient's functional outcome.   REHAB POTENTIAL: Fair chronicity of impairments   CLINICAL DECISION MAKING: Evolving/moderate complexity  EVALUATION COMPLEXITY: Moderate  GOALS: Goals reviewed with patient? Yes  SHORT TERM GOALS: Target date: 08/09/2023   Will be compliant with appropriate progressive HEP  Baseline: Goal status: MET  2.  Step lengths/stance times to be equal B and will be able to ambulate more consistently with upright posture/appropriate distance from rollator  Baseline:  Goal status: ON GOING   3.  Will report no more than 1 fall in the past 4 weeks  Baseline:  Goal status: MET  4.  Will be able to complete TUG test in 20 seconds or less LRAD   9/13: 34 seconds Baseline: 40.11 seconds with Rolator  Goal status: ON GOING  LONG TERM GOALS: Target date: 09/06/2023   MMT to improve by at least 1 grade all weak groups  Baseline:  Goal status: ON GOING  2.  Will report no falls within the past 4 weeks  Baseline:  Goal status: MET  3.  Will score at least 48 on Berg balance test  Baseline: No evidence of Berg preformed at evaluation Goal status: On going  4.  Will be able to ambulate community  distances with fatigue no more than 3/10, appropriate use of LRAD Baseline:  Goal status: MET  5.  Will be compliant with appropriate long term exercise program (land based vs water based vs silver sneakers group class) to maintain functional  gains and prevent return of functional impairments  Baseline:  Goal status: ON GOING  6.  Will be able to complete 5xSTS test in 15 seconds or less no posterior LOB   9/13: 21.7 seoncds Baseline: 36.60 seconds with upper extremity support Goal status: ON GOING  PLAN:  PT FREQUENCY: 2x/week  PT DURATION: 8 weeks  PLANNED INTERVENTIONS: Therapeutic exercises, Therapeutic activity, Neuromuscular re-education, Gait training, Self Care, and Manual therapy  PLAN FOR NEXT SESSION: Nustep for reciprocal movement training, gait and balance training, strength, gait training outdoors as well. Try RW instead of 4WW and see if she's a bit safer with it?   Rexene Agent, PTA 08/30/23 12:33 PM

## 2023-09-11 ENCOUNTER — Ambulatory Visit
Admission: RE | Admit: 2023-09-11 | Discharge: 2023-09-11 | Disposition: A | Discharge status: Home / Self Care | Payer: Medicare PPO | Source: Ambulatory Visit | Attending: Family Medicine | Admitting: Family Medicine

## 2023-09-11 DIAGNOSIS — Z1231 Encounter for screening mammogram for malignant neoplasm of breast: Secondary | ICD-10-CM | POA: Diagnosis not present

## 2023-09-26 ENCOUNTER — Encounter: Payer: Self-pay | Admitting: Neurology

## 2023-09-26 ENCOUNTER — Ambulatory Visit: Payer: Medicare PPO | Admitting: Neurology

## 2023-09-26 VITALS — BP 132/72 | Ht 67.0 in | Wt 191.0 lb

## 2023-09-26 DIAGNOSIS — R32 Unspecified urinary incontinence: Secondary | ICD-10-CM

## 2023-09-26 DIAGNOSIS — R269 Unspecified abnormalities of gait and mobility: Secondary | ICD-10-CM

## 2023-09-26 DIAGNOSIS — D225 Melanocytic nevi of trunk: Secondary | ICD-10-CM | POA: Diagnosis not present

## 2023-09-26 DIAGNOSIS — R4189 Other symptoms and signs involving cognitive functions and awareness: Secondary | ICD-10-CM

## 2023-09-26 DIAGNOSIS — Z85828 Personal history of other malignant neoplasm of skin: Secondary | ICD-10-CM | POA: Diagnosis not present

## 2023-09-26 DIAGNOSIS — L680 Hirsutism: Secondary | ICD-10-CM | POA: Diagnosis not present

## 2023-09-26 DIAGNOSIS — L0202 Furuncle of face: Secondary | ICD-10-CM | POA: Diagnosis not present

## 2023-09-26 DIAGNOSIS — L814 Other melanin hyperpigmentation: Secondary | ICD-10-CM | POA: Diagnosis not present

## 2023-09-26 DIAGNOSIS — Z872 Personal history of diseases of the skin and subcutaneous tissue: Secondary | ICD-10-CM | POA: Diagnosis not present

## 2023-09-26 DIAGNOSIS — Z09 Encounter for follow-up examination after completed treatment for conditions other than malignant neoplasm: Secondary | ICD-10-CM | POA: Diagnosis not present

## 2023-09-26 DIAGNOSIS — L821 Other seborrheic keratosis: Secondary | ICD-10-CM | POA: Diagnosis not present

## 2023-09-26 DIAGNOSIS — R410 Disorientation, unspecified: Secondary | ICD-10-CM | POA: Diagnosis not present

## 2023-09-26 DIAGNOSIS — Z08 Encounter for follow-up examination after completed treatment for malignant neoplasm: Secondary | ICD-10-CM | POA: Diagnosis not present

## 2023-09-26 MED ORDER — CARBIDOPA-LEVODOPA ER 25-100 MG PO TBCR: 1.0000 | EXTENDED_RELEASE_TABLET | Freq: Three times a day (TID) | ORAL | 5 refills | Status: DC

## 2023-09-26 NOTE — Progress Notes (Signed)
ASSESSMENT AND PLAN  Shelly Bennett is a 72 y.o. female   Cognitive impairment   Gait abnormality Most consistent with central nervous system degenerative disorder,  Multiple MRIs brain scans, mild small vessel disease, moderate atrophy, ventriculomegaly, overall stable compared to previous scan in 2020,  MRI cervical spine in May 2021 showed multilevel degenerative changes, with mild canal stenosis variable degree of foraminal narrowing  MRI of lumbar spine showed moderate stenosis L2-3, moderate to severe stenosis L3-4, variable degree of foraminal stenosis,  DaTSCAN was negative in March 2024, could not tolerate Sinemet IR due to GI side effect, did not provide help either,  Her gait abnormality multifactorial, brain atrophy, ventriculomegaly  likely play major role, but has been present since 2020, large volume LP on May 16th 2024 did not show significant improvement,  deconditioning, slow reaction time due to worsening cognitive impairment, moderate low back pain, left hip, knee pain all contributed too her gait abnormality.  She continued to progress with worsening gait abnormality, discussed with family would like to trial of Sinemet CR 25/100 mg,  Intermittent worsening confusion, EEG    DIAGNOSTIC DATA (LABS, IMAGING, TESTING) - I reviewed patient records, labs, notes, testing and imaging myself where available.  Neurosurgery evaluation by Dr. Cindee Bennett on May 10, 2023, patient with large-volume lumbar puncture, not a VP shunt candidate, moderate to severe lumbar stenosis, consider left L2-3 epidural injection for low back pain,  Addendum, evaluation at [facility] on July 25, 2023 by Dr.Moore, Shelly Bennett: Unclear diagnosis, may be Alzheimer's disease as part of the issue along with knee pain, was referred to cognitive neurologist, concerning lumbar puncture to assess disorder,  MEDICAL HISTORY: Shelly Bennett is a 72 years old female, seen in refer by her  primary care doctor Shelly Bennett for evaluation of anxiety, memory loss, initial evaluation was on November 6th 2018.    PMHx  of hypertension, coronary artery disease, hyperlipidemia,   She is a retired Tourist information centre manager. She drove herself to clinic today.   She reported stress, she is the main caretaker of her mother that is 67 years old, since 2017, she was noted to repeat herself, forget peoples name, displaced pains,   Since 2018, she also suffered depression anxiety, because of the strained relationship, she is taking BuSpar, and Paxil since August 2018 which seems to help her some,   She has mild gait abnormality due to previous left knee injury.   Laboratory evaluations, B12 474, normal liver functional tests, vitamin D level 57, lipid profile LDL 82, total cholesterol 161,   UPDATE Feb 6th 2019: She continue has mild memory loss, Mini-Mental Status Examination 28/30, MRI of the brain showed moderate generalized atrophy, ventriculomegaly mild supratentorium small vessel disease   She also complains of chronic left low back pain radiating pain to left hip, I have personally reviewed MRI lumbar in 2016, multilevel degenerative changes, most severe at L3-4, with moderate spinal stenosis, bilateral lateral recess stenosis.   UPDATE August 6th 2019: She still has mild memory loss, misplace things, her mother passed away on 06/01/2018, her left leg pain, low back pain has much improved after physical therapy   UPDATE March 10 2020: She is accompanied by her daughter Shelly Bennett at today's visit, she had worsening gait abnormality, memory loss, initially contributed to her depression of losing her mother in 2020, but her symptoms continue to progress over the past 1 year, especially her gait abnormality, she fell few times  in her yard, went to emergency room on February 10, 2020, fell about 4 feet off the edge of her porch, landed on her right side   She denies significant low back  pain, denies radiating pain to bilateral lower extremity, or lower extremity paresthesia, denies bowel bladder incontinence   I personally reviewed MRI of the brain in December 2020, generalized atrophy, mild supratentorial and small vessel disease, there was no acute abnormalities.   Laboratory evaluations in December 2020, positive COVID-19, normal and active RPR, Lyme titer, B12, ESR, arthritis panel, vitamin D, CBC, CMP elevated high-sensitivity C-reactive protein 4.8, positive scleroderma antibody, normal thyroid functional test   I personally reviewed MRI of lumbar spine in December 2019, severe spinal stenosis at L3-4, due to circumferential disc bulging, moderate facet and ligamentum flavum hypertrophy, with progressive severe spinal stenosis, mild left greater than right lateral recess stenosis, acute mild L1 and 2 compression fracture   UPDATE June 16 2020: She is accompanied by her daughter Shelly Bennett at today's clinical visit, her memory is relatively stable, Mini-Mental Status Examination in March was 28/30,   She was noted to have mild worsening of gait abnormality, contributed to her low back, hip pain We also personally reviewed MRI of the cervical spine in May 2021, multilevel degenerative changes, but no evidence of significant canal or foraminal stenosis.   MRI of brain in December 2020, no acute abnormality, mild supratentorium small vessel disease, unchanged mild enlarged bilateral lateral ventricle and third ventricle, mildly out of proportion to generalized brain atrophy, but no significant change compared to previous MRI in 2018  ++++++++++++ UPDATE Jan 24 2023: She was fairly stable before acute event in December 2023, 1 day she was found on the kitchen floor, with bowel and bladder incontinence, apparently she was trying to get up in the middle of the night, patient has no recollection of the event,  She remained confused for the next 2 days, does not know where she was,  increased gait abnormality, was seen by primary care physician, Personally reviewed MRI of the brain November 29, 2022, no acute intracranial abnormality, mild small vessel disease moderate cerebral atrophy, compared to previous MRIs in 2020, overall no significant change  Daughter reported a week prior to the incident, she was still driving, attending Christmas concert,  Since the event, she has to rely on her walker, confused, difficulty following recipe, increased gait abnormality, complains of low back pain, bowel and bladder incontinence, has to wear depends  We reviewed previous imaging studies, MRI cervical spine May 2021, multilevel degenerative changes, no significant canal stenosis, variable degree of foraminal narrowing at C3-4, C4-5,  MRI of lumbar in December 2019, acute mild L1-2 compression fracture, multilevel degenerative changes most noticeable at L3-4 with severe spinal stenosis  UPDATE March 27 2023: Is accompanied by her husband and daughter at today's clinical visit, presented to hospital on March 26 after fell twice in 1 day, stepping out of the car, getting up after lying in bed for a while, orthostatic blood pressure was negative during hospital stay, negative at today's visit as well  She complains of worsening left hip, knee pain, low back pain since fall  We personally reviewed MRI of cervical spine March 07, 2023 multilevel degenerative changes, no significant canal stenosis, moderate to severe foraminal narrowing at C5-6, worse on the right  MRI of lumbar spine moderate central canal stenosis L2-3, moderately severe canal stenosis L3-4, slight worsening compared to previous exam  MRI of the brain December  2023, no acute abnormality, mild small vessel disease, moderate cerebral atrophy, evidence of ventriculomegaly, stable compared to previous scan in 2020  DaTSCAN March 13, 2023 showed no evidence of reduced radiotracer activity at basal ganglion, she was giving a  trial of sinemet, could not tolerated due to GI side effect, did not provide help either  Laboratory evaluation showed mild low potassium 3.0 initially, has no significant abnormality.  UPDATE May 02 2023:  Neurosurgeon evaluation by Dr. Marliss Czar on April 15, 2023, progressive neurodegenerative disorder, decreased cognitive function, Long discussion with patient and family, with the stability of her ventricular size, suspicious for normal pressure hydrocephalus is not high, nevertheless, with her significant magnetic gait, functional disability, do suggest high-volume lumbar puncture for further evaluation,  Family decided to proceed with large-volume lumbar puncture, on schedule today, accompanied by husband and daughter at today's clinical visit, pre and post lumbar puncture evaluation, complains of 5 out of 10 right knee and left hip pain, gait abnormality,  MoCA 19/30 pre-LP, will repeat post LP, spinal fluid testing including phosphorylated tau, a beta 42 analysis,  I evaluated patient before and right after lumbar puncture, there was no significant change noted in her gait abnormality, cognitive function,  UPDATE July 05 2023: She return for EMG/NCS study today, which is essential normal, there was no large fiber neuropathy, active lumbar radiculopathy. Again reviewed history with patient and husband, slow worsening gait abnormality since 2020, acute worsening since Nov 2023, upper respiratory infection, cough for one month.   CSF on May 02 2023:  normal protein,  glucose, cell count  UPDATE Sep 26 2023: She is accompanied by her daughter and husband at today's clinical visit, continue to decline slowly, was seen by [facility] neurologist Dr. Nunzio Cory on July 25, 2023, agreed on the the diagnosis of central nervous system degenerative disorder, but no clear etiology, worried may be Alzheimer's disease, unlikely to be progressive supranuclear palsy, planning on follow-up with [facility]  neurology in 6 months  [facility] healthcare admission on August 05, 2023 for speech difficulty right-sided weakness numbness, MRI of the brain showed no acute abnormality, generalized atrophy  Echocardiogram normal ejection fraction, no significant abnormality  Normal CBC, CMP, A1c 5.4, LDL 76  PHYSICAL EXAM:     09/26/2023   10:18 AM 08/15/2023    2:31 PM 08/15/2023    2:14 PM  Vitals with BMI  Height 5\' 7"     Weight 191 lbs    BMI 29.91    Systolic 132 134 562  Diastolic 72 77 72    PHYSICAL EXAMNIATION:  Gen: NAD, conversant, well nourised, well groomed                     Cardiovascular: Regular rate rhythm, no peripheral edema, warm, nontender. Eyes: Conjunctivae clear without exudates or hemorrhage Neck: Supple, no carotid bruits. Pulmonary: Clear to auscultation bilaterally   NEUROLOGICAL EXAM:  MENTAL STATUS: Speech/cognition: Awake, alert, oriented to history taking and casual conversation     05/02/2023   11:00 AM 05/02/2023    9:00 AM 03/27/2023    9:00 AM  Montreal Cognitive Assessment   Visuospatial/ Executive (0/5) 3 3 1   Naming (0/3) 3 3 3   Attention: Read list of digits (0/2) 2 2 2   Attention: Read list of letters (0/1) 1 1 0  Attention: Serial 7 subtraction starting at 100 (0/3) 1 3 2   Language: Repeat phrase (0/2) 2 2 2   Language : Fluency (0/1) 1 1 1  Abstraction (0/2) 2 2 2   Delayed Recall (0/5) 1 1 0  Orientation (0/6) 4 1 5   Total 20 19 18     CRANIAL NERVES: CN II: Visual fields are full to confrontation. Pupils are round equal and briskly reactive to light. CN III, IV, VI: extraocular movement are normal. No ptosis. CN V: Facial sensation is intact to light touch CN VII: Face is symmetric with normal eye closure  CN VIII: Hearing is normal to causal conversation. CN IX, X: Phonation is normal. CN XI: Head turning and shoulder shrug are intact  MOTOR: Left shoulder pain limited proximal upper extremity strength no significant upper extremity  weakness  Mild limitation of left lower extremity movement due to left hip and knee pain, felt there was no significant weakness, I did not notice significant rigidity or bradykinesia  REFLEXES: Brisk reflex, more noticeable at left upper and lower extremity, bilateral Babinski sign  SENSORY: Intact to light touch,   COORDINATION: There is no trunk or limb dysmetria noted.  GAIT/STANCE: Need push-up to get up from seated position, wide-based , antalgic, cautious, difficulty initiate gait  REVIEW OF SYSTEMS:  Full 14 system review of systems performed and notable only for as above All other review of systems were negative.   ALLERGIES: Allergies  Allergen Reactions   Neomycin-Polymyxin-Dexameth     Other reaction(s): eye redness   Codeine     ? Reaction ER visit.    Mold Extract [Trichophyton]     Migraine   Penicillins Other (See Comments)    "drew my legs up as child" Pt has tolerated cephalexin in the past.   Ace Inhibitors Cough   Angiotensin Receptor Blockers Cough   Levaquin [Levofloxacin] Rash    Per notes from outpatient provider   Vytorin [Ezetimibe-Simvastatin] Other (See Comments)    cramping   Zetia [Ezetimibe] Other (See Comments)    cramping    HOME MEDICATIONS: Current Outpatient Medications  Medication Sig Dispense Refill   amLODipine (NORVASC) 10 MG tablet Take 1 tablet (10 mg total) by mouth daily. 90 tablet 3   aspirin 81 MG tablet Take 81 mg by mouth daily.      buPROPion (WELLBUTRIN XL) 150 MG 24 hr tablet Take 1 tablet (150 mg total) by mouth daily. 90 tablet 3   calcium carbonate (OSCAL) 1500 (600 Ca) MG TABS tablet Take 600 mg of elemental calcium by mouth daily.     Cholecalciferol (VITAMIN D-3) 5000 UNITS TABS Take 1 capsule by mouth See admin instructions. Take one capsule by mouth daily Mon thru Friday     diclofenac (VOLTAREN) 50 MG EC tablet Take 1 tablet (50 mg total) by mouth 2 (two) times daily. 180 tablet 3   donepezil (ARICEPT) 10 MG  tablet Take 1 tablet (10 mg total) by mouth at bedtime. 90 tablet 3   hydrochlorothiazide (HYDRODIURIL) 25 MG tablet Take 1 tablet (25 mg total) by mouth daily. TAKE 1/2 TABLET DAILY (Patient taking differently: Take 25 mg by mouth daily.) 90 tablet 1   memantine (NAMENDA) 10 MG tablet Take 1 tablet (10 mg total) by mouth 2 (two) times daily. 180 tablet 3   montelukast (SINGULAIR) 10 MG tablet Take 1 tablet (10 mg total) by mouth at bedtime. 90 tablet 3   omega-3 acid ethyl esters (LOVAZA) 1 g capsule TAKE TWO CAPSULES TWICE DAILY (Patient taking differently: Take 2 g by mouth 2 (two) times daily.) 360 capsule 3   polyethylene glycol (MIRALAX / GLYCOLAX) 17 g packet  Take 17 g by mouth 2 (two) times daily. 14 each 0   rosuvastatin (CRESTOR) 20 MG tablet Take 1 tablet (20 mg total) by mouth daily. 90 tablet 3   senna-docusate (SENOKOT-S) 8.6-50 MG tablet Take 1 tablet by mouth 2 (two) times daily.     No current facility-administered medications for this visit.    PAST MEDICAL HISTORY: Past Medical History:  Diagnosis Date   Adenomatous colon polyp    Allergic drug reaction 06/25/2015   Allergy    Anxiety    Arthritis    At risk for falls    fallen 4 times recently    CAD (coronary artery disease)    Dr. Excell Seltzer follows    Carpal tunnel syndrome    Chronic headaches    Coccydynia 02/01/2012   Cystocele    Diverticulosis    External hemorrhoids    Focal nodular hyperplasia of liver    Gait abnormality    GERD (gastroesophageal reflux disease)    some   HCAP (healthcare-associated pneumonia) 06/25/2015   Heart murmur    HLD (hyperlipidemia)    HTN (hypertension)    IBS (irritable bowel syndrome)    Memory loss    Neuromuscular disorder (HCC)    some neuropathy issues in the past- legs fibromyalgia in past-- past hx of steroid injections   Obesity    Osteoporosis    Otitis of left ear 02/01/2017   Pneumonia    Skin cancer    squamous cell - face    PAST SURGICAL HISTORY: Past  Surgical History:  Procedure Laterality Date   CARPAL TUNNEL RELEASE     COLONOSCOPY  12/15/2008   diverticulosis, external hemorrhoids   KNEE SURGERY  05/22/2015   right   LAPAROSCOPY     SKIN SURGERY     squamous cell - right face     FAMILY HISTORY: Family History  Problem Relation Age of Onset   Hyperlipidemia Mother    Hypertension Mother    Kidney disease Mother    Osteoporosis Mother    Heart murmur Mother    Dementia Mother    Prostate cancer Father    Colon cancer Father 58   Kidney disease Father    Heart disease Father    Alzheimer's disease Father    Hyperlipidemia Sister    Hypertension Sister    Rectal cancer Maternal Grandmother 40   Heart disease Maternal Grandfather    Heart disease Brother 71       not dx questionable    Colon polyps Neg Hx    Esophageal cancer Neg Hx    Stomach cancer Neg Hx    Breast cancer Neg Hx     SOCIAL HISTORY: Social History   Socioeconomic History   Marital status: Married    Spouse name: Maisie Fus   Number of children: 4   Years of education: 16   Highest education level: Bachelor's degree (e.g., BA, AB, BS)  Occupational History   Occupation: Runner, broadcasting/film/video retired  Tobacco Use   Smoking status: Never   Smokeless tobacco: Never  Vaping Use   Vaping status: Never Used  Substance and Sexual Activity   Alcohol use: No   Drug use: No   Sexual activity: Not Currently    Birth control/protection: None  Other Topics Concern   Not on file  Social History Narrative   Lives at home with her husband.   Right-handed.   1 cup coffee each morning. 2-3 small soda cans each day.  Social Determinants of Health   Financial Resource Strain: Low Risk  (08/14/2023)   Overall Financial Resource Strain (CARDIA)    Difficulty of Paying Living Expenses: Not hard at all  Food Insecurity: No Food Insecurity (08/14/2023)   Hunger Vital Sign    Worried About Running Out of Food in the Last Year: Never true    Ran Out of Food in the Last  Year: Never true  Transportation Needs: No Transportation Needs (08/14/2023)   PRAPARE - Administrator, Civil Service (Medical): No    Lack of Transportation (Non-Medical): No  Physical Activity: Insufficiently Active (08/14/2023)   Exercise Vital Sign    Days of Exercise per Week: 3 days    Minutes of Exercise per Session: 30 min  Stress: No Stress Concern Present (08/14/2023)   [employer] of Occupational Health - Occupational Stress Questionnaire    Feeling of Stress : Not at all  Social Connections: Socially Integrated (08/14/2023)   Social Connection and Isolation Panel [NHANES]    Frequency of Communication with Friends and Family: More than three times a week    Frequency of Social Gatherings with Friends and Family: More than three times a week    Attends Religious Services: More than 4 times per year    Active Member of [employer] or Organizations: Yes    Attends Banker Meetings: More than 4 times per year    Marital Status: Married  Catering manager Violence: Not At Risk (08/14/2023)   Humiliation, Afraid, Rape, and Kick questionnaire    Fear of Current or Ex-Partner: No    Emotionally Abused: No    Physically Abused: No    Sexually Abused: No      Levert Feinstein, M.D. Ph.D.  [facility] [address] [place], [place] [address] Ph: [phone] Fax: [phone]  CC:  Dettinger, Elige Radon, MD [address] [place],  [place] [address]  Dettinger, Elige Radon, MD

## 2023-09-30 ENCOUNTER — Encounter: Payer: Self-pay | Admitting: Internal Medicine

## 2023-10-02 ENCOUNTER — Telehealth: Payer: Self-pay

## 2023-10-02 NOTE — Telephone Encounter (Signed)
The patient has had several appointments scheduled recently and would like to postpone her visit to January. Rescheduled her appointment to 01/02/2024 and scheduled her husband on the same day as requested.

## 2023-10-03 ENCOUNTER — Ambulatory Visit: Payer: Medicare PPO | Admitting: Cardiovascular Disease

## 2023-10-31 ENCOUNTER — Ambulatory Visit: Payer: Medicare PPO

## 2023-10-31 ENCOUNTER — Ambulatory Visit: Payer: Medicare PPO | Admitting: Neurology

## 2023-10-31 VITALS — Ht 67.0 in | Wt 191.0 lb

## 2023-10-31 DIAGNOSIS — R4182 Altered mental status, unspecified: Secondary | ICD-10-CM | POA: Diagnosis not present

## 2023-10-31 DIAGNOSIS — Z860100 Personal history of colon polyps, unspecified: Secondary | ICD-10-CM

## 2023-10-31 DIAGNOSIS — R4189 Other symptoms and signs involving cognitive functions and awareness: Secondary | ICD-10-CM

## 2023-10-31 DIAGNOSIS — R269 Unspecified abnormalities of gait and mobility: Secondary | ICD-10-CM

## 2023-10-31 DIAGNOSIS — R32 Unspecified urinary incontinence: Secondary | ICD-10-CM

## 2023-10-31 NOTE — Progress Notes (Signed)
No egg or soy allergy known to patient  No issues known to pt with past sedation with any surgeries or procedures Patient denies ever being told they had issues or difficulty with intubation  No FH of Malignant Hyperthermia Pt is not on diet pills Pt is not on  home 02  Pt is not on blood thinners  Pt denies issues with constipation  No A fib or A flutter Have any cardiac testing pending-- no  LOA: independent  Prep: spilt dose miralax   Patient's chart reviewed by Cathlyn Parsons CNRA prior to previsit and patient appropriate for the [facility].  Previsit completed and red dot placed by patient's name on their procedure day (on provider's schedule).     PV competed with patients daughter Julieta Gutting (on DPR) due to patient experiencing some memory issues. Prep instructions sent via mychart and home address.

## 2023-11-04 ENCOUNTER — Encounter: Payer: Self-pay | Admitting: Internal Medicine

## 2023-11-12 NOTE — Procedures (Signed)
   HISTORY: 72 year old female with progressive worsening cognitive impairment, gait abnormalities.  TECHNIQUE:  This is a routine 16 channel EEG recording with one channel devoted to a limited EKG recording.  It was performed during wakefulness, drowsiness and asleep.  Hyperventilation and photic stimulation were performed as activating procedures.  There are minimum muscle and movement artifact noted.  Upon maximum arousal, posterior dominant waking rhythm consistent of mildly dysrhythmic theta range activity. Activities are symmetric over the bilateral posterior derivations and attenuated with eye opening.  Photic stimulation did not alter the tracing.  Hyperventilation produced mild/moderate buildup with higher amplitude and the slower activities noted.  During EEG recording, patient developed drowsiness and no deeper stage of sleep was achieved.  During EEG recording, there was no epileptiform discharge noted.  EKG demonstrate normal sinus rhythm.  CONCLUSION: This is a mild abnormal awake EEG.  There is evidence of mild background slowing indicating mild bihemispheric malfunction.Levert Feinstein, M.D. Ph.D.  [facility] [address] [place], [place] [address] Phone: [phone] Fax:      [phone]

## 2023-11-18 ENCOUNTER — Other Ambulatory Visit: Payer: Self-pay | Admitting: Cardiovascular Disease

## 2023-11-18 ENCOUNTER — Other Ambulatory Visit: Payer: Self-pay | Admitting: Family Medicine

## 2023-11-18 DIAGNOSIS — Q2381 Bicuspid aortic valve: Secondary | ICD-10-CM

## 2023-11-18 DIAGNOSIS — I2510 Atherosclerotic heart disease of native coronary artery without angina pectoris: Secondary | ICD-10-CM

## 2023-11-18 DIAGNOSIS — G3184 Mild cognitive impairment, so stated: Secondary | ICD-10-CM

## 2023-11-18 DIAGNOSIS — I10 Essential (primary) hypertension: Secondary | ICD-10-CM

## 2023-11-18 DIAGNOSIS — E782 Mixed hyperlipidemia: Secondary | ICD-10-CM

## 2023-11-19 ENCOUNTER — Other Ambulatory Visit: Payer: Self-pay | Admitting: Cardiovascular Disease

## 2023-11-19 DIAGNOSIS — I2510 Atherosclerotic heart disease of native coronary artery without angina pectoris: Secondary | ICD-10-CM

## 2023-11-19 DIAGNOSIS — I10 Essential (primary) hypertension: Secondary | ICD-10-CM

## 2023-11-19 DIAGNOSIS — E782 Mixed hyperlipidemia: Secondary | ICD-10-CM

## 2023-11-19 DIAGNOSIS — Q2381 Bicuspid aortic valve: Secondary | ICD-10-CM

## 2023-11-20 ENCOUNTER — Encounter: Payer: Self-pay | Admitting: Certified Registered Nurse Anesthetist

## 2023-11-20 NOTE — Progress Notes (Unsigned)
[facility] History and Physical   Primary Care Physician:  Dettinger, Elige Radon, MD   Reason for Procedure:   Hx colon polyps  Plan:    colonoscopy     HPI: Shelly Bennett is a 72 y.o. female here for surveillance colonoscopy  Adenoma 2004 No polyps 2009 01/14/2014 diminutive sigmoid polyp removed  07/2019 4 diminutive adenomas recall 2023 Father had colon cancer grandparent had rectal cancer Past Medical History:  Diagnosis Date   Adenomatous colon polyp    Allergic drug reaction 06/25/2015   Allergy    Anxiety    Arthritis    At risk for falls    fallen 4 times recently    CAD (coronary artery disease)    Dr. Excell Seltzer follows    Carpal tunnel syndrome    Chronic headaches    Coccydynia 02/01/2012   Cystocele    Diverticulosis    External hemorrhoids    Focal nodular hyperplasia of liver    Gait abnormality    GERD (gastroesophageal reflux disease)    some   HCAP (healthcare-associated pneumonia) 06/25/2015   Heart murmur    HLD (hyperlipidemia)    HTN (hypertension)    IBS (irritable bowel syndrome)    Memory loss    Neuromuscular disorder (HCC)    some neuropathy issues in the past- legs fibromyalgia in past-- past hx of steroid injections   Obesity    Osteoporosis    Otitis of left ear 02/01/2017   Pneumonia    Skin cancer    squamous cell - face    Past Surgical History:  Procedure Laterality Date   CARPAL TUNNEL RELEASE     COLONOSCOPY  12/15/2008   diverticulosis, external hemorrhoids   KNEE SURGERY  05/22/2015   right   LAPAROSCOPY     SKIN SURGERY     squamous cell - right face     Prior to Admission medications   Medication Sig Start Date End Date Taking? Authorizing Provider  amLODipine (NORVASC) 10 MG tablet TAKE ONE TABLET DAILY 11/19/23   Tonny Bollman, MD  aspirin 81 MG tablet Take 81 mg by mouth daily.     [provider]  buPROPion (WELLBUTRIN XL) 150 MG 24 hr tablet Take 1 tablet (150 mg total) by mouth  daily. 06/27/23   Dettinger, Elige Radon, MD  calcium carbonate (OSCAL) 1500 (600 Ca) MG TABS tablet Take 600 mg of elemental calcium by mouth daily.    [provider]  Carbidopa-Levodopa ER (SINEMET CR) 25-100 MG tablet controlled release Take 1 tablet by mouth 3 (three) times daily. 09/26/23   Levert Feinstein, MD  Cholecalciferol (VITAMIN D-3) 5000 UNITS TABS Take 1 capsule by mouth See admin instructions. Take one capsule by mouth daily Mon thru Friday    [provider]  diclofenac (VOLTAREN) 50 MG EC tablet Take 1 tablet (50 mg total) by mouth 2 (two) times daily. 06/27/23   Dettinger, Elige Radon, MD  donepezil (ARICEPT) 10 MG tablet TAKE 1 TABLET AT BEDTIME 11/18/23   Dettinger, Elige Radon, MD  hydrochlorothiazide (HYDRODIURIL) 25 MG tablet Take 1 tablet (25 mg total) by mouth daily. TAKE 1/2 TABLET DAILY Patient taking differently: Take 25 mg by mouth daily. 08/15/23   Junie Spencer, FNP  memantine (NAMENDA) 10 MG tablet Take 1 tablet (10 mg total) by mouth 2 (two) times daily. 06/27/23   Dettinger, Elige Radon, MD  montelukast (SINGULAIR) 10 MG tablet Take 1 tablet (10 mg total) by mouth at  bedtime. 06/27/23   Dettinger, Elige Radon, MD  omega-3 acid ethyl esters (LOVAZA) 1 g capsule TAKE TWO CAPSULES TWICE DAILY Patient taking differently: Take 2 g by mouth 2 (two) times daily. 12/11/22   Dettinger, Elige Radon, MD  polyethylene glycol (MIRALAX / GLYCOLAX) 17 g packet Take 17 g by mouth 2 (two) times daily. Patient taking differently: Take 17 g by mouth daily as needed. 03/15/23   Briant Cedar, MD  rosuvastatin (CRESTOR) 20 MG tablet Take 1 tablet (20 mg total) by mouth daily. 06/27/23   Dettinger, Elige Radon, MD  senna-docusate (SENOKOT-S) 8.6-50 MG tablet Take 1 tablet by mouth 2 (two) times daily. Patient taking differently: Take 1 tablet by mouth daily as needed. 03/15/23   Briant Cedar, MD    Current Outpatient Medications  Medication Sig Dispense Refill   amLODipine  (NORVASC) 10 MG tablet TAKE ONE TABLET DAILY 30 tablet 1   aspirin 81 MG tablet Take 81 mg by mouth daily.      buPROPion (WELLBUTRIN XL) 150 MG 24 hr tablet Take 1 tablet (150 mg total) by mouth daily. 90 tablet 3   calcium carbonate (OSCAL) 1500 (600 Ca) MG TABS tablet Take 600 mg of elemental calcium by mouth daily.     Carbidopa-Levodopa ER (SINEMET CR) 25-100 MG tablet controlled release Take 1 tablet by mouth 3 (three) times daily. 90 tablet 5   Cholecalciferol (VITAMIN D-3) 5000 UNITS TABS Take 1 capsule by mouth See admin instructions. Take one capsule by mouth daily Mon thru Friday     donepezil (ARICEPT) 10 MG tablet TAKE 1 TABLET AT BEDTIME 90 tablet 0   hydrochlorothiazide (HYDRODIURIL) 25 MG tablet Take 1 tablet (25 mg total) by mouth daily. TAKE 1/2 TABLET DAILY (Patient taking differently: Take 25 mg by mouth daily.) 90 tablet 1   memantine (NAMENDA) 10 MG tablet Take 1 tablet (10 mg total) by mouth 2 (two) times daily. 180 tablet 3   montelukast (SINGULAIR) 10 MG tablet Take 1 tablet (10 mg total) by mouth at bedtime. 90 tablet 3   omega-3 acid ethyl esters (LOVAZA) 1 g capsule TAKE TWO CAPSULES TWICE DAILY (Patient taking differently: Take 2 g by mouth 2 (two) times daily.) 360 capsule 3   rosuvastatin (CRESTOR) 20 MG tablet Take 1 tablet (20 mg total) by mouth daily. 90 tablet 3   diclofenac (VOLTAREN) 50 MG EC tablet Take 1 tablet (50 mg total) by mouth 2 (two) times daily. 180 tablet 3   polyethylene glycol (MIRALAX / GLYCOLAX) 17 g packet Take 17 g by mouth 2 (two) times daily. (Patient taking differently: Take 17 g by mouth daily as needed.) 14 each 0   senna-docusate (SENOKOT-S) 8.6-50 MG tablet Take 1 tablet by mouth 2 (two) times daily. (Patient taking differently: Take 1 tablet by mouth daily as needed.)     Current Facility-Administered Medications  Medication Dose Route Frequency Provider Last Rate Last Admin   0.9 %  sodium chloride infusion  500 mL Intravenous Once  Iva Boop, MD        Allergies as of 11/21/2023 - Review Complete 11/21/2023  Allergen Reaction Noted   Neomycin-polymyxin-dexameth  06/21/2022   Codeine  07/07/2015   Mold extract [trichophyton]     Neomycin  04/15/2023   Penicillins Other (See Comments) 12/02/2008   Simvastatin  04/15/2023   Ace inhibitors Cough 03/11/2013   Angiotensin receptor blockers Cough 03/11/2013   Levaquin [levofloxacin] Rash 06/26/2015   Vytorin [ezetimibe-simvastatin] Other (  See Comments) 03/11/2013   Zetia [ezetimibe] Other (See Comments) 03/11/2013    Family History  Problem Relation Age of Onset   Hyperlipidemia Mother    Hypertension Mother    Kidney disease Mother    Osteoporosis Mother    Heart murmur Mother    Dementia Mother    Prostate cancer Father    Colon cancer Father 5   Kidney disease Father    Heart disease Father    Alzheimer's disease Father    Hyperlipidemia Sister    Hypertension Sister    Rectal cancer Maternal Grandmother 51   Heart disease Maternal Grandfather    Heart disease Brother 73       not dx questionable    Colon polyps Neg Hx    Esophageal cancer Neg Hx    Stomach cancer Neg Hx    Breast cancer Neg Hx     Social History   Socioeconomic History   Marital status: Married    Spouse name: Maisie Fus   Number of children: 4   Years of education: 16   Highest education level: Bachelor's degree (e.g., BA, AB, BS)  Occupational History   Occupation: Runner, broadcasting/film/video retired  Tobacco Use   Smoking status: Never   Smokeless tobacco: Never  Vaping Use   Vaping status: Never Used  Substance and Sexual Activity   Alcohol use: No   Drug use: No   Sexual activity: Not Currently    Birth control/protection: None  Other Topics Concern   Not on file  Social History Narrative   Lives at home with her husband.   Right-handed.   1 cup coffee each morning. 2-3 small soda cans each day.   Social Determinants of Health   Financial Resource Strain: Low Risk   (08/14/2023)   Overall Financial Resource Strain (CARDIA)    Difficulty of Paying Living Expenses: Not hard at all  Food Insecurity: No Food Insecurity (08/14/2023)   Hunger Vital Sign    Worried About Running Out of Food in the Last Year: Never true    Ran Out of Food in the Last Year: Never true  Transportation Needs: No Transportation Needs (08/14/2023)   PRAPARE - Administrator, Civil Service (Medical): No    Lack of Transportation (Non-Medical): No  Physical Activity: Insufficiently Active (08/14/2023)   Exercise Vital Sign    Days of Exercise per Week: 3 days    Minutes of Exercise per Session: 30 min  Stress: No Stress Concern Present (08/14/2023)   [employer] of Occupational Health - Occupational Stress Questionnaire    Feeling of Stress : Not at all  Social Connections: Socially Integrated (08/14/2023)   Social Connection and Isolation Panel [NHANES]    Frequency of Communication with Friends and Family: More than three times a week    Frequency of Social Gatherings with Friends and Family: More than three times a week    Attends Religious Services: More than 4 times per year    Active Member of [employer] or Organizations: Yes    Attends Engineer, structural: More than 4 times per year    Marital Status: Married  Catering manager Violence: Not At Risk (08/14/2023)   Humiliation, Afraid, Rape, and Kick questionnaire    Fear of Current or Ex-Partner: No    Emotionally Abused: No    Physically Abused: No    Sexually Abused: No    Review of Systems:  All other review of systems negative except as mentioned in  the HPI.  Physical Exam: Vital signs BP 131/68   Pulse (!) 58   Temp 97.8 F (36.6 C)   Resp 18   Ht 5\' 7"  (1.702 m)   Wt 191 lb (86.6 kg)   SpO2 98%   BMI 29.91 kg/m   General:   Alert,  Well-developed, well-nourished, pleasant and cooperative in NAD Lungs:  Clear throughout to auscultation.   Heart:  Regular rate and rhythm; no  murmurs, clicks, rubs,  or gallops. Abdomen:  Soft, nontender and nondistended. Normal bowel sounds.   Neuro/Psych:  Alert and cooperative. Normal mood and affect. A and O x 3   @Abdulahad Mederos  Sena Slate, MD, [facility] [phone] (pager) 11/21/2023 1:34 PM@

## 2023-11-21 ENCOUNTER — Ambulatory Visit: Payer: Medicare PPO | Admitting: Internal Medicine

## 2023-11-21 ENCOUNTER — Encounter: Payer: Self-pay | Admitting: Internal Medicine

## 2023-11-21 VITALS — BP 123/55 | HR 56 | Temp 97.8°F | Resp 8 | Ht 67.0 in | Wt 191.0 lb

## 2023-11-21 DIAGNOSIS — F419 Anxiety disorder, unspecified: Secondary | ICD-10-CM | POA: Diagnosis not present

## 2023-11-21 DIAGNOSIS — Z800 Family history of malignant neoplasm of digestive organs: Secondary | ICD-10-CM | POA: Diagnosis not present

## 2023-11-21 DIAGNOSIS — Z1211 Encounter for screening for malignant neoplasm of colon: Secondary | ICD-10-CM

## 2023-11-21 DIAGNOSIS — Z860100 Personal history of colon polyps, unspecified: Secondary | ICD-10-CM

## 2023-11-21 DIAGNOSIS — I2510 Atherosclerotic heart disease of native coronary artery without angina pectoris: Secondary | ICD-10-CM | POA: Diagnosis not present

## 2023-11-21 DIAGNOSIS — K5730 Diverticulosis of large intestine without perforation or abscess without bleeding: Secondary | ICD-10-CM | POA: Diagnosis not present

## 2023-11-21 DIAGNOSIS — Z860101 Personal history of adenomatous and serrated colon polyps: Secondary | ICD-10-CM

## 2023-11-21 DIAGNOSIS — I10 Essential (primary) hypertension: Secondary | ICD-10-CM | POA: Diagnosis not present

## 2023-11-21 MED ORDER — SODIUM CHLORIDE 0.9 % IV SOLN: 500.0000 mL | Freq: Once | INTRAVENOUS | Status: DC

## 2023-11-21 NOTE — Progress Notes (Deleted)
Pt's states no medical or surgical changes since previsit or office visit. 

## 2023-11-21 NOTE — Patient Instructions (Addendum)
No polyps today!  You still have diverticulosis - thickened muscle rings and pouches in the colon wall. Please read the handout about this condition.  We can consider a repeat colonoscopy in 5 years if healthy enough since your father had colon cancer and grandparent had rectal cancer.  I appreciate the opportunity to care for you. Iva Boop, MD, FACG  YOU HAD AN ENDOSCOPIC PROCEDURE TODAY AT THE [facility]:   Refer to the procedure report that was given to you for any specific questions about what was found during the examination.  If the procedure report does not answer your questions, please call your gastroenterologist to clarify.  If you requested that your care partner not be given the details of your procedure findings, then the procedure report has been included in a sealed envelope for you to review at your convenience later.  YOU SHOULD EXPECT: Some feelings of bloating in the abdomen. Passage of more gas than usual.  Walking can help get rid of the air that was put into your GI tract during the procedure and reduce the bloating. If you had a lower endoscopy (such as a colonoscopy or flexible sigmoidoscopy) you may notice spotting of blood in your stool or on the toilet paper. If you underwent a bowel prep for your procedure, you may not have a normal bowel movement for a few days.  Please Note:  You might notice some irritation and congestion in your nose or some drainage.  This is from the oxygen used during your procedure.  There is no need for concern and it should clear up in a day or so.  SYMPTOMS TO REPORT IMMEDIATELY:  Following lower endoscopy (colonoscopy or flexible sigmoidoscopy):  Excessive amounts of blood in the stool  Significant tenderness or worsening of abdominal pains  Swelling of the abdomen that is new, acute  Fever of 100F or higher  For urgent or emergent issues, a gastroenterologist can be reached at any hour by calling [phone]  [phone]. Do not use MyChart messaging for urgent concerns.    DIET:  We do recommend a small meal at first, but then you may proceed to your regular diet.  Drink plenty of fluids but you should avoid alcoholic beverages for 24 hours.  ACTIVITY:  You should plan to take it easy for the rest of today and you should NOT DRIVE or use heavy machinery until tomorrow (because of the sedation medicines used during the test).    FOLLOW UP: Our staff will call the number listed on your records the next business day following your procedure.  We will call around 7:15- 8:00 am to check on you and address any questions or concerns that you may have regarding the information given to you following your procedure. If we do not reach you, we will leave a message.      SIGNATURES/CONFIDENTIALITY: You and/or your care partner have signed paperwork which will be entered into your electronic medical record.  These signatures attest to the fact that that the information above on your After Visit Summary has been reviewed and is understood.  Full responsibility of the confidentiality of this discharge information lies with you and/or your care-partner.

## 2023-11-21 NOTE — Progress Notes (Signed)
Pt's states no medical or surgical changes since previsit or office visit. 

## 2023-11-21 NOTE — Progress Notes (Signed)
Report given to PACU, vss 

## 2023-11-21 NOTE — Op Note (Signed)
[facility] Patient Name: Shelly Bennett Procedure Date: 11/21/2023 12:37 PM MRN: [id number] Endoscopist: Iva Boop , MD, [id number] Age: 72 Referring MD:  Date of Birth: 1951/07/19 Gender: Female Account #: [id number] Procedure:                Colonoscopy Indications:              Surveillance: Personal history of adenomatous                            polyps on last colonoscopy > 5 years ago, Last                            colonoscopy: 2018 Medicines:                Monitored Anesthesia Care Procedure:                Pre-Anesthesia Assessment:                           - Prior to the procedure, a History and Physical                            was performed, and patient medications and                            allergies were reviewed. The patient's tolerance of                            previous anesthesia was also reviewed. The risks                            and benefits of the procedure and the sedation                            options and risks were discussed with the patient.                            All questions were answered, and informed consent                            was obtained. Prior Anticoagulants: The patient has                            taken no anticoagulant or antiplatelet agents. ASA                            Grade Assessment: III - A patient with severe                            systemic disease. After reviewing the risks and                            benefits, the patient was deemed in satisfactory  condition to undergo the procedure.                           After obtaining informed consent, the colonoscope                            was passed under direct vision. Throughout the                            procedure, the patient's blood pressure, pulse, and                            oxygen saturations were monitored continuously. The                            CF [id number] #[id number] was introduced  through the anus                            and advanced to the the cecum, identified by                            appendiceal orifice and ileocecal valve. The                            colonoscopy was performed without difficulty. The                            patient tolerated the procedure well. The quality                            of the bowel preparation was good. The ileocecal                            valve, appendiceal orifice, and rectum were                            photographed. The bowel preparation used was                            Miralax via split dose instruction. Scope In: 1:40:09 PM Scope Out: 1:53:14 PM Scope Withdrawal Time: 0 hours 10 minutes 0 seconds  Total Procedure Duration: 0 hours 13 minutes 5 seconds  Findings:                 The perianal and digital rectal examinations were                            normal.                           Multiple diverticula were found in the sigmoid                            colon and descending colon.  The exam was otherwise without abnormality on                            direct and retroflexion views. Complications:            No immediate complications. Estimated Blood Loss:     Estimated blood loss: none. Impression:               - Diverticulosis in the sigmoid colon and in the                            descending colon.                           - The examination was otherwise normal on direct                            and retroflexion views.                           - No specimens collected.                           - Personal history of colonic polyps. Adenoma 2004                           No polyps 2009                           01/14/2014 diminutive sigmoid polyp removed                           07/2019 4 diminutive adenomas                           Father had colon cancer and a grandparent had                            rectal cancer Recommendation:           - Patient  has a contact number available for                            emergencies. The signs and symptoms of potential                            delayed complications were discussed with the                            patient. Return to normal activities tomorrow.                            Written discharge instructions were provided to the                            patient.                           -  Resume previous diet.                           - Continue present medications.                           - Repeat colonoscopy in 5 years if health status                            permits and patient desires. Iva Boop, MD 11/21/2023 2:01:04 PM This report has been signed electronically.

## 2023-11-22 ENCOUNTER — Telehealth: Payer: Self-pay

## 2023-11-22 NOTE — Telephone Encounter (Signed)
  Follow up Call-     11/21/2023   12:53 PM  Call back number  Post procedure Call Back phone  # [phone]  Permission to leave phone message Yes     Patient questions:  Do you have a fever, pain , or abdominal swelling? No. Pain Score  0 *  Have you tolerated food without any problems? Yes.    Have you been able to return to your normal activities? Yes.    Do you have any questions about your discharge instructions: Diet   No. Medications  No. Follow up visit  No.  Do you have questions or concerns about your Care? No.  Actions: * If pain score is 4 or above: No action needed, pain <4.

## 2023-12-26 ENCOUNTER — Ambulatory Visit: Payer: Medicare PPO | Admitting: Family Medicine

## 2023-12-26 ENCOUNTER — Encounter: Payer: Self-pay | Admitting: Family Medicine

## 2023-12-26 VITALS — BP 137/78 | HR 70 | Temp 96.7°F | Ht 67.0 in | Wt 188.6 lb

## 2023-12-26 DIAGNOSIS — G3184 Mild cognitive impairment, so stated: Secondary | ICD-10-CM

## 2023-12-26 DIAGNOSIS — I700 Atherosclerosis of aorta: Secondary | ICD-10-CM

## 2023-12-26 DIAGNOSIS — E782 Mixed hyperlipidemia: Secondary | ICD-10-CM | POA: Diagnosis not present

## 2023-12-26 DIAGNOSIS — I2510 Atherosclerotic heart disease of native coronary artery without angina pectoris: Secondary | ICD-10-CM | POA: Diagnosis not present

## 2023-12-26 DIAGNOSIS — I10 Essential (primary) hypertension: Secondary | ICD-10-CM | POA: Diagnosis not present

## 2023-12-26 DIAGNOSIS — F419 Anxiety disorder, unspecified: Secondary | ICD-10-CM | POA: Diagnosis not present

## 2023-12-26 MED ORDER — DONEPEZIL HCL 10 MG PO TABS: 10.0000 mg | ORAL_TABLET | Freq: Every day | ORAL | 3 refills | Status: DC

## 2023-12-26 MED ORDER — OMEGA-3-ACID ETHYL ESTERS 1 G PO CAPS: ORAL_CAPSULE | ORAL | 3 refills | Status: DC

## 2023-12-26 NOTE — Progress Notes (Signed)
 BP 137/78   Pulse 70   Temp (!) 96.7 F (35.9 C)   Ht 5' 7 (1.702 m)   Wt 188 lb 9.6 oz (85.5 kg)   SpO2 (!) 89%   BMI 29.54 kg/m    Subjective:   Patient ID: Shelly Bennett, female    DOB: 27-Aug-1951, 73 y.o.   MRN: [id number]  HPI: Shelly Bennett is a 73 y.o. female presenting on 12/26/2023 for Medical Management of Chronic Issues, Hyperlipidemia, and Hypertension   HPI Hypertension Patient is currently on amlodipine  and hydrochlorothiazide , and their blood pressure today is 137/78. Patient denies any lightheadedness or dizziness. Patient denies headaches, blurred vision, chest pains, shortness of breath, or weakness. Denies any side effects from medication and is content with current medication.   Hyperlipidemia Patient is coming in for recheck of his hyperlipidemia. The patient is currently taking fish oils and Crestor . They deny any issues with myalgias or history of liver damage from it. They deny any focal numbness or weakness or chest pain.   Patient sees neurology for parkinsonism and dementia and cognitive impairment.  Relevant past medical, surgical, family and social history reviewed and updated as indicated. Interim medical history since our last visit reviewed. Allergies and medications reviewed and updated.  Review of Systems  Constitutional:  Negative for chills and fever.  HENT:  Positive for congestion. Negative for ear discharge and ear pain.   Eyes:  Negative for redness and visual disturbance.  Respiratory:  Negative for cough, chest tightness and shortness of breath.   Cardiovascular:  Negative for chest pain and leg swelling.  Genitourinary:  Negative for difficulty urinating and dysuria.  Skin:  Negative for rash.  Neurological:  Positive for weakness. Negative for light-headedness and headaches.  Psychiatric/Behavioral:  Positive for confusion and decreased concentration. Negative for agitation, behavioral problems and dysphoric mood. The  patient is not nervous/anxious.   All other systems reviewed and are negative.   Per HPI unless specifically indicated above   Allergies as of 12/26/2023       Reactions   Neomycin-polymyxin-dexameth    Other reaction(s): eye redness   Codeine     ? Reaction ER visit.    Mold Extract [trichophyton]    Migraine   Neomycin    Penicillins Other (See Comments)   drew my legs up as child Pt has tolerated cephalexin  in the past.   Simvastatin    Ace Inhibitors Cough   Angiotensin Receptor Blockers Cough   Levaquin  [levofloxacin ] Rash   Per notes from outpatient provider   Vytorin [ezetimibe-simvastatin] Other (See Comments)   cramping   Zetia [ezetimibe] Other (See Comments)   cramping        Medication List        Accurate as of December 26, 2023 10:37 AM. If you have any questions, ask your nurse or doctor.          amLODipine  10 MG tablet Commonly known as: NORVASC  TAKE ONE TABLET DAILY   aspirin  81 MG tablet Take 81 mg by mouth daily.   buPROPion  150 MG 24 hr tablet Commonly known as: WELLBUTRIN  XL Take 1 tablet (150 mg total) by mouth daily.   calcium  carbonate 1500 (600 Ca) MG Tabs tablet Commonly known as: OSCAL Take 600 mg of elemental calcium  by mouth daily.   Carbidopa -Levodopa  ER 25-100 MG tablet controlled release Commonly known as: SINEMET  CR Take 1 tablet by mouth 3 (three) times daily.   diclofenac  50 MG EC tablet Commonly  known as: VOLTAREN  Take 1 tablet (50 mg total) by mouth 2 (two) times daily.   donepezil  10 MG tablet Commonly known as: ARICEPT  Take 1 tablet (10 mg total) by mouth at bedtime.   hydrochlorothiazide  25 MG tablet Commonly known as: HYDRODIURIL  Take 1 tablet (25 mg total) by mouth daily. TAKE 1/2 TABLET DAILY What changed: additional instructions   memantine  10 MG tablet Commonly known as: NAMENDA  Take 1 tablet (10 mg total) by mouth 2 (two) times daily.   montelukast  10 MG tablet Commonly known as: SINGULAIR  Take  1 tablet (10 mg total) by mouth at bedtime.   omega-3 acid ethyl esters 1 g capsule Commonly known as: LOVAZA  TAKE TWO CAPSULES TWICE DAILY What changed: See the new instructions.   polyethylene glycol 17 g packet Commonly known as: MIRALAX  / GLYCOLAX  Take 17 g by mouth 2 (two) times daily. What changed:  when to take this reasons to take this   rosuvastatin  20 MG tablet Commonly known as: CRESTOR  Take 1 tablet (20 mg total) by mouth daily.   senna-docusate 8.6-50 MG tablet Commonly known as: Senokot-S Take 1 tablet by mouth 2 (two) times daily. What changed:  when to take this reasons to take this   Vitamin D -3 125 MCG (5000 UT) Tabs Take 1 capsule by mouth See admin instructions. Take one capsule by mouth daily Mon thru Friday         Objective:   BP 137/78   Pulse 70   Temp (!) 96.7 F (35.9 C)   Ht 5' 7 (1.702 m)   Wt 188 lb 9.6 oz (85.5 kg)   SpO2 (!) 89%   BMI 29.54 kg/m   Wt Readings from Last 3 Encounters:  12/26/23 188 lb 9.6 oz (85.5 kg)  11/21/23 191 lb (86.6 kg)  10/31/23 191 lb (86.6 kg)    Physical Exam Vitals and nursing note reviewed.  Constitutional:      General: She is not in acute distress.    Appearance: Normal appearance. She is well-developed. She is not diaphoretic.  Eyes:     Conjunctiva/sclera: Conjunctivae normal.     Pupils: Pupils are equal, round, and reactive to light.  Cardiovascular:     Rate and Rhythm: Normal rate and regular rhythm.     Heart sounds: Normal heart sounds. No murmur heard. Pulmonary:     Effort: Pulmonary effort is normal. No respiratory distress.     Breath sounds: Normal breath sounds. No wheezing.  Musculoskeletal:        General: No tenderness. Normal range of motion.  Skin:    General: Skin is warm and dry.     Findings: No rash.  Neurological:     Mental Status: She is alert and oriented to person, place, and time.     Coordination: Coordination normal.  Psychiatric:        Mood and  Affect: Mood is not anxious or depressed.        Behavior: Behavior normal.        Thought Content: Thought content does not include suicidal ideation. Thought content does not include suicidal plan.        Cognition and Memory: She exhibits impaired recent memory.       Assessment & Plan:   Problem List Items Addressed This Visit       Cardiovascular and Mediastinum   Hypertension   Relevant Medications   omega-3 acid ethyl esters (LOVAZA ) 1 g capsule   Other Relevant Orders  CMP14+EGFR   Lipid panel   Aortic atherosclerosis (HCC)   Relevant Medications   omega-3 acid ethyl esters (LOVAZA ) 1 g capsule   CAD (coronary artery disease), native coronary artery   Relevant Medications   omega-3 acid ethyl esters (LOVAZA ) 1 g capsule   Other Relevant Orders   CBC with Differential/Platelet     Other   Hyperlipemia - Primary   Relevant Medications   omega-3 acid ethyl esters (LOVAZA ) 1 g capsule   Other Relevant Orders   CBC with Differential/Platelet   CMP14+EGFR   Lipid panel   Anxiety   Other Visit Diagnoses       Mild cognitive impairment       Relevant Medications   donepezil  (ARICEPT ) 10 MG tablet       Continue current medicine, seems to be doing well from our standpoint.  Neurodegenerative really and strength she is becoming weaker high risk for falls and family is here with her trying to encourage increased movement.  They do want to consider physical therapy this year but do not want to do it yet because they do not want to use it up in case she needs it later. Follow up plan: Return in about 6 months (around 06/24/2024), or if symptoms worsen or fail to improve, for Hypertension and hyperlipidemia.  Counseling provided for all of the vaccine components Orders Placed This Encounter  Procedures   CBC with Differential/Platelet   CMP14+EGFR   Lipid panel    Fonda Levins, MD [facility] 12/26/2023, 10:37 AM

## 2023-12-27 LAB — CMP14+EGFR
ALT: 15 [IU]/L (ref 0–32)
AST: 18 [IU]/L (ref 0–40)
Albumin: 4.4 g/dL (ref 3.8–4.8)
Alkaline Phosphatase: 84 [IU]/L (ref 44–121)
BUN/Creatinine Ratio: 19 (ref 12–28)
BUN: 17 mg/dL (ref 8–27)
Bilirubin Total: 0.4 mg/dL (ref 0.0–1.2)
CO2: 25 mmol/L (ref 20–29)
Calcium: 10.1 mg/dL (ref 8.7–10.3)
Chloride: 101 mmol/L (ref 96–106)
Creatinine, Ser: 0.91 mg/dL (ref 0.57–1.00)
Globulin, Total: 2.1 g/dL (ref 1.5–4.5)
Glucose: 72 mg/dL (ref 70–99)
Potassium: 3.9 mmol/L (ref 3.5–5.2)
Sodium: 141 mmol/L (ref 134–144)
Total Protein: 6.5 g/dL (ref 6.0–8.5)
eGFR: 67 mL/min/{1.73_m2} (ref >59)

## 2023-12-27 LAB — CBC WITH DIFFERENTIAL/PLATELET
Basophils Absolute: 0 10*3/uL (ref 0.0–0.2)
Basos: 1 %
EOS (ABSOLUTE): 0.1 10*3/uL (ref 0.0–0.4)
Eos: 1 %
Hematocrit: 42 % (ref 34.0–46.6)
Hemoglobin: 13.6 g/dL (ref 11.1–15.9)
Immature Grans (Abs): 0 10*3/uL (ref 0.0–0.1)
Immature Granulocytes: 0 %
Lymphocytes Absolute: 1.3 10*3/uL (ref 0.7–3.1)
Lymphs: 19 %
MCH: 30.8 pg (ref 26.6–33.0)
MCHC: 32.4 g/dL (ref 31.5–35.7)
MCV: 95 fL (ref 79–97)
Monocytes Absolute: 0.5 10*3/uL (ref 0.1–0.9)
Monocytes: 7 %
Neutrophils Absolute: 5.1 10*3/uL (ref 1.4–7.0)
Neutrophils: 72 %
Platelets: 278 10*3/uL (ref 150–450)
RBC: 4.41 x10E6/uL (ref 3.77–5.28)
RDW: 12.8 % (ref 11.7–15.4)
WBC: 7.1 10*3/uL (ref 3.4–10.8)

## 2023-12-27 LAB — LIPID PANEL
Chol/HDL Ratio: 2.6 {ratio} (ref 0.0–4.4)
Cholesterol, Total: 176 mg/dL (ref 100–199)
HDL: 68 mg/dL (ref >39)
LDL Chol Calc (NIH): 84 mg/dL (ref 0–99)
Triglycerides: 142 mg/dL (ref 0–149)
VLDL Cholesterol Cal: 24 mg/dL (ref 5–40)

## 2024-01-02 ENCOUNTER — Encounter: Payer: Self-pay | Admitting: Cardiovascular Disease

## 2024-01-02 ENCOUNTER — Encounter: Payer: Self-pay | Admitting: Family Medicine

## 2024-01-02 ENCOUNTER — Ambulatory Visit: Payer: Medicare PPO | Attending: Cardiovascular Disease | Admitting: Cardiovascular Disease

## 2024-01-02 VITALS — BP 130/70 | HR 63 | Ht 68.5 in | Wt 188.8 lb

## 2024-01-02 DIAGNOSIS — I2510 Atherosclerotic heart disease of native coronary artery without angina pectoris: Secondary | ICD-10-CM | POA: Diagnosis not present

## 2024-01-02 DIAGNOSIS — E782 Mixed hyperlipidemia: Secondary | ICD-10-CM

## 2024-01-02 DIAGNOSIS — I358 Other nonrheumatic aortic valve disorders: Secondary | ICD-10-CM

## 2024-01-02 DIAGNOSIS — I10 Essential (primary) hypertension: Secondary | ICD-10-CM

## 2024-01-02 NOTE — Patient Instructions (Signed)

## 2024-01-02 NOTE — Assessment & Plan Note (Signed)
Stable with no symptoms of angina.  Patient noted to have diffuse nonobstructive plaquing from coronary CT-FFR in 2020.  At that time FFR of all of her vessels was normal.  She was noted to have calcified and noncalcified plaque with Agatston score placing her in the 91st percentile

## 2024-01-02 NOTE — Progress Notes (Signed)
Cardiology Office Note:    Date:  01/02/2024   ID:  Shelly Bennett, DOB [birth year], MRN [id number]  PCP:  Dettinger, Elige Radon, MD   [facility] Providers Cardiologist:  Tonny Bollman, MD     Referring MD: Dettinger, Elige Radon, MD   No chief complaint on file.   History of Present Illness:    Shelly Bennett is a 73 y.o. female with a hx of nonobstructive coronary artery disease, hypertension, and bicuspid aortic valve, presenting for follow-up evaluation.  The patient is here with her husband today.  She continues to struggle with leg weakness and gait instability.  She is not able to walk on her own anymore.  She uses a walker in the house and has to be in a wheelchair mostly outside of the house.  She has not had any cardiac-related problems.  She denies chest pain, chest pressure, edema, orthopnea, PND, heart palpitations, or shortness of breath.  She had an echocardiogram done at [facility] on August 05, 2023.  This demonstrated normal left ventricular function with LVEF greater than 70% and no significant valvular disease.  She was noted to have some aortic sclerosis without stenosis.  Current Medications: Current Meds  Medication Sig   amLODipine (NORVASC) 10 MG tablet TAKE ONE TABLET DAILY   aspirin 81 MG tablet Take 81 mg by mouth daily.    buPROPion (WELLBUTRIN XL) 150 MG 24 hr tablet Take 1 tablet (150 mg total) by mouth daily.   calcium carbonate (OSCAL) 1500 (600 Ca) MG TABS tablet Take 600 mg of elemental calcium by mouth daily.   Carbidopa-Levodopa ER (SINEMET CR) 25-100 MG tablet controlled release Take 1 tablet by mouth 3 (three) times daily.   Cholecalciferol (VITAMIN D-3) 5000 UNITS TABS Take 1 capsule by mouth See admin instructions. Take one capsule by mouth daily Mon thru Friday   diclofenac (VOLTAREN) 50 MG EC tablet Take 1 tablet (50 mg total) by mouth 2 (two) times daily.   donepezil (ARICEPT) 10 MG tablet Take 1 tablet (10 mg  total) by mouth at bedtime.   hydrochlorothiazide (HYDRODIURIL) 25 MG tablet Take 25 mg by mouth daily.   memantine (NAMENDA) 10 MG tablet Take 1 tablet (10 mg total) by mouth 2 (two) times daily.   montelukast (SINGULAIR) 10 MG tablet Take 1 tablet (10 mg total) by mouth at bedtime.   omega-3 acid ethyl esters (LOVAZA) 1 g capsule TAKE TWO CAPSULES TWICE DAILY   polyethylene glycol (MIRALAX / GLYCOLAX) 17 g packet Take 17 g by mouth 2 (two) times daily. (Patient taking differently: Take 17 g by mouth daily as needed.)   rosuvastatin (CRESTOR) 20 MG tablet Take 1 tablet (20 mg total) by mouth daily.   senna-docusate (SENOKOT-S) 8.6-50 MG tablet Take 1 tablet by mouth 2 (two) times daily. (Patient taking differently: Take 1 tablet by mouth daily as needed.)   [DISCONTINUED] hydrochlorothiazide (HYDRODIURIL) 25 MG tablet Take 1 tablet (25 mg total) by mouth daily. TAKE 1/2 TABLET DAILY (Patient taking differently: Take 25 mg by mouth daily.)     Allergies:   Neomycin-polymyxin-dexameth, Codeine, Mold extract [trichophyton], Neomycin, Penicillins, Simvastatin, Ace inhibitors, Angiotensin receptor blockers, Levaquin [levofloxacin], Vytorin [ezetimibe-simvastatin], and Zetia [ezetimibe]   ROS:   Please see the history of present illness.    All other systems reviewed and are negative.  EKGs/Labs/Other Studies Reviewed:    The following studies were reviewed today: Cardiac Studies & Procedures     STRESS TESTS  ECHOCARDIOGRAM STRESS TEST 07/16/2017  Narrative *[facility] [address] [place], [place] [address] [phone]  ------------------------------------------------------------------- Stress Echocardiography  Patient:    Shelly, Bennett MR #:       [id number] Study Date: 07/16/2017 Gender:     F Age:        25 Height:     172.7 cm Weight:     83.6 kg BSA:        2.02 m^2 Pt. Status: Room:  ATTENDING    Tonny Bollman, MD ORDERING     Tonny Bollman,  MD REFERRING    Tonny Bollman, MD SONOGRAPHER  Clearence Ped, RCS PERFORMING   [facility] Outpatient  cc: Dr. Excell Seltzer  -------------------------------------------------------------------  ------------------------------------------------------------------- Indications:      CAD (I25.119).  ------------------------------------------------------------------- History:   PMH:  Bradycardia, HLD, Aortic stenosis.  ------------------------------------------------------------------- Study Conclusions  - Stress ECG conclusions: The stress ECG was normal. - Staged echo: There was no echocardiographic evidence for stress-induced ischemia.  Impressions:  - Stress echocardiogram with no chest pain and no ST changes; hypertensive BP response; no stress-induced wall motion abnormalities; normal stress echocardiogram.  ------------------------------------------------------------------- Study data:   Study status:  Routine.  Consent:  The risks, benefits, and alternatives to the procedure were explained to the patient and informed consent was obtained.  Procedure:  The patient reported no pain pre or post test. Initial setup. The patient was brought to the laboratory. A baseline ECG was recorded. Surface ECG leads and automatic cuff blood pressure measurements were monitored. Treadmill exercise testing was performed using the Bruce protocol. The patient exercised for 6 min, to protocol stage 2, to a maximal work rate of 7 mets. Exercise was terminated due to achievement of target heart rate and dyspnea. The patient was positioned for image acquisition and recovery monitoring. Transthoracic stress echocardiography for CAD. Image quality was adequate. Images were captured at baseline and peak exercise. Study completion:  The patient tolerated the procedure well. There were no complications.          Bruce protocol. Stress echocardiography.  Birthdate:  Patient birthdate: 1951-10-19.  Age: Patient  is 73 yr old.  Sex:  Gender: female.    BMI: 28 kg/m^2. Blood pressure:     133/90  Patient status:  Outpatient.  Study date:  Study date: 07/16/2017. Study time: 07:36 AM.  -------------------------------------------------------------------  ------------------------------------------------------------------- Baseline ECG:  Normal sinus rhythm, poor R wave progression.  ------------------------------------------------------------------- Stress protocol:  +---------------------+---+------------+----------------+--------+ !Stage                !HR !BP (mmHg)   !Symptoms        !Comments! +---------------------+---+------------+----------------+--------+ !Baseline             !61 !133/90 (104)!None            !--------! +---------------------+---+------------+----------------+--------+ !Stage 1              !213!086/57 (126)!----------------!--------! +---------------------+---+------------+----------------+--------+ !Stage 2              !846!962/95 (125)!Dyspnea, fatigue!RPE=18. ! +---------------------+---+------------+----------------+--------+ !Stage 3              !---!------------!----------------!PAC&'s.  ! +---------------------+---+------------+----------------+--------+ !Immediate post stress!139!------------!----------------!--------! +---------------------+---+------------+----------------+--------+ !Recovery; 1 min      !108!201/60 (107)!----------------!--------! +---------------------+---+------------+----------------+--------+ !Recovery; 2 min      !87 !------------!----------------!--------! +---------------------+---+------------+----------------+--------+ !Recovery; 3 min      !75 !155/52 (86) !----------------!--------! +---------------------+---+------------+----------------+--------+ !Recovery; 4 min      !74 !------------!----------------!--------! +---------------------+---+------------+----------------+--------+ !Recovery; 5 min      !66 !132/52 (  79)  !----------------!--------! +---------------------+---+------------+----------------+--------+ !Late recovery        !68 !------------!----------------!--------! +---------------------+---+------------+----------------+--------+  ------------------------------------------------------------------- Stress results:   Maximal heart rate during stress was 139 bpm (90% of maximal predicted heart rate). The maximal predicted heart rate was 154 bpm.The target heart rate was achieved. The heart rate response to stress was normal. There was a normal resting blood pressure with a hypertensive response to stress. The rate-pressure product for the peak heart rate and blood pressure was 96295 mm Hg/min.  The patient experienced no chest pain during stress.  ------------------------------------------------------------------- Stress ECG:   Atrial ectopy.  The stress ECG was normal.  ------------------------------------------------------------------- Baseline:  - LV size was normal. - LV global systolic function was normal. The estimated LV ejection fraction was 65%. - Normal wall motion; no LV regional wall motion abnormalities.  Peak stress:  - LV size was reduced appropriately. - LV global systolic function was hyperdynamic. The estimated LV ejection fraction was 85%. - No evidence for new LV regional wall motion abnormalities.  ------------------------------------------------------------------- Stress echo results:     Left ventricular ejection fraction was normal at rest and with stress. There was no echocardiographic evidence for stress-induced ischemia.  ------------------------------------------------------------------- Prepared and Electronically Authenticated by  Olga Millers 2018-07-31T12:06:55  ECHOCARDIOGRAM  ECHOCARDIOGRAM COMPLETE 08/28/2021  Narrative ECHOCARDIOGRAM REPORT    Patient Name:   Karleen [facility] Date of Exam: 08/28/2021 Medical Rec #:  [id number]              Height:       68.0 in Accession #:    [id number]            Weight:       173.5 lb Date of Birth:  1951/04/18             BSA:          1.924 m Patient Age:    70 years              BP:           163/83 mmHg Patient Gender: F                     HR:           57 bpm. Exam Location:  [address]  Procedure: 2D Echo, 3D Echo, Cardiac Doppler and Color Doppler  Indications:    I35.9 Aortic Valve Disorder  History:        Patient has prior history of Echocardiogram examinations, most recent 09/03/2019. CAD; Risk Factors:Hypertension and HLD.  Sonographer:    Clearence Ped RCS Referring Phys: [phone] Olivianna Higley  IMPRESSIONS   1. Left ventricular ejection fraction, by estimation, is 60 to 65%. The left ventricle has normal function. The left ventricle has no regional wall motion abnormalities. Left ventricular diastolic parameters are consistent with Grade I diastolic dysfunction (impaired relaxation). 2. Right ventricular systolic function is normal. The right ventricular size is normal. There is normal pulmonary artery systolic pressure. The estimated right ventricular systolic pressure is 23.2 mmHg. 3. The mitral valve is normal in structure. Trivial mitral valve regurgitation. No evidence of mitral stenosis. 4. The aortic valve is tricuspid. Aortic valve regurgitation is not visualized. Mild aortic valve stenosis. Aortic valve area, by VTI measures 1.54 cm. Aortic valve mean gradient measures 10.0 mmHg. 5. The inferior vena cava is normal in size with greater than 50% respiratory variability, suggesting right atrial pressure of 3 mmHg.  FINDINGS Left Ventricle: Left ventricular ejection  fraction, by estimation, is 60 to 65%. The left ventricle has normal function. The left ventricle has no regional wall motion abnormalities. The left ventricular internal cavity size was normal in size. There is no left ventricular hypertrophy. Left ventricular diastolic parameters are  consistent with Grade I diastolic dysfunction (impaired relaxation).  Right Ventricle: The right ventricular size is normal. No increase in right ventricular wall thickness. Right ventricular systolic function is normal. There is normal pulmonary artery systolic pressure. The tricuspid regurgitant velocity is 2.25 m/s, and with an assumed right atrial pressure of 3 mmHg, the estimated right ventricular systolic pressure is 23.2 mmHg.  Left Atrium: Left atrial size was normal in size.  Right Atrium: Right atrial size was normal in size.  Pericardium: There is no evidence of pericardial effusion.  Mitral Valve: The mitral valve is normal in structure. There is mild calcification of the mitral valve leaflet(s). Trivial mitral valve regurgitation. No evidence of mitral valve stenosis.  Tricuspid Valve: The tricuspid valve is normal in structure. Tricuspid valve regurgitation is trivial.  Aortic Valve: The aortic valve is tricuspid. Aortic valve regurgitation is not visualized. Mild aortic stenosis is present. Aortic valve mean gradient measures 10.0 mmHg. Aortic valve peak gradient measures 15.1 mmHg. Aortic valve area, by VTI measures 1.54 cm.  Pulmonic Valve: The pulmonic valve was normal in structure. Pulmonic valve regurgitation is not visualized.  Aorta: The aortic root is normal in size and structure.  Venous: The inferior vena cava is normal in size with greater than 50% respiratory variability, suggesting right atrial pressure of 3 mmHg.  IAS/Shunts: No atrial level shunt detected by color flow Doppler.   LEFT VENTRICLE PLAX 2D LVIDd:         4.50 cm  Diastology LVIDs:         2.30 cm  LV e' medial:    6.31 cm/s LV PW:         0.80 cm  LV E/e' medial:  14.8 LV IVS:        1.00 cm  LV e' lateral:   9.36 cm/s LVOT diam:     1.90 cm  LV E/e' lateral: 10.0 LV SV:         80 LV SV Index:   42 LVOT Area:     2.84 cm  3D Volume EF: 3D EF:        63 % LV EDV:       121 ml LV  ESV:       45 ml LV SV:        76 ml  RIGHT VENTRICLE RV Basal diam:  3.40 cm RV S prime:     13.80 cm/s TAPSE (M-mode): 2.2 cm RVSP:           23.2 mmHg  LEFT ATRIUM             Index       RIGHT ATRIUM           Index LA diam:        4.00 cm 2.08 cm/m  RA Pressure: 3.00 mmHg LA Vol (A2C):   58.5 ml 30.41 ml/m RA Area:     14.00 cm LA Vol (A4C):   47.5 ml 24.69 ml/m RA Volume:   32.50 ml  16.89 ml/m LA Biplane Vol: 54.0 ml 28.07 ml/m AORTIC VALVE AV Area (Vmax):    1.67 cm AV Area (Vmean):   1.49 cm AV Area (VTI):     1.54 cm AV  Vmax:           194.00 cm/s AV Vmean:          151.000 cm/s AV VTI:            0.522 m AV Peak Grad:      15.1 mmHg AV Mean Grad:      10.0 mmHg LVOT Vmax:         114.00 cm/s LVOT Vmean:        79.100 cm/s LVOT VTI:          0.283 m LVOT/AV VTI ratio: 0.54  AORTA Ao Root diam: 2.90 cm Ao Asc diam:  3.00 cm  MITRAL VALVE                TRICUSPID VALVE MV Area (PHT):              TR Peak grad:   20.2 mmHg MV Decel Time:              TR Vmax:        225.00 cm/s MV E velocity: 93.30 cm/s   Estimated RAP:  3.00 mmHg MV A velocity: 107.00 cm/s  RVSP:           23.2 mmHg MV E/A ratio:  0.87 SHUNTS Systemic VTI:  0.28 m Systemic Diam: 1.90 cm  Dalton McleanMD Electronically signed by Wilfred Lacy Signature Date/Time: 08/28/2021/5:10:30 PM    Final    CT SCANS  CT CORONARY FRACTIONAL FLOW RESERVE DATA PREP 09/15/2019  Narrative CLINICAL DATA:  Chest pain  EXAM: CT FFR  MEDICATIONS: No additional medications.  TECHNIQUE: The coronary CTA was sent for FFR.  FINDINGS: FFR 0.95 mid D1  FFR 0.88 distal LAD  FFR 0.91 mid LCx  IMPRESSION: No evidence for hemodynamically significant coronary disease.  Dalton Mclean   Electronically Signed By: Marca Ancona M.D. On: 09/16/2019 22:54   CT SCANS  CT CORONARY MORPH W/CTA COR W/SCORE 09/15/2019  Addendum 09/15/2019  1:25 PM ADDENDUM REPORT: 09/15/2019  13:22  CLINICAL DATA:  Chest pain  EXAM: Cardiac CTA  MEDICATIONS: Sub lingual nitro. 4mg  x 2  TECHNIQUE: The patient was scanned on a Siemens 192 slice scanner. Gantry rotation speed was 250 msecs. Collimation was 0.6 mm. A 100 kV prospective scan was triggered in the ascending thoracic aorta at 35-75% of the R-R interval. Average HR during the scan was 60 bpm. The 3D data set was interpreted on a dedicated work station using MPR, MIP and VRT modes. A total of 80cc of contrast was used.  FINDINGS: Non-cardiac: See separate report from [facility].  Mildly calcified aortic valve. Pulmonary veins drain normally to the left atrium.  Calcium Score: 414 Agatston units.  Coronary Arteries: Right dominant with no anomalies  LM: No plaque or stenosis.  LAD system: Mixed plaque in the proximal LAD, mild (<50%) stenosis. Moderate D1 with calcified plaque in the mid-vessel, suspect mild (<50%) stenosis.  Circumflex system: Mixed plaque ostial LCx, mild (<50%) stenosis.  RCA system: Calcified plaque noted in proximal, mid and distal RCA. Minimal stenosis.  IMPRESSION: 1. Coronary artery calcium score 414 Agatston units. This places the patient in the 91st percentile for age and gender, suggesting high risk for future cardiac events.  2. Extensive plaque but suspect no more than mild stenosis. Will send FFR just to check on the ostial LCx and D1.  Dalton Mclean   Electronically Signed By: Marca Ancona M.D. On: 09/15/2019 13:22  Narrative EXAM: OVER-READ  INTERPRETATION  CT CHEST  The following report is an over-read performed by radiologist Dr. Charlett Nose of [facility] on 09/15/2019. This over-read does not include interpretation of cardiac or coronary anatomy or pathology. The coronary CTA interpretation by the cardiologist is attached.  COMPARISON:  None.  FINDINGS: Vascular: Heart is normal size. Visualized aorta normal caliber  with scattered aortic calcifications.  Mediastinum/Nodes: No adenopathy in the lower mediastinum or hila. Small hiatal hernia.  Lungs/Pleura: Visualized lungs clear.  No effusions.  Upper Abdomen: Imaging into the upper abdomen shows no acute findings.  Musculoskeletal: Chest wall soft tissues are unremarkable. No acute bony abnormality.  IMPRESSION: No acute extra cardiac abnormality.  Scattered aortic atherosclerosis.  Electronically Signed: By: Charlett Nose M.D. On: 09/15/2019 13:01          EKG:        Recent Labs: 03/13/2023: Magnesium 2.2 12/26/2023: ALT 15; BUN 17; Creatinine, Ser 0.91; Hemoglobin 13.6; Platelets 278; Potassium 3.9; Sodium 141  Recent Lipid Panel    Component Value Date/Time   CHOL 176 12/26/2023 1051   TRIG 142 12/26/2023 1051   TRIG 116 05/01/2017 0958   HDL 68 12/26/2023 1051   HDL 64 05/01/2017 0958   CHOLHDL 2.6 12/26/2023 1051   LDLCALC 84 12/26/2023 1051   LDLCALC 74 08/19/2014 0920     Risk Assessment/Calculations:                Physical Exam:    VS:  BP 130/70   Pulse 63   Ht 5' 8.5" (1.74 m)   Wt 188 lb 12.8 oz (85.6 kg)   SpO2 95%   BMI 28.29 kg/m     Wt Readings from Last 3 Encounters:  01/02/24 188 lb 12.8 oz (85.6 kg)  12/26/23 188 lb 9.6 oz (85.5 kg)  11/21/23 191 lb (86.6 kg)     GEN:  Well nourished, well developed in a wheelchair, in no acute distress HEENT: Normal NECK: No JVD; No carotid bruits LYMPHATICS: No lymphadenopathy CARDIAC: RRR, 2/6 systolic ejection murmur at the right upper sternal border RESPIRATORY:  Clear to auscultation without rales, wheezing or rhonchi  ABDOMEN: Soft, non-tender, non-distended MUSCULOSKELETAL:  No edema; No deformity  SKIN: Warm and dry NEUROLOGIC:  Alert and oriented x 3 PSYCHIATRIC:  Normal affect   Assessment & Plan Essential hypertension Blood pressure well-controlled on amlodipine and hydrochlorothiazide.  Recent labs reviewed with normal renal function  creatinine 0.91 and potassium 3.9. Mixed hyperlipidemia Treated with rosuvastatin 20 mg daily.  Labs with a cholesterol 176, HDL 68, LDL 84. Coronary artery disease involving native coronary artery of native heart without angina pectoris Stable with no symptoms of angina.  Patient noted to have diffuse nonobstructive plaquing from coronary CT-FFR in 2020.  At that time FFR of all of her vessels was normal.  She was noted to have calcified and noncalcified plaque with Agatston score placing her in the 91st percentile Aortic valve sclerosis Exam remains consistent with aortic sclerosis.  Echo reviewed from this past year with no evidence of aortic stenosis.  There has been some discrepancy about whether she has a bicuspid or tricuspid aortic valve.  All of her recent echoes have suggested a tricuspid aortic valve.       Medication Adjustments/Labs and Tests Ordered: Current medicines are reviewed at length with the patient today.  Concerns regarding medicines are outlined above.  No orders of the defined types were placed in this encounter.  No orders of the  defined types were placed in this encounter.   Patient Instructions  Follow-Up: At [facility], you and your health needs are our priority.  As part of our continuing mission to provide you with exceptional heart care, we have created designated Provider Care Teams.  These Care Teams include your primary Cardiologist (physician) and Advanced Practice Providers (APPs -  Physician Assistants and Nurse Practitioners) who all work together to provide you with the care you need, when you need it.  Your next appointment:   1 year(s)  Provider:   Tonny Bollman, MD             Signed, Tonny Bollman, MD  01/02/2024 3:23 PM    [facility]

## 2024-01-02 NOTE — Assessment & Plan Note (Signed)
Treated with rosuvastatin 20 mg daily.  Labs with a cholesterol 176, HDL 68, LDL 84.

## 2024-01-10 DIAGNOSIS — G3183 Dementia with Lewy bodies: Secondary | ICD-10-CM | POA: Diagnosis not present

## 2024-01-10 DIAGNOSIS — G309 Alzheimer's disease, unspecified: Secondary | ICD-10-CM | POA: Diagnosis not present

## 2024-01-10 DIAGNOSIS — F0280 Dementia in other diseases classified elsewhere without behavioral disturbance: Secondary | ICD-10-CM | POA: Diagnosis not present

## 2024-01-10 DIAGNOSIS — R2689 Other abnormalities of gait and mobility: Secondary | ICD-10-CM | POA: Diagnosis not present

## 2024-01-10 DIAGNOSIS — R269 Unspecified abnormalities of gait and mobility: Secondary | ICD-10-CM | POA: Diagnosis not present

## 2024-02-11 ENCOUNTER — Other Ambulatory Visit: Payer: Self-pay | Admitting: Cardiovascular Disease

## 2024-02-11 ENCOUNTER — Other Ambulatory Visit: Payer: Self-pay | Admitting: Neurology

## 2024-02-11 DIAGNOSIS — I10 Essential (primary) hypertension: Secondary | ICD-10-CM

## 2024-02-11 DIAGNOSIS — I2510 Atherosclerotic heart disease of native coronary artery without angina pectoris: Secondary | ICD-10-CM

## 2024-02-11 DIAGNOSIS — Q2381 Bicuspid aortic valve: Secondary | ICD-10-CM

## 2024-02-11 DIAGNOSIS — E782 Mixed hyperlipidemia: Secondary | ICD-10-CM

## 2024-03-03 DIAGNOSIS — F0280 Dementia in other diseases classified elsewhere without behavioral disturbance: Secondary | ICD-10-CM | POA: Diagnosis not present

## 2024-03-03 DIAGNOSIS — G309 Alzheimer's disease, unspecified: Secondary | ICD-10-CM | POA: Diagnosis not present

## 2024-03-24 DIAGNOSIS — G309 Alzheimer's disease, unspecified: Secondary | ICD-10-CM | POA: Diagnosis not present

## 2024-04-24 DIAGNOSIS — L538 Other specified erythematous conditions: Secondary | ICD-10-CM | POA: Diagnosis not present

## 2024-04-24 DIAGNOSIS — F02A0 Dementia in other diseases classified elsewhere, mild, without behavioral disturbance, psychotic disturbance, mood disturbance, and anxiety: Secondary | ICD-10-CM | POA: Diagnosis not present

## 2024-04-24 DIAGNOSIS — L739 Follicular disorder, unspecified: Secondary | ICD-10-CM | POA: Diagnosis not present

## 2024-04-24 DIAGNOSIS — R269 Unspecified abnormalities of gait and mobility: Secondary | ICD-10-CM | POA: Diagnosis not present

## 2024-04-24 DIAGNOSIS — L570 Actinic keratosis: Secondary | ICD-10-CM | POA: Diagnosis not present

## 2024-04-24 DIAGNOSIS — R2689 Other abnormalities of gait and mobility: Secondary | ICD-10-CM | POA: Diagnosis not present

## 2024-04-24 DIAGNOSIS — L281 Prurigo nodularis: Secondary | ICD-10-CM | POA: Diagnosis not present

## 2024-04-24 DIAGNOSIS — L821 Other seborrheic keratosis: Secondary | ICD-10-CM | POA: Diagnosis not present

## 2024-04-24 DIAGNOSIS — L820 Inflamed seborrheic keratosis: Secondary | ICD-10-CM | POA: Diagnosis not present

## 2024-04-24 DIAGNOSIS — G301 Alzheimer's disease with late onset: Secondary | ICD-10-CM | POA: Diagnosis not present

## 2024-04-27 ENCOUNTER — Encounter: Payer: Self-pay | Admitting: Family Medicine

## 2024-04-27 ENCOUNTER — Other Ambulatory Visit: Payer: Self-pay

## 2024-04-27 DIAGNOSIS — R300 Dysuria: Secondary | ICD-10-CM

## 2024-04-28 ENCOUNTER — Telehealth: Admitting: Family Medicine

## 2024-04-28 ENCOUNTER — Encounter: Payer: Self-pay | Admitting: Family Medicine

## 2024-04-28 DIAGNOSIS — R300 Dysuria: Secondary | ICD-10-CM | POA: Diagnosis not present

## 2024-04-28 MED ORDER — SULFAMETHOXAZOLE-TRIMETHOPRIM 800-160 MG PO TABS: 1.0000 | ORAL_TABLET | Freq: Two times a day (BID) | ORAL | 0 refills | Status: DC

## 2024-04-28 NOTE — Progress Notes (Signed)
 Subjective:    Patient ID: Shelly Bennett, female    DOB: 1951-04-04, 73 y.o.   MRN: [id number]   HPI: Shelly Bennett is a 73 y.o. female presenting for incontinence for 6 months.  She gets the urge to go to the bathroom and cannot hold it long enough to get to the toilet.  There has been no fever chills or sweats.  She has having some dysuria intermittently.  This week developed low back and left flank pain.       12/26/2023   10:07 AM 08/14/2023    9:06 AM 06/27/2023   10:10 AM 03/21/2023    8:55 AM 12/31/2022   10:45 AM  Depression screen PHQ 2/9  Decreased Interest 1 0 1 3 1   Down, Depressed, Hopeless 3 0 3 3 1   PHQ - 2 Score 4 0 4 6 2   Altered sleeping 0 0 0 3 2  Tired, decreased energy 2 0 3 3 1   Change in appetite 0 0 0 3 0  Feeling bad or failure about yourself  3 0 2 3 0  Trouble concentrating 0 0 0 3 0  Moving slowly or fidgety/restless 0 0 0 3 0  Suicidal thoughts 1 0 1 3 0  PHQ-9 Score 10 0 10 27 5   Difficult doing work/chores Very difficult Not difficult at all Somewhat difficult Extremely dIfficult Somewhat difficult     Relevant past medical, surgical, family and social history reviewed and updated as indicated.  Interim medical history since our last visit reviewed. Allergies and medications reviewed and updated.  ROS:  Review of Systems  Constitutional: Negative.   HENT: Negative.    Eyes:  Negative for visual disturbance.  Respiratory:  Negative for shortness of breath.   Cardiovascular:  Negative for chest pain.  Gastrointestinal:  Negative for abdominal pain.  Musculoskeletal:  Negative for arthralgias.     Social History   Tobacco Use  Smoking Status Never  Smokeless Tobacco Never       Objective:      Wt Readings from Last 3 Encounters:  01/02/24 188 lb 12.8 oz (85.6 kg)  12/26/23 188 lb 9.6 oz (85.5 kg)  11/21/23 191 lb (86.6 kg)     Exam deferred. Video visit performed.   Assessment & Plan:  Dysuria -      Urinalysis, Complete -     Urine Culture  Other orders -     Sulfamethoxazole-Trimethoprim ; Take 1 tablet by mouth 2 (two) times daily.  Dispense: 14 tablet; Refill: 0      Diagnoses and all orders for this visit:  Dysuria -     Urinalysis, Complete -     Urine Culture  Other orders -     sulfamethoxazole-trimethoprim  (BACTRIM DS) 800-160 MG tablet; Take 1 tablet by mouth 2 (two) times daily.    Virtual Visit  Note  I discussed the limitations, risks, security and privacy concerns of performing an evaluation and management service by video and the availability of in person appointments. The patient was identified with two identifiers. Pt.expressed understanding and agreed to proceed. Pt. Is at home. Dr. Veleta Gerold is in his office.  Follow Up Instructions:   I discussed the assessment and treatment plan with the patient. The patient was provided an opportunity to ask questions and all were answered. The patient agreed with the plan and demonstrated an understanding of the instructions.   The patient was advised to call back or seek an in-person evaluation  if the symptoms worsen or if the condition fails to improve as anticipated.   Total minutes contact time: 17  A urine was dropped off at the office but unfortunately was not processed and in fact was sent for processing at [facility].  When the specimen has been evaluated and resulted I will make a decision with regard to treatment.  Particularly we will consider treatment for urge incontinence if the culture is negative for infection.   Follow up plan: No follow-ups on file.  Shelly Cleaver, MD [facility]ick[facility] Gr[facility]

## 2024-04-29 LAB — URINE CULTURE

## 2024-04-29 LAB — URINALYSIS, COMPLETE
Bilirubin, UA: NEGATIVE
Glucose, UA: NEGATIVE
Ketones, UA: NEGATIVE
Nitrite, UA: NEGATIVE
Protein,UA: NEGATIVE
Specific Gravity, UA: 1.01 (ref 1.005–1.030)
Urobilinogen, Ur: 0.2 mg/dL (ref 0.2–1.0)
pH, UA: 7 (ref 5.0–7.5)

## 2024-05-03 ENCOUNTER — Ambulatory Visit: Payer: Self-pay | Admitting: Family Medicine

## 2024-05-20 ENCOUNTER — Other Ambulatory Visit: Payer: Self-pay | Admitting: Family Medicine

## 2024-05-20 DIAGNOSIS — G319 Degenerative disease of nervous system, unspecified: Secondary | ICD-10-CM

## 2024-05-20 DIAGNOSIS — M25562 Pain in left knee: Secondary | ICD-10-CM

## 2024-06-15 ENCOUNTER — Other Ambulatory Visit: Payer: Self-pay | Admitting: Family Medicine

## 2024-06-15 ENCOUNTER — Other Ambulatory Visit: Payer: Self-pay | Admitting: Nurse Practitioner

## 2024-06-15 DIAGNOSIS — I10 Essential (primary) hypertension: Secondary | ICD-10-CM

## 2024-06-15 MED ORDER — HYDROCHLOROTHIAZIDE 25 MG PO TABS: 25.0000 mg | ORAL_TABLET | Freq: Every day | ORAL | 1 refills | Status: DC

## 2024-06-15 NOTE — Telephone Encounter (Signed)
  To pharmacy: family says dose was increased to a whole tablet,  25mg  daily.  need new rx. pt on last tablet  Please advise

## 2024-07-02 ENCOUNTER — Encounter: Payer: Self-pay | Admitting: Family Medicine

## 2024-07-02 ENCOUNTER — Ambulatory Visit: Payer: Medicare PPO | Admitting: Family Medicine

## 2024-07-02 VITALS — BP 146/74 | HR 54 | Temp 97.8°F | Ht 68.5 in | Wt 191.0 lb

## 2024-07-02 DIAGNOSIS — G319 Degenerative disease of nervous system, unspecified: Secondary | ICD-10-CM

## 2024-07-02 DIAGNOSIS — J301 Allergic rhinitis due to pollen: Secondary | ICD-10-CM

## 2024-07-02 DIAGNOSIS — I2510 Atherosclerotic heart disease of native coronary artery without angina pectoris: Secondary | ICD-10-CM | POA: Diagnosis not present

## 2024-07-02 DIAGNOSIS — M25562 Pain in left knee: Secondary | ICD-10-CM

## 2024-07-02 DIAGNOSIS — I10 Essential (primary) hypertension: Secondary | ICD-10-CM | POA: Diagnosis not present

## 2024-07-02 DIAGNOSIS — L570 Actinic keratosis: Secondary | ICD-10-CM | POA: Diagnosis not present

## 2024-07-02 DIAGNOSIS — E782 Mixed hyperlipidemia: Secondary | ICD-10-CM | POA: Diagnosis not present

## 2024-07-02 DIAGNOSIS — M8580 Other specified disorders of bone density and structure, unspecified site: Secondary | ICD-10-CM

## 2024-07-02 LAB — LIPID PANEL

## 2024-07-02 MED ORDER — MONTELUKAST SODIUM 10 MG PO TABS: 10.0000 mg | ORAL_TABLET | Freq: Every day | ORAL | 3 refills | Status: AC

## 2024-07-02 MED ORDER — MEMANTINE HCL 10 MG PO TABS: 10.0000 mg | ORAL_TABLET | Freq: Two times a day (BID) | ORAL | 3 refills | Status: AC

## 2024-07-02 MED ORDER — BUPROPION HCL ER (XL) 150 MG PO TB24
150.0000 mg | ORAL_TABLET | Freq: Every day | ORAL | 3 refills | Status: AC
Start: 2024-07-02 — End: ?

## 2024-07-02 MED ORDER — ROSUVASTATIN CALCIUM 20 MG PO TABS: 20.0000 mg | ORAL_TABLET | Freq: Every day | ORAL | 3 refills | Status: AC

## 2024-07-02 MED ORDER — DICLOFENAC SODIUM 50 MG PO TBEC: 50.0000 mg | DELAYED_RELEASE_TABLET | Freq: Two times a day (BID) | ORAL | 3 refills | Status: AC

## 2024-07-02 NOTE — Progress Notes (Signed)
 BP (!) 146/74   Pulse (!) 54   Temp 97.8 F (36.6 C)   Ht 5' 8.5 (1.74 m)   Wt 191 lb (86.6 kg)   SpO2 91%   BMI 28.62 kg/m    Subjective:   Patient ID: Shelly Bennett, female    DOB: 08-Nov-1951, 73 y.o.   MRN: [id number]  HPI: Shelly Bennett is a 73 y.o. female presenting on 07/02/2024 for Medical Management of Chronic Issues, Hyperlipidemia, and Hypertension   HPI Hypertension Patient is currently on amlodipine  and hydrochlorothiazide , and their blood pressure today is 146/74. Patient denies any lightheadedness or dizziness. Patient denies headaches, blurred vision, chest pains, shortness of breath, or weakness. Denies any side effects from medication and is content with current medication.   Hyperlipidemia and CAD recheck Patient is coming in for recheck of his hyperlipidemia. The patient is currently taking fish oils and Crestor . They deny any issues with myalgias or history of liver damage from it. They deny any focal numbness or weakness or chest pain.   Osteoporosis/osteopenia Fractures or history of fracture: None Medication: Calcium  and vitamin D . Duration of treatment: Greater than 6 years Last bone density scan: 2 years ago Last T score: Needs 1 today  Facial lesion on left chin Patient has a facial lesion on the left side of her face that has been there for more than a week.  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  Relevant past medical, surgical, family and social history reviewed and updated as indicated. Interim medical history since our last visit reviewed. Allergies and medications reviewed and updated.  Review of Systems  Constitutional:  Negative for chills and fever.  Eyes:  Negative for redness and visual disturbance.  Respiratory:  Negative for chest tightness and shortness of breath.   Cardiovascular:  Negative for chest pain and leg swelling.  Musculoskeletal:  Negative for back pain and gait  problem.  Skin:  Positive for rash.  Neurological:  Positive for weakness. Negative for light-headedness and headaches.  Psychiatric/Behavioral:  Negative for agitation and behavioral problems.   All other systems reviewed and are negative.   Per HPI unless specifically indicated above   Allergies as of 07/02/2024       Reactions   Neomycin-polymyxin-dexameth    Other reaction(s): eye redness   Codeine     ? Reaction ER visit.    Mold Extract [trichophyton]    Migraine   Neomycin    Penicillins Other (See Comments)   drew my legs up as child Pt has tolerated cephalexin  in the past.   Simvastatin    Ace Inhibitors Cough   Angiotensin Receptor Blockers Cough   Levaquin  [levofloxacin ] Rash   Per notes from outpatient provider   Vytorin [ezetimibe-simvastatin] Other (See Comments)   cramping   Zetia [ezetimibe] Other (See Comments)   cramping        Medication List        Accurate as of July 02, 2024 10:32 AM. If you have any questions, ask your nurse or doctor.          STOP taking these medications    Carbidopa -Levodopa  ER 25-100 MG tablet controlled release Commonly known as: SINEMET  CR Stopped by: Fonda LABOR Lillyann Ahart   sulfamethoxazole -trimethoprim  800-160 MG tablet Commonly known as: BACTRIM  DS Stopped by: Fonda LABOR Yuri Fana       TAKE these medications    amLODipine  10 MG tablet Commonly known as:  NORVASC  TAKE ONE TABLET DAILY   aspirin  81 MG tablet Take 81 mg by mouth daily.   buPROPion  150 MG 24 hr tablet Commonly known as: WELLBUTRIN  XL Take 1 tablet (150 mg total) by mouth daily.   calcium  carbonate 1500 (600 Ca) MG Tabs tablet Commonly known as: OSCAL Take 600 mg of elemental calcium  by mouth daily.   cyanocobalamin  1000 MCG tablet Commonly known as: VITAMIN B12 Take 1,000 mcg by mouth daily.   diclofenac  50 MG EC tablet Commonly known as: VOLTAREN  Take 1 tablet (50 mg total) by mouth 2 (two) times daily.   donepezil  10 MG  tablet Commonly known as: ARICEPT  Take 1 tablet (10 mg total) by mouth at bedtime.   hydrochlorothiazide  25 MG tablet Commonly known as: HYDRODIURIL  Take 1 tablet (25 mg total) by mouth daily.   memantine  10 MG tablet Commonly known as: NAMENDA  Take 1 tablet (10 mg total) by mouth 2 (two) times daily.   montelukast  10 MG tablet Commonly known as: SINGULAIR  Take 1 tablet (10 mg total) by mouth at bedtime.   omega-3 acid ethyl esters 1 g capsule Commonly known as: LOVAZA  TAKE TWO CAPSULES TWICE DAILY   polyethylene glycol 17 g packet Commonly known as: MIRALAX  / GLYCOLAX  Take 17 g by mouth 2 (two) times daily. What changed:  when to take this reasons to take this   rosuvastatin  20 MG tablet Commonly known as: CRESTOR  Take 1 tablet (20 mg total) by mouth daily.   senna-docusate 8.6-50 MG tablet Commonly known as: Senokot-S Take 1 tablet by mouth 2 (two) times daily. What changed:  when to take this reasons to take this   Vitamin D -3 125 MCG (5000 UT) Tabs Take 1 capsule by mouth See admin instructions. Take one capsule by mouth daily Mon thru Friday         Objective:   BP (!) 146/74   Pulse (!) 54   Temp 97.8 F (36.6 C)   Ht 5' 8.5 (1.74 m)   Wt 191 lb (86.6 kg)   SpO2 91%   BMI 28.62 kg/m   Wt Readings from Last 3 Encounters:  07/02/24 191 lb (86.6 kg)  01/02/24 188 lb 12.8 oz (85.6 kg)  12/26/23 188 lb 9.6 oz (85.5 kg)    Physical Exam Vitals and nursing note reviewed.  Constitutional:      General: She is not in acute distress.    Appearance: She is well-developed. She is not diaphoretic.  Eyes:     Conjunctiva/sclera: Conjunctivae normal.  Cardiovascular:     Rate and Rhythm: Normal rate and regular rhythm.     Heart sounds: Normal heart sounds. No murmur heard. Pulmonary:     Effort: Pulmonary effort is normal. No respiratory distress.     Breath sounds: Normal breath sounds. No wheezing.  Skin:    General: Skin is warm and dry.      Findings: Lesion (Actinic keratosis left shin, 0.1 cm in diameter.) present. No rash.  Neurological:     Mental Status: She is alert and oriented to person, place, and time.     Coordination: Coordination normal.  Psychiatric:        Behavior: Behavior normal.       Assessment & Plan:   Problem List Items Addressed This Visit       Cardiovascular and Mediastinum   Hypertension   Relevant Medications   rosuvastatin  (CRESTOR ) 20 MG tablet   Other Relevant Orders   CBC with Differential/Platelet  CMP14+EGFR   TSH   CAD (coronary artery disease), native coronary artery   Relevant Medications   rosuvastatin  (CRESTOR ) 20 MG tablet   Other Relevant Orders   CBC with Differential/Platelet   CMP14+EGFR   TSH     Respiratory   Allergic rhinitis   Relevant Medications   montelukast  (SINGULAIR ) 10 MG tablet     Musculoskeletal and Integument   Osteopenia     Other   Hyperlipemia - Primary   Relevant Medications   rosuvastatin  (CRESTOR ) 20 MG tablet   Other Relevant Orders   Lipid panel   Other Visit Diagnoses       Neurodegenerative disorder (HCC)       Relevant Medications   buPROPion  (WELLBUTRIN  XL) 150 MG 24 hr tablet   memantine  (NAMENDA ) 10 MG tablet   Other Relevant Orders   TSH     Acute pain of left knee       Relevant Medications   diclofenac  (VOLTAREN ) 50 MG EC tablet     Actinic keratosis           Cryotherapy actinic keratosis, did 3-10-second bursts patient tolerated well.  Will do blood work on the way out today. Follow up plan: Return in about 6 months (around 01/02/2025), or if symptoms worsen or fail to improve, for Hypertension and hyperlipidemia.  Counseling provided for all of the vaccine components Orders Placed This Encounter  Procedures   CBC with Differential/Platelet   CMP14+EGFR   Lipid panel   TSH    Fonda Levins, MD [facility] 07/02/2024, 10:32 AM

## 2024-07-03 LAB — CBC WITH DIFFERENTIAL/PLATELET
Basophils Absolute: 0 x10E3/uL (ref 0.0–0.2)
Basos: 0 %
EOS (ABSOLUTE): 0.1 x10E3/uL (ref 0.0–0.4)
Eos: 1 %
Hematocrit: 41.4 % (ref 34.0–46.6)
Hemoglobin: 13.3 g/dL (ref 11.1–15.9)
Immature Grans (Abs): 0 x10E3/uL (ref 0.0–0.1)
Immature Granulocytes: 0 %
Lymphocytes Absolute: 1.6 x10E3/uL (ref 0.7–3.1)
Lymphs: 23 %
MCH: 31.2 pg (ref 26.6–33.0)
MCHC: 32.1 g/dL (ref 31.5–35.7)
MCV: 97 fL (ref 79–97)
Monocytes Absolute: 0.5 x10E3/uL (ref 0.1–0.9)
Monocytes: 7 %
Neutrophils Absolute: 4.7 x10E3/uL (ref 1.4–7.0)
Neutrophils: 68 %
Platelets: 284 x10E3/uL (ref 150–450)
RBC: 4.26 x10E6/uL (ref 3.77–5.28)
RDW: 13.4 % (ref 11.7–15.4)
WBC: 6.9 x10E3/uL (ref 3.4–10.8)

## 2024-07-03 LAB — CMP14+EGFR
ALT: 15 IU/L (ref 0–32)
AST: 17 IU/L (ref 0–40)
Albumin: 4.4 g/dL (ref 3.8–4.8)
Alkaline Phosphatase: 84 IU/L (ref 44–121)
BUN/Creatinine Ratio: 20 (ref 12–28)
BUN: 18 mg/dL (ref 8–27)
Bilirubin Total: 0.4 mg/dL (ref 0.0–1.2)
CO2: 22 mmol/L (ref 20–29)
Calcium: 10.1 mg/dL (ref 8.7–10.3)
Chloride: 102 mmol/L (ref 96–106)
Creatinine, Ser: 0.88 mg/dL (ref 0.57–1.00)
Globulin, Total: 2.1 g/dL (ref 1.5–4.5)
Glucose: 89 mg/dL (ref 70–99)
Potassium: 3.9 mmol/L (ref 3.5–5.2)
Sodium: 142 mmol/L (ref 134–144)
Total Protein: 6.5 g/dL (ref 6.0–8.5)
eGFR: 69 mL/min/1.73 (ref >59)

## 2024-07-03 LAB — LIPID PANEL
Chol/HDL Ratio: 2.5 ratio (ref 0.0–4.4)
Cholesterol, Total: 182 mg/dL (ref 100–199)
HDL: 73 mg/dL (ref >39)
LDL Chol Calc (NIH): 91 mg/dL (ref 0–99)
Triglycerides: 102 mg/dL (ref 0–149)
VLDL Cholesterol Cal: 18 mg/dL (ref 5–40)

## 2024-07-03 LAB — TSH: TSH: 2.74 u[IU]/mL (ref 0.450–4.500)

## 2024-07-10 ENCOUNTER — Ambulatory Visit: Payer: Self-pay | Admitting: Family Medicine

## 2024-07-16 ENCOUNTER — Encounter: Payer: Self-pay | Admitting: Family Medicine

## 2024-07-17 ENCOUNTER — Ambulatory Visit (INDEPENDENT_AMBULATORY_CARE_PROVIDER_SITE_OTHER)

## 2024-07-17 ENCOUNTER — Ambulatory Visit: Payer: Self-pay

## 2024-07-17 ENCOUNTER — Ambulatory Visit: Payer: Self-pay | Admitting: Family Medicine

## 2024-07-17 ENCOUNTER — Other Ambulatory Visit: Payer: Self-pay | Admitting: Family Medicine

## 2024-07-17 ENCOUNTER — Telehealth: Admitting: Family Medicine

## 2024-07-17 ENCOUNTER — Encounter: Payer: Self-pay | Admitting: Family Medicine

## 2024-07-17 ENCOUNTER — Telehealth: Admitting: Physician Assistant

## 2024-07-17 ENCOUNTER — Ambulatory Visit (INDEPENDENT_AMBULATORY_CARE_PROVIDER_SITE_OTHER): Admitting: Family Medicine

## 2024-07-17 VITALS — BP 109/63 | HR 55 | Temp 97.9°F | Ht 68.5 in | Wt 192.6 lb

## 2024-07-17 DIAGNOSIS — R059 Cough, unspecified: Secondary | ICD-10-CM

## 2024-07-17 DIAGNOSIS — T17908A Unspecified foreign body in respiratory tract, part unspecified causing other injury, initial encounter: Secondary | ICD-10-CM

## 2024-07-17 DIAGNOSIS — R112 Nausea with vomiting, unspecified: Secondary | ICD-10-CM

## 2024-07-17 DIAGNOSIS — R058 Other specified cough: Secondary | ICD-10-CM

## 2024-07-17 MED ORDER — CLINDAMYCIN HCL 300 MG PO CAPS: 300.0000 mg | ORAL_CAPSULE | Freq: Three times a day (TID) | ORAL | 0 refills | Status: AC

## 2024-07-17 NOTE — Telephone Encounter (Signed)
 Appt today

## 2024-07-17 NOTE — Progress Notes (Signed)
 Acute Office Visit  Subjective:     Patient ID: Shelly Bennett, female    DOB: Feb 20, 1951, 73 y.o.   MRN: [id number]  Chief Complaint  Patient presents with   Cough    Cough   Here with daughter. Patient is in today for cough. Cough started 2 days after after an episode of vomiting while lying down flat. She was not able to sit up quickly and got choked. Concerned for aspiration. Coughing has been worsening. Now productive with green phlegm. She has been more short of breath than her baseline. Has not a HA and runny nose that is normal for her. Denies fever, chills, chest pain, wheezing. Has not had another episode of vomiting or diarrhea. No chronic lung history. Denies dysphagia. She does have dementia.   Review of Systems  Respiratory:  Positive for cough.    As per HPI.      Objective:    BP 109/63   Pulse (!) 55   Temp 97.9 F (36.6 C) (Temporal)   Ht 5' 8.5 (1.74 m)   Wt 192 lb 9.6 oz (87.4 kg)   SpO2 97%   BMI 28.86 kg/m    Physical Exam Vitals and nursing note reviewed.  Constitutional:      General: She is not in acute distress.    Appearance: She is not ill-appearing, toxic-appearing or diaphoretic.  HENT:     Head: Normocephalic and atraumatic.     Right Ear: Tympanic membrane, ear canal and external ear normal.     Left Ear: Tympanic membrane, ear canal and external ear normal.     Nose: Nose normal.     Mouth/Throat:     Mouth: Mucous membranes are moist.     Pharynx: Oropharynx is clear.  Eyes:     General:        Right eye: No discharge.        Left eye: No discharge.     Conjunctiva/sclera: Conjunctivae normal.     Pupils: Pupils are equal, round, and reactive to light.  Cardiovascular:     Rate and Rhythm: Normal rate and regular rhythm.     Heart sounds: Murmur heard.     Systolic murmur is present with a grade of 3/6.  Pulmonary:     Effort: Pulmonary effort is normal. No respiratory distress.     Breath sounds: Normal breath  sounds. No stridor. No wheezing, rhonchi or rales.  Chest:     Chest wall: No tenderness.  Abdominal:     General: Bowel sounds are normal. There is no distension.     Palpations: Abdomen is soft.     Tenderness: There is no abdominal tenderness. There is no guarding or rebound.  Musculoskeletal:     Cervical back: Neck supple.  Lymphadenopathy:     Cervical: No cervical adenopathy.  Skin:    General: Skin is warm and dry.  Neurological:     Mental Status: She is alert and oriented to person, place, and time. Mental status is at baseline.     Gait: Gait abnormal (arrives in wheelchair).  Psychiatric:        Mood and Affect: Mood normal.        Behavior: Behavior normal.     No results found for any visits on 07/17/24.      Assessment & Plan:   Rasheida Broden was seen today for cough.  Diagnoses and all orders for this visit:  Aspiration into airway, initial encounter -  Cancel: DG Chest 2 View; Future -     clindamycin (CLEOCIN) 300 MG capsule; Take 1 capsule (300 mg total) by mouth 3 (three) times daily for 7 days.  Productive cough -     clindamycin (CLEOCIN) 300 MG capsule; Take 1 capsule (300 mg total) by mouth 3 (three) times daily for 7 days.   Xray today in office without acute findings. Radiology read is pending. Since history is consistent with possible aspiration of stomach contacts now with productive cough, will treat empirically with clindamycin as above given PCN allergy. Return to office for new or worsening symptoms, or if symptoms persist.    Annabella CHRISTELLA Search, FNP

## 2024-07-17 NOTE — Telephone Encounter (Signed)
 FYI Only or Action Required?: FYI only for provider.  Patient was last seen in primary care on 07/02/2024 by Dettinger, Fonda LABOR, MD.  Called Nurse Triage reporting Cough.  Symptoms began several days ago.  Interventions attempted: Rest, hydration, or home remedies.  Symptoms are: unchanged.  Triage Disposition: See HCP Within 4 Hours (Or PCP Triage)  Patient/caregiver understands and will follow disposition?: Yes  Copied from CRM [id number]. Topic: Clinical - Red Word Triage >> Jul 17, 2024  9:20 AM Miquel SAILOR wrote: Red Word that prompted transfer to Nurse Triage: Daughter is on the phone not with PT-PT has Alzheimer's/Vomiting/cough since 07/28 getting worse Green mucus Reason for Disposition  [1] MILD difficulty breathing (e.g., minimal/no SOB at rest, SOB with walking, pulse < 100) AND [2] still present when not coughing  Answer Assessment - Initial Assessment Questions 1. ONSET: When did the cough begin?      Daughter states a cough was noticed on Monday-cough has progressed to productive with green mucus 2. SEVERITY: How bad is the cough today?      6 out of 10 3. SPUTUM: Describe the color of your sputum (e.g., none, dry cough; clear, white, yellow, green)     green 4. HEMOPTYSIS: Are you coughing up any blood? If Yes, ask: How much? (e.g., flecks, streaks, tablespoons, etc.)     no 5. DIFFICULTY BREATHING: Are you having difficulty breathing? If Yes, ask: How bad is it? (e.g., mild, moderate, severe)      Mild shortness of breath 6. FEVER: Do you have a fever? If Yes, ask: What is your temperature, how was it measured, and when did it start?     no 7. CARDIAC HISTORY: Do you have any history of heart disease? (e.g., heart attack, congestive heart failure)      HTN, coronary blockage 8. LUNG HISTORY: Do you have any history of lung disease?  (e.g., pulmonary embolus, asthma, emphysema)     no 9. PE RISK FACTORS: Do you have a history of blood clots?  (or: recent major surgery, recent prolonged travel, bedridden)     no 10. OTHER SYMPTOMS: Do you have any other symptoms? (e.g., runny nose, wheezing, chest pain)       Reports a couple of episodes of vomiting 12. TRAVEL: Have you traveled out of the country in the last month? (e.g., travel history, exposures)       No  No appointments available in office today. Daughter is requesting a video visit-[facility] virtual visit scheduled for today at 10:30 AM.  Protocols used: Cough - Acute Productive-A-AH

## 2024-07-17 NOTE — Progress Notes (Signed)
 Virtual Visit Consent   Shelly Bennett, you are scheduled for a virtual visit with a [facility] provider today. Just as with appointments in the office, your consent must be obtained to participate. Your consent will be active for this visit and any virtual visit you may have with one of our providers in the next 365 days. If you have a MyChart account, a copy of this consent can be sent to you electronically.  As this is a virtual visit, video technology does not allow for your provider to perform a traditional examination. This may limit your provider's ability to fully assess your condition. If your provider identifies any concerns that need to be evaluated in person or the need to arrange testing (such as labs, EKG, etc.), we will make arrangements to do so. Although advances in technology are sophisticated, we cannot ensure that it will always work on either your end or our end. If the connection with a video visit is poor, the visit may have to be switched to a telephone visit. With either a video or telephone visit, we are not always able to ensure that we have a secure connection.  By engaging in this virtual visit, you consent to the provision of healthcare and authorize for your insurance to be billed (if applicable) for the services provided during this visit. Depending on your insurance coverage, you may receive a charge related to this service.  I need to obtain your verbal consent now. Are you willing to proceed with your visit today? Shelly Bennett has provided verbal consent on 07/17/2024 for a virtual visit (video or telephone). Shelly Bennett, [place]  Date: 07/17/2024 10:46 AM   Virtual Visit via Video Note   I, Shelly Bennett, connected with  Shelly Bennett  ([id number], July 04, 1951) on 07/17/24 at 10:30 AM EDT by a video-enabled telemedicine application and verified that I am speaking with the correct person using two identifiers.  Location: Patient: Virtual Visit  Location Patient: Home Provider: Virtual Visit Location Provider: Home Office   I discussed the limitations of evaluation and management by telemedicine and the availability of in person appointments. The patient expressed understanding and agreed to proceed.    History of Present Illness: Shelly Bennett is a 73 y.o. who identifies as a female who was assigned female at birth, and is being seen today for multiple issues. Had nausea and vomiting on Wednesday. Now has had a worsening cough since that time. Family is concerned that patient may have aspirated. No fevers, chills, abdominal pain, or any additional contributory symptoms.   HPI: Cough This is a new problem. The current episode started today. The problem has been unchanged. The cough is Non-productive. Nothing aggravates the symptoms. She has tried nothing for the symptoms. The treatment provided mild relief.    Problems:  Patient Active Problem List   Diagnosis Date Noted   Dementia (HCC) 05/02/2023   Frequent falls 03/12/2023   Parkinsonism (HCC) 01/24/2023   Bilateral primary osteoarthritis of knee 10/04/2022   Gait abnormality 03/10/2020   Gastroesophageal reflux disease 10/19/2019   Third degree uterine prolapse 10/19/2019   Spinal stenosis of lumbar region without neurogenic claudication 09/15/2019   Urinary and fecal incontinence 09/15/2019   Cystocele with rectocele 09/15/2019   Left lumbar radiculopathy 01/22/2018   Cognitive impairment 10/22/2017   Anxiety 10/22/2017   Aortic atherosclerosis (HCC) 12/04/2016   Vitamin D  deficiency 08/19/2014   Osteopenia 08/12/2013   CAD (coronary artery disease), native coronary artery 03/22/2013  Aortic valve stenosis, mild 03/22/2013   Hyperlipemia 03/11/2013   Hypertension 03/11/2013   Allergic rhinitis 03/11/2013   IBS (irritable bowel syndrome) - with chronic recurrent abdominal pain 02/01/2012   History of colonic polyps 02/01/2012    Allergies:  Allergies   Allergen Reactions   Neomycin-Polymyxin-Dexameth     Other reaction(s): eye redness   Codeine      ? Reaction ER visit.    Mold Extract [Trichophyton]     Migraine   Neomycin    Penicillins Other (See Comments)    drew my legs up as child Pt has tolerated cephalexin  in the past.   Simvastatin    Ace Inhibitors Cough   Angiotensin Receptor Blockers Cough   Levaquin  [Levofloxacin ] Rash    Per notes from outpatient provider   Vytorin [Ezetimibe-Simvastatin] Other (See Comments)    cramping   Zetia [Ezetimibe] Other (See Comments)    cramping   Medications:  Current Outpatient Medications:    amLODipine  (NORVASC ) 10 MG tablet, TAKE ONE TABLET DAILY, Disp: 90 tablet, Rfl: 3   aspirin  81 MG tablet, Take 81 mg by mouth daily. , Disp: , Rfl:    buPROPion  (WELLBUTRIN  XL) 150 MG 24 hr tablet, Take 1 tablet (150 mg total) by mouth daily., Disp: 90 tablet, Rfl: 3   calcium  carbonate (OSCAL) 1500 (600 Ca) MG TABS tablet, Take 600 mg of elemental calcium  by mouth daily., Disp: , Rfl:    Cholecalciferol (VITAMIN D -3) 5000 UNITS TABS, Take 1 capsule by mouth See admin instructions. Take one capsule by mouth daily Mon thru Friday, Disp: , Rfl:    cyanocobalamin  (VITAMIN B12) 1000 MCG tablet, Take 1,000 mcg by mouth daily., Disp: , Rfl:    diclofenac  (VOLTAREN ) 50 MG EC tablet, Take 1 tablet (50 mg total) by mouth 2 (two) times daily., Disp: 180 tablet, Rfl: 3   donepezil  (ARICEPT ) 10 MG tablet, Take 1 tablet (10 mg total) by mouth at bedtime., Disp: 90 tablet, Rfl: 3   hydrochlorothiazide  (HYDRODIURIL ) 25 MG tablet, Take 1 tablet (25 mg total) by mouth daily., Disp: 90 tablet, Rfl: 1   memantine  (NAMENDA ) 10 MG tablet, Take 1 tablet (10 mg total) by mouth 2 (two) times daily., Disp: 180 tablet, Rfl: 3   montelukast  (SINGULAIR ) 10 MG tablet, Take 1 tablet (10 mg total) by mouth at bedtime., Disp: 90 tablet, Rfl: 3   omega-3 acid ethyl esters (LOVAZA ) 1 g capsule, TAKE TWO CAPSULES TWICE DAILY,  Disp: 360 capsule, Rfl: 3   polyethylene glycol (MIRALAX  / GLYCOLAX ) 17 g packet, Take 17 g by mouth 2 (two) times daily. (Patient taking differently: Take 17 g by mouth daily as needed.), Disp: 14 each, Rfl: 0   rosuvastatin  (CRESTOR ) 20 MG tablet, Take 1 tablet (20 mg total) by mouth daily., Disp: 90 tablet, Rfl: 3   senna-docusate (SENOKOT-S) 8.6-50 MG tablet, Take 1 tablet by mouth 2 (two) times daily. (Patient taking differently: Take 1 tablet by mouth daily as needed.), Disp: , Rfl:   Observations/Objective: Patient is well-developed, well-nourished in no acute distress.  Resting comfortably  at home.  Head is normocephalic, atraumatic.  No labored breathing.  Speech is clear and coherent with logical content.  Patient is alert and oriented at baseline.    Assessment and Plan: 1. Cough, unspecified type (Primary)  2. Nausea and vomiting, unspecified vomiting type  Patient with nausea and vomiting x 2 with alzheimer's. Concerned   for possible aspiration PNA given symptoms and history. Advised patient and  family that in person evaluation is warranted to fully evaluate the symptoms and rule out any other issues. Family agreeable to plan  Follow Up Instructions: I discussed the assessment and treatment plan with the patient. The patient was provided an opportunity to ask questions and all were answered. The patient agreed with the plan and demonstrated an understanding of the instructions.  A copy of instructions were sent to the patient via MyChart unless otherwise noted below.    The patient was advised to call back or seek an in-person evaluation if the symptoms worsen or if the condition fails to improve as anticipated.    Shelly Shuck, PA-C

## 2024-07-17 NOTE — Patient Instructions (Signed)
 [name], thank you for joining [name], PA-C for today's virtual visit.  While this provider is not your primary care provider (PCP), if your PCP is located in our provider database this encounter information will be shared with them immediately following your visit.   A [facility] MyChart account gives you access to today's visit and all your visits, tests, and labs performed at [facility]  click here if you don't have a [facility] MyChart account or go to [email or web address]  Consent: (Patient) [name] provided verbal consent for this virtual visit at the beginning of the encounter.  Current Medications:  Current Outpatient Medications:    amLODipine  (NORVASC ) 10 MG tablet, TAKE ONE TABLET DAILY, Disp: 90 tablet, Rfl: 3   aspirin  81 MG tablet, Take 81 mg by mouth daily. , Disp: , Rfl:    buPROPion  (WELLBUTRIN  XL) 150 MG 24 hr tablet, Take 1 tablet (150 mg total) by mouth daily., Disp: 90 tablet, Rfl: 3   calcium  carbonate (OSCAL) 1500 (600 Ca) MG TABS tablet, Take 600 mg of elemental calcium  by mouth daily., Disp: , Rfl:    Cholecalciferol (VITAMIN D -3) 5000 UNITS TABS, Take 1 capsule by mouth See admin instructions. Take one capsule by mouth daily Mon thru Friday, Disp: , Rfl:    cyanocobalamin  (VITAMIN B12) 1000 MCG tablet, Take 1,000 mcg by mouth daily., Disp: , Rfl:    diclofenac  (VOLTAREN ) 50 MG EC tablet, Take 1 tablet (50 mg total) by mouth 2 (two) times daily., Disp: 180 tablet, Rfl: 3   donepezil  (ARICEPT ) 10 MG tablet, Take 1 tablet (10 mg total) by mouth at bedtime., Disp: 90 tablet, Rfl: 3   hydrochlorothiazide  (HYDRODIURIL ) 25 MG tablet, Take 1 tablet (25 mg total) by mouth daily., Disp: 90 tablet, Rfl: 1   memantine  (NAMENDA ) 10 MG tablet, Take 1 tablet (10 mg total) by mouth 2 (two) times daily., Disp: 180 tablet, Rfl: 3   montelukast  (SINGULAIR ) 10 MG tablet, Take 1 tablet (10 mg total) by mouth at bedtime., Disp: 90  tablet, Rfl: 3   omega-3 acid ethyl esters (LOVAZA ) 1 g capsule, TAKE TWO CAPSULES TWICE DAILY, Disp: 360 capsule, Rfl: 3   polyethylene glycol (MIRALAX  / GLYCOLAX ) 17 g packet, Take 17 g by mouth 2 (two) times daily. (Patient taking differently: Take 17 g by mouth daily as needed.), Disp: 14 each, Rfl: 0   rosuvastatin  (CRESTOR ) 20 MG tablet, Take 1 tablet (20 mg total) by mouth daily., Disp: 90 tablet, Rfl: 3   senna-docusate (SENOKOT-S) 8.6-50 MG tablet, Take 1 tablet by mouth 2 (two) times daily. (Patient taking differently: Take 1 tablet by mouth daily as needed.), Disp: , Rfl:    Medications ordered in this encounter:  No orders of the defined types were placed in this encounter.    *If you need refills on other medications prior to your next appointment, please contact your pharmacy*  Follow-Up: Call back or seek an in-person evaluation if the symptoms worsen or if the condition fails to improve as anticipated.  [facility] [phone]  Other Instructions Report to nearest Urgent Care or ER for evaluation.    If you have been instructed to have an in-person evaluation today at a local Urgent Care facility, please use the link below. It will take you to a list of all of our available [facility], including address, phone number and hours of operation. Please do not delay care.   Urgent  Cares  If you or a family member do not have a primary care provider, use the link below to schedule a visit and establish care. When you choose a [facility] primary care physician or advanced practice provider, you gain a long-term partner in health. Find a Primary Care Provider  Learn more about [facility] in-office and virtual care options: [facility] Get Care Now

## 2024-07-29 DIAGNOSIS — H02403 Unspecified ptosis of bilateral eyelids: Secondary | ICD-10-CM | POA: Diagnosis not present

## 2024-07-29 DIAGNOSIS — H2513 Age-related nuclear cataract, bilateral: Secondary | ICD-10-CM | POA: Diagnosis not present

## 2024-07-29 DIAGNOSIS — B0050 Herpesviral ocular disease, unspecified: Secondary | ICD-10-CM | POA: Diagnosis not present

## 2024-08-14 ENCOUNTER — Ambulatory Visit: Payer: Medicare PPO

## 2024-08-14 VITALS — BP 109/63 | HR 55 | Ht 68.0 in | Wt 192.0 lb

## 2024-08-14 DIAGNOSIS — Z13820 Encounter for screening for osteoporosis: Secondary | ICD-10-CM

## 2024-08-14 DIAGNOSIS — Z0000 Encounter for general adult medical examination without abnormal findings: Secondary | ICD-10-CM

## 2024-08-14 NOTE — Progress Notes (Signed)
 Subjective:   Shelly Bennett is a 73 y.o. who presents for a Medicare Wellness preventive visit.  As a reminder, Annual Wellness Visits don't include a physical exam, and some assessments may be limited, especially if this visit is performed virtually. We may recommend an in-person follow-up visit with your provider if needed.  Visit Complete: Virtual I connected with  Shelly Bennett on 08/14/24 by a audio enabled telemedicine application and verified that I am speaking with the correct person using two identifiers.  Patient Location: Home  Provider Location: Home Office  I discussed the limitations of evaluation and management by telemedicine. The patient expressed understanding and agreed to proceed.  Vital Signs: Because this visit was a virtual/telehealth visit, some criteria may be missing or patient reported. Any vitals not documented were not able to be obtained and vitals that have been documented are patient reported.  VideoDeclined- This patient declined Librarian, academic. Therefore the visit was completed with audio only.  Persons Participating in Visit: Patient.  AWV Questionnaire: No: Patient Medicare AWV questionnaire was not completed prior to this visit.  Cardiac Risk Factors include: advanced age (>77men, >68 women);dyslipidemia;hypertension     Objective:    Today's Vitals   08/14/24 0838  BP: 109/63  Pulse: (!) 55  Weight: 192 lb (87.1 kg)  Height: 5' 8 (1.727 m)   Body mass index is 29.19 kg/m.     08/14/2024    8:50 AM 08/14/2023    9:07 AM 07/12/2023    9:34 AM 03/12/2023   11:00 PM 02/06/2023    3:31 PM 07/24/2022    2:15 PM 06/20/2021    2:55 PM  Advanced Directives  Does Patient Have a Medical Advance Directive? No Yes Yes Yes Yes Yes Yes  Type of Furniture conservator/restorer;Living will Healthcare Power of [place];Living will Healthcare Power of [place];Living will  Healthcare Power of  [place];Living will Healthcare Power of [place];Living will  Does patient want to make changes to medical advance directive?   No - Patient declined No - Guardian declined     Copy of Healthcare Power of Attorney in Chart?  No - copy requested No - copy requested No - copy requested  No - copy requested No - copy requested    Current Medications (verified) Outpatient Encounter Medications as of 08/14/2024  Medication Sig   amLODipine  (NORVASC ) 10 MG tablet TAKE ONE TABLET DAILY   aspirin  81 MG tablet Take 81 mg by mouth daily.    calcium  carbonate (OSCAL) 1500 (600 Ca) MG TABS tablet Take 600 mg of elemental calcium  by mouth daily.   Cholecalciferol (VITAMIN D -3) 5000 UNITS TABS Take 1 capsule by mouth See admin instructions. Take one capsule by mouth daily Mon thru Friday   cyanocobalamin  (VITAMIN B12) 1000 MCG tablet Take 1,000 mcg by mouth daily.   diclofenac  (VOLTAREN ) 50 MG EC tablet Take 1 tablet (50 mg total) by mouth 2 (two) times daily.   donepezil  (ARICEPT ) 10 MG tablet Take 1 tablet (10 mg total) by mouth at bedtime.   hydrochlorothiazide  (HYDRODIURIL ) 25 MG tablet Take 1 tablet (25 mg total) by mouth daily.   memantine  (NAMENDA ) 10 MG tablet Take 1 tablet (10 mg total) by mouth 2 (two) times daily.   montelukast  (SINGULAIR ) 10 MG tablet Take 1 tablet (10 mg total) by mouth at bedtime.   omega-3 acid ethyl esters (LOVAZA ) 1 g capsule TAKE TWO CAPSULES TWICE DAILY   polyethylene glycol (  MIRALAX  / GLYCOLAX ) 17 g packet Take 17 g by mouth 2 (two) times daily. (Patient taking differently: Take 17 g by mouth daily as needed.)   rosuvastatin  (CRESTOR ) 20 MG tablet Take 1 tablet (20 mg total) by mouth daily.   senna-docusate (SENOKOT-S) 8.6-50 MG tablet Take 1 tablet by mouth 2 (two) times daily. (Patient taking differently: Take 1 tablet by mouth daily as needed.)   buPROPion  (WELLBUTRIN  XL) 150 MG 24 hr tablet Take 1 tablet (150 mg total) by mouth daily. (Patient not taking: Reported on  08/14/2024)   No facility-administered encounter medications on file as of 08/14/2024.    Allergies (verified) Neomycin-polymyxin-dexameth, Codeine , Mold extract [trichophyton], Neomycin, Penicillins, Simvastatin, Ace inhibitors, Angiotensin receptor blockers, Levaquin  [levofloxacin ], Vytorin [ezetimibe-simvastatin], and Zetia [ezetimibe]   History: Past Medical History:  Diagnosis Date   Adenomatous colon polyp    Allergic drug reaction 06/25/2015   Allergy    Anxiety    Arthritis    At risk for falls    fallen 4 times recently    CAD (coronary artery disease)    Dr. Wonda follows    Carpal tunnel syndrome    Chronic headaches    Coccydynia 02/01/2012   Cystocele    Diverticulosis    External hemorrhoids    Focal nodular hyperplasia of liver    Gait abnormality    GERD (gastroesophageal reflux disease)    some   HCAP (healthcare-associated pneumonia) 06/25/2015   Heart murmur    HLD (hyperlipidemia)    HTN (hypertension)    IBS (irritable bowel syndrome)    Memory loss    Neuromuscular disorder (HCC)    some neuropathy issues in the past- legs fibromyalgia in past-- past hx of steroid injections   Obesity    Osteoporosis    Otitis of left ear 02/01/2017   Pneumonia    Skin cancer    squamous cell - face   Past Surgical History:  Procedure Laterality Date   CARPAL TUNNEL RELEASE     COLONOSCOPY  12/15/2008   diverticulosis, external hemorrhoids   KNEE SURGERY  05/22/2015   right   LAPAROSCOPY     SKIN SURGERY     squamous cell - right face    Family History  Problem Relation Age of Onset   Hyperlipidemia Mother    Hypertension Mother    Kidney disease Mother    Osteoporosis Mother    Heart murmur Mother    Dementia Mother    Prostate cancer Father    Colon cancer Father 88   Kidney disease Father    Heart disease Father    Alzheimer's disease Father    Hyperlipidemia Sister    Hypertension Sister    Rectal cancer Maternal Grandmother 42   Heart disease  Maternal Grandfather    Heart disease Brother 39       not dx questionable    Colon polyps Neg Hx    Esophageal cancer Neg Hx    Stomach cancer Neg Hx    Breast cancer Neg Hx    Social History   Socioeconomic History   Marital status: Married    Spouse name: Shelly Bennett   Number of children: 4   Years of education: 16   Highest education level: Bachelor's degree (e.g., BA, AB, BS)  Occupational History   Occupation: Runner, broadcasting/film/video retired  Tobacco Use   Smoking status: Never   Smokeless tobacco: Never  Vaping Use   Vaping status: Never Used  Substance and Sexual Activity  Alcohol use: No   Drug use: No   Sexual activity: Not Currently    Birth control/protection: None  Other Topics Concern   Not on file  Social History Narrative   Lives at home with her husband.   Right-handed.   1 cup coffee each morning. 2-3 small soda cans each day.   Social Drivers of Corporate investment banker Strain: Low Risk  (08/14/2023)   Overall Financial Resource Strain (CARDIA)    Difficulty of Paying Living Expenses: Not hard at all  Food Insecurity: No Food Insecurity (08/14/2024)   Hunger Vital Sign    Worried About Running Out of Food in the Last Year: Never true    Ran Out of Food in the Last Year: Never true  Transportation Needs: No Transportation Needs (08/14/2024)   PRAPARE - Administrator, Civil Service (Medical): No    Lack of Transportation (Non-Medical): No  Physical Activity: Insufficiently Active (08/14/2024)   Exercise Vital Sign    Days of Exercise per Week: 7 days    Minutes of Exercise per Session: 20 min  Stress: No Stress Concern Present (08/14/2024)   [employer] of Occupational Health - Occupational Stress Questionnaire    Feeling of Stress: Not at all  Social Connections: Socially Integrated (08/14/2024)   Social Connection and Isolation Panel    Frequency of Communication with Friends and Family: More than three times a week    Frequency of Social  Gatherings with Friends and Family: More than three times a week    Attends Religious Services: More than 4 times per year    Active Member of [employer] or Organizations: Yes    Attends Engineer, structural: More than 4 times per year    Marital Status: Married    Tobacco Counseling Counseling given: Yes    Clinical Intake:  Pre-visit preparation completed: Yes  Pain : No/denies pain     BMI - recorded: 29.19 Nutritional Status: BMI 25 -29 Overweight Nutritional Risks: None Diabetes: No  No results found for: HGBA1C   How often do you need to have someone help you when you read instructions, pamphlets, or other written materials from your doctor or pharmacy?: 1 - Never  Interpreter Needed?: No  Information entered by :: alia t/cma   Activities of Daily Living     08/14/2024    8:46 AM  In your present state of health, do you have any difficulty performing the following activities:  Hearing? 0  Vision? 0  Difficulty concentrating or making decisions? 0  Walking or climbing stairs? 0  Dressing or bathing? 0  Doing errands, shopping? 1  Comment pt's husband  Quarry manager and eating ? N  Using the Toilet? N  In the past six months, have you accidently leaked urine? Y  Do you have problems with loss of bowel control? N  Managing your Medications? N  Managing your Finances? N  Housekeeping or managing your Housekeeping? N    Patient Care Team: Dettinger, Fonda LABOR, MD as PCP - General (Family Medicine) Wonda Sharper, MD as PCP - Cardiology (Cardiology) Morris Shelly Bennett BIRCH, MD (Cardiology) Wonda Sharper, MD (Cardiology) Avram Lupita BRAVO, MD (Gastroenterology) Austin Debby, MD (Obstetrics and Gynecology) Anderson Maude ORN, MD (Inactive) (Orthopedic Surgery) Onita Duos, MD as Consulting Physician (Neurology)  I have updated your Care Teams any recent Medical Services you may have received from other providers in the past year.     Assessment:    This  is a routine wellness examination for [facility].  Hearing/Vision screen Hearing Screening - Comments:: Pt denies hearing dif Vision Screening - Comments:: Pt wear glasses for reading/otherwise vision is good/pt goes to Dr. Vanetta ov 3 mos ago   Goals Addressed             This Visit's Progress    Exercise 3x per week (30 min per time)   On track      Depression Screen     08/14/2024    8:54 AM 07/02/2024   10:15 AM 12/26/2023   10:07 AM 08/14/2023    9:06 AM 06/27/2023   10:10 AM 03/21/2023    8:55 AM 12/31/2022   10:45 AM  PHQ 2/9 Scores  PHQ - 2 Score 2 3 4  0 4 6 2   PHQ- 9 Score 4 8 10  0 10 27 5     Fall Risk     08/14/2024    8:39 AM 07/02/2024   10:15 AM 12/26/2023   10:06 AM 08/14/2023    9:03 AM 06/27/2023   10:10 AM  Fall Risk   Falls in the past year? 1 1 1  0 1  Number falls in past yr: 0 0 1 0 1  Injury with Fall? 0 0 1 0 1  Risk for fall due to : Impaired balance/gait;Impaired mobility Impaired balance/gait;Impaired mobility Impaired balance/gait;Impaired mobility No Fall Risks Impaired balance/gait;Impaired mobility  Follow up Falls evaluation completed Falls evaluation completed Falls evaluation completed Falls prevention discussed Falls evaluation completed    MEDICARE RISK AT HOME:  Medicare Risk at Home Any stairs in or around the home?: Yes If so, are there any without handrails?: Yes Home free of loose throw rugs in walkways, pet beds, electrical cords, etc?: Yes Adequate lighting in your home to reduce risk of falls?: Yes Life alert?: No Use of a cane, walker or w/c?: No Grab bars in the bathroom?: Yes Shower chair or bench in shower?: Yes Elevated toilet seat or a handicapped toilet?: Yes  TIMED UP AND GO:  Was the test performed?  no  Cognitive Function: 6CIT completed    09/26/2023   10:26 AM 01/24/2023    1:44 PM 11/29/2022    4:26 PM 03/10/2020    3:07 PM 02/05/2019    9:21 AM  MMSE - Mini Mental State Exam  Orientation to time 4  1 4 5 4   Orientation to Place 4 5 5 5 5   Registration 3 3 3 3 3   Attention/ Calculation 5 3 5 5 5   Recall 1 2 3 1 3   Language- name 2 objects 2 2 2 2 2   Language- repeat 1 1 0 1 1  Language- follow 3 step command 3 3 3 3 3   Language- read & follow direction 1 1 0 1 1  Write a sentence 1 1 1 1 1   Copy design 0 1 1 1 1   Total score 25 23 27 28 29       05/02/2023   11:00 AM 05/02/2023    9:00 AM 03/27/2023    9:00 AM  Montreal Cognitive Assessment   Visuospatial/ Executive (0/5) 3 3 1   Naming (0/3) 3 3 3   Attention: Read list of digits (0/2) 2 2 2   Attention: Read list of letters (0/1) 1 1 0  Attention: Serial 7 subtraction starting at 100 (0/3) 1 3 2   Language: Repeat phrase (0/2) 2 2 2   Language : Fluency (0/1) 1 1 1   Abstraction (0/2) 2 2 2  Delayed Recall (0/5) 1 1 0  Orientation (0/6) 4 1 5   Total 20 19 18       08/14/2024    8:58 AM 08/14/2023    9:07 AM 07/24/2022    2:13 PM 06/20/2021    2:57 PM 06/16/2020   10:41 AM  6CIT Screen  What Year? 0 points 0 points 0 points 0 points 0 points  What month? 0 points 0 points 0 points 0 points 3 points  What time? 0 points 0 points 0 points 0 points 0 points  Count back from 20 0 points 0 points 0 points 0 points 0 points  Months in reverse 4 points 0 points 2 points 0 points 0 points  Repeat phrase 0 points 2 points 8 points 0 points 2 points  Total Score 4 points 2 points 10 points 0 points 5 points    Immunizations Immunization History  Administered Date(s) Administered   Fluad Quad(high Dose 65+) 10/19/2019, 09/29/2020, 10/07/2021, 12/31/2022   Fluad Trivalent(High Dose 65+) 10/22/2023   INFLUENZA, HIGH DOSE SEASONAL PF 09/15/2016, 09/04/2017, 09/24/2018   Influenza Whole 11/16/2012   Influenza,inj,Quad PF,6+ Mos 09/15/2013, 10/06/2014, 11/01/2015   Moderna Sars-Covid-2 Vaccination 05/02/2020   Pneumococcal Conjugate-13 11/01/2015   Pneumococcal Polysaccharide-23 09/04/2017   Tdap 05/01/2017    Screening Tests Health  Maintenance  Topic Date Due   COLON CANCER SCREENING ANNUAL FOBT  08/04/2020   DEXA SCAN  03/28/2024   INFLUENZA VACCINE  07/17/2024   Zoster Vaccines- Shingrix (1 of 2) 07/01/2025 (Originally 08/07/1970)   Hepatitis C Screening  07/02/2025 (Originally 08/07/1969)   MAMMOGRAM  09/10/2024   Medicare Annual Wellness (AWV)  08/14/2025   DTaP/Tdap/Td (2 - Td or Tdap) 05/02/2027   Colonoscopy  11/20/2028   Pneumococcal Vaccine: 50+ Years  Completed   HPV VACCINES  Aged Out   Meningococcal B Vaccine  Aged Out   COVID-19 Vaccine  Discontinued    Health Maintenance  Health Maintenance Due  Topic Date Due   COLON CANCER SCREENING ANNUAL FOBT  08/04/2020   DEXA SCAN  03/28/2024   INFLUENZA VACCINE  07/17/2024   Health Maintenance Items Addressed: DEXA ordered  Additional Screening:  Vision Screening: Recommended annual ophthalmology exams for early detection of glaucoma and other disorders of the eye. Would you like a referral to an eye doctor? No    Dental Screening: Recommended annual dental exams for proper oral hygiene  Community Resource Referral / Chronic Care Management: CRR required this visit?  No   CCM required this visit?  No   Plan:    I have personally reviewed and noted the following in the patient's chart:   Medical and social history Use of alcohol, tobacco or illicit drugs  Current medications and supplements including opioid prescriptions. Patient is not currently taking opioid prescriptions. Functional ability and status Nutritional status Physical activity Advanced directives List of other physicians Hospitalizations, surgeries, and ER visits in previous 12 months Vitals Screenings to include cognitive, depression, and falls Referrals and appointments  In addition, I have reviewed and discussed with patient certain preventive protocols, quality metrics, and best practice recommendations. A written personalized care plan for preventive services as  well as general preventive health recommendations were provided to patient.   Ozie Ned, CMA   08/14/2024   After Visit Summary: (MyChart) Due to this being a telephonic visit, the after visit summary with patients personalized plan was offered to patient via MyChart   Notes: PCP Follow Up Recommendations: Pt is aware  and due the following: dexa-ordered, flu--pt don't want to get it per pt's daughter, colonoscopy has already been done not due until 2029

## 2024-08-14 NOTE — Patient Instructions (Addendum)
 Shelly Bennett , Thank you for taking time out of your busy schedule to complete your Annual Wellness Visit with me. I enjoyed our conversation and look forward to speaking with you again next year. I, as well as your care team,  appreciate your ongoing commitment to your health goals. Please review the following plan we discussed and let me know if I can assist you in the future. Your Game plan/ To Do List    Referrals: If you haven't heard from the office you've been referred to, please reach out to them at the phone provided.   Follow up Visits: We will see or speak with you next year for your Next Medicare AWV with our clinical staff on 07/17/25 at 9:20a.m. Have you seen your provider in the last 6 months (3 months if uncontrolled diabetes)? Yes  Clinician Recommendations:  Aim for 30 minutes of exercise or brisk walking, 6-8 glasses of water, and 5 servings of fruits and vegetables each day.       This is a list of the screenings recommended for you:  Health Maintenance  Topic Date Due   Stool Blood Test  08/04/2020   DEXA scan (bone density measurement)  03/28/2024   Flu Shot  07/17/2024   Zoster (Shingles) Vaccine (1 of 2) 07/01/2025*   Hepatitis C Screening  07/02/2025*   Mammogram  09/10/2024   Medicare Annual Wellness Visit  08/14/2025   DTaP/Tdap/Td vaccine (2 - Td or Tdap) 05/02/2027   Colon Cancer Screening  11/20/2028   Pneumococcal Vaccine for age over 12  Completed   HPV Vaccine  Aged Out   Meningitis B Vaccine  Aged Out   COVID-19 Vaccine  Discontinued  *Topic was postponed. The date shown is not the original due date.    Advanced directives: (Declined) Advance directive discussed with you today. Even though you declined this today, please call our office should you change your mind, and we can give you the proper paperwork for you to fill out. Advance Care Planning is important because it:  [x]  Makes sure you receive the medical care that is consistent with your  values, goals, and preferences  [x]  It provides guidance to your family and loved ones and reduces their decisional burden about whether or not they are making the right decisions based on your wishes.  Follow the link provided in your after visit summary or read over the paperwork we have mailed to you to help you started getting your Advance Directives in place. If you need assistance in completing these, please reach out to us  so that we can help you!  See attachments for Preventive Care and Fall Prevention Tips.

## 2024-09-15 ENCOUNTER — Telehealth (INDEPENDENT_AMBULATORY_CARE_PROVIDER_SITE_OTHER): Admitting: Nurse Practitioner

## 2024-09-15 ENCOUNTER — Encounter: Payer: Self-pay | Admitting: Nurse Practitioner

## 2024-09-15 ENCOUNTER — Other Ambulatory Visit: Payer: Self-pay

## 2024-09-15 DIAGNOSIS — R300 Dysuria: Secondary | ICD-10-CM

## 2024-09-15 DIAGNOSIS — N3000 Acute cystitis without hematuria: Secondary | ICD-10-CM | POA: Diagnosis not present

## 2024-09-15 LAB — URINALYSIS, ROUTINE W REFLEX MICROSCOPIC
Bilirubin, UA: NEGATIVE
Glucose, UA: NEGATIVE
Ketones, UA: NEGATIVE
Nitrite, UA: NEGATIVE
Protein,UA: NEGATIVE
Specific Gravity, UA: 1.015 (ref 1.005–1.030)
Urobilinogen, Ur: 0.2 mg/dL (ref 0.2–1.0)
pH, UA: 7.5 (ref 5.0–7.5)

## 2024-09-15 LAB — MICROSCOPIC EXAMINATION
Epithelial Cells (non renal): NONE SEEN /HPF (ref 0–10)
Renal Epithel, UA: NONE SEEN /HPF
Yeast, UA: NONE SEEN

## 2024-09-15 MED ORDER — SULFAMETHOXAZOLE-TRIMETHOPRIM 800-160 MG PO TABS: 1.0000 | ORAL_TABLET | Freq: Two times a day (BID) | ORAL | 0 refills | Status: DC

## 2024-09-15 NOTE — Progress Notes (Signed)
 Virtual Visit Consent   Shelly Bennett, you are scheduled for a virtual visit with Mary-Safari Gladis, FNP, a [facility] provider, today.     Just as with appointments in the office, your consent must be obtained to participate.  Your consent will be active for this visit and any virtual visit you may have with one of our providers in the next 365 days.     If you have a MyChart account, a copy of this consent can be sent to you electronically.  All virtual visits are billed to your insurance company just like a traditional visit in the office.    As this is a virtual visit, video technology does not allow for your provider to perform a traditional examination.  This may limit your provider's ability to fully assess your condition.  If your provider identifies any concerns that need to be evaluated in person or the need to arrange testing (such as labs, EKG, etc.), we will make arrangements to do so.     Although advances in technology are sophisticated, we cannot ensure that it will always work on either your end or our end.  If the connection with a video visit is poor, the visit may have to be switched to a telephone visit.  With either a video or telephone visit, we are not always able to ensure that we have a secure connection.     I need to obtain your verbal consent now.   Are you willing to proceed with your visit today? YES   Shelly Bennett has provided verbal consent on 09/15/2024 for a virtual visit (video or telephone).   Shelly Gladis, FNP   Date: 09/15/2024 3:02 PM   Virtual Visit via Video Note   I, Shelly Bennett, connected with Shelly Bennett ([id number], Jan 19, 1951) on 09/15/24 at  4:30 PM EDT by a video-enabled telemedicine application and verified that I am speaking with the correct person using two identifiers.  Location: Patient: Virtual Visit Location Patient: Home Provider: Virtual Visit Location Provider: Mobile   I discussed  the limitations of evaluation and management by telemedicine and the availability of in person appointments. The patient expressed understanding and agreed to proceed.    History of Present Illness: Shelly Bennett is a 73 y.o. who identifies as a female who was assigned female at birth, and is being seen today for possible UTI.  HPI: Dysuria  This is a new problem. The current episode started today. The problem occurs intermittently. The problem has been waxing and waning. The quality of the pain is described as burning. The pain is mild. There has been no fever. She is Not sexually active. There is No history of pyelonephritis. Associated symptoms include frequency, hesitancy and urgency. She has tried nothing for the symptoms. The treatment provided no relief. Her past medical history is significant for recurrent UTIs.    Review of Systems  Genitourinary:  Positive for dysuria, frequency, hesitancy and urgency.    Problems:  Patient Active Problem List   Diagnosis Date Noted   Dementia (HCC) 05/02/2023   Frequent falls 03/12/2023   Parkinsonism (HCC) 01/24/2023   Bilateral primary osteoarthritis of knee 10/04/2022   Gait abnormality 03/10/2020   Gastroesophageal reflux disease 10/19/2019   Third degree uterine prolapse 10/19/2019   Spinal stenosis of lumbar region without neurogenic claudication 09/15/2019   Urinary and fecal incontinence 09/15/2019   Cystocele with rectocele 09/15/2019   Left lumbar radiculopathy 01/22/2018   Cognitive impairment  10/22/2017   Anxiety 10/22/2017   Aortic atherosclerosis 12/04/2016   Vitamin D  deficiency 08/19/2014   Osteopenia 08/12/2013   CAD (coronary artery disease), native coronary artery 03/22/2013   Aortic valve stenosis, mild 03/22/2013   Hyperlipemia 03/11/2013   Hypertension 03/11/2013   Allergic rhinitis 03/11/2013   IBS (irritable bowel syndrome) - with chronic recurrent abdominal pain 02/01/2012   History of colonic polyps  02/01/2012    Allergies:  Allergies  Allergen Reactions   Neomycin-Polymyxin-Dexameth     Other reaction(s): eye redness   Codeine      ? Reaction ER visit.    Mold Extract [Trichophyton]     Migraine   Neomycin    Penicillins Other (See Comments)    drew my legs up as child Pt has tolerated cephalexin  in the past.   Simvastatin    Ace Inhibitors Cough   Angiotensin Receptor Blockers Cough   Levaquin  [Levofloxacin ] Rash    Per notes from outpatient provider   Vytorin [Ezetimibe-Simvastatin] Other (See Comments)    cramping   Zetia [Ezetimibe] Other (See Comments)    cramping   Medications:  Current Outpatient Medications:    amLODipine  (NORVASC ) 10 MG tablet, TAKE ONE TABLET DAILY, Disp: 90 tablet, Rfl: 3   aspirin  81 MG tablet, Take 81 mg by mouth daily. , Disp: , Rfl:    buPROPion  (WELLBUTRIN  XL) 150 MG 24 hr tablet, Take 1 tablet (150 mg total) by mouth daily. (Patient not taking: Reported on 08/14/2024), Disp: 90 tablet, Rfl: 3   calcium  carbonate (OSCAL) 1500 (600 Ca) MG TABS tablet, Take 600 mg of elemental calcium  by mouth daily., Disp: , Rfl:    Cholecalciferol (VITAMIN D -3) 5000 UNITS TABS, Take 1 capsule by mouth See admin instructions. Take one capsule by mouth daily Mon thru Friday, Disp: , Rfl:    cyanocobalamin  (VITAMIN B12) 1000 MCG tablet, Take 1,000 mcg by mouth daily., Disp: , Rfl:    diclofenac  (VOLTAREN ) 50 MG EC tablet, Take 1 tablet (50 mg total) by mouth 2 (two) times daily., Disp: 180 tablet, Rfl: 3   donepezil  (ARICEPT ) 10 MG tablet, Take 1 tablet (10 mg total) by mouth at bedtime., Disp: 90 tablet, Rfl: 3   hydrochlorothiazide  (HYDRODIURIL ) 25 MG tablet, Take 1 tablet (25 mg total) by mouth daily., Disp: 90 tablet, Rfl: 1   memantine  (NAMENDA ) 10 MG tablet, Take 1 tablet (10 mg total) by mouth 2 (two) times daily., Disp: 180 tablet, Rfl: 3   montelukast  (SINGULAIR ) 10 MG tablet, Take 1 tablet (10 mg total) by mouth at bedtime., Disp: 90 tablet, Rfl: 3    omega-3 acid ethyl esters (LOVAZA ) 1 g capsule, TAKE TWO CAPSULES TWICE DAILY, Disp: 360 capsule, Rfl: 3   polyethylene glycol (MIRALAX  / GLYCOLAX ) 17 g packet, Take 17 g by mouth 2 (two) times daily. (Patient taking differently: Take 17 g by mouth daily as needed.), Disp: 14 each, Rfl: 0   rosuvastatin  (CRESTOR ) 20 MG tablet, Take 1 tablet (20 mg total) by mouth daily., Disp: 90 tablet, Rfl: 3   senna-docusate (SENOKOT-S) 8.6-50 MG tablet, Take 1 tablet by mouth 2 (two) times daily. (Patient taking differently: Take 1 tablet by mouth daily as needed.), Disp: , Rfl:   Observations/Objective: Patient is well-developed, well-nourished in no acute distress.  Resting comfortably  at home.  Head is normocephalic, atraumatic.  No labored breathing.  Speech is clear and coherent with logical content.  Patient is alert and oriented at baseline.  No back pain Malodorous  urine UA 1+ leuks Assessment and Plan:  Shelly Bennett in today with chief complaint of No chief complaint on file.   1. Dysuria  - Urine Culture - Urinalysis, Routine w reflex microscopic  2. Acute cystitis without hematuria (Primary) Take medication as prescribe Cotton underwear Take shower not bath Cranberry juice, yogurt Force fluids AZO over the counter X2 days Culture pending RTO prn  Meds ordered this encounter  Medications   sulfamethoxazole -trimethoprim  (BACTRIM  DS) 800-160 MG tablet    Sig: Take 1 tablet by mouth 2 (two) times daily.    Dispense:  20 tablet    Refill:  0    Supervising Provider:   MARYANNE CHEW A [[id number]]      Follow Up Instructions: I discussed the assessment and treatment plan with the patient. The patient was provided an opportunity to ask questions and all were answered. The patient agreed with the plan and demonstrated an understanding of the instructions.  A copy of instructions were sent to the patient via MyChart.  The patient was advised to call back or seek an  in-person evaluation if the symptoms worsen or if the condition fails to improve as anticipated.  Time:  I spent 7 minutes with the patient via telehealth technology discussing the above problems/concerns.    Mary-Rayden Gladis, FNP

## 2024-09-15 NOTE — Patient Instructions (Signed)
 Take medication as prescribe Cotton underwear Take shower not bath Cranberry juice, yogurt Force fluids AZO over the counter X2 days Culture pending RTO prn

## 2024-09-19 LAB — URINE CULTURE

## 2024-09-21 ENCOUNTER — Ambulatory Visit: Payer: Self-pay | Admitting: Nurse Practitioner

## 2024-09-25 DIAGNOSIS — Z86007 Personal history of in-situ neoplasm of skin: Secondary | ICD-10-CM | POA: Diagnosis not present

## 2024-09-25 DIAGNOSIS — D1801 Hemangioma of skin and subcutaneous tissue: Secondary | ICD-10-CM | POA: Diagnosis not present

## 2024-09-25 DIAGNOSIS — Z872 Personal history of diseases of the skin and subcutaneous tissue: Secondary | ICD-10-CM | POA: Diagnosis not present

## 2024-09-25 DIAGNOSIS — L538 Other specified erythematous conditions: Secondary | ICD-10-CM | POA: Diagnosis not present

## 2024-09-25 DIAGNOSIS — Z08 Encounter for follow-up examination after completed treatment for malignant neoplasm: Secondary | ICD-10-CM | POA: Diagnosis not present

## 2024-09-25 DIAGNOSIS — L814 Other melanin hyperpigmentation: Secondary | ICD-10-CM | POA: Diagnosis not present

## 2024-09-25 DIAGNOSIS — L821 Other seborrheic keratosis: Secondary | ICD-10-CM | POA: Diagnosis not present

## 2024-09-25 DIAGNOSIS — L820 Inflamed seborrheic keratosis: Secondary | ICD-10-CM | POA: Diagnosis not present

## 2024-09-28 ENCOUNTER — Other Ambulatory Visit: Payer: Self-pay

## 2024-09-28 ENCOUNTER — Other Ambulatory Visit

## 2024-09-28 ENCOUNTER — Ambulatory Visit
Admission: RE | Admit: 2024-09-28 | Discharge: 2024-09-28 | Disposition: A | Discharge status: Home / Self Care | Source: Ambulatory Visit | Attending: Family Medicine | Admitting: Family Medicine

## 2024-09-28 ENCOUNTER — Ambulatory Visit (INDEPENDENT_AMBULATORY_CARE_PROVIDER_SITE_OTHER)

## 2024-09-28 ENCOUNTER — Other Ambulatory Visit: Payer: Self-pay | Admitting: Family Medicine

## 2024-09-28 DIAGNOSIS — Z0000 Encounter for general adult medical examination without abnormal findings: Secondary | ICD-10-CM

## 2024-09-28 DIAGNOSIS — N390 Urinary tract infection, site not specified: Secondary | ICD-10-CM

## 2024-09-28 DIAGNOSIS — M810 Age-related osteoporosis without current pathological fracture: Secondary | ICD-10-CM | POA: Diagnosis not present

## 2024-09-28 DIAGNOSIS — Z1231 Encounter for screening mammogram for malignant neoplasm of breast: Secondary | ICD-10-CM | POA: Diagnosis not present

## 2024-09-28 DIAGNOSIS — Z780 Asymptomatic menopausal state: Secondary | ICD-10-CM

## 2024-09-28 DIAGNOSIS — Z13820 Encounter for screening for osteoporosis: Secondary | ICD-10-CM

## 2024-09-28 LAB — URINALYSIS, ROUTINE W REFLEX MICROSCOPIC
Bilirubin, UA: NEGATIVE
Glucose, UA: NEGATIVE
Ketones, UA: NEGATIVE
Nitrite, UA: NEGATIVE
Protein,UA: NEGATIVE
Specific Gravity, UA: 1.015 (ref 1.005–1.030)
Urobilinogen, Ur: 0.2 mg/dL (ref 0.2–1.0)
pH, UA: 7 (ref 5.0–7.5)

## 2024-09-28 LAB — MICROSCOPIC EXAMINATION
Bacteria, UA: NONE SEEN
RBC, Urine: NONE SEEN /HPF (ref 0–2)
Renal Epithel, UA: NONE SEEN /HPF
Yeast, UA: NONE SEEN

## 2024-10-01 ENCOUNTER — Encounter: Payer: Self-pay | Admitting: Family Medicine

## 2024-10-01 ENCOUNTER — Other Ambulatory Visit: Payer: Self-pay

## 2024-10-01 DIAGNOSIS — N390 Urinary tract infection, site not specified: Secondary | ICD-10-CM

## 2024-10-01 DIAGNOSIS — R3129 Other microscopic hematuria: Secondary | ICD-10-CM

## 2024-10-05 ENCOUNTER — Ambulatory Visit: Payer: Self-pay | Admitting: Family Medicine

## 2024-10-05 DIAGNOSIS — M8580 Other specified disorders of bone density and structure, unspecified site: Secondary | ICD-10-CM

## 2024-10-07 ENCOUNTER — Telehealth: Payer: Self-pay

## 2024-10-07 NOTE — Progress Notes (Signed)
 Care Guide Pharmacy Note  10/07/2024 Name: Shelly Bennett MRN: [id number] DOB: 16-Oct-1951  Referred By: Maryanne Fonda LABOR, MD Reason for referral: Complex Care Management (Outreach to schedule with Pharm d )   [name] is a 73 y.o. year old female who is a primary care patient of Dettinger, Fonda LABOR, MD.  [name] was referred to the pharmacist for assistance related to: osteopenia  Successful contact was made with the patient to discuss pharmacy services including being ready for the pharmacist to call at least 5 minutes before the scheduled appointment time and to have medication bottles and any blood pressure readings ready for review. The patient agreed to meet with the pharmacist via telephone visit on (date/time).10/29/2024  Jeoffrey Buffalo , RMA     [place]  [facility] Guide  Direct Dial: [phone]  Website: [email or web address]

## 2024-10-20 ENCOUNTER — Encounter: Payer: Self-pay | Admitting: Physician Assistant

## 2024-10-20 ENCOUNTER — Other Ambulatory Visit (INDEPENDENT_AMBULATORY_CARE_PROVIDER_SITE_OTHER): Payer: Self-pay

## 2024-10-20 ENCOUNTER — Ambulatory Visit: Admitting: Physician Assistant

## 2024-10-20 DIAGNOSIS — M25552 Pain in left hip: Secondary | ICD-10-CM

## 2024-10-20 DIAGNOSIS — R2242 Localized swelling, mass and lump, left lower limb: Secondary | ICD-10-CM | POA: Diagnosis not present

## 2024-10-20 MED ORDER — DIAZEPAM 5 MG PO TABS: 5.0000 mg | ORAL_TABLET | Freq: Four times a day (QID) | ORAL | 0 refills | Status: DC | PRN

## 2024-10-20 NOTE — Progress Notes (Signed)
 Office Visit Note   Patient: Shelly Bennett           Date of Birth: 1951-09-29           MRN: [id number] Visit Date: 10/20/2024              Requested by: Dettinger, Fonda LABOR, MD [address] [place],  [place] [address] PCP: Dettinger, Fonda LABOR, MD   Assessment & Plan: Visit Diagnoses:  1. Pain in left hip   2. Mass of left hip region     Plan: Patient is a pleasant 73 year old woman who is accompanied by her husband and her daughter.  She has a 2-week history of left hip pain with weightbearing.  Did have a fall a couple weeks ago but did not think much of it.  Has not had difficulty with her hip in the past..  X-rays of the left hip do demonstrate some calcification type mass in the inferior acetabulum.  Age-indeterminate but she has had no previous x-rays of this left hip and it pain is only been for 2 weeks.  No evidence of fracture.  I reviewed the x-rays with Dr. Jerri who recommended an MRI with and without contrast I will then call them for follow-up  Follow-Up Instructions: Return if symptoms worsen or fail to improve.   Orders:  Orders Placed This Encounter  Procedures   XR HIP UNILAT W OR W/O PELVIS 1V LEFT   MR Hip Left w/ contrast   Meds ordered this encounter  Medications   diazepam (VALIUM) 5 MG tablet    Sig: Take 1 tablet (5 mg total) by mouth every 6 (six) hours as needed for anxiety. Take 1 30 minutes prior to procedure. Can repeat  x1  if needed    Dispense:  2 tablet    Refill:  0      Procedures: No procedures performed   Clinical Data: No additional findings.   Subjective: Chief Complaint  Patient presents with   Left Hip - Pain    HPI patient is a pleasant 73 year old woman comes in today with a 2-week history of left hip pain x 2 weeks.  It is painful when she is bearing weight  Review of Systems  All other systems reviewed and are negative.    Objective: Vital Signs: There were no vitals taken for this visit.  Physical  Exam Constitutional:      Appearance: Normal appearance.  Pulmonary:     Effort: Pulmonary effort is normal.     Breath sounds: Normal breath sounds.  Skin:    General: Skin is warm and dry.  Neurological:     General: No focal deficit present.     Mental Status: She is alert and oriented to person, place, and time.  Psychiatric:        Mood and Affect: Mood normal.        Behavior: Behavior normal.     Ortho Exam examination of her left hip she has no warmth no erythema no deformity.  She does have pain in the inferior groin.  No paresthesias her strength is intact no evidence of an infective process no cellulitis Specialty Comments:  No specialty comments available.  Imaging: XR HIP UNILAT W OR W/O PELVIS 1V LEFT Result Date: 10/20/2024 Radiographs of the left hip I reviewed with Dr. Jerri.  There is a mass with some ossification in the inferior acetabulum    PMFS History: Patient Active Problem List  Diagnosis Date Noted   Dementia (HCC) 05/02/2023   Frequent falls 03/12/2023   Parkinsonism (HCC) 01/24/2023   Bilateral primary osteoarthritis of knee 10/04/2022   Gait abnormality 03/10/2020   Gastroesophageal reflux disease 10/19/2019   Third degree uterine prolapse 10/19/2019   Spinal stenosis of lumbar region without neurogenic claudication 09/15/2019   Urinary and fecal incontinence 09/15/2019   Cystocele with rectocele 09/15/2019   Left lumbar radiculopathy 01/22/2018   Cognitive impairment 10/22/2017   Anxiety 10/22/2017   Aortic atherosclerosis 12/04/2016   Vitamin D  deficiency 08/19/2014   Osteopenia 08/12/2013   CAD (coronary artery disease), native coronary artery 03/22/2013   Aortic valve stenosis, mild 03/22/2013   Hyperlipemia 03/11/2013   Hypertension 03/11/2013   Allergic rhinitis 03/11/2013   IBS (irritable bowel syndrome) - with chronic recurrent abdominal pain 02/01/2012   History of colonic polyps 02/01/2012   Past Medical History:  Diagnosis  Date   Adenomatous colon polyp    Allergic drug reaction 06/25/2015   Allergy    Anxiety    Arthritis    At risk for falls    fallen 4 times recently    CAD (coronary artery disease)    Dr. Wonda follows    Carpal tunnel syndrome    Chronic headaches    Coccydynia 02/01/2012   Cystocele    Diverticulosis    External hemorrhoids    Focal nodular hyperplasia of liver    Gait abnormality    GERD (gastroesophageal reflux disease)    some   HCAP (healthcare-associated pneumonia) 06/25/2015   Heart murmur    HLD (hyperlipidemia)    HTN (hypertension)    IBS (irritable bowel syndrome)    Memory loss    Neuromuscular disorder (HCC)    some neuropathy issues in the past- legs fibromyalgia in past-- past hx of steroid injections   Obesity    Osteoporosis    Otitis of left ear 02/01/2017   Pneumonia    Skin cancer    squamous cell - face    Family History  Problem Relation Age of Onset   Hyperlipidemia Mother    Hypertension Mother    Kidney disease Mother    Osteoporosis Mother    Heart murmur Mother    Dementia Mother    Prostate cancer Father    Colon cancer Father 2   Kidney disease Father    Heart disease Father    Alzheimer's disease Father    Hyperlipidemia Sister    Hypertension Sister    Rectal cancer Maternal Grandmother 75   Heart disease Maternal Grandfather    Heart disease Brother 76       not dx questionable    Colon polyps Neg Hx    Esophageal cancer Neg Hx    Stomach cancer Neg Hx    Breast cancer Neg Hx     Past Surgical History:  Procedure Laterality Date   CARPAL TUNNEL RELEASE     COLONOSCOPY  12/15/2008   diverticulosis, external hemorrhoids   KNEE SURGERY  05/22/2015   right   LAPAROSCOPY     SKIN SURGERY     squamous cell - right face    Social History   Occupational History   Occupation: runner, broadcasting/film/video retired  Tobacco Use   Smoking status: Never   Smokeless tobacco: Never  Vaping Use   Vaping status: Never Used  Substance and Sexual  Activity   Alcohol use: No   Drug use: No   Sexual activity: Not Currently  Birth control/protection: None

## 2024-10-22 ENCOUNTER — Ambulatory Visit
Admission: RE | Admit: 2024-10-22 | Discharge: 2024-10-22 | Disposition: A | Discharge status: Home / Self Care | Source: Ambulatory Visit | Attending: Physician Assistant | Admitting: Physician Assistant

## 2024-10-22 DIAGNOSIS — M25552 Pain in left hip: Secondary | ICD-10-CM

## 2024-10-22 DIAGNOSIS — M160 Bilateral primary osteoarthritis of hip: Secondary | ICD-10-CM | POA: Diagnosis not present

## 2024-10-22 DIAGNOSIS — R2242 Localized swelling, mass and lump, left lower limb: Secondary | ICD-10-CM

## 2024-10-22 MED ORDER — GADOPICLENOL 0.5 MMOL/ML IV SOLN
8.0000 mL | Freq: Once | INTRAVENOUS | Status: AC | PRN
  Administered 2024-10-22: 8 mL via INTRAVENOUS

## 2024-10-29 ENCOUNTER — Other Ambulatory Visit: Payer: Self-pay

## 2024-11-09 ENCOUNTER — Other Ambulatory Visit: Payer: Self-pay | Admitting: Family Medicine

## 2024-11-09 DIAGNOSIS — I10 Essential (primary) hypertension: Secondary | ICD-10-CM

## 2024-11-19 ENCOUNTER — Other Ambulatory Visit

## 2024-11-19 DIAGNOSIS — E559 Vitamin D deficiency, unspecified: Secondary | ICD-10-CM

## 2024-11-19 NOTE — Progress Notes (Signed)
 11/19/2024 Name: Shelly Bennett MRN: [id number] DOB: 1951-04-22  No chief complaint on file.   Shelly Bennett is a 73 y.o. year old female who presented for a telephone visit.   They were referred to the pharmacist by their PCP for assistance in managing osteoporosis .    Subjective:  Spoke with patient and patient's daughter via telephone about treatment options for osteoporosis treatment.  Daughter reports that patient will be seeing ortho on 12/16 due to recent Hip MRI and DEXA.  She reports patient has recent hip fracture & Sacral fragility fracture.  Patient will likely warrant medication therapy for fracture treatment and prevention  Care Team: Primary Care Provider: Dettinger, Fonda LABOR, MD   Medication Access/Adherence  Current Pharmacy:  [facility] [place], [place] - [address] [place] [address] Phone: [phone] Fax: [phone]  [facility] [address], [place] - [address] [place] [address] Phone: [phone] Fax: [phone]   Patient reports affordability concerns with their medications: No  Patient reports access/transportation concerns to their pharmacy: No  Patient reports adherence concerns with their medications:  No     Osteoporosis:  Current medications: n/a Medications tried in the past:   Current supplements: vitamin D  and calcium   Current physical activity: limited  Most recent DEXA 09/28/2024:  LUMBAR SPINE (L1-L4):  BMD (in g/cm2): 1.094  T-score: -0.8  Z-score: 0.2   Rate of change from previous exam: 11.6 %   LEFT FEMORAL NECK:  BMD (in g/cm2): 0.752  T-score: -2.1  Z-score: -0.7   LEFT TOTAL HIP:  BMD (in g/cm2): 0.826  T-score: -1.4  Z-score: -0.3   RIGHT FEMORAL NECK:  BMD (in g/cm2): 0.704  T-score: -2.4  Z-score: -1.0   RIGHT TOTAL HIP:  BMD (in g/cm2): 0.839  T-score: -1.3  Z-score: -0.2   DUAL-FEMUR TOTAL MEAN:    Rate of change from previous exam: No significant rate of change from previous exam.   FRAX 10-YEAR PROBABILITY OF FRACTURE:   10-year fracture risk is performed using the [facility] FRAX calculator based on patient-reported risk factors.   Major osteoporotic fracture: 27.2% Hip fracture: 16.0%   Most recent HIP findings per ortho on 10/22/24: BONE/JOINTS: Partially evaluated T2 hyperintense signal within the left sacral ala with suggestion of curvilinear hypointense signal centrally (series 4, image 17 and series 5, image 1), suspicious for subacute/chronic nondisplaced fracture of the left sacral ala. No appreciable diastasis.   The remainder of the bones demonstrates normal marrow signal intensity. Mild osteoarthritis of the bilateral hips with joint space narrowing and marginal osteophytosis. No significant joint effusion. Degenerative changes of the visualized lower lumbar spine.   MUSCLES AND TENDONS: Partial-thickness undersurface tear of the left gluteus medius and minimus cuff insertions. Redemonstrated chronic ossification adjacent to the left ischial tuberosity measuring approximately 4.9 x 2.9 x 5.2 cm, previously 4.4 x 2.9 x 5.2 cm. This chronic ossification is associated with the origin of the left hamstring tendons, which are attached along the inferior margin with redemonstrated probable partial thickness tearing. These findings have been present since 2017 and are most compatible with sequelae of prior trauma.   SOFT TISSUES: No focal soft tissue collection or mass.   INTRAPELVIC CONTENTS: Limited images of the intrapelvic contents demonstrate sigmoid colonic diverticulosis. No enlarged lymph nodes identified in the field of view.   IMPRESSION: 1. Findings suspicious for subacute/chronic nondisplaced fracture  of the left sacral ala. 2. Redemonstrated chronic ossification adjacent to the left ischial tuberosity measuring approximately 4.9 x 2.9  x 5.2 cm, previously 4.4 x 2.9 x 5.2 cm. This chronic ossification is associated with the origin of the left hamstring tendons, which are attached along the inferior margin with redemonstrated probable partial thickness tearing. These findings have been present since 2017 and are most compatible with sequelae of prior trauma. 3. Partial-thickness undersurface tear of the left gluteus medius and minimus cuff insertion. Probable partial thickness tear of the right gluteus medius and minimus cuff insertions. Similar findings were noted on the prior exam. 4. Mild osteoarthritis of the bilateral hips.    Current medication access support: n.a   Objective:   Lab Results  Component Value Date   CREATININE 0.88 07/02/2024   BUN 18 07/02/2024   NA 142 07/02/2024   K 3.9 07/02/2024   CL 102 07/02/2024   CO2 22 07/02/2024    Lab Results  Component Value Date   CHOL 182 07/02/2024   HDL 73 07/02/2024   LDLCALC 91 07/02/2024   TRIG 102 07/02/2024   CHOLHDL 2.5 07/02/2024    Medications Reviewed Today   Medications were not reviewed in this encounter       Assessment/Plan:   Osteoporosis: - Current HIP fracture/partial tear  Findings suspicious for subacute/chronic nondisplaced fracture of the left sacral ala. - Reviewed recommendation for daily calcium  intake of 1200 mg and vitamin D  intake of [phone] units - Recommended to choose calcium  citrate formulation due to concurrent acid reflux medication - Reviewed benefits of weight bearing exercise - Recommend to consider Reclast vs Prolia based on current fracture and Major osteoporotic fracture: 27.2% Hip fracture: 16.0% Patient's daughter favors Reclast infusion due to ease and low cost of once yearly infusion.  Patient often forgets to take medications and compliance would be easier with injection/infusion Message sent to specialist to advise on future steps and therapies (seeing ortho PA next 12/16) Would send Reclast to  [facility] across from the hospital in [place]    Follow Up Plan: will await Ortho response  Mliss Tarry Griffin, PharmD, BCACP, CPP Clinical Pharmacist, [facility]

## 2024-11-25 ENCOUNTER — Ambulatory Visit: Admitting: Urology

## 2024-11-25 ENCOUNTER — Encounter: Payer: Self-pay | Admitting: Urology

## 2024-11-25 VITALS — BP 138/79 | HR 52

## 2024-11-25 DIAGNOSIS — N3281 Overactive bladder: Secondary | ICD-10-CM | POA: Diagnosis not present

## 2024-11-25 DIAGNOSIS — R3129 Other microscopic hematuria: Secondary | ICD-10-CM

## 2024-11-25 DIAGNOSIS — Z87440 Personal history of urinary (tract) infections: Secondary | ICD-10-CM | POA: Diagnosis not present

## 2024-11-25 DIAGNOSIS — N390 Urinary tract infection, site not specified: Secondary | ICD-10-CM

## 2024-11-25 LAB — BLADDER SCAN AMB NON-IMAGING: Scan Result: 1

## 2024-11-25 MED ORDER — GEMTESA 75 MG PO TABS: 1.0000 | ORAL_TABLET | Freq: Every day | ORAL | Status: DC

## 2024-11-25 NOTE — Patient Instructions (Signed)

## 2024-11-25 NOTE — Progress Notes (Unsigned)
 11/25/2024 11:15 AM   Shelly Bennett 02/23/1951 [id number]  Referring provider: Dettinger, Fonda LABOR, MD [address] [place],  [place] [address]  Urinary incontinence   HPI: Shelly Bennett is a 73yo here for evaluation of frequent UTI and urinary incontinence. She has had 3 UTIs in the past 6 months. She uses 4-5 pads during the day which are soaked and 2-3 pads at night. Her daytime incontinence she is not aware. She has poor mobility and uses a walker. She denies numbness/tingling in her fingers and toes.    PMH: Past Medical History:  Diagnosis Date   Adenomatous colon polyp    Allergic drug reaction 06/25/2015   Allergy    Anxiety    Arthritis    At risk for falls    fallen 4 times recently    CAD (coronary artery disease)    Dr. Wonda follows    Carpal tunnel syndrome    Chronic headaches    Coccydynia 02/01/2012   Cystocele    Diverticulosis    External hemorrhoids    Focal nodular hyperplasia of liver    Gait abnormality    GERD (gastroesophageal reflux disease)    some   HCAP (healthcare-associated pneumonia) 06/25/2015   Heart murmur    HLD (hyperlipidemia)    HTN (hypertension)    IBS (irritable bowel syndrome)    Memory loss    Neuromuscular disorder (HCC)    some neuropathy issues in the past- legs fibromyalgia in past-- past hx of steroid injections   Obesity    Osteoporosis    Otitis of left ear 02/01/2017   Pneumonia    Skin cancer    squamous cell - face    Surgical History: Past Surgical History:  Procedure Laterality Date   CARPAL TUNNEL RELEASE     COLONOSCOPY  12/15/2008   diverticulosis, external hemorrhoids   KNEE SURGERY  05/22/2015   right   LAPAROSCOPY     SKIN SURGERY     squamous cell - right face     Home Medications:  Allergies as of 11/25/2024       Reactions   Neomycin-polymyxin-dexameth    Other reaction(s): eye redness   Codeine     ? Reaction ER visit.    Mold Extract [trichophyton]    Migraine   Neomycin     Penicillins Other (See Comments)   drew my legs up as child Pt has tolerated cephalexin  in the past.   Simvastatin    Ace Inhibitors Cough   Angiotensin Receptor Blockers Cough   Levaquin  [levofloxacin ] Rash   Per notes from outpatient provider   Vytorin [ezetimibe-simvastatin] Other (See Comments)   cramping   Zetia [ezetimibe] Other (See Comments)   cramping        Medication List        Accurate as of November 25, 2024 11:15 AM. If you have any questions, ask your nurse or doctor.          amLODipine  10 MG tablet Commonly known as: NORVASC  TAKE ONE TABLET DAILY   aspirin  81 MG tablet Take 81 mg by mouth daily.   buPROPion  150 MG 24 hr tablet Commonly known as: WELLBUTRIN  XL Take 1 tablet (150 mg total) by mouth daily.   calcium  carbonate 1500 (600 Ca) MG Tabs tablet Commonly known as: OSCAL Take 600 mg of elemental calcium  by mouth daily.   cyanocobalamin  1000 MCG tablet Commonly known as: VITAMIN B12 Take 1,000 mcg by mouth daily.  diazepam  5 MG tablet Commonly known as: VALIUM  Take 1 tablet (5 mg total) by mouth every 6 (six) hours as needed for anxiety. Take 1 30 minutes prior to procedure. Can repeat  x1  if needed   diclofenac  50 MG EC tablet Commonly known as: VOLTAREN  Take 1 tablet (50 mg total) by mouth 2 (two) times daily.   donepezil  10 MG tablet Commonly known as: ARICEPT  Take 1 tablet (10 mg total) by mouth at bedtime.   hydrochlorothiazide  25 MG tablet Commonly known as: HYDRODIURIL  TAKE ONE TABLET ONCE DAILY   memantine  10 MG tablet Commonly known as: NAMENDA  Take 1 tablet (10 mg total) by mouth 2 (two) times daily.   montelukast  10 MG tablet Commonly known as: SINGULAIR  Take 1 tablet (10 mg total) by mouth at bedtime.   omega-3 acid ethyl esters 1 g capsule Commonly known as: LOVAZA  TAKE TWO CAPSULES TWICE DAILY   polyethylene glycol 17 g packet Commonly known as: MIRALAX  / GLYCOLAX  Take 17 g by mouth 2 (two) times  daily. What changed:  when to take this reasons to take this   rosuvastatin  20 MG tablet Commonly known as: CRESTOR  Take 1 tablet (20 mg total) by mouth daily.   senna-docusate 8.6-50 MG tablet Commonly known as: Senokot-S Take 1 tablet by mouth 2 (two) times daily. What changed:  when to take this reasons to take this   sulfamethoxazole -trimethoprim  800-160 MG tablet Commonly known as: Bactrim  DS Take 1 tablet by mouth 2 (two) times daily.   Vitamin D -3 125 MCG (5000 UT) Tabs Take 1 capsule by mouth See admin instructions. Take one capsule by mouth daily Mon thru Friday        Allergies:  Allergies  Allergen Reactions   Neomycin-Polymyxin-Dexameth     Other reaction(s): eye redness   Codeine      ? Reaction ER visit.    Mold Extract [Trichophyton]     Migraine   Neomycin    Penicillins Other (See Comments)    drew my legs up as child Pt has tolerated cephalexin  in the past.   Simvastatin    Ace Inhibitors Cough   Angiotensin Receptor Blockers Cough   Levaquin  [Levofloxacin ] Rash    Per notes from outpatient provider   Vytorin [Ezetimibe-Simvastatin] Other (See Comments)    cramping   Zetia [Ezetimibe] Other (See Comments)    cramping    Family History: Family History  Problem Relation Age of Onset   Hyperlipidemia Mother    Hypertension Mother    Kidney disease Mother    Osteoporosis Mother    Heart murmur Mother    Dementia Mother    Prostate cancer Father    Colon cancer Father 71   Kidney disease Father    Heart disease Father    Alzheimer's disease Father    Hyperlipidemia Sister    Hypertension Sister    Rectal cancer Maternal Grandmother 32   Heart disease Maternal Grandfather    Heart disease Brother 67       not dx questionable    Colon polyps Neg Hx    Esophageal cancer Neg Hx    Stomach cancer Neg Hx    Breast cancer Neg Hx     Social History:  reports that she has never smoked. She has never used smokeless tobacco. She reports  that she does not drink alcohol and does not use drugs.  ROS: All other review of systems were reviewed and are negative except what is noted above in  HPI  Physical Exam: BP 138/79   Pulse (!) 52   Constitutional:  Alert and oriented, No acute distress. HEENT: [place] AT, moist mucus membranes.  Trachea midline, no masses. Cardiovascular: No clubbing, cyanosis, or edema. Respiratory: Normal respiratory effort, no increased work of breathing. GI: Abdomen is soft, nontender, nondistended, no abdominal masses GU: No CVA tenderness.  Lymph: No cervical or inguinal lymphadenopathy. Skin: No rashes, bruises or suspicious lesions. Neurologic: Grossly intact, no focal deficits, moving all 4 extremities. Psychiatric: Normal mood and affect.  Laboratory Data: Lab Results  Component Value Date   WBC 6.9 07/02/2024   HGB 13.3 07/02/2024   HCT 41.4 07/02/2024   MCV 97 07/02/2024   PLT 284 07/02/2024    Lab Results  Component Value Date   CREATININE 0.88 07/02/2024    No results found for: PSA  No results found for: TESTOSTERONE  No results found for: HGBA1C  Urinalysis    Component Value Date/Time   COLORURINE YELLOW 06/25/2015 1628   APPEARANCEUR Clear 09/28/2024 1219   LABSPEC 1.007 06/25/2015 1628   PHURINE 7.0 06/25/2015 1628   GLUCOSEU Negative 09/28/2024 1219   HGBUR NEGATIVE 06/25/2015 1628   BILIRUBINUR Negative 09/28/2024 1219   KETONESUR NEGATIVE 06/25/2015 1628   PROTEINUR Negative 09/28/2024 1219   PROTEINUR NEGATIVE 06/25/2015 1628   UROBILINOGEN 0.2 06/25/2015 1628   NITRITE Negative 09/28/2024 1219   NITRITE NEGATIVE 06/25/2015 1628   LEUKOCYTESUR 2+ (A) 09/28/2024 1219    Lab Results  Component Value Date   LABMICR See below: 09/28/2024   WBCUA 0-5 09/28/2024   RBCUA 0-2 02/05/2019   LABEPIT 0-10 09/28/2024   MUCUS neg 06/21/2015   BACTERIA None seen 09/28/2024    Pertinent Imaging: *** No results found for this or any previous visit.  No  results found for this or any previous visit.  No results found for this or any previous visit.  No results found for this or any previous visit.  No results found for this or any previous visit.  No results found for this or any previous visit.  No results found for this or any previous visit.  No results found for this or any previous visit.   Assessment & Plan:    1. OAB -We will trial Gemtesa 75mg  daily   No follow-ups on file.  Belvie Clara, MD  [facility] [place]

## 2024-11-25 NOTE — Progress Notes (Unsigned)
   Patient cannot void prior to the bladder scan. Bladder scan result: 1  Performed By: [facility] LPN

## 2024-11-30 ENCOUNTER — Other Ambulatory Visit (HOSPITAL_COMMUNITY): Payer: Self-pay

## 2024-11-30 ENCOUNTER — Telehealth: Payer: Self-pay

## 2024-11-30 DIAGNOSIS — M8580 Other specified disorders of bone density and structure, unspecified site: Secondary | ICD-10-CM

## 2024-11-30 NOTE — Telephone Encounter (Signed)
 SABRA

## 2024-11-30 NOTE — Telephone Encounter (Deleted)
 Prolia VOB initiated via [email or web address]  Next Prolia inj DUE: NEW START   PHARMACY COPAY: $64

## 2024-11-30 NOTE — Telephone Encounter (Signed)
 Prolia VOB initiated via [email or web address]  Next Prolia inj DUE: NEW START   PHARMACY COPAY: $64

## 2024-11-30 NOTE — Telephone Encounter (Signed)
-----   Message from Mliss JONETTA Griffin sent at 11/30/2024 10:56 AM EST ----- Regarding: FW: osteoporosis [place] Would you be able to run benefits for Prolia vs. Jubbonti? We are trying to determine the most cost-effective method.  I can put in orders if needed. Thank you! ----- Message ----- From: Persons, Ronal Dragon, [place] Sent: 11/24/2024   3:38 PM EST To: Mliss JONETTA Griffin, RPH-CPP Subject: RE: osteoporosis [place]                            How about Jubbonti ----- Message ----- From: Griffin Mliss JONETTA, RPH-CPP Sent: 11/24/2024   3:11 PM EST To: Ronal Dragon Persons, PA Subject: RE: osteoporosis [place]                            Okay! Yes, I love Prolia, but it is cost-prohibitive in a lot of my Medicare patients.  She said you had discussed both options.  We talked about Prolia, but copay is $350 on [facility], so cost may be an issue.  We do a lot of Prolia here at [facility].  I will let you discuss with patient and daughter at her visit.  We can assist as needed.  Thanks! ----- Message ----- From: Persons, Ronal Dragon, [place] Sent: 11/24/2024   9:56 AM EST To: Mliss JONETTA Griffin, RPH-CPP Subject: RE: osteoporosis [place]                            I would favor Prolia, much better drug. She has high risk for fracture, high frax scores ----- Message ----- From: Griffin Mliss JONETTA, RPH-CPP Sent: 11/23/2024   7:50 PM EST To: Ronal Dragon Persons, PA Subject: osteoporosis [place]                                Hi!  We recently saw Ms. Gail/daughter re: dexa/osteoporosis treatment.  I noticed she will see you next week and you are the specialist!  We discussed all treatment options and daughter favors Reclast due to ease of once yearly infusion.  Do you want PCP to order or will you take over her treatment?  We are happy to assist as needed.  We send our infusions to [facility] infusion center which is most convenient for this patient.  Thank you for your time! Mliss, PharmD

## 2024-11-30 NOTE — Telephone Encounter (Signed)
 MEDICAL PA SUBMITTED VIA LATENT. KEY: [id number]   APPROVED

## 2024-12-01 ENCOUNTER — Encounter: Payer: Self-pay | Admitting: Physician Assistant

## 2024-12-01 ENCOUNTER — Encounter: Payer: Self-pay | Admitting: Pharmacist

## 2024-12-01 ENCOUNTER — Ambulatory Visit: Admitting: Physician Assistant

## 2024-12-01 DIAGNOSIS — S3210XD Unspecified fracture of sacrum, subsequent encounter for fracture with routine healing: Secondary | ICD-10-CM | POA: Diagnosis not present

## 2024-12-01 DIAGNOSIS — S3210XS Unspecified fracture of sacrum, sequela: Secondary | ICD-10-CM | POA: Insufficient documentation

## 2024-12-01 NOTE — Progress Notes (Signed)
 Office Visit Note   Patient: Shelly Bennett           Date of Birth: 28-Jul-1951           MRN: [id number] Visit Date: 12/01/2024              Requested by: Dettinger, Fonda LABOR, MD [address] [place],  [place] [address] PCP: Dettinger, Fonda LABOR, MD  Chief Complaint  Patient presents with   Left Hip - Follow-up      HPI: Patient follows up today for her left sacral alla fracture.  She is doing much better.  She is accompanied by her family.  Denies any paresthesias daughter says she has returned to her baseline   Assessment & Plan: Visit Diagnoses:  1. Fracture of sacrum, unspecified fracture morphology, sequela     Plan: Spoke to her daughter and her husband.  She is doing much better she still has some pain to palpation over the left sacrum.  She is getting around much better I offered physical therapy but her daughter would like to defer that for now as she is back to her baseline she does periodically do physical therapy which is ordered through their primary care  Follow-Up Instructions: Return if symptoms worsen or fail to improve.   Ortho Exam  Patient is alert, oriented, no adenopathy, well-dressed, normal affect, normal respiratory effort. Examination she sitting comfortably in a wheelchair she has negative straight leg raise no pain with manipulation of her hip she does have some tenderness over the left sacrum.  But much improved from previous visit    Imaging: No results found. No images are attached to the encounter.  Labs: Lab Results  Component Value Date   ESRSEDRATE 18 11/23/2019   LABURIC 6.3 11/23/2019   LABURIC 4.4 05/19/2018   LABURIC 5.4 01/14/2018   REPTSTATUS 06/30/2015 FINAL 06/25/2015   CULT NO GROWTH 5 DAYS 06/25/2015     Lab Results  Component Value Date   ALBUMIN 4.4 07/02/2024   ALBUMIN 4.4 12/26/2023   ALBUMIN 4.8 08/15/2023    Lab Results  Component Value Date   MG 2.2 03/13/2023   Lab Results  Component Value  Date   VD25OH 48.7 05/17/2022   VD25OH 56.7 10/19/2019   VD25OH 50.6 02/05/2019    No results found for: PREALBUMIN    Latest Ref Rng & Units 07/02/2024   10:54 AM 12/26/2023   10:51 AM 03/14/2023    8:57 AM  CBC EXTENDED  WBC 3.4 - 10.8 x10E3/uL 6.9  7.1  7.4   RBC 3.77 - 5.28 x10E6/uL 4.26  4.41  4.52   Hemoglobin 11.1 - 15.9 g/dL 86.6  86.3  86.0   HCT 34.0 - 46.6 % 41.4  42.0  42.1   Platelets 150 - 450 x10E3/uL 284  278  259   NEUT# 1.4 - 7.0 x10E3/uL 4.7  5.1  5.0   Lymph# 0.7 - 3.1 x10E3/uL 1.6  1.3  1.9      There is no height or weight on file to calculate BMI.  Orders:  No orders of the defined types were placed in this encounter.  No orders of the defined types were placed in this encounter.    Procedures: No procedures performed  Clinical Data: No additional findings.  ROS:  All other systems negative, except as noted in the HPI. Review of Systems  Objective: Vital Signs: There were no vitals taken for this visit.  Specialty Comments:  No  specialty comments available.  PMFS History: Patient Active Problem List   Diagnosis Date Noted   Fracture of sacrum, unspecified fracture morphology, sequela 12/01/2024   Dementia (HCC) 05/02/2023   Frequent falls 03/12/2023   Parkinsonism (HCC) 01/24/2023   Bilateral primary osteoarthritis of knee 10/04/2022   Gait abnormality 03/10/2020   Gastroesophageal reflux disease 10/19/2019   Third degree uterine prolapse 10/19/2019   Spinal stenosis of lumbar region without neurogenic claudication 09/15/2019   Urinary and fecal incontinence 09/15/2019   Cystocele with rectocele 09/15/2019   Left lumbar radiculopathy 01/22/2018   Cognitive impairment 10/22/2017   Anxiety 10/22/2017   Aortic atherosclerosis 12/04/2016   Vitamin D  deficiency 08/19/2014   Osteopenia 08/12/2013   CAD (coronary artery disease), native coronary artery 03/22/2013   Aortic valve stenosis, mild 03/22/2013   Hyperlipemia 03/11/2013    Hypertension 03/11/2013   Allergic rhinitis 03/11/2013   IBS (irritable bowel syndrome) - with chronic recurrent abdominal pain 02/01/2012   History of colonic polyps 02/01/2012   Past Medical History:  Diagnosis Date   Adenomatous colon polyp    Allergic drug reaction 06/25/2015   Allergy    Anxiety    Arthritis    At risk for falls    fallen 4 times recently    CAD (coronary artery disease)    Dr. Wonda follows    Carpal tunnel syndrome    Chronic headaches    Coccydynia 02/01/2012   Cystocele    Diverticulosis    External hemorrhoids    Focal nodular hyperplasia of liver    Gait abnormality    GERD (gastroesophageal reflux disease)    some   HCAP (healthcare-associated pneumonia) 06/25/2015   Heart murmur    HLD (hyperlipidemia)    HTN (hypertension)    IBS (irritable bowel syndrome)    Memory loss    Neuromuscular disorder (HCC)    some neuropathy issues in the past- legs fibromyalgia in past-- past hx of steroid injections   Obesity    Osteoporosis    Otitis of left ear 02/01/2017   Pneumonia    Skin cancer    squamous cell - face    Family History  Problem Relation Age of Onset   Hyperlipidemia Mother    Hypertension Mother    Kidney disease Mother    Osteoporosis Mother    Heart murmur Mother    Dementia Mother    Prostate cancer Father    Colon cancer Father 79   Kidney disease Father    Heart disease Father    Alzheimer's disease Father    Hyperlipidemia Sister    Hypertension Sister    Rectal cancer Maternal Grandmother 62   Heart disease Maternal Grandfather    Heart disease Brother 32       not dx questionable    Colon polyps Neg Hx    Esophageal cancer Neg Hx    Stomach cancer Neg Hx    Breast cancer Neg Hx     Past Surgical History:  Procedure Laterality Date   CARPAL TUNNEL RELEASE     COLONOSCOPY  12/15/2008   diverticulosis, external hemorrhoids   KNEE SURGERY  05/22/2015   right   LAPAROSCOPY     SKIN SURGERY     squamous cell -  right face    Social History   Occupational History   Occupation: runner, broadcasting/film/video retired  Tobacco Use   Smoking status: Never   Smokeless tobacco: Never  Vaping Use   Vaping status: Never Used  Substance and Sexual Activity   Alcohol use: No   Drug use: No   Sexual activity: Not Currently    Birth control/protection: None

## 2024-12-02 NOTE — Telephone Encounter (Signed)
 Pt ready for scheduling for PROLIA on or after : 12/02/24  Option# 1: Buy/Bill (Office supplied medication)  Out-of-pocket cost due at time of clinic visit: $372  Number of injection/visits approved: 2  Primary: [employer] Prolia co-insurance: *UNDISCLOSED* (Following Medicare Advantage guideline: 20% coinsurance) Admin fee co-insurance: $40  Secondary: --- Prolia co-insurance:  Admin fee co-insurance:   Medical Benefit Details: Date Benefits were checked: 11/30/24 Deductible: NO/ Coinsurance: 20%/ Admin Fee: $40  Prior Auth: APPROVED PA# [id number] Expiration Date: 11/30/24-12/16/25   # of doses approved: 2 ----------------------------------------------------------------------- Option# 2- Med Obtained from pharmacy:  Pharmacy benefit: Copay $64 (Paid to pharmacy) Admin Fee: $40 (Pay at clinic)  Prior Auth: N/A PA# Expiration Date:   # of doses approved:   If patient wants fill through the pharmacy benefit please send prescription to: [facility], and include estimated need by date in rx notes. Pharmacy will ship medication directly to the office.  Patient NOT eligible for Prolia Copay Card. Copay Card can make patient's cost as little as $25. Link to apply: [email or web address]://www.amgensupportplus.com/copay  ** This summary of benefits is an estimation of the patient's out-of-pocket cost. Exact cost may very based on individual plan coverage.

## 2024-12-09 ENCOUNTER — Other Ambulatory Visit: Payer: Self-pay

## 2024-12-09 MED ORDER — DENOSUMAB 60 MG/ML ~~LOC~~ SOSY
60.0000 mg | PREFILLED_SYRINGE | Freq: Once | SUBCUTANEOUS | 0 refills | Status: AC
  Filled 2024-12-15: qty 1, 180d supply, fill #0

## 2024-12-09 NOTE — Telephone Encounter (Signed)
 Daughter aware of cost will sche appt after med arrives and after holidays

## 2024-12-09 NOTE — Progress Notes (Signed)
 See benefits inv encounter from 11/30/24. Copay is $64.

## 2024-12-09 NOTE — Addendum Note (Signed)
 Addended by: BRANDY SETTER D on: 12/09/2024 12:52 PM   Modules accepted: Orders

## 2024-12-11 ENCOUNTER — Other Ambulatory Visit: Payer: Self-pay

## 2024-12-12 ENCOUNTER — Encounter: Payer: Self-pay | Admitting: Urology

## 2024-12-13 MED ORDER — GEMTESA 75 MG PO TABS: 75.0000 mg | ORAL_TABLET | Freq: Every day | ORAL | 3 refills | Status: AC

## 2024-12-15 ENCOUNTER — Other Ambulatory Visit: Payer: Self-pay

## 2024-12-15 ENCOUNTER — Telehealth: Payer: Self-pay

## 2024-12-15 NOTE — Progress Notes (Signed)
 Specialty Pharmacy Initial Fill Coordination Note  Shelly Bennett is a 73 y.o. female contacted today regarding initial fill of specialty medication(s) Denosumab  (PROLIA )   Patient requested Courier to Provider Office   Delivery date: 12/21/24   Verified address: [facility]-[address]   Medication will be filled on: 12/16/24   Patient is aware of $64 copayment.

## 2024-12-15 NOTE — Telephone Encounter (Signed)
 Requested rx sent on 12/28 to requested pharmacy. Patient made aware.

## 2024-12-15 NOTE — Telephone Encounter (Signed)
 Patient's husband said the Gemtesa  is helping patient's issues.  Only has 1 bottle left.  Needing samples or Rx called into [facility] [place], [place] - [address] Phone: [phone]  Fax: [phone]

## 2024-12-16 ENCOUNTER — Other Ambulatory Visit: Payer: Self-pay

## 2024-12-18 ENCOUNTER — Other Ambulatory Visit: Payer: Self-pay

## 2024-12-24 ENCOUNTER — Encounter: Payer: Self-pay | Admitting: Cardiovascular Disease

## 2025-01-07 ENCOUNTER — Encounter: Payer: Self-pay | Admitting: Family Medicine

## 2025-01-07 ENCOUNTER — Other Ambulatory Visit: Payer: Self-pay

## 2025-01-07 ENCOUNTER — Ambulatory Visit: Payer: Self-pay | Admitting: Family Medicine

## 2025-01-07 VITALS — BP 134/72 | HR 57 | Ht 68.0 in | Wt 190.0 lb

## 2025-01-07 DIAGNOSIS — M810 Age-related osteoporosis without current pathological fracture: Secondary | ICD-10-CM

## 2025-01-07 DIAGNOSIS — R4189 Other symptoms and signs involving cognitive functions and awareness: Secondary | ICD-10-CM

## 2025-01-07 DIAGNOSIS — G309 Alzheimer's disease, unspecified: Secondary | ICD-10-CM

## 2025-01-07 DIAGNOSIS — Q2381 Bicuspid aortic valve: Secondary | ICD-10-CM

## 2025-01-07 DIAGNOSIS — R300 Dysuria: Secondary | ICD-10-CM

## 2025-01-07 DIAGNOSIS — I10 Essential (primary) hypertension: Secondary | ICD-10-CM | POA: Diagnosis not present

## 2025-01-07 DIAGNOSIS — E782 Mixed hyperlipidemia: Secondary | ICD-10-CM

## 2025-01-07 DIAGNOSIS — I2510 Atherosclerotic heart disease of native coronary artery without angina pectoris: Secondary | ICD-10-CM

## 2025-01-07 DIAGNOSIS — F02B0 Dementia in other diseases classified elsewhere, moderate, without behavioral disturbance, psychotic disturbance, mood disturbance, and anxiety: Secondary | ICD-10-CM | POA: Diagnosis not present

## 2025-01-07 DIAGNOSIS — E559 Vitamin D deficiency, unspecified: Secondary | ICD-10-CM

## 2025-01-07 DIAGNOSIS — G3184 Mild cognitive impairment, so stated: Secondary | ICD-10-CM

## 2025-01-07 LAB — LIPID PANEL

## 2025-01-07 MED ORDER — HYDROCHLOROTHIAZIDE 25 MG PO TABS: 25.0000 mg | ORAL_TABLET | Freq: Every day | ORAL | 3 refills | Status: AC

## 2025-01-07 MED ORDER — OMEGA-3-ACID ETHYL ESTERS 1 G PO CAPS: ORAL_CAPSULE | ORAL | 3 refills | Status: AC

## 2025-01-07 MED ORDER — AMLODIPINE BESYLATE 10 MG PO TABS: 10.0000 mg | ORAL_TABLET | Freq: Every day | ORAL | 3 refills | Status: AC

## 2025-01-07 MED ORDER — DENOSUMAB 60 MG/ML ~~LOC~~ SOSY
60.0000 mg | PREFILLED_SYRINGE | SUBCUTANEOUS | Status: AC
  Administered 2025-01-07: 60 mg via SUBCUTANEOUS

## 2025-01-07 MED ORDER — DONEPEZIL HCL 10 MG PO TABS: 10.0000 mg | ORAL_TABLET | Freq: Every day | ORAL | 3 refills | Status: AC

## 2025-01-07 NOTE — Addendum Note (Signed)
 Addended by: LEIGH ROSINA SAILOR on: 01/07/2025 11:42 AM   Modules accepted: Orders

## 2025-01-07 NOTE — Progress Notes (Signed)
 "  BP 134/72   Pulse (!) 57   Ht 5' 8 (1.727 m)   Wt 190 lb (86.2 kg)   SpO2 98%   BMI 28.89 kg/m    Subjective:   Patient ID: Shelly Bennett, female    DOB: 03-06-1951, 74 y.o.   MRN: [id number]  HPI: Shelly Bennett is a 74 y.o. female presenting on 01/07/2025 for Medical Management of Chronic Issues, Hyperlipidemia, Hypertension, and Neurodegenerative Disorder   Discussed the use of AI scribe software for clinical note transcription with the patient, who gave verbal consent to proceed.  History of Present Illness   Shelly Bennett is a 74 year old female with Alzheimer's disease who presents for follow-up on her current medications and symptoms.  Cognitive impairment and alzheimer's disease - Diagnosed with Alzheimer's disease following spinal tap and diagnostic workup at [facility], which ruled out Parkinson's disease. - Currently taking Aricept  and Namenda  for memory impairment. - Requires assistance with daily medication regimen; daughter prepares pills in advance. - Participates in daily activities such as unloading the dishwasher and taking clothes to the laundry room, but sometimes stops mid-task. - Does not use smartphone for reminders; daughter manages schedule and activities.  Mobility impairment - Uses a walker at home. - Ambulates very slowly. - No specific damage identified to explain mobility issues according to prior workup.  Urinary symptoms - Started Gemtesa  in December; nearly one month since initiation. - Decrease in urinary frequency since starting Gemtesa , as evidenced by reduced use of Depends. - Experiencing dysuria since starting Gemtesa .  Hyperlipidemia - Taking medication for hyperlipidemia and fish oils (Lovaza ). - Doing well on current regimen.  Mood and anxiety symptoms - Taking Wellbutrin  for anxiety and mood stabilization.          Relevant past medical, surgical, family and social history reviewed and updated as  indicated. Interim medical history since our last visit reviewed. Allergies and medications reviewed and updated.  Review of Systems  Constitutional:  Negative for chills and fever.  HENT:  Negative for congestion, ear discharge and ear pain.   Eyes:  Negative for redness and visual disturbance.  Respiratory:  Negative for chest tightness and shortness of breath.   Cardiovascular:  Negative for chest pain and leg swelling.  Genitourinary:  Negative for difficulty urinating and dysuria.  Musculoskeletal:  Positive for gait problem. Negative for back pain.  Skin:  Negative for rash.  Neurological:  Positive for weakness. Negative for light-headedness and headaches.  Psychiatric/Behavioral:  Positive for confusion and decreased concentration. Negative for agitation, behavioral problems, self-injury, sleep disturbance and suicidal ideas. The patient is not nervous/anxious.   All other systems reviewed and are negative.   Per HPI unless specifically indicated above   Allergies as of 01/07/2025       Reactions   Neomycin-polymyxin-dexameth    Other reaction(s): eye redness   Codeine     ? Reaction ER visit.    Mold Extract [trichophyton]    Migraine   Neomycin    Penicillins Other (See Comments)   drew my legs up as child Pt has tolerated cephalexin  in the past.   Simvastatin    Ace Inhibitors Cough   Angiotensin Receptor Blockers Cough   Levaquin  [levofloxacin ] Rash   Per notes from outpatient provider   Vytorin [ezetimibe-simvastatin] Other (See Comments)   cramping   Zetia [ezetimibe] Other (See Comments)   cramping        Medication List  Accurate as of January 07, 2025 11:08 AM. If you have any questions, ask your nurse or doctor.          STOP taking these medications    diazepam  5 MG tablet Commonly known as: VALIUM  Stopped by: Fonda Levins, MD   sulfamethoxazole -trimethoprim  800-160 MG tablet Commonly known as: Bactrim  DS Stopped by: Fonda Levins, MD       TAKE these medications    amLODipine  10 MG tablet Commonly known as: NORVASC  Take 1 tablet (10 mg total) by mouth daily.   aspirin  81 MG tablet Take 81 mg by mouth daily.   buPROPion  150 MG 24 hr tablet Commonly known as: WELLBUTRIN  XL Take 1 tablet (150 mg total) by mouth daily.   calcium  carbonate 1500 (600 Ca) MG Tabs tablet Commonly known as: OSCAL Take 600 mg of elemental calcium  by mouth daily.   cyanocobalamin  1000 MCG tablet Commonly known as: VITAMIN B12 Take 1,000 mcg by mouth daily.   diclofenac  50 MG EC tablet Commonly known as: VOLTAREN  Take 1 tablet (50 mg total) by mouth 2 (two) times daily.   donepezil  10 MG tablet Commonly known as: ARICEPT  Take 1 tablet (10 mg total) by mouth at bedtime.   Gemtesa  75 MG Tabs Generic drug: Vibegron  Take 1 tablet (75 mg total) by mouth daily.   hydrochlorothiazide  25 MG tablet Commonly known as: HYDRODIURIL  Take 1 tablet (25 mg total) by mouth daily.   memantine  10 MG tablet Commonly known as: NAMENDA  Take 1 tablet (10 mg total) by mouth 2 (two) times daily.   montelukast  10 MG tablet Commonly known as: SINGULAIR  Take 1 tablet (10 mg total) by mouth at bedtime.   omega-3 acid ethyl esters 1 g capsule Commonly known as: LOVAZA  TAKE TWO CAPSULES TWICE DAILY   polyethylene glycol 17 g packet Commonly known as: MIRALAX  / GLYCOLAX  Take 17 g by mouth 2 (two) times daily. What changed:  when to take this reasons to take this   rosuvastatin  20 MG tablet Commonly known as: CRESTOR  Take 1 tablet (20 mg total) by mouth daily.   senna-docusate 8.6-50 MG tablet Commonly known as: Senokot-S Take 1 tablet by mouth 2 (two) times daily. What changed:  when to take this reasons to take this   Vitamin D -3 125 MCG (5000 UT) Tabs Take 1 capsule by mouth See admin instructions. Take one capsule by mouth daily Mon thru Friday         Objective:   BP 134/72   Pulse (!) 57   Ht 5' 8  (1.727 m)   Wt 190 lb (86.2 kg)   SpO2 98%   BMI 28.89 kg/m   Wt Readings from Last 3 Encounters:  01/07/25 190 lb (86.2 kg)  08/14/24 192 lb (87.1 kg)  07/17/24 192 lb 9.6 oz (87.4 kg)    Physical Exam Physical Exam   VITALS: BP- 134/72 NECK: Thyroid  normal on palpation. CARDIOVASCULAR: Regular rhythm, systolic murmur present.       Results for orders placed or performed in visit on 11/25/24  BLADDER SCAN AMB NON-IMAGING   Collection Time: 11/25/24 11:08 AM  Result Value Ref Range   Scan Result 1     Assessment & Plan:   Problem List Items Addressed This Visit       Cardiovascular and Mediastinum   Hypertension - Primary   Relevant Medications   amLODipine  (NORVASC ) 10 MG tablet   hydrochlorothiazide  (HYDRODIURIL ) 25 MG tablet   omega-3 acid ethyl esters (LOVAZA )  1 g capsule   Other Relevant Orders   CBC With Diff/Platelet   CMP14+EGFR   Lipid panel   CAD (coronary artery disease), native coronary artery   Relevant Medications   amLODipine  (NORVASC ) 10 MG tablet   hydrochlorothiazide  (HYDRODIURIL ) 25 MG tablet   omega-3 acid ethyl esters (LOVAZA ) 1 g capsule     Nervous and Auditory   Dementia (HCC)   Relevant Medications   donepezil  (ARICEPT ) 10 MG tablet     Musculoskeletal and Integument   Osteoporosis     Other   Hyperlipemia   Relevant Medications   amLODipine  (NORVASC ) 10 MG tablet   hydrochlorothiazide  (HYDRODIURIL ) 25 MG tablet   omega-3 acid ethyl esters (LOVAZA ) 1 g capsule   Other Relevant Orders   CBC With Diff/Platelet   CMP14+EGFR   Lipid panel   Cognitive impairment   Relevant Medications   donepezil  (ARICEPT ) 10 MG tablet   Other Visit Diagnoses       Bicuspid aortic valve       Relevant Medications   amLODipine  (NORVASC ) 10 MG tablet   hydrochlorothiazide  (HYDRODIURIL ) 25 MG tablet   omega-3 acid ethyl esters (LOVAZA ) 1 g capsule     Mild cognitive impairment       Relevant Medications   donepezil  (ARICEPT ) 10 MG tablet      Essential (primary) hypertension       Relevant Medications   amLODipine  (NORVASC ) 10 MG tablet   hydrochlorothiazide  (HYDRODIURIL ) 25 MG tablet   omega-3 acid ethyl esters (LOVAZA ) 1 g capsule   Other Relevant Orders   CBC With Diff/Platelet   CMP14+EGFR   Lipid panel           Age-related osteoporosis Initiated Prolia  for osteoporosis management.  Alzheimer's disease Diagnosis confirmed by [facility]. No Parkinson's disease. Condition plateauing with Aricept  and Namenda . Mobility issues persist without specific cause. - Continue Aricept  and Namenda . - Encouraged daily activities for cognitive maintenance. - Consider smartphone reminders for tasks.  Mixed hyperlipidemia Continues on cholesterol medication and Lovaza  without issues. - Continue current cholesterol medication and Lovaza .  Urinary incontinence Improvement with Gemtestza, but burning sensation noted. - Collect urine sample if burning persists.  Anxiety disorder Managed with Wellbutrin . Mood stable, reduced irritability. - Continue Wellbutrin .          Follow up plan: Return in about 6 months (around 07/07/2025), or if symptoms worsen or fail to improve, for Hypertension hyperlipidemia.  Counseling provided for all of the vaccine components Orders Placed This Encounter  Procedures   CBC With Diff/Platelet   CMP14+EGFR   Lipid panel    Fonda Levins, MD [facility] 01/07/2025, 11:08 AM     "

## 2025-01-08 LAB — CMP14+EGFR
ALT: 13 IU/L (ref 0–32)
AST: 16 IU/L (ref 0–40)
Albumin: 4.4 g/dL (ref 3.8–4.8)
Alkaline Phosphatase: 77 IU/L (ref 49–135)
BUN/Creatinine Ratio: 19 (ref 12–28)
BUN: 18 mg/dL (ref 8–27)
Bilirubin Total: 0.4 mg/dL (ref 0.0–1.2)
CO2: 23 mmol/L (ref 20–29)
Calcium: 10.1 mg/dL (ref 8.7–10.3)
Chloride: 103 mmol/L (ref 96–106)
Creatinine, Ser: 0.93 mg/dL (ref 0.57–1.00)
Globulin, Total: 2.2 g/dL (ref 1.5–4.5)
Glucose: 81 mg/dL (ref 70–99)
Potassium: 4.4 mmol/L (ref 3.5–5.2)
Sodium: 141 mmol/L (ref 134–144)
Total Protein: 6.6 g/dL (ref 6.0–8.5)
eGFR: 64 mL/min/1.73

## 2025-01-08 LAB — LIPID PANEL
Cholesterol, Total: 171 mg/dL (ref 100–199)
HDL: 72 mg/dL
LDL CALC COMMENT:: 2.4 ratio (ref 0.0–4.4)
LDL Chol Calc (NIH): 78 mg/dL (ref 0–99)
Triglycerides: 123 mg/dL (ref 0–149)
VLDL Cholesterol Cal: 21 mg/dL (ref 5–40)

## 2025-01-08 LAB — CBC WITH DIFF/PLATELET
Basophils Absolute: 0 x10E3/uL (ref 0.0–0.2)
Basos: 1 %
EOS (ABSOLUTE): 0.1 x10E3/uL (ref 0.0–0.4)
Eos: 1 %
Hematocrit: 40.4 % (ref 34.0–46.6)
Hemoglobin: 13 g/dL (ref 11.1–15.9)
Immature Grans (Abs): 0 x10E3/uL (ref 0.0–0.1)
Immature Granulocytes: 0 %
Lymphocytes Absolute: 1.6 x10E3/uL (ref 0.7–3.1)
Lymphs: 22 %
MCH: 30.7 pg (ref 26.6–33.0)
MCHC: 32.2 g/dL (ref 31.5–35.7)
MCV: 96 fL (ref 79–97)
Monocytes Absolute: 0.5 x10E3/uL (ref 0.1–0.9)
Monocytes: 7 %
Neutrophils Absolute: 5 x10E3/uL (ref 1.4–7.0)
Neutrophils: 69 %
Platelets: 282 x10E3/uL (ref 150–450)
RBC: 4.23 x10E6/uL (ref 3.77–5.28)
RDW: 12.8 % (ref 11.7–15.4)
WBC: 7.3 x10E3/uL (ref 3.4–10.8)

## 2025-01-08 LAB — VITAMIN D 25 HYDROXY (VIT D DEFICIENCY, FRACTURES): Vit D, 25-Hydroxy: 48.4 ng/mL (ref 30.0–100.0)

## 2025-01-14 ENCOUNTER — Ambulatory Visit: Payer: Self-pay | Admitting: Family Medicine

## 2025-01-18 ENCOUNTER — Telehealth: Payer: Self-pay

## 2025-01-18 NOTE — Telephone Encounter (Signed)
 Prolia  VOB initiated via [email or web address]  Next Prolia  inj DUE: NEW START

## 2025-01-20 ENCOUNTER — Other Ambulatory Visit (HOSPITAL_COMMUNITY): Payer: Self-pay

## 2025-01-20 NOTE — Telephone Encounter (Signed)
" ° °  [employer] cannot give estimate, following Medicare Advantage guideline (20% coinsurance, 20% admin fee)   "

## 2025-01-20 NOTE — Telephone Encounter (Signed)
 SABRA

## 2025-01-20 NOTE — Telephone Encounter (Signed)
 Pt ready for scheduling for PROLIA  on or after : 01/20/25  Option# 1: Buy/Bill (Office supplied medication)  Out-of-pocket cost due at time of clinic visit: $377  Number of injection/visits approved: 2  Primary: [employer] Prolia  co-insurance: 20% Admin fee co-insurance: 20%  Secondary: --- Prolia  co-insurance:  Admin fee co-insurance:   Medical Benefit Details: Date Benefits were checked: 01/19/25 Deductible: NO/ Coinsurance: 20%/ Admin Fee: 20%  Prior Auth: APPROVED PA# [id number] Expiration Date: 11/30/24-12/16/25   # of doses approved: 2 ----------------------------------------------------------------------- Option# 2- Med Obtained from pharmacy:  Pharmacy benefit: Copay $64 (Paid to pharmacy) Admin Fee: 20% (Pay at clinic)  Prior Auth: N/A PA# Expiration Date:   # of doses approved:   If patient wants fill through the pharmacy benefit please send prescription to: [facility], and include estimated need by date in rx notes. Pharmacy will ship medication directly to the office.  Patient NOT eligible for Prolia  Copay Card. Copay Card can make patient's cost as little as $25. Link to apply: [email or web address]://[email or web address]  ** This summary of benefits is an estimation of the patient's out-of-pocket cost. Exact cost may very based on individual plan coverage.

## 2025-01-21 ENCOUNTER — Telehealth: Payer: Self-pay

## 2025-01-21 NOTE — Telephone Encounter (Signed)
 Per Dr. Wonda, the patient is due for 1 year visit. Scheduled her 4/21.

## 2025-04-06 ENCOUNTER — Ambulatory Visit: Admitting: Cardiovascular Disease

## 2025-05-28 ENCOUNTER — Ambulatory Visit: Admitting: Urology

## 2025-07-22 ENCOUNTER — Ambulatory Visit: Admitting: Family Medicine

## 2025-08-17 ENCOUNTER — Ambulatory Visit: Payer: Self-pay
